# Patient Record
Sex: Female | Born: 1950
Health system: Southern US, Community
[De-identification: ages and names within clinical notes are randomized; demographics above are authoritative.]

## PROBLEM LIST (undated history)

## (undated) DIAGNOSIS — F32A Depression, unspecified: Secondary | ICD-10-CM

## (undated) DIAGNOSIS — J449 Chronic obstructive pulmonary disease, unspecified: Secondary | ICD-10-CM

## (undated) DIAGNOSIS — J189 Pneumonia, unspecified organism: Secondary | ICD-10-CM

## (undated) DIAGNOSIS — I219 Acute myocardial infarction, unspecified: Secondary | ICD-10-CM

## (undated) DIAGNOSIS — R51 Headache: Secondary | ICD-10-CM

## (undated) DIAGNOSIS — I10 Essential (primary) hypertension: Secondary | ICD-10-CM

## (undated) DIAGNOSIS — R519 Headache, unspecified: Secondary | ICD-10-CM

## (undated) DIAGNOSIS — F329 Major depressive disorder, single episode, unspecified: Secondary | ICD-10-CM

## (undated) DIAGNOSIS — IMO0001 Reserved for inherently not codable concepts without codable children: Secondary | ICD-10-CM

## (undated) DIAGNOSIS — Z8719 Personal history of other diseases of the digestive system: Secondary | ICD-10-CM

## (undated) DIAGNOSIS — K219 Gastro-esophageal reflux disease without esophagitis: Secondary | ICD-10-CM

## (undated) DIAGNOSIS — F411 Generalized anxiety disorder: Secondary | ICD-10-CM

## (undated) DIAGNOSIS — D649 Anemia, unspecified: Secondary | ICD-10-CM

## (undated) DIAGNOSIS — I509 Heart failure, unspecified: Secondary | ICD-10-CM

## (undated) HISTORY — PX: CARDIAC CATHETERIZATION: SHX172

## (undated) HISTORY — PX: OTHER SURGICAL HISTORY: SHX169

---

## 2014-11-26 DIAGNOSIS — J441 Chronic obstructive pulmonary disease with (acute) exacerbation: Secondary | ICD-10-CM | POA: Diagnosis not present

## 2014-11-26 DIAGNOSIS — I214 Non-ST elevation (NSTEMI) myocardial infarction: Secondary | ICD-10-CM | POA: Diagnosis not present

## 2014-12-10 DIAGNOSIS — Z79899 Other long term (current) drug therapy: Secondary | ICD-10-CM | POA: Diagnosis not present

## 2014-12-10 DIAGNOSIS — R109 Unspecified abdominal pain: Secondary | ICD-10-CM | POA: Diagnosis not present

## 2014-12-10 DIAGNOSIS — R51 Headache: Secondary | ICD-10-CM | POA: Diagnosis not present

## 2014-12-10 DIAGNOSIS — T07 Unspecified multiple injuries: Secondary | ICD-10-CM | POA: Diagnosis not present

## 2014-12-10 DIAGNOSIS — R21 Rash and other nonspecific skin eruption: Secondary | ICD-10-CM | POA: Diagnosis not present

## 2014-12-10 DIAGNOSIS — Z5181 Encounter for therapeutic drug level monitoring: Secondary | ICD-10-CM | POA: Diagnosis not present

## 2014-12-10 DIAGNOSIS — Z043 Encounter for examination and observation following other accident: Secondary | ICD-10-CM | POA: Diagnosis not present

## 2014-12-10 DIAGNOSIS — R1030 Lower abdominal pain, unspecified: Secondary | ICD-10-CM | POA: Diagnosis not present

## 2014-12-10 DIAGNOSIS — T797XXA Traumatic subcutaneous emphysema, initial encounter: Secondary | ICD-10-CM | POA: Diagnosis not present

## 2014-12-10 DIAGNOSIS — I252 Old myocardial infarction: Secondary | ICD-10-CM | POA: Diagnosis not present

## 2014-12-10 DIAGNOSIS — M542 Cervicalgia: Secondary | ICD-10-CM | POA: Diagnosis not present

## 2014-12-10 DIAGNOSIS — Z7982 Long term (current) use of aspirin: Secondary | ICD-10-CM | POA: Diagnosis not present

## 2014-12-10 DIAGNOSIS — R55 Syncope and collapse: Secondary | ICD-10-CM | POA: Diagnosis not present

## 2014-12-10 DIAGNOSIS — R918 Other nonspecific abnormal finding of lung field: Secondary | ICD-10-CM | POA: Diagnosis not present

## 2014-12-10 DIAGNOSIS — T1490 Injury, unspecified: Secondary | ICD-10-CM | POA: Diagnosis not present

## 2014-12-10 DIAGNOSIS — J9611 Chronic respiratory failure with hypoxia: Secondary | ICD-10-CM | POA: Diagnosis not present

## 2014-12-10 DIAGNOSIS — J439 Emphysema, unspecified: Secondary | ICD-10-CM | POA: Diagnosis not present

## 2014-12-11 DIAGNOSIS — T797XXA Traumatic subcutaneous emphysema, initial encounter: Secondary | ICD-10-CM | POA: Diagnosis not present

## 2014-12-11 DIAGNOSIS — R1030 Lower abdominal pain, unspecified: Secondary | ICD-10-CM | POA: Diagnosis not present

## 2014-12-11 DIAGNOSIS — Z043 Encounter for examination and observation following other accident: Secondary | ICD-10-CM | POA: Diagnosis not present

## 2014-12-11 DIAGNOSIS — R51 Headache: Secondary | ICD-10-CM | POA: Diagnosis not present

## 2014-12-11 DIAGNOSIS — T1490 Injury, unspecified: Secondary | ICD-10-CM | POA: Diagnosis not present

## 2014-12-11 DIAGNOSIS — R918 Other nonspecific abnormal finding of lung field: Secondary | ICD-10-CM | POA: Diagnosis not present

## 2014-12-11 DIAGNOSIS — R55 Syncope and collapse: Secondary | ICD-10-CM | POA: Diagnosis not present

## 2014-12-11 DIAGNOSIS — M542 Cervicalgia: Secondary | ICD-10-CM | POA: Diagnosis not present

## 2014-12-20 DIAGNOSIS — Z043 Encounter for examination and observation following other accident: Secondary | ICD-10-CM | POA: Diagnosis not present

## 2014-12-21 DIAGNOSIS — Z043 Encounter for examination and observation following other accident: Secondary | ICD-10-CM | POA: Diagnosis not present

## 2015-01-01 DIAGNOSIS — J432 Centrilobular emphysema: Secondary | ICD-10-CM | POA: Diagnosis not present

## 2015-01-01 DIAGNOSIS — F419 Anxiety disorder, unspecified: Secondary | ICD-10-CM | POA: Diagnosis not present

## 2015-01-01 DIAGNOSIS — J9611 Chronic respiratory failure with hypoxia: Secondary | ICD-10-CM | POA: Diagnosis not present

## 2015-01-01 DIAGNOSIS — J449 Chronic obstructive pulmonary disease, unspecified: Secondary | ICD-10-CM | POA: Diagnosis not present

## 2015-01-01 DIAGNOSIS — Z720 Tobacco use: Secondary | ICD-10-CM | POA: Diagnosis not present

## 2015-01-03 DIAGNOSIS — Z79899 Other long term (current) drug therapy: Secondary | ICD-10-CM | POA: Diagnosis not present

## 2015-01-03 DIAGNOSIS — R918 Other nonspecific abnormal finding of lung field: Secondary | ICD-10-CM | POA: Diagnosis not present

## 2015-01-03 DIAGNOSIS — Z8659 Personal history of other mental and behavioral disorders: Secondary | ICD-10-CM | POA: Diagnosis not present

## 2015-01-03 DIAGNOSIS — F515 Nightmare disorder: Secondary | ICD-10-CM | POA: Diagnosis not present

## 2015-01-03 DIAGNOSIS — J449 Chronic obstructive pulmonary disease, unspecified: Secondary | ICD-10-CM | POA: Diagnosis not present

## 2015-01-03 DIAGNOSIS — Z780 Asymptomatic menopausal state: Secondary | ICD-10-CM | POA: Diagnosis not present

## 2015-01-03 DIAGNOSIS — R636 Underweight: Secondary | ICD-10-CM | POA: Diagnosis not present

## 2015-01-15 DIAGNOSIS — Z720 Tobacco use: Secondary | ICD-10-CM | POA: Diagnosis not present

## 2015-01-15 DIAGNOSIS — Z79899 Other long term (current) drug therapy: Secondary | ICD-10-CM | POA: Diagnosis not present

## 2015-01-15 DIAGNOSIS — R918 Other nonspecific abnormal finding of lung field: Secondary | ICD-10-CM | POA: Diagnosis not present

## 2015-01-15 DIAGNOSIS — C3480 Malignant neoplasm of overlapping sites of unspecified bronchus and lung: Secondary | ICD-10-CM | POA: Diagnosis not present

## 2015-01-15 DIAGNOSIS — J449 Chronic obstructive pulmonary disease, unspecified: Secondary | ICD-10-CM | POA: Diagnosis not present

## 2015-01-28 DIAGNOSIS — J9611 Chronic respiratory failure with hypoxia: Secondary | ICD-10-CM | POA: Diagnosis present

## 2015-01-28 DIAGNOSIS — Z87891 Personal history of nicotine dependence: Secondary | ICD-10-CM | POA: Diagnosis not present

## 2015-01-28 DIAGNOSIS — J449 Chronic obstructive pulmonary disease, unspecified: Secondary | ICD-10-CM | POA: Diagnosis not present

## 2015-01-28 DIAGNOSIS — J441 Chronic obstructive pulmonary disease with (acute) exacerbation: Secondary | ICD-10-CM | POA: Diagnosis not present

## 2015-01-28 DIAGNOSIS — T80818A Extravasation of other vesicant agent, initial encounter: Secondary | ICD-10-CM | POA: Diagnosis not present

## 2015-01-28 DIAGNOSIS — R05 Cough: Secondary | ICD-10-CM | POA: Diagnosis not present

## 2015-01-28 DIAGNOSIS — R64 Cachexia: Secondary | ICD-10-CM | POA: Diagnosis not present

## 2015-01-28 DIAGNOSIS — J42 Unspecified chronic bronchitis: Secondary | ICD-10-CM | POA: Diagnosis not present

## 2015-01-28 DIAGNOSIS — I252 Old myocardial infarction: Secondary | ICD-10-CM | POA: Diagnosis not present

## 2015-01-28 DIAGNOSIS — F419 Anxiety disorder, unspecified: Secondary | ICD-10-CM | POA: Diagnosis not present

## 2015-01-28 DIAGNOSIS — R918 Other nonspecific abnormal finding of lung field: Secondary | ICD-10-CM | POA: Diagnosis not present

## 2015-01-28 DIAGNOSIS — Z681 Body mass index (BMI) 19 or less, adult: Secondary | ICD-10-CM | POA: Diagnosis not present

## 2015-01-28 DIAGNOSIS — R000 Tachycardia, unspecified: Secondary | ICD-10-CM | POA: Diagnosis not present

## 2015-01-28 DIAGNOSIS — R509 Fever, unspecified: Secondary | ICD-10-CM | POA: Diagnosis not present

## 2015-01-28 DIAGNOSIS — R0789 Other chest pain: Secondary | ICD-10-CM | POA: Diagnosis not present

## 2015-01-28 DIAGNOSIS — R0602 Shortness of breath: Secondary | ICD-10-CM | POA: Diagnosis not present

## 2015-01-28 DIAGNOSIS — J439 Emphysema, unspecified: Secondary | ICD-10-CM | POA: Diagnosis not present

## 2015-01-28 DIAGNOSIS — I4891 Unspecified atrial fibrillation: Secondary | ICD-10-CM | POA: Diagnosis not present

## 2015-01-28 DIAGNOSIS — R079 Chest pain, unspecified: Secondary | ICD-10-CM | POA: Diagnosis not present

## 2015-01-28 DIAGNOSIS — T148 Other injury of unspecified body region: Secondary | ICD-10-CM | POA: Diagnosis not present

## 2015-02-25 DIAGNOSIS — Z659 Problem related to unspecified psychosocial circumstances: Secondary | ICD-10-CM | POA: Diagnosis not present

## 2015-02-25 DIAGNOSIS — F329 Major depressive disorder, single episode, unspecified: Secondary | ICD-10-CM | POA: Diagnosis not present

## 2015-04-07 DIAGNOSIS — R000 Tachycardia, unspecified: Secondary | ICD-10-CM | POA: Diagnosis not present

## 2015-04-07 DIAGNOSIS — Z659 Problem related to unspecified psychosocial circumstances: Secondary | ICD-10-CM | POA: Diagnosis not present

## 2015-04-07 DIAGNOSIS — R636 Underweight: Secondary | ICD-10-CM | POA: Diagnosis not present

## 2015-04-07 DIAGNOSIS — J449 Chronic obstructive pulmonary disease, unspecified: Secondary | ICD-10-CM | POA: Diagnosis not present

## 2015-04-21 DIAGNOSIS — R918 Other nonspecific abnormal finding of lung field: Secondary | ICD-10-CM | POA: Diagnosis not present

## 2015-04-21 DIAGNOSIS — J432 Centrilobular emphysema: Secondary | ICD-10-CM | POA: Diagnosis not present

## 2015-05-02 DIAGNOSIS — Z87891 Personal history of nicotine dependence: Secondary | ICD-10-CM | POA: Diagnosis not present

## 2015-05-02 DIAGNOSIS — J439 Emphysema, unspecified: Secondary | ICD-10-CM | POA: Diagnosis not present

## 2015-05-02 DIAGNOSIS — L03211 Cellulitis of face: Secondary | ICD-10-CM | POA: Diagnosis not present

## 2015-05-02 DIAGNOSIS — I509 Heart failure, unspecified: Secondary | ICD-10-CM | POA: Diagnosis not present

## 2015-05-02 DIAGNOSIS — Z880 Allergy status to penicillin: Secondary | ICD-10-CM | POA: Diagnosis not present

## 2015-05-02 DIAGNOSIS — R51 Headache: Secondary | ICD-10-CM | POA: Diagnosis not present

## 2015-08-18 ENCOUNTER — Observation Stay (HOSPITAL_BASED_OUTPATIENT_CLINIC_OR_DEPARTMENT_OTHER)
Admission: EM | Admit: 2015-08-18 | Discharge: 2015-08-22 | Disposition: A | Discharge status: Home / Self Care | Payer: Medicare Other | Source: Home / Self Care | Attending: Student in an Organized Health Care Education/Training Program | Admitting: Student in an Organized Health Care Education/Training Program

## 2015-08-18 ENCOUNTER — Encounter (HOSPITAL_COMMUNITY): Payer: Self-pay | Admitting: Emergency Medicine

## 2015-08-18 ENCOUNTER — Emergency Department (HOSPITAL_COMMUNITY): Payer: Medicare Other

## 2015-08-18 DIAGNOSIS — R062 Wheezing: Secondary | ICD-10-CM | POA: Diagnosis not present

## 2015-08-18 DIAGNOSIS — E43 Unspecified severe protein-calorie malnutrition: Secondary | ICD-10-CM | POA: Insufficient documentation

## 2015-08-18 DIAGNOSIS — F419 Anxiety disorder, unspecified: Secondary | ICD-10-CM

## 2015-08-18 DIAGNOSIS — I272 Other secondary pulmonary hypertension: Secondary | ICD-10-CM | POA: Diagnosis not present

## 2015-08-18 DIAGNOSIS — I160 Hypertensive urgency: Secondary | ICD-10-CM | POA: Diagnosis not present

## 2015-08-18 DIAGNOSIS — R0602 Shortness of breath: Secondary | ICD-10-CM

## 2015-08-18 DIAGNOSIS — J441 Chronic obstructive pulmonary disease with (acute) exacerbation: Secondary | ICD-10-CM | POA: Diagnosis present

## 2015-08-18 DIAGNOSIS — R918 Other nonspecific abnormal finding of lung field: Secondary | ICD-10-CM

## 2015-08-18 DIAGNOSIS — J9621 Acute and chronic respiratory failure with hypoxia: Secondary | ICD-10-CM | POA: Diagnosis not present

## 2015-08-18 DIAGNOSIS — J9622 Acute and chronic respiratory failure with hypercapnia: Secondary | ICD-10-CM | POA: Diagnosis not present

## 2015-08-18 DIAGNOSIS — I959 Hypotension, unspecified: Secondary | ICD-10-CM | POA: Diagnosis not present

## 2015-08-18 DIAGNOSIS — R069 Unspecified abnormalities of breathing: Secondary | ICD-10-CM | POA: Diagnosis not present

## 2015-08-18 DIAGNOSIS — I10 Essential (primary) hypertension: Secondary | ICD-10-CM

## 2015-08-18 DIAGNOSIS — F4312 Post-traumatic stress disorder, chronic: Secondary | ICD-10-CM

## 2015-08-18 DIAGNOSIS — E872 Acidosis: Secondary | ICD-10-CM | POA: Diagnosis not present

## 2015-08-18 DIAGNOSIS — R64 Cachexia: Secondary | ICD-10-CM | POA: Diagnosis not present

## 2015-08-18 HISTORY — DX: Acute myocardial infarction, unspecified: I21.9

## 2015-08-18 HISTORY — DX: Essential (primary) hypertension: I10

## 2015-08-18 HISTORY — DX: Anemia, unspecified: D64.9

## 2015-08-18 HISTORY — DX: Chronic obstructive pulmonary disease, unspecified: J44.9

## 2015-08-18 HISTORY — DX: Headache, unspecified: R51.9

## 2015-08-18 HISTORY — DX: Reserved for inherently not codable concepts without codable children: IMO0001

## 2015-08-18 HISTORY — DX: Heart failure, unspecified: I50.9

## 2015-08-18 HISTORY — DX: Generalized anxiety disorder: F41.1

## 2015-08-18 HISTORY — DX: Headache: R51

## 2015-08-18 HISTORY — DX: Depression, unspecified: F32.A

## 2015-08-18 HISTORY — DX: Gastro-esophageal reflux disease without esophagitis: K21.9

## 2015-08-18 HISTORY — DX: Major depressive disorder, single episode, unspecified: F32.9

## 2015-08-18 HISTORY — DX: Personal history of other diseases of the digestive system: Z87.19

## 2015-08-18 LAB — BASIC METABOLIC PANEL
Anion gap: 14 (ref 5–15)
BUN: 6 mg/dL (ref 6–20)
CHLORIDE: 101 mmol/L (ref 101–111)
CO2: 21 mmol/L — AB (ref 22–32)
CREATININE: 0.65 mg/dL (ref 0.44–1.00)
Calcium: 8.9 mg/dL (ref 8.9–10.3)
GFR calc non Af Amer: >60 mL/min (ref >60)
GLUCOSE: 94 mg/dL (ref 65–99)
Potassium: 3.8 mmol/L (ref 3.5–5.1)
Sodium: 136 mmol/L (ref 135–145)

## 2015-08-18 LAB — CBC WITH DIFFERENTIAL/PLATELET
Basophils Absolute: 0 10*3/uL (ref 0.0–0.1)
Basophils Relative: 0 %
Eosinophils Absolute: 0 10*3/uL (ref 0.0–0.7)
Eosinophils Relative: 0 %
HEMATOCRIT: 36.8 % (ref 36.0–46.0)
HEMOGLOBIN: 12.7 g/dL (ref 12.0–15.0)
LYMPHS ABS: 1.5 10*3/uL (ref 0.7–4.0)
LYMPHS PCT: 16 %
MCH: 32.9 pg (ref 26.0–34.0)
MCHC: 34.5 g/dL (ref 30.0–36.0)
MCV: 95.3 fL (ref 78.0–100.0)
MONO ABS: 1.4 10*3/uL — AB (ref 0.1–1.0)
MONOS PCT: 15 %
NEUTROS ABS: 6.2 10*3/uL (ref 1.7–7.7)
NEUTROS PCT: 69 %
Platelets: 329 10*3/uL (ref 150–400)
RBC: 3.86 MIL/uL — ABNORMAL LOW (ref 3.87–5.11)
RDW: 11.9 % (ref 11.5–15.5)
WBC: 9.2 10*3/uL (ref 4.0–10.5)

## 2015-08-18 LAB — I-STAT TROPONIN, ED: Troponin i, poc: 0 ng/mL (ref 0.00–0.08)

## 2015-08-18 MED ORDER — SODIUM CHLORIDE 0.9 % IV BOLUS (SEPSIS)
1000.0000 mL | Freq: Once | INTRAVENOUS | Status: AC
  Administered 2015-08-18: 1000 mL via INTRAVENOUS

## 2015-08-18 MED ORDER — KETOROLAC TROMETHAMINE 30 MG/ML IJ SOLN
30.0000 mg | Freq: Once | INTRAMUSCULAR | Status: AC
  Administered 2015-08-18: 30 mg via INTRAVENOUS
  Filled 2015-08-18: qty 1

## 2015-08-18 MED ORDER — IPRATROPIUM BROMIDE 0.02 % IN SOLN
0.5000 mg | Freq: Once | RESPIRATORY_TRACT | Status: DC
Start: 2015-08-18 — End: 2015-08-19
  Filled 2015-08-18: qty 2.5

## 2015-08-18 MED ORDER — ALBUTEROL SULFATE (2.5 MG/3ML) 0.083% IN NEBU
5.0000 mg | INHALATION_SOLUTION | Freq: Once | RESPIRATORY_TRACT | Status: DC
  Filled 2015-08-18: qty 6

## 2015-08-18 MED ORDER — PREDNISONE 20 MG PO TABS
60.0000 mg | ORAL_TABLET | Freq: Once | ORAL | Status: AC
  Administered 2015-08-18: 60 mg via ORAL
  Filled 2015-08-18: qty 3

## 2015-08-18 MED ORDER — ACETAMINOPHEN 500 MG PO TABS
1000.0000 mg | ORAL_TABLET | Freq: Once | ORAL | Status: AC
Start: 2015-08-18 — End: 2015-08-18
  Administered 2015-08-18: 1000 mg via ORAL
  Filled 2015-08-18: qty 2

## 2015-08-18 NOTE — ED Provider Notes (Signed)
CSN: [id number]     Arrival date & time 08/18/15  1954 History   First MD Initiated Contact with Patient 08/18/15 2005     Chief Complaint  Patient presents with  . Shortness of Breath     (Consider location/radiation/quality/duration/timing/severity/associated sxs/prior Treatment) HPI Comments: Patient is a 64 year old female with a past medical history of COPD and hypertension who presents with SOB that started about 3 weeks ago and acutely worsened prior to arrival. Patient reports she has been having increased productive coughing with green sputum and wheezing since the weather has been changing. Tonight she acutely worsened while she was watching her grandchildren. Patient reports chest tightness and SOB. She reports associated wheezing and coughing as well. She called EMS and received two albuterol nebulizer treatments en route and reports some relief. No aggravating factors. No other associated symptoms.    History reviewed. No pertinent past medical history. History reviewed. No pertinent past surgical history. History reviewed. No pertinent family history. Social History  Substance Use Topics  . Smoking status: None  . Smokeless tobacco: None  . Alcohol Use: None   OB History    No data available     Review of Systems  Respiratory: Positive for cough, shortness of breath and wheezing.   All other systems reviewed and are negative.     Allergies  Review of patient's allergies indicates not on file.  Home Medications   Prior to Admission medications   Not on File   BP 151/57 mmHg  Pulse 12  Temp(Src) 98.4 F (36.9 C) (Oral)  SpO2 99% Physical Exam  Constitutional: She is oriented to person, place, and time. She appears well-developed and well-nourished. No distress.  HENT:  Head: Normocephalic and atraumatic.  Eyes: Conjunctivae and EOM are normal.  Neck: Normal range of motion.  Cardiovascular: Regular rhythm.  Exam reveals no gallop and no friction rub.    No murmur heard. tachycardic  Pulmonary/Chest: She is in respiratory distress. She has no wheezes. She has no rales. She exhibits no tenderness.  Increased breathing effort. Diminished lung sounds in all fields.   Abdominal: Soft. She exhibits no distension. There is no tenderness. There is no rebound.  Musculoskeletal: Normal range of motion.  Neurological: She is alert and oriented to person, place, and time. Coordination normal.  Speech is goal-oriented. Moves limbs without ataxia.   Skin: Skin is warm and dry.  Psychiatric: She has a normal mood and affect. Her behavior is normal.  Nursing note and vitals reviewed.   ED Course  Procedures (including critical care time) Labs Review Labs Reviewed  CBC WITH DIFFERENTIAL/PLATELET - Abnormal; Notable for the following:    RBC 3.86 (*)    Monocytes Absolute 1.4 (*)    All other components within normal limits  BASIC METABOLIC PANEL - Abnormal; Notable for the following:    CO2 21 (*)    All other components within normal limits  I-STAT TROPOININ, ED    Imaging Review Dg Chest 2 View  08/18/2015  CLINICAL DATA:  Acute onset of shortness of breath for 1 week. Initial encounter. EXAM: CHEST  2 VIEW COMPARISON:  None. FINDINGS: The lungs are hyperexpanded, with flattening of the hemidiaphragms, compatible with COPD. Bilateral nipple shadows are note. Mild peribronchial thickening is seen. There is no evidence of pleural effusion or pneumothorax. The heart is normal in size; the mediastinal contour is within normal limits. No acute osseous abnormalities are seen. IMPRESSION: Findings of COPD.  No acute cardiopulmonary  process identified. Electronically Signed   By: Garald Balding M.D.   On: 08/18/2015 21:19   I have personally reviewed and evaluated these images and lab results as part of my medical decision-making.   EKG Interpretation   Date/Time:  Monday August 18 2015 20:04:37 EDT Ventricular Rate:  118 PR Interval:  164 QRS  Duration: 90 QT Interval:  349 QTC Calculation: 489 R Axis:   88 Text Interpretation:  Sinus tachycardia Right atrial enlargement  Borderline right axis deviation RSR' in V1 or V2, probably normal variant  Borderline ST elevation, anterior leads Borderline prolonged QT interval  Artifact in lead(s) I III aVL V2 V6 Confirmed by Lacinda Axon  MD, BRIAN ([id number])  on 08/18/2015 8:24:59 PM      MDM   Final diagnoses:  COPD with exacerbation ([place])    8:22 PM Labs and chest xray pending. Patient will have albuterol nebulizer, prednisone, and fluids.   No acute lab or chest xray changes. Patient will be admitted for COPD exacerbation.    Alvina Chou, PA-C 08/19/15 [id number]  Nat Christen, MD 08/19/15 [phone]

## 2015-08-18 NOTE — ED Notes (Signed)
Pt states she has been having trouble breathing for about a week. Pt has with a history of COPD and has been taking her home meds with no relief. Today her breathing had gotten worse, and she felt she should come to the ER for treatment. Pt has received two breathing treatments by EMS enroute to hospital ( '10mg'$  Albuterol). Patient has gotten some relief from treatments.

## 2015-08-19 ENCOUNTER — Encounter (HOSPITAL_COMMUNITY): Payer: Self-pay | Admitting: *Deleted

## 2015-08-19 DIAGNOSIS — I10 Essential (primary) hypertension: Secondary | ICD-10-CM

## 2015-08-19 DIAGNOSIS — E43 Unspecified severe protein-calorie malnutrition: Secondary | ICD-10-CM | POA: Insufficient documentation

## 2015-08-19 DIAGNOSIS — R918 Other nonspecific abnormal finding of lung field: Secondary | ICD-10-CM

## 2015-08-19 DIAGNOSIS — J441 Chronic obstructive pulmonary disease with (acute) exacerbation: Secondary | ICD-10-CM | POA: Diagnosis present

## 2015-08-19 HISTORY — DX: Essential (primary) hypertension: I10

## 2015-08-19 MED ORDER — KETOROLAC TROMETHAMINE 30 MG/ML IJ SOLN
15.0000 mg | Freq: Four times a day (QID) | INTRAMUSCULAR | Status: AC | PRN
  Administered 2015-08-20: 15 mg via INTRAVENOUS
  Filled 2015-08-19: qty 1

## 2015-08-19 MED ORDER — ALPRAZOLAM 0.25 MG PO TABS
0.2500 mg | ORAL_TABLET | Freq: Two times a day (BID) | ORAL | Status: DC | PRN
  Administered 2015-08-19 – 2015-08-21 (×5): 0.25 mg via ORAL
  Filled 2015-08-19 (×5): qty 1

## 2015-08-19 MED ORDER — GUAIFENESIN-CODEINE 100-10 MG/5ML PO SOLN: 5.0000 mL | ORAL | Status: DC | PRN

## 2015-08-19 MED ORDER — IPRATROPIUM-ALBUTEROL 0.5-2.5 (3) MG/3ML IN SOLN
3.0000 mL | Freq: Four times a day (QID) | RESPIRATORY_TRACT | Status: DC
  Administered 2015-08-19 (×2): 3 mL via RESPIRATORY_TRACT
  Filled 2015-08-19 (×2): qty 3

## 2015-08-19 MED ORDER — PROMETHAZINE HCL 25 MG PO TABS
12.5000 mg | ORAL_TABLET | ORAL | Status: DC | PRN
  Administered 2015-08-19 – 2015-08-20 (×3): 12.5 mg via ORAL
  Filled 2015-08-19 (×4): qty 1

## 2015-08-19 MED ORDER — IPRATROPIUM-ALBUTEROL 0.5-2.5 (3) MG/3ML IN SOLN
3.0000 mL | RESPIRATORY_TRACT | Status: DC
  Administered 2015-08-19 – 2015-08-22 (×19): 3 mL via RESPIRATORY_TRACT
  Filled 2015-08-19 (×20): qty 3

## 2015-08-19 MED ORDER — ALBUTEROL SULFATE (2.5 MG/3ML) 0.083% IN NEBU: 2.5000 mg | INHALATION_SOLUTION | RESPIRATORY_TRACT | Status: DC | PRN

## 2015-08-19 MED ORDER — ESCITALOPRAM OXALATE 20 MG PO TABS
20.0000 mg | ORAL_TABLET | Freq: Every day | ORAL | Status: DC
  Administered 2015-08-19 – 2015-08-22 (×4): 20 mg via ORAL
  Filled 2015-08-19 (×4): qty 1

## 2015-08-19 MED ORDER — GUAIFENESIN-DM 100-10 MG/5ML PO SYRP: 5.0000 mL | ORAL_SOLUTION | ORAL | Status: DC | PRN

## 2015-08-19 MED ORDER — AZITHROMYCIN 500 MG PO TABS
500.0000 mg | ORAL_TABLET | Freq: Every day | ORAL | Status: AC
  Administered 2015-08-19: 500 mg via ORAL
  Filled 2015-08-19 (×2): qty 1

## 2015-08-19 MED ORDER — MOMETASONE FURO-FORMOTEROL FUM 200-5 MCG/ACT IN AERO
2.0000 | INHALATION_SPRAY | Freq: Two times a day (BID) | RESPIRATORY_TRACT | Status: DC
  Administered 2015-08-19 (×2): 2 via RESPIRATORY_TRACT
  Filled 2015-08-19: qty 8.8

## 2015-08-19 MED ORDER — ENOXAPARIN SODIUM 30 MG/0.3ML ~~LOC~~ SOLN
20.0000 mg | SUBCUTANEOUS | Status: DC
  Administered 2015-08-19: 20 mg via SUBCUTANEOUS
  Filled 2015-08-19: qty 0.3

## 2015-08-19 MED ORDER — ACETAMINOPHEN 325 MG PO TABS
650.0000 mg | ORAL_TABLET | Freq: Four times a day (QID) | ORAL | Status: DC | PRN
  Administered 2015-08-19 – 2015-08-21 (×4): 650 mg via ORAL
  Filled 2015-08-19 (×4): qty 2

## 2015-08-19 MED ORDER — ENSURE ENLIVE PO LIQD
237.0000 mL | Freq: Two times a day (BID) | ORAL | Status: DC
  Administered 2015-08-19 – 2015-08-22 (×4): 237 mL via ORAL

## 2015-08-19 MED ORDER — AZITHROMYCIN 500 MG PO TABS
250.0000 mg | ORAL_TABLET | Freq: Every day | ORAL | Status: DC
  Administered 2015-08-20 – 2015-08-22 (×3): 250 mg via ORAL
  Filled 2015-08-19 (×2): qty 1

## 2015-08-19 MED ORDER — RAMELTEON 8 MG PO TABS
8.0000 mg | ORAL_TABLET | Freq: Every evening | ORAL | Status: DC | PRN
  Administered 2015-08-19: 8 mg via ORAL
  Filled 2015-08-19 (×2): qty 1

## 2015-08-19 MED ORDER — LISINOPRIL 2.5 MG PO TABS
2.5000 mg | ORAL_TABLET | Freq: Every day | ORAL | Status: DC
  Administered 2015-08-19 – 2015-08-22 (×4): 2.5 mg via ORAL
  Filled 2015-08-19 (×4): qty 1

## 2015-08-19 MED ORDER — POLYETHYLENE GLYCOL 3350 17 G PO PACK
17.0000 g | PACK | Freq: Every day | ORAL | Status: DC
  Administered 2015-08-19 – 2015-08-22 (×4): 17 g via ORAL
  Filled 2015-08-19 (×4): qty 1

## 2015-08-19 MED ORDER — AZITHROMYCIN 500 MG PO TABS
500.0000 mg | ORAL_TABLET | Freq: Every day | ORAL | Status: DC
  Filled 2015-08-19: qty 1

## 2015-08-19 MED ORDER — DOXYCYCLINE HYCLATE 100 MG PO TABS: 100.0000 mg | ORAL_TABLET | Freq: Two times a day (BID) | ORAL | Status: DC

## 2015-08-19 MED ORDER — ASPIRIN EC 81 MG PO TBEC
81.0000 mg | DELAYED_RELEASE_TABLET | Freq: Every day | ORAL | Status: DC
  Administered 2015-08-19 – 2015-08-22 (×4): 81 mg via ORAL
  Filled 2015-08-19 (×4): qty 1

## 2015-08-19 MED ORDER — PREDNISONE 50 MG PO TABS
60.0000 mg | ORAL_TABLET | Freq: Every day | ORAL | Status: AC
  Administered 2015-08-19 – 2015-08-22 (×4): 60 mg via ORAL
  Filled 2015-08-19 (×8): qty 1

## 2015-08-19 NOTE — Progress Notes (Signed)
Subjective: Megan [name].  She reports improved breathing and cough, which is decreasingly productive.  She has not gotten out of bed, but feels well enough to walk today.  However, she does not feel strong enough to go home.  She is interested in transitioning her care to [facility].  Objective: Vital signs in last 24 hours: Filed Vitals:   08/19/15 0334 08/19/15 0500 08/19/15 0811 08/19/15 0858  BP:  125/89 143/89   Pulse:  107 104 100  Temp:  98.4 F (36.9 C) 98.6 F (37 C)   TempSrc:  Oral Oral   Resp:  '19 20 20  '$ Height:      Weight:      SpO2: 98% 100% 98% 98%   Weight change:   Intake/Output Summary (Last 24 hours) at 08/19/15 0926 Last data filed at 08/19/15 0600  Gross per 24 hour  Intake    360 ml  Output      0 ml  Net    360 ml   Physical Exam  Constitutional: She is oriented to person, place, and time.  Thin, appears older than stated age.  Sitting in bed, NAD.  HENT:  Head: Normocephalic and atraumatic.  Eyes: EOM are normal.  Cardiovascular: Normal rate, regular rhythm and normal heart sounds.   Pulmonary/Chest: Effort normal.  Decreased breath sounds throughout.  Minimal scattered coarse inspiratory sounds.  No wheezes appreciated.  Abdominal: Soft. She exhibits no distension. There is no tenderness. There is no rebound and no guarding.  Musculoskeletal: She exhibits no edema.  Neurological: She is alert and oriented to person, place, and time.  Skin: Skin is warm and dry. No rash noted.    Lab Results: Basic Metabolic Panel:  Recent Labs Lab 08/18/15 2121  NA 136  K 3.8  CL 101  CO2 21*  GLUCOSE 94  BUN 6  CREATININE 0.65  CALCIUM 8.9   Liver Function Tests: No results for input(s): AST, ALT, ALKPHOS, BILITOT, PROT, ALBUMIN in the last 168 hours. No results for input(s): LIPASE, AMYLASE in the last 168 hours. No results for input(s): AMMONIA in the last 168 hours. CBC:  Recent Labs Lab 08/18/15 2121  WBC 9.2  NEUTROABS 6.2  HGB 12.7    HCT 36.8  MCV 95.3  PLT 329   Cardiac Enzymes: No results for input(s): CKTOTAL, CKMB, CKMBINDEX, TROPONINI in the last 168 hours. BNP: No results for input(s): PROBNP in the last 168 hours. D-Dimer: No results for input(s): DDIMER in the last 168 hours. CBG: No results for input(s): GLUCAP in the last 168 hours. Hemoglobin A1C: No results for input(s): HGBA1C in the last 168 hours. Fasting Lipid Panel: No results for input(s): CHOL, HDL, LDLCALC, TRIG, CHOLHDL, LDLDIRECT in the last 168 hours. Thyroid Function Tests: No results for input(s): TSH, T4TOTAL, FREET4, T3FREE, THYROIDAB in the last 168 hours. Coagulation: No results for input(s): LABPROT, INR in the last 168 hours. Anemia Panel: No results for input(s): VITAMINB12, FOLATE, FERRITIN, TIBC, IRON, RETICCTPCT in the last 168 hours. Urine Drug Screen: Drugs of Abuse  No results found for: LABOPIA, COCAINSCRNUR, LABBENZ, AMPHETMU, THCU, LABBARB  Alcohol Level: No results for input(s): ETH in the last 168 hours. Urinalysis: No results for input(s): COLORURINE, LABSPEC, PHURINE, GLUCOSEU, HGBUR, BILIRUBINUR, KETONESUR, PROTEINUR, UROBILINOGEN, NITRITE, LEUKOCYTESUR in the last 168 hours.  Invalid input(s): APPERANCEUR Misc. Labs:   Micro Results: No results found for this or any previous visit (from the past 240 hour(s)). Studies/Results: Dg Chest 2 View  08/18/2015  CLINICAL DATA:  Acute onset of shortness of breath for 1 week. Initial encounter. EXAM: CHEST  2 VIEW COMPARISON:  None. FINDINGS: The lungs are hyperexpanded, with flattening of the hemidiaphragms, compatible with COPD. Bilateral nipple shadows are note. Mild peribronchial thickening is seen. There is no evidence of pleural effusion or pneumothorax. The heart is normal in size; the mediastinal contour is within normal limits. No acute osseous abnormalities are seen. IMPRESSION: Findings of COPD.  No acute cardiopulmonary process identified. Electronically  Signed   By: Garald Balding M.D.   On: 08/18/2015 21:19   Medications: I have reviewed the patient's current medications. Scheduled Meds: . aspirin EC  81 mg Oral Daily  . azithromycin  500 mg Oral Daily   Followed by  . [START ON 08/20/2015] azithromycin  250 mg Oral Daily  . enoxaparin (LOVENOX) injection  20 mg Subcutaneous Q24H  . feeding supplement (ENSURE ENLIVE)  237 mL Oral BID BM  . ipratropium-albuterol  3 mL Nebulization Q4H  . lisinopril  2.5 mg Oral Daily  . mometasone-formoterol  2 puff Inhalation BID  . predniSONE  60 mg Oral Q breakfast   Continuous Infusions:  PRN Meds:.guaiFENesin-codeine, ramelteon   Assessment/Plan: Principal Problem:   COPD with acute exacerbation (HCC) Active Problems:   Multiple pulmonary nodules determined by computed tomography of lung   Essential hypertension  Ms. Leisinger is a very pleasant 64 year old woman with a past medical history of COPD, HTN, PTSD, and anxiety who presents with worsening shortness of breath.   COPD Exacerbation: Likely brought on by URI due to recent sick contacts. PFTs from [facility] in March 2016 demonstrate FEV1/FVC ~40% whyperith FEV1 21% predicted. 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 No evidence of pneumonia on chest X-ray and no suspicion for PE at this time.  - Duoneb q4h - Dulera 2 puffs BID - Azithromycin '500mg'$  once and 250 mg daily on discharge indefinitely - Guaifenesin-Codeine q4h prn cough and rib pain - Prednisone 60 mg x4 days, 40 mg x4 days, and 20 mg daily on discharge indefinitely  Pulmonary Nodules on CT: Previously seen in [place] and seen again by Pulmonologists at [facility], with concern for NSCLC.  CT guided biopsy and PET scan was recommended, but have not been performed.  Patient desires to transition care to [facility] and we will refer her for follow up. - Pulmonary/Oncology multidisciplinary clinic referral.  PTSD: Escitalopram 20 mg daily  HTN:  Controlled.  - Continue home ASA and lisinopril 2.5 mg  Insomnia: Reports taking unknown dose of melatonin at home. However, she reports that insomnia is a persistent problem for her - Not on formulary, so will use melatonin agonist ramelteon - Consider mirtazapine as this can stimulate appetite and treat insomnia  Weight Loss: Likely related to emphysema and chronically increased respiratory effort. Treated with cyproheptadine as an outpatient.  Concern for underlying malignancy given pulmonary nodules c/f NSCLC. Will refer for pulmonary evaluation. - Ensure  DVT Prophylaxis: Lovenox  Code Status: Full  Dispo: Disposition is deferred at this time, awaiting improvement of current medical problems.  Anticipated discharge in approximately 1-2 day(s).   The patient does not have a current PCP (Pcp Not In System) and does need an [facility] hospital follow-up appointment after discharge.  The patient does not have transportation limitations that hinder transportation to clinic appointments.  .Services Needed at time of discharge: Y = Yes, Blank = No PT:  OT:   RN:   Equipment:   Other:     LOS: 0 days   Iline Oven, MD 08/19/2015, 9:26 AM

## 2015-08-19 NOTE — Progress Notes (Signed)
Initial Nutrition Assessment  DOCUMENTATION CODES:   Severe malnutrition in context of chronic illness, Underweight  INTERVENTION:   Continue Ensure Enlive po BID, each supplement provides 350 kcal and 20 grams of protein.  Encourage adequate PO intake.   NUTRITION DIAGNOSIS:   Malnutrition related to chronic illness as evidenced by severe depletion of body fat, severe depletion of muscle mass.  GOAL:   Patient will meet greater than or equal to 90% of their needs  MONITOR:   PO intake, Supplement acceptance, Weight trends, Labs, I & O's  REASON FOR ASSESSMENT:    (Low BMI)    ASSESSMENT:   64 year old woman with a past medical history of COPD, HTN, PTSD, and anxiety who presents with worsening shortness of breath.  Pt reports appetite has been good. Meal completion has been 100%. PTA pt reports eating fine with at least 2-3 meals daily. Pt reports usual body weight has been ~100 lbs which she reports weighing ~1 year ago. Pt reports she has been trying to gain her weight back however has been difficult. Pt currently has Ensure ordered and has been consuming them. RD to continue with current orders. Pt was educated to consume high calorie/high protein foods to aid in weight gains and to not restrict her diet. Pt encouraged to continue nutritional supplementation post discharge. Pt expressed understanding.   Nutrition-Focused physical exam completed. Findings are severe fat depletion, severe muscle depletion, and no edema.   Labs and medications reviewed.   Diet Order:  Diet regular Room service appropriate?: Yes; Fluid consistency:: Thin  Skin:  Reviewed, no issues  Last BM:  10/17  Height:   Ht Readings from Last 1 Encounters:  08/19/15 5' 2.5" (1.588 m)    Weight:   Wt Readings from Last 1 Encounters:  08/19/15 93 lb 1.6 oz (42.23 kg)    Ideal Body Weight:  51 kg  BMI:  Body mass index is 16.75 kg/(m^2).  Estimated Nutritional Needs:   Kcal:   1500-1700  Protein:  65-80 grams  Fluid:  1.5 - 1.7 L/day  EDUCATION NEEDS:   Education needs addressed  Corrin Parker, MS, RD, LDN Pager # [phone] After hours/ weekend pager # [phone]

## 2015-08-19 NOTE — Discharge Summary (Signed)
Name: Megan [name] MRN: [id number] DOB: 18-Aug-1951 64 y.o. PCP: Pcp Not In System  Date of Admission: 08/18/2015  7:54 PM Date of Discharge: 08/22/2015 Attending Physician: Axel Filler, MD  Discharge Diagnosis: 1. COPD exacerbation   Principal Problem:   COPD with acute exacerbation ([place]) Active Problems:   Multiple pulmonary nodules determined by computed tomography of lung   Essential hypertension   COPD exacerbation (HCC)   Protein-calorie malnutrition, severe   Anxiety state   Chronic post-traumatic stress disorder (PTSD)  Discharge Medications:   Medication List    TAKE these medications        acetaminophen 500 MG tablet  Commonly known as:  TYLENOL  Take 500 mg by mouth every 6 (six) hours as needed for mild pain.     albuterol 108 (90 BASE) MCG/ACT inhaler  Commonly known as:  PROVENTIL HFA;VENTOLIN HFA  Inhale 1 puff into the lungs every 4 (four) hours as needed for wheezing or shortness of breath.     azithromycin 250 MG tablet  Commonly known as:  ZITHROMAX  Take 1 tablet (250 mg total) by mouth daily.     busPIRone 7.5 MG tablet  Commonly known as:  BUSPAR  Take 1 tablet (7.5 mg total) by mouth 2 (two) times daily.     butalbital-acetaminophen-caffeine 50-325-40 MG tablet  Commonly known as:  FIORICET  Take 1-2 tablets by mouth every 6 (six) hours as needed for headache.     Fluticasone-Salmeterol 500-50 MCG/DOSE Aepb  Commonly known as:  ADVAIR  Inhale 1 puff into the lungs 2 (two) times daily.     lisinopril 2.5 MG tablet  Commonly known as:  PRINIVIL,ZESTRIL  Take 2.5 mg by mouth daily.     predniSONE 20 MG tablet  Commonly known as:  DELTASONE  10/22-10/25: Take (40 mg) 2 tablets daily; 10/26: take (20 mg) 1 tablet daily until followup     sertraline 50 MG tablet  Commonly known as:  ZOLOFT  Take 50 mg by mouth 2 (two) times daily.     Umeclidinium Bromide 62.5 MCG/INH Aepb  Commonly known as:  INCRUSE ELLIPTA  Inhale 1 puff  into the lungs daily.        Disposition and follow-up:   Ms.Purity Krouse was discharged from [facility] in Stable condition.  At the hospital follow up visit please address:  1.  Medications, cardiac and pulmonary rehab, follow up with Pulmonology regarding lung nodules  2.  Labs / imaging needed at time of follow-up: possibly CT chest and PET pending Pulmonology evaluation  3.  Pending labs/ test needing follow-up: none  Follow-up Appointments: Follow-up Information    Follow up with Osa Craver, MD On 09/02/2015.   Specialty:  Internal Medicine   Why:  3:15 pm   Contact information:   [address] [place] [id number] [phone]       Follow up with Marshell Garfinkel, MD On 08/02/2015.   Specialty:  Pulmonary Disease   Why:  10:45am   Contact information:   [address] [place] [address] [phone]       Discharge Instructions: Discharge Instructions    AMB referral to pulmonary rehabilitation    Complete by:  As directed   Pulmonary Rehabilitation (COPD Diagnosis): - COPD-Gold 1 is NOT covered. Please consider Pulmonary Maintenance. - POST Spirometry or PFT is REQUIRED with the referral per Medicare guidelines. - FEV1 < 80% and FEV1/FVC < 70% are REQUIRED for  COPD diagnosis.  COPD-Gold 2: Moderate - 50% </= FEV1 based on post-bronchodilator results COPD-Gold 3: Severe - 30% </= FEV1 based on post-bronchodilator results COPD-Gold 4: Very Severe - FEV </= 30% based on post-bronchodilator results  Program Details: Programs include 24-36 sessions, 2 to 3 times per week including: cardiovascular exercise, strength training, education on ADL, chronic lung disease management and medical nutrition therapy. Services provided by RT, EP and RN. 2 office will be notified by phone, fax or mail of any O2 adjustment.  Physician Certification: I certify that the above treatment is medically necessary and is medically approved by  me for treatment of this patient. The patient may be advanced through the program and as seen appropriate by the program Medical Director, staff and/or myself.  Please select a program:  Pulmonary Rehabilitation (COPD)  COPD Diagnosis: (See requirements below):  COPD-Gold 4     Amb Referral to Cardiac Rehabilitation    Complete by:  As directed   Diagnosis:  Other Comment - COPD, pulmonary hypertension     Diet - low sodium heart healthy    Complete by:  As directed      Increase activity slowly    Complete by:  As directed            Consultations:    Procedures Performed:  Dg Chest 2 View  08/18/2015  CLINICAL DATA:  Acute onset of shortness of breath for 1 week. Initial encounter. EXAM: CHEST  2 VIEW COMPARISON:  None. FINDINGS: The lungs are hyperexpanded, with flattening of the hemidiaphragms, compatible with COPD. Bilateral nipple shadows are note. Mild peribronchial thickening is seen. There is no evidence of pleural effusion or pneumothorax. The heart is normal in size; the mediastinal contour is within normal limits. No acute osseous abnormalities are seen. IMPRESSION: Findings of COPD.  No acute cardiopulmonary process identified. Electronically Signed   By: Garald Balding M.D.   On: 08/18/2015 21:19    2D Echo: none  Cardiac Cath: none  Admission HPI: [name] is a very pleasant 64 year old woman with a past medical history of COPD, HTN, PTSD, and anxiety who presents with worsening shortness of breath. About three days ago, she noticed that she could not walk very far without needing to rest, requiring increasing her home O2 from 2L to 2.5L. This was associated with a product cough with yellow sputum, wheezing, subjective fever, and alternative chills and sweats. She thought she could take some prednisone that she had obtained from a previous hospitalization, but it was only minimally effective. She also started using her albuterol nebulizer four times a day instead of the  usual once or twice. She had been adherent with her Advair and Incruse Ellipta. She reports close contact with her grandchildren who had recently gone to the doctor for a cold. With her coughing, she has had new onset left rib pain, for which she is requesting morphine, and a headache. Other associated symptoms are light-headedness and nausea. She also describes vision changes and eye pain over the past several months, reporting that when she looks at bright lights, it appears at though they are surrounded by "sparklers." She denies loss of consciousness, new weakness or sensory changes, sore throat, chest pain, calf pain or tenderness, vomiting or diarrhea, or any recent falls. She reports being in a generally good mood. The patient received her flu shot and first pneumonia immunization.  She is in the process of moving from [place] to [place], and says that  she has had at least three hospitalization for COPD exacerbations in the past year at [place], [place], and [facility]. She recalls requiring antibiotics and IV steroids at those hospitalizations and receiving pulmonary function testing. She said that before she came to [facility], she only needed her O2 at night, but now requires it all the time.  Patient currently is staying at home with her daughter. Her PTSD manifested three years ago after witnessing the suicide of her son. She is a former smoker and former drinker.   Hospital Course by problem list: Principal Problem:   COPD with acute exacerbation ([place]) Active Problems:   Multiple pulmonary nodules determined by computed tomography of lung   Essential hypertension   COPD exacerbation (HCC)   Protein-calorie malnutrition, severe   Anxiety state   Chronic post-traumatic stress disorder (PTSD)   COPD Exacerbation: Patient presented with SOB and increased sputum production and purulence.  She was started on scheduled Duonebs, Prednisone, and Azithromycin.  She improved daily, with decreased SOB, sputum  production, and wheezing.  She was amble to ambulate without oxygen and maintain oxygen saturations >88%, improving to 97% on 2L [place] (her home regimen).  She was discharged on Azithromycin 250 mg daily and Prednisone taper (40 mg daily for 4 days, and 20 mg daily indefinitely).   Tachycardia on exertion was sinus tach without right heart strain or wall motion abnormality on echo.  She was found to have pulmonary hypertension, likely 2/2 longstanding COPD.  She was scheduled for follow up in IM clinic and pulmonology upon discharge.   Pulmonary Nodules: Multiple pulmonary nodules found on CT while patient was living in [place].  She was re-evaluated at [facility], who was concerned for NSCLC and recommended CT-guided biopsy and PET scan.  Patient has not had those studies performed and desires to establish care through [facility].  She was referred to [place], and has follow up scheduled with Dr. Vaughan Browner.   Deconditioning: Patient worked with PT while admitted.  She was discharged with referrals to Pulmonary and Cardiac rehab.  Discharge Vitals:   BP 122/78 mmHg  Pulse 102  Temp(Src) 97.7 F (36.5 C) (Oral)  Resp 21  Ht 5' 2.5" (1.588 m)  Wt 95 lb 14.4 oz (43.5 kg)  BMI 17.25 kg/m2  SpO2 95%  Discharge Labs:  Results for orders placed or performed during the hospital encounter of 08/18/15 (from the past 24 hour(s))  HIV antibody     Status: None   Collection Time: 08/22/15  3:30 AM  Result Value Ref Range   HIV Screen 4th Generation wRfx Non Reactive Non Reactive  Hepatic function panel     Status: None   Collection Time: 08/22/15  3:30 AM  Result Value Ref Range   Total Protein 6.8 6.5 - 8.1 g/dL   Albumin 3.5 3.5 - 5.0 g/dL   AST 27 15 - 41 U/L   ALT 18 14 - 54 U/L   Alkaline Phosphatase 71 38 - 126 U/L   Total Bilirubin 0.8 0.3 - 1.2 mg/dL   Bilirubin, Direct 0.3 0.1 - 0.5 mg/dL   Indirect Bilirubin 0.5 0.3 - 0.9 mg/dL  Basic metabolic panel     Status: Abnormal   Collection Time:  08/22/15 10:10 AM  Result Value Ref Range   Sodium 138 135 - 145 mmol/L   Potassium 4.7 3.5 - 5.1 mmol/L   Chloride 89 (L) 101 - 111 mmol/L   CO2 35 (H) 22 - 32 mmol/L   Glucose, Bld  93 65 - 99 mg/dL   BUN 14 6 - 20 mg/dL   Creatinine, Ser 0.72 0.44 - 1.00 mg/dL   Calcium 10.1 8.9 - 10.3 mg/dL   GFR calc non Af Amer >60 >60 mL/min   GFR calc Af Amer >60 >60 mL/min   Anion gap 14 5 - 15    Signed: Iline Oven, MD 08/22/2015, 7:24 PM    Services Ordered on Discharge: cardiac rehab, pulmonary rehab Equipment Ordered on Discharge: none

## 2015-08-19 NOTE — Evaluation (Signed)
Physical Therapy Evaluation Patient Details Name: Megan [name] MRN: [id number] DOB: 1951/07/27 Today's Date: 08/19/2015   History of Present Illness  64 year old woman with progressive gold stage 4D COPD comes to the emergency department with several days of increasing dyspnea with exertion. She has had at least 2 other COPD exacerbations in the last year requiring hospitalization.   Clinical Impression  *Pt admitted with above. Pt mobility greatly limited by severe SOB, SpO2 at 93% however HR 130's after amb 50'. Pt mobility greatly limited by cardiopulmonary condition. Acute PT to follow to address energy conservation and improve activity tolerance.    Follow Up Recommendations Supervision - Intermittent (cardiac rehab when appropriate)    Equipment Recommendations   (potentially RW)    Recommendations for Other Services       Precautions / Restrictions Precautions Precautions: Other (comment) (severe SOB) Precaution Comments: on 2LO2 via [place] Restrictions Weight Bearing Restrictions: No      Mobility  Bed Mobility Overal bed mobility: Modified Independent                Transfers Overall transfer level: Needs assistance Equipment used: None Transfers: Sit to/from Stand Sit to Stand: Supervision         General transfer comment: assist for O2 tank  Ambulation/Gait Ambulation/Gait assistance: Min assist;Min guard Ambulation Distance (Feet): 50 Feet Assistive device: None Gait Pattern/deviations: Step-through pattern Gait velocity: progrsesively slower   General Gait Details: pt initial amb WFL but then at 30' onset of severe SOB and weakness requiring HHA by PT  Stairs            Wheelchair Mobility    Modified Rankin (Stroke Patients Only)       Balance Overall balance assessment: No apparent balance deficits (not formally assessed)                                           Pertinent Vitals/Pain Pain Assessment: No/denies  pain    Home Living Family/patient expects to be discharged to:: Private residence Living Arrangements: Children Available Help at Discharge: Family;Available 24 hours/day Type of Home: House Home Access: Stairs to enter Entrance Stairs-Rails: None Entrance Stairs-Number of Steps: 2 Home Layout: 1/2 bath on main level Home Equipment: None      Prior Function Level of Independence: Independent         Comments: pt visiting daughter from [place]: Right    Extremity/Trunk Assessment   Upper Extremity Assessment: Overall WFL for tasks assessed           Lower Extremity Assessment: Overall WFL for tasks assessed      Cervical / Trunk Assessment: Normal  Communication   Communication: No difficulties  Cognition Arousal/Alertness: Awake/alert Behavior During Therapy: WFL for tasks assessed/performed Overall Cognitive Status: Within Functional Limits for tasks assessed                      General Comments General comments (skin integrity, edema, etc.): discussed energy conservation techniques, ie sitting while bathing, brushing teeth, spacing activities out to conserve energy    Exercises        Assessment/Plan    PT Assessment Patient needs continued PT services  PT Diagnosis Difficulty walking   PT Problem List Cardiopulmonary status limiting activity  PT Treatment Interventions DME instruction;Gait training;Stair training;Functional mobility training;Therapeutic  activities;Therapeutic exercise   PT Goals (Current goals can be found in the Care Plan section) Acute Rehab PT Goals Patient Stated Goal: get better ASAP PT Goal Formulation: With patient Time For Goal Achievement: 08/26/15 Potential to Achieve Goals: Good Additional Goals Additional Goal #1: Pt to utilize energy conservation strategies for ADLs.    Frequency Min 2X/week   Barriers to discharge        Co-evaluation               End of  Session Equipment Utilized During Treatment: Oxygen (2Lo2 via [place]) Activity Tolerance: Patient limited by fatigue Patient left: in bed;with call bell/phone within reach Nurse Communication: Mobility status    Functional Assessment Tool Used: clinical judgement Functional Limitation: Mobility: Walking and moving around Mobility: Walking and Moving Around Current Status (T7322): At least 20 percent but less than 40 percent impaired, limited or restricted Mobility: Walking and Moving Around Goal Status [phone]): At least 1 percent but less than 20 percent impaired, limited or restricted    Time: [id number] PT Time Calculation (min) (ACUTE ONLY): 12 min   Charges:   PT Evaluation $Initial PT Evaluation Tier I: 1 Procedure     PT G Codes:   PT G-Codes **NOT FOR INPATIENT CLASS** Functional Assessment Tool Used: clinical judgement Functional Limitation: Mobility: Walking and moving around Mobility: Walking and Moving Around Current Status (G3151): At least 20 percent but less than 40 percent impaired, limited or restricted Mobility: Walking and Moving Around Goal Status [phone]): At least 1 percent but less than 20 percent impaired, limited or restricted    Kingsley Callander 08/19/2015, 4:59 PM   Kittie Plater, PT, DPT Pager #: [phone] Office #: [phone]

## 2015-08-19 NOTE — Progress Notes (Signed)
Utilization review completed. Makenze Ellett, RN, BSN. 

## 2015-08-19 NOTE — H&P (Signed)
Date: 08/19/2015               Patient Name:  Megan [name] MRN: [id number]  DOB: [birth year] Age / Sex: 64 y.o., female   PCP: Dr. Lynder Parents, MD         Medical Service: Internal Medicine Teaching Service         Attending Physician: Dr. Axel Filler, MD    First Contact: Dr. Viviano Simas, MD Pager: [phone]  Second Contact: Dr. Michail Jewels, ND Pager: [phone]       After Hours (After 5p/  First Contact Pager: [phone]  weekends / holidays): Second Contact Pager: [phone]   Chief Complaint: Shortness of Breath  History of Present Illness:   Megan [name] is a very pleasant 64 year old woman with a past medical history of COPD, HTN, PTSD, and anxiety who presents with worsening shortness of breath. About three days ago, she noticed that she could not walk very far without needing to rest, requiring increasing her home O2 from 2L to 2.5L. This was associated with a product cough with yellow sputum, wheezing, subjective fever, and alternative chills and sweats. She thought she could take some prednisone that she had obtained from a previous hospitalization, but it was only minimally effective. She also started using her albuterol nebulizer four times a day instead of the usual once or twice. She had been adherent with her Advair and Incruse Ellipta. She reports close contact with her grandchildren who had recently gone to the doctor for a cold. With her coughing, she has had new onset left rib pain, for which she is requesting morphine, and a headache. Other associated symptoms are light-headedness and nausea. She also describes vision changes and eye pain over the past several months, reporting that when she looks at bright lights, it appears at though they are surrounded by "sparklers." She denies loss of consciousness, new weakness or sensory changes, sore throat, chest pain, calf pain or tenderness, vomiting or diarrhea, or any recent falls. She reports being in a generally good  mood. The patient received her flu shot and first pneumonia immunization.  She is in the process of moving from [place] to [place], and says that she has had at least three hospitalization for COPD exacerbations in the past year at [place], [place], and [facility]. She recalls requiring antibiotics and IV steroids at those hospitalizations and receiving pulmonary function testing. She said that before she came to [facility], she only needed her O2 at night, but now requires it all the time.  Patient currently is staying at home with her daughter. Her PTSD manifested three years ago after witnessing the suicide of her son. She is a former smoker and former drinker.   Meds: Current Facility-Administered Medications  Medication Dose Route Frequency Provider Last Rate Last Dose  . albuterol (PROVENTIL) (2.5 MG/3ML) 0.083% nebulizer solution 5 mg  5 mg Nebulization Once [employer], PA-C   Stopped at 08/18/15 2122  . ipratropium (ATROVENT) nebulizer solution 0.5 mg  0.5 mg Nebulization Once Alvina Chou, PA-C   Stopped at 08/18/15 2123   Current Outpatient Prescriptions  Medication Sig Dispense Refill  . acetaminophen (TYLENOL) 500 MG tablet Take 500 mg by mouth every 6 (six) hours as needed for mild pain.    Marland Kitchen albuterol (PROVENTIL HFA;VENTOLIN HFA) 108 (90 BASE) MCG/ACT inhaler Inhale 1 puff into the lungs every 6 (six) hours as needed for wheezing or shortness of breath.    . Fluticasone-Salmeterol (ADVAIR) 500-50  MCG/DOSE AEPB Inhale 1 puff into the lungs 2 (two) times daily.    Marland Kitchen lisinopril (PRINIVIL,ZESTRIL) 2.5 MG tablet Take 2.5 mg by mouth daily.    . sertraline (ZOLOFT) 50 MG tablet Take 50 mg by mouth 2 (two) times daily.    Marland Kitchen Umeclidinium Bromide (INCRUSE ELLIPTA) 62.5 MCG/INH AEPB Inhale 1 puff into the lungs daily.      Allergies: Allergies as of 08/18/2015 - Review Complete 08/18/2015  Allergen Reaction Noted  . Penicillins Rash 08/18/2015   History reviewed. No pertinent past medical  history. History reviewed. No pertinent past surgical history. History reviewed. No pertinent family history. Social History   Social History  . Marital Status: Widowed    Spouse Name: N/A  . Number of Children: N/A  . Years of Education: N/A   Occupational History  . Not on file.   Social History Main Topics  . Smoking status: Not on file  . Smokeless tobacco: Not on file  . Alcohol Use: Not on file  . Drug Use: Not on file  . Sexual Activity: Not on file   Other Topics Concern  . Not on file   Social History Narrative  . No narrative on file    Review of Systems: Negative except per HPI  Physical Exam: Blood pressure 137/69, pulse 98, temperature 98.4 F (36.9 C), temperature source Oral, resp. rate 22, SpO2 99 %. General:  Thin appearing woman lying in bed, no acute distress HEENT: EOMI, PERRL, Moist mucous membranes, no tonsillar erythema or exudates Cardiovascular: Mildly tachycardic. Regular rate and rhythm without murmurs, rubs, or gallops Pulmonary: Diminished breaths sound and shallow breathing. Expiratory wheezes in all lung fields.  Abdomen: Soft, Nontender, Non-distended. Normal bowel sounds Musculoskeletal: Tenderness to palpation of left ribs without evidence of fracture Extremities: No clubbing, cyanosis, or edema. 2+ DP pulses Neurological: AAOx3. Face symmetric. Tongue midline. Shrugs shoulders symmetrically. 5/5 strength in all extremities.  Lab results: Basic Metabolic Panel:  Recent Labs  08/18/15 2121  NA 136  K 3.8  CL 101  CO2 21*  GLUCOSE 94  BUN 6  CREATININE 0.65  CALCIUM 8.9   CBC:  Recent Labs  08/18/15 2121  WBC 9.2  NEUTROABS 6.2  HGB 12.7  HCT 36.8  MCV 95.3  PLT 329    Imaging results:  Dg Chest 2 View  08/18/2015  CLINICAL DATA:  Acute onset of shortness of breath for 1 week. Initial encounter. EXAM: CHEST  2 VIEW COMPARISON:  None. FINDINGS: The lungs are hyperexpanded, with flattening of the hemidiaphragms,  compatible with COPD. Bilateral nipple shadows are note. Mild peribronchial thickening is seen. There is no evidence of pleural effusion or pneumothorax. The heart is normal in size; the mediastinal contour is within normal limits. No acute osseous abnormalities are seen. IMPRESSION: Findings of COPD.  No acute cardiopulmonary process identified. Electronically Signed   By: Garald Balding M.D.   On: 08/18/2015 21:19    EKG: Sinus tachycardia Right atrial enlargement Borderline right axis deviation RSR' in V1 or V2, probably normal variant Borderline ST elevation, anterior leads Borderline prolonged QT interval Artifact in lead(s) I III aVL V2 V6  Assessment & Plan by Problem:  COPD Exacerbation: Likely brought on by URI due to recent sick contacts. PFTs from [place] in March 2016 demonstrate FEV1/FVC ~40% with FEV1 21% predicted. 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 No evidence of pneumonia on chest X-ray and no suspicion  for PE at this time.  - Duoneb q6h - Dulera 2 puffs BID - Azirthromycin 500 mg qday for 5 days - Guaifenesin-Codeine q4h prn cough and rib pain - Prednisone 60 mg daily - May consider palliative care consult, but likely defer for now until living situation has settled  PTSD:  Patient reports taking escitalopram for this, but Care Everywhere records indicate sertraline. She says that it is helpful in controlling her PTSD symptoms.  - Will defer restarting SSRI until the correct dosage or drug is known, although consider starting on a low dose to prevent SSRI withdrawal syndrome  HTN:  Controlled at 137/69.  - Continue home ASA and lisinopril 2.5 mg  Insomnia:  Reports taking unknown dose of melatonin at home. However, she reports that insomnia is a persistent problem for her - Not on formulary, so will use melatonin agonist ramelteon - Consider mirtazapine as this can stimulate appetite and treat insomnia  Weight Loss: Likely  related to emphysema and chronically increased respiratory effort. Treated with cyproheptadine as an outpatient. - Ensure  DVT Prophylaxis: Lovenox  Code Status: Full  Dispo: Disposition is deferred at this time, awaiting improvement of current medical problems. Anticipated discharge in approximately 2-3 day(s).   The patient does have a current PCP (Lynder Parents, MD) and does not need an [facility] hospital follow-up appointment after discharge.  The patient does have transportation limitations that hinder transportation to clinic appointments.  Signed: Liberty Handy, MD 08/19/2015, 12:49 AM

## 2015-08-19 NOTE — ED Notes (Signed)
Report attempted. No answer on unit.

## 2015-08-20 ENCOUNTER — Encounter (HOSPITAL_COMMUNITY): Payer: Self-pay | Admitting: Internal Medicine

## 2015-08-20 DIAGNOSIS — F4312 Post-traumatic stress disorder, chronic: Secondary | ICD-10-CM

## 2015-08-20 DIAGNOSIS — J441 Chronic obstructive pulmonary disease with (acute) exacerbation: Secondary | ICD-10-CM | POA: Diagnosis not present

## 2015-08-20 DIAGNOSIS — F411 Generalized anxiety disorder: Secondary | ICD-10-CM

## 2015-08-20 DIAGNOSIS — J9621 Acute and chronic respiratory failure with hypoxia: Secondary | ICD-10-CM | POA: Diagnosis not present

## 2015-08-20 DIAGNOSIS — E43 Unspecified severe protein-calorie malnutrition: Secondary | ICD-10-CM | POA: Diagnosis not present

## 2015-08-20 DIAGNOSIS — I959 Hypotension, unspecified: Secondary | ICD-10-CM | POA: Diagnosis not present

## 2015-08-20 DIAGNOSIS — R64 Cachexia: Secondary | ICD-10-CM | POA: Diagnosis not present

## 2015-08-20 DIAGNOSIS — F419 Anxiety disorder, unspecified: Secondary | ICD-10-CM

## 2015-08-20 HISTORY — DX: Generalized anxiety disorder: F41.1

## 2015-08-20 LAB — CBC
HCT: 37.5 % (ref 36.0–46.0)
HEMOGLOBIN: 12.5 g/dL (ref 12.0–15.0)
MCH: 32.8 pg (ref 26.0–34.0)
MCHC: 33.3 g/dL (ref 30.0–36.0)
MCV: 98.4 fL (ref 78.0–100.0)
PLATELETS: 326 10*3/uL (ref 150–400)
RBC: 3.81 MIL/uL — AB (ref 3.87–5.11)
RDW: 11.9 % (ref 11.5–15.5)
WBC: 8.1 10*3/uL (ref 4.0–10.5)

## 2015-08-20 MED ORDER — BUDESONIDE 0.25 MG/2ML IN SUSP
0.2500 mg | Freq: Two times a day (BID) | RESPIRATORY_TRACT | Status: DC
Start: 2015-08-20 — End: 2015-08-21
  Administered 2015-08-20 (×2): 0.25 mg via RESPIRATORY_TRACT
  Filled 2015-08-20 (×2): qty 2

## 2015-08-20 MED ORDER — KETOROLAC TROMETHAMINE 30 MG/ML IJ SOLN
30.0000 mg | Freq: Once | INTRAMUSCULAR | Status: AC
  Administered 2015-08-21: 30 mg via INTRAVENOUS
  Filled 2015-08-20: qty 1

## 2015-08-20 MED ORDER — GUAIFENESIN ER 600 MG PO TB12
600.0000 mg | ORAL_TABLET | Freq: Two times a day (BID) | ORAL | Status: DC
  Administered 2015-08-20 – 2015-08-21 (×4): 600 mg via ORAL
  Filled 2015-08-20 (×4): qty 1

## 2015-08-20 MED ORDER — CALCIUM CITRATE-VITAMIN D 500-400 MG-UNIT PO CHEW
1.0000 | CHEWABLE_TABLET | Freq: Two times a day (BID) | ORAL | Status: DC
  Filled 2015-08-20 (×2): qty 1

## 2015-08-20 MED ORDER — CALCIUM CARBONATE-VITAMIN D 500-200 MG-UNIT PO TABS
1.0000 | ORAL_TABLET | Freq: Two times a day (BID) | ORAL | Status: DC
  Administered 2015-08-20 – 2015-08-22 (×5): 1 via ORAL
  Filled 2015-08-20 (×5): qty 1

## 2015-08-20 NOTE — Progress Notes (Signed)
SATURATION QUALIFICATIONS:   Patient Saturations on Room Air at Rest = 94% Patient Saturations on Room Air Standing: 90% Patient Saturations on [employer] while Ambulating = 89% Patient Saturations on 2 Liters of oxygen while Ambulating = 97%   Megan [name]

## 2015-08-20 NOTE — Consult Note (Signed)
August 20, 2015 Pharmacy  Pharmacy Students rounding with [facility]. As the team is considering initiating long-term corticosteroids to reduce the number of COPD exacerbations, the question arose as to appropriate prophylactic measures with proton-pump inhibitors (PPI) for GI prophylaxis and bisphosphonates for glucocorticoid induced osteoporosis (GIO). Currently, data supports only the use of bisphosphonates (with [phone] mg/d of calcium and 250-400 IU/d of vitamin D) as fracture prophylaxis.   Review of current literature regarding PPI use estimates that the risk of a glucocorticoid induced peptic ulcer is less than 0.4-1.8%. Additionally, inappropriate use of PPIs has been associated with a 74% higher risk of C. diff and a 2- to 3-fold increase in AKI.   Information pertaining to osteoporosis prophylaxis is more promising. Data suggests that risedronate or alendronate (dosed at 35 mg/week or 70 mg/week, respectively) are associated with a statistically significant increase in bone mineral density (BMD) at 12 months (risedronate BMD 3.8% higher versus placebo in lumbar spine and 4.1% higher in the femoral neck; alendronate BMD 2.69% higher in lumbar spine and 1.41% higher in total hip BMD). With these data, it is justifiable to initiate bisphosphonate therapy in a patient being treated with glucocorticoids.   Trinna Balloon. Daje Stark, PharmD Candidate and Aura Fey. March Rummage, PharmD Candidate  References:  Dorlo TP, Jager NG, Beijen [facility], et al. Concomitant use of proton pump inhibitors and systemic corticosteroids. [employer]. 2013; 157 (19).  Jones MG, Tsega S, Cho HJ. Inappropriate Prescription of Proton Pump Inhibitors in the Setting of Steroid Use. [place] doi:10.1001/jamainternmed.8206.0156  Winona Legato JD. Glucocorticoid-Induced Osteoporosis: Treatment Update and Review. Ther Adv Musculoskelet Dis. 2009; 1 (2): 71-85.

## 2015-08-20 NOTE — Progress Notes (Signed)
Subjective: Megan [name].  She reports unchanged cough, still productive of green sputum.  She says she can feel her heart pounding when she walks to the bathroom or ambulates with PT.  She still feels "junky" and does not think she can go home. Her daughter is not currently in [place], as she had to travel to [place] for the death of a family member.   Objective: Vital signs in last 24 hours: Filed Vitals:   08/19/15 2300 08/20/15 0357 08/20/15 0451 08/20/15 0926  BP:  130/73    Pulse:  84  96  Temp:  98.3 F (36.8 C)    TempSrc:  Oral    Resp:  17  18  Height:      Weight:      SpO2: 99% 100% 98% 98%   Weight change: 13.1 oz (0.37 kg)  Intake/Output Summary (Last 24 hours) at 08/20/15 6629 Last data filed at 08/20/15 0608  Gross per 24 hour  Intake   1317 ml  Output      0 ml  Net   1317 ml   Physical Exam  Constitutional: She is oriented to person, place, and time.  Thin, appears older than stated age.  Sitting in bed, NAD.  HENT:  Head: Normocephalic and atraumatic.  Eyes: EOM are normal.  Cardiovascular: Normal rate, regular rhythm and normal heart sounds.   Pulmonary/Chest: Effort normal.  Decreased breath sounds throughout.  Minimal scattered coarse inspiratory sounds.  No wheezes appreciated.  Abdominal: Soft. She exhibits no distension. There is no tenderness. There is no rebound and no guarding.  Musculoskeletal: She exhibits no edema.  Neurological: She is alert and oriented to person, place, and time.  Skin: Skin is warm and dry. No rash noted.    Lab Results: Basic Metabolic Panel:  Recent Labs Lab 08/18/15 2121  NA 136  K 3.8  CL 101  CO2 21*  GLUCOSE 94  BUN 6  CREATININE 0.65  CALCIUM 8.9   Liver Function Tests: No results for input(s): AST, ALT, ALKPHOS, BILITOT, PROT, ALBUMIN in the last 168 hours. No results for input(s): LIPASE, AMYLASE in the last 168 hours. No results for input(s): AMMONIA in the last 168 hours. CBC:  Recent Labs Lab  08/18/15 2121 08/20/15 0411  WBC 9.2 8.1  NEUTROABS 6.2  --   HGB 12.7 12.5  HCT 36.8 37.5  MCV 95.3 98.4  PLT 329 326   Cardiac Enzymes: No results for input(s): CKTOTAL, CKMB, CKMBINDEX, TROPONINI in the last 168 hours. BNP: No results for input(s): PROBNP in the last 168 hours. D-Dimer: No results for input(s): DDIMER in the last 168 hours. CBG: No results for input(s): GLUCAP in the last 168 hours. Hemoglobin A1C: No results for input(s): HGBA1C in the last 168 hours. Fasting Lipid Panel: No results for input(s): CHOL, HDL, LDLCALC, TRIG, CHOLHDL, LDLDIRECT in the last 168 hours. Thyroid Function Tests: No results for input(s): TSH, T4TOTAL, FREET4, T3FREE, THYROIDAB in the last 168 hours. Coagulation: No results for input(s): LABPROT, INR in the last 168 hours. Anemia Panel: No results for input(s): VITAMINB12, FOLATE, FERRITIN, TIBC, IRON, RETICCTPCT in the last 168 hours. Urine Drug Screen: Drugs of Abuse  No results found for: LABOPIA, COCAINSCRNUR, LABBENZ, AMPHETMU, THCU, LABBARB  Alcohol Level: No results for input(s): ETH in the last 168 hours. Urinalysis: No results for input(s): COLORURINE, LABSPEC, PHURINE, GLUCOSEU, HGBUR, BILIRUBINUR, KETONESUR, PROTEINUR, UROBILINOGEN, NITRITE, LEUKOCYTESUR in the last 168 hours.  Invalid input(s): APPERANCEUR Misc. Labs:  Micro Results: No results found for this or any previous visit (from the past 240 hour(s)). Studies/Results: Dg Chest 2 View  08/18/2015  CLINICAL DATA:  Acute onset of shortness of breath for 1 week. Initial encounter. EXAM: CHEST  2 VIEW COMPARISON:  None. FINDINGS: The lungs are hyperexpanded, with flattening of the hemidiaphragms, compatible with COPD. Bilateral nipple shadows are note. Mild peribronchial thickening is seen. There is no evidence of pleural effusion or pneumothorax. The heart is normal in size; the mediastinal contour is within normal limits. No acute osseous abnormalities are  seen. IMPRESSION: Findings of COPD.  No acute cardiopulmonary process identified. Electronically Signed   By: Garald Balding M.D.   On: 08/18/2015 21:19   Medications: I have reviewed the patient's current medications. Scheduled Meds: . aspirin EC  81 mg Oral Daily  . azithromycin  250 mg Oral Daily  . budesonide (PULMICORT) nebulizer solution  0.25 mg Nebulization BID  . escitalopram  20 mg Oral Daily  . feeding supplement (ENSURE ENLIVE)  237 mL Oral BID BM  . guaiFENesin  600 mg Oral BID  . ipratropium-albuterol  3 mL Nebulization Q4H  . lisinopril  2.5 mg Oral Daily  . polyethylene glycol  17 g Oral Daily  . predniSONE  60 mg Oral Q breakfast   Continuous Infusions:  PRN Meds:.acetaminophen, albuterol, ALPRAZolam, ketorolac, promethazine, ramelteon   Assessment/Plan: Principal Problem:   COPD with acute exacerbation ([place]) Active Problems:   Multiple pulmonary nodules determined by computed tomography of lung   Essential hypertension   COPD exacerbation (HCC)   Protein-calorie malnutrition, severe  Megan [name] is a very pleasant 64 year old woman with a past medical history of COPD, HTN, PTSD, and anxiety who presents with worsening shortness of breath.   COPD Exacerbation: Likely brought on by URI due to recent sick contacts. PFTs from [facility] in March 2016 demonstrate FEV1/FVC ~40% whyperith FEV1 21% predicted. 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 No evidence of pneumonia on chest X-ray and no suspicion for PE at this time. Telemetry shows intermittent sinus tachycardia.  Patient's tachycardia on exertion is sinus tach, confirmed on 12 lead ECG.  Tachycardia likely 2/2 deconditioning and patient will need pulmonary and cardiac rehab on discharge. - Duoneb q4h - Dulera 2 puffs BID - Azithromycin '500mg'$  once and 250 mg daily on discharge indefinitely - Mucinex 600 mg BID - Prednisone 60 mg x4 days, 40 mg x4 days, and 20 mg daily on discharge  indefinitely - Calcium and Vit D supplementation  Pulmonary Nodules on CT: Previously seen in [place] and seen again by Pulmonologists at [facility], with concern for NSCLC.  CT guided biopsy and PET scan was recommended, but have not been performed.  Patient desires to transition care to [facility] and we will refer her for follow up. - Pulmonary/Oncology multidisciplinary clinic referral.  PTSD: Escitalopram 20 mg daily and Xanax 0.25 mg BID PRN  HTN: Controlled.  - Continue home ASA and lisinopril 2.5 mg   Insomnia: Reports taking unknown dose of melatonin at home. However, she reports that insomnia is a persistent problem for her - Not on formulary, so will use melatonin agonist ramelteon - Consider mirtazapine as this can stimulate appetite and treat insomnia  Weight Loss: Likely related to emphysema and chronically increased respiratory effort. Treated with cyproheptadine as an outpatient.  Concern for underlying malignancy given pulmonary nodules c/f NSCLC. Will refer for pulmonary evaluation. - Ensure  DVT Prophylaxis: Lovenox  Code Status: Full  Dispo: Disposition is deferred at this time, awaiting improvement of current medical problems.  Anticipated discharge in approximately 1-2 day(s).   The patient does not have a current PCP (Pcp Not In System) and does need an [facility] hospital follow-up appointment after discharge.  The patient does not have transportation limitations that hinder transportation to clinic appointments.  .Services Needed at time of discharge: Y = Yes, Blank = No PT:   OT:   RN:   Equipment:   Other:     LOS: 1 day   Iline Oven, MD 08/20/2015, 9:38 AM

## 2015-08-21 ENCOUNTER — Observation Stay (HOSPITAL_COMMUNITY): Payer: Medicare Other

## 2015-08-21 DIAGNOSIS — E43 Unspecified severe protein-calorie malnutrition: Secondary | ICD-10-CM | POA: Diagnosis not present

## 2015-08-21 DIAGNOSIS — J441 Chronic obstructive pulmonary disease with (acute) exacerbation: Secondary | ICD-10-CM | POA: Diagnosis not present

## 2015-08-21 DIAGNOSIS — R64 Cachexia: Secondary | ICD-10-CM | POA: Diagnosis not present

## 2015-08-21 DIAGNOSIS — J9621 Acute and chronic respiratory failure with hypoxia: Secondary | ICD-10-CM | POA: Diagnosis not present

## 2015-08-21 DIAGNOSIS — I959 Hypotension, unspecified: Secondary | ICD-10-CM | POA: Diagnosis not present

## 2015-08-21 MED ORDER — KETOROLAC TROMETHAMINE 30 MG/ML IJ SOLN
30.0000 mg | Freq: Once | INTRAMUSCULAR | Status: DC
  Filled 2015-08-21: qty 1

## 2015-08-21 MED ORDER — GUAIFENESIN-CODEINE 100-10 MG/5ML PO SOLN
5.0000 mL | Freq: Once | ORAL | Status: AC
  Administered 2015-08-21: 5 mL via ORAL
  Filled 2015-08-21: qty 5

## 2015-08-21 MED ORDER — ADULT MULTIVITAMIN W/MINERALS CH
1.0000 | ORAL_TABLET | Freq: Every day | ORAL | Status: DC
  Administered 2015-08-21 – 2015-08-22 (×2): 1 via ORAL
  Filled 2015-08-21 (×3): qty 1

## 2015-08-21 MED ORDER — MOMETASONE FURO-FORMOTEROL FUM 200-5 MCG/ACT IN AERO
2.0000 | INHALATION_SPRAY | Freq: Two times a day (BID) | RESPIRATORY_TRACT | Status: DC
  Administered 2015-08-21 – 2015-08-22 (×4): 2 via RESPIRATORY_TRACT
  Filled 2015-08-21: qty 8.8

## 2015-08-21 MED ORDER — KETOROLAC TROMETHAMINE 30 MG/ML IJ SOLN
30.0000 mg | Freq: Once | INTRAMUSCULAR | Status: AC
  Administered 2015-08-21: 30 mg via INTRAVENOUS
  Filled 2015-08-21: qty 1

## 2015-08-21 MED ORDER — CODEINE SULFATE 15 MG PO TABS
15.0000 mg | ORAL_TABLET | Freq: Once | ORAL | Status: DC
  Filled 2015-08-21: qty 1

## 2015-08-21 NOTE — Progress Notes (Signed)
Subjective: Megan [name].  She reports improved cough, now nonproductive.  She remains tachycardic on exertion, but RA sats remained >98% at rest and >97% on 2L.  She still does not feel like she is able to go home today, but states she will go home tomorrow.  Objective: Vital signs in last 24 hours: Filed Vitals:   08/20/15 2006 08/20/15 2322 08/21/15 0413 08/21/15 0449  BP: 147/93  105/62   Pulse: 116  108   Temp: 98.2 F (36.8 C)  98.1 F (36.7 C)   TempSrc: Oral  Oral   Resp: 17  18   Height:      Weight: 95 lb 14.4 oz (43.5 kg)     SpO2: 90% 93% 98% 98%   Weight change: 1 lb 15.7 oz (0.9 kg)  Intake/Output Summary (Last 24 hours) at 08/21/15 0850 Last data filed at 08/21/15 0618  Gross per 24 hour  Intake    960 ml  Output    200 ml  Net    760 ml   Physical Exam  Constitutional: She is oriented to person, place, and time.  Thin, appears older than stated age.  Sitting in bed, NAD.  HENT:  Head: Normocephalic and atraumatic.  Eyes: EOM are normal.  Cardiovascular: Normal rate, regular rhythm and normal heart sounds.   Pulmonary/Chest: Effort normal.  Decreased breath sounds throughout.  Prolonged expiratory phase.  Scattered wheezing, increased with forced expiration.  Abdominal: Soft. She exhibits no distension. There is no tenderness. There is no rebound and no guarding.  Musculoskeletal: She exhibits no edema.  Neurological: She is alert and oriented to person, place, and time.  Skin: Skin is warm and dry. No rash noted.    Lab Results: Basic Metabolic Panel:  Recent Labs Lab 08/18/15 2121  NA 136  K 3.8  CL 101  CO2 21*  GLUCOSE 94  BUN 6  CREATININE 0.65  CALCIUM 8.9   Liver Function Tests: No results for input(s): AST, ALT, ALKPHOS, BILITOT, PROT, ALBUMIN in the last 168 hours. No results for input(s): LIPASE, AMYLASE in the last 168 hours. No results for input(s): AMMONIA in the last 168 hours. CBC:  Recent Labs Lab 08/18/15 2121  08/20/15 0411  WBC 9.2 8.1  NEUTROABS 6.2  --   HGB 12.7 12.5  HCT 36.8 37.5  MCV 95.3 98.4  PLT 329 326   Cardiac Enzymes: No results for input(s): CKTOTAL, CKMB, CKMBINDEX, TROPONINI in the last 168 hours. BNP: No results for input(s): PROBNP in the last 168 hours. D-Dimer: No results for input(s): DDIMER in the last 168 hours. CBG: No results for input(s): GLUCAP in the last 168 hours. Hemoglobin A1C: No results for input(s): HGBA1C in the last 168 hours. Fasting Lipid Panel: No results for input(s): CHOL, HDL, LDLCALC, TRIG, CHOLHDL, LDLDIRECT in the last 168 hours. Thyroid Function Tests: No results for input(s): TSH, T4TOTAL, FREET4, T3FREE, THYROIDAB in the last 168 hours. Coagulation: No results for input(s): LABPROT, INR in the last 168 hours. Anemia Panel: No results for input(s): VITAMINB12, FOLATE, FERRITIN, TIBC, IRON, RETICCTPCT in the last 168 hours. Urine Drug Screen: Drugs of Abuse  No results found for: LABOPIA, COCAINSCRNUR, LABBENZ, AMPHETMU, THCU, LABBARB  Alcohol Level: No results for input(s): ETH in the last 168 hours. Urinalysis: No results for input(s): COLORURINE, LABSPEC, PHURINE, GLUCOSEU, HGBUR, BILIRUBINUR, KETONESUR, PROTEINUR, UROBILINOGEN, NITRITE, LEUKOCYTESUR in the last 168 hours.  Invalid input(s): APPERANCEUR Misc. Labs:   Micro Results: No results found for this  or any previous visit (from the past 240 hour(s)). Studies/Results: No results found. Medications: I have reviewed the patient's current medications. Scheduled Meds: . aspirin EC  81 mg Oral Daily  . azithromycin  250 mg Oral Daily  . budesonide (PULMICORT) nebulizer solution  0.25 mg Nebulization BID  . calcium-vitamin D  1 tablet Oral BID  . escitalopram  20 mg Oral Daily  . feeding supplement (ENSURE ENLIVE)  237 mL Oral BID BM  . guaiFENesin  600 mg Oral BID  . ipratropium-albuterol  3 mL Nebulization Q4H  . lisinopril  2.5 mg Oral Daily  . polyethylene glycol   17 g Oral Daily  . predniSONE  60 mg Oral Q breakfast   Continuous Infusions:  PRN Meds:.acetaminophen, albuterol, ALPRAZolam, promethazine, ramelteon   Assessment/Plan: Principal Problem:   COPD with acute exacerbation ([place]) Active Problems:   Multiple pulmonary nodules determined by computed tomography of lung   Essential hypertension   COPD exacerbation (HCC)   Protein-calorie malnutrition, severe   Anxiety state   Chronic post-traumatic stress disorder (PTSD)  Megan [name] is a very pleasant 64 year old woman with a past medical history of COPD, HTN, PTSD, and anxiety who presents with worsening shortness of breath.   COPD Exacerbation: Likely brought on by URI due to recent sick contacts. PFTs from [place] in March 2016 demonstrate FEV1/FVC ~40% whyperith FEV1 21% predicted. 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 No evidence of pneumonia on chest X-ray and no suspicion for PE at this time. Telemetry shows intermittent sinus tachycardia.  Patient's tachycardia on exertion is sinus tach, confirmed on 12 lead ECG.  Tachycardia likely 2/2 deconditioning and patient will need pulmonary and cardiac rehab on discharge. - Duoneb q4h - Dulera 2 puffs BID - Azithromycin '500mg'$  once and 250 mg daily on discharge indefinitely - Mucinex 600 mg BID - Prednisone 60 mg x4 days, 40 mg x4 days, and 20 mg daily on discharge indefinitely - Calcium and Vit D supplementation  Pulmonary Nodules on CT: Previously seen in [place] and seen again by Pulmonologists at [facility], with concern for NSCLC.  CT guided biopsy and PET scan was recommended, but have not been performed.  Patient desires to transition care to [facility] and we will refer her for follow up. - Pulmonary/Oncology multidisciplinary clinic referral.  PTSD: Escitalopram 20 mg daily and Xanax 0.25 mg BID PRN  HTN: Controlled.  - Continue home ASA and lisinopril 2.5 mg   Insomnia: Reports taking unknown dose  of melatonin at home. However, she reports that insomnia is a persistent problem for her - Not on formulary, so will use melatonin agonist ramelteon - Consider mirtazapine as this can stimulate appetite and treat insomnia  Weight Loss: Likely related to emphysema and chronically increased respiratory effort. Treated with cyproheptadine as an outpatient.  Concern for underlying malignancy given pulmonary nodules c/f NSCLC. Will refer for pulmonary evaluation. - Ensure  DVT Prophylaxis: Lovenox  Code Status: Full  Dispo: Disposition is deferred at this time, awaiting improvement of current medical problems.  Anticipated discharge in approximately 1-2 day(s).   The patient does not have a current PCP (Pcp Not In System) and does need an [facility] hospital follow-up appointment after discharge.  The patient does not have transportation limitations that hinder transportation to clinic appointments.  .Services Needed at time of discharge: Y = Yes, Blank = No PT:   OT:   RN:   Equipment:  Other:     LOS: 2 days   Iline Oven, MD 08/21/2015, 8:50 AM

## 2015-08-21 NOTE — Progress Notes (Signed)
PT Cancellation Note  Patient Details Name: Megan [name] MRN: [id number] DOB: Jul 26, 1951   Cancelled Treatment:    Reason Eval/Treat Not Completed: Medical issues which prohibited therapy Patient's resting heart rate while supine in bed 130s-140s. Regular rhythm with palpation. Asymptomatic. No downward trend after checking back on patient approx 30 minutes later. Not appropriate for physical therapy at this time. SpO2 95% on 2.5L supplemental O2. RN notified. Will follow-up tomorrow.  Ellouise Newer 08/21/2015, 3:28 PM Camille Bal [place], [place]

## 2015-08-21 NOTE — Progress Notes (Signed)
Pt was in respiratory distress with severe anxiety. HR 160s, RR upper 30s with abdominal breathing. Pt's RN, RRT, and MD notified. Pt placed on 55% VM for increased flow. Several minutes past, SpO2 in the 80s, pt placed on NRB with improvement of SpO2 100%. Pt calmed down after about 15 minutes and RT placed pt back on 55%VM just for flow. RN made aware. RRT and RN at bedside.

## 2015-08-21 NOTE — Progress Notes (Addendum)
Called per at 2030 Respiratory Therapist to bedside for Pt in respiratory distress with sever anxiety. RN also at bedside.Pt HR 160's, BP elevated and RR 30s. Unable to come to bedside immediately, advised floor RN to notify Pt primary MD of Pt status. Upon my arrival at 2045 Pt found resting in bed, greatly improved. Dr. Marijean Bravo paged and updated per floor RN Toradol ordered for head ache as well as tele monitoring. Pt lungs fairly clear, diminished in bases. HR 130-140s , RR 15-20 congested cough. EKG done yielding ST.  Resident Dr. Marijean Bravo at bedside to see Pt. RN advised to monitor Pt closely and notify myself and Provider. RRT will monitor.

## 2015-08-21 NOTE — Evaluation (Addendum)
Occupational Therapy Evaluation Patient Details Name: Megan [name] MRN: [id number] DOB: 27-Mar-1951 Today's Date: 08/21/2015    History of Present Illness 64 year old woman with progressive gold stage 4D COPD comes to the emergency department with several days of increasing dyspnea with exertion. She has had at least 2 other COPD exacerbations in the last year requiring hospitalization.    Clinical Impression   Pt is at Mod I level with ADLs/selfcare and sup with mobility due to O2 and SOB. Reviewed energy conservation techniques with pt and pt states that she is aware and was educated by PT and that she knows that she needs to "slow down" No further acute OT indicated at this time. Pt to continue with acute PT services for activity tolerance/emdurance and energy conservation    Follow Up Recommendations  Supervision - Intermittent;No OT follow up (cardiac rehab)    Equipment Recommendations  None recommended by OT    Recommendations for Other Services       Precautions / Restrictions Precautions Precautions: Other (comment) (SOB) Precaution Comments: on 2LO2 via [place] Restrictions Weight Bearing Restrictions: No      Mobility Bed Mobility Overal bed mobility: Modified Independent                Transfers Overall transfer level: Needs assistance Equipment used: None Transfers: Sit to/from Stand Sit to Stand: Supervision              Balance Overall balance assessment: No apparent balance deficits (not formally assessed)                                          ADL Overall ADL's : Modified independent                                       General ADL Comments: aware of using energy conservation tecniques during ADL tasks     Vision  reading glasses   Perception Perception Perception Tested?: No   Praxis Praxis Praxis tested?: Not tested    Pertinent Vitals/Pain Pain Assessment: No/denies pain     Hand  Dominance Right   Extremity/Trunk Assessment Upper Extremity Assessment Upper Extremity Assessment: Overall WFL for tasks assessed   Lower Extremity Assessment Lower Extremity Assessment: Defer to PT evaluation   Cervical / Trunk Assessment Cervical / Trunk Assessment: Normal   Communication Communication Communication: No difficulties   Cognition Arousal/Alertness: Awake/alert Behavior During Therapy: WFL for tasks assessed/performed Overall Cognitive Status: Within Functional Limits for tasks assessed                     General Comments   pt pleasant and cooperative                 Home Living Family/patient expects to be discharged to:: Private residence Living Arrangements: Children Available Help at Discharge: Family;Available 24 hours/day Type of Home: House Home Access: Stairs to enter [employer] of Steps: 2 Entrance Stairs-Rails: None Home Layout: 1/2 bath on main level     Bathroom Shower/Tub: Teacher, early years/pre: Standard     Home Equipment: Cane - single point;Walker - 4 wheels   Additional Comments: does not use DME listed above but she states that she has at home in [place]      Prior  Functioning/Environment Level of Independence: Independent        Comments: pt visiting daughter from [place]    OT Diagnosis: Generalized weakness   OT Problem List: Decreased activity tolerance   OT Treatment/Interventions:      OT Goals(Current goals can be found in the care plan section) Acute Rehab OT Goals Patient Stated Goal: get better ASAP OT Goal Formulation: With patient  OT Frequency:     Barriers to D/C:  none                        End of Session Equipment Utilized During Treatment: Oxygen  Activity Tolerance: Patient limited by fatigue Patient left: in chair;with call bell/phone within reach   Time: 0912-0937 OT Time Calculation (min): 25 min Charges:  OT General Charges $OT Visit: 1 Procedure OT  Evaluation $Initial OT Evaluation Tier I: 1 Procedure OT Treatments $Therapeutic Activity: 8-22 mins G-Codes: OT G-codes **NOT FOR INPATIENT CLASS** Functional Limitation: Self care Self Care Current Status (D6644): At least 1 percent but less than 20 percent impaired, limited or restricted  Britt Bottom 08/21/2015, 10:55 AM

## 2015-08-22 ENCOUNTER — Observation Stay (HOSPITAL_BASED_OUTPATIENT_CLINIC_OR_DEPARTMENT_OTHER): Payer: Medicare Other

## 2015-08-22 DIAGNOSIS — R000 Tachycardia, unspecified: Secondary | ICD-10-CM | POA: Diagnosis not present

## 2015-08-22 DIAGNOSIS — I10 Essential (primary) hypertension: Secondary | ICD-10-CM | POA: Diagnosis not present

## 2015-08-22 DIAGNOSIS — J441 Chronic obstructive pulmonary disease with (acute) exacerbation: Secondary | ICD-10-CM | POA: Diagnosis not present

## 2015-08-22 LAB — HEPATIC FUNCTION PANEL
ALBUMIN: 3.5 g/dL (ref 3.5–5.0)
ALT: 18 U/L (ref 14–54)
AST: 27 U/L (ref 15–41)
Alkaline Phosphatase: 71 U/L (ref 38–126)
BILIRUBIN TOTAL: 0.8 mg/dL (ref 0.3–1.2)
Bilirubin, Direct: 0.3 mg/dL (ref 0.1–0.5)
Indirect Bilirubin: 0.5 mg/dL (ref 0.3–0.9)
Total Protein: 6.8 g/dL (ref 6.5–8.1)

## 2015-08-22 LAB — BASIC METABOLIC PANEL
ANION GAP: 14 (ref 5–15)
BUN: 14 mg/dL (ref 6–20)
CHLORIDE: 89 mmol/L — AB (ref 101–111)
CO2: 35 mmol/L — AB (ref 22–32)
CREATININE: 0.72 mg/dL (ref 0.44–1.00)
Calcium: 10.1 mg/dL (ref 8.9–10.3)
GFR calc non Af Amer: >60 mL/min (ref >60)
GLUCOSE: 93 mg/dL (ref 65–99)
Potassium: 4.7 mmol/L (ref 3.5–5.1)
Sodium: 138 mmol/L (ref 135–145)

## 2015-08-22 LAB — HIV ANTIBODY (ROUTINE TESTING W REFLEX): HIV Screen 4th Generation wRfx: NONREACTIVE

## 2015-08-22 MED ORDER — PREDNISONE 20 MG PO TABS: 40.0000 mg | ORAL_TABLET | Freq: Every day | ORAL | Status: DC

## 2015-08-22 MED ORDER — UMECLIDINIUM BROMIDE 62.5 MCG/INH IN AEPB: 1.0000 | INHALATION_SPRAY | Freq: Every day | RESPIRATORY_TRACT | Status: DC

## 2015-08-22 MED ORDER — BUSPIRONE HCL 15 MG PO TABS
7.5000 mg | ORAL_TABLET | Freq: Two times a day (BID) | ORAL | Status: DC
  Administered 2015-08-22: 7.5 mg via ORAL
  Filled 2015-08-22: qty 1

## 2015-08-22 MED ORDER — BUTALBITAL-APAP-CAFFEINE 50-325-40 MG PO TABS
1.0000 | ORAL_TABLET | Freq: Once | ORAL | Status: AC
  Administered 2015-08-22: 1 via ORAL
  Filled 2015-08-22: qty 1

## 2015-08-22 MED ORDER — PREDNISONE 20 MG PO TABS: ORAL_TABLET | ORAL | Status: DC

## 2015-08-22 MED ORDER — ALBUTEROL SULFATE HFA 108 (90 BASE) MCG/ACT IN AERS: 1.0000 | INHALATION_SPRAY | RESPIRATORY_TRACT | Status: DC | PRN

## 2015-08-22 MED ORDER — BUTALBITAL-APAP-CAFFEINE 50-325-40 MG PO TABS: 1.0000 | ORAL_TABLET | Freq: Four times a day (QID) | ORAL | Status: DC | PRN

## 2015-08-22 MED ORDER — FLUTICASONE-SALMETEROL 500-50 MCG/DOSE IN AEPB
1.0000 | INHALATION_SPRAY | Freq: Two times a day (BID) | RESPIRATORY_TRACT | Status: DC
Start: 2015-08-22 — End: 2016-04-19

## 2015-08-22 MED ORDER — AZITHROMYCIN 250 MG PO TABS: 250.0000 mg | ORAL_TABLET | Freq: Every day | ORAL | Status: DC

## 2015-08-22 MED ORDER — BUSPIRONE HCL 7.5 MG PO TABS: 7.5000 mg | ORAL_TABLET | Freq: Two times a day (BID) | ORAL | Status: DC

## 2015-08-22 NOTE — Progress Notes (Signed)
Physical Therapy Treatment Patient Details Name: Megan [name] MRN: [id number] DOB: 10/12/1951 Today's Date: 08/22/2015    History of Present Illness 64 year old woman with progressive gold stage 4D COPD comes to the emergency department with several days of increasing dyspnea with exertion. She has had at least 2 other COPD exacerbations in the last year requiring hospitalization.     PT Comments    Good progress today. (see general comments below of SpO2 and HR during therapy session) Focused on energy conservation techniques today which pt tolerated well. Totaling 140 feet of ambulation with one prolonged standing rest break on 2L supplemental O2. Very deconditioned. Adequate for d/c from PT standpoint when medically ready. Needs cardio/pulm rehab when cleared by MD.  Follow Up Recommendations  Supervision - Intermittent (cardiac rehab when appropriate)     Equipment Recommendations  None recommended by PT (now states she has rollator)    Recommendations for Other Services       Precautions / Restrictions Precautions Precautions: Other (comment) (severe SOB) Precaution Comments: on 2LO2 via [place] Restrictions Weight Bearing Restrictions: No    Mobility  Bed Mobility               General bed mobility comments: sitting in chair  Transfers Overall transfer level: Needs assistance Equipment used: None;Rolling walker (2 wheeled) Transfers: Sit to/from Stand Sit to Stand: Supervision         General transfer comment: Supervision for safety with mild/mod sway upon standing from recliner without assistive device. Performed second time with UE support onto RW once standing and much more stable. Cues for hand placement to rise.  Ambulation/Gait Ambulation/Gait assistance: Min guard Ambulation Distance (Feet): 70 Feet (+70 total 140) Assistive device: Rolling walker (2 wheeled) Gait Pattern/deviations: Step-through pattern;Decreased stride length;Trunk flexed Gait  velocity: decreased Gait velocity interpretation: Below normal speed for age/gender General Gait Details: Educated on proper and safe use of DME with a rolling walker. Discussed use of Borg exertion (RPE) scale and to maintain <15 when ambulating. Pt tolerated 70 feet of gait before needing a prolonged standing rest break. Min guard for safety. Reported LE weakness upon second distance but no buckling or loss of balance with RW.   Stairs            Wheelchair Mobility    Modified Rankin (Stroke Patients Only)       Balance Overall balance assessment: Needs assistance Sitting-balance support: No upper extremity supported;Feet supported Sitting balance-Leahy Scale: Normal     Standing balance support: No upper extremity supported Standing balance-Leahy Scale: Fair                      Cognition Arousal/Alertness: Awake/alert Behavior During Therapy: WFL for tasks assessed/performed Overall Cognitive Status: Within Functional Limits for tasks assessed                      Exercises General Exercises - Lower Extremity Ankle Circles/Pumps: AROM;Both;10 reps;Seated Long Arc Quad: Strengthening;Both;10 reps;Seated Hip Flexion/Marching: Strengthening;Both;10 reps;Seated    General Comments General comments (skin integrity, edema, etc.): Resting HR 126 - ambulating HR up to 142, resting HR after sitting 5 min later 130. SpO2 at rest on 2L supplemental O2 92%, ambulating 95%, at rest after ambulation 5 min later 96%. Discussed energy conservation techniques including use of walker, pursed lip breathing, and use of borg scale.      Pertinent Vitals/Pain Pain Assessment: No/denies pain    Home Living  Prior Function            PT Goals (current goals can now be found in the care plan section) Acute Rehab PT Goals Patient Stated Goal: get better ASAP PT Goal Formulation: With patient Time For Goal Achievement:  08/26/15 Potential to Achieve Goals: Good Progress towards PT goals: Progressing toward goals    Frequency  Min 2X/week    PT Plan Current plan remains appropriate    Co-evaluation             End of Session Equipment Utilized During Treatment: Oxygen (2Lo2 via [place]) Activity Tolerance: Patient tolerated treatment well Patient left: with call bell/phone within reach;in chair     Time: 5456-2563 PT Time Calculation (min) (ACUTE ONLY): 17 min  Charges:  $Gait Training: 8-22 mins                    G Codes:      Ellouise Newer 2015/08/26, 1:10 PM Camille Bal [place], [place]

## 2015-08-22 NOTE — Progress Notes (Signed)
OT Eval addendum late entry G codes   September 09, 2015 1054  OT Time Calculation  OT Start Time (ACUTE ONLY) 0912  OT Stop Time (ACUTE ONLY) 0937  OT Time Calculation (min) 25 min  OT G-codes **NOT FOR INPATIENT CLASS**  Functional Limitation Self care  Self Care Current Status (V0131) CI  Self Care Goal Status (Y3888) CI  Self Care Discharge Status (L5797) CI  OT General Charges  $OT Visit 1 Procedure  OT Evaluation  $Initial OT Evaluation Tier I 1 Procedure  OT Treatments  $Therapeutic Activity 8-22 mins

## 2015-08-22 NOTE — Progress Notes (Signed)
August 21, 2015 Pharmacy  Pharmacy Students rounding with [facility], commenting on buspirone's appropriate initiation dose as well as titration details.   For generalized anxiety disorder, buspirone's recommended initiation dose is 7.5 mg BID. This dose can be titrated up by an additional 2.5 mg twice daily (BID) every 2-3 days to a max dose of 30 mg BID. However, most data from clinical trials utilizes 10-15 mg BID.   Aura Fey. March Rummage, PharmD Candidate and Dierdre Harness, PharmD Candidate

## 2015-08-22 NOTE — Progress Notes (Signed)
   08/22/15 1600  Clinical Encounter Type  Visited With Patient  Visit Type Initial;Psychological support;Spiritual support  Referral From Nurse  Consult/Referral To Chaplain  Spiritual Encounters  Spiritual Needs Emotional;Prayer  Stress Factors  Patient Stress Factors Lack of knowledge (Grief of )  [facility] responded to consult; pt grieving son's suicide from several years ago on the anniversary of the event; [place] offered grief and spiritual support; 4:44 PM Gwynn Burly

## 2015-08-22 NOTE — Progress Notes (Signed)
  Echocardiogram 2D Echocardiogram has been performed.  Megan [name] 08/22/2015, 11:28 AM

## 2015-08-22 NOTE — Progress Notes (Signed)
SATURATION QUALIFICATIONS: (This note is used to comply with regulatory documentation for home oxygen)  Patient Saturations on Room Air at Rest = 88%  Patient Saturations on Room Air while Ambulating = 83%  Patient Saturations on 2 Liters of oxygen while Ambulating = 96%  Please briefly explain why patient needs home oxygen:

## 2015-08-22 NOTE — Discharge Instructions (Signed)
1. Take your COPD medications as prescribed.  2. Follow up in Internal Medicine. 3. Follow up with Dr. Vaughan Browner, Pulmonology, for evaluation of lung nodules. 4. Take Azithromycin 250 mg daily 5. Taper Prednisone according to:  10/22-10/25: Take 40 mg daily  10/26: Take 20 mg daily until you follow up

## 2015-08-22 NOTE — Progress Notes (Signed)
Subjective: Overnight, patient experienced shaking, hot flashes, numbness/tingling of her legs, and tachycardia while nursing was trying to replace an IV.  She reports improvement of her symptoms at that time following her PRN Xanax for chronic anxiety.  She had also been experiencing HA, which was relieved with Fioricet.  This morning, she reports feeling tired.  She did not work with PT yesterday because her pulse was elevated after walking to the bathroom.  All ECGs, including during the overnight episode, have demonstrated sinus tachycardia.  Objective: Vital signs in last 24 hours: Filed Vitals:   08/21/15 2019 08/21/15 2034 08/21/15 2111 08/22/15 0429  BP:  156/83 158/91 139/88  Pulse:  163 122 109  Temp:    98.2 F (36.8 C)  TempSrc:    Oral  Resp:    21  Height:      Weight:      SpO2: 95%   98%   Weight change: 0 lb (0 kg)  Intake/Output Summary (Last 24 hours) at 08/22/15 0815 Last data filed at 08/22/15 7209  Gross per 24 hour  Intake    720 ml  Output    150 ml  Net    570 ml   Physical Exam  Constitutional: She is oriented to person, place, and time.  Thin, appears older than stated age.  Sitting in bed, NAD.  HENT:  Head: Normocephalic and atraumatic.  Eyes: EOM are normal.  Cardiovascular: Normal rate, regular rhythm and normal heart sounds.   Pulmonary/Chest: Effort normal.  Decreased breath sounds throughout.  Prolonged expiratory phase.  Minimal wheezes appreciated.  Abdominal: Soft. She exhibits no distension. There is no tenderness. There is no rebound and no guarding.  Musculoskeletal: She exhibits no edema.  No calf tenderness, swelling, or erythema.  Neurological: She is alert and oriented to person, place, and time.  Skin: Skin is warm and dry. No rash noted.    Lab Results: Basic Metabolic Panel:  Recent Labs Lab 08/18/15 2121  NA 136  K 3.8  CL 101  CO2 21*  GLUCOSE 94  BUN 6  CREATININE 0.65  CALCIUM 8.9   Liver Function  Tests:  Recent Labs Lab 08/22/15 0330  AST 27  ALT 18  ALKPHOS 71  BILITOT 0.8  PROT 6.8  ALBUMIN 3.5   No results for input(s): LIPASE, AMYLASE in the last 168 hours. No results for input(s): AMMONIA in the last 168 hours. CBC:  Recent Labs Lab 08/18/15 2121 08/20/15 0411  WBC 9.2 8.1  NEUTROABS 6.2  --   HGB 12.7 12.5  HCT 36.8 37.5  MCV 95.3 98.4  PLT 329 326   Cardiac Enzymes: No results for input(s): CKTOTAL, CKMB, CKMBINDEX, TROPONINI in the last 168 hours. BNP: No results for input(s): PROBNP in the last 168 hours. D-Dimer: No results for input(s): DDIMER in the last 168 hours. CBG: No results for input(s): GLUCAP in the last 168 hours. Hemoglobin A1C: No results for input(s): HGBA1C in the last 168 hours. Fasting Lipid Panel: No results for input(s): CHOL, HDL, LDLCALC, TRIG, CHOLHDL, LDLDIRECT in the last 168 hours. Thyroid Function Tests: No results for input(s): TSH, T4TOTAL, FREET4, T3FREE, THYROIDAB in the last 168 hours. Coagulation: No results for input(s): LABPROT, INR in the last 168 hours. Anemia Panel: No results for input(s): VITAMINB12, FOLATE, FERRITIN, TIBC, IRON, RETICCTPCT in the last 168 hours. Urine Drug Screen: Drugs of Abuse  No results found for: LABOPIA, COCAINSCRNUR, LABBENZ, AMPHETMU, THCU, LABBARB  Alcohol Level: No  results for input(s): ETH in the last 168 hours. Urinalysis: No results for input(s): COLORURINE, LABSPEC, PHURINE, GLUCOSEU, HGBUR, BILIRUBINUR, KETONESUR, PROTEINUR, UROBILINOGEN, NITRITE, LEUKOCYTESUR in the last 168 hours.  Invalid input(s): APPERANCEUR Misc. Labs:   Micro Results: No results found for this or any previous visit (from the past 240 hour(s)). Studies/Results: No results found. Medications: I have reviewed the patient's current medications. Scheduled Meds: . aspirin EC  81 mg Oral Daily  . azithromycin  250 mg Oral Daily  . calcium-vitamin D  1 tablet Oral BID  . escitalopram  20 mg Oral  Daily  . feeding supplement (ENSURE ENLIVE)  237 mL Oral BID BM  . ipratropium-albuterol  3 mL Nebulization Q4H  . lisinopril  2.5 mg Oral Daily  . mometasone-formoterol  2 puff Inhalation BID  . multivitamin with minerals  1 tablet Oral Daily  . polyethylene glycol  17 g Oral Daily  . predniSONE  60 mg Oral Q breakfast   Continuous Infusions:  PRN Meds:.acetaminophen, albuterol, ALPRAZolam, promethazine, ramelteon   Assessment/Plan: Principal Problem:   COPD with acute exacerbation ([place]) Active Problems:   Multiple pulmonary nodules determined by computed tomography of lung   Essential hypertension   COPD exacerbation ([facility])   Protein-calorie malnutrition, severe   Anxiety state   Chronic post-traumatic stress disorder (PTSD)  Megan [name] is a very pleasant 64 year old woman with a past medical history of COPD, HTN, PTSD, and anxiety who presents with worsening shortness of breath.   COPD Exacerbation: Likely brought on by URI due to recent sick contacts. PFTs from [facility] in March 2016 demonstrate FEV1/FVC ~40% whyperith FEV1 21% predicted. 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 No evidence of pneumonia on chest X-ray and no suspicion for PE at this time. Telemetry shows intermittent sinus tachycardia.  Patient's tachycardia on exertion is sinus tach, confirmed on 12 lead ECG.  Tachycardia likely 2/2 deconditioning and patient will need pulmonary and cardiac rehab on discharge. - Duoneb q4h - Dulera 2 puffs BID - Azithromycin '500mg'$  once and 250 mg daily on discharge indefinitely - Mucinex 600 mg BID - Prednisone 60 mg x4 days, 40 mg x4 days, and 20 mg daily on discharge indefinitely - Calcium and Vit D supplementation  Sinus Tachycardia: Likely 2/2 deconditioning.  However, given longstanding, end stage COPD, will investigate TTE for evaluation of possible cardiac pathology (pulmonary hypertension vs wall motion abnormality vs right heart  strain). - TTE  PTSD/Anxiety: chronic.  Last evenings episode seems most consistent with anxiety attack during manipulation of IV.  Attack alleviated with Xanax and Fioricet, both of which are GABAergic.  Patient is on max dosing of Lexapro.  Will add Buspar for added longer term benefit. - Escitalopram 20 mg daily - Xanax 0.25 mg BID PRN - START Buspar 7.5 mg BID for added anxiety benefit - Chaplain consult - Social work consult for bereavement counseling.  Pulmonary Nodules on CT: Previously seen in [place] and seen again by Pulmonologists at [facility], with concern for NSCLC.  CT guided biopsy and PET scan was recommended, but have not been performed.  Patient desires to transition care to [facility] and we will refer her for follow up. - Pulmonary/Oncology multidisciplinary clinic referral.  HTN: Controlled.  - Continue home ASA and lisinopril 2.5 mg   Insomnia: Reports taking unknown dose of melatonin at home. However, she reports that insomnia is a persistent problem for her - Ramelteon - Consider  mirtazapine as this can stimulate appetite and treat insomnia  Weight Loss: Likely related to emphysema and chronically increased respiratory effort. Treated with cyproheptadine as an outpatient.  Concern for underlying malignancy given pulmonary nodules c/f NSCLC. Will refer for pulmonary evaluation. - Ensure - Buspar  DVT Prophylaxis: Lovenox  Code Status: Full  Dispo: Disposition is deferred at this time, awaiting improvement of current medical problems.  Anticipated discharge in approximately 1-2 day(s).   The patient does not have a current PCP (Pcp Not In System) and does need an [facility] hospital follow-up appointment after discharge.  The patient does not have transportation limitations that hinder transportation to clinic appointments.  .Services Needed at time of discharge: Y = Yes, Blank = No PT:    OT:   RN:   Equipment:   Other:  cardiac and pulmonary rehab    LOS: 3 days    Iline Oven, MD 08/22/2015, 8:15 AM

## 2015-08-23 ENCOUNTER — Other Ambulatory Visit: Payer: Self-pay | Admitting: Internal Medicine

## 2015-08-23 LAB — HEPATITIS C ANTIBODY (REFLEX): HCV Ab: <0.1 s/co ratio (ref 0.0–0.9)

## 2015-08-23 LAB — HCV COMMENT:

## 2015-08-23 NOTE — Progress Notes (Signed)
Pt discharged to home with daughter. Discharge instructions and education reviewed with patient and patient verbalized understanding of them. Pt belongings and prescriptions in pt possession. Brought down in wheelchair by NT to pt's daughter. Oren Beckmann, RN

## 2015-08-25 ENCOUNTER — Encounter (HOSPITAL_COMMUNITY): Payer: Self-pay | Admitting: Emergency Medicine

## 2015-08-25 ENCOUNTER — Emergency Department (HOSPITAL_COMMUNITY): Payer: Medicare Other

## 2015-08-25 ENCOUNTER — Inpatient Hospital Stay (HOSPITAL_COMMUNITY)
Admission: EM | Admit: 2015-08-25 | Discharge: 2015-08-29 | DRG: 189 | Disposition: A | Discharge status: Skilled Nursing Facility | Payer: Medicare Other | Attending: Student in an Organized Health Care Education/Training Program | Admitting: Student in an Organized Health Care Education/Training Program

## 2015-08-25 DIAGNOSIS — R200 Anesthesia of skin: Secondary | ICD-10-CM | POA: Diagnosis present

## 2015-08-25 DIAGNOSIS — E872 Acidosis: Secondary | ICD-10-CM | POA: Diagnosis present

## 2015-08-25 DIAGNOSIS — R000 Tachycardia, unspecified: Secondary | ICD-10-CM | POA: Diagnosis not present

## 2015-08-25 DIAGNOSIS — J9622 Acute and chronic respiratory failure with hypercapnia: Secondary | ICD-10-CM | POA: Diagnosis not present

## 2015-08-25 DIAGNOSIS — I160 Hypertensive urgency: Secondary | ICD-10-CM | POA: Diagnosis not present

## 2015-08-25 DIAGNOSIS — I959 Hypotension, unspecified: Secondary | ICD-10-CM | POA: Diagnosis not present

## 2015-08-25 DIAGNOSIS — Z87891 Personal history of nicotine dependence: Secondary | ICD-10-CM

## 2015-08-25 DIAGNOSIS — R918 Other nonspecific abnormal finding of lung field: Secondary | ICD-10-CM | POA: Diagnosis not present

## 2015-08-25 DIAGNOSIS — R0602 Shortness of breath: Secondary | ICD-10-CM | POA: Diagnosis not present

## 2015-08-25 DIAGNOSIS — Z7982 Long term (current) use of aspirin: Secondary | ICD-10-CM

## 2015-08-25 DIAGNOSIS — F411 Generalized anxiety disorder: Secondary | ICD-10-CM | POA: Diagnosis present

## 2015-08-25 DIAGNOSIS — Z681 Body mass index (BMI) 19 or less, adult: Secondary | ICD-10-CM | POA: Diagnosis not present

## 2015-08-25 DIAGNOSIS — Z7189 Other specified counseling: Secondary | ICD-10-CM | POA: Diagnosis not present

## 2015-08-25 DIAGNOSIS — E43 Unspecified severe protein-calorie malnutrition: Secondary | ICD-10-CM | POA: Diagnosis present

## 2015-08-25 DIAGNOSIS — R069 Unspecified abnormalities of breathing: Secondary | ICD-10-CM | POA: Diagnosis not present

## 2015-08-25 DIAGNOSIS — Z7952 Long term (current) use of systemic steroids: Secondary | ICD-10-CM | POA: Diagnosis not present

## 2015-08-25 DIAGNOSIS — I10 Essential (primary) hypertension: Secondary | ICD-10-CM | POA: Diagnosis not present

## 2015-08-25 DIAGNOSIS — J9621 Acute and chronic respiratory failure with hypoxia: Principal | ICD-10-CM | POA: Diagnosis present

## 2015-08-25 DIAGNOSIS — R778 Other specified abnormalities of plasma proteins: Secondary | ICD-10-CM | POA: Diagnosis present

## 2015-08-25 DIAGNOSIS — R748 Abnormal levels of other serum enzymes: Secondary | ICD-10-CM | POA: Diagnosis present

## 2015-08-25 DIAGNOSIS — K219 Gastro-esophageal reflux disease without esophagitis: Secondary | ICD-10-CM | POA: Diagnosis present

## 2015-08-25 DIAGNOSIS — J441 Chronic obstructive pulmonary disease with (acute) exacerbation: Secondary | ICD-10-CM | POA: Diagnosis present

## 2015-08-25 DIAGNOSIS — R911 Solitary pulmonary nodule: Secondary | ICD-10-CM | POA: Diagnosis present

## 2015-08-25 DIAGNOSIS — I252 Old myocardial infarction: Secondary | ICD-10-CM

## 2015-08-25 DIAGNOSIS — G4700 Insomnia, unspecified: Secondary | ICD-10-CM | POA: Diagnosis present

## 2015-08-25 DIAGNOSIS — R7989 Other specified abnormal findings of blood chemistry: Secondary | ICD-10-CM

## 2015-08-25 DIAGNOSIS — I249 Acute ischemic heart disease, unspecified: Secondary | ICD-10-CM | POA: Diagnosis not present

## 2015-08-25 DIAGNOSIS — E861 Hypovolemia: Secondary | ICD-10-CM | POA: Diagnosis not present

## 2015-08-25 DIAGNOSIS — F419 Anxiety disorder, unspecified: Secondary | ICD-10-CM | POA: Diagnosis present

## 2015-08-25 DIAGNOSIS — I272 Other secondary pulmonary hypertension: Secondary | ICD-10-CM | POA: Diagnosis present

## 2015-08-25 DIAGNOSIS — F4312 Post-traumatic stress disorder, chronic: Secondary | ICD-10-CM | POA: Diagnosis not present

## 2015-08-25 DIAGNOSIS — M6281 Muscle weakness (generalized): Secondary | ICD-10-CM | POA: Diagnosis not present

## 2015-08-25 DIAGNOSIS — Z9981 Dependence on supplemental oxygen: Secondary | ICD-10-CM | POA: Diagnosis not present

## 2015-08-25 DIAGNOSIS — J80 Acute respiratory distress syndrome: Secondary | ICD-10-CM | POA: Diagnosis not present

## 2015-08-25 DIAGNOSIS — R64 Cachexia: Secondary | ICD-10-CM | POA: Diagnosis present

## 2015-08-25 DIAGNOSIS — R0902 Hypoxemia: Secondary | ICD-10-CM

## 2015-08-25 DIAGNOSIS — J9602 Acute respiratory failure with hypercapnia: Secondary | ICD-10-CM | POA: Diagnosis not present

## 2015-08-25 DIAGNOSIS — I161 Hypertensive emergency: Secondary | ICD-10-CM | POA: Diagnosis present

## 2015-08-25 DIAGNOSIS — Z515 Encounter for palliative care: Secondary | ICD-10-CM | POA: Diagnosis not present

## 2015-08-25 DIAGNOSIS — J439 Emphysema, unspecified: Secondary | ICD-10-CM | POA: Diagnosis not present

## 2015-08-25 LAB — I-STAT CHEM 8, ED
BUN: 28 mg/dL — ABNORMAL HIGH (ref 6–20)
CALCIUM ION: 1.05 mmol/L — AB (ref 1.13–1.30)
Chloride: 89 mmol/L — ABNORMAL LOW (ref 101–111)
Creatinine, Ser: 0.9 mg/dL (ref 0.44–1.00)
Glucose, Bld: 112 mg/dL — ABNORMAL HIGH (ref 65–99)
HCT: 49 % — ABNORMAL HIGH (ref 36.0–46.0)
HEMOGLOBIN: 16.7 g/dL — AB (ref 12.0–15.0)
Potassium: 4.7 mmol/L (ref 3.5–5.1)
SODIUM: 129 mmol/L — AB (ref 135–145)
TCO2: 30 mmol/L (ref 0–100)

## 2015-08-25 LAB — URINALYSIS, ROUTINE W REFLEX MICROSCOPIC
BILIRUBIN URINE: NEGATIVE
Glucose, UA: NEGATIVE mg/dL
Ketones, ur: 40 mg/dL — AB
Leukocytes, UA: NEGATIVE
NITRITE: NEGATIVE
PROTEIN: 30 mg/dL — AB
Specific Gravity, Urine: 1.019 (ref 1.005–1.030)
UROBILINOGEN UA: 0.2 mg/dL (ref 0.0–1.0)
pH: 6 (ref 5.0–8.0)

## 2015-08-25 LAB — CBC WITH DIFFERENTIAL/PLATELET
BASOS PCT: 0 %
Basophils Absolute: 0 10*3/uL (ref 0.0–0.1)
EOS ABS: 0 10*3/uL (ref 0.0–0.7)
Eosinophils Relative: 0 %
HCT: 43.1 % (ref 36.0–46.0)
HEMOGLOBIN: 15.2 g/dL — AB (ref 12.0–15.0)
Lymphocytes Relative: 29 %
Lymphs Abs: 2.9 10*3/uL (ref 0.7–4.0)
MCH: 32.8 pg (ref 26.0–34.0)
MCHC: 35.3 g/dL (ref 30.0–36.0)
MCV: 93.1 fL (ref 78.0–100.0)
MONO ABS: 0.5 10*3/uL (ref 0.1–1.0)
Monocytes Relative: 5 %
NEUTROS ABS: 6.5 10*3/uL (ref 1.7–7.7)
Neutrophils Relative %: 66 %
PLATELETS: ADEQUATE 10*3/uL (ref 150–400)
RBC: 4.63 MIL/uL (ref 3.87–5.11)
RDW: 11.3 % — ABNORMAL LOW (ref 11.5–15.5)
WBC: 9.9 10*3/uL (ref 4.0–10.5)

## 2015-08-25 LAB — COMPREHENSIVE METABOLIC PANEL
ALK PHOS: 67 U/L (ref 38–126)
ALT: 21 U/L (ref 14–54)
AST: 31 U/L (ref 15–41)
Albumin: 4.1 g/dL (ref 3.5–5.0)
Anion gap: 17 — ABNORMAL HIGH (ref 5–15)
BILIRUBIN TOTAL: 0.9 mg/dL (ref 0.3–1.2)
BUN: 20 mg/dL (ref 6–20)
CALCIUM: 9.1 mg/dL (ref 8.9–10.3)
CO2: 26 mmol/L (ref 22–32)
CREATININE: 0.87 mg/dL (ref 0.44–1.00)
Chloride: 86 mmol/L — ABNORMAL LOW (ref 101–111)
Glucose, Bld: 110 mg/dL — ABNORMAL HIGH (ref 65–99)
Potassium: 4.9 mmol/L (ref 3.5–5.1)
Sodium: 129 mmol/L — ABNORMAL LOW (ref 135–145)
Total Protein: 7.1 g/dL (ref 6.5–8.1)

## 2015-08-25 LAB — I-STAT ARTERIAL BLOOD GAS, ED
ACID-BASE EXCESS: 3 mmol/L — AB (ref 0.0–2.0)
ACID-BASE EXCESS: 4 mmol/L — AB (ref 0.0–2.0)
BICARBONATE: 35.1 meq/L — AB (ref 20.0–24.0)
Bicarbonate: 33 mEq/L — ABNORMAL HIGH (ref 20.0–24.0)
O2 SAT: 93 %
O2 Saturation: 96 %
PCO2 ART: 86.2 mmHg — AB (ref 35.0–45.0)
PH ART: 7.218 — AB (ref 7.350–7.450)
PH ART: 7.315 — AB (ref 7.350–7.450)
PO2 ART: 74 mmHg — AB (ref 80.0–100.0)
TCO2: 38 mmol/L (ref 0–100)
TCO2: 35 mmol/L (ref 0–100)
pCO2 arterial: 64.7 mmHg (ref 35.0–45.0)
pO2, Arterial: 100 mmHg (ref 80.0–100.0)

## 2015-08-25 LAB — I-STAT TROPONIN, ED: TROPONIN I, POC: 0.19 ng/mL — AB (ref 0.00–0.08)

## 2015-08-25 LAB — URINE MICROSCOPIC-ADD ON

## 2015-08-25 MED ORDER — BUDESONIDE 0.25 MG/2ML IN SUSP
0.5000 mg | Freq: Two times a day (BID) | RESPIRATORY_TRACT | Status: DC
  Administered 2015-08-26: 0.5 mg via RESPIRATORY_TRACT
  Filled 2015-08-25 (×3): qty 4

## 2015-08-25 MED ORDER — NITROGLYCERIN IN D5W 200-5 MCG/ML-% IV SOLN
0.0000 ug/min | Freq: Once | INTRAVENOUS | Status: AC
  Administered 2015-08-25: 15 ug/min via INTRAVENOUS
  Filled 2015-08-25: qty 250

## 2015-08-25 MED ORDER — METHYLPREDNISOLONE SODIUM SUCC 125 MG IJ SOLR
60.0000 mg | Freq: Three times a day (TID) | INTRAMUSCULAR | Status: AC
  Administered 2015-08-26 (×4): 60 mg via INTRAVENOUS
  Filled 2015-08-25 (×4): qty 2

## 2015-08-25 MED ORDER — PHENYLEPHRINE 40 MCG/ML (10ML) SYRINGE FOR IV PUSH (FOR BLOOD PRESSURE SUPPORT): 120.0000 ug | PREFILLED_SYRINGE | Freq: Once | INTRAVENOUS | Status: DC | PRN

## 2015-08-25 MED ORDER — DOXYCYCLINE HYCLATE 100 MG IV SOLR
100.0000 mg | Freq: Once | INTRAVENOUS | Status: AC
  Administered 2015-08-25: 100 mg via INTRAVENOUS
  Filled 2015-08-25: qty 100

## 2015-08-25 MED ORDER — LORAZEPAM 0.5 MG PO TABS: 0.5000 mg | ORAL_TABLET | Freq: Once | ORAL | Status: DC

## 2015-08-25 MED ORDER — PHENYLEPHRINE 40 MCG/ML (10ML) SYRINGE FOR IV PUSH (FOR BLOOD PRESSURE SUPPORT)
80.0000 ug | PREFILLED_SYRINGE | Freq: Once | INTRAVENOUS | Status: DC | PRN
  Administered 2015-08-25: 80 ug via INTRAVENOUS
  Filled 2015-08-25: qty 20

## 2015-08-25 MED ORDER — ARFORMOTEROL TARTRATE 15 MCG/2ML IN NEBU: 15.0000 ug | INHALATION_SOLUTION | Freq: Two times a day (BID) | RESPIRATORY_TRACT | Status: DC

## 2015-08-25 MED ORDER — DOXYCYCLINE HYCLATE 100 MG PO TABS
100.0000 mg | ORAL_TABLET | Freq: Two times a day (BID) | ORAL | Status: DC
  Administered 2015-08-26: 100 mg via ORAL
  Filled 2015-08-25: qty 1

## 2015-08-25 NOTE — ED Notes (Signed)
Patient recent discharged from hospital last week.  Patient with increased shortness of breath.  Patient has history of Lung CA and COPD.  Patient on neb treatment on arrival to department.  Patient is diminished in bilat lungs.  Patient was found in tripod position, audible wheezing.

## 2015-08-25 NOTE — ED Notes (Signed)
2nd IV attempt x 2, unsuccessful.

## 2015-08-25 NOTE — Consult Note (Signed)
PULMONARY / CRITICAL CARE MEDICINE   Name: Megan [name] MRN: [id number] DOB: 1950-11-30    ADMISSION DATE:  08/25/2015 CONSULTATION DATE:  08/25/2015  REFERRING MD :  EDP  CHIEF COMPLAINT:  SOB  INITIAL PRESENTATION: 64 year old female with known history and recent admission for COPD exacerbation.is discharged 3 days ago she was discharged 3 days ago on prednisone. Has been short of breath since that time and presented again on 10/24 complaining of shortness of breath. Blood gas in ED showed profound respiratory acidosis requiring BiPAP. P CCM asked to see.  STUDIES:  CXR admission > hyperinflation, flattened diaphragm. 6/20 CT chest [facility])  > There are scattered bilateral irregular nodular opacities which are unchanged in size compared to prior examination. For example a partially solid and partially groundglass nodule in the left lower lobe measures up to 1.7 cm, previously 1.7 cm (series 2, image 49). A nodule in the right lower lobe (series 2, image 32) measures 6 mm, previous is 6 mm. Centrilobular emphysema. F/u CT recommended in 12 months.  3/2 PFT([facility]) > FEV1/FCV pre 33.6, post 34.48%. DLCO 56.5%   SIGNIFICANT EVENTS: 10/17 - 10/21 > admit for COPD exacerbation.    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 She also has a history of anxiety and PTSD. She has recently been in the process of moving from [place] to [place] and has not had very consistent medical care during that transition. She reports 4 hospitalizations in the past year at [facility], [place], and most recently a [facility] earlier this month. During her admission at [facility] she was noted to have multiple pulmonary nodules, which were suspected to be lung cancer but no tissue diagnosis has been made as of yet. She is admitted 10/17 for COPD exacerbation after presenting with shortness of breath 3 days and yellow productive  cough with wheezing also complained of subjective fevers. She was treated with nebulized bronchodilators, oral steroids, and azithromycin. She had daily improvements and was discharged 10/21 on 20 mg of prednisone. Pulmonary follow-up was scheduled with Dr. Vaughan Browner and with follow-up to pulmonary rehabilitation.  She reports that she did not feel back to baseline at the time of discharge, and had continued shortness of breath, productive cough since her arrival back home. She continued taking prednisone 20 mg and bronchodilators but shortness of breath progressed to the point that she presented to the emergency department 10/24. Upon presentation to the emergency department she was in the tripod position with audible wheezing. She was started on nebulized bronchodilators and placed on BiPAP. Initial ABG after starting BiPAP showed acute on chronic hypercarbic respiratory failure. P CCM was asked for further evaluation.  PAST MEDICAL HISTORY :   has a past medical history of Myocardial infarction [facility]); CHF (congestive heart failure) ([place]); COPD (chronic obstructive pulmonary disease) ([place]); Shortness of breath dyspnea; Depression; GERD (gastroesophageal reflux disease); History of hiatal hernia; Headache; Anemia; Essential hypertension (08/19/2015); and Anxiety state (08/20/2015).  has past surgical history that includes Cardiac catheterization. Prior to Admission medications   Medication Sig Start Date End Date Taking? Authorizing Provider  acetaminophen (TYLENOL) 500 MG tablet Take 500 mg by mouth every 6 (six) hours as needed for mild pain.    Historical Provider, MD  albuterol (PROVENTIL HFA;VENTOLIN HFA) 108 (90 BASE) MCG/ACT inhaler Inhale 1 puff into the lungs every 4 (four) hours as needed for wheezing or shortness of  breath. 08/22/15   Iline Oven, MD  azithromycin (ZITHROMAX) 250 MG tablet Take 1 tablet (250 mg total) by mouth daily. 08/22/15   Iline Oven, MD  busPIRone (BUSPAR) 7.5  MG tablet Take 1 tablet (7.5 mg total) by mouth 2 (two) times daily. 08/22/15   Iline Oven, MD  butalbital-acetaminophen-caffeine (FIORICET) [phone] MG tablet Take 1-2 tablets by mouth every 6 (six) hours as needed for headache. 08/22/15 08/21/16  Iline Oven, MD  Fluticasone-Salmeterol (ADVAIR) 500-50 MCG/DOSE AEPB Inhale 1 puff into the lungs 2 (two) times daily. 08/22/15   Iline Oven, MD  lisinopril (PRINIVIL,ZESTRIL) 2.5 MG tablet Take 2.5 mg by mouth daily.    Historical Provider, MD  predniSONE (DELTASONE) 20 MG tablet 10/22-10/25: Take (40 mg) 2 tablets daily; 10/26: take (20 mg) 1 tablet daily until followup 08/22/15   Iline Oven, MD  sertraline (ZOLOFT) 50 MG tablet Take 50 mg by mouth 2 (two) times daily.    Historical Provider, MD  Umeclidinium Bromide (INCRUSE ELLIPTA) 62.5 MCG/INH AEPB Inhale 1 puff into the lungs daily. 08/22/15   Iline Oven, MD   Allergies  Allergen Reactions  . Penicillins Rash    Has patient had a PCN reaction causing immediate rash, facial/tongue/throat swelling, SOB or lightheadedness with hypotension: {Yes Has patient had a PCN reaction causing severe rash involving mucus membranes or skin necrosis:NO Has patient had a PCN reaction that required hospitalization {Yes Has patient had a PCN reaction occurring within the last 10 years:NO If all of the above answers are "NO", then may proceed with Cephalosporin use.    FAMILY HISTORY:  has no family status information on file.  SOCIAL HISTORY:  reports that she quit smoking about 15 years ago. She has never used smokeless tobacco. She reports that she does not drink alcohol.  REVIEW OF SYSTEMS:   Bolds are positive  Constitutional: weight loss, gain, night sweats, Fevers, chills, fatigue .  HEENT: headaches, Sore throat, sneezing, nasal congestion, post nasal drip, Difficulty swallowing, Tooth/dental problems, visual complaints visual changes, ear ache CV:  chest pain,  radiates: ,Orthopnea, PND, swelling in lower extremities, dizziness, palpitations, syncope.  GI  heartburn, indigestion, abdominal pain, nausea, vomiting, diarrhea, change in bowel habits, loss of appetite, bloody stools.  Resp: cough, productive:green sputum , hemoptysis, dyspnea, chest pain, pleuritic.  Skin: rash or itching or icterus GU: dysuria, change in color of urine, urgency or frequency. flank pain, hematuria  MS: joint pain or swelling. decreased range of motion  Psych: change in mood or affect. depression or anxiety.  Neuro: difficulty with speech, weakness, numbness, ataxia    SUBJECTIVE:   VITAL SIGNS: Temp:  [98.1 F (36.7 C)] 98.1 F (36.7 C) (10/24 2105) Pulse Rate:  [100-142] 100 (10/24 2215) Resp:  [19-30] 19 (10/24 2215) BP: (98-245)/(58-196) 105/80 mmHg (10/24 2200) SpO2:  [94 %-100 %] 95 % (10/24 2215) FiO2 (%):  [40 %] 40 % (10/24 2049) HEMODYNAMICS:   VENTILATOR SETTINGS: Vent Mode:  [-] PCV FiO2 (%):  [40 %] 40 % Set Rate:  [15 bmp] 15 bmp PEEP:  [5 cmH20-7 cmH20] 7 cmH20 INTAKE / OUTPUT: No intake or output data in the 24 hours ending 08/25/15 2303  PHYSICAL EXAMINATION: General:  Female appears older than stated age, cachectic Neuro: Alert, oriented, non-focal HEENT:   [place]/AT, no JVD, PERRL Cardiovascular:  Tachy, regular, no MRG. No edema Lungs:  Bilateral wheeze, unlabored on BiPAP Abdomen:  Soft, non-tender, non-distended Musculoskeletal:  No acute  deformity or ROM limitation Skin:  Grossly intact  LABS:  CBC  Recent Labs Lab 08/20/15 0411 08/25/15 2116 08/25/15 2124  WBC 8.1 9.9  --   HGB 12.5 15.2* 16.7*  HCT 37.5 43.1 49.0*  PLT 326 PLATELET CLUMPS NOTED ON SMEAR, COUNT APPEARS ADEQUATE  --    Coag's No results for input(s): APTT, INR in the last 168 hours. BMET  Recent Labs Lab 08/22/15 1010 08/25/15 2116 08/25/15 2124  NA 138 129* 129*  K 4.7 4.9 4.7  CL 89* 86* 89*  CO2 35* 26  --   BUN 14 20 28*  CREATININE 0.72  0.87 0.90  GLUCOSE 93 110* 112*   Electrolytes  Recent Labs Lab 08/22/15 1010 08/25/15 2116  CALCIUM 10.1 9.1   Sepsis Markers No results for input(s): LATICACIDVEN, PROCALCITON, O2SATVEN in the last 168 hours. ABG  Recent Labs Lab 08/25/15 2128  PHART 7.218*  PCO2ART 86.2*  PO2ART 100.0   Liver Enzymes  Recent Labs Lab 08/22/15 0330 08/25/15 2116  AST 27 31  ALT 18 21  ALKPHOS 71 67  BILITOT 0.8 0.9  ALBUMIN 3.5 4.1   Cardiac Enzymes No results for input(s): TROPONINI, PROBNP in the last 168 hours. Glucose No results for input(s): GLUCAP in the last 168 hours.  Imaging Dg Chest Portable 1 View  08/25/2015  CLINICAL DATA:  Acute onset of shortness of breath. Initial encounter. EXAM: PORTABLE CHEST 1 VIEW COMPARISON:  Chest radiograph performed 08/18/2015 FINDINGS: The lungs are well-aerated and clear. There is no evidence of focal opacification, pleural effusion or pneumothorax. Bilateral nipple shadows are seen. The cardiomediastinal silhouette is within normal limits. No acute osseous abnormalities are seen. IMPRESSION: No acute cardiopulmonary process seen. Electronically Signed   By: Garald Balding M.D.   On: 08/25/2015 21:16     ASSESSMENT / PLAN:  Acute on chronic hypercarbic respiratory failure COPD with acute exacerbation (on home O2) Multiple lung nodules described on recent admission -Continue BiPAP tonight, goal 4 hours on 1 hour off.  -Titrate FiO2 to keep SpO2 90-95% -Nebulized budesonide, [place] -Solumedrol -PO doxycycline for 5 days -NPO for tonight -Supposedly has pulmonary follow-up scheduled with Dr. Vaughan Browner , and referral for pulmonary rehab  Hypotension > suspect hypovolemia -Telemetry monitoring -IV hydration    Georgann Housekeeper, AGACNP-BC [facility] Pulmonology/Critical Care Pager [phone] or [phone]  08/25/2015 11:29 PM

## 2015-08-25 NOTE — ED Notes (Signed)
Pt c/o mid cp that is dull now.

## 2015-08-25 NOTE — Progress Notes (Signed)
Per nanivati

## 2015-08-25 NOTE — ED Provider Notes (Signed)
CSN: [id number]     Arrival date & time 08/25/15  2047 History   First MD Initiated Contact with Patient 08/25/15 2056     Chief Complaint  Patient presents with  . Respiratory Distress     (Consider location/radiation/quality/duration/timing/severity/associated sxs/prior Treatment) Patient is a 64 y.o. female presenting with shortness of breath. The history is provided by the patient, the EMS personnel and medical records. The history is limited by the condition of the patient.  Shortness of Breath Severity:  Severe Onset quality:  Gradual Duration:  1 day Timing:  Constant Progression:  Worsening Chronicity:  Recurrent Relieved by:  None tried Worsened by:  Stress Ineffective treatments:  None tried Associated symptoms: wheezing   Associated symptoms: no abdominal pain, no chest pain, no fever, no headaches, no rash, no sputum production and no vomiting   Risk factors: tobacco use     Past Medical History  Diagnosis Date  . Myocardial infarction ([place])   . CHF (congestive heart failure) ([place])   . COPD (chronic obstructive pulmonary disease) ([place])   . Shortness of breath dyspnea   . Depression   . GERD (gastroesophageal reflux disease)   . History of hiatal hernia   . Headache   . Anemia   . Essential hypertension 08/19/2015  . Anxiety state 08/20/2015   Past Surgical History  Procedure Laterality Date  . Cardiac catheterization     History reviewed. No pertinent family history. Social History  Substance Use Topics  . Smoking status: Former Smoker    Quit date: 08/18/2000  . Smokeless tobacco: Never Used  . Alcohol Use: No   OB History    No data available     Review of Systems  Constitutional: Negative for fever.  HENT: Negative for facial swelling.   Respiratory: Positive for shortness of breath and wheezing. Negative for sputum production.   Cardiovascular: Negative for chest pain.  Gastrointestinal: Negative for vomiting and abdominal pain.   Genitourinary: Negative for dysuria.  Musculoskeletal: Negative for back pain.  Skin: Negative for rash.  Neurological: Negative for headaches.  Psychiatric/Behavioral: Negative for confusion.      Allergies  Penicillins  Home Medications   Prior to Admission medications   Medication Sig Start Date End Date Taking? Authorizing Provider  acetaminophen (TYLENOL) 500 MG tablet Take 500 mg by mouth every 6 (six) hours as needed for mild pain.    Historical Provider, MD  albuterol (PROVENTIL HFA;VENTOLIN HFA) 108 (90 BASE) MCG/ACT inhaler Inhale 1 puff into the lungs every 4 (four) hours as needed for wheezing or shortness of breath. 08/22/15   Iline Oven, MD  azithromycin (ZITHROMAX) 250 MG tablet Take 1 tablet (250 mg total) by mouth daily. 08/22/15   Iline Oven, MD  busPIRone (BUSPAR) 7.5 MG tablet Take 1 tablet (7.5 mg total) by mouth 2 (two) times daily. 08/22/15   Iline Oven, MD  butalbital-acetaminophen-caffeine (FIORICET) [phone] MG tablet Take 1-2 tablets by mouth every 6 (six) hours as needed for headache. 08/22/15 08/21/16  Iline Oven, MD  Fluticasone-Salmeterol (ADVAIR) 500-50 MCG/DOSE AEPB Inhale 1 puff into the lungs 2 (two) times daily. 08/22/15   Iline Oven, MD  lisinopril (PRINIVIL,ZESTRIL) 2.5 MG tablet Take 2.5 mg by mouth daily.    Historical Provider, MD  predniSONE (DELTASONE) 20 MG tablet 10/22-10/25: Take (40 mg) 2 tablets daily; 10/26: take (20 mg) 1 tablet daily until followup 08/22/15   Iline Oven, MD  sertraline (ZOLOFT) 50 MG tablet Take  50 mg by mouth 2 (two) times daily.    Historical Provider, MD  Umeclidinium Bromide (INCRUSE ELLIPTA) 62.5 MCG/INH AEPB Inhale 1 puff into the lungs daily. 08/22/15   Iline Oven, MD   BP 245/196 mmHg  Pulse 142  Resp 30  SpO2 100% Physical Exam  Constitutional: She is oriented to person, place, and time. She appears well-developed and well-nourished. No distress.  HENT:   Head: Normocephalic and atraumatic.  Right Ear: External ear normal.  Left Ear: External ear normal.  Nose: Nose normal.  Mouth/Throat: Oropharynx is clear and moist. No oropharyngeal exudate.  Eyes: Conjunctivae and EOM are normal. Pupils are equal, round, and reactive to light. Right eye exhibits no discharge. Left eye exhibits no discharge. No scleral icterus.  Neck: Normal range of motion. Neck supple. No JVD present. No tracheal deviation present. No thyromegaly present.  Cardiovascular: Regular rhythm and intact distal pulses.  Tachycardia present.   Pulmonary/Chest: No stridor. She is in respiratory distress. She has wheezes. She has no rales. She exhibits no tenderness.  Abdominal: Soft. She exhibits no distension.  Musculoskeletal: Normal range of motion. She exhibits no edema or tenderness.  Lymphadenopathy:    She has no cervical adenopathy.  Neurological: She is alert and oriented to person, place, and time.  Skin: Skin is warm and dry. No rash noted. She is not diaphoretic. No erythema. No pallor.  Psychiatric: Cognition and memory are normal.  Nursing note and vitals reviewed.   ED Course  Procedures (including critical care time) Labs Review Labs Reviewed  MRSA PCR SCREENING - Abnormal; Notable for the following:    MRSA by PCR POSITIVE (*)    All other components within normal limits  COMPREHENSIVE METABOLIC PANEL - Abnormal; Notable for the following:    Sodium 129 (*)    Chloride 86 (*)    Glucose, Bld 110 (*)    Anion gap 17 (*)    All other components within normal limits  CBC WITH DIFFERENTIAL/PLATELET - Abnormal; Notable for the following:    Hemoglobin 15.2 (*)    RDW 11.3 (*)    All other components within normal limits  URINALYSIS, ROUTINE W REFLEX MICROSCOPIC (NOT AT [facility]) - Abnormal; Notable for the following:    Hgb urine dipstick SMALL (*)    Ketones, ur 40 (*)    Protein, ur 30 (*)    All other components within normal limits  TROPONIN I -  Abnormal; Notable for the following:    Troponin I 0.22 (*)    All other components within normal limits  HEPARIN LEVEL (UNFRACTIONATED) - Abnormal; Notable for the following:    Heparin Unfractionated 0.10 (*)    All other components within normal limits  CBC - Abnormal; Notable for the following:    RDW 11.3 (*)    Platelets 642 (*)    All other components within normal limits  TROPONIN I - Abnormal; Notable for the following:    Troponin I 0.20 (*)    All other components within normal limits  BASIC METABOLIC PANEL - Abnormal; Notable for the following:    Sodium 129 (*)    Chloride 86 (*)    Glucose, Bld 139 (*)    BUN 28 (*)    Calcium 8.8 (*)    All other components within normal limits  TROPONIN I - Abnormal; Notable for the following:    Troponin I 0.15 (*)    All other components within normal limits  I-STAT TROPOININ,  ED - Abnormal; Notable for the following:    Troponin i, poc 0.19 (*)    All other components within normal limits  I-STAT ARTERIAL BLOOD GAS, ED - Abnormal; Notable for the following:    pH, Arterial 7.218 (*)    pCO2 arterial 86.2 (*)    Bicarbonate 35.1 (*)    Acid-Base Excess 3.0 (*)    All other components within normal limits  I-STAT CHEM 8, ED - Abnormal; Notable for the following:    Sodium 129 (*)    Chloride 89 (*)    BUN 28 (*)    Glucose, Bld 112 (*)    Calcium, Ion 1.05 (*)    Hemoglobin 16.7 (*)    HCT 49.0 (*)    All other components within normal limits  I-STAT ARTERIAL BLOOD GAS, ED - Abnormal; Notable for the following:    pH, Arterial 7.315 (*)    pCO2 arterial 64.7 (*)    pO2, Arterial 74.0 (*)    Bicarbonate 33.0 (*)    Acid-Base Excess 4.0 (*)    All other components within normal limits  URINE CULTURE  URINE MICROSCOPIC-ADD ON  HEPARIN LEVEL (UNFRACTIONATED)  PROTIME-INR    Imaging Review Dg Chest Portable 1 View  08/25/2015  CLINICAL DATA:  Acute onset of shortness of breath. Initial encounter. EXAM: PORTABLE  CHEST 1 VIEW COMPARISON:  Chest radiograph performed 08/18/2015 FINDINGS: The lungs are well-aerated and clear. There is no evidence of focal opacification, pleural effusion or pneumothorax. Bilateral nipple shadows are seen. The cardiomediastinal silhouette is within normal limits. No acute osseous abnormalities are seen. IMPRESSION: No acute cardiopulmonary process seen. Electronically Signed   By: Garald Balding M.D.   On: 08/25/2015 21:16   I have personally reviewed and evaluated these images and lab results as part of my medical decision-making.   EKG Interpretation   Date/Time:  Monday August 25 2015 23:46:14 EDT Ventricular Rate:  105 PR Interval:  131 QRS Duration: 94 QT Interval:  381 QTC Calculation: 504 R Axis:   125 Text Interpretation:  Sinus tachycardia Biatrial enlargement Anterior  infarct, acute (LAD) Prolonged QT interval no dynamic changes compared to  previous ekg. ST elevation persistent in v3 and v4 Confirmed by NANAVATI,  MD, ANKIT [phone]) on 08/26/2015 12:53:01 AM      MDM   Final diagnoses:  Hypertensive emergency  COPD with acute exacerbation ([place])  Acute on chronic respiratory failure with hypoxia and hypercapnia ([place])    Pt recently d/c'd for COPD exacerbation and respiratory failure.  Pt with wheezing and HTN today.  CXR shows not obvious pulmonary edema, but hyperinflation.  BP tx with nitro gtt, pt WOB improved on BiPAP.    ABG shows respiratory acidosis.  Pt given doxycycline, and previously received solumedrol by EMS.  Discussed with CC team due to transient hypotension.  Nitro gtt was d/c'd.  Decreased PEEP and increased IPAP.  BP stabilized.  ECG on repeat shows ST elevation in V3, and V4 w/o reciprocal changes.  Pt w/o changes on repeat ECG.  Mild troponin elevation with repeat troponin only minimally increased.  Likely demand.  Pt discussed with cardiology and agree that this does not appear to be a STEMI, but likely related to demand ischemia,  tachycardia, and earlier HTN.  Pt admitted for further management.  Patient care was discussed with my attending, Dr. Kathrynn Humble.     Hoyle Sauer, MD 08/26/15 [place], MD 09/12/15 [phone]

## 2015-08-25 NOTE — ED Notes (Signed)
Dr. Nanavati back at the bedside.  

## 2015-08-25 NOTE — ED Notes (Signed)
Dr. Kathrynn Humble speaking with Dr. Ellyn Hack on the phone.

## 2015-08-25 NOTE — ED Notes (Signed)
Dr. Estanislado Spire back at the bedside

## 2015-08-26 ENCOUNTER — Encounter (HOSPITAL_COMMUNITY): Payer: Self-pay | Admitting: General Practice

## 2015-08-26 ENCOUNTER — Encounter (HOSPITAL_COMMUNITY)
Admission: EM | Disposition: A | Discharge status: Skilled Nursing Facility | Payer: Self-pay | Source: Home / Self Care | Attending: Student in an Organized Health Care Education/Training Program

## 2015-08-26 DIAGNOSIS — E43 Unspecified severe protein-calorie malnutrition: Secondary | ICD-10-CM

## 2015-08-26 DIAGNOSIS — J9621 Acute and chronic respiratory failure with hypoxia: Secondary | ICD-10-CM | POA: Insufficient documentation

## 2015-08-26 DIAGNOSIS — I10 Essential (primary) hypertension: Secondary | ICD-10-CM

## 2015-08-26 DIAGNOSIS — R778 Other specified abnormalities of plasma proteins: Secondary | ICD-10-CM | POA: Diagnosis present

## 2015-08-26 DIAGNOSIS — I249 Acute ischemic heart disease, unspecified: Secondary | ICD-10-CM | POA: Insufficient documentation

## 2015-08-26 DIAGNOSIS — J441 Chronic obstructive pulmonary disease with (acute) exacerbation: Secondary | ICD-10-CM | POA: Diagnosis present

## 2015-08-26 DIAGNOSIS — F4312 Post-traumatic stress disorder, chronic: Secondary | ICD-10-CM

## 2015-08-26 DIAGNOSIS — J9622 Acute and chronic respiratory failure with hypercapnia: Secondary | ICD-10-CM

## 2015-08-26 DIAGNOSIS — R7989 Other specified abnormal findings of blood chemistry: Secondary | ICD-10-CM

## 2015-08-26 HISTORY — PX: CARDIAC CATHETERIZATION: SHX172

## 2015-08-26 LAB — TROPONIN I
TROPONIN I: 0.15 ng/mL — AB (ref <0.031)
Troponin I: 0.22 ng/mL — ABNORMAL HIGH (ref <0.031)
Troponin I: 0.2 ng/mL — ABNORMAL HIGH (ref <0.031)

## 2015-08-26 LAB — BASIC METABOLIC PANEL
ANION GAP: 12 (ref 5–15)
BUN: 28 mg/dL — AB (ref 6–20)
CHLORIDE: 86 mmol/L — AB (ref 101–111)
CO2: 31 mmol/L (ref 22–32)
Calcium: 8.8 mg/dL — ABNORMAL LOW (ref 8.9–10.3)
Creatinine, Ser: 0.9 mg/dL (ref 0.44–1.00)
Glucose, Bld: 139 mg/dL — ABNORMAL HIGH (ref 65–99)
POTASSIUM: 4.5 mmol/L (ref 3.5–5.1)
SODIUM: 129 mmol/L — AB (ref 135–145)

## 2015-08-26 LAB — CBC
HEMATOCRIT: 40.7 % (ref 36.0–46.0)
HEMOGLOBIN: 13.7 g/dL (ref 12.0–15.0)
MCH: 31.6 pg (ref 26.0–34.0)
MCHC: 33.7 g/dL (ref 30.0–36.0)
MCV: 94 fL (ref 78.0–100.0)
Platelets: 642 10*3/uL — ABNORMAL HIGH (ref 150–400)
RBC: 4.33 MIL/uL (ref 3.87–5.11)
RDW: 11.3 % — ABNORMAL LOW (ref 11.5–15.5)
WBC: 6.9 10*3/uL (ref 4.0–10.5)

## 2015-08-26 LAB — PROTIME-INR
INR: 1.12 (ref 0.00–1.49)
PROTHROMBIN TIME: 14.6 s (ref 11.6–15.2)

## 2015-08-26 LAB — HEPARIN LEVEL (UNFRACTIONATED): HEPARIN UNFRACTIONATED: 0.1 [IU]/mL — AB (ref 0.30–0.70)

## 2015-08-26 LAB — MRSA PCR SCREENING: MRSA BY PCR: POSITIVE — AB

## 2015-08-26 SURGERY — LEFT HEART CATH AND CORONARY ANGIOGRAPHY

## 2015-08-26 MED ORDER — SODIUM CHLORIDE 0.9 % WEIGHT BASED INFUSION: 1.0000 mL/kg/h | INTRAVENOUS | Status: DC

## 2015-08-26 MED ORDER — ACETAMINOPHEN 325 MG PO TABS: 650.0000 mg | ORAL_TABLET | ORAL | Status: DC | PRN

## 2015-08-26 MED ORDER — HEPARIN BOLUS VIA INFUSION
2000.0000 [IU] | Freq: Once | INTRAVENOUS | Status: AC
  Administered 2015-08-26: 2000 [IU] via INTRAVENOUS
  Filled 2015-08-26: qty 2000

## 2015-08-26 MED ORDER — KETOROLAC TROMETHAMINE 15 MG/ML IJ SOLN
15.0000 mg | Freq: Three times a day (TID) | INTRAMUSCULAR | Status: DC | PRN
  Administered 2015-08-26: 15 mg via INTRAVENOUS
  Filled 2015-08-26 (×2): qty 1

## 2015-08-26 MED ORDER — SODIUM CHLORIDE 0.9 % WEIGHT BASED INFUSION: 1.0000 mL/kg/h | INTRAVENOUS | Status: AC

## 2015-08-26 MED ORDER — SODIUM CHLORIDE 0.9 % IV SOLN
INTRAVENOUS | Status: DC
  Administered 2015-08-26: 06:00:00 via INTRAVENOUS

## 2015-08-26 MED ORDER — BUSPIRONE HCL 15 MG PO TABS
7.5000 mg | ORAL_TABLET | Freq: Two times a day (BID) | ORAL | Status: DC
  Administered 2015-08-27 (×2): 7.5 mg via ORAL
  Filled 2015-08-26 (×7): qty 1

## 2015-08-26 MED ORDER — SODIUM CHLORIDE 0.9 % IJ SOLN: 3.0000 mL | INTRAMUSCULAR | Status: DC | PRN

## 2015-08-26 MED ORDER — ALPRAZOLAM 0.25 MG PO TABS
0.2500 mg | ORAL_TABLET | Freq: Two times a day (BID) | ORAL | Status: DC | PRN
  Administered 2015-08-26 – 2015-08-28 (×4): 0.25 mg via ORAL
  Filled 2015-08-26 (×4): qty 1

## 2015-08-26 MED ORDER — ESCITALOPRAM OXALATE 20 MG PO TABS
20.0000 mg | ORAL_TABLET | Freq: Every day | ORAL | Status: DC
  Administered 2015-08-26 – 2015-08-27 (×2): 20 mg via ORAL
  Filled 2015-08-26: qty 2
  Filled 2015-08-26 (×2): qty 1

## 2015-08-26 MED ORDER — SODIUM CHLORIDE 0.9 % IJ SOLN
3.0000 mL | Freq: Two times a day (BID) | INTRAMUSCULAR | Status: DC
  Administered 2015-08-26 – 2015-08-29 (×4): 3 mL via INTRAVENOUS

## 2015-08-26 MED ORDER — CHLORHEXIDINE GLUCONATE CLOTH 2 % EX PADS
6.0000 | MEDICATED_PAD | Freq: Every day | CUTANEOUS | Status: DC
  Administered 2015-08-26 – 2015-08-28 (×3): 6 via TOPICAL

## 2015-08-26 MED ORDER — MIDAZOLAM HCL 2 MG/2ML IJ SOLN
INTRAMUSCULAR | Status: AC
  Filled 2015-08-26: qty 4

## 2015-08-26 MED ORDER — SERTRALINE HCL 50 MG PO TABS: 50.0000 mg | ORAL_TABLET | Freq: Two times a day (BID) | ORAL | Status: DC

## 2015-08-26 MED ORDER — FENTANYL CITRATE (PF) 100 MCG/2ML IJ SOLN
INTRAMUSCULAR | Status: DC | PRN
  Administered 2015-08-26: 25 ug via INTRAVENOUS

## 2015-08-26 MED ORDER — ALBUTEROL SULFATE (2.5 MG/3ML) 0.083% IN NEBU: 2.5000 mg | INHALATION_SOLUTION | RESPIRATORY_TRACT | Status: DC | PRN

## 2015-08-26 MED ORDER — HEPARIN (PORCINE) IN NACL 100-0.45 UNIT/ML-% IJ SOLN
650.0000 [IU]/h | INTRAMUSCULAR | Status: DC
  Administered 2015-08-26: 500 [IU]/h via INTRAVENOUS
  Filled 2015-08-26: qty 250

## 2015-08-26 MED ORDER — CETYLPYRIDINIUM CHLORIDE 0.05 % MT LIQD
7.0000 mL | Freq: Two times a day (BID) | OROMUCOSAL | Status: DC
  Administered 2015-08-26 – 2015-08-29 (×4): 7 mL via OROMUCOSAL

## 2015-08-26 MED ORDER — IOHEXOL 350 MG/ML SOLN
INTRAVENOUS | Status: DC | PRN
  Administered 2015-08-26: 70 mL via INTRA_ARTERIAL

## 2015-08-26 MED ORDER — LIDOCAINE HCL (PF) 1 % IJ SOLN
INTRAMUSCULAR | Status: DC | PRN
  Administered 2015-08-26: 18:00:00

## 2015-08-26 MED ORDER — SODIUM CHLORIDE 0.9 % WEIGHT BASED INFUSION
3.0000 mL/kg/h | INTRAVENOUS | Status: DC
Start: 2015-08-27 — End: 2015-08-26

## 2015-08-26 MED ORDER — HEPARIN (PORCINE) IN NACL 100-0.45 UNIT/ML-% IJ SOLN
700.0000 [IU]/h | INTRAMUSCULAR | Status: DC
  Administered 2015-08-26: 650 [IU]/h via INTRAVENOUS
  Filled 2015-08-26: qty 250

## 2015-08-26 MED ORDER — MIDAZOLAM HCL 2 MG/2ML IJ SOLN
INTRAMUSCULAR | Status: DC | PRN
  Administered 2015-08-26: 2 mg via INTRAVENOUS

## 2015-08-26 MED ORDER — HEPARIN (PORCINE) IN NACL 2-0.9 UNIT/ML-% IJ SOLN
INTRAMUSCULAR | Status: AC
  Filled 2015-08-26: qty 1000

## 2015-08-26 MED ORDER — HEPARIN SODIUM (PORCINE) 1000 UNIT/ML IJ SOLN
INTRAMUSCULAR | Status: DC | PRN
  Administered 2015-08-26: 3000 [IU] via INTRAVENOUS

## 2015-08-26 MED ORDER — ASPIRIN 81 MG PO CHEW: 81.0000 mg | CHEWABLE_TABLET | ORAL | Status: DC

## 2015-08-26 MED ORDER — HEPARIN SODIUM (PORCINE) 1000 UNIT/ML IJ SOLN
INTRAMUSCULAR | Status: AC
  Filled 2015-08-26: qty 1

## 2015-08-26 MED ORDER — ASPIRIN 300 MG RE SUPP
300.0000 mg | Freq: Once | RECTAL | Status: AC
  Administered 2015-08-26: 300 mg via RECTAL
  Filled 2015-08-26: qty 1

## 2015-08-26 MED ORDER — VERAPAMIL HCL 2.5 MG/ML IV SOLN
INTRAVENOUS | Status: AC
  Filled 2015-08-26: qty 2

## 2015-08-26 MED ORDER — ASPIRIN 300 MG RE SUPP: 300.0000 mg | Freq: Every day | RECTAL | Status: DC

## 2015-08-26 MED ORDER — PREDNISONE 20 MG PO TABS: 40.0000 mg | ORAL_TABLET | Freq: Every day | ORAL | Status: DC

## 2015-08-26 MED ORDER — ENSURE ENLIVE PO LIQD
237.0000 mL | Freq: Two times a day (BID) | ORAL | Status: DC
Start: 2015-08-26 — End: 2015-08-27
  Filled 2015-08-26 (×4): qty 237

## 2015-08-26 MED ORDER — SODIUM CHLORIDE 0.9 % IV SOLN: 250.0000 mL | INTRAVENOUS | Status: DC | PRN

## 2015-08-26 MED ORDER — ASPIRIN 325 MG PO TABS
325.0000 mg | ORAL_TABLET | Freq: Every day | ORAL | Status: DC
  Administered 2015-08-26: 325 mg via ORAL
  Filled 2015-08-26 (×2): qty 1

## 2015-08-26 MED ORDER — MOMETASONE FURO-FORMOTEROL FUM 200-5 MCG/ACT IN AERO
2.0000 | INHALATION_SPRAY | Freq: Two times a day (BID) | RESPIRATORY_TRACT | Status: DC
  Administered 2015-08-26 – 2015-08-29 (×6): 2 via RESPIRATORY_TRACT
  Filled 2015-08-26 (×2): qty 8.8

## 2015-08-26 MED ORDER — SODIUM CHLORIDE 0.9 % IJ SOLN: 3.0000 mL | Freq: Two times a day (BID) | INTRAMUSCULAR | Status: DC

## 2015-08-26 MED ORDER — MUPIROCIN 2 % EX OINT
1.0000 "application " | TOPICAL_OINTMENT | Freq: Two times a day (BID) | CUTANEOUS | Status: DC
  Administered 2015-08-26 – 2015-08-29 (×7): 1 via NASAL
  Filled 2015-08-26 (×2): qty 22

## 2015-08-26 MED ORDER — LIDOCAINE HCL (PF) 1 % IJ SOLN
INTRAMUSCULAR | Status: AC
  Filled 2015-08-26: qty 30

## 2015-08-26 MED ORDER — ONDANSETRON HCL 4 MG/2ML IJ SOLN
4.0000 mg | Freq: Four times a day (QID) | INTRAMUSCULAR | Status: DC | PRN
  Administered 2015-08-28: 4 mg via INTRAVENOUS
  Filled 2015-08-26: qty 2

## 2015-08-26 MED ORDER — LORAZEPAM 2 MG/ML IJ SOLN
INTRAMUSCULAR | Status: AC
  Filled 2015-08-26: qty 1

## 2015-08-26 MED ORDER — BUTALBITAL-APAP-CAFFEINE 50-325-40 MG PO TABS
1.0000 | ORAL_TABLET | Freq: Two times a day (BID) | ORAL | Status: DC | PRN
  Administered 2015-08-26 – 2015-08-29 (×5): 1 via ORAL
  Filled 2015-08-26 (×5): qty 1

## 2015-08-26 MED ORDER — SODIUM CHLORIDE 0.9 % IV SOLN
INTRAVENOUS | Status: DC | PRN
  Administered 2015-08-26: 10 mL/h via INTRAVENOUS

## 2015-08-26 MED ORDER — IPRATROPIUM-ALBUTEROL 0.5-2.5 (3) MG/3ML IN SOLN
3.0000 mL | Freq: Four times a day (QID) | RESPIRATORY_TRACT | Status: DC
  Administered 2015-08-27 – 2015-08-29 (×9): 3 mL via RESPIRATORY_TRACT
  Filled 2015-08-26 (×11): qty 3

## 2015-08-26 MED ORDER — FENTANYL CITRATE (PF) 100 MCG/2ML IJ SOLN
INTRAMUSCULAR | Status: AC
  Filled 2015-08-26: qty 4

## 2015-08-26 MED ORDER — IPRATROPIUM-ALBUTEROL 0.5-2.5 (3) MG/3ML IN SOLN
3.0000 mL | Freq: Four times a day (QID) | RESPIRATORY_TRACT | Status: DC
  Filled 2015-08-26: qty 3

## 2015-08-26 MED ORDER — LORAZEPAM 2 MG/ML IJ SOLN
0.5000 mg | Freq: Once | INTRAMUSCULAR | Status: AC
  Administered 2015-08-26: 0.5 mg via INTRAVENOUS

## 2015-08-26 MED ORDER — IPRATROPIUM-ALBUTEROL 0.5-2.5 (3) MG/3ML IN SOLN
3.0000 mL | RESPIRATORY_TRACT | Status: DC
  Administered 2015-08-26 (×3): 3 mL via RESPIRATORY_TRACT
  Filled 2015-08-26 (×3): qty 3

## 2015-08-26 MED ORDER — MOMETASONE FURO-FORMOTEROL FUM 100-5 MCG/ACT IN AERO: 2.0000 | INHALATION_SPRAY | Freq: Two times a day (BID) | RESPIRATORY_TRACT | Status: DC

## 2015-08-26 SURGICAL SUPPLY — 12 items
CATH INFINITI 5 FR JL3.5 (CATHETERS) ×3 IMPLANT
CATH INFINITI 5FR ANG PIGTAIL (CATHETERS) ×3 IMPLANT
CATH INFINITI JR4 5F (CATHETERS) ×3 IMPLANT
DEVICE RAD COMP TR BAND LRG (VASCULAR PRODUCTS) ×3 IMPLANT
GLIDESHEATH SLEND SS 6F .021 (SHEATH) ×3 IMPLANT
KIT HEART LEFT (KITS) ×3 IMPLANT
PACK CARDIAC CATHETERIZATION (CUSTOM PROCEDURE TRAY) ×3 IMPLANT
SYR MEDRAD MARK V 150ML (SYRINGE) ×3 IMPLANT
TRANSDUCER W/STOPCOCK (MISCELLANEOUS) ×3 IMPLANT
TUBING CIL FLEX 10 FLL-RA (TUBING) ×3 IMPLANT
WIRE HI TORQ VERSACORE-J 145CM (WIRE) ×3 IMPLANT
WIRE SAFE-T 1.5MM-J .035X260CM (WIRE) ×3 IMPLANT

## 2015-08-26 NOTE — ED Provider Notes (Signed)
I saw and evaluated the patient, reviewed the resident's note and I agree with the findings and plan.   EKG Interpretation   Date/Time:  Monday August 25 2015 20:55:49 EDT Ventricular Rate:  137 PR Interval:  135 QRS Duration: 107 QT Interval:  284 QTC Calculation: 429 R Axis:   -116 Text Interpretation:  Poor quality data, artifact Sinus tachycardia LAE,  consider biatrial enlargement Anterior infarct, acute (LAD) ST elevation,  consider inferior injury Nonspecific ST abnormality Confirmed by Kathrynn Humble,  MD, Maysen Sudol [phone]) on 08/25/2015 10:57:09 PM     EKG Interpretation  Date/Time:  Monday August 25 2015 23:06:42 EDT Ventricular Rate:  103 PR Interval:  127 QRS Duration: 90 QT Interval:  369 QTC Calculation: 483 R Axis:   109 Text Interpretation:  Sinus tachycardia Biatrial enlargement Anterior infarct, acute (LAD) ST elevation in v2-v4 without any reciprocal changes or dynamic changes compared to previous ekg Confirmed by Kathrynn Humble, MD, Thelma Comp [phone]) on 08/26/2015 12:47:49 AM          PT comes in with cc of dib. She had no chest pain when we first saw her. She was in resp distress, had poor air movement, and MAP of 200 mmhg. She had no JVD.  WE started bipap and started her on nitro drip. CXR is clear. Soon her BP dropped precipitously (nitro titrated upto 95 mvc/min at that time), and nitro was stopped. Repeat ABG is improved. PT's BP is labile now - SBP in the low 90s and 100s. Her mental status has remained proper through out and there is no perfusion issues.  Initial EKG had way too much artifact. Her istat trop was elevated, we think it's from demand ischemia. We repeated the EKG once she was more stable - and we do see ST elevation in v2-v4 that are slightly concerning. There is no reciprocal changes. I did a repeat ekg and it was unchanged.  I spoke with STEMI MD, Dr. Ellyn Hack. We discussed the case thoroughly, including patient's presentation, known cardiac hx.  (Pt on repeat questioning states that she does have "mild chest pain" in the middle of her chest that is non radiating and is dull. Patient has been having intermittent chest pain, sometimes exertional, no recent cardiac workup). Dr. Ellyn Hack was not convinced with the EKG, and wants to make the decision to be more clinical. We decided to get more serial EKG, and they continue to have no dynamic changes.   I spoke with Dr. Ellyn Hack again at 12:40 am. Pt was reassessed by me again around 12:20 and we did get an ekg (4th ekg), and the ekg is still unchanged. Pt is asleep. Still on bipap. Trop is 0.22. Both Dr. Ellyn Hack and I believe that pt likely needs a further investigation, but not an emergent cath activation.  Cards team consulted. Heparin started. Rectal ASA given.  CRITICAL CARE Performed by: Varney Biles   Total critical care time: 65 minutes  Critical care time was exclusive of separately billable procedures and treating other patients.  Critical care was necessary to treat or prevent imminent or life-threatening deterioration.  Critical care was time spent personally by me on the following activities: development of treatment plan with patient and/or surrogate as well as nursing, discussions with consultants, evaluation of patient's response to treatment, examination of patient, obtaining history from patient or surrogate, ordering and performing treatments and interventions, ordering and review of laboratory studies, ordering and review of radiographic studies, pulse oximetry and re-evaluation of patient's condition.  Varney Biles, MD 08/26/15 [phone]

## 2015-08-26 NOTE — Interval H&P Note (Signed)
Cath Lab Visit (complete for each Cath Lab visit)  Clinical Evaluation Leading to the Procedure:   ACS: Yes.    Non-ACS:    Anginal Classification: CCS IV  Anti-ischemic medical therapy: Minimal Therapy (1 class of medications)  Non-Invasive Test Results: No non-invasive testing performed  Prior CABG: No previous CABG      History and Physical Interval Note:  08/26/2015 5:15 PM  Megan [name]  has presented today for surgery, with the diagnosis of cp  The various methods of treatment have been discussed with the patient and family. After consideration of risks, benefits and other options for treatment, the patient has consented to  Procedure(s): Left Heart Cath and Coronary Angiography (N/A) as a surgical intervention .  The patient's history has been reviewed, patient examined, no change in status, stable for surgery.  I have reviewed the patient's chart and labs.  Questions were answered to the patient's satisfaction.     Mana Haberl S.

## 2015-08-26 NOTE — H&P (Signed)
Date: 08/26/2015               Patient Name:  Megan [name] MRN: [id number]  DOB: 09-21-1951 Age / Sex: 64 y.o., female   PCP: Pcp Not In System         Medical Service: Internal Medicine Teaching Service         Attending Physician: Dr. Axel Filler, MD    First Contact: Dr. Viviano Simas, MD, PhD Pager: [phone]  Second Contact: Dr. Michail Jewels, MD Pager: [phone]       After Hours (After 5p/  First Contact Pager: [phone]  weekends / holidays): Second Contact Pager: [phone]   Chief Complaint: Shortness of Breath  History of Present Illness:   Megan [name] is a 64 year old woman with a past medical history of COPD (Stage 4D), HTN, PTSD, lung nodules concerning for NSCLC, and anxiety who presents with worsening shortness of breath. She was recently admitted for the same problem (10/18 - 10), during which her COPD regimen was adjusted to full dose Advair, scheduled Incruse Ellipta, and daily Azithromycin. She was also discharged on 40 mg of prednisone daily, decreasing to 20 mg daily on 10/26. with tapering to be scheduled at her [facility] follow-up appointment on 11/1, and a follow-up appointment with Dr. Vaughan Browner on 11/1 for workup of her possibly malignancy pulmonary nodules. Since discharge, she has been unable to pick up her Ellipta Incruse, but she has been adherent with all her other medications. Her dyspnea has also progressively worsened, associated with wheezing, and has continued required up to 2.5L home O2. She endorses chills, sore throat, sick contacts, productive cough (green sputum), anxiety, nausea, headaches. She denied fever, blackouts, vomiting, diarrhea, constipation, skin changes, or vision changes. This is her fifth hospitalization for COPD exacerbation this year. When we discussed with Megan [name] what her goals were, she said she would like to be stronger so she could be around her family. She did not want to discuss Code Status at this time. She is a former smoker,  former drinker.  In the ED, she was noted to have ST elevations in V3-V4 without depressions in the reciprocal leads. A Troponin I was 0.22. She was started on IV heparin 500U/hr and nitroglycerine infusion. Chest X-ray did not demonstrate any acute changes.  Meds: Current Facility-Administered Medications  Medication Dose Route Frequency Provider Last Rate Last Dose  . aspirin suppository 300 mg  300 mg Rectal Daily Rushil Patel V, MD      . budesonide (PULMICORT) nebulizer solution 0.5 mg  0.5 mg Nebulization BID Corey Harold, NP   0.5 mg at 08/26/15 0000  . doxycycline (VIBRA-TABS) tablet 100 mg  100 mg Oral Q12H Corey Harold, NP      . heparin ADULT infusion 100 units/mL (25000 units/250 mL)  500 Units/hr Intravenous Continuous Laren Everts, RPH 5 mL/hr at 08/26/15 0124 500 Units/hr at 08/26/15 0124  . ipratropium-albuterol (DUONEB) 0.5-2.5 (3) MG/3ML nebulizer solution 3 mL  3 mL Nebulization QID Kara Mead V, MD      . LORazepam (ATIVAN) tablet 0.5 mg  0.5 mg Oral Once Hoyle Sauer, MD      . methylPREDNISolone sodium succinate (SOLU-MEDROL) 125 mg/2 mL injection 60 mg  60 mg Intravenous 3 times per day Corey Harold, NP   60 mg at 08/26/15 0038   Current Outpatient Prescriptions  Medication Sig Dispense Refill  . acetaminophen (TYLENOL) 500 MG tablet Take 500 mg by mouth every  6 (six) hours as needed for mild pain.    Marland Kitchen albuterol (PROVENTIL HFA;VENTOLIN HFA) 108 (90 BASE) MCG/ACT inhaler Inhale 1 puff into the lungs every 4 (four) hours as needed for wheezing or shortness of breath. 1 Inhaler 0  . azithromycin (ZITHROMAX) 250 MG tablet Take 1 tablet (250 mg total) by mouth daily. 30 tablet 0  . busPIRone (BUSPAR) 7.5 MG tablet Take 1 tablet (7.5 mg total) by mouth 2 (two) times daily. 60 tablet 0  . butalbital-acetaminophen-caffeine (FIORICET) 50-325-40 MG tablet Take 1-2 tablets by mouth every 6 (six) hours as needed for headache. 20 tablet 0  . Fluticasone-Salmeterol (ADVAIR)  500-50 MCG/DOSE AEPB Inhale 1 puff into the lungs 2 (two) times daily. 60 each 0  . lisinopril (PRINIVIL,ZESTRIL) 2.5 MG tablet Take 2.5 mg by mouth daily.    . predniSONE (DELTASONE) 20 MG tablet 10/22-10/25: Take (40 mg) 2 tablets daily; 10/26: take (20 mg) 1 tablet daily until followup 35 tablet 0  . sertraline (ZOLOFT) 50 MG tablet Take 50 mg by mouth 2 (two) times daily.    Marland Kitchen Umeclidinium Bromide (INCRUSE ELLIPTA) 62.5 MCG/INH AEPB Inhale 1 puff into the lungs daily. 30 each 0    Allergies: Allergies as of 08/25/2015 - Review Complete 08/25/2015  Allergen Reaction Noted  . Penicillins Rash 08/18/2015   Past Medical History  Diagnosis Date  . Myocardial infarction ([place])   . CHF (congestive heart failure) ([place])   . COPD (chronic obstructive pulmonary disease) ([place])   . Shortness of breath dyspnea   . Depression   . GERD (gastroesophageal reflux disease)   . History of hiatal hernia   . Headache   . Anemia   . Essential hypertension 08/19/2015  . Anxiety state 08/20/2015   Past Surgical History  Procedure Laterality Date  . Cardiac catheterization     No family history on file. Social History   Social History  . Marital Status: Widowed    Spouse Name: N/A  . Number of Children: N/A  . Years of Education: N/A   Occupational History  . Not on file.   Social History Main Topics  . Smoking status: Former Smoker    Quit date: 08/18/2000  . Smokeless tobacco: Never Used  . Alcohol Use: No  . Drug Use: Not on file  . Sexual Activity: No   Other Topics Concern  . Not on file   Social History Narrative    Review of Systems: Negative Except per HPI  Physical Exam: Blood pressure 145/87, pulse 104, temperature 99.5 F (37.5 C), temperature source Oral, resp. rate 19, height 5' 2.5" (1.588 m), weight 95 lb 14.4 oz (43.5 kg), SpO2 97 %. General: Initially sleeping. Thin appearing woman lying in bed, BiPAP in place HEENT: EOMI, PERRL, pupils slightly dilated, no  JVD Cardiovascular: Mildly tachycardic. Regular  rhythm without murmurs, rubs, or gallops Pulmonary: Expiratory wheezes in anterior lung fields.  Abdomen: Soft, Nontender, Non-distended. Normal bowel sounds Musculoskeletal: Tenderness to palpation of left ribs without evidence of fracture Extremities: No clubbing, cyanosis, or edema. 2+ DP pulses Neurological: AAOx3. Face symmetric. Shrugs shoulders symmetrically. 5/5 strength in all extremities.  Lab results: Basic Metabolic Panel:  Recent Labs  08/25/15 2116 08/25/15 2124  NA 129* 129*  K 4.9 4.7  CL 86* 89*  CO2 26  --   GLUCOSE 110* 112*  BUN 20 28*  CREATININE 0.87 0.90  CALCIUM 9.1  --    Liver Function Tests:  Recent Labs  08/25/15 2116  AST 31  ALT 21  ALKPHOS 67  BILITOT 0.9  PROT 7.1  ALBUMIN 4.1   CBC:  Recent Labs  08/25/15 2116 08/25/15 2124  WBC 9.9  --   NEUTROABS 6.5  --   HGB 15.2* 16.7*  HCT 43.1 49.0*  MCV 93.1  --   PLT PLATELET CLUMPS NOTED ON SMEAR, COUNT APPEARS ADEQUATE  --    Cardiac Enzymes:  Recent Labs  08/26/15 2350  TROPONINI 0.22*   Urinalysis:  Recent Labs  08/25/15 2130  COLORURINE YELLOW  LABSPEC 1.019  PHURINE 6.0  GLUCOSEU NEGATIVE  HGBUR SMALL*  BILIRUBINUR NEGATIVE  KETONESUR 40*  PROTEINUR 30*  UROBILINOGEN 0.2  NITRITE NEGATIVE  LEUKOCYTESUR NEGATIVE    Imaging results:  Dg Chest Portable 1 View  08/25/2015  CLINICAL DATA:  Acute onset of shortness of breath. Initial encounter. EXAM: PORTABLE CHEST 1 VIEW COMPARISON:  Chest radiograph performed 08/18/2015 FINDINGS: The lungs are well-aerated and clear. There is no evidence of focal opacification, pleural effusion or pneumothorax. Bilateral nipple shadows are seen. The cardiomediastinal silhouette is within normal limits. No acute osseous abnormalities are seen. IMPRESSION: No acute cardiopulmonary process seen. Electronically Signed   By: Garald Balding M.D.   On: 08/25/2015 21:16    Other  results: V3-V4 ST elevation without reciprocal depressions. No dynamic changes.  Assessment & Plan by Problem:  Acute on Chronic Respiratory Failure (COPD Stage 4D): It is possible that not picking up her LAMA may have led to worsening SOB, but it is also possible that her overall respiratory status is continuing to decline. Once she is off BiPAP and comfortable, she will require a thorough goals of care discussion given her poor prognosis. - Doxycycline 100 mg BID - Duoneb q4h - Solumedrol 60 mg IV q8h - Albuterol  - Pulmicort nebulizer BID - BiPAP on for 4 hours, off for 1  Pulmonary Nodules, Probably NSCLC: Follow-up with Dr. Concepcion Living from [facility] on 11/1  ST Elevation and Elevated Troponins: After viewing EKG on 10/18, there appear to be new ST elevations, although they may have been present earlier and possibly obscured by tachycardia. Elevated troponins could be related to demand ischemia. Per ED Physician Dr. Estanislado Spire, Cardiology recommended heparinization. - Heparin IV - ASA 300 mg suppository daily - Trending troponins  DVT Prophylaxis: Receiving Heparin  Code Status: Full  Dispo: Disposition is deferred at this time, awaiting improvement of current medical problems. Anticipated discharge in approximately 2-3 day(s).   The patient does not have a current PCP (Pcp Not In System) and does need an [facility] hospital follow-up appointment after discharge.  The patient does have transportation limitations that hinder transportation to clinic appointments.  Signed: Liberty Handy, MD 08/26/2015, 2:11 AM

## 2015-08-26 NOTE — Progress Notes (Signed)
Patient was transported to 2C05 from ER on Bipap with no complications.

## 2015-08-26 NOTE — Progress Notes (Signed)
Subjective: Patient admitted overnight.  She continues to endorse SOB at rest, mildly improved with nebulizer treatment.  She states she ran out of her Xanax which added to her dyspnea.  She also complains of full body pain, consistent with her anxiety attacks, as well as deconditioning.    Objective: Vital signs in last 24 hours: Filed Vitals:   08/26/15 0400 08/26/15 0730 08/26/15 0832 08/26/15 0844  BP: 148/84   134/80  Pulse:      Temp:    98.3 F (36.8 C)  TempSrc:    Oral  Resp:      Height:      Weight:  91 lb (41.277 kg)    SpO2: 94%  94%    Weight change:   Intake/Output Summary (Last 24 hours) at 08/26/15 1135 Last data filed at 08/26/15 0900  Gross per 24 hour  Intake    505 ml  Output    300 ml  Net    205 ml   Physical Exam  Constitutional: She is oriented to person, place, and time.  Frail, appears older than stated age. Moderate respiratory distress.  HENT:  Head: Normocephalic and atraumatic.  Eyes: EOM are normal. No scleral icterus.  Neck: No JVD present. No tracheal deviation present.  Cardiovascular: Normal heart sounds and intact distal pulses.   Tachycardic. Regular rhythm.  Pulmonary/Chest:  Decreased air movement throughout. No wheezes appreciated following duoneb.  Increased WOB with use of accessory muscles.  Supraclavicular retractions.  Moderate respiratory distress.  Abdominal: Soft. She exhibits no distension. There is no tenderness. There is no rebound and no guarding.  Musculoskeletal: She exhibits no edema.  No calf swelling or tenderness.  Neurological: She is alert and oriented to person, place, and time.  Skin: Skin is warm and dry. No rash noted.    Lab Results: Basic Metabolic Panel:  Recent Labs Lab 08/25/15 2116 08/25/15 2124 08/26/15 0653  NA 129* 129* 129*  K 4.9 4.7 4.5  CL 86* 89* 86*  CO2 26  --  31  GLUCOSE 110* 112* 139*  BUN 20 28* 28*  CREATININE 0.87 0.90 0.90  CALCIUM 9.1  --  8.8*   Liver Function  Tests:  Recent Labs Lab 08/22/15 0330 08/25/15 2116  AST 27 31  ALT 18 21  ALKPHOS 71 67  BILITOT 0.8 0.9  PROT 6.8 7.1  ALBUMIN 3.5 4.1   No results for input(s): LIPASE, AMYLASE in the last 168 hours. No results for input(s): AMMONIA in the last 168 hours. CBC:  Recent Labs Lab 08/25/15 2116 08/25/15 2124 08/26/15 1050  WBC 9.9  --  6.9  NEUTROABS 6.5  --   --   HGB 15.2* 16.7* 13.7  HCT 43.1 49.0* 40.7  MCV 93.1  --  94.0  PLT PLATELET CLUMPS NOTED ON SMEAR, COUNT APPEARS ADEQUATE  --  642*   Cardiac Enzymes:  Recent Labs Lab 08/26/15 0653 08/26/15 2350  TROPONINI 0.20* 0.22*   BNP: No results for input(s): PROBNP in the last 168 hours. D-Dimer: No results for input(s): DDIMER in the last 168 hours. CBG: No results for input(s): GLUCAP in the last 168 hours. Hemoglobin A1C: No results for input(s): HGBA1C in the last 168 hours. Fasting Lipid Panel: No results for input(s): CHOL, HDL, LDLCALC, TRIG, CHOLHDL, LDLDIRECT in the last 168 hours. Thyroid Function Tests: No results for input(s): TSH, T4TOTAL, FREET4, T3FREE, THYROIDAB in the last 168 hours. Coagulation: No results for input(s): LABPROT, INR in the  last 168 hours. Anemia Panel: No results for input(s): VITAMINB12, FOLATE, FERRITIN, TIBC, IRON, RETICCTPCT in the last 168 hours. Urine Drug Screen: Drugs of Abuse  No results found for: LABOPIA, COCAINSCRNUR, LABBENZ, AMPHETMU, THCU, LABBARB  Alcohol Level: No results for input(s): ETH in the last 168 hours. Urinalysis:  Recent Labs Lab 08/25/15 2130  COLORURINE YELLOW  LABSPEC 1.019  PHURINE 6.0  GLUCOSEU NEGATIVE  HGBUR SMALL*  BILIRUBINUR NEGATIVE  KETONESUR 40*  PROTEINUR 30*  UROBILINOGEN 0.2  NITRITE NEGATIVE  LEUKOCYTESUR NEGATIVE   Misc. Labs:   Micro Results: Recent Results (from the past 240 hour(s))  Urine culture     Status: None (Preliminary result)   Collection Time: 08/25/15  9:30 PM  Result Value Ref Range Status    Specimen Description URINE, CATHETERIZED  Final   Special Requests NONE  Final   Culture NO GROWTH < 12 HOURS  Final   Report Status PENDING  Incomplete  MRSA PCR Screening     Status: Abnormal   Collection Time: 08/26/15  3:29 AM  Result Value Ref Range Status   MRSA by PCR POSITIVE (A) NEGATIVE Final    Comment:        The GeneXpert MRSA Assay (FDA approved for NASAL specimens only), is one component of a comprehensive MRSA colonization surveillance program. It is not intended to diagnose MRSA infection nor to guide or monitor treatment for MRSA infections. RESULT CALLED TO, READ BACK BY AND VERIFIED WITH: A PETTIFORD '@0530'$  08/26/15 MKELLY    Studies/Results: Dg Chest Portable 1 View  08/25/2015  CLINICAL DATA:  Acute onset of shortness of breath. Initial encounter. EXAM: PORTABLE CHEST 1 VIEW COMPARISON:  Chest radiograph performed 08/18/2015 FINDINGS: The lungs are well-aerated and clear. There is no evidence of focal opacification, pleural effusion or pneumothorax. Bilateral nipple shadows are seen. The cardiomediastinal silhouette is within normal limits. No acute osseous abnormalities are seen. IMPRESSION: No acute cardiopulmonary process seen. Electronically Signed   By: Garald Balding M.D.   On: 08/25/2015 21:16   Medications: I have reviewed the patient's current medications. Scheduled Meds: . aspirin  325 mg Oral Daily  . busPIRone  7.5 mg Oral BID  . Chlorhexidine Gluconate Cloth  6 each Topical Q0600  . escitalopram  20 mg Oral Daily  . feeding supplement (ENSURE ENLIVE)  237 mL Oral BID BM  . ipratropium-albuterol  3 mL Nebulization Q4H  . methylPREDNISolone (SOLU-MEDROL) injection  60 mg Intravenous 3 times per day  . mometasone-formoterol  2 puff Inhalation BID  . mupirocin ointment  1 application Nasal BID   Continuous Infusions: . heparin 500 Units/hr (08/26/15 0124)   PRN Meds:.albuterol, ALPRAZolam, ketorolac Assessment/Plan: Principal Problem:    Respiratory failure (HCC) Active Problems:   COPD with acute exacerbation (HCC)   Elevated troponin   Essential hypertension   Protein-calorie malnutrition, severe   Chronic post-traumatic stress disorder (PTSD)   Acute exacerbation of chronic obstructive pulmonary disease (COPD) ([place])  Megan [name] is a 64 year old woman with a past medical history of COPD (Stage 4D), HTN, PTSD, lung nodules concerning for NSCLC, and anxiety who presents with worsening shortness of breath.  Acute on Chronic Respiratory Failure (COPD Stage 4D): Patient presents with worsening DOE and at rest.  She admits that running out of her Xanax made her dyspnea worse.  She has been taking her Prednisone, Azithromycin, Advair, and Albuterol.  She had not picked up her Incruse. It is possible that not picking up her LAMA  may have led to worsening SOB, but it is also possible that her overall respiratory status is continuing to decline, further worsened by her anxiety.  In addition, ECG findings and troponinemia concerning for acute cardiac cause of dyspena.  Will consult Palliative Care for [place] discussion.  Patient has unreal expectations for her functionality given her poor pulmonary status and deconditioning.  Will restart Azithromycin on discharge. - Stop Doxycycline 100 mg BID - Duoneb q4h - Solumedrol 60 mg IV q8h, Prednisone tomorrow - Albuterol  - Dulera BID - BiPAP PRN  ST Elevation and Elevated Troponins: After viewing EKG on 10/18, there appear to be new ST elevations. Patient had previously been in sinus tach without ST changes.  Elevated troponins could be related to demand ischemia vs ACS vs PE.  No signs of PE on ECG, but patient is sinus tach, low SpO2, recently hospitalized, and relatively immobile. Per ED Physician Dr. Estanislado Spire, Cardiology recommended heparinization with possible cath today.  If cardiac cath cannot be performed today, will obtain CTA.  Patient is already on heparin.  - Heparin IV - ASA 300 mg  daily - Trending troponins - Cardiology consult  Pulmonary Nodules, Probably NSCLC: Follow-up with Dr. Concepcion Living from [facility] on 11/1  PTSD/Anxiety: chronic. Last evenings episode seems most consistent with anxiety attack during manipulation of IV. Attack alleviated with Xanax and Fioricet, both of which are GABAergic. Patient is on max dosing of Lexapro. Will add Buspar for added longer term benefit. - Escitalopram 20 mg daily - Xanax 0.25 mg BID PRN - Buspar 7.5 mg BID for added anxiety benefit - Ramelteon  Protein Malnutrition: Ensure  DVT Prophylaxis: Receiving Heparin  Code Status: Full  Dispo: Disposition is deferred at this time, awaiting improvement of current medical problems.  Anticipated discharge in approximately 2-3 day(s).   The patient does not have a current PCP (Pcp Not In System) and does not need an [facility] hospital follow-up appointment after discharge.  The patient does not have transportation limitations that hinder transportation to clinic appointments.  .Services Needed at time of discharge: Y = Yes, Blank = No PT:   OT:   RN:   Equipment:   Other:     LOS: 1 day   Iline Oven, MD 08/26/2015, 11:35 AM

## 2015-08-26 NOTE — Progress Notes (Signed)
ANTICOAGULATION CONSULT NOTE - Initial Consult  Pharmacy Consult for heparin  Indication: PE  Allergies  Allergen Reactions  . Penicillins Rash    Has patient had a PCN reaction causing immediate rash, facial/tongue/throat swelling, SOB or lightheadedness with hypotension: {Yes Has patient had a PCN reaction causing severe rash involving mucus membranes or skin necrosis:NO Has patient had a PCN reaction that required hospitalization {Yes Has patient had a PCN reaction occurring within the last 10 years:NO If all of the above answers are "NO", then may proceed with Cephalosporin use.    Patient Measurements: Height: 5' 2.5" (158.8 cm) Weight: 84 lb 3.2 oz (38.193 kg) IBW/kg (Calculated) : 51.25  Vital Signs: Temp: 97.8 F (36.6 C) (10/25 1823) Temp Source: Oral (10/25 1823) BP: 115/59 mmHg (10/25 1823) Pulse Rate: 113 (10/25 1823)  Labs:  Recent Labs  08/25/15 2116 08/25/15 2124 08/26/15 0653 08/26/15 1050 08/26/15 1505 08/26/15 2350  HGB 15.2* 16.7*  --  13.7  --   --   HCT 43.1 49.0*  --  40.7  --   --   PLT PLATELET CLUMPS NOTED ON SMEAR, COUNT APPEARS ADEQUATE  --   --  642*  --   --   LABPROT  --   --   --   --  14.6  --   INR  --   --   --   --  1.12  --   HEPARINUNFRC  --   --   --  0.10*  --   --   CREATININE 0.87 0.90 0.90  --   --   --   TROPONINI  --   --  0.20* 0.15*  --  0.22*    Estimated Creatinine Clearance: 39.1 mL/min (by C-G formula based on Cr of 0.9).   Medical History: Past Medical History  Diagnosis Date  . Myocardial infarction ([place])   . CHF (congestive heart failure) ([employer])   . COPD (chronic obstructive pulmonary disease) ([place])   . Shortness of breath dyspnea   . Depression   . GERD (gastroesophageal reflux disease)   . History of hiatal hernia   . Headache   . Anemia   . Essential hypertension 08/19/2015  . Anxiety state 08/20/2015    Assessment: 64yo female discharged from hospital 3d ago, now c/o increased SOB w/ h/o lung ca  and COPD. On heparin per pharmacy for elevated troponins and stopped after cath. Now to restart heparin for PE. Hgb stable, plts elevated at 642. No s/s of bleed.  Goal of Therapy:  Heparin level 0.3-0.7 units/ml Monitor platelets by anticoagulation protocol: Yes   Plan:  No heparin BOLUS after cath Restart heparin gtt at 650 units/hr Check 6 hr HL Monitor daily HL, CBC, s/s of bleed  Elenor Quinones, PharmD Clinical Pharmacist Pager [phone] 08/26/2015 7:30 PM

## 2015-08-26 NOTE — H&P (View-Only) (Signed)
CARDIOLOGY CONSULT NOTE   Patient ID: Megan [name] MRN: [id number], DOB/AGE: 1952/62/09   Admit date: 08/25/2015 Date of Consult: 08/26/2015  Primary Physician: Pcp Not In System Primary Cardiologist: none  Reason for consult:  Elevated troponin  Problem List  Past Medical History  Diagnosis Date  . Myocardial infarction ([place])   . CHF (congestive heart failure) ([place])   . COPD (chronic obstructive pulmonary disease) ([place])   . Shortness of breath dyspnea   . Depression   . GERD (gastroesophageal reflux disease)   . History of hiatal hernia   . Headache   . Anemia   . Essential hypertension 08/19/2015  . Anxiety state 08/20/2015    Past Surgical History  Procedure Laterality Date  . Cardiac catheterization      Allergies  Allergies  Allergen Reactions  . Penicillins Rash    Has patient had a PCN reaction causing immediate rash, facial/tongue/throat swelling, SOB or lightheadedness with hypotension: {Yes Has patient had a PCN reaction causing severe rash involving mucus membranes or skin necrosis:NO Has patient had a PCN reaction that required hospitalization {Yes Has patient had a PCN reaction occurring within the last 10 years:NO If all of the above answers are "NO", then may proceed with Cephalosporin use.   HPI   64 year old woman with stage 4D COPD, cachectic appearing, recently discharged for COPD exacerbation developed progressive shortness of breath and chest tightness over the last 36 hours leading to readmission. She reports that she was able to obtain all of her usual medications at discharge except the [place], but still was taking prednisone. Yesterday she began to have worsening shortness of breath at rest, could not get out of bed. Also noted chest tightness and mild pain in her epigastrium recently. No new sputum production or fevers. No leg swelling.  The patient states that she has a prior history of MI but doesn't remember any details. There are no  records in our system. Currently she continues to have retrosternal chest tightness/pressure and also sharp pain on deep inspiration.  Cardiology was called for concern of new ST elevation in the anterior leads and mild troponin elevation.   Inpatient Medications  . aspirin  325 mg Oral Daily  . busPIRone  7.5 mg Oral BID  . Chlorhexidine Gluconate Cloth  6 each Topical Q0600  . escitalopram  20 mg Oral Daily  . feeding supplement (ENSURE ENLIVE)  237 mL Oral BID BM  . ipratropium-albuterol  3 mL Nebulization Q4H  . methylPREDNISolone (SOLU-MEDROL) injection  60 mg Intravenous 3 times per day  . mometasone-formoterol  2 puff Inhalation BID  . mupirocin ointment  1 application Nasal BID   Family History History reviewed. No pertinent family history.   Social History Social History   Social History  . Marital Status: Widowed    Spouse Name: N/A  . Number of Children: N/A  . Years of Education: N/A   Occupational History  . Not on file.   Social History Main Topics  . Smoking status: Former Smoker    Quit date: 08/18/2000  . Smokeless tobacco: Never Used  . Alcohol Use: No  . Drug Use: No  . Sexual Activity: No   Other Topics Concern  . Not on file   Social History Narrative    Review of Systems  General:  No chills, fever, night sweats or weight changes.  Cardiovascular:  No chest pain, dyspnea on exertion, edema, orthopnea, palpitations, paroxysmal nocturnal dyspnea. Dermatological: No rash, lesions/masses  Respiratory: No cough, dyspnea Urologic: No hematuria, dysuria Abdominal:   No nausea, vomiting, diarrhea, bright red blood per rectum, melena, or hematemesis Neurologic:  No visual changes, wkns, changes in mental status. All other systems reviewed and are otherwise negative except as noted above.  Physical Exam  Blood pressure 134/80, pulse 103, temperature 98.3 F (36.8 C), temperature source Oral, resp. rate 34, height 5' 2.5" (1.588 m), weight 91 lb  (41.277 kg), SpO2 94 %.  General: Pleasant, NAD Psych: Normal affect. Neuro: Alert and oriented X 3. Moves all extremities spontaneously. HEENT: Normal  Neck: Supple without bruits or JVD. Lungs:  Resp regular and unlabored, CTA. Heart: RRR no s3, s4, or murmurs. Abdomen: Soft, non-tender, non-distended, BS + x 4.  Extremities: No clubbing, cyanosis or edema. DP/PT/Radials 2+ and equal bilaterally.  Labs  Recent Labs  08/26/15 0653 08/26/15 2350  TROPONINI 0.20* 0.22*   Lab Results  Component Value Date   WBC 6.9 08/26/2015   HGB 13.7 08/26/2015   HCT 40.7 08/26/2015   MCV 94.0 08/26/2015   PLT 642* 08/26/2015    Recent Labs Lab 08/25/15 2116  08/26/15 0653  NA 129*  < > 129*  K 4.9  < > 4.5  CL 86*  < > 86*  CO2 26  --  31  BUN 20  < > 28*  CREATININE 0.87  < > 0.90  CALCIUM 9.1  --  8.8*  PROT 7.1  --   --   BILITOT 0.9  --   --   ALKPHOS 67  --   --   ALT 21  --   --   AST 31  --   --   GLUCOSE 110*  < > 139*  < > = values in this interval not displayed. No results found for: CHOL, HDL, LDLCALC, TRIG No results found for: DDIMER Invalid input(s): POCBNP  Radiology/Studies  Dg Chest Portable 1 View  08/25/2015  CLINICAL DATA:  Acute onset of shortness of breath. Initial encounter. EXAM: PORTABLE CHEST 1 VIEW COMPARISON:  Chest radiograph performed 08/18/2015 FINDINGS: The lungs are well-aerated and clear. There is no evidence of focal opacification, pleural effusion or pneumothorax. Bilateral nipple shadows are seen. The cardiomediastinal silhouette is within normal limits. No acute osseous abnormalities are seen. IMPRESSION: No acute cardiopulmonary process seen. Electronically Signed   By: Garald Balding M.D.   On: 08/25/2015 21:16   Echocardiogram - 08/22/2015 - Left ventricle: The cavity size was normal. Wall thickness was normal. Systolic function was normal. The estimated ejection fraction was in the range of 55% to 60%. There is hypokinesis  of the midinferolateral and inferior myocardium. The study is not technically sufficient to allow evaluation of LV diastolic function. - Pulmonary arteries: Systolic pressure was mildly increased. PA peak pressure: 42 mm Hg (S).  ECG: Sinus tachycardia, mild 1 mm ST elevation in the anterior leads    ASSESSMENT AND PLAN  64 year old cachectic female with stage 4 COPD with frequent COPD exacerbation admissions   1. Chest pain - with some typical - pressure like persistent, but also pleuritic type of pain, however mild ST elevation in the anterior leads and minimal troponin elevation with flat trend 0.20 --> 0.22- possibly demand ischemia. We will schedule for a left cardiac cath since she has ongoing pain. Echo on 08/22/2015 showed normal LVEF. Continue ASA, heparin drip, if normal coronaries, a pulmonary embolism should be considered as well.  2. Persistent sinus tachycardia - avoid BB  with severe COPD, start cardizem CD 120 mg po daily.    Signed, Dorothy Spark, MD, [facility] 08/26/2015, 11:49 AM

## 2015-08-26 NOTE — Progress Notes (Signed)
ANTICOAGULATION CONSULT NOTE - Initial Consult  Pharmacy Consult for heparin Indication: chest pain/ACS  Allergies  Allergen Reactions  . Penicillins Rash    Has patient had a PCN reaction causing immediate rash, facial/tongue/throat swelling, SOB or lightheadedness with hypotension: {Yes Has patient had a PCN reaction causing severe rash involving mucus membranes or skin necrosis:NO Has patient had a PCN reaction that required hospitalization {Yes Has patient had a PCN reaction occurring within the last 10 years:NO If all of the above answers are "NO", then may proceed with Cephalosporin use.    Patient Measurements: Height: 5' 2.5" (158.8 cm) Weight: 95 lb 14.4 oz (43.5 kg) IBW/kg (Calculated) : 51.25  Vital Signs: Temp: 99.5 F (37.5 C) (10/25 0030) Temp Source: Oral (10/24 2105) BP: 139/81 mmHg (10/25 0030) Pulse Rate: 112 (10/25 0030)  Labs:  Recent Labs  08/25/15 2116 08/25/15 2124 08/26/15 2350  HGB 15.2* 16.7*  --   HCT 43.1 49.0*  --   PLT PLATELET CLUMPS NOTED ON SMEAR, COUNT APPEARS ADEQUATE  --   --   CREATININE 0.87 0.90  --   TROPONINI  --   --  0.22*    Estimated Creatinine Clearance: 44.5 mL/min (by C-G formula based on Cr of 0.9).   Medical History: Past Medical History  Diagnosis Date  . Myocardial infarction ([place])   . CHF (congestive heart failure) ([place])   . COPD (chronic obstructive pulmonary disease) ([place])   . Shortness of breath dyspnea   . Depression   . GERD (gastroesophageal reflux disease)   . History of hiatal hernia   . Headache   . Anemia   . Essential hypertension 08/19/2015  . Anxiety state 08/20/2015    Assessment: 64yo female discharged from hospital 3d ago, now c/o increased SOB w/ h/o lung ca and COPD, CXR shows no acute process, admitted for COPD exacerbation, troponin found to be elevated, to begin heparin.  Goal of Therapy:  Heparin level 0.3-0.7 units/ml Monitor platelets by anticoagulation protocol: Yes   Plan:   Will give heparin 2000 units IV bolus x1 followed by gtt at 500 units/hr and monitor heparin levels and CBC.  Wynona Neat, PharmD, BCPS  08/26/2015,1:11 AM

## 2015-08-26 NOTE — Progress Notes (Signed)
Patient MRSA PCR is positive.  Orders were made per protocol

## 2015-08-26 NOTE — Progress Notes (Addendum)
[place] for heparin Indication: chest pain/ACS  Allergies  Allergen Reactions  . Penicillins Rash    Has patient had a PCN reaction causing immediate rash, facial/tongue/throat swelling, SOB or lightheadedness with hypotension: {Yes Has patient had a PCN reaction causing severe rash involving mucus membranes or skin necrosis:NO Has patient had a PCN reaction that required hospitalization {Yes Has patient had a PCN reaction occurring within the last 10 years:NO If all of the above answers are "NO", then may proceed with Cephalosporin use.    Patient Measurements: Height: 5' 2.5" (158.8 cm) Weight: 91 lb (41.277 kg) IBW/kg (Calculated) : 51.25  Vital Signs: Temp: 98.3 F (36.8 C) (10/25 0844) Temp Source: Oral (10/25 0844) BP: 134/80 mmHg (10/25 0844) Pulse Rate: 103 (10/25 0245)  Labs:  Recent Labs  08/25/15 2116 08/25/15 2124 08/26/15 0653 08/26/15 1050 08/26/15 2350  HGB 15.2* 16.7*  --  13.7  --   HCT 43.1 49.0*  --  40.7  --   PLT PLATELET CLUMPS NOTED ON SMEAR, COUNT APPEARS ADEQUATE  --   --  642*  --   HEPARINUNFRC  --   --   --  0.10*  --   CREATININE 0.87 0.90 0.90  --   --   TROPONINI  --   --  0.20*  --  0.22*    Estimated Creatinine Clearance: 42.3 mL/min (by C-G formula based on Cr of 0.9).   Medical History: Past Medical History  Diagnosis Date  . Myocardial infarction ([place])   . CHF (congestive heart failure) ([place])   . COPD (chronic obstructive pulmonary disease) ([place])   . Shortness of breath dyspnea   . Depression   . GERD (gastroesophageal reflux disease)   . History of hiatal hernia   . Headache   . Anemia   . Essential hypertension 08/19/2015  . Anxiety state 08/20/2015    Assessment: 64yo female discharged from hospital 3d ago, now c/o increased SOB w/ h/o lung ca and COPD.  On heparin per pharmacy for elevated troponins.  First HL after 2000 unit bolus and 500 units/hr is low at 0.1.  No bleeding  reported. H/H WNL,  PLCT elevated.     Goal of Therapy:  Heparin level 0.3-0.7 units/ml Monitor platelets by anticoagulation protocol: Yes   Plan:  - rebolus with heparin 2000 units IV x1  - increase heparin drip to 650 units/hr and check 6 hr HL - daily HL and CBC while on heparin  Eudelia Bunch, Pharm.D. [phone] 08/26/2015 12:04 PM

## 2015-08-26 NOTE — Progress Notes (Signed)
Utilization Review Completed.  

## 2015-08-26 NOTE — Consult Note (Signed)
CARDIOLOGY CONSULT NOTE   Patient ID: Megan [name] MRN: [id number], DOB/AGE: 62-Nov-1952   Admit date: 08/25/2015 Date of Consult: 08/26/2015  Primary Physician: Pcp Not In System Primary Cardiologist: none  Reason for consult:  Elevated troponin  Problem List  Past Medical History  Diagnosis Date  . Myocardial infarction ([place])   . CHF (congestive heart failure) ([place])   . COPD (chronic obstructive pulmonary disease) ([place])   . Shortness of breath dyspnea   . Depression   . GERD (gastroesophageal reflux disease)   . History of hiatal hernia   . Headache   . Anemia   . Essential hypertension 08/19/2015  . Anxiety state 08/20/2015    Past Surgical History  Procedure Laterality Date  . Cardiac catheterization      Allergies  Allergies  Allergen Reactions  . Penicillins Rash    Has patient had a PCN reaction causing immediate rash, facial/tongue/throat swelling, SOB or lightheadedness with hypotension: {Yes Has patient had a PCN reaction causing severe rash involving mucus membranes or skin necrosis:NO Has patient had a PCN reaction that required hospitalization {Yes Has patient had a PCN reaction occurring within the last 10 years:NO If all of the above answers are "NO", then may proceed with Cephalosporin use.   HPI   64 year old woman with stage 4D COPD, cachectic appearing, recently discharged for COPD exacerbation developed progressive shortness of breath and chest tightness over the last 36 hours leading to readmission. She reports that she was able to obtain all of her usual medications at discharge except the [place], but still was taking prednisone. Yesterday she began to have worsening shortness of breath at rest, could not get out of bed. Also noted chest tightness and mild pain in her epigastrium recently. No new sputum production or fevers. No leg swelling.  The patient states that she has a prior history of MI but doesn't remember any details. There are no  records in our system. Currently she continues to have retrosternal chest tightness/pressure and also sharp pain on deep inspiration.  Cardiology was called for concern of new ST elevation in the anterior leads and mild troponin elevation.   Inpatient Medications  . aspirin  325 mg Oral Daily  . busPIRone  7.5 mg Oral BID  . Chlorhexidine Gluconate Cloth  6 each Topical Q0600  . escitalopram  20 mg Oral Daily  . feeding supplement (ENSURE ENLIVE)  237 mL Oral BID BM  . ipratropium-albuterol  3 mL Nebulization Q4H  . methylPREDNISolone (SOLU-MEDROL) injection  60 mg Intravenous 3 times per day  . mometasone-formoterol  2 puff Inhalation BID  . mupirocin ointment  1 application Nasal BID   Family History History reviewed. No pertinent family history.   Social History Social History   Social History  . Marital Status: Widowed    Spouse Name: N/A  . Number of Children: N/A  . Years of Education: N/A   Occupational History  . Not on file.   Social History Main Topics  . Smoking status: Former Smoker    Quit date: 08/18/2000  . Smokeless tobacco: Never Used  . Alcohol Use: No  . Drug Use: No  . Sexual Activity: No   Other Topics Concern  . Not on file   Social History Narrative    Review of Systems  General:  No chills, fever, night sweats or weight changes.  Cardiovascular:  No chest pain, dyspnea on exertion, edema, orthopnea, palpitations, paroxysmal nocturnal dyspnea. Dermatological: No rash, lesions/masses  Respiratory: No cough, dyspnea Urologic: No hematuria, dysuria Abdominal:   No nausea, vomiting, diarrhea, bright red blood per rectum, melena, or hematemesis Neurologic:  No visual changes, wkns, changes in mental status. All other systems reviewed and are otherwise negative except as noted above.  Physical Exam  Blood pressure 134/80, pulse 103, temperature 98.3 F (36.8 C), temperature source Oral, resp. rate 34, height 5' 2.5" (1.588 m), weight 91 lb  (41.277 kg), SpO2 94 %.  General: Pleasant, NAD Psych: Normal affect. Neuro: Alert and oriented X 3. Moves all extremities spontaneously. HEENT: Normal  Neck: Supple without bruits or JVD. Lungs:  Resp regular and unlabored, CTA. Heart: RRR no s3, s4, or murmurs. Abdomen: Soft, non-tender, non-distended, BS + x 4.  Extremities: No clubbing, cyanosis or edema. DP/PT/Radials 2+ and equal bilaterally.  Labs  Recent Labs  08/26/15 0653 08/26/15 2350  TROPONINI 0.20* 0.22*   Lab Results  Component Value Date   WBC 6.9 08/26/2015   HGB 13.7 08/26/2015   HCT 40.7 08/26/2015   MCV 94.0 08/26/2015   PLT 642* 08/26/2015    Recent Labs Lab 08/25/15 2116  08/26/15 0653  NA 129*  < > 129*  K 4.9  < > 4.5  CL 86*  < > 86*  CO2 26  --  31  BUN 20  < > 28*  CREATININE 0.87  < > 0.90  CALCIUM 9.1  --  8.8*  PROT 7.1  --   --   BILITOT 0.9  --   --   ALKPHOS 67  --   --   ALT 21  --   --   AST 31  --   --   GLUCOSE 110*  < > 139*  < > = values in this interval not displayed. No results found for: CHOL, HDL, LDLCALC, TRIG No results found for: DDIMER Invalid input(s): POCBNP  Radiology/Studies  Dg Chest Portable 1 View  08/25/2015  CLINICAL DATA:  Acute onset of shortness of breath. Initial encounter. EXAM: PORTABLE CHEST 1 VIEW COMPARISON:  Chest radiograph performed 08/18/2015 FINDINGS: The lungs are well-aerated and clear. There is no evidence of focal opacification, pleural effusion or pneumothorax. Bilateral nipple shadows are seen. The cardiomediastinal silhouette is within normal limits. No acute osseous abnormalities are seen. IMPRESSION: No acute cardiopulmonary process seen. Electronically Signed   By: Garald Balding M.D.   On: 08/25/2015 21:16   Echocardiogram - 08/22/2015 - Left ventricle: The cavity size was normal. Wall thickness was normal. Systolic function was normal. The estimated ejection fraction was in the range of 55% to 60%. There is hypokinesis  of the midinferolateral and inferior myocardium. The study is not technically sufficient to allow evaluation of LV diastolic function. - Pulmonary arteries: Systolic pressure was mildly increased. PA peak pressure: 42 mm Hg (S).  ECG: Sinus tachycardia, mild 1 mm ST elevation in the anterior leads    ASSESSMENT AND PLAN  64 year old cachectic female with stage 4 COPD with frequent COPD exacerbation admissions   1. Chest pain - with some typical - pressure like persistent, but also pleuritic type of pain, however mild ST elevation in the anterior leads and minimal troponin elevation with flat trend 0.20 --> 0.22- possibly demand ischemia. We will schedule for a left cardiac cath since she has ongoing pain. Echo on 08/22/2015 showed normal LVEF. Continue ASA, heparin drip, if normal coronaries, a pulmonary embolism should be considered as well.  2. Persistent sinus tachycardia - avoid BB  with severe COPD, start cardizem CD 120 mg po daily.    Signed, Dorothy Spark, MD, [facility] 08/26/2015, 11:49 AM

## 2015-08-26 NOTE — Consult Note (Signed)
PULMONARY / CRITICAL CARE MEDICINE   Name: Megan [name] MRN: [id number] DOB: [birth year]    ADMISSION DATE:  08/25/2015 CONSULTATION DATE:  08/25/2015  REFERRING MD :  EDP  CHIEF COMPLAINT:  SOB  INITIAL PRESENTATION: 64 year old female with known history and recent admission for COPD exacerbation.is discharged 3 days ago she was discharged 3 days ago on prednisone. Has been short of breath since that time and presented again on 10/24 complaining of shortness of breath. Blood gas in ED showed profound respiratory acidosis requiring BiPAP. P CCM asked to see.  STUDIES:  CXR admission > hyperinflation, flattened diaphragm. 6/20 CT chest [facility])  > There are scattered bilateral irregular nodular opacities which are unchanged in size compared to prior examination. For example a partially solid and partially groundglass nodule in the left lower lobe measures up to 1.7 cm, previously 1.7 cm (series 2, image 49). A nodule in the right lower lobe (series 2, image 32) measures 6 mm, previous is 6 mm. Centrilobular emphysema. F/u CT recommended in 12 months.  3/2 PFT([facility]) > FEV1/FCV pre 33.6, post 34.48%. DLCO 56.5%   SIGNIFICANT EVENTS: 10/17 - 10/21 > admit for COPD exacerbation.   SUBJECTIVE: awake, coughing, no distress  VITAL SIGNS: Temp:  [97.6 F (36.4 C)-99.7 F (37.6 C)] 98.3 F (36.8 C) (10/25 0844) Pulse Rate:  [94-142] 103 (10/25 0245) Resp:  [15-34] 34 (10/25 0316) BP: (93-245)/(58-196) 134/80 mmHg (10/25 0844) SpO2:  [93 %-100 %] 94 % (10/25 0832) FiO2 (%):  [30 %-40 %] 30 % (10/25 0832) Weight:  [41.051 kg (90 lb 8 oz)-43.5 kg (95 lb 14.4 oz)] 41.277 kg (91 lb) (10/25 0730) HEMODYNAMICS:   VENTILATOR SETTINGS: Vent Mode:  [-] PCV FiO2 (%):  [30 %-40 %] 30 % Set Rate:  [15 bmp] 15 bmp PEEP:  [5 cmH20-7 cmH20] 5 cmH20 INTAKE / OUTPUT:  Intake/Output Summary (Last 24 hours) at 08/26/15 1040 Last data filed at 08/26/15 0900  Gross per 24 hour  Intake    505 ml  Output     300 ml  Net    205 ml    PHYSICAL EXAMINATION: General:  Female appears older than stated age, cachectic, int coughing Neuro: Alert, oriented, non-focal HEENT:   [place]/AT, no JVD, PERRL Cardiovascular:  Tachy, regular, no MRG. No edema Lungs:  Wheezing is improved, reduced entry Abdomen:  Soft, non-tender, non-distended Musculoskeletal:  No acute deformity or ROM limitation Skin:  Grossly intact  LABS:  CBC  Recent Labs Lab 08/20/15 0411 08/25/15 2116 08/25/15 2124  WBC 8.1 9.9  --   HGB 12.5 15.2* 16.7*  HCT 37.5 43.1 49.0*  PLT 326 PLATELET CLUMPS NOTED ON SMEAR, COUNT APPEARS ADEQUATE  --    Coag's No results for input(s): APTT, INR in the last 168 hours. BMET  Recent Labs Lab 08/22/15 1010 08/25/15 2116 08/25/15 2124 08/26/15 0653  NA 138 129* 129* 129*  K 4.7 4.9 4.7 4.5  CL 89* 86* 89* 86*  CO2 35* 26  --  31  BUN 14 20 28* 28*  CREATININE 0.72 0.87 0.90 0.90  GLUCOSE 93 110* 112* 139*   Electrolytes  Recent Labs Lab 08/22/15 1010 08/25/15 2116 08/26/15 0653  CALCIUM 10.1 9.1 8.8*   Sepsis Markers No results for input(s): LATICACIDVEN, PROCALCITON, O2SATVEN in the last 168 hours. ABG  Recent Labs Lab 08/25/15 2128 08/25/15 2322  PHART 7.218* 7.315*  PCO2ART 86.2* 64.7*  PO2ART 100.0 74.0*   Liver Enzymes  Recent Labs Lab 08/22/15 0330  08/25/15 2116  AST 27 31  ALT 18 21  ALKPHOS 71 67  BILITOT 0.8 0.9  ALBUMIN 3.5 4.1   Cardiac Enzymes  Recent Labs Lab 08/26/15 0653 08/26/15 2350  TROPONINI 0.20* 0.22*   Glucose No results for input(s): GLUCAP in the last 168 hours.  Imaging Dg Chest Portable 1 View  08/25/2015  CLINICAL DATA:  Acute onset of shortness of breath. Initial encounter. EXAM: PORTABLE CHEST 1 VIEW COMPARISON:  Chest radiograph performed 08/18/2015 FINDINGS: The lungs are well-aerated and clear. There is no evidence of focal opacification, pleural effusion or pneumothorax. Bilateral nipple shadows are seen.  The cardiomediastinal silhouette is within normal limits. No acute osseous abnormalities are seen. IMPRESSION: No acute cardiopulmonary process seen. Electronically Signed   By: Garald Balding M.D.   On: 08/25/2015 21:16     ASSESSMENT / PLAN:  Acute on chronic hypercarbic respiratory failure COPD with acute exacerbation (on home O2) Multiple lung nodules described on recent admission -Improved clinically -BIPAP to prn, dc scheduled -continued IV steroids -BDers -dc oral pred with above IV -mobilize to some degree  Lavon Paganini. Titus Mould, MD, [place] Pgr: [facility]

## 2015-08-27 ENCOUNTER — Encounter (HOSPITAL_COMMUNITY): Payer: Self-pay | Admitting: Interventional Cardiology

## 2015-08-27 ENCOUNTER — Inpatient Hospital Stay (HOSPITAL_COMMUNITY): Payer: Medicare Other

## 2015-08-27 DIAGNOSIS — Z515 Encounter for palliative care: Secondary | ICD-10-CM

## 2015-08-27 LAB — BASIC METABOLIC PANEL
Anion gap: 8 (ref 5–15)
BUN: 21 mg/dL — AB (ref 6–20)
CO2: 34 mmol/L — AB (ref 22–32)
Calcium: 8.5 mg/dL — ABNORMAL LOW (ref 8.9–10.3)
Chloride: 88 mmol/L — ABNORMAL LOW (ref 101–111)
Creatinine, Ser: 0.67 mg/dL (ref 0.44–1.00)
GFR calc Af Amer: >60 mL/min (ref >60)
Glucose, Bld: 125 mg/dL — ABNORMAL HIGH (ref 65–99)
POTASSIUM: 4.5 mmol/L (ref 3.5–5.1)
Sodium: 130 mmol/L — ABNORMAL LOW (ref 135–145)

## 2015-08-27 LAB — HEPARIN LEVEL (UNFRACTIONATED)
HEPARIN UNFRACTIONATED: 0.25 [IU]/mL — AB (ref 0.30–0.70)
HEPARIN UNFRACTIONATED: 0.27 [IU]/mL — AB (ref 0.30–0.70)
Heparin Unfractionated: 0.22 IU/mL — ABNORMAL LOW (ref 0.30–0.70)

## 2015-08-27 LAB — URINE CULTURE: CULTURE: NO GROWTH

## 2015-08-27 LAB — CBC
HCT: 38 % (ref 36.0–46.0)
HEMOGLOBIN: 12.6 g/dL (ref 12.0–15.0)
MCH: 31.7 pg (ref 26.0–34.0)
MCHC: 33.2 g/dL (ref 30.0–36.0)
MCV: 95.7 fL (ref 78.0–100.0)
PLATELETS: 507 10*3/uL — AB (ref 150–400)
RBC: 3.97 MIL/uL (ref 3.87–5.11)
RDW: 11.3 % — ABNORMAL LOW (ref 11.5–15.5)
WBC: 11.2 10*3/uL — ABNORMAL HIGH (ref 4.0–10.5)

## 2015-08-27 MED ORDER — IOHEXOL 350 MG/ML SOLN
80.0000 mL | Freq: Once | INTRAVENOUS | Status: AC | PRN
  Administered 2015-08-27: 80 mL via INTRAVENOUS

## 2015-08-27 MED ORDER — ALPRAZOLAM 0.25 MG PO TABS
0.2500 mg | ORAL_TABLET | Freq: Once | ORAL | Status: AC
  Administered 2015-08-27: 0.25 mg via ORAL
  Filled 2015-08-27: qty 1

## 2015-08-27 MED ORDER — ASPIRIN 81 MG PO CHEW: 81.0000 mg | CHEWABLE_TABLET | Freq: Every day | ORAL | Status: DC

## 2015-08-27 MED ORDER — ENSURE ENLIVE PO LIQD
237.0000 mL | Freq: Three times a day (TID) | ORAL | Status: DC
  Administered 2015-08-27 – 2015-08-29 (×4): 237 mL via ORAL
  Filled 2015-08-27 (×8): qty 237

## 2015-08-27 MED ORDER — SODIUM CHLORIDE 0.9 % IV BOLUS (SEPSIS): 1000.0000 mL | Freq: Once | INTRAVENOUS | Status: DC

## 2015-08-27 MED ORDER — HEPARIN (PORCINE) IN NACL 100-0.45 UNIT/ML-% IJ SOLN: 800.0000 [IU]/h | INTRAMUSCULAR | Status: DC

## 2015-08-27 MED ORDER — PREDNISONE 20 MG PO TABS
40.0000 mg | ORAL_TABLET | Freq: Every day | ORAL | Status: DC
  Administered 2015-08-27 – 2015-08-29 (×3): 40 mg via ORAL
  Filled 2015-08-27 (×3): qty 2

## 2015-08-27 MED ORDER — ENOXAPARIN SODIUM 30 MG/0.3ML ~~LOC~~ SOLN
20.0000 mg | SUBCUTANEOUS | Status: DC
  Administered 2015-08-28 – 2015-08-29 (×2): 20 mg via SUBCUTANEOUS
  Filled 2015-08-27 (×2): qty 0.2
  Filled 2015-08-27: qty 0.3

## 2015-08-27 NOTE — Progress Notes (Signed)
Placed patient on BIPAP 10/5 with 3L 02 bleed in. Patient is tolerating well at this time.

## 2015-08-27 NOTE — Progress Notes (Addendum)
Patient Profile: 64 year old female with stage 4D COPD, recently discharged for COPD exacerbation who was readmitted for chest pain and worsening dyspnea with an elevated troponin, 0.20, 0.15. LHC showed widely patent coronaries and normal LVEF of 65%.   Subjective: Still with mild dyspnea and productive cough with yellow-green sputum.   Objective: Vital signs in last 24 hours: Temp:  [97.8 F (36.6 C)-98.3 F (36.8 C)] 98 F (36.7 C) (10/26 0511) Pulse Rate:  [0-133] 87 (10/26 0534) Resp:  [0-30] 13 (10/26 0534) BP: (114-179)/(59-106) 115/74 mmHg (10/26 0511) SpO2:  [0 %-100 %] 98 % (10/26 0534) FiO2 (%):  [30 %] 30 % (10/25 0832) Weight:  [83 lb 12.4 oz (38 kg)-84 lb 3.2 oz (38.193 kg)] 83 lb 12.4 oz (38 kg) (10/26 0511) Last BM Date: 08/25/15  Intake/Output from previous day: 10/25 0701 - 10/26 0700 In: 1307.7 [P.O.:840; I.V.:467.7] Out: 1600 [Urine:1600] Intake/Output this shift:    Medications Current Facility-Administered Medications  Medication Dose Route Frequency Provider Last Rate Last Dose  . 0.9 %  sodium chloride infusion  250 mL Intravenous PRN Jettie Booze, MD      . acetaminophen (TYLENOL) tablet 650 mg  650 mg Oral Q4H PRN Jettie Booze, MD      . albuterol (PROVENTIL) (2.5 MG/3ML) 0.083% nebulizer solution 2.5 mg  2.5 mg Nebulization Q2H PRN Jones Bales, MD      . ALPRAZolam Duanne Moron) tablet 0.25 mg  0.25 mg Oral BID PRN Iline Oven, MD   0.25 mg at 08/27/15 0655  . antiseptic oral rinse (CPC / CETYLPYRIDINIUM CHLORIDE 0.05%) solution 7 mL  7 mL Mouth Rinse BID Axel Filler, MD   7 mL at 08/26/15 1400  . aspirin tablet 325 mg  325 mg Oral Daily Iline Oven, MD   325 mg at 08/26/15 1007  . busPIRone (BUSPAR) tablet 7.5 mg  7.5 mg Oral BID Iline Oven, MD      . butalbital-acetaminophen-caffeine [facility], [facility]) [phone] MG per tablet 1 tablet  1 tablet Oral BID PRN Iline Oven, MD   1 tablet at 08/26/15  1543  . Chlorhexidine Gluconate Cloth 2 % PADS 6 each  6 each Topical Q0600 Axel Filler, MD   6 each at 08/27/15 [phone]  . escitalopram (LEXAPRO) tablet 20 mg  20 mg Oral Daily Iline Oven, MD   20 mg at 08/26/15 1231  . feeding supplement (ENSURE ENLIVE) (ENSURE ENLIVE) liquid 237 mL  237 mL Oral BID BM Iline Oven, MD   237 mL at 08/26/15 1145  . heparin ADULT infusion 100 units/mL (25000 units/250 mL)  650 Units/hr Intravenous Continuous Reginia Naas, RPH 6.5 mL/hr at 08/26/15 2102 650 Units/hr at 08/26/15 2102  . ipratropium-albuterol (DUONEB) 0.5-2.5 (3) MG/3ML nebulizer solution 3 mL  3 mL Nebulization QID Axel Filler, MD      . ketorolac (TORADOL) 15 MG/ML injection 15 mg  15 mg Intravenous Q8H PRN Iline Oven, MD   15 mg at 08/26/15 1049  . mometasone-formoterol (DULERA) 200-5 MCG/ACT inhaler 2 puff  2 puff Inhalation BID Norval Gable, MD   2 puff at 08/26/15 2335  . mupirocin ointment (BACTROBAN) 2 % 1 application  1 application Nasal BID Axel Filler, MD   1 application at 13/08/65 2107  . ondansetron (ZOFRAN) injection 4 mg  4 mg Intravenous Q6H PRN Jettie Booze, MD      . sodium  chloride 0.9 % injection 3 mL  3 mL Intravenous Q12H Jettie Booze, MD   3 mL at 08/26/15 2227  . sodium chloride 0.9 % injection 3 mL  3 mL Intravenous PRN Jettie Booze, MD        PE: General appearance: alert, cooperative, no distress and frail appearing Neck: no carotid bruit and no JVD Lungs: decreased BS bilaterally with diffuse rhonci L>R Heart: regular rate and rhythm, S1, S2 normal, no murmur, click, rub or gallop Extremities: no LEE Pulses: 2+ and symmetric  Lab Results:   Recent Labs  08/25/15 2116 08/25/15 2124 08/26/15 1050 08/27/15 0130  WBC 9.9  --  6.9 11.2*  HGB 15.2* 16.7* 13.7 12.6  HCT 43.1 49.0* 40.7 38.0  PLT PLATELET CLUMPS NOTED ON SMEAR, COUNT APPEARS ADEQUATE  --  642* 507*   BMET  Recent  Labs  08/25/15 2116 08/25/15 2124 08/26/15 0653  NA 129* 129* 129*  K 4.9 4.7 4.5  CL 86* 89* 86*  CO2 26  --  31  GLUCOSE 110* 112* 139*  BUN 20 28* 28*  CREATININE 0.87 0.90 0.90  CALCIUM 9.1  --  8.8*   PT/INR  Recent Labs  08/26/15 1505  LABPROT 14.6  INR 1.12   Cardiac Panel (last 3 results)  Recent Labs  08/26/15 0653 08/26/15 1050 08/26/15 2350  TROPONINI 0.20* 0.15* 0.22*    Studies/Results: LHC 08/26/15  Conclusion     There is hyperdynamic left ventricular systolic function.  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 No significant CAD.  No ischemic heart disease.    Left Heart    Left Ventricle The left ventricular size is normal. There is hyperdynamic left ventricular systolic function. The left ventricular ejection fraction is greater tha 65% by visual estimate. There are no wall motion abnormalities in the left ventricle.   Aortic Valve There is no aortic valve stenosis.     Assessment/Plan  Active Problems:   Essential hypertension   Protein-calorie malnutrition, severe   Chronic post-traumatic stress disorder (PTSD)   Acute exacerbation of chronic obstructive pulmonary disease (COPD) (HCC)   COPD with acute exacerbation (HCC)   Elevated troponin  1. Chest Pain with Abnormal Troponin: low level trend, 0.20, 0.15, 0.22. LHC 08/26/15 showed patent coronary arteries with normal LVEF of 55-60%. No ischemic heart disease. No further cardiac w/u.   2. COPD: management per IM.   3. Post Cath:  BMP pending to assess SCr post cath. Hgb stable w/o any anemia. Right radial cath site is stable witih 2+ radial pulse.     LOS: 2 days   Brittainy M. Ladoris Gene 08/27/2015 7:32 AM   The patient was seen, examined and discussed with Brittainy M. Rosita Fire, PA-C and I agree with the above.   64 year old female with stage 4D COPD, recently discharged for COPD exacerbation who was readmitted for chest pain and worsening dyspnea with an elevated  troponin, 0.20, 0.15. LHC showed widely patent coronaries and normal LVEF of 65%. We are not planning any further cardiac workup, the patient can be discharged from cardiac stand point. She continues to have mild oozing from cath insertion radial side on the right. Good pulses distal to access site. She continues to have productive sputum. Management of bronchitis and COPD per primary team.   Dorothy Spark 08/27/2015

## 2015-08-27 NOTE — Progress Notes (Signed)
ANTICOAGULATION CONSULT NOTE - Follow Up Consult  Pharmacy Consult for heparin Indication: r/o PE   Labs:  Recent Labs  08/25/15 2116 08/25/15 2124 08/26/15 0653 08/26/15 1050 08/26/15 1505 08/26/15 2350 08/27/15 0130  HGB 15.2* 16.7*  --  13.7  --   --  12.6  HCT 43.1 49.0*  --  40.7  --   --  38.0  PLT PLATELET CLUMPS NOTED ON SMEAR, COUNT APPEARS ADEQUATE  --   --  642*  --   --  507*  LABPROT  --   --   --   --  14.6  --   --   INR  --   --   --   --  1.12  --   --   HEPARINUNFRC  --   --   --  0.10*  --   --  0.25*  CREATININE 0.87 0.90 0.90  --   --   --   --   TROPONINI  --   --  0.20* 0.15*  --  0.22*  --      Assessment/Plan:  64yo female slightly subtherapeutic on heparin though lab was drawn just ~4hr after started without bolus and may need more time to accumulate. Will continue gtt at current rate and check additional level.   Wynona Neat, PharmD, BCPS  08/27/2015,2:37 AM

## 2015-08-27 NOTE — Progress Notes (Signed)
Initial Nutrition Assessment  DOCUMENTATION CODES:   Severe malnutrition in context of chronic illness, Underweight  INTERVENTION:  Ensure Enlive po BID, each supplement provides 350 kcal and 20 grams of protein  NUTRITION DIAGNOSIS:   Malnutrition related to chronic illness as evidenced by severe depletion of muscle mass, severe depletion of body fat.  GOAL:   Patient will meet greater than or equal to 90% of their needs  MONITOR:   PO intake, Supplement acceptance, I & O's, Labs, Weight trends  REASON FOR ASSESSMENT:   Consult Poor PO  ASSESSMENT:   Megan [name] is a 64 year old woman with a past medical history of COPD (Stage 4D), HTN, PTSD, lung nodules concerning for NSCLC, and anxiety who presents with worsening shortness of breath. She was recently admitted for the same problem (10/18 - 10), during which her COPD regimen was adjusted to full dose Advair, scheduled Incruse Ellipta, and daily Azithromycin.  Pt was recently discharged, seen by RD for low BMI. Review of note shows she had a good appetite at that point in time, with 100% meal completion.   Pt dropped 12#/12% of her body weight between admissions. When I spoke to her about it, she said she as unable to eat because of SOB. Struggled to consume any calories.  Pt has extensive social history issues as well. Came from [place] to visit daughter, became sick while here, was admitted, and will stay here in [place]. Also possible presence of NSCLC.  Ensure is currently ordered, changed to TID for patient as she is severely underweight.  Nutrition-Focused physical exam completed. Findings are severe fat depletion, severe muscle depletion, and no edema.     Diet Order:  Diet regular Room service appropriate?: Yes; Fluid consistency:: Thin  Skin:  Reviewed, no issues  Last BM:  08/25/2015  Height:   Ht Readings from Last 1 Encounters:  08/26/15 5' 2.5" (1.588 m)    Weight:   Wt Readings from Last 1  Encounters:  08/27/15 83 lb 12.4 oz (38 kg)    Ideal Body Weight:  51 kg  BMI:  Body mass index is 15.07 kg/(m^2).  Estimated Nutritional Needs:   Kcal:  1500 -1700 calories  Protein:  65 - 80 grams  Fluid:  >/= 1.5L  EDUCATION NEEDS:   No education needs identified at this time  Megan [name]. Megan Rasmussen, MS, RD LDN After Hours/Weekend Pager [phone]

## 2015-08-27 NOTE — Progress Notes (Signed)
Subjective: [name]  Patient reports improved breathing and cough. She tolerated BiPAP overnight with improved sleep.  She still complains of intermittent tingling/numbness of her arms and legs, usually relieved with Xanax.  She still has unreal expectations of what she can expect wrt return of cardiopulmonary function.   Objective: Vital signs in last 24 hours: Filed Vitals:   08/27/15 0249 08/27/15 0404 08/27/15 0511 08/27/15 0534  BP:   115/74   Pulse: 105 76 77 87  Temp:   98 F (36.7 C)   TempSrc:   Axillary   Resp: '18 20 18 13  '$ Height:      Weight:   83 lb 12.4 oz (38 kg)   SpO2: 92% 94% 95% 98%   Weight change: -4 lb 14.4 oz (-2.223 kg)  Intake/Output Summary (Last 24 hours) at 08/27/15 0804 Last data filed at 08/27/15 0554  Gross per 24 hour  Intake 1232.7 ml  Output   1600 ml  Net -367.3 ml   Physical Exam  Constitutional: She is oriented to person, place, and time.  Frail, appears older than stated age. Moderate respiratory distress.  HENT:  Head: Normocephalic and atraumatic.  Eyes: EOM are normal. No scleral icterus.  Neck: No JVD present. No tracheal deviation present.  Cardiovascular: Normal heart sounds and intact distal pulses.   Borderline tachycardic. Regular rhythm.  Pulmonary/Chest:  Decreased air movement throughout. No wheezes appreciated following duoneb. No respiratory distress.  Abdominal: Soft. She exhibits no distension. There is no tenderness. There is no rebound and no guarding.  Musculoskeletal: She exhibits no edema.  No calf swelling or tenderness.  Neurological: She is alert and oriented to person, place, and time.  Skin: Skin is warm and dry. No rash noted.    Lab Results: Basic Metabolic Panel:  Recent Labs Lab 08/25/15 2116 08/25/15 2124 08/26/15 0653  NA 129* 129* 129*  K 4.9 4.7 4.5  CL 86* 89* 86*  CO2 26  --  31  GLUCOSE 110* 112* 139*  BUN 20 28* 28*  CREATININE 0.87 0.90 0.90  CALCIUM 9.1  --  8.8*   Liver  Function Tests:  Recent Labs Lab 08/22/15 0330 08/25/15 2116  AST 27 31  ALT 18 21  ALKPHOS 71 67  BILITOT 0.8 0.9  PROT 6.8 7.1  ALBUMIN 3.5 4.1   No results for input(s): LIPASE, AMYLASE in the last 168 hours. No results for input(s): AMMONIA in the last 168 hours. CBC:  Recent Labs Lab 08/25/15 2116  08/26/15 1050 08/27/15 0130  WBC 9.9  --  6.9 11.2*  NEUTROABS 6.5  --   --   --   HGB 15.2*  < > 13.7 12.6  HCT 43.1  < > 40.7 38.0  MCV 93.1  --  94.0 95.7  PLT PLATELET CLUMPS NOTED ON SMEAR, COUNT APPEARS ADEQUATE  --  642* 507*  < > = values in this interval not displayed. Cardiac Enzymes:  Recent Labs Lab 08/26/15 0653 08/26/15 1050 08/26/15 2350  TROPONINI 0.20* 0.15* 0.22*   BNP: No results for input(s): PROBNP in the last 168 hours. D-Dimer: No results for input(s): DDIMER in the last 168 hours. CBG: No results for input(s): GLUCAP in the last 168 hours. Hemoglobin A1C: No results for input(s): HGBA1C in the last 168 hours. Fasting Lipid Panel: No results for input(s): CHOL, HDL, LDLCALC, TRIG, CHOLHDL, LDLDIRECT in the last 168 hours. Thyroid Function Tests: No results for input(s): TSH, T4TOTAL, FREET4, T3FREE, THYROIDAB in the  last 168 hours. Coagulation:  Recent Labs Lab 08/26/15 1505  LABPROT 14.6  INR 1.12   Anemia Panel: No results for input(s): VITAMINB12, FOLATE, FERRITIN, TIBC, IRON, RETICCTPCT in the last 168 hours. Urine Drug Screen: Drugs of Abuse  No results found for: LABOPIA, COCAINSCRNUR, LABBENZ, AMPHETMU, THCU, LABBARB  Alcohol Level: No results for input(s): ETH in the last 168 hours. Urinalysis:  Recent Labs Lab 08/25/15 2130  COLORURINE YELLOW  LABSPEC 1.019  PHURINE 6.0  GLUCOSEU NEGATIVE  HGBUR SMALL*  BILIRUBINUR NEGATIVE  KETONESUR 40*  PROTEINUR 30*  UROBILINOGEN 0.2  NITRITE NEGATIVE  LEUKOCYTESUR NEGATIVE   Misc. Labs:   Micro Results: Recent Results (from the past 240 hour(s))  Urine culture      Status: None (Preliminary result)   Collection Time: 08/25/15  9:30 PM  Result Value Ref Range Status   Specimen Description URINE, CATHETERIZED  Final   Special Requests NONE  Final   Culture NO GROWTH < 12 HOURS  Final   Report Status PENDING  Incomplete  MRSA PCR Screening     Status: Abnormal   Collection Time: 08/26/15  3:29 AM  Result Value Ref Range Status   MRSA by PCR POSITIVE (A) NEGATIVE Final    Comment:        The GeneXpert MRSA Assay (FDA approved for NASAL specimens only), is one component of a comprehensive MRSA colonization surveillance program. It is not intended to diagnose MRSA infection nor to guide or monitor treatment for MRSA infections. RESULT CALLED TO, READ BACK BY AND VERIFIED WITH: A PETTIFORD '@0530'$  08/26/15 MKELLY    Studies/Results: Dg Chest Portable 1 View  08/25/2015  CLINICAL DATA:  Acute onset of shortness of breath. Initial encounter. EXAM: PORTABLE CHEST 1 VIEW COMPARISON:  Chest radiograph performed 08/18/2015 FINDINGS: The lungs are well-aerated and clear. There is no evidence of focal opacification, pleural effusion or pneumothorax. Bilateral nipple shadows are seen. The cardiomediastinal silhouette is within normal limits. No acute osseous abnormalities are seen. IMPRESSION: No acute cardiopulmonary process seen. Electronically Signed   By: Garald Balding M.D.   On: 08/25/2015 21:16   Medications: I have reviewed the patient's current medications. Scheduled Meds: . antiseptic oral rinse  7 mL Mouth Rinse BID  . aspirin  325 mg Oral Daily  . busPIRone  7.5 mg Oral BID  . Chlorhexidine Gluconate Cloth  6 each Topical Q0600  . escitalopram  20 mg Oral Daily  . feeding supplement (ENSURE ENLIVE)  237 mL Oral BID BM  . ipratropium-albuterol  3 mL Nebulization QID  . mometasone-formoterol  2 puff Inhalation BID  . mupirocin ointment  1 application Nasal BID  . sodium chloride  3 mL Intravenous Q12H   Continuous Infusions: . heparin 650  Units/hr (08/26/15 2102)   PRN Meds:.sodium chloride, acetaminophen, albuterol, ALPRAZolam, butalbital-acetaminophen-caffeine, ketorolac, ondansetron (ZOFRAN) IV, sodium chloride Assessment/Plan: Principal Problem:   Elevated troponin Active Problems:   Essential hypertension   Protein-calorie malnutrition, severe   Chronic post-traumatic stress disorder (PTSD)   Acute exacerbation of chronic obstructive pulmonary disease (COPD) ([place])  Ms. Reagor is a 64 year old woman with a past medical history of COPD (Stage 4D), HTN, PTSD, lung nodules concerning for NSCLC, and anxiety who presents with worsening shortness of breath.  Acute on Chronic Respiratory Failure (COPD Stage 4D): Patient presents with worsening DOE and at rest.  She admits that running out of her Xanax made her dyspnea worse.  She has been taking her Prednisone, Azithromycin, Advair,  and Albuterol.  She had not picked up her Incruse. It is possible that not picking up her LAMA may have led to worsening SOB, but it is also possible that her overall respiratory status is continuing to decline, further worsened by her anxiety.  In addition, ECG findings and troponinemia concerning for acute cardiac cause of dyspena.  Cath 10/25 negative for ischemic disease.  Given patient's [place] PE (4-7; moderate-high risk), will r/o PE with CTA.  Will consult Palliative Care for [place] discussion.  Patient has unreal expectations for her functionality given her poor pulmonary status and deconditioning.  Will restart Azithromycin after CTA. - Stop Doxycycline 100 mg BID - Duoneb q4h - Prednisone 40 mg - Albuterol  - Dulera BID - BiPAP PRN '[ ]'$  CTA  ST Elevation and Elevated Troponins: After viewing EKG on 10/18, there appear to be new ST elevations. Patient had previously been in sinus tach without ST changes.  Elevated troponins could be related to demand ischemia vs ACS vs PE.  No signs of PE on ECG, but patient is sinus tach, low SpO2, recently  hospitalized, and relatively immobile. Cardiac cath negative for ischemic coronary disease. Will obtain CTA for PE r/o. 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 Concepcion Living from [facility] on 11/1  PTSD/Anxiety: chronic. Last evenings episode seems most consistent with anxiety attack during manipulation of IV. Attack alleviated with Xanax and Fioricet, both of which are GABAergic. Patient is on max dosing of Lexapro. Will add Buspar for added longer term benefit. - Escitalopram 20 mg daily - Xanax 0.25 mg BID PRN - Buspar 7.5 mg BID for added anxiety benefit - Ramelteon  Protein Malnutrition: Ensure  DVT Prophylaxis: Receiving Heparin  Code Status: Full  Dispo: Disposition is deferred at this time, awaiting improvement of current medical problems.  Anticipated discharge in approximately 2-3 day(s).   The patient does not have a current PCP (Pcp Not In System) and does not need an [facility] hospital follow-up appointment after discharge.  The patient does not have transportation limitations that hinder transportation to clinic appointments.  .Services Needed at time of discharge: Y = Yes, Blank = No PT:   OT:   RN:   Equipment:   Other:     LOS: 2 days   Iline Oven, MD 08/27/2015, 8:04 AM

## 2015-08-27 NOTE — Progress Notes (Signed)
ANTICOAGULATION CONSULT NOTE - FOLLOW UP    HL = 0.22 (goal 0.3 - 0.7 units/mL) Heparin dosing weight = 38 kg   Assessment: 14 YOF continues on IV heparin for PE.  Heparin level remains sub-therapeutic despite rate adjustments.  Heparin currently infusing at 700 units/hr although it is not charted.  No issue with heparin infusion and no bleeding reported.   Plan: - Increase heparin gtt to 800 units/hr - Check 6 hr HL    Megan [name] D. Mina Marble, PharmD, BCPS 08/27/2015, 8:55 PM

## 2015-08-27 NOTE — Progress Notes (Signed)
PT Cancellation Note  Patient Details Name: Megan [name] MRN: [id number] DOB: 07-31-1951   Cancelled Treatment:    Reason Eval/Treat Not Completed: Medical issues which prohibited therapy Cleared by cardiology however noted concern for pulmonary embolism with discussion of CTA to be ordered. Heparin level subtherapeutic at this time. Per departmental guidelines well hold comprehensive PT evaluation at this time. Will check back tomorrow.  Ellouise Newer 08/27/2015, 12:18 PM Camille Bal [place], [place]

## 2015-08-27 NOTE — Progress Notes (Signed)
OT Cancellation Note  Patient Details Name: Megan [name] MRN: [id number] DOB: [birth year]   Cancelled Treatment:    Reason Eval/Treat Not Completed: Other (comment) Troponins trending up. HL subtherapeutic. Will await clearance from MD inorder to proceed with eval. Thanks Sidney, OTR/L  [phone] 08/27/2015 08/27/2015, 9:30 AM

## 2015-08-27 NOTE — Progress Notes (Signed)
Increase BIPAP to 12/6 with 6L 02 bleed in due to patient desaturations into the mid 80's.  Sat increased to 93%. Patient is tolerating well.

## 2015-08-27 NOTE — Progress Notes (Addendum)
Went to give patient breathing treatment. Patient is eating at this time and wished for RT to come back. Stated she would call when done as it may take her awhile. RT will attempted again at a later time.

## 2015-08-27 NOTE — Progress Notes (Signed)
Patient asked to be taken off BIPAP for a rest. Placed patient on 2L [place].

## 2015-08-27 NOTE — Progress Notes (Signed)
ANTICOAGULATION CONSULT NOTE - Initial Consult  Pharmacy Consult for heparin  Indication: PE  Allergies  Allergen Reactions  . Penicillins Rash    Has patient had a PCN reaction causing immediate rash, facial/tongue/throat swelling, SOB or lightheadedness with hypotension: {Yes Has patient had a PCN reaction causing severe rash involving mucus membranes or skin necrosis:NO Has patient had a PCN reaction that required hospitalization {Yes Has patient had a PCN reaction occurring within the last 10 years:NO If all of the above answers are "NO", then may proceed with Cephalosporin use.    Patient Measurements: Height: 5' 2.5" (158.8 cm) Weight: 83 lb 12.4 oz (38 kg) IBW/kg (Calculated) : 51.25  Vital Signs: Temp: 98 F (36.7 C) (10/26 0511) Temp Source: Axillary (10/26 0511) BP: 152/67 mmHg (10/26 0816) Pulse Rate: 82 (10/26 0816)  Labs:  Recent Labs  08/25/15 2116 08/25/15 2124 08/26/15 0653 08/26/15 1050 08/26/15 1505 08/26/15 2350 08/27/15 0130 08/27/15 0803  HGB 15.2* 16.7*  --  13.7  --   --  12.6  --   HCT 43.1 49.0*  --  40.7  --   --  38.0  --   PLT PLATELET CLUMPS NOTED ON SMEAR, COUNT APPEARS ADEQUATE  --   --  642*  --   --  507*  --   LABPROT  --   --   --   --  14.6  --   --   --   INR  --   --   --   --  1.12  --   --   --   HEPARINUNFRC  --   --   --  0.10*  --   --  0.25* 0.27*  CREATININE 0.87 0.90 0.90  --   --   --   --  0.67  TROPONINI  --   --  0.20* 0.15*  --  0.22*  --   --     Estimated Creatinine Clearance: 43.7 mL/min (by C-G formula based on Cr of 0.67).   Medical History: Past Medical History  Diagnosis Date  . Myocardial infarction ([place])   . CHF (congestive heart failure) ([place])   . COPD (chronic obstructive pulmonary disease) ([place])   . Shortness of breath dyspnea   . Depression   . GERD (gastroesophageal reflux disease)   . History of hiatal hernia   . Headache   . Anemia   . Essential hypertension 08/19/2015  . Anxiety state  08/20/2015    Assessment: 64yo female discharged from hospital 3d ago, now c/o increased SOB w/ h/o lung ca and COPD. On heparin per pharmacy for elevated troponins and stopped after cath. Now to restart heparin for PE. Hgb stable, plts elevated at 507. No s/s of bleed.  HL is subtherapeutic at 0.27.  Goal of Therapy:  Heparin level 0.3-0.7 units/ml Monitor platelets by anticoagulation protocol: Yes   Plan:  Increase heparin gtt to 700 units/hr Check 6 hr HL Monitor daily HL, CBC, s/s of bleed  Elenor Quinones, PharmD Clinical Pharmacist Pager [phone] 08/27/2015 8:39 AM

## 2015-08-27 NOTE — Consult Note (Addendum)
Consultation Note Date: 08/27/2015   Patient Name: Megan [name]  DOB: [birth year]  MRN: [id number]  Age / Sex: 64 y.o., female   PCP: Pcp Not In System Referring Physician: Axel Filler, MD  Reason for Consultation: Establishing goals of care  Palliative Care Assessment and Plan Summary of Established Goals of Care and Medical Treatment Preferences   Clinical Assessment/Narrative: Megan [name] is a 64 year old female with past medical history of stage IV D COPD, hypertension, PTSD, lung nodules concerning for NSCLC, and anxiety admitted with worsening shortness of breath. Palliative was consulted for goals of care in light of acute worsening of her chronic progressive lung disease.  I met with Megan [name] and Dr. Heber Kingston.   She reports the doctors been doing a good job explaining things to her, and she reports that she continues to have episodes of worsening of her COPD. She reports that her goal is to "get back to normal." When questioned further, however, she is able to verbalize that she has a chronic lung disease that is not going to be cured. She also reports understanding that she has lung nodules that are concerning for possibility of lung cancer.  She finds it very difficult to cope with this fact due to the fact that she quit smoking a long time ago and is reports trying to do everything she can to improve her health. She is currently here visiting her daughter and is originally from [place]. Her plan is to eventually move the area. She reports she has been seen in multiple hospitals recently including here, [facility], and [facility].  She is open to discussion when talking about any improvement in her condition, but withdraws immediately whenever trying to discuss anything other than the fact that she is going to recover to her prior baseline. Goal for today was largely to begin to build rapport.  Contacts/Participants in Discussion: Primary Decision Maker: the patient HCPOA:  None on chart   Code Status/Advance Care Planning:  Full code  Symptom Management:   Anxiety: Patient currently on Lexapro as well as Xanax twice daily. She reports that this works well when she takes it, but does not provide relief all day. Could consider transition to longer acting benzodiazepine.  Dyspnea: She reports she is consistently short of breath. We talked about use of fan in order to stimulate trigeminal nerve as being beneficial in treatment of dyspnea. She reports normally she has improvement in her breathing after initiation of steroids. She may also benefit from low-dose opioid therapy in order to help with her sensation of acute dyspnea. This would need to be done judiciously, however, due to the fact she is also taking benzodiazepines.    Additional Recommendations (Limitations, Scope, Preferences):  Patient reports that she has been intubated, "4-5 times and I have always bounced back." She does not appear to be at a point where she be willing to further discuss any limitations on her care at this point in time. We talked about the fact that she has a chronic illness the will continue to worsen, and also the fact that she has lung nodules of unknown etiology. We also discussed that the hospital can be useful as long as she is getting well enough from care she receives at the hospital to enjoy time at home, but there is going to come a time due to her progressive disease where the hospital may not serve her well with this purpose.  Overall, she remained very pleasant during conversation, but  withdrew anytime attempted to discuss anything other than her complete recovery. She is able to verbalize that she has a chronic condition that is going to continue to worsen. We will continue to follow up with her this admission and advanced goals of care conversation as she is emotionally able   Psycho-social/Spiritual:   Support System: Her daughter  Desire for further Chaplaincy  support: No  Prognosis: Unable to determine due to acute illness  Discharge Planning:  Home       Chief Complaint/History of Present Illness:  64 year old female with acute exacerbation of chronic obstructive pulmonary disease  Primary Diagnoses  Present on Admission:  . Acute exacerbation of chronic obstructive pulmonary disease (COPD) ([place]) . Chronic post-traumatic stress disorder (PTSD) . Protein-calorie malnutrition, severe . Essential hypertension . Elevated troponin  Palliative Review of Systems: Endorses dyspnea and anxiety I have reviewed the medical record, interviewed the patient and family, and examined the patient. The following aspects are pertinent.  Past Medical History  Diagnosis Date  . Myocardial infarction ([place])   . CHF (congestive heart failure) ([place])   . COPD (chronic obstructive pulmonary disease) ([place])   . Shortness of breath dyspnea   . Depression   . GERD (gastroesophageal reflux disease)   . History of hiatal hernia   . Headache   . Anemia   . Essential hypertension 08/19/2015  . Anxiety state 08/20/2015   Social History   Social History  . Marital Status: Widowed    Spouse Name: N/A  . Number of Children: N/A  . Years of Education: N/A   Social History Main Topics  . Smoking status: Former Smoker    Quit date: 08/18/2000  . Smokeless tobacco: Never Used  . Alcohol Use: No  . Drug Use: No  . Sexual Activity: No   Other Topics Concern  . None   Social History Narrative   History reviewed. No pertinent family history. Scheduled Meds: . ALPRAZolam  0.25 mg Oral Once  . antiseptic oral rinse  7 mL Mouth Rinse BID  . busPIRone  7.5 mg Oral BID  . Chlorhexidine Gluconate Cloth  6 each Topical Q0600  . escitalopram  20 mg Oral Daily  . feeding supplement (ENSURE ENLIVE)  237 mL Oral TID BM  . ipratropium-albuterol  3 mL Nebulization QID  . mometasone-formoterol  2 puff Inhalation BID  . mupirocin ointment  1 application Nasal  BID  . predniSONE  40 mg Oral Q breakfast  . sodium chloride  1,000 mL Intravenous Once  . sodium chloride  3 mL Intravenous Q12H   Continuous Infusions: . heparin 650 Units/hr (08/26/15 2102)   PRN Meds:.sodium chloride, acetaminophen, albuterol, ALPRAZolam, butalbital-acetaminophen-caffeine, ondansetron (ZOFRAN) IV, sodium chloride Medications Prior to Admission:  Prior to Admission medications   Medication Sig Start Date End Date Taking? Authorizing Provider  acetaminophen (TYLENOL) 500 MG tablet Take 500 mg by mouth every 6 (six) hours as needed for mild pain.   Yes Historical Provider, MD  albuterol (PROVENTIL HFA;VENTOLIN HFA) 108 (90 BASE) MCG/ACT inhaler Inhale 1 puff into the lungs every 4 (four) hours as needed for wheezing or shortness of breath. 08/22/15  Yes Iline Oven, MD  ALPRAZolam Duanne Moron) 0.25 MG tablet Take 0.25 mg by mouth 2 (two) times daily as needed for anxiety.   Yes Historical Provider, MD  aspirin 81 MG chewable tablet Chew 81 mg by mouth daily.   Yes Historical Provider, MD  busPIRone (BUSPAR) 7.5 MG tablet Take 1 tablet (7.5 mg  total) by mouth 2 (two) times daily. 08/22/15  Yes Iline Oven, MD  butalbital-acetaminophen-caffeine (FIORICET) [phone] MG tablet Take 1-2 tablets by mouth every 6 (six) hours as needed for headache. 08/22/15 08/21/16 Yes Iline Oven, MD  cyproheptadine (PERIACTIN) 4 MG tablet Take 4 mg by mouth daily as needed (appetitie stimulant).   Yes Historical Provider, MD  escitalopram (LEXAPRO) 20 MG tablet Take 20 mg by mouth daily.   Yes Historical Provider, MD  Fluticasone-Salmeterol (ADVAIR) 500-50 MCG/DOSE AEPB Inhale 1 puff into the lungs 2 (two) times daily. 08/22/15  Yes Iline Oven, MD  ipratropium-albuterol (DUONEB) 0.5-2.5 (3) MG/3ML SOLN Take 3 mLs by nebulization every 6 (six) hours as needed (shortness of breath).   Yes Historical Provider, MD  lidocaine (LIDODERM) 5 % Place 1 patch onto the skin daily as  needed (pain). Remove & Discard patch within 12 hours or as directed by MD   Yes Historical Provider, MD  lisinopril (PRINIVIL,ZESTRIL) 2.5 MG tablet Take 2.5 mg by mouth daily.   Yes Historical Provider, MD  Melatonin 3 MG TABS Take 1 tablet by mouth at bedtime as needed (sleep).   Yes Historical Provider, MD  Multiple Vitamin (MULTIVITAMIN WITH MINERALS) TABS tablet Take 1 tablet by mouth daily.   Yes Historical Provider, MD  predniSONE (DELTASONE) 20 MG tablet 10/22-10/25: Take (40 mg) 2 tablets daily; 10/26: take (20 mg) 1 tablet daily until followup Patient taking differently: Take 20 mg by mouth See admin instructions. 10/22-10/25: Take (40 mg) 2 tablets daily; 10/26: take (20 mg) 1 tablet daily until followup.  Patient takes taper when needed 08/22/15  Yes Iline Oven, MD  promethazine (PHENERGAN) 12.5 MG tablet Take 12.5 mg by mouth every 6 (six) hours as needed for nausea or vomiting.   Yes Historical Provider, MD  Umeclidinium Bromide (INCRUSE ELLIPTA) 62.5 MCG/INH AEPB Inhale 1 puff into the lungs daily. 08/22/15  Yes Iline Oven, MD   Allergies  Allergen Reactions  . Penicillins Rash    Has patient had a PCN reaction causing immediate rash, facial/tongue/throat swelling, SOB or lightheadedness with hypotension: {Yes Has patient had a PCN reaction causing severe rash involving mucus membranes or skin necrosis:NO Has patient had a PCN reaction that required hospitalization {Yes Has patient had a PCN reaction occurring within the last 10 years:NO If all of the above answers are "NO", then may proceed with Cephalosporin use.   CBC:    Component Value Date/Time   WBC 11.2* 08/27/2015 0130   HGB 12.6 08/27/2015 0130   HCT 38.0 08/27/2015 0130   PLT 507* 08/27/2015 0130   MCV 95.7 08/27/2015 0130   NEUTROABS 6.5 08/25/2015 2116   LYMPHSABS 2.9 08/25/2015 2116   MONOABS 0.5 08/25/2015 2116   EOSABS 0.0 08/25/2015 2116   BASOSABS 0.0 08/25/2015 2116   Comprehensive  Metabolic Panel:    Component Value Date/Time   NA 130* 08/27/2015 0803   K 4.5 08/27/2015 0803   CL 88* 08/27/2015 0803   CO2 34* 08/27/2015 0803   BUN 21* 08/27/2015 0803   CREATININE 0.67 08/27/2015 0803   GLUCOSE 125* 08/27/2015 0803   CALCIUM 8.5* 08/27/2015 0803   AST 31 08/25/2015 2116   ALT 21 08/25/2015 2116   ALKPHOS 67 08/25/2015 2116   BILITOT 0.9 08/25/2015 2116   PROT 7.1 08/25/2015 2116   ALBUMIN 4.1 08/25/2015 2116    Physical Exam: Vital Signs: BP 190/70 mmHg  Pulse 117  Temp(Src) 98 F (36.7 C) (Oral)  Resp 21  Ht 5' 2.5" (1.588 m)  Wt 38 kg (83 lb 12.4 oz)  BMI 15.07 kg/m2  SpO2 96% SpO2: SpO2: 96 % O2 Device: O2 Device: Nasal Cannula O2 Flow Rate: O2 Flow Rate (L/min): 2 L/min Intake/output summary:  Intake/Output Summary (Last 24 hours) at 08/27/15 1856 Last data filed at 08/27/15 1455  Gross per 24 hour  Intake 1100.36 ml  Output   2800 ml  Net -1699.64 ml   LBM: Last BM Date: 08/25/15 Baseline Weight: Weight: 43.5 kg (95 lb 14.4 oz) Most recent weight: Weight: 38 kg (83 lb 12.4 oz)  Exam Findings:  Frail, appears older than stated age. Mild respiratory distress.  HENT:  Head: Normocephalic and atraumatic.  Eyes: EOM are normal. No scleral icterus.  Neck: No JVD present. No tracheal deviation present.  Cardiovascular: Tachycardic, no murmur  Pulmonary/Chest:  Decreased air movement throughout. No significant wheezing.  Abdominal: Soft. She exhibits no distension. There is no tenderness. There is no rebound and no guarding.  Musculoskeletal: She exhibits no edema.  Neurological: She is alert and oriented to person, place         Palliative Performance Scale70               Additional Data Reviewed: Recent Labs     08/26/15  0653  08/26/15  1050  08/27/15  0130  08/27/15  0803  WBC   --   6.9  11.2*   --   HGB   --   13.7  12.6   --   PLT   --   642*  507*   --   NA  129*   --    --   130*  BUN  28*   --    --   21*    CREATININE  0.90   --    --   0.67     Time In: 1315 Time Out: 1430  Time Total: 75 Greater than 50%  of this time was spent counseling and coordinating care related to the above assessment and plan.  Signed by: Micheline Rough, MD  Micheline Rough, MD  08/27/2015, 6:56 PM  Please contact Palliative Medicine Team phone at [phone] for questions and concerns.

## 2015-08-27 NOTE — Progress Notes (Signed)
Patient still not ready for treatment. RN aware. Stated she will given whenever patient is ready. Patient is not ready for CPAP either, will call when ready.

## 2015-08-27 NOTE — Progress Notes (Signed)
PULMONARY / CRITICAL CARE MEDICINE   Name: Megan [name] MRN: [id number] DOB: 1951/03/04    ADMISSION DATE:  08/25/2015 CONSULTATION DATE:  08/25/2015  REFERRING MD :  EDP  CHIEF COMPLAINT:  SOB  INITIAL PRESENTATION: 64 year old female with history severe COPD, recent admission (d/c 3 days prior to admit).  Has remained short of breath since that time and presented again on 10/24 complaining of shortness of breath. Blood gas in ED showed profound respiratory acidosis requiring BiPAP. PCCM asked to see.  STUDIES:  CXR admission > hyperinflation, flattened diaphragm. 6/20 CT chest [facility])  > There are scattered bilateral irregular nodular opacities which are unchanged in size compared to prior examination. For example a partially solid and partially groundglass nodule in the left lower lobe measures up to 1.7 cm, previously 1.7 cm (series 2, image 49). A nodule in the right lower lobe (series 2, image 32) measures 6 mm, previous is 6 mm. Centrilobular emphysema. F/u CT recommended in 12 months.  3/2 PFT([facility]) > FEV1/FCV pre 33.6, post 34.48%. DLCO 56.5%   SIGNIFICANT EVENTS: 10/17 - 10/21 > admit for COPD exacerbation.   SUBJECTIVE:  Slept well with bipap overnight.  Still SOB but improving slowly. Cough with green sputum.   Significvant weight loss over last few months.   VITAL SIGNS: Temp:  [97.8 F (36.6 C)-98.3 F (36.8 C)] 98 F (36.7 C) (10/26 0511) Pulse Rate:  [0-133] 82 (10/26 0816) Resp:  [0-30] 18 (10/26 0816) BP: (114-179)/(59-106) 152/67 mmHg (10/26 0816) SpO2:  [0 %-100 %] 98 % (10/26 0816) Weight:  [83 lb 12.4 oz (38 kg)-84 lb 3.2 oz (38.193 kg)] 83 lb 12.4 oz (38 kg) (10/26 0511)    INTAKE / OUTPUT:  Intake/Output Summary (Last 24 hours) at 08/27/15 0930 Last data filed at 08/27/15 0755  Gross per 24 hour  Intake 1044.36 ml  Output   1900 ml  Net -855.64 ml    PHYSICAL EXAMINATION: General:  Female appears older than stated age, cachectic, NAD OOB in chair   Neuro: Alert, oriented, non-focal HEENT:   [place]/AT, no JVD, PERRL Cardiovascular:  Tachy, regular, no MRG. No edema Lungs:  resps even non labored on [place], prolonged exhalation, diminished throughout with exp wheeze  Abdomen:  Soft, non-tender, non-distended Musculoskeletal:  No acute deformity or ROM limitation, no edema Skin:  Grossly intact  LABS:  CBC  Recent Labs Lab 08/25/15 2116 08/25/15 2124 08/26/15 1050 08/27/15 0130  WBC 9.9  --  6.9 11.2*  HGB 15.2* 16.7* 13.7 12.6  HCT 43.1 49.0* 40.7 38.0  PLT PLATELET CLUMPS NOTED ON SMEAR, COUNT APPEARS ADEQUATE  --  642* 507*   Coag's  Recent Labs Lab 08/26/15 1505  INR 1.12   BMET  Recent Labs Lab 08/25/15 2116 08/25/15 2124 08/26/15 0653 08/27/15 0803  NA 129* 129* 129* 130*  K 4.9 4.7 4.5 4.5  CL 86* 89* 86* 88*  CO2 26  --  31 34*  BUN 20 28* 28* 21*  CREATININE 0.87 0.90 0.90 0.67  GLUCOSE 110* 112* 139* 125*   Electrolytes  Recent Labs Lab 08/25/15 2116 08/26/15 0653 08/27/15 0803  CALCIUM 9.1 8.8* 8.5*   Sepsis Markers No results for input(s): LATICACIDVEN, PROCALCITON, O2SATVEN in the last 168 hours. ABG  Recent Labs Lab 08/25/15 2128 08/25/15 2322  PHART 7.218* 7.315*  PCO2ART 86.2* 64.7*  PO2ART 100.0 74.0*   Liver Enzymes  Recent Labs Lab 08/22/15 0330 08/25/15 2116  AST 27 31  ALT 18 21  ALKPHOS 71 67  BILITOT 0.8 0.9  ALBUMIN 3.5 4.1   Cardiac Enzymes  Recent Labs Lab 08/26/15 0653 08/26/15 1050 08/26/15 2350  TROPONINI 0.20* 0.15* 0.22*   Glucose No results for input(s): GLUCAP in the last 168 hours.  Imaging No results found.   ASSESSMENT / PLAN:  Acute on chronic hypercarbic respiratory failure Severe COPD with acute exacerbation (on home O2)  -- often lost to f/u due to multiple moves ([place], [place], [place]) Multiple lung nodules (as above) described on recent admission Elevated troponin on admit - 0.20 Hx CHF   PLAN -  PRN bipap  Continue IV  steroids  BD's  Mobilize  Consider f/u CT eval nodules  Doubt need to r/o PE given known severe COPD c/b anxiety as most likely explanation for dyspnea - normal RV on echo 10/21  Pulmonary hygiene - add flutter  Supplemental O2  Manage anxiety  outpt pulm f/u    Nickolas Madrid, NP 08/27/2015  9:30 AM Pager: [phone] or [phone]

## 2015-08-28 LAB — BASIC METABOLIC PANEL
Anion gap: 6 (ref 5–15)
BUN: 13 mg/dL (ref 6–20)
CALCIUM: 8.7 mg/dL — AB (ref 8.9–10.3)
CO2: 39 mmol/L — AB (ref 22–32)
CREATININE: 0.61 mg/dL (ref 0.44–1.00)
Chloride: 88 mmol/L — ABNORMAL LOW (ref 101–111)
GFR calc Af Amer: >60 mL/min (ref >60)
GLUCOSE: 131 mg/dL — AB (ref 65–99)
Potassium: 4.6 mmol/L (ref 3.5–5.1)
Sodium: 133 mmol/L — ABNORMAL LOW (ref 135–145)

## 2015-08-28 MED ORDER — POLYETHYLENE GLYCOL 3350 17 G PO PACK
17.0000 g | PACK | Freq: Every day | ORAL | Status: DC
  Administered 2015-08-28 – 2015-08-29 (×2): 17 g via ORAL
  Filled 2015-08-28 (×2): qty 1

## 2015-08-28 MED ORDER — ESCITALOPRAM OXALATE 10 MG PO TABS
40.0000 mg | ORAL_TABLET | Freq: Every day | ORAL | Status: DC
  Administered 2015-08-28 – 2015-08-29 (×2): 40 mg via ORAL
  Filled 2015-08-28: qty 4
  Filled 2015-08-28 (×2): qty 2

## 2015-08-28 MED ORDER — AZITHROMYCIN 250 MG PO TABS
250.0000 mg | ORAL_TABLET | Freq: Every day | ORAL | Status: DC
  Administered 2015-08-28 – 2015-08-29 (×2): 250 mg via ORAL
  Filled 2015-08-28 (×3): qty 1

## 2015-08-28 MED ORDER — BUSPIRONE HCL 5 MG PO TABS
15.0000 mg | ORAL_TABLET | Freq: Two times a day (BID) | ORAL | Status: DC
  Administered 2015-08-28 – 2015-08-29 (×3): 15 mg via ORAL
  Filled 2015-08-28: qty 2
  Filled 2015-08-28 (×4): qty 1

## 2015-08-28 MED FILL — Verapamil HCl IV Soln 2.5 MG/ML: INTRAVENOUS | Qty: 2 | Status: AC

## 2015-08-28 NOTE — Clinical Documentation Improvement (Addendum)
  Internal Medicine  (please document your query response in the progress notes and discharge summary, not on the query form itself.)  "CHF" is documented in the Past Medical History section of the current medical record.  If possible, please document the ACUITY and TYPE of CHF monitored and evaluated this admission.  (please note that probable, likely and suspected diagnoses are permitted with inpatient documentation.)   If the diagnosis of CHF is not applicable to this admission, please addend your documentation accordingly.  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 A specific answer is not anticipated or expected.   Thank You,  Erling Conte  RN BSN CCDS [phone] Health Information Management [place]

## 2015-08-28 NOTE — Progress Notes (Signed)
Dr. Lovena Le ordered overnight pulse oximetry for pt.  I explained to him that respiratory did not have the machine and that we would place the pt on a continuous pulse ox and have respiratory check on her overnight.  Will continue to monitor.

## 2015-08-28 NOTE — Progress Notes (Signed)
I stopped by to meet with [name] today. I spoke with her bedside nurse who reports that [name] had noted that she does not want to "burden" her children with having to make decisions about her care.  [name] was in the process of transferring rooms when I stopped to meet with her.  She reports that she does not really want to talk further today but is interested me coming by her room tomorrow in order to continue discussions regarding her care moving forward.  I'll plan to follow-up tomorrow morning per [name] request.  Micheline Rough, MD [place] Team [phone]

## 2015-08-28 NOTE — Evaluation (Signed)
Physical Therapy Evaluation Patient Details Name: Megan [name] MRN: [id number] DOB: December 25, 1950 Today's Date: 08/28/2015   History of Present Illness  64 year old woman with progressive gold stage 4D COPD comes to the emergency department with SOB. She has had at least 3 other COPD exacerbations in the last year requiring hospitalization including recent d/c from hospital within the last week.  Clinical Impression  Pt admitted with above diagnosis. Pt currently with functional limitations due to the deficits listed below (see PT Problem List). Pt will benefit from skilled PT to increase their independence and safety with mobility to allow discharge to the venue listed below.  After discussion with pt on current DME and home set-up (staying at daughter's house), recommend HHPT, RW, and 3-1 BSC.  HR up to 130 with gait.  o2 checked on RA per nurse request for home o2 qualification with it 85% on RA.     Follow Up Recommendations Home health PT    Equipment Recommendations  Rolling walker with 5" wheels;3in1 (PT) (reports the rollator is too heavy and too big for daughter's house)    Recommendations for Other Services       Precautions / Restrictions Precautions Precaution Comments: o2 dependent Restrictions Weight Bearing Restrictions: No      Mobility  Bed Mobility               General bed mobility comments: sitting in recliner upon arrival  Transfers Overall transfer level: Needs assistance Equipment used: Rolling walker (2 wheeled) Transfers: Sit to/from Stand Sit to Stand: Min guard         General transfer comment: min/guard for steadying to RW.  Moves quickly with sit > stand  Ambulation/Gait Ambulation/Gait assistance: Min guard Ambulation Distance (Feet): 40 Feet Assistive device: Rolling walker (2 wheeled) Gait Pattern/deviations: Decreased step length - right;Decreased step length - left Gait velocity: decreased Gait velocity interpretation: Below normal  speed for age/gender General Gait Details: Pt ambulated with decreased step length and very deliberate gait, but not quite a shuffle and needed min/guard only for steadying. amb with o2 intact at 3L/min then 2 L/min at 96% with HR 126-130 bpm.  Stairs            Wheelchair Mobility    Modified Rankin (Stroke Patients Only)       Balance Overall balance assessment: Needs assistance Sitting-balance support: No upper extremity supported;Feet supported Sitting balance-Leahy Scale: Good     Standing balance support: Bilateral upper extremity supported Standing balance-Leahy Scale: Fair                               Pertinent Vitals/Pain Pain Assessment: No/denies pain    Home Living Family/patient expects to be discharged to:: Private residence Living Arrangements: Children (lives with daughter and young grandchildren) Available Help at Discharge: Family Type of Home: House   Entrance Stairs-Rails: None Entrance Stairs-Number of Steps: 2 Home Layout: One level Home Equipment: Cane - single point;Walker - 4 wheels      Prior Function Level of Independence: Independent               Hand Dominance   Dominant Hand: Right    Extremity/Trunk Assessment   Upper Extremity Assessment: Defer to OT evaluation           Lower Extremity Assessment: Overall WFL for tasks assessed;Generalized weakness      Cervical / Trunk Assessment: Normal  Communication   Communication: No  difficulties  Cognition Arousal/Alertness: Awake/alert Behavior During Therapy: WFL for tasks assessed/performed Overall Cognitive Status: Within Functional Limits for tasks assessed                      General Comments General comments (skin integrity, edema, etc.): Pt reports feeling overwhelmed by her medical diagnosis and wondering about her prognosis.  Discussed home safety and energy conservation.  Spoke with nurse regarding pt wanting to speak to  chaplain.    Exercises        Assessment/Plan    PT Assessment Patient needs continued PT services  PT Diagnosis Difficulty walking   PT Problem List Cardiopulmonary status limiting activity;Decreased activity tolerance;Decreased balance;Decreased mobility  PT Treatment Interventions Gait training;Functional mobility training;Therapeutic activities;Therapeutic exercise;Balance training   PT Goals (Current goals can be found in the Care Plan section) Acute Rehab PT Goals Patient Stated Goal: get back to her old self PT Goal Formulation: With patient Time For Goal Achievement: 09/11/15 Potential to Achieve Goals: Fair    Frequency Min 3X/week   Barriers to discharge Decreased caregiver support      Co-evaluation PT/OT/SLP Co-Evaluation/Treatment: Yes Reason for Co-Treatment: For patient/therapist safety PT goals addressed during session: Balance;Mobility/safety with mobility         End of Session Equipment Utilized During Treatment: Gait belt Activity Tolerance: Patient tolerated treatment well Patient left: in chair;with call bell/phone within reach Nurse Communication: Mobility status         Time: 1349-1425 PT Time Calculation (min) (ACUTE ONLY): 36 min   Charges:   PT Evaluation $Initial PT Evaluation Tier I: 1 Procedure     PT G Codes:        Megan [name] 08/28/2015, 2:40 PM

## 2015-08-28 NOTE — Progress Notes (Signed)
Patient stated she was not ready to wear CPAP. Stated she would have nurse call RT when she is ready to put it on.

## 2015-08-28 NOTE — Progress Notes (Addendum)
PULMONARY / CRITICAL CARE MEDICINE   Name: Megan [name] MRN: [id number] DOB: 1951/04/11    ADMISSION DATE:  08/25/2015 CONSULTATION DATE:  08/25/2015  REFERRING MD :  EDP  CHIEF COMPLAINT:  SOB  INITIAL PRESENTATION: 64 year old female with history severe COPD, recent admission (d/c 3 days prior to admit).  Has remained short of breath since that time and presented again on 10/24 complaining of shortness of breath. Blood gas in ED showed profound respiratory acidosis requiring BiPAP. She had troponin leak with cardiac cath showing no significant CAD  STUDIES:  CXR admission > hyperinflation, flattened diaphragm. 6/20 CT chest [facility])  > There are scattered bilateral irregular nodular opacities which are unchanged in size compared to prior examination. For example a partially solid and partially groundglass nodule in the left lower lobe measures up to 1.7 cm, previously 1.7 cm (series 2, image 49). A nodule in the right lower lobe (series 2, image 32) measures 6 mm, previous is 6 mm. Centrilobular emphysema. F/u CT recommended in 12 months.  3/2 PFT([facility]) > FEV1/FCV pre 33.6, post 34.48%. DLCO 56.5% 10/25 cath >> neg  10/26 CT angio - neg PE, ASD LLL minimal, severe emphysema, 42m RML nodule   SIGNIFICANT EVENTS: 10/17 - 10/21 > admit for COPD exacerbation.   SUBJECTIVE:   Did not use CPAP overnight Still SOB but improving slowly. Cough with green sputum.    Desaturates while using bedside commode on 2L [place]  VITAL SIGNS: Temp:  [97.7 F (36.5 C)-98.4 F (36.9 C)] 97.7 F (36.5 C) (10/27 0504) Pulse Rate:  [90-117] 96 (10/27 0504) Resp:  [19-25] 25 (10/27 0504) BP: (134-190)/(70-99) 134/74 mmHg (10/27 0504) SpO2:  [91 %-98 %] 91 % (10/27 0504) Weight:  [87 lb 15.4 oz (39.9 kg)] 87 lb 15.4 oz (39.9 kg) (10/27 0504)    INTAKE / OUTPUT:  Intake/Output Summary (Last 24 hours) at 08/28/15 0834 Last data filed at 08/27/15 2300  Gross per 24 hour  Intake   1440 ml  Output   1800  ml  Net   -360 ml    PHYSICAL EXAMINATION: General:  Female appears older than stated age, cachectic,  Neuro: Alert, oriented, non-focal HEENT:   [place]/AT, no JVD, PERRL Cardiovascular:  Tachy, regular, no MRG. No edema Lungs:  resps even non labored on [place], prolonged exhalation, diminished throughout with exp wheeze  Abdomen:  Soft, non-tender, non-distended Musculoskeletal:  No acute deformity or ROM limitation, no edema Skin:  Grossly intact  LABS:  CBC  Recent Labs Lab 08/25/15 2116 08/25/15 2124 08/26/15 1050 08/27/15 0130  WBC 9.9  --  6.9 11.2*  HGB 15.2* 16.7* 13.7 12.6  HCT 43.1 49.0* 40.7 38.0  PLT PLATELET CLUMPS NOTED ON SMEAR, COUNT APPEARS ADEQUATE  --  642* 507*   Coag's  Recent Labs Lab 08/26/15 1505  INR 1.12   BMET  Recent Labs Lab 08/26/15 0653 08/27/15 0803 08/28/15 0750  NA 129* 130* 133*  K 4.5 4.5 4.6  CL 86* 88* 88*  CO2 31 34* 39*  BUN 28* 21* 13  CREATININE 0.90 0.67 0.61  GLUCOSE 139* 125* 131*   Electrolytes  Recent Labs Lab 08/26/15 0653 08/27/15 0803 08/28/15 0750  CALCIUM 8.8* 8.5* 8.7*   Sepsis Markers No results for input(s): LATICACIDVEN, PROCALCITON, O2SATVEN in the last 168 hours. ABG  Recent Labs Lab 08/25/15 2128 08/25/15 2322  PHART 7.218* 7.315*  PCO2ART 86.2* 64.7*  PO2ART 100.0 74.0*   Liver Enzymes  Recent Labs Lab 08/22/15 0330  08/25/15 2116  AST 27 31  ALT 18 21  ALKPHOS 71 67  BILITOT 0.8 0.9  ALBUMIN 3.5 4.1   Cardiac Enzymes  Recent Labs Lab 08/26/15 0653 08/26/15 1050 08/26/15 2350  TROPONINI 0.20* 0.15* 0.22*   Glucose No results for input(s): GLUCAP in the last 168 hours.  Imaging Ct Angio Chest Pe W/cm &/or Wo Cm  08/27/2015  CLINICAL DATA:  Acute onset of shortness of breath and hypoxia. Initial encounter. EXAM: CT ANGIOGRAPHY CHEST WITH CONTRAST TECHNIQUE: Multidetector CT imaging of the chest was performed using the standard protocol during bolus administration of  intravenous contrast. Multiplanar CT image reconstructions and MIPs were obtained to evaluate the vascular anatomy. CONTRAST:  30m OMNIPAQUE IOHEXOL 350 MG/ML SOLN COMPARISON:  Chest radiograph performed 08/25/2015 FINDINGS: There is no evidence of pulmonary embolus. Bilateral emphysematous change is noted. There is opacification of the bronchioles to the left lower lung lobe, with minimal underlying opacity, compatible with aspiration. A 5 mm nodule is noted at the right middle lobe (image 50 of 107). There is no evidence of pleural effusion or pneumothorax. No masses are identified; no abnormal focal contrast enhancement is seen. The mediastinum is unremarkable in appearance. A small hiatal hernia is noted. No pericardial effusion is identified. No mediastinal lymphadenopathy is seen. The great vessels are grossly unremarkable in appearance. No axillary lymphadenopathy is seen. The thyroid gland is unremarkable in appearance. The visualized portions of the liver and spleen are unremarkable. The visualized portions of the pancreas, stomach, adrenal glands and kidneys are within normal limits. No acute osseous abnormalities are seen. Review of the MIP images confirms the above findings. IMPRESSION: 1. No evidence of pulmonary embolus. 2. Opacification of the bronchioles to the left lower lung lobe, with underlying minimal airspace opacity, compatible with aspiration. 3. Bilateral emphysematous change noted. 4. 5 mm nodule at the right middle lobe. If the patient is at high risk for bronchogenic carcinoma, follow-up chest CT at 6-12 months is recommended. If the patient is at low risk for bronchogenic carcinoma, follow-up chest CT at 12 months is recommended. This recommendation follows the consensus statement: Guidelines for Management of Small Pulmonary Nodules Detected on CT Scans: A Statement from the [place] published in Radiology 2005;237:395-400. 5. Small hiatal hernia noted. Electronically  Signed   By: JGarald BaldingM.D.   On: 08/27/2015 22:35     ASSESSMENT / PLAN:  Acute on chronic hypercarbic respiratory failure Severe COPD with acute exacerbation (on home O2)  -- often lost to f/u due to multiple moves ([place], [place], [place]) Elevated troponin on admit - 0.20 Hx CHF   PLAN -  PRN bipap  PO prednisone  - taper slow over 174month 10 mg/ wek BD's  Mobilize  Add spiriva to dulera on discharge  Supplemental O2  - OK to increase to 3L [place] on exertion Manage anxiety  - OK for low dose xanax   RML  lung nodule - needs 28m38m   outpt pulm f/u has been arranged Palliative conversations noted  - pt reiterates 'dont take away hope' 'I  have a lot to live for'  PCCM to  sign off  08/28/2015  8:34 AM

## 2015-08-28 NOTE — Progress Notes (Addendum)
Subjective: Megan [name].  Patient reports improved breathing and cough. Her only complaint is the number of times she was stuck for IV access.  She does note that anxiety adds to her symptoms. She spoke with Palliative Care yesterday without much headway, as she does not wish to discuss her prognosis or end of life care.  Objective: Vital signs in last 24 hours: Filed Vitals:   08/28/15 0000 08/28/15 0504 08/28/15 0817 08/28/15 0918  BP:  134/74 161/91   Pulse: 112 96 99 97  Temp:  97.7 F (36.5 C) 97.7 F (36.5 C)   TempSrc:  Oral Oral   Resp: '19 25 21 21  '$ Height:      Weight:  87 lb 15.4 oz (39.9 kg)    SpO2: 94% 91% 92% 92%   Weight change: -3 lb 0.6 oz (-1.377 kg)  Intake/Output Summary (Last 24 hours) at 08/28/15 1121 Last data filed at 08/27/15 2300  Gross per 24 hour  Intake   1200 ml  Output   1800 ml  Net   -600 ml   Physical Exam  Constitutional: She is oriented to person, place, and time.  Frail, appears older than stated age. No respiratory distress.  Breathing comfortably on 2L [place]  HENT:  Head: Normocephalic and atraumatic.  Eyes: EOM are normal. No scleral icterus.  Neck: No JVD present. No tracheal deviation present.  Cardiovascular: Normal heart sounds and intact distal pulses.   Borderline tachycardic. Regular rhythm.  Pulmonary/Chest:  Decreased air movement throughout. No wheezes appreciated following duoneb. No respiratory distress.  Abdominal: Soft. She exhibits no distension. There is no tenderness. There is no rebound and no guarding.  Musculoskeletal: She exhibits no edema.  No calf swelling or tenderness.  Neurological: She is alert and oriented to person, place, and time.  Skin: Skin is warm and dry. No rash noted.    Lab Results: Basic Metabolic Panel:  Recent Labs Lab 08/27/15 0803 08/28/15 0750  NA 130* 133*  K 4.5 4.6  CL 88* 88*  CO2 34* 39*  GLUCOSE 125* 131*  BUN 21* 13  CREATININE 0.67 0.61  CALCIUM 8.5* 8.7*   Liver  Function Tests:  Recent Labs Lab 08/22/15 0330 08/25/15 2116  AST 27 31  ALT 18 21  ALKPHOS 71 67  BILITOT 0.8 0.9  PROT 6.8 7.1  ALBUMIN 3.5 4.1   No results for input(s): LIPASE, AMYLASE in the last 168 hours. No results for input(s): AMMONIA in the last 168 hours. CBC:  Recent Labs Lab 08/25/15 2116  08/26/15 1050 08/27/15 0130  WBC 9.9  --  6.9 11.2*  NEUTROABS 6.5  --   --   --   HGB 15.2*  < > 13.7 12.6  HCT 43.1  < > 40.7 38.0  MCV 93.1  --  94.0 95.7  PLT PLATELET CLUMPS NOTED ON SMEAR, COUNT APPEARS ADEQUATE  --  642* 507*  < > = values in this interval not displayed. Cardiac Enzymes:  Recent Labs Lab 08/26/15 0653 08/26/15 1050 08/26/15 2350  TROPONINI 0.20* 0.15* 0.22*   BNP: No results for input(s): PROBNP in the last 168 hours. D-Dimer: No results for input(s): DDIMER in the last 168 hours. CBG: No results for input(s): GLUCAP in the last 168 hours. Hemoglobin A1C: No results for input(s): HGBA1C in the last 168 hours. Fasting Lipid Panel: No results for input(s): CHOL, HDL, LDLCALC, TRIG, CHOLHDL, LDLDIRECT in the last 168 hours. Thyroid Function Tests: No results for input(s): TSH,  T4TOTAL, FREET4, T3FREE, THYROIDAB in the last 168 hours. Coagulation:  Recent Labs Lab 08/26/15 1505  LABPROT 14.6  INR 1.12   Anemia Panel: No results for input(s): VITAMINB12, FOLATE, FERRITIN, TIBC, IRON, RETICCTPCT in the last 168 hours. Urine Drug Screen: Drugs of Abuse  No results found for: LABOPIA, COCAINSCRNUR, LABBENZ, AMPHETMU, THCU, LABBARB  Alcohol Level: No results for input(s): ETH in the last 168 hours. Urinalysis:  Recent Labs Lab 08/25/15 2130  COLORURINE YELLOW  LABSPEC 1.019  PHURINE 6.0  GLUCOSEU NEGATIVE  HGBUR SMALL*  BILIRUBINUR NEGATIVE  KETONESUR 40*  PROTEINUR 30*  UROBILINOGEN 0.2  NITRITE NEGATIVE  LEUKOCYTESUR NEGATIVE   Misc. Labs:   Micro Results: Recent Results (from the past 240 hour(s))  Urine culture      Status: None   Collection Time: 08/25/15  9:30 PM  Result Value Ref Range Status   Specimen Description URINE, CATHETERIZED  Final   Special Requests NONE  Final   Culture NO GROWTH 2 DAYS  Final   Report Status 08/27/2015 FINAL  Final  MRSA PCR Screening     Status: Abnormal   Collection Time: 08/26/15  3:29 AM  Result Value Ref Range Status   MRSA by PCR POSITIVE (A) NEGATIVE Final    Comment:        The GeneXpert MRSA Assay (FDA approved for NASAL specimens only), is one component of a comprehensive MRSA colonization surveillance program. It is not intended to diagnose MRSA infection nor to guide or monitor treatment for MRSA infections. RESULT CALLED TO, READ BACK BY AND VERIFIED WITH: A PETTIFORD '@0530'$  08/26/15 MKELLY    Studies/Results: Ct Angio Chest Pe W/cm &/or Wo Cm  08/27/2015  CLINICAL DATA:  Acute onset of shortness of breath and hypoxia. Initial encounter. EXAM: CT ANGIOGRAPHY CHEST WITH CONTRAST TECHNIQUE: Multidetector CT imaging of the chest was performed using the standard protocol during bolus administration of intravenous contrast. Multiplanar CT image reconstructions and MIPs were obtained to evaluate the vascular anatomy. CONTRAST:  2m OMNIPAQUE IOHEXOL 350 MG/ML SOLN COMPARISON:  Chest radiograph performed 08/25/2015 FINDINGS: There is no evidence of pulmonary embolus. Bilateral emphysematous change is noted. There is opacification of the bronchioles to the left lower lung lobe, with minimal underlying opacity, compatible with aspiration. A 5 mm nodule is noted at the right middle lobe (image 50 of 107). There is no evidence of pleural effusion or pneumothorax. No masses are identified; no abnormal focal contrast enhancement is seen. The mediastinum is unremarkable in appearance. A small hiatal hernia is noted. No pericardial effusion is identified. No mediastinal lymphadenopathy is seen. The great vessels are grossly unremarkable in appearance. No axillary  lymphadenopathy is seen. The thyroid gland is unremarkable in appearance. The visualized portions of the liver and spleen are unremarkable. The visualized portions of the pancreas, stomach, adrenal glands and kidneys are within normal limits. No acute osseous abnormalities are seen. Review of the MIP images confirms the above findings. IMPRESSION: 1. No evidence of pulmonary embolus. 2. Opacification of the bronchioles to the left lower lung lobe, with underlying minimal airspace opacity, compatible with aspiration. 3. Bilateral emphysematous change noted. 4. 5 mm nodule at the right middle lobe. If the patient is at high risk for bronchogenic carcinoma, follow-up chest CT at 6-12 months is recommended. If the patient is at low risk for bronchogenic carcinoma, follow-up chest CT at 12 months is recommended. This recommendation follows the consensus statement: Guidelines for Management of Small Pulmonary Nodules Detected on CT Scans:  A Statement from the [place] as published in Radiology 2005;237:395-400. 5. Small hiatal hernia noted. Electronically Signed   By: Garald Balding M.D.   On: 08/27/2015 22:35   Medications: I have reviewed the patient's current medications. Scheduled Meds: . antiseptic oral rinse  7 mL Mouth Rinse BID  . azithromycin  250 mg Oral Daily  . busPIRone  15 mg Oral BID  . Chlorhexidine Gluconate Cloth  6 each Topical Q0600  . enoxaparin (LOVENOX) injection  20 mg Subcutaneous Q24H  . escitalopram  40 mg Oral Daily  . feeding supplement (ENSURE ENLIVE)  237 mL Oral TID BM  . ipratropium-albuterol  3 mL Nebulization QID  . mometasone-formoterol  2 puff Inhalation BID  . mupirocin ointment  1 application Nasal BID  . predniSONE  40 mg Oral Q breakfast  . sodium chloride  1,000 mL Intravenous Once  . sodium chloride  3 mL Intravenous Q12H   Continuous Infusions:   PRN Meds:.sodium chloride, acetaminophen, albuterol, ALPRAZolam, butalbital-acetaminophen-caffeine,  ondansetron (ZOFRAN) IV, sodium chloride Assessment/Plan: Principal Problem:   Acute exacerbation of chronic obstructive pulmonary disease (COPD) ([place]) Active Problems:   Multiple pulmonary nodules determined by computed tomography of lung   Essential hypertension   COPD exacerbation (HCC)   Protein-calorie malnutrition, severe   Anxiety state   Chronic post-traumatic stress disorder (PTSD)  Ms. Chivers is a 64 year old woman with a past medical history of COPD (Stage 4D), HTN, PTSD, lung nodules concerning for NSCLC, and anxiety who presents with worsening shortness of breath.  Acute on Chronic Respiratory Failure (COPD Stage 4D): Patient presents with worsening DOE and at rest.  She admits that running out of her Xanax made her dyspnea worse.  She has been taking her Prednisone, Azithromycin, Advair, and Albuterol.  She had not picked up her Incruse. It is possible that not picking up her LAMA may have led to worsening SOB, but it is also possible that her overall respiratory status is continuing to decline, further worsened by her anxiety.  In addition, ECG findings and troponinemia concerning for acute cardiac cause of dyspena.  Cath 10/25 negative for ischemic disease.  CTA negative for PE.  Symptoms now attributed to acute COPD exacerbation and anxiety attack.  Will adjust patient's Buspirone and Escitalopram.  Restart Azithro and continue Prednisone.  She is markedly improved today and ambulating well.  Likely discharge tomorrow. - Azithromycin 250 mg daily - Duoneb q4h - Prednisone 40 mg - Albuterol  - Dulera BID - BiPAP PRN  ST Elevation and Elevated Troponins: After viewing EKG on 10/18, there appear to be new ST elevations. Patient had previously been in sinus tach without ST changes.  Elevated troponins could be related to demand ischemia vs ACS vs PE.  No signs of PE on ECG, but patient is sinus tach, low SpO2, recently hospitalized, and relatively immobile. Cardiac cath negative  for ischemic coronary disease. CTA negative.   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 Concepcion Living from [facility] on 11/1  PTSD/Anxiety: chronic. Last evenings episode seems most consistent with anxiety attack during manipulation of IV. Attack alleviated with Xanax and Fioricet, both of which are GABAergic. Patient is on max dosing of Lexapro. Will add Buspar for added longer term benefit. - Escitalopram 40 mg daily - Xanax 0.25 mg BID PRN - Buspar 15 mg BID for added anxiety benefit - Ramelteon  Protein Malnutrition: Ensure  DVT Prophylaxis: Lovenox  Code  Status: Full  Dispo: Disposition is deferred at this time, awaiting improvement of current medical problems.  Anticipated discharge in approximately 1-2 day(s).   The patient does not have a current PCP (Pcp Not In System) and does not need an [facility] hospital follow-up appointment after discharge.  The patient does not have transportation limitations that hinder transportation to clinic appointments.  .Services Needed at time of discharge: Y = Yes, Blank = No PT:  HH PT  OT:   RN:  [place] RN  Equipment:  Home O2, rolator,   Other:     LOS: 3 days   Iline Oven, MD 08/28/2015, 11:21 AM

## 2015-08-28 NOTE — Care Management Note (Addendum)
Case Management Note  Patient Details  Name: Megan [name] MRN: [id number] Date of Birth: [birth year]  Subjective/Objective:                 Admitted with  Respiratory failure/COPD exacerbation,CP. Pt with hx of frequent hospital admissions, COPD (Stage 4D), HTN, PTSD( manifested three years ago after witnessing the suicide of her son) , lung nodules, anxiety, home 02. S/P cardiac cath  08/26/15  showing no significant CAD.   Action/Plan: Return to home when medically stable. CM to f/u with d/c needs.  Expected Discharge Date:                   Expected Discharge Plan:  [place]  In-House Referral:     Discharge planning Services  CM Consult  Post Acute Care Choice:  Durable Medical Equipment Choice offered to:     DME Arranged:  Bipap, Walker rolling DME Agency:  [place] Arranged:  PT, RN [facility] Agency:     Status of Service:  In process, will continue to follow  Medicare Important Message Given:    Date Medicare IM Given:    Medicare IM give by:    Date Additional Medicare IM Given:    Additional Medicare Important Message give by:     If discussed at [place] of Stay Meetings, dates discussed:    Additional Comments:  08/28/15 @ 1750 CM made referral with [facility] for HHRN/PT,Mary stated will f/u with CM in am.  08/28/15 @ 1700 CM received call from [facility] that agency will not be able to provide HHRN/PT services for pt.  Pt lives with daughter. Address verified by CM.  Referral made for HHRN,PT with [facility] and dme/bipap and rolling walker with [facility].     Megan [name] (Daughter)  [phone]  Sharin Mons, RN,BSN,CM  08/28/2015, 3:51 PM

## 2015-08-28 NOTE — Evaluation (Signed)
Occupational Therapy Evaluation Patient Details Name: Megan [name] MRN: [id number] DOB: April 18, 1951 Today's Date: 08/28/2015    History of Present Illness 64 year old woman with progressive gold stage 4D COPD comes to the emergency department with SOB. She has had at least 3 other COPD exacerbations in the last year requiring hospitalization including recent d/c from hospital within the last week.   Clinical Impression   Pt was fairly sedentary prior to admission, moving about her home holding the furniture, but able to perform self care.  She presents with generalized weakness, impaired activity tolerance and decreased standing balance interfering with ability to function at her baseline.  Pt educated in breathing techniques, pacing and benefits of sitting to shower. Recommending 3 in 1, pt agreeable.  Will follow acutely.    Follow Up Recommendations  No OT follow up;Supervision/Assistance - 24 hour    Equipment Recommendations  3 in 1 bedside comode    Recommendations for Other Services       Precautions / Restrictions Precautions Precaution Comments: o2 dependent Restrictions Weight Bearing Restrictions: No      Mobility Bed Mobility               General bed mobility comments: sitting in recliner upon arrival  Transfers Overall transfer level: Needs assistance Equipment used: Rolling walker (2 wheeled) Transfers: Sit to/from Stand Sit to Stand: Min guard         General transfer comment: min/guard for steadying to RW.  Moves quickly with sit > stand    Balance Overall balance assessment: Needs assistance Sitting-balance support: No upper extremity supported;Feet supported Sitting balance-Leahy Scale: Good     Standing balance support: Bilateral upper extremity supported Standing balance-Leahy Scale: Fair                              ADL Overall ADL's : Needs assistance/impaired Eating/Feeding: Independent;Sitting   Grooming: Wash/dry  hands;Oral care;Standing;Supervision/safety   Upper Body Bathing: Set up;Sitting   Lower Body Bathing: Supervison/ safety;Sit to/from stand   Upper Body Dressing : Set up;Sitting   Lower Body Dressing: Supervision/safety;Sit to/from stand;Sitting/lateral leans Lower Body Dressing Details (indicate cue type and reason): able to bring foot up to donn socks Toilet Transfer: Supervision/safety;Ambulation;RW   Toileting- Clothing Manipulation and Hygiene: Supervision/safety;Sit to/from stand       Functional mobility during ADLs: Supervision/safety;Rolling walker (02) General ADL Comments: instructed in purse lip breathing, pacing, benefits of sitting to shower     Vision     Perception     Praxis      Pertinent Vitals/Pain Pain Assessment: No/denies pain     Hand Dominance Right   Extremity/Trunk Assessment Upper Extremity Assessment Upper Extremity Assessment: Generalized weakness (fatigues with over head ADL)   Lower Extremity Assessment Lower Extremity Assessment: Defer to PT evaluation   Cervical / Trunk Assessment Cervical / Trunk Assessment: Normal   Communication Communication Communication: No difficulties   Cognition Arousal/Alertness: Awake/alert Behavior During Therapy: WFL for tasks assessed/performed Overall Cognitive Status: Within Functional Limits for tasks assessed                     General Comments       Exercises       Shoulder Instructions      Home Living Family/patient expects to be discharged to:: Private residence Living Arrangements: Children Available Help at Discharge: Family Type of Home: House Home Access: Stairs to enter [employer] of  Steps: 2 Entrance Stairs-Rails: None Home Layout: One level     Bathroom Shower/Tub: Teacher, early years/pre: Standard     Home Equipment: Cane - single point;Walker - 4 wheels   Additional Comments: was furniture walking prior to admission       Prior Functioning/Environment Level of Independence: Independent        Comments: plans to remain in [place] with daughter    OT Diagnosis: Generalized weakness   OT Problem List: Decreased activity tolerance;Decreased knowledge of use of DME or AE;Cardiopulmonary status limiting activity   OT Treatment/Interventions: Self-care/ADL training;Energy conservation;DME and/or AE instruction;Patient/family education    OT Goals(Current goals can be found in the care plan section) Acute Rehab OT Goals Patient Stated Goal: get back to her old self OT Goal Formulation: With patient Time For Goal Achievement: 09/04/15 Potential to Achieve Goals: Good ADL Goals Pt Will Perform Grooming: with modified independence;standing (3 activites) Pt Will Transfer to Toilet: with modified independence;ambulating;regular height toilet Pt Will Perform Toileting - Clothing Manipulation and hygiene: with modified independence;sit to/from stand Pt Will Perform Tub/Shower Transfer: Tub transfer;3 in 1;rolling walker;ambulating Additional ADL Goal #1: Pt will employ energy conservation and breathing techniques in ADL and mobility independently.  OT Frequency: Min 2X/week   Barriers to D/C:            Co-evaluation PT/OT/SLP Co-Evaluation/Treatment: Yes Reason for Co-Treatment: For patient/therapist safety PT goals addressed during session: Balance;Mobility/safety with mobility OT goals addressed during session: ADL's and self-care      End of Session Equipment Utilized During Treatment: Oxygen;Rolling walker;Gait belt Nurse Communication:  (pt wants to do advanced directives with chaplain)  Activity Tolerance: Patient limited by fatigue Patient left: in chair;with call bell/phone within reach   Time: 1354-1425 OT Time Calculation (min): 31 min Charges:  OT General Charges $OT Visit: 1 Procedure OT Evaluation $Initial OT Evaluation Tier I: 1 Procedure G-Codes:    Malka So 08/28/2015,  3:18 PM  [phone]

## 2015-08-28 NOTE — Progress Notes (Signed)
Once placed on CPAP she decided she did not want to wear. Placed by on 2 LPM nasal cannula. RN aware.

## 2015-08-29 DIAGNOSIS — Z515 Encounter for palliative care: Secondary | ICD-10-CM | POA: Insufficient documentation

## 2015-08-29 DIAGNOSIS — F411 Generalized anxiety disorder: Secondary | ICD-10-CM | POA: Diagnosis not present

## 2015-08-29 DIAGNOSIS — F4312 Post-traumatic stress disorder, chronic: Secondary | ICD-10-CM | POA: Diagnosis not present

## 2015-08-29 DIAGNOSIS — J439 Emphysema, unspecified: Secondary | ICD-10-CM | POA: Diagnosis not present

## 2015-08-29 DIAGNOSIS — R0602 Shortness of breath: Secondary | ICD-10-CM | POA: Diagnosis not present

## 2015-08-29 DIAGNOSIS — J441 Chronic obstructive pulmonary disease with (acute) exacerbation: Secondary | ICD-10-CM | POA: Diagnosis not present

## 2015-08-29 DIAGNOSIS — I10 Essential (primary) hypertension: Secondary | ICD-10-CM | POA: Diagnosis not present

## 2015-08-29 DIAGNOSIS — R05 Cough: Secondary | ICD-10-CM | POA: Diagnosis not present

## 2015-08-29 DIAGNOSIS — R918 Other nonspecific abnormal finding of lung field: Secondary | ICD-10-CM

## 2015-08-29 DIAGNOSIS — Z23 Encounter for immunization: Secondary | ICD-10-CM | POA: Diagnosis not present

## 2015-08-29 DIAGNOSIS — Z7189 Other specified counseling: Secondary | ICD-10-CM | POA: Diagnosis not present

## 2015-08-29 DIAGNOSIS — K219 Gastro-esophageal reflux disease without esophagitis: Secondary | ICD-10-CM | POA: Diagnosis not present

## 2015-08-29 DIAGNOSIS — M6281 Muscle weakness (generalized): Secondary | ICD-10-CM | POA: Diagnosis not present

## 2015-08-29 DIAGNOSIS — J80 Acute respiratory distress syndrome: Secondary | ICD-10-CM | POA: Diagnosis not present

## 2015-08-29 DIAGNOSIS — E43 Unspecified severe protein-calorie malnutrition: Secondary | ICD-10-CM | POA: Diagnosis not present

## 2015-08-29 MED ORDER — ESCITALOPRAM OXALATE 20 MG PO TABS: 40.0000 mg | ORAL_TABLET | Freq: Every day | ORAL | Status: DC

## 2015-08-29 MED ORDER — BUTALBITAL-APAP-CAFFEINE 50-325-40 MG PO TABS: 1.0000 | ORAL_TABLET | Freq: Two times a day (BID) | ORAL | Status: DC | PRN

## 2015-08-29 MED ORDER — PREDNISONE 20 MG PO TABS: 20.0000 mg | ORAL_TABLET | Freq: Every day | ORAL | Status: DC

## 2015-08-29 MED ORDER — BUSPIRONE HCL 7.5 MG PO TABS: 15.0000 mg | ORAL_TABLET | Freq: Two times a day (BID) | ORAL | Status: DC

## 2015-08-29 MED ORDER — AZITHROMYCIN 250 MG PO TABS: 250.0000 mg | ORAL_TABLET | Freq: Every day | ORAL | Status: DC

## 2015-08-29 MED ORDER — ALPRAZOLAM 0.25 MG PO TABS: 0.2500 mg | ORAL_TABLET | Freq: Two times a day (BID) | ORAL | Status: DC | PRN

## 2015-08-29 NOTE — Discharge Instructions (Signed)
1. Take Prednisone 20 mg daily 2. Take Azithromycin 250 mg daily 3. Continue inhalers as prescribed.

## 2015-08-29 NOTE — Progress Notes (Signed)
Physical Therapy Treatment Patient Details Name: Megan [name] MRN: [id number] DOB: 04-07-1951 Today's Date: 08/29/2015    History of Present Illness 64 year old woman with progressive gold stage 4D COPD comes to the emergency department with SOB. She has had at least 3 other COPD exacerbations in the last year requiring hospitalization including recent d/c from hospital within the last week.    PT Comments    Pt is getting up to walk with PT and noted her issues, worsened by the fact that she has been furniture walking per her report.  The pt is demonstrating losses of balance with BERG skills, indicating that without her family help as originally promised there will be a higher fall risk at home.  Follow Up Recommendations  SNF     Equipment Recommendations  Rolling walker with 5" wheels (after completing in pt therapy)    Recommendations for Other Services Rehab consult     Precautions / Restrictions Precautions Precautions: Fall Precaution Comments: O2 dependent 3L  Restrictions Weight Bearing Restrictions: No    Mobility  Bed Mobility Overal bed mobility: Modified Independent                Transfers Overall transfer level: Needs assistance Equipment used: None Transfers: Sit to/from [facility] Sit to Stand: Min guard Stand pivot transfers: Min guard       General transfer comment: needs to be assisted to avoid LOB  Ambulation/Gait Ambulation/Gait assistance: Min assist Ambulation Distance (Feet): 40 Feet Assistive device: 1 person hand held assist Gait Pattern/deviations: Step-through pattern;Wide base of support;Drifts right/left Gait velocity: decreased Gait velocity interpretation: Below normal speed for age/gender General Gait Details: has used 3L O2 with cannula in room with contact steadying min assist, drifting to sides and stepping unsteadily   Stairs            Wheelchair Mobility    Modified Rankin (Stroke Patients  Only)       Balance Overall balance assessment: Needs assistance Sitting-balance support: Feet supported Sitting balance-Leahy Scale: Good       Standing balance-Leahy Scale: Fair Standing balance comment: fair- dynamic balance                    Cognition Arousal/Alertness: Awake/alert Behavior During Therapy: WFL for tasks assessed/performed Overall Cognitive Status: Within Functional Limits for tasks assessed                      Exercises      General Comments General comments (skin integrity, edema, etc.): Pt is moving well but has clear unsteadiness on her feet, higher  fall rsk with RW and assistance needed, but will be home alone a good part of the day      Pertinent Vitals/Pain Pain Assessment: No/denies pain    Home Living Family/patient expects to be discharged to:: Skilled nursing facility                    Prior Function            PT Goals (current goals can now be found in the care plan section) Progress towards PT goals: Progressing toward goals    Frequency  Min 3X/week    PT Plan Discharge plan needs to be updated    Co-evaluation             End of Session Equipment Utilized During Treatment: Oxygen Activity Tolerance: Patient tolerated treatment well Patient left: in bed;with call bell/phone within  reach (sitting up in bed)     Time:  -     Charges:  $Gait Training: 8-22 mins $Neuromuscular Re-education: 8-22 mins                    G Codes:      Ramond Dial 09-09-2015, 2:19 PM   Mee Hives, PT MS Acute Rehab Dept. Number: [facility] [phone] and [place] [phone]

## 2015-08-29 NOTE — Progress Notes (Signed)
Subjective: [name]  She refused BiPAP overnight.  She states she will be ready for discharge tomorrow when her daughter returns to town.  Objective: Vital signs in last 24 hours: Filed Vitals:   08/29/15 0319 08/29/15 0338 08/29/15 0500 08/29/15 0538  BP:   94/53   Pulse:   89   Temp:   98.7 F (37.1 C)   TempSrc:   Oral   Resp:   18   Height:      Weight:      SpO2: 93% 99% 99% 100%   Weight change:  No intake or output data in the 24 hours ending 08/29/15 0258 Physical Exam  Constitutional: She is oriented to person, place, and time.  Frail, appears older than stated age. No respiratory distress.  Breathing comfortably on 2L [place]  HENT:  Head: Normocephalic and atraumatic.  Eyes: EOM are normal. No scleral icterus.  Neck: No JVD present. No tracheal deviation present.  Cardiovascular: Normal heart sounds and intact distal pulses.   Borderline tachycardic. Regular rhythm.  Pulmonary/Chest:  Decreased air movement throughout. No wheezes appreciated following duoneb. No respiratory distress.  Abdominal: Soft. She exhibits no distension. There is no tenderness. There is no rebound and no guarding.  Musculoskeletal: She exhibits no edema.  No calf swelling or tenderness.  Neurological: She is alert and oriented to person, place, and time.  Skin: Skin is warm and dry. No rash noted.    Lab Results: Basic Metabolic Panel:  Recent Labs Lab 08/27/15 0803 08/28/15 0750  NA 130* 133*  K 4.5 4.6  CL 88* 88*  CO2 34* 39*  GLUCOSE 125* 131*  BUN 21* 13  CREATININE 0.67 0.61  CALCIUM 8.5* 8.7*   Liver Function Tests:  Recent Labs Lab 08/25/15 2116  AST 31  ALT 21  ALKPHOS 67  BILITOT 0.9  PROT 7.1  ALBUMIN 4.1   No results for input(s): LIPASE, AMYLASE in the last 168 hours. No results for input(s): AMMONIA in the last 168 hours. CBC:  Recent Labs Lab 08/25/15 2116  08/26/15 1050 08/27/15 0130  WBC 9.9  --  6.9 11.2*  NEUTROABS 6.5  --   --   --   HGB  15.2*  < > 13.7 12.6  HCT 43.1  < > 40.7 38.0  MCV 93.1  --  94.0 95.7  PLT PLATELET CLUMPS NOTED ON SMEAR, COUNT APPEARS ADEQUATE  --  642* 507*  < > = values in this interval not displayed. Cardiac Enzymes:  Recent Labs Lab 08/26/15 0653 08/26/15 1050 08/26/15 2350  TROPONINI 0.20* 0.15* 0.22*   BNP: No results for input(s): PROBNP in the last 168 hours. D-Dimer: No results for input(s): DDIMER in the last 168 hours. CBG: No results for input(s): GLUCAP in the last 168 hours. Hemoglobin A1C: No results for input(s): HGBA1C in the last 168 hours. Fasting Lipid Panel: No results for input(s): CHOL, HDL, LDLCALC, TRIG, CHOLHDL, LDLDIRECT in the last 168 hours. Thyroid Function Tests: No results for input(s): TSH, T4TOTAL, FREET4, T3FREE, THYROIDAB in the last 168 hours. Coagulation:  Recent Labs Lab 08/26/15 1505  LABPROT 14.6  INR 1.12   Anemia Panel: No results for input(s): VITAMINB12, FOLATE, FERRITIN, TIBC, IRON, RETICCTPCT in the last 168 hours. Urine Drug Screen: Drugs of Abuse  No results found for: LABOPIA, COCAINSCRNUR, LABBENZ, AMPHETMU, THCU, LABBARB  Alcohol Level: No results for input(s): ETH in the last 168 hours. Urinalysis:  Recent Labs Lab 08/25/15 2130  COLORURINE YELLOW  LABSPEC 1.019  PHURINE 6.0  GLUCOSEU NEGATIVE  HGBUR SMALL*  BILIRUBINUR NEGATIVE  KETONESUR 40*  PROTEINUR 30*  UROBILINOGEN 0.2  NITRITE NEGATIVE  LEUKOCYTESUR NEGATIVE   Misc. Labs:   Micro Results: Recent Results (from the past 240 hour(s))  Urine culture     Status: None   Collection Time: 08/25/15  9:30 PM  Result Value Ref Range Status   Specimen Description URINE, CATHETERIZED  Final   Special Requests NONE  Final   Culture NO GROWTH 2 DAYS  Final   Report Status 08/27/2015 FINAL  Final  MRSA PCR Screening     Status: Abnormal   Collection Time: 08/26/15  3:29 AM  Result Value Ref Range Status   MRSA by PCR POSITIVE (A) NEGATIVE Final    Comment:         The GeneXpert MRSA Assay (FDA approved for NASAL specimens only), is one component of a comprehensive MRSA colonization surveillance program. It is not intended to diagnose MRSA infection nor to guide or monitor treatment for MRSA infections. RESULT CALLED TO, READ BACK BY AND VERIFIED WITH: A PETTIFORD '@0530'$  08/26/15 MKELLY    Studies/Results: Ct Angio Chest Pe W/cm &/or Wo Cm  08/27/2015  CLINICAL DATA:  Acute onset of shortness of breath and hypoxia. Initial encounter. EXAM: CT ANGIOGRAPHY CHEST WITH CONTRAST TECHNIQUE: Multidetector CT imaging of the chest was performed using the standard protocol during bolus administration of intravenous contrast. Multiplanar CT image reconstructions and MIPs were obtained to evaluate the vascular anatomy. CONTRAST:  83m OMNIPAQUE IOHEXOL 350 MG/ML SOLN COMPARISON:  Chest radiograph performed 08/25/2015 FINDINGS: There is no evidence of pulmonary embolus. Bilateral emphysematous change is noted. There is opacification of the bronchioles to the left lower lung lobe, with minimal underlying opacity, compatible with aspiration. A 5 mm nodule is noted at the right middle lobe (image 50 of 107). There is no evidence of pleural effusion or pneumothorax. No masses are identified; no abnormal focal contrast enhancement is seen. The mediastinum is unremarkable in appearance. A small hiatal hernia is noted. No pericardial effusion is identified. No mediastinal lymphadenopathy is seen. The great vessels are grossly unremarkable in appearance. No axillary lymphadenopathy is seen. The thyroid gland is unremarkable in appearance. The visualized portions of the liver and spleen are unremarkable. The visualized portions of the pancreas, stomach, adrenal glands and kidneys are within normal limits. No acute osseous abnormalities are seen. Review of the MIP images confirms the above findings. IMPRESSION: 1. No evidence of pulmonary embolus. 2. Opacification of the  bronchioles to the left lower lung lobe, with underlying minimal airspace opacity, compatible with aspiration. 3. Bilateral emphysematous change noted. 4. 5 mm nodule at the right middle lobe. If the patient is at high risk for bronchogenic carcinoma, follow-up chest CT at 6-12 months is recommended. If the patient is at low risk for bronchogenic carcinoma, follow-up chest CT at 12 months is recommended. This recommendation follows the consensus statement: Guidelines for Management of Small Pulmonary Nodules Detected on CT Scans: A Statement from the [place] published in Radiology 2005;237:395-400. 5. Small hiatal hernia noted. Electronically Signed   By: JGarald BaldingM.D.   On: 08/27/2015 22:35   Medications: I have reviewed the patient's current medications. Scheduled Meds: . antiseptic oral rinse  7 mL Mouth Rinse BID  . azithromycin  250 mg Oral Daily  . busPIRone  15 mg Oral BID  . Chlorhexidine Gluconate Cloth  6 each Topical Q0600  . enoxaparin (LOVENOX)  injection  20 mg Subcutaneous Q24H  . escitalopram  40 mg Oral Daily  . feeding supplement (ENSURE ENLIVE)  237 mL Oral TID BM  . ipratropium-albuterol  3 mL Nebulization QID  . mometasone-formoterol  2 puff Inhalation BID  . mupirocin ointment  1 application Nasal BID  . polyethylene glycol  17 g Oral Daily  . predniSONE  40 mg Oral Q breakfast  . sodium chloride  1,000 mL Intravenous Once  . sodium chloride  3 mL Intravenous Q12H   Continuous Infusions:   PRN Meds:.sodium chloride, acetaminophen, albuterol, ALPRAZolam, butalbital-acetaminophen-caffeine, ondansetron (ZOFRAN) IV, sodium chloride Assessment/Plan: Principal Problem:   Acute exacerbation of chronic obstructive pulmonary disease (COPD) ([place]) Active Problems:   Multiple pulmonary nodules determined by computed tomography of lung   Essential hypertension   COPD exacerbation (HCC)   Protein-calorie malnutrition, severe   Anxiety state   Chronic  post-traumatic stress disorder (PTSD)  Megan [name] is a 64 year old woman with a past medical history of COPD (Stage 4D), HTN, PTSD, lung nodules concerning for NSCLC, and anxiety who presents with worsening shortness of breath.  Acute on Chronic Respiratory Failure (COPD Stage 4D): Patient presents with worsening DOE and at rest.  She admits that running out of her Xanax made her dyspnea worse.  She has been taking her Prednisone, Azithromycin, Advair, and Albuterol.  She had not picked up her Incruse. It is possible that not picking up her LAMA may have led to worsening SOB, but it is also possible that her overall respiratory status is continuing to decline, further worsened by her anxiety.  In addition, ECG findings and troponinemia concerning for acute cardiac cause of dyspena.  Cath 10/25 negative for ischemic disease.  CTA negative for PE.  Symptoms now attributed to acute COPD exacerbation and anxiety attack.  Will adjust patient's Buspirone and Escitalopram.  Restart Azithro and continue Prednisone.  She is markedly improved today and ambulating well.  Likely discharge tomorrow.  However, daughter unsure she will be able to take care of her at home and patient may need SNF placement. - Azithromycin 250 mg daily - Duoneb q4h - Prednisone 40 mg - Albuterol  - Dulera BID - BiPAP PRN - PT/OT  ST Elevation and Elevated Troponins: After viewing EKG on 10/18, there appear to be new ST elevations. Patient had previously been in sinus tach without ST changes.  Elevated troponins could be related to demand ischemia vs ACS vs PE.  No signs of PE on ECG, but patient is sinus tach, low SpO2, recently hospitalized, and relatively immobile. Cardiac cath negative for ischemic coronary disease. CTA negative.   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 Concepcion Living from [facility] on 11/1  PTSD/Anxiety: chronic. Last evenings episode seems most consistent with anxiety  attack during manipulation of IV. Attack alleviated with Xanax and Fioricet, both of which are GABAergic. Patient is on max dosing of Lexapro. Will add Buspar for added longer term benefit. - Escitalopram 40 mg daily - Xanax 0.25 mg BID PRN - Buspar 15 mg BID for added anxiety benefit - Ramelteon  Protein Malnutrition: Ensure  DVT Prophylaxis: Lovenox  Code Status: Full  Dispo: Disposition is deferred at this time, awaiting improvement of current medical problems.  Anticipated discharge in approximately 1-2 day(s).   The patient does not have a current PCP (Pcp Not In System) and does not need an [facility] hospital follow-up appointment after discharge.  The patient does not  have transportation limitations that hinder transportation to clinic appointments.  .Services Needed at time of discharge: Y = Yes, Blank = No PT:  HH PT  OT:   RN:  HH RN  Equipment:  Home O2, rolling walker, 3n1  Other:     LOS: 4 days   Iline Oven, MD 08/29/2015, 7:13 AM

## 2015-08-29 NOTE — Discharge Summary (Signed)
Name: Megan [name] MRN: [id number] DOB: February 03, 1951 64 y.o. PCP: Pcp Not In System  Date of Admission: 08/25/2015  8:47 PM Date of Discharge: 08/29/2015 Attending Physician: Axel Filler, MD  Discharge Diagnosis:  COPD Exacerbation  ST Elevation and Elevated Troponins  PTSD and Generalized Anxiety Disorder  Right middle Lobe Pulmonary Nodule   Discharge Medications:   Medication List    TAKE these medications        acetaminophen 500 MG tablet  Commonly known as:  TYLENOL  Take 500 mg by mouth every 6 (six) hours as needed for mild pain.     albuterol 108 (90 BASE) MCG/ACT inhaler  Commonly known as:  PROVENTIL HFA;VENTOLIN HFA  Inhale 1 puff into the lungs every 4 (four) hours as needed for wheezing or shortness of breath.     ALPRAZolam 0.25 MG tablet  Commonly known as:  XANAX  Take 1 tablet (0.25 mg total) by mouth 2 (two) times daily as needed for anxiety.     aspirin 81 MG chewable tablet  Chew 81 mg by mouth daily.     azithromycin 250 MG tablet  Commonly known as:  ZITHROMAX  Take 1 tablet (250 mg total) by mouth daily.     busPIRone 7.5 MG tablet  Commonly known as:  BUSPAR  Take 2 tablets (15 mg total) by mouth 2 (two) times daily.     butalbital-acetaminophen-caffeine 50-325-40 MG tablet  Commonly known as:  FIORICET  Take 1-2 tablets by mouth every 6 (six) hours as needed for headache.     cyproheptadine 4 MG tablet  Commonly known as:  PERIACTIN  Take 4 mg by mouth daily as needed (appetitie stimulant).     escitalopram 20 MG tablet  Commonly known as:  LEXAPRO  Take 2 tablets (40 mg total) by mouth daily.     Fluticasone-Salmeterol 500-50 MCG/DOSE Aepb  Commonly known as:  ADVAIR  Inhale 1 puff into the lungs 2 (two) times daily.     ipratropium-albuterol 0.5-2.5 (3) MG/3ML Soln  Commonly known as:  DUONEB  Take 3 mLs by nebulization every 6 (six) hours as needed (shortness of breath).     lidocaine 5 %  Commonly known as:   LIDODERM  Place 1 patch onto the skin daily as needed (pain). Remove & Discard patch within 12 hours or as directed by MD     lisinopril 2.5 MG tablet  Commonly known as:  PRINIVIL,ZESTRIL  Take 2.5 mg by mouth daily.     Melatonin 3 MG Tabs  Take 1 tablet by mouth at bedtime as needed (sleep).     multivitamin with minerals Tabs tablet  Take 1 tablet by mouth daily.     predniSONE 20 MG tablet  Commonly known as:  DELTASONE  Take 1 tablet (20 mg total) by mouth daily with breakfast.     promethazine 12.5 MG tablet  Commonly known as:  PHENERGAN  Take 12.5 mg by mouth every 6 (six) hours as needed for nausea or vomiting.     Umeclidinium Bromide 62.5 MCG/INH Aepb  Commonly known as:  INCRUSE ELLIPTA  Inhale 1 puff into the lungs daily.        Disposition and follow-up:   Megan [name] was discharged from [facility] in Stable condition.  At the hospital follow up visit please address:  1.  COPD exacerbation - Needs to take azithromycin 200 mg daily, prednisone 20 mg daily,  incruse ellipta, and advair. Has duonebs  PRN.         Generalized Anxiety disorder & PTSD- Her home buspar was increased from 7.5 mg BID to 15 mg BID. Her home lexapro was increased from 20 mg daily to 40 mg daily. She is also on xanax 0.25 mg BID as needed.     Pulmonary nodule with concern for NSCLC - Needs to follow-up with Dr. Concepcion Living with [name] on 11/1 - please  reschedule her appointment after she gets discharged from SNF       Please reschedule [place] clinic appointment after gets discharged from SNF  Needs discussion of health care power of attorney and code status   2.  Labs / imaging needed at time of follow-up: None  3.  Pending labs/ test needing follow-up: None  Follow-up Appointments: Follow-up Information    Follow up with Marshell Garfinkel, MD On 09/02/2015.   Specialty:  Pulmonary Disease   Why:  11:00am    Contact  information:   [address] [place] [address] [phone]       Follow up with Osa Craver, MD On 09/02/2015.   Specialty:  Internal Medicine   Why:  3:15 pm   Contact information:   [place] [address] [phone]       Discharge Instructions:   Consultations: Treatment Team:  [facility]  Cardiology  Procedures Performed:  Dg Chest 2 View  08/18/2015  CLINICAL DATA:  Acute onset of shortness of breath for 1 week. Initial encounter. EXAM: CHEST  2 VIEW COMPARISON:  None. FINDINGS: The lungs are hyperexpanded, with flattening of the hemidiaphragms, compatible with COPD. Bilateral nipple shadows are note. Mild peribronchial thickening is seen. There is no evidence of pleural effusion or pneumothorax. The heart is normal in size; the mediastinal contour is within normal limits. No acute osseous abnormalities are seen. IMPRESSION: Findings of COPD.  No acute cardiopulmonary process identified. Electronically Signed   By: Garald Balding M.D.   On: 08/18/2015 21:19   Ct Angio Chest Pe W/cm &/or Wo Cm  08/27/2015  CLINICAL DATA:  Acute onset of shortness of breath and hypoxia. Initial encounter. EXAM: CT ANGIOGRAPHY CHEST WITH CONTRAST TECHNIQUE: Multidetector CT imaging of the chest was performed using the standard protocol during bolus administration of intravenous contrast. Multiplanar CT image reconstructions and MIPs were obtained to evaluate the vascular anatomy. CONTRAST:  58m OMNIPAQUE IOHEXOL 350 MG/ML SOLN COMPARISON:  Chest radiograph performed 08/25/2015 FINDINGS: There is no evidence of pulmonary embolus. Bilateral emphysematous change is noted. There is opacification of the bronchioles to the left lower lung lobe, with minimal underlying opacity, compatible with aspiration. A 5 mm nodule is noted at the right middle lobe (image 50 of 107). There is no evidence of pleural effusion or pneumothorax. No masses are identified; no  abnormal focal contrast enhancement is seen. The mediastinum is unremarkable in appearance. A small hiatal hernia is noted. No pericardial effusion is identified. No mediastinal lymphadenopathy is seen. The great vessels are grossly unremarkable in appearance. No axillary lymphadenopathy is seen. The thyroid gland is unremarkable in appearance. The visualized portions of the liver and spleen are unremarkable. The visualized portions of the pancreas, stomach, adrenal glands and kidneys are within normal limits. No acute osseous abnormalities are seen. Review of the MIP images confirms the above findings. IMPRESSION: 1. No evidence of pulmonary embolus. 2. Opacification of the bronchioles to the left lower lung lobe, with underlying minimal airspace opacity,  compatible with aspiration. 3. Bilateral emphysematous change noted. 4. 5 mm nodule at the right middle lobe. If the patient is at high risk for bronchogenic carcinoma, follow-up chest CT at 6-12 months is recommended. If the patient is at low risk for bronchogenic carcinoma, follow-up chest CT at 12 months is recommended. This recommendation follows the consensus statement: Guidelines for Management of Small Pulmonary Nodules Detected on CT Scans: A Statement from the [employer] as published in Radiology 2005;237:395-400. 5. Small hiatal hernia noted. Electronically Signed   By: Garald Balding M.D.   On: 08/27/2015 22:35   Dg Chest Portable 1 View  08/25/2015  CLINICAL DATA:  Acute onset of shortness of breath. Initial encounter. EXAM: PORTABLE CHEST 1 VIEW COMPARISON:  Chest radiograph performed 08/18/2015 FINDINGS: The lungs are well-aerated and clear. There is no evidence of focal opacification, pleural effusion or pneumothorax. Bilateral nipple shadows are seen. The cardiomediastinal silhouette is within normal limits. No acute osseous abnormalities are seen. IMPRESSION: No acute cardiopulmonary process seen. Electronically Signed   By: Garald Balding M.D.   On: 08/25/2015 21:16    2D Echo:   Cardiac Cath:  Dominance: Right   Left Anterior Descending  The vessel is tortuous. 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 There is hyperdynamic left ventricular systolic function. The left ventricular ejection fraction is greater tha 65% by visual estimate. There are no wall motion abnormalities in the left ventricle.   Aortic Valve There is no aortic valve stenosis.   Hemo Data       Most Recent Value   AO Systolic Pressure  300 mmHg   AO Diastolic Pressure  63 mmHg   AO Mean  84 mmHg   LV Systolic Pressure  923 mmHg   LV Diastolic Pressure  2 mmHg   LV EDP  5 mmHg   Arterial Occlusion Pressure Extended Systolic Pressure  300 mmHg   Arterial Occlusion Pressure Extended Diastolic Pressure  63 mmHg   Arterial Occlusion Pressure Extended Mean Pressure  84 mmHg   Left Ventricular Apex Extended Systolic Pressure  762 mmHg   Left Ventricular Apex Extended Diastolic Pressure  2 mmHg   Left Ventricular Apex Extended EDP Pressure  7 mmHg     Admission HPI: Ms. Rape is a 64 year old woman with a past medical history of COPD (Stage 4D), HTN, PTSD, lung nodules concerning for NSCLC, and anxiety who presents with worsening shortness of breath. She was recently admitted for the same problem (10/18 - 10), during which her COPD regimen was adjusted to full dose Advair, scheduled Incruse Ellipta, and daily Azithromycin. She was also discharged on 40 mg of prednisone daily, decreasing to 20 mg daily on 10/26. with tapering to be scheduled at her [facility] follow-up appointment on 11/1, and a follow-up appointment with Dr. Vaughan Browner on 11/1 for workup of her possibly malignancy pulmonary nodules. Since discharge, she has been unable to pick up her Ellipta Incruse, but she has been adherent with all her other medications. Her dyspnea has also progressively  worsened, associated with wheezing, and has continued required up to 2.5L home O2. She endorses chills, sore throat, sick contacts, productive cough (green sputum), anxiety, nausea, headaches. She denied fever, blackouts, vomiting, diarrhea, constipation, skin changes, or vision changes. This is her fifth hospitalization for COPD exacerbation this year. When we discussed with Ms. [place] what her goals  were, she said she would like to be stronger so she could be around her family. She did not want to discuss Code Status at this time. She is a former smoker, former drinker.  In the ED, she was noted to have ST elevations in V3-V4 without depressions in the reciprocal leads. A Troponin I was 0.22. She was started on IV heparin 500U/hr and nitroglycerine infusion. Chest X-ray did not demonstrate any acute changes.   Hospital Course by problem list:   COPD Exacerbation: Patient presented with acute onset dyspnea, contributed to by lack of home Xanax for anxiety.  Her breathing improved with BiPAP on admission.  Her initial lab work was also concerning for slight Troponinemia to 0.22.  Due to concern for a cardiac cause of her acute respiratory failure, she was started on heparin IV.  Her cardiac cath findings (as above) demonstrated no acute coronary ischemia.  CTA was performed and no pulmonary emboli were found.  Patient's dyspnea improved daily, as well as her physical exam.  Her symptoms are felt to be due to acute COPD exacerbation, accentuated by anxiety. She was continued on her home medications, which were refilled on her last admission, including Prednisone 20 mg daily and Azithromycin 250 mg daily. She was instructed to continue these medications on discharge in addition to her daily inhalers incruse ellipta and advair. She was scheduled follow-up with [facility] on 09/02/15.  ST Elevation and Elevated Troponins - Pt had EKG changes of new ST elevations and elevated troponins  on admission thought to be due to demand ischemia from COPD exacerbation. Cardiac catherization on 08/26/15 was negative for ischemic coronary disease with EF of 55-60%. CTA chest was negative for PE. Pt was continued on aspirin 81 mg daily during hospitalization. No further cardiac follow-up was recommended.   PTSD and Generalized Anxiety Disorder - Pt with uncontrolled anxiety during hospitalization requiring increase in her home dose of buspar from 7.5 mg BID to 15 mg BID and lexapro from 20 mg daily to 40 mg daily with improvement of her anxiety. Pt instructed to continue these adjusted medications.  Pt also on xanax 0.25 mg BID as needed.   Right middle Lobe Pulmonary Nodule - Pt with evidence of 5 mm right middle lobe pulmonary nodule on CTA chest on 08/27/15 concerning for NSCLC. Pt needs to follow-up with Dr. Concepcion Living from [facility] which was scheduled for 09/02/15.   Discharge Vitals:   BP 126/61 mmHg  Pulse 118  Temp(Src) 99.4 F (37.4 C) (Oral)  Resp 16  Ht 5' 2.5" (1.588 m)  Wt 87 lb 15.4 oz (39.9 kg)  BMI 15.82 kg/m2  SpO2 93%  Discharge Labs:  No results found for this or any previous visit (from the past 24 hour(s)).  Signed: Iline Oven, MD 08/29/2015, 2:15 PM    Services Ordered on Discharge: SNF Equipment Ordered on Discharge: 3n1, rolling walker

## 2015-08-29 NOTE — Clinical Social Work Placement (Signed)
   CLINICAL SOCIAL WORK PLACEMENT  NOTE  Date:  08/29/2015  Patient Details  Name: Megan [name] MRN: [id number] Date of Birth: 1951-07-23  Clinical Social Work is seeking post-discharge placement for this patient at the [place] level of care (*CSW will initial, date and re-position this form in  chart as items are completed):  Yes   Patient/family provided with [facility] Work Department's list of facilities offering this level of care within the geographic area requested by the patient (or if unable, by the patient's family).  Yes   Patient/family informed of their freedom to choose among providers that offer the needed level of care, that participate in Medicare, Medicaid or managed care program needed by the patient, have an available bed and are willing to accept the patient.  Yes   Patient/family informed of [facility]'s ownership interest in [facility] and [facility], as well as of the fact that they are under no obligation to receive care at these facilities.  PASRR submitted to EDS on 08/29/15     PASRR number received on 08/29/15     Existing PASRR number confirmed on       FL2 transmitted to all facilities in geographic area requested by pt/family on       FL2 transmitted to all facilities within larger geographic area on       Patient informed that his/her managed care company has contracts with or will negotiate with certain facilities, including the following:        Yes   Patient/family informed of bed offers received.  Patient chooses bed at [facility] recommends and patient chooses bed at      Patient to be transferred to [facility] on 08/29/15.  Patient to be transferred to facility by Ambulance     Patient family notified on 08/29/15 of transfer.  Name of family member notified:  Patient has notified daughter     PHYSICIAN       Additional Comment:  Per MD  patient ready for DC to [facility]. RN, patient, patient's family, and facility notified of DC. RN given number for report. DC packet on chart. Ambulance transport requested for patient. CSW signing off.   _______________________________________________ Liz Beach MSW, [place], [place], [phone]

## 2015-08-29 NOTE — Care Management Note (Addendum)
Case Management Note  Patient Details  Name: Megan [name] MRN: [id number] Date of Birth: 1951/10/22  Subjective/Objective:Note initiated from [place]: Admitted with Respiratory failure/COPD exacerbation,CP. Pt with hx of frequent hospital admissions, COPD (Stage 4D), HTN, PTSD( manifested three years ago after witnessing the suicide of her son) , lung nodules, anxiety, home 02. S/P cardiac cath 08/26/15 showing no significant CAD.   Action/Plan: Return to home when medically stable. CM to f/u with d/c needs   Expected Discharge Date:                  Expected Discharge Plan:  [place]  In-House Referral:     Discharge planning Services  CM Consult  Post Acute Care Choice:  Durable Medical Equipment Choice offered to:     DME Arranged:  [facility], Walker rolling DME Agency:  [facility]  HH Arranged:  PT, RN [facility] Agency:  [facility]  Status of Service:Completed.  Medicare Important Message Given:    Date Medicare IM Given:    Medicare IM give by:    Date Additional Medicare IM Given:    Additional Medicare Important Message give by:     If discussed at [place] of Stay Meetings, dates discussed:    Additional Comments: 1341 08-29-15 Jacqlyn Krauss, RN,BSN [phone] CM did speak with MD in regards to disposition needs. Plan was for home care with [facility], However pt's daughter will not be in town as scheduled. Pt will not be be discharged home without any support. PT was called back to reevaluate for SNF. CM awaiting recommendations from PT. If SNF appropriate pt will be offered first available for SNF. No further needs from CM at this time.   Bethena Roys, RN 08/29/2015, 1:40 PM

## 2015-08-29 NOTE — Progress Notes (Signed)
Daily Progress Note   Patient Name: Megan [name]       Date: 08/29/2015 DOB: Mar 13, 1951  Age: 64 y.o. MRN#: [id number] Attending Physician: Axel Filler, MD Primary Care Physician: Pcp Not In System Admit Date: 08/25/2015  Reason for Consultation/Follow-up: Establishing goals of care  Subjective: Megan [name] is a 64 year old female with past medical history of stage IV D COPD, hypertension, PTSD, lung nodules concerning for NSCLC, and anxiety admitted with worsening shortness of breath. Palliative was consulted for goals of care in light of acute worsening of her chronic progressive lung disease.  Interval Events: Dr. Heber [place] and I met today with Megan [name]. Her affect appears much brighter today and more she is more engaging in conversation. She reports that she is feeling much better than admission.  We had a long conversation about the emotional turmoil she has been suffering due to multiple traumatic events over the last several years. This included continued worsening of her chronic medical problems which has limited her activities. Further complications include breakup with long-term significant other followed by witnessing the death of her son by suicide approximately 3 years ago. Additionally, she has 3 grandchildren who have a genetic disorder and require high levels of care and frequent medical visits.  She reports that since that time she is bottle up her emotions and does not really have any supportive people that she can talk to. She has a good relationship with her son and daughter, but does not really discuss her emotional well-being with them in order to try and protect them.  She states that she knows that she is sick and that this will continue to be the case. She reports being overwhelmed during her admission and hospitalization with questions about her wishes regarding intubation. She states understanding that the opinion of the doctors is that she most likely would  never be able to be weaned from ventilator. She also reports that she has been told this in the past and has been on a ventilator and successfully wean 4-5 times.  Her wishes moving forward are to continue with any measures that would potentially prolong her life.  I tried to focus decision making with the intent of continuing medical interventions that are likely to put her on a path toward her goal of remaining independent, spending time with family, feeling as well as possible, and being out of the hospital. I attempted to discuss how performing aggressive measures at the end-of-life would not likely lead to her becoming well enough to leave the hospital and enjoy her current quality of life and level of independence. She was not receptive to this conversation.  She reports that she is worried about placing medical decisions in the hands of her children and burdening them with this. We talked about advanced directives as well as healthcare power of attorney. She reports that she will think about this but is not sure whom she would a point in this position as she has a "very small circle of people I can rely on". She understands that currently decision making would fall to her children issue not able to speak for herself. We also talked about completion of a most form in order to outline what her wishes would be regarding her care moving forward. She was agreeable with this and most form was completed.  Length of Stay: 4 days  Current Medications: Scheduled Meds:  . antiseptic oral rinse  7 mL Mouth Rinse BID  . azithromycin  250 mg  Oral Daily  . busPIRone  15 mg Oral BID  . Chlorhexidine Gluconate Cloth  6 each Topical Q0600  . enoxaparin (LOVENOX) injection  20 mg Subcutaneous Q24H  . escitalopram  40 mg Oral Daily  . feeding supplement (ENSURE ENLIVE)  237 mL Oral TID BM  . ipratropium-albuterol  3 mL Nebulization QID  . mometasone-formoterol  2 puff Inhalation BID  . mupirocin ointment  1  application Nasal BID  . polyethylene glycol  17 g Oral Daily  . predniSONE  40 mg Oral Q breakfast  . sodium chloride  1,000 mL Intravenous Once  . sodium chloride  3 mL Intravenous Q12H    Continuous Infusions:    PRN Meds: sodium chloride, acetaminophen, albuterol, ALPRAZolam, butalbital-acetaminophen-caffeine, ondansetron (ZOFRAN) IV, sodium chloride  Palliative Performance Scale: 70%     Vital Signs: BP 94/53 mmHg  Pulse 89  Temp(Src) 98.7 F (37.1 C) (Oral)  Resp 18  Ht 5' 2.5" (1.588 m)  Wt 39.9 kg (87 lb 15.4 oz)  BMI 15.82 kg/m2  SpO2 95% SpO2: SpO2: 95 % O2 Device: O2 Device: Nasal Cannula O2 Flow Rate: O2 Flow Rate (L/min): 3 L/min  Intake/output summary: No intake or output data in the 24 hours ending 08/29/15 1248 LBM:   Baseline Weight: Weight: 43.5 kg (95 lb 14.4 oz) Most recent weight: Weight: 39.9 kg (87 lb 15.4 oz)  Physical Exam: Frail, appears older than stated age. Occasional pursed lips breathing, otherwise no significant respiratory distress.  HENT:  Head: Normocephalic and atraumatic.  Eyes: EOM are normal. No scleral icterus.  Neck: No JVD present. No tracheal deviation present.  Cardiovascular: Tachycardic, no murmur  Pulmonary/Chest:  Decreased air movement throughout. No significant wheezing.  Abdominal: Soft. She exhibits no distension. There is no tenderness. There is no rebound and no guarding.  Musculoskeletal: She exhibits no edema.  Neurological: She is alert and oriented to person, place            Additional Data Reviewed: Recent Labs     08/27/15  0130  08/27/15  0803  08/28/15  0750  WBC  11.2*   --    --   HGB  12.6   --    --   PLT  507*   --    --   NA   --   130*  133*  BUN   --   21*  13  CREATININE   --   0.67  0.61     Problem List:  Patient Active Problem List   Diagnosis Date Noted  . Acute exacerbation of chronic obstructive pulmonary disease (COPD) ([place]) 08/26/2015  . Anxiety state 08/20/2015    . Chronic post-traumatic stress disorder (PTSD) 08/20/2015  . Multiple pulmonary nodules determined by computed tomography of lung 08/19/2015  . Essential hypertension 08/19/2015  . COPD exacerbation ([place]) 08/19/2015  . Protein-calorie malnutrition, severe 08/19/2015     Palliative Care Assessment & Plan    Code Status:  Full code  Goals of Care: We completed MOST form today. Attempt resuscitation, Full Scope of Treatment, IVF and ABX if indicated, Time limited trial of feeding tube. She noted that multiple doctors have expressed the concern that if she were to be intubated she would never be able to be successfully extubated again.  She reports that she "is tough"and surprises everybody when it comes to her ability to recover from medical problems. We then talked about what possible pathways forward from not being able to be  weaned from a ventilator may look like including liberation from vent and comfort based approach versus trach and peg with likely placement at [facility].  She reports that she would need to consider this prior to making any decisions or updating her MOST form, but she believes that her wish would not be to pursue trach and PEG if she faced this situation.   I told her that our discussion today should be a continued conversation with her physicians when she leaves the hospital and also that she would need to reassess the decisions we discussed today as her clinical condition changes (especially in light of the fact that she has lung nodules concerning for lung cancer). She reports she does not want to place burden of decision making on her children and therefore wants to complete a healthcare power of attorney naming somebody else when she determines who will be the best person to serve in this capacity. I still not sure how much Megan [name] is been able to emotionally process the advanced state of her illness. She seems to be able to verbalize that she has a chronic illness  but is unable to really discuss with any meaning what this means to her and her future moving forward. I encouraged her to continue this conversation with her care providers moving forward.  Psycho-social/Spiritual:  Desire for further Chaplaincy support:no   Prognosis: Unable to determine due to acute illness Discharge Planning: Home   Care plan was discussed with patient  Thank you for allowing the Palliative Medicine Team to assist in the care of this patient.   Time In: 1200 Time Out: 1245 Total Time 45 Prolonged Time Billed  no     Greater than 50%  of this time was spent counseling and coordinating care related to the above assessment and plan.   Micheline Rough, MD  08/29/2015, 12:48 PM  Please contact Palliative Medicine Team phone at [phone] for questions and concerns.

## 2015-08-29 NOTE — Clinical Social Work Note (Signed)
Clinical Social Work Assessment  Patient Details  Name: Megan [name] MRN: [id number] Date of Birth: 08-02-1951  Date of referral:  08/29/15               Reason for consult:  Discharge Planning, Facility Placement                Permission sought to share information with:  Facility Art therapist granted to share information::  Yes, Verbal Permission Granted  Name::        Agency::  SNFs  Relationship::     Contact Information:     Housing/Transportation Living arrangements for the past 2 months:  Single Family Home Source of Information:  Patient Patient Interpreter Needed:  None Criminal Activity/Legal Involvement Pertinent to Current Situation/Hospitalization:  No - Comment as needed Significant Relationships:  Adult Children Lives with:  Adult Children Do you feel safe going back to the place where you live?  Yes Need for family participation in patient care:  Yes (Comment)  Care giving concerns:  Patient voices concern about going home today. She states she feels like she needs to go to a facility at discharge, but is adamant about not leaving today.   Social Worker assessment / plan:  CSW met with patient at bedside to complete assessment. Patient appears somewhat anxious about discharging today. CSW explained that the patient is medically stable for DC per MD and RNCM. The patient insists that she was not told that she was ready for DC and asks for an additional night in the hospital. CSW explained that she will be discharged once the patient is medically stable. Patient is agreeable to SNF placement but continues to say she won't go today. CSW explained SNF search/placement process and answered the patient's questions. CSW will followup with bed offers.  Employment status:  Disabled (Comment on whether or not currently receiving Disability) Insurance information:  Medicare PT Recommendations:  [place] / Referral to community  resources:  [place]  Patient/Family's Response to care:  Patient appears happy with the care she has received but states that she doesn't think that she should DC today.  Patient/Family's Understanding of and Emotional Response to Diagnosis, Current Treatment, and Prognosis:  Patient appears to have good understanding of reason for admission. Patient understands what her post DC needs will be.   Emotional Assessment Appearance:  Appears older than stated age Attitude/Demeanor/Rapport:  Other (Patient was appropriate) Affect (typically observed):  Accepting, Appropriate Orientation:  Oriented to Self, Oriented to Place, Oriented to  Time, Oriented to Situation Alcohol / Substance use:  Tobacco Use (Former smoker) Psych involvement (Current and /or in the community):  No (Comment)  Discharge Needs  Concerns to be addressed:  Discharge Planning Concerns Readmission within the last 30 days:  Yes Current discharge risk:  Chronically ill, Physical Impairment Barriers to Discharge:  No Barriers Identified  Liz Beach MSW, [place], [place], [phone]

## 2015-08-29 NOTE — Progress Notes (Signed)
Attempted report to [facility] at [phone].  RN not answering phone.  Gerald Stabs took my name and number and would pass my message to RN. Transport to pick up pt soon (first available), per SW.

## 2015-08-29 NOTE — Progress Notes (Signed)
Recent cath result and CTA of chest result noted, no further cardiac workup planned, cardiology signing off. Please call if has any questions or concerns. Thank you for consulting [place].   Hilbert Corrigan PA Pager: [phone]

## 2015-08-29 NOTE — Clinical Documentation Improvement (Signed)
Internal Medicine  (please document your query response in the progress notes and discharge summary, not on the query form itself.)  Please document the clinical significance, if any, regarding the CT of Chest report dated 08/27/15 regarding aspiration.  Clinical Information: 1. No evidence of pulmonary embolus. 2. Opacification of the bronchioles to the left lower lung lobe, with underlying minimal airspace opacity, compatible with aspiration. 3. Bilateral emphysematous change noted. 4. 5 mm nodule at the right middle lobe. If the patient is at high risk for bronchogenic carcinoma, follow-up chest CT at 6-12 months is recommended. If the patient is at low risk for bronchogenic carcinoma, follow-up chest CT at 12 months is recommended  Please exercise your independent, professional judgment when responding. A specific answer is not anticipated or expected.   Thank You,  Erling Conte  RN BSN CCDS [phone] Health Information Management [place]

## 2015-08-29 NOTE — NC FL2 (Signed)
[place] LEVEL OF CARE SCREENING TOOL     IDENTIFICATION  Patient Name: Megan [name] Birthdate: November 19, 1950 Sex: female Admission Date (Current Location): 08/25/2015  [facility] and [place] Number: Herbalist and Address:  [facility] [place], [place] [address] [place],  [address]      Provider Number: [id number]  Attending Physician Name and Address:  Axel Filler, MD  Relative Name and Phone Number:       Current Level of Care: Hospital Recommended Level of Care: [place] Prior Approval Number:    Date Approved/Denied:   PASRR Number: [id number]  Discharge Plan: SNF    Current Diagnoses: Patient Active Problem List   Diagnosis Date Noted  . Acute exacerbation of chronic obstructive pulmonary disease (COPD) ([place]) 08/26/2015  . Anxiety state 08/20/2015  . Chronic post-traumatic stress disorder (PTSD) 08/20/2015  . Multiple pulmonary nodules determined by computed tomography of lung 08/19/2015  . Essential hypertension 08/19/2015  . COPD exacerbation ([place]) 08/19/2015  . Protein-calorie malnutrition, severe 08/19/2015    Orientation ACTIVITIES/SOCIAL BLADDER RESPIRATION    Self, Time, Situation, Place  Active Continent O2 (As needed) (3L)  BEHAVIORAL SYMPTOMS/MOOD NEUROLOGICAL BOWEL NUTRITION STATUS      Continent Diet  PHYSICIAN VISITS COMMUNICATION OF NEEDS Height & Weight Skin    Verbally '5\' 2"'$  (157.5 cm) 87 lbs. Normal          AMBULATORY STATUS RESPIRATION    Supervision limited O2 (As needed) (3L)      Personal Care Assistance Level of Assistance  Bathing, Dressing Bathing Assistance: Limited assistance   Dressing Assistance: Limited assistance      Functional Limitations [place]  PT (By licensed PT), OT (By licensed OT)     PT Frequency: 5X/week OT Frequency: 5X/week           Additional Factors Info  Code Status Code  Status Info: Full Code             Current Medications (08/29/2015): Current Facility-Administered Medications  Medication Dose Route Frequency Provider Last Rate Last Dose  . 0.9 %  sodium chloride infusion  250 mL Intravenous PRN Jettie Booze, MD 10 mL/hr at 08/27/15 0700 250 mL at 08/27/15 0700  . acetaminophen (TYLENOL) tablet 650 mg  650 mg Oral Q4H PRN Jettie Booze, MD      . albuterol (PROVENTIL) (2.5 MG/3ML) 0.083% nebulizer solution 2.5 mg  2.5 mg Nebulization Q2H PRN Jones Bales, MD      . ALPRAZolam Duanne Moron) tablet 0.25 mg  0.25 mg Oral BID PRN Iline Oven, MD   0.25 mg at 08/28/15 1420  . antiseptic oral rinse (CPC / CETYLPYRIDINIUM CHLORIDE 0.05%) solution 7 mL  7 mL Mouth Rinse BID Axel Filler, MD   7 mL at 08/29/15 1000  . azithromycin (ZITHROMAX) tablet 250 mg  250 mg Oral Daily Norval Gable, MD   250 mg at 08/29/15 1019  . busPIRone (BUSPAR) tablet 15 mg  15 mg Oral BID Norval Gable, MD   15 mg at 08/29/15 1019  . butalbital-acetaminophen-caffeine (FIORICET, ESGIC) 50-325-40 MG per tablet 1 tablet  1 tablet Oral BID PRN Iline Oven, MD   1 tablet at 08/29/15 1022  . Chlorhexidine Gluconate Cloth 2 % PADS 6 each  6 each Topical Q0600 Axel Filler, MD  6 each at 08/28/15 0546  . enoxaparin (LOVENOX) injection 20 mg  20 mg Subcutaneous Q24H Laren Everts, RPH   20 mg at 08/29/15 0857  . escitalopram (LEXAPRO) tablet 40 mg  40 mg Oral Daily Norval Gable, MD   40 mg at 08/29/15 1019  . feeding supplement (ENSURE ENLIVE) (ENSURE ENLIVE) liquid 237 mL  237 mL Oral TID BM Satira Anis Ward, RD   237 mL at 08/29/15 1400  . ipratropium-albuterol (DUONEB) 0.5-2.5 (3) MG/3ML nebulizer solution 3 mL  3 mL Nebulization QID Axel Filler, MD   3 mL at 08/29/15 1200  . mometasone-formoterol (DULERA) 200-5 MCG/ACT inhaler 2 puff  2 puff Inhalation BID Norval Gable, MD   2 puff at 08/29/15 1159  . mupirocin ointment  (BACTROBAN) 2 % 1 application  1 application Nasal BID Axel Filler, MD   1 application at 16/10/96 1020  . ondansetron (ZOFRAN) injection 4 mg  4 mg Intravenous Q6H PRN Jettie Booze, MD   4 mg at 08/28/15 1743  . polyethylene glycol (MIRALAX / GLYCOLAX) packet 17 g  17 g Oral Daily Liberty Handy, MD   17 g at 08/29/15 1020  . predniSONE (DELTASONE) tablet 40 mg  40 mg Oral Q breakfast Axel Filler, MD   40 mg at 08/29/15 0857  . sodium chloride 0.9 % bolus 1,000 mL  1,000 mL Intravenous Once Norval Gable, MD      . sodium chloride 0.9 % injection 3 mL  3 mL Intravenous Q12H Jettie Booze, MD   3 mL at 08/29/15 1000  . sodium chloride 0.9 % injection 3 mL  3 mL Intravenous PRN Jettie Booze, MD       Do not use this list as official medication orders. Please verify with discharge summary.  Discharge Medications:   Medication List    ASK your doctor about these medications        acetaminophen 500 MG tablet  Commonly known as:  TYLENOL  Take 500 mg by mouth every 6 (six) hours as needed for mild pain.     albuterol 108 (90 BASE) MCG/ACT inhaler  Commonly known as:  PROVENTIL HFA;VENTOLIN HFA  Inhale 1 puff into the lungs every 4 (four) hours as needed for wheezing or shortness of breath.     ALPRAZolam 0.25 MG tablet  Commonly known as:  XANAX  Take 0.25 mg by mouth 2 (two) times daily as needed for anxiety.     aspirin 81 MG chewable tablet  Chew 81 mg by mouth daily.     busPIRone 7.5 MG tablet  Commonly known as:  BUSPAR  Take 1 tablet (7.5 mg total) by mouth 2 (two) times daily.     butalbital-acetaminophen-caffeine 50-325-40 MG tablet  Commonly known as:  FIORICET  Take 1-2 tablets by mouth every 6 (six) hours as needed for headache.     cyproheptadine 4 MG tablet  Commonly known as:  PERIACTIN  Take 4 mg by mouth daily as needed (appetitie stimulant).     escitalopram 20 MG tablet  Commonly known as:  LEXAPRO  Take 20 mg by  mouth daily.     Fluticasone-Salmeterol 500-50 MCG/DOSE Aepb  Commonly known as:  ADVAIR  Inhale 1 puff into the lungs 2 (two) times daily.     ipratropium-albuterol 0.5-2.5 (3) MG/3ML Soln  Commonly known as:  DUONEB  Take 3 mLs by nebulization every 6 (six) hours as needed (shortness of  breath).     lidocaine 5 %  Commonly known as:  LIDODERM  Place 1 patch onto the skin daily as needed (pain). Remove & Discard patch within 12 hours or as directed by MD     lisinopril 2.5 MG tablet  Commonly known as:  PRINIVIL,ZESTRIL  Take 2.5 mg by mouth daily.     Melatonin 3 MG Tabs  Take 1 tablet by mouth at bedtime as needed (sleep).     multivitamin with minerals Tabs tablet  Take 1 tablet by mouth daily.     predniSONE 20 MG tablet  Commonly known as:  DELTASONE  10/22-10/25: Take (40 mg) 2 tablets daily; 10/26: take (20 mg) 1 tablet daily until followup     promethazine 12.5 MG tablet  Commonly known as:  PHENERGAN  Take 12.5 mg by mouth every 6 (six) hours as needed for nausea or vomiting.     Umeclidinium Bromide 62.5 MCG/INH Aepb  Commonly known as:  INCRUSE ELLIPTA  Inhale 1 puff into the lungs daily.        Relevant Imaging Results:  Relevant Lab Results:  Recent Labs    Additional Information Patient on contact isolation for MRSA.  Rigoberto Noel, LCSW

## 2015-09-01 ENCOUNTER — Encounter: Payer: Self-pay | Admitting: Internal Medicine

## 2015-09-01 ENCOUNTER — Non-Acute Institutional Stay (SKILLED_NURSING_FACILITY): Payer: Medicare Other | Admitting: Internal Medicine

## 2015-09-01 DIAGNOSIS — K219 Gastro-esophageal reflux disease without esophagitis: Secondary | ICD-10-CM | POA: Diagnosis not present

## 2015-09-01 DIAGNOSIS — F4312 Post-traumatic stress disorder, chronic: Secondary | ICD-10-CM | POA: Diagnosis not present

## 2015-09-01 DIAGNOSIS — F411 Generalized anxiety disorder: Secondary | ICD-10-CM | POA: Diagnosis not present

## 2015-09-01 DIAGNOSIS — E43 Unspecified severe protein-calorie malnutrition: Secondary | ICD-10-CM | POA: Diagnosis not present

## 2015-09-01 DIAGNOSIS — J439 Emphysema, unspecified: Secondary | ICD-10-CM | POA: Diagnosis not present

## 2015-09-01 DIAGNOSIS — I10 Essential (primary) hypertension: Secondary | ICD-10-CM

## 2015-09-01 DIAGNOSIS — R918 Other nonspecific abnormal finding of lung field: Secondary | ICD-10-CM | POA: Diagnosis not present

## 2015-09-01 NOTE — Progress Notes (Signed)
Patient ID: Megan [name], female   DOB: 18-Feb-1951, 64 y.o.   MRN: [id number]    HISTORY AND PHYSICAL   DATE: 09/01/15  Location:  [facility]    Place of Service: SNF (31)   Extended Emergency Contact Information Primary Emergency Contact: Arther Dames [place] Phone: [phone] Relation: Daughter Secondary Emergency Contact: Terrence Dupont [place] of [place] Mobile Phone: [phone] Relation: Son  Advanced Directive information  FULL CODE; MOST FORM ON CHART  Chief Complaint  Patient presents with  . New Admit To SNF    HPI:  64 yo female seen today as a new admission into SNF following hospital stay COPD exacerbation with acute respiratory failure, [name] dependent, RML pulm nodule, ST elevation with elevated Trp, HTN. Megan [name] had CT angio that was neg for PE but revealed pulmonary nodules, one of which is concerning for NSCLC. 2D echo revealed EF 55-60%. Cardiac cath neg for ischemic changes and it was thought that elevated Trp due to demand ischemia. Megan [name] was d/c'd on azithromycin.  Megan [name] c/o SOB today. Megan [name] has an appt with pulm in AM. No CP but has abdominal pain. No N/V. No f/c.  PTSD/anxiety - stable on buspar, lexapro, xanax  HTN/hx MI - stable on ASA, lisinopril  GERD/histal hernia - takes prn phenergan  COPD - Megan [name] takes advair, prednisone, ellipta, [place] [name] at 3L/min, duonebs  Joint pain - stable on lidoderm patch  HA - takes prn fioricet  Past Medical History  Diagnosis Date  . Myocardial infarction ([place])   . CHF (congestive heart failure) ([place])   . COPD (chronic obstructive pulmonary disease) ([place])   . Shortness of breath dyspnea   . Depression   . GERD (gastroesophageal reflux disease)   . History of hiatal hernia   . Headache   . Anemia   . Essential hypertension 08/19/2015  . Anxiety state 08/20/2015    Past Surgical History  Procedure Laterality Date  . Cardiac catheterization    . Cardiac catheterization  N/A 08/26/2015    Procedure: Left Heart Cath and Coronary Angiography;  Surgeon: Jettie Booze, MD;  Location: [facility];  Service: Cardiovascular;  Laterality: N/A;    Patient Care Team: Pcp Not In System as PCP - General  Social History   Social History  . Marital Status: Widowed    Spouse Name: N/A  . Number of Children: N/A  . Years of Education: N/A   Occupational History  . Not on file.   Social History Main Topics  . Smoking status: Former Smoker    Quit date: 08/18/2000  . Smokeless tobacco: Never Used  . Alcohol Use: No  . Drug Use: No  . Sexual Activity: No   Other Topics Concern  . Not on file   Social History Narrative     reports that Megan [name] quit smoking about 15 years ago. Megan [name] has never used smokeless tobacco. Megan [name] reports that Megan [name] does not drink alcohol or use illicit drugs.  No family history on file. No family status information on file.     There is no immunization history on file for this patient.  Allergies  Allergen Reactions  . Penicillins Rash    Has patient had a PCN reaction causing immediate rash, facial/tongue/throat swelling, SOB or lightheadedness with hypotension: {Yes Has patient had a PCN reaction causing severe rash involving mucus membranes or skin necrosis:NO Has patient had a PCN reaction that required hospitalization {Yes Has patient had a  PCN reaction occurring within the last 10 years:NO If all of the above answers are "NO", then may proceed with Cephalosporin use.    Medications: Patient's Medications  New Prescriptions   No medications on file  Previous Medications   ACETAMINOPHEN (TYLENOL) 500 MG TABLET    Take 500 mg by mouth every 6 (six) hours as needed for mild pain.   ALBUTEROL (PROVENTIL HFA;VENTOLIN HFA) 108 (90 BASE) MCG/ACT INHALER    Inhale 1 puff into the lungs every 4 (four) hours as needed for wheezing or shortness of breath.   ALPRAZOLAM (XANAX) 0.25 MG TABLET    Take 1 tablet (0.25 mg total)  by mouth 2 (two) times daily as needed for anxiety.   ASPIRIN 81 MG CHEWABLE TABLET    Chew 81 mg by mouth daily.   AZITHROMYCIN (ZITHROMAX) 250 MG TABLET    Take 1 tablet (250 mg total) by mouth daily.   BUSPIRONE (BUSPAR) 7.5 MG TABLET    Take 2 tablets (15 mg total) by mouth 2 (two) times daily.   BUTALBITAL-ACETAMINOPHEN-CAFFEINE (FIORICET) 50-325-40 MG TABLET    Take 1-2 tablets by mouth every 6 (six) hours as needed for headache.   BUTALBITAL-ACETAMINOPHEN-CAFFEINE (FIORICET, ESGIC) 50-325-40 MG TABLET    Take 1 tablet by mouth 2 (two) times daily as needed for headache.   CYPROHEPTADINE (PERIACTIN) 4 MG TABLET    Take 4 mg by mouth daily as needed (appetitie stimulant).   ESCITALOPRAM (LEXAPRO) 20 MG TABLET    Take 2 tablets (40 mg total) by mouth daily.   FLUTICASONE-SALMETEROL (ADVAIR) 500-50 MCG/DOSE AEPB    Inhale 1 puff into the lungs 2 (two) times daily.   IPRATROPIUM-ALBUTEROL (DUONEB) 0.5-2.5 (3) MG/3ML SOLN    Take 3 mLs by nebulization every 6 (six) hours as needed (shortness of breath).   LIDOCAINE (LIDODERM) 5 %    Place 1 patch onto the skin daily as needed (pain). Remove & Discard patch within 12 hours or as directed by MD   LISINOPRIL (PRINIVIL,ZESTRIL) 2.5 MG TABLET    Take 2.5 mg by mouth daily.   MELATONIN 3 MG TABS    Take 1 tablet by mouth at bedtime as needed (sleep).   MULTIPLE VITAMIN (MULTIVITAMIN WITH MINERALS) TABS TABLET    Take 1 tablet by mouth daily.   PREDNISONE (DELTASONE) 20 MG TABLET    Take 1 tablet (20 mg total) by mouth daily with breakfast.   PROMETHAZINE (PHENERGAN) 12.5 MG TABLET    Take 12.5 mg by mouth every 6 (six) hours as needed for nausea or vomiting.   UMECLIDINIUM BROMIDE (INCRUSE ELLIPTA) 62.5 MCG/INH AEPB    Inhale 1 puff into the lungs daily.  Modified Medications   No medications on file  Discontinued Medications   No medications on file    Review of Systems  Unable to perform ROS: Psychiatric disorder    Filed Vitals:   09/01/15  1601  BP: 118/72  Pulse: 74  Temp: 98.4 F (36.9 C)  SpO2: 98%   There is no weight on file to calculate BMI.  Physical Exam  Constitutional: Megan [name] appears cachectic. Megan [name] is active. Megan [name] has a sickly appearance.  Frail appearing with min conversational dyspnea, sitting on bed. Megan [name] intact  HENT:  Mouth/Throat: Oropharynx is clear and moist. No oropharyngeal exudate.  Eyes: Pupils are equal, round, and reactive to light. No scleral icterus.  Neck: Neck supple. Carotid bruit is not present. No tracheal deviation present. No thyromegaly present.  Cardiovascular: Normal  rate, regular rhythm, normal heart sounds and intact distal pulses.  Exam reveals no gallop and no friction rub.   No murmur heard. No LE edema b/l. no calf TTP.   Pulmonary/Chest: Accessory muscle usage (minimum) present. No stridor. No respiratory distress. Megan [name] has decreased breath sounds (markedly reduced b/l). Megan [name] has wheezes (endexpiratory with prolonged expiratory phase). Megan [name] has no rhonchi. Megan [name] has no rales.  Abdominal: Soft. Bowel sounds are normal. Megan [name] exhibits no distension and no mass. There is no hepatomegaly. There is tenderness (epigastric). There is no rebound and no guarding.  Lymphadenopathy:    Megan [name] has no cervical adenopathy.  Neurological: Megan [name] is alert.  Skin: Skin is warm and dry. No rash noted.  Psychiatric: Megan [name] has a normal mood and affect. Her behavior is normal. Thought content normal.     Labs reviewed: Admission on 08/25/2015, Discharged on 08/29/2015  Component Date Value Ref Range Status  . Sodium 08/25/2015 129* 135 - 145 mmol/L Final  . Potassium 08/25/2015 4.9  3.5 - 5.1 mmol/L Final  . Chloride 08/25/2015 86* 101 - 111 mmol/L Final  . CO2 08/25/2015 26  22 - 32 mmol/L Final  . Glucose, Bld 08/25/2015 110* 65 - 99 mg/dL Final  . BUN 08/25/2015 20  6 - 20 mg/dL Final  . Creatinine, Ser 08/25/2015 0.87  0.44 - 1.00 mg/dL Final  . Calcium 08/25/2015 9.1  8.9 - 10.3 mg/dL Final  . Total  Protein 08/25/2015 7.1  6.5 - 8.1 g/dL Final  . Albumin 08/25/2015 4.1  3.5 - 5.0 g/dL Final  . AST 08/25/2015 31  15 - 41 U/L Final  . ALT 08/25/2015 21  14 - 54 U/L Final  . Alkaline Phosphatase 08/25/2015 67  38 - 126 U/L Final  . Total Bilirubin 08/25/2015 0.9  0.3 - 1.2 mg/dL Final  . GFR calc non Af Amer 08/25/2015 >60  >60 mL/min Final  . GFR calc Af Amer 08/25/2015 >60  >60 mL/min Final   Comment: (NOTE) The eGFR has been calculated using the CKD EPI equation. This calculation has not been validated in all clinical situations. eGFR's persistently <60 mL/min signify possible Chronic Kidney Disease.   . Anion gap 08/25/2015 17* 5 - 15 Final  . Troponin i, poc 08/25/2015 0.19* 0.00 - 0.08 ng/mL Final  . Comment 08/25/2015 NOTIFIED PHYSICIAN   Final  . Comment 3 08/25/2015          Final   Comment: Due to the release kinetics of cTnI, a negative result within the first hours of the onset of symptoms does not rule out myocardial infarction with certainty. If myocardial infarction is still suspected, repeat the test at appropriate intervals.   . WBC 08/25/2015 9.9  4.0 - 10.5 K/uL Final   WHITE COUNT CONFIRMED ON SMEAR  . RBC 08/25/2015 4.63  3.87 - 5.11 MIL/uL Final  . Hemoglobin 08/25/2015 15.2* 12.0 - 15.0 g/dL Final  . HCT 08/25/2015 43.1  36.0 - 46.0 % Final  . MCV 08/25/2015 93.1  78.0 - 100.0 fL Final  . MCH 08/25/2015 32.8  26.0 - 34.0 pg Final  . MCHC 08/25/2015 35.3  30.0 - 36.0 g/dL Final  . RDW 08/25/2015 11.3* 11.5 - 15.5 % Final  . Platelets 08/25/2015 PLATELET CLUMPS NOTED ON SMEAR, COUNT APPEARS ADEQUATE  150 - 400 K/uL Final   SPECIMEN CHECKED FOR CLOTS  . Neutrophils Relative % 08/25/2015 66   Final  . Lymphocytes Relative 08/25/2015 29   Final  .  Monocytes Relative 08/25/2015 5   Final  . Eosinophils Relative 08/25/2015 0   Final  . Basophils Relative 08/25/2015 0   Final  . Neutro Abs 08/25/2015 6.5  1.7 - 7.7 K/uL Final  . Lymphs Abs 08/25/2015 2.9   0.7 - 4.0 K/uL Final  . Monocytes Absolute 08/25/2015 0.5  0.1 - 1.0 K/uL Final  . Eosinophils Absolute 08/25/2015 0.0  0.0 - 0.7 K/uL Final  . Basophils Absolute 08/25/2015 0.0  0.0 - 0.1 K/uL Final  . Smear Review 08/25/2015 MORPHOLOGY UNREMARKABLE   Final  . pH, Arterial 08/25/2015 7.218* 7.350 - 7.450 Final  . pCO2 arterial 08/25/2015 86.2* 35.0 - 45.0 mmHg Final  . pO2, Arterial 08/25/2015 100.0  80.0 - 100.0 mmHg Final  . Bicarbonate 08/25/2015 35.1* 20.0 - 24.0 mEq/L Final  . TCO2 08/25/2015 38  0 - 100 mmol/L Final  . [name] Saturation 08/25/2015 96.0   Final  . Acid-Base Excess 08/25/2015 3.0* 0.0 - 2.0 mmol/L Final  . Patient temperature 08/25/2015 98.6 F   Final  . Collection site 08/25/2015 FEMORAL ARTERY   Final  . Drawn by 08/25/2015 MD   Final  . Sample type 08/25/2015 ARTERIAL   Final  . Comment 08/25/2015 NOTIFIED PHYSICIAN   Final  . Color, Urine 08/25/2015 YELLOW  YELLOW Final  . APPearance 08/25/2015 CLEAR  CLEAR Final  . Specific Gravity, Urine 08/25/2015 1.019  1.005 - 1.030 Final  . pH 08/25/2015 6.0  5.0 - 8.0 Final  . Glucose, UA 08/25/2015 NEGATIVE  NEGATIVE mg/dL Final  . Hgb urine dipstick 08/25/2015 SMALL* NEGATIVE Final  . Bilirubin Urine 08/25/2015 NEGATIVE  NEGATIVE Final  . Ketones, ur 08/25/2015 40* NEGATIVE mg/dL Final  . Protein, ur 08/25/2015 30* NEGATIVE mg/dL Final  . Urobilinogen, UA 08/25/2015 0.2  0.0 - 1.0 mg/dL Final  . Nitrite 08/25/2015 NEGATIVE  NEGATIVE Final  . Leukocytes, UA 08/25/2015 NEGATIVE  NEGATIVE Final  . Specimen Description 08/25/2015 URINE, CATHETERIZED   Final  . Special Requests 08/25/2015 NONE   Final  . Culture 08/25/2015 NO GROWTH 2 DAYS   Final  . Report Status 08/25/2015 08/27/2015 FINAL   Final  . Sodium 08/25/2015 129* 135 - 145 mmol/L Final  . Potassium 08/25/2015 4.7  3.5 - 5.1 mmol/L Final  . Chloride 08/25/2015 89* 101 - 111 mmol/L Final  . BUN 08/25/2015 28* 6 - 20 mg/dL Final  . Creatinine, Ser 08/25/2015  0.90  0.44 - 1.00 mg/dL Final  . Glucose, Bld 08/25/2015 112* 65 - 99 mg/dL Final  . Calcium, Ion 08/25/2015 1.05* 1.13 - 1.30 mmol/L Final  . TCO2 08/25/2015 30  0 - 100 mmol/L Final  . Hemoglobin 08/25/2015 16.7* 12.0 - 15.0 g/dL Final  . HCT 08/25/2015 49.0* 36.0 - 46.0 % Final  . WBC, UA 08/25/2015 0-2  <3 WBC/hpf Final  . RBC / HPF 08/25/2015 3-6  <3 RBC/hpf Final  . Bacteria, UA 08/25/2015 RARE  RARE Final  . Troponin I 08/26/2015 0.22* <0.031 ng/mL Final   Comment:        PERSISTENTLY INCREASED TROPONIN VALUES IN THE RANGE OF 0.04-0.49 ng/mL CAN BE SEEN IN:       -UNSTABLE ANGINA       -CONGESTIVE HEART FAILURE       -MYOCARDITIS       -CHEST TRAUMA       -ARRYHTHMIAS       -LATE PRESENTING MYOCARDIAL INFARCTION       -COPD   CLINICAL  FOLLOW-UP RECOMMENDED.   Marland Kitchen pH, Arterial 08/25/2015 7.315* 7.350 - 7.450 Final  . pCO2 arterial 08/25/2015 64.7* 35.0 - 45.0 mmHg Final  . pO2, Arterial 08/25/2015 74.0* 80.0 - 100.0 mmHg Final  . Bicarbonate 08/25/2015 33.0* 20.0 - 24.0 mEq/L Final  . TCO2 08/25/2015 35  0 - 100 mmol/L Final  . [name] Saturation 08/25/2015 93.0   Final  . Acid-Base Excess 08/25/2015 4.0* 0.0 - 2.0 mmol/L Final  . Patient temperature 08/25/2015 98.6 F   Final  . Collection site 08/25/2015 BRACHIAL ARTERY   Final  . Drawn by 08/25/2015 RT   Final  . Sample type 08/25/2015 ARTERIAL   Final  . Comment 08/25/2015 NOTIFIED PHYSICIAN   Final  . Heparin Unfractionated 08/26/2015 0.10* 0.30 - 0.70 IU/mL Final   Comment:        IF HEPARIN RESULTS ARE BELOW EXPECTED VALUES, AND PATIENT DOSAGE HAS BEEN CONFIRMED, SUGGEST FOLLOW UP TESTING OF ANTITHROMBIN III LEVELS.   . WBC 08/26/2015 6.9  4.0 - 10.5 K/uL Final  . RBC 08/26/2015 4.33  3.87 - 5.11 MIL/uL Final  . Hemoglobin 08/26/2015 13.7  12.0 - 15.0 g/dL Final  . HCT 08/26/2015 40.7  36.0 - 46.0 % Final  . MCV 08/26/2015 94.0  78.0 - 100.0 fL Final  . MCH 08/26/2015 31.6  26.0 - 34.0 pg Final  . MCHC 08/26/2015  33.7  30.0 - 36.0 g/dL Final  . RDW 08/26/2015 11.3* 11.5 - 15.5 % Final  . Platelets 08/26/2015 642* 150 - 400 K/uL Final  . Troponin I 08/26/2015 0.20* <0.031 ng/mL Final   Comment:        PERSISTENTLY INCREASED TROPONIN VALUES IN THE RANGE OF 0.04-0.49 ng/mL CAN BE SEEN IN:       -UNSTABLE ANGINA       -CONGESTIVE HEART FAILURE       -MYOCARDITIS       -CHEST TRAUMA       -ARRYHTHMIAS       -LATE PRESENTING MYOCARDIAL INFARCTION       -COPD   CLINICAL FOLLOW-UP RECOMMENDED.   Marland Kitchen MRSA by PCR 08/26/2015 POSITIVE* NEGATIVE Final   Comment:        The GeneXpert MRSA Assay (FDA approved for NASAL specimens only), is one component of a comprehensive MRSA colonization surveillance program. It is not intended to diagnose MRSA infection nor to guide or monitor treatment for MRSA infections. RESULT CALLED TO, READ BACK BY AND VERIFIED WITH: A PETTIFORD '@0530'  08/26/15 MKELLY   . Sodium 08/26/2015 129* 135 - 145 mmol/L Final  . Potassium 08/26/2015 4.5  3.5 - 5.1 mmol/L Final  . Chloride 08/26/2015 86* 101 - 111 mmol/L Final  . CO2 08/26/2015 31  22 - 32 mmol/L Final  . Glucose, Bld 08/26/2015 139* 65 - 99 mg/dL Final  . BUN 08/26/2015 28* 6 - 20 mg/dL Final  . Creatinine, Ser 08/26/2015 0.90  0.44 - 1.00 mg/dL Final  . Calcium 08/26/2015 8.8* 8.9 - 10.3 mg/dL Final  . GFR calc non Af Amer 08/26/2015 >60  >60 mL/min Final  . GFR calc Af Amer 08/26/2015 >60  >60 mL/min Final   Comment: (NOTE) The eGFR has been calculated using the CKD EPI equation. This calculation has not been validated in all clinical situations. eGFR's persistently <60 mL/min signify possible Chronic Kidney Disease.   . Anion gap 08/26/2015 12  5 - 15 Final  . Troponin I 08/26/2015 0.15* <0.031 ng/mL Final   Comment:  PERSISTENTLY INCREASED TROPONIN VALUES IN THE RANGE OF 0.04-0.49 ng/mL CAN BE SEEN IN:       -UNSTABLE ANGINA       -CONGESTIVE HEART FAILURE       -MYOCARDITIS       -CHEST  TRAUMA       -ARRYHTHMIAS       -LATE PRESENTING MYOCARDIAL INFARCTION       -COPD   CLINICAL FOLLOW-UP RECOMMENDED.   Marland Kitchen Prothrombin Time 08/26/2015 14.6  11.6 - 15.2 seconds Final  . INR 08/26/2015 1.12  0.00 - 1.49 Final  . WBC 08/27/2015 11.2* 4.0 - 10.5 K/uL Final  . RBC 08/27/2015 3.97  3.87 - 5.11 MIL/uL Final  . Hemoglobin 08/27/2015 12.6  12.0 - 15.0 g/dL Final  . HCT 08/27/2015 38.0  36.0 - 46.0 % Final  . MCV 08/27/2015 95.7  78.0 - 100.0 fL Final  . MCH 08/27/2015 31.7  26.0 - 34.0 pg Final  . MCHC 08/27/2015 33.2  30.0 - 36.0 g/dL Final  . RDW 08/27/2015 11.3* 11.5 - 15.5 % Final  . Platelets 08/27/2015 507* 150 - 400 K/uL Final  . Heparin Unfractionated 08/27/2015 0.25* 0.30 - 0.70 IU/mL Final   Comment:        IF HEPARIN RESULTS ARE BELOW EXPECTED VALUES, AND PATIENT DOSAGE HAS BEEN CONFIRMED, SUGGEST FOLLOW UP TESTING OF ANTITHROMBIN III LEVELS.   Marland Kitchen Heparin Unfractionated 08/27/2015 0.27* 0.30 - 0.70 IU/mL Final   Comment:        IF HEPARIN RESULTS ARE BELOW EXPECTED VALUES, AND PATIENT DOSAGE HAS BEEN CONFIRMED, SUGGEST FOLLOW UP TESTING OF ANTITHROMBIN III LEVELS.   Marland Kitchen Sodium 08/27/2015 130* 135 - 145 mmol/L Final  . Potassium 08/27/2015 4.5  3.5 - 5.1 mmol/L Final  . Chloride 08/27/2015 88* 101 - 111 mmol/L Final  . CO2 08/27/2015 34* 22 - 32 mmol/L Final  . Glucose, Bld 08/27/2015 125* 65 - 99 mg/dL Final  . BUN 08/27/2015 21* 6 - 20 mg/dL Final  . Creatinine, Ser 08/27/2015 0.67  0.44 - 1.00 mg/dL Final  . Calcium 08/27/2015 8.5* 8.9 - 10.3 mg/dL Final  . GFR calc non Af Amer 08/27/2015 >60  >60 mL/min Final  . GFR calc Af Amer 08/27/2015 >60  >60 mL/min Final   Comment: (NOTE) The eGFR has been calculated using the CKD EPI equation. This calculation has not been validated in all clinical situations. eGFR's persistently <60 mL/min signify possible Chronic Kidney Disease.   . Anion gap 08/27/2015 8  5 - 15 Final  . Heparin Unfractionated 08/27/2015  0.22* 0.30 - 0.70 IU/mL Final   Comment:        IF HEPARIN RESULTS ARE BELOW EXPECTED VALUES, AND PATIENT DOSAGE HAS BEEN CONFIRMED, SUGGEST FOLLOW UP TESTING OF ANTITHROMBIN III LEVELS.   Marland Kitchen Sodium 08/28/2015 133* 135 - 145 mmol/L Final  . Potassium 08/28/2015 4.6  3.5 - 5.1 mmol/L Final  . Chloride 08/28/2015 88* 101 - 111 mmol/L Final  . CO2 08/28/2015 39* 22 - 32 mmol/L Final  . Glucose, Bld 08/28/2015 131* 65 - 99 mg/dL Final  . BUN 08/28/2015 13  6 - 20 mg/dL Final  . Creatinine, Ser 08/28/2015 0.61  0.44 - 1.00 mg/dL Final  . Calcium 08/28/2015 8.7* 8.9 - 10.3 mg/dL Final  . GFR calc non Af Amer 08/28/2015 >60  >60 mL/min Final  . GFR calc Af Amer 08/28/2015 >60  >60 mL/min Final   Comment: (NOTE) The eGFR has been calculated using the  CKD EPI equation. This calculation has not been validated in all clinical situations. eGFR's persistently <60 mL/min signify possible Chronic Kidney Disease.   . Anion gap 08/28/2015 6  5 - 15 Final  Admission on 08/18/2015, Discharged on 08/22/2015  Component Date Value Ref Range Status  . WBC 08/18/2015 9.2  4.0 - 10.5 K/uL Final  . RBC 08/18/2015 3.86* 3.87 - 5.11 MIL/uL Final  . Hemoglobin 08/18/2015 12.7  12.0 - 15.0 g/dL Final  . HCT 08/18/2015 36.8  36.0 - 46.0 % Final  . MCV 08/18/2015 95.3  78.0 - 100.0 fL Final  . MCH 08/18/2015 32.9  26.0 - 34.0 pg Final  . MCHC 08/18/2015 34.5  30.0 - 36.0 g/dL Final  . RDW 08/18/2015 11.9  11.5 - 15.5 % Final  . Platelets 08/18/2015 329  150 - 400 K/uL Final  . Neutrophils Relative % 08/18/2015 69   Final  . Neutro Abs 08/18/2015 6.2  1.7 - 7.7 K/uL Final  . Lymphocytes Relative 08/18/2015 16   Final  . Lymphs Abs 08/18/2015 1.5  0.7 - 4.0 K/uL Final  . Monocytes Relative 08/18/2015 15   Final  . Monocytes Absolute 08/18/2015 1.4* 0.1 - 1.0 K/uL Final  . Eosinophils Relative 08/18/2015 0   Final  . Eosinophils Absolute 08/18/2015 0.0  0.0 - 0.7 K/uL Final  . Basophils Relative 08/18/2015 0    Final  . Basophils Absolute 08/18/2015 0.0  0.0 - 0.1 K/uL Final  . Sodium 08/18/2015 136  135 - 145 mmol/L Final  . Potassium 08/18/2015 3.8  3.5 - 5.1 mmol/L Final  . Chloride 08/18/2015 101  101 - 111 mmol/L Final  . CO2 08/18/2015 21* 22 - 32 mmol/L Final  . Glucose, Bld 08/18/2015 94  65 - 99 mg/dL Final  . BUN 08/18/2015 6  6 - 20 mg/dL Final  . Creatinine, Ser 08/18/2015 0.65  0.44 - 1.00 mg/dL Final  . Calcium 08/18/2015 8.9  8.9 - 10.3 mg/dL Final  . GFR calc non Af Amer 08/18/2015 >60  >60 mL/min Final  . GFR calc Af Amer 08/18/2015 >60  >60 mL/min Final   Comment: (NOTE) The eGFR has been calculated using the CKD EPI equation. This calculation has not been validated in all clinical situations. eGFR's persistently <60 mL/min signify possible Chronic Kidney Disease.   . Anion gap 08/18/2015 14  5 - 15 Final  . Troponin i, poc 08/18/2015 0.00  0.00 - 0.08 ng/mL Final  . Comment 3 08/18/2015          Final   Comment: Due to the release kinetics of cTnI, a negative result within the first hours of the onset of symptoms does not rule out myocardial infarction with certainty. If myocardial infarction is still suspected, repeat the test at appropriate intervals.   . WBC 08/20/2015 8.1  4.0 - 10.5 K/uL Final  . RBC 08/20/2015 3.81* 3.87 - 5.11 MIL/uL Final  . Hemoglobin 08/20/2015 12.5  12.0 - 15.0 g/dL Final  . HCT 08/20/2015 37.5  36.0 - 46.0 % Final  . MCV 08/20/2015 98.4  78.0 - 100.0 fL Final  . MCH 08/20/2015 32.8  26.0 - 34.0 pg Final  . MCHC 08/20/2015 33.3  30.0 - 36.0 g/dL Final  . RDW 08/20/2015 11.9  11.5 - 15.5 % Final  . Platelets 08/20/2015 326  150 - 400 K/uL Final  . HIV Screen 4th Generation wRfx 08/22/2015 Non Reactive  Non Reactive Final   Comment: (NOTE) Performed At:  [facility] [place], [place] [id number] Lindon Romp MD [id number]   . HCV Ab 08/22/2015 <0.1  0.0 - 0.9 s/co ratio Final   Comment: (NOTE) Performed  At: [facility] [place], [place] [id number] Lindon Romp MD [id number]   . Total Protein 08/22/2015 6.8  6.5 - 8.1 g/dL Final  . Albumin 08/22/2015 3.5  3.5 - 5.0 g/dL Final  . AST 08/22/2015 27  15 - 41 U/L Final  . ALT 08/22/2015 18  14 - 54 U/L Final  . Alkaline Phosphatase 08/22/2015 71  38 - 126 U/L Final  . Total Bilirubin 08/22/2015 0.8  0.3 - 1.2 mg/dL Final  . Bilirubin, Direct 08/22/2015 0.3  0.1 - 0.5 mg/dL Final  . Indirect Bilirubin 08/22/2015 0.5  0.3 - 0.9 mg/dL Final  . Sodium 08/22/2015 138  135 - 145 mmol/L Final  . Potassium 08/22/2015 4.7  3.5 - 5.1 mmol/L Final  . Chloride 08/22/2015 89* 101 - 111 mmol/L Final  . CO2 08/22/2015 35* 22 - 32 mmol/L Final  . Glucose, Bld 08/22/2015 93  65 - 99 mg/dL Final  . BUN 08/22/2015 14  6 - 20 mg/dL Final  . Creatinine, Ser 08/22/2015 0.72  0.44 - 1.00 mg/dL Final  . Calcium 08/22/2015 10.1  8.9 - 10.3 mg/dL Final  . GFR calc non Af Amer 08/22/2015 >60  >60 mL/min Final  . GFR calc Af Amer 08/22/2015 >60  >60 mL/min Final   Comment: (NOTE) The eGFR has been calculated using the CKD EPI equation. This calculation has not been validated in all clinical situations. eGFR's persistently <60 mL/min signify possible Chronic Kidney Disease.   . Anion gap 08/22/2015 14  5 - 15 Final  . Comment: 08/22/2015 Comment   Final   Comment: (NOTE) Non reactive HCV antibody screen is consistent with no HCV infection, unless recent infection is suspected or other evidence exists to indicate HCV infection. Performed At: [facility] [place], [place] [id number] Lindon Romp MD YB:[id number]     Dg Chest 2 View  08/18/2015  CLINICAL DATA:  Acute onset of shortness of breath for 1 week. Initial encounter. EXAM: CHEST  2 VIEW COMPARISON:  None. FINDINGS: The lungs are hyperexpanded, with flattening of the hemidiaphragms, compatible with COPD. Bilateral nipple shadows are note. Mild  peribronchial thickening is seen. There is no evidence of pleural effusion or pneumothorax. The heart is normal in size; the mediastinal contour is within normal limits. No acute osseous abnormalities are seen. IMPRESSION: Findings of COPD.  No acute cardiopulmonary process identified. Electronically Signed   By: Garald Balding M.D.   On: 08/18/2015 21:19   Ct Angio Chest Pe W/cm &/or Wo Cm  08/27/2015  CLINICAL DATA:  Acute onset of shortness of breath and hypoxia. Initial encounter. EXAM: CT ANGIOGRAPHY CHEST WITH CONTRAST TECHNIQUE: Multidetector CT imaging of the chest was performed using the standard protocol during bolus administration of intravenous contrast. Multiplanar CT image reconstructions and MIPs were obtained to evaluate the vascular anatomy. CONTRAST:  107m OMNIPAQUE IOHEXOL 350 MG/ML SOLN COMPARISON:  Chest radiograph performed 08/25/2015 FINDINGS: There is no evidence of pulmonary embolus. Bilateral emphysematous change is noted. There is opacification of the bronchioles to the left lower lung lobe, with minimal underlying opacity, compatible with aspiration. A 5 mm nodule is noted at the right middle lobe (image 50 of 107). There is no evidence of pleural effusion or pneumothorax. No masses are identified;  no abnormal focal contrast enhancement is seen. The mediastinum is unremarkable in appearance. A small hiatal hernia is noted. No pericardial effusion is identified. No mediastinal lymphadenopathy is seen. The great vessels are grossly unremarkable in appearance. No axillary lymphadenopathy is seen. The thyroid gland is unremarkable in appearance. The visualized portions of the liver and spleen are unremarkable. The visualized portions of the pancreas, stomach, adrenal glands and kidneys are within normal limits. No acute osseous abnormalities are seen. Review of the MIP images confirms the above findings. IMPRESSION: 1. No evidence of pulmonary embolus. 2. Opacification of the bronchioles  to the left lower lung lobe, with underlying minimal airspace opacity, compatible with aspiration. 3. Bilateral emphysematous change noted. 4. 5 mm nodule at the right middle lobe. If the patient is at high risk for bronchogenic carcinoma, follow-up chest CT at 6-12 months is recommended. If the patient is at low risk for bronchogenic carcinoma, follow-up chest CT at 12 months is recommended. This recommendation follows the consensus statement: Guidelines for Management of Small Pulmonary Nodules Detected on CT Scans: A Statement from the [employer] as published in Radiology 2005;237:395-400. 5. Small hiatal hernia noted. Electronically Signed   By: Garald Balding M.D.   On: 08/27/2015 22:35   Dg Chest Portable 1 View  08/25/2015  CLINICAL DATA:  Acute onset of shortness of breath. Initial encounter. EXAM: PORTABLE CHEST 1 VIEW COMPARISON:  Chest radiograph performed 08/18/2015 FINDINGS: The lungs are well-aerated and clear. There is no evidence of focal opacification, pleural effusion or pneumothorax. Bilateral nipple shadows are seen. The cardiomediastinal silhouette is within normal limits. No acute osseous abnormalities are seen. IMPRESSION: No acute cardiopulmonary process seen. Electronically Signed   By: Garald Balding M.D.   On: 08/25/2015 21:16     Assessment/Plan   ICD-9-CM ICD-10-CM   1. Pulmonary emphysema, unspecified emphysema type ([place]) [name] dependent; s/p recent exacerbation  492.8 J43.9   2. Multiple pulmonary nodules determined by computed tomography of lung - NEW 793.19 R91.8   3. Chronic post-traumatic stress disorder (PTSD) - stable 309.81 F43.12   4. Anxiety state - stable 300.00 F41.1   5. Essential hypertension - stable 401.9 I10   6. Protein-calorie malnutrition, severe 262 E43   7. Gastroesophageal reflux disease, esophagitis presence not specified - uncontrolled 530.81 K21.9     Start omeprazole 54m daily for reflux sx's  Cont azithromycin until seen by  pulmonary  Cont other meds as ordered  F/u with pulmonary as scheduled  PT/OT as ordered. ST as indicated  GOAL: short term rehab and d/c home when medically appropriate. Communicated with pt and nursing.  Will follow  Talyah Seder S. CPerlie Gold PLos Angeles Surgical Center [facility] Corporation[facility] [address]GMorris [place] [phone] (Monday-Friday 8 AM - 5 PM) [phone] 5 PM and follow prompts

## 2015-09-02 ENCOUNTER — Ambulatory Visit: Payer: Medicare Other | Admitting: Internal Medicine

## 2015-09-02 ENCOUNTER — Institutional Professional Consult (permissible substitution): Payer: Medicare Other | Admitting: Pulmonary Disease

## 2015-09-11 ENCOUNTER — Encounter: Payer: Self-pay | Admitting: Internal Medicine

## 2015-09-11 ENCOUNTER — Non-Acute Institutional Stay (SKILLED_NURSING_FACILITY): Payer: Medicare Other | Admitting: Internal Medicine

## 2015-09-11 DIAGNOSIS — R059 Cough, unspecified: Secondary | ICD-10-CM

## 2015-09-11 DIAGNOSIS — R05 Cough: Secondary | ICD-10-CM | POA: Diagnosis not present

## 2015-09-11 DIAGNOSIS — J439 Emphysema, unspecified: Secondary | ICD-10-CM

## 2015-09-11 NOTE — Progress Notes (Signed)
Patient ID: Megan [name], female   DOB: [birth year], 64 y.o.   MRN: [id number]    DATE: 09/11/15  Location:  [facility]    Place of Service: SNF (31)   Extended Emergency Contact Information Primary Emergency Contact: Arther [place] Phone: [phone] Relation: Daughter Secondary Emergency Contact: [place] Mobile Phone: [phone] Relation: Son  Advanced Directive information Does patient have an advance directive?: Yes, Type of Advance Directive: Out of facility DNR (pink MOST or yellow form), Pre-existing out of facility DNR order (yellow form or pink MOST form): Pink MOST form placed in chart (order not valid for inpatient use)  Chief Complaint  Patient presents with  . Acute Visit    cough    HPI:  64 yo female seen today for cough. She has emphysema and was recently d/cd from hospital with COPD exacerbation. Pt reports feeling bad yesterday but is feeling better today. She is ready to go home. (+) SOB but no CP. No falls. She is a poor historian due to psych d/o. Hx obtained from chart.  COPD - she takes advair, prednisone, ellipta, [place] O2 at 3L/min, duonebs  Past Medical History  Diagnosis Date  . Myocardial infarction ([place])   . CHF (congestive heart failure) ([place])   . COPD (chronic obstructive pulmonary disease) ([place])   . Shortness of breath dyspnea   . Depression   . GERD (gastroesophageal reflux disease)   . History of hiatal hernia   . Headache   . Anemia   . Essential hypertension 08/19/2015  . Anxiety state 08/20/2015    Past Surgical History  Procedure Laterality Date  . Cardiac catheterization    . Cardiac catheterization N/A 08/26/2015    Procedure: Left Heart Cath and Coronary Angiography;  Surgeon: Jettie Booze, MD;  Location: [facility];  Service: Cardiovascular;  Laterality: N/A;    Patient Care Team: Pcp Not In System as PCP - General  Social History     Social History  . Marital Status: Widowed    Spouse Name: N/A  . Number of Children: N/A  . Years of Education: N/A   Occupational History  . Not on file.   Social History Main Topics  . Smoking status: Former Smoker    Quit date: 08/18/2000  . Smokeless tobacco: Never Used  . Alcohol Use: No  . Drug Use: No  . Sexual Activity: No   Other Topics Concern  . Not on file   Social History Narrative     reports that she quit smoking about 15 years ago. She has never used smokeless tobacco. She reports that she does not drink alcohol or use illicit drugs.   There is no immunization history on file for this patient.  Allergies  Allergen Reactions  . Penicillins Rash    Has patient had a PCN reaction causing immediate rash, facial/tongue/throat swelling, SOB or lightheadedness with hypotension: {Yes Has patient had a PCN reaction causing severe rash involving mucus membranes or skin necrosis:NO Has patient had a PCN reaction that required hospitalization {Yes Has patient had a PCN reaction occurring within the last 10 years:NO If all of the above answers are "NO", then may proceed with Cephalosporin use.    Medications: Patient's Medications  New Prescriptions   No medications on file  Previous Medications   ACETAMINOPHEN (TYLENOL) 500 MG TABLET    Take 500 mg by mouth every 6 (six) hours as needed for  mild pain.   ALBUTEROL (PROVENTIL HFA;VENTOLIN HFA) 108 (90 BASE) MCG/ACT INHALER    Inhale 1 puff into the lungs every 4 (four) hours as needed for wheezing or shortness of breath.   ALPRAZOLAM (XANAX) 0.25 MG TABLET    Take 1 tablet (0.25 mg total) by mouth 2 (two) times daily as needed for anxiety.   ASPIRIN 81 MG CHEWABLE TABLET    Chew 81 mg by mouth daily.   AZITHROMYCIN (ZITHROMAX) 250 MG TABLET    Take 1 tablet (250 mg total) by mouth daily.   BUSPIRONE (BUSPAR) 7.5 MG TABLET    Take 2 tablets (15 mg total) by mouth 2 (two) times daily.    BUTALBITAL-ACETAMINOPHEN-CAFFEINE (FIORICET) 50-325-40 MG TABLET    Take 1-2 tablets by mouth every 6 (six) hours as needed for headache.   BUTALBITAL-ACETAMINOPHEN-CAFFEINE (FIORICET, ESGIC) 50-325-40 MG TABLET    Take 1 tablet by mouth 2 (two) times daily as needed for headache.   CYPROHEPTADINE (PERIACTIN) 4 MG TABLET    Take 4 mg by mouth daily as needed (appetitie stimulant).   ESCITALOPRAM (LEXAPRO) 20 MG TABLET    Take 2 tablets (40 mg total) by mouth daily.   FLUTICASONE-SALMETEROL (ADVAIR) 500-50 MCG/DOSE AEPB    Inhale 1 puff into the lungs 2 (two) times daily.   IPRATROPIUM-ALBUTEROL (DUONEB) 0.5-2.5 (3) MG/3ML SOLN    Take 3 mLs by nebulization every 6 (six) hours as needed (shortness of breath).   LIDOCAINE (LIDODERM) 5 %    Place 1 patch onto the skin daily as needed (pain). Remove & Discard patch within 12 hours or as directed by MD   LISINOPRIL (PRINIVIL,ZESTRIL) 2.5 MG TABLET    Take 2.5 mg by mouth daily.   MELATONIN 3 MG TABS    Take 1 tablet by mouth at bedtime as needed (sleep).   MULTIPLE VITAMIN (MULTIVITAMIN WITH MINERALS) TABS TABLET    Take 1 tablet by mouth daily.   PREDNISONE (DELTASONE) 20 MG TABLET    Take 1 tablet (20 mg total) by mouth daily with breakfast.   PROMETHAZINE (PHENERGAN) 12.5 MG TABLET    Take 12.5 mg by mouth every 6 (six) hours as needed for nausea or vomiting.   UMECLIDINIUM BROMIDE (INCRUSE ELLIPTA) 62.5 MCG/INH AEPB    Inhale 1 puff into the lungs daily.  Modified Medications   No medications on file  Discontinued Medications   No medications on file    Review of Systems  Unable to perform ROS: Other  psych d/o  Filed Vitals:   09/11/15 1453  BP: 115/75  Pulse: 80  Temp: 97.9 F (36.6 C)  TempSrc: Oral  Resp: 18  SpO2: 98%   There is no weight on file to calculate BMI.  Physical Exam  Constitutional: She appears well-developed. No distress.  Sitting in w/c in NAD. [place] O2 intact  Cardiovascular: Regular rhythm.  Tachycardia  present.   No LE edema b/l. No calf TTP  Pulmonary/Chest: She has decreased breath sounds (b/l at base). She has no wheezes. She has no rhonchi. She has no rales.  Neurological: She is alert.  Skin: Skin is warm and dry. No rash noted.  Psychiatric: Her behavior is normal. Her mood appears anxious.     Labs reviewed: Admission on 08/25/2015, Discharged on 08/29/2015  Component Date Value Ref Range Status  . Sodium 08/25/2015 129* 135 - 145 mmol/L Final  . Potassium 08/25/2015 4.9  3.5 - 5.1 mmol/L Final  . Chloride 08/25/2015 86* 101 -  111 mmol/L Final  . CO2 08/25/2015 26  22 - 32 mmol/L Final  . Glucose, Bld 08/25/2015 110* 65 - 99 mg/dL Final  . BUN 08/25/2015 20  6 - 20 mg/dL Final  . Creatinine, Ser 08/25/2015 0.87  0.44 - 1.00 mg/dL Final  . Calcium 08/25/2015 9.1  8.9 - 10.3 mg/dL Final  . Total Protein 08/25/2015 7.1  6.5 - 8.1 g/dL Final  . Albumin 08/25/2015 4.1  3.5 - 5.0 g/dL Final  . AST 08/25/2015 31  15 - 41 U/L Final  . ALT 08/25/2015 21  14 - 54 U/L Final  . Alkaline Phosphatase 08/25/2015 67  38 - 126 U/L Final  . Total Bilirubin 08/25/2015 0.9  0.3 - 1.2 mg/dL Final  . GFR calc non Af Amer 08/25/2015 >60  >60 mL/min Final  . GFR calc Af Amer 08/25/2015 >60  >60 mL/min Final   Comment: (NOTE) The eGFR has been calculated using the CKD EPI equation. This calculation has not been validated in all clinical situations. eGFR's persistently <60 mL/min signify possible Chronic Kidney Disease.   . Anion gap 08/25/2015 17* 5 - 15 Final  . Troponin i, poc 08/25/2015 0.19* 0.00 - 0.08 ng/mL Final  . Comment 08/25/2015 NOTIFIED PHYSICIAN   Final  . Comment 3 08/25/2015          Final   Comment: Due to the release kinetics of cTnI, a negative result within the first hours of the onset of symptoms does not rule out myocardial infarction with certainty. If myocardial infarction is still suspected, repeat the test at appropriate intervals.   . WBC 08/25/2015 9.9  4.0 -  10.5 K/uL Final   WHITE COUNT CONFIRMED ON SMEAR  . RBC 08/25/2015 4.63  3.87 - 5.11 MIL/uL Final  . Hemoglobin 08/25/2015 15.2* 12.0 - 15.0 g/dL Final  . HCT 08/25/2015 43.1  36.0 - 46.0 % Final  . MCV 08/25/2015 93.1  78.0 - 100.0 fL Final  . MCH 08/25/2015 32.8  26.0 - 34.0 pg Final  . MCHC 08/25/2015 35.3  30.0 - 36.0 g/dL Final  . RDW 08/25/2015 11.3* 11.5 - 15.5 % Final  . Platelets 08/25/2015 PLATELET CLUMPS NOTED ON SMEAR, COUNT APPEARS ADEQUATE  150 - 400 K/uL Final   SPECIMEN CHECKED FOR CLOTS  . Neutrophils Relative % 08/25/2015 66   Final  . Lymphocytes Relative 08/25/2015 29   Final  . Monocytes Relative 08/25/2015 5   Final  . Eosinophils Relative 08/25/2015 0   Final  . Basophils Relative 08/25/2015 0   Final  . Neutro Abs 08/25/2015 6.5  1.7 - 7.7 K/uL Final  . Lymphs Abs 08/25/2015 2.9  0.7 - 4.0 K/uL Final  . Monocytes Absolute 08/25/2015 0.5  0.1 - 1.0 K/uL Final  . Eosinophils Absolute 08/25/2015 0.0  0.0 - 0.7 K/uL Final  . Basophils Absolute 08/25/2015 0.0  0.0 - 0.1 K/uL Final  . Smear Review 08/25/2015 MORPHOLOGY UNREMARKABLE   Final  . pH, Arterial 08/25/2015 7.218* 7.350 - 7.450 Final  . pCO2 arterial 08/25/2015 86.2* 35.0 - 45.0 mmHg Final  . pO2, Arterial 08/25/2015 100.0  80.0 - 100.0 mmHg Final  . Bicarbonate 08/25/2015 35.1* 20.0 - 24.0 mEq/L Final  . TCO2 08/25/2015 38  0 - 100 mmol/L Final  . O2 Saturation 08/25/2015 96.0   Final  . Acid-Base Excess 08/25/2015 3.0* 0.0 - 2.0 mmol/L Final  . Patient temperature 08/25/2015 98.6 F   Final  . Collection site 08/25/2015 FEMORAL  ARTERY   Final  . Drawn by 08/25/2015 MD   Final  . Sample type 08/25/2015 ARTERIAL   Final  . Comment 08/25/2015 NOTIFIED PHYSICIAN   Final  . Color, Urine 08/25/2015 YELLOW  YELLOW Final  . APPearance 08/25/2015 CLEAR  CLEAR Final  . Specific Gravity, Urine 08/25/2015 1.019  1.005 - 1.030 Final  . pH 08/25/2015 6.0  5.0 - 8.0 Final  . Glucose, UA 08/25/2015 NEGATIVE  NEGATIVE  mg/dL Final  . Hgb urine dipstick 08/25/2015 SMALL* NEGATIVE Final  . Bilirubin Urine 08/25/2015 NEGATIVE  NEGATIVE Final  . Ketones, ur 08/25/2015 40* NEGATIVE mg/dL Final  . Protein, ur 08/25/2015 30* NEGATIVE mg/dL Final  . Urobilinogen, UA 08/25/2015 0.2  0.0 - 1.0 mg/dL Final  . Nitrite 08/25/2015 NEGATIVE  NEGATIVE Final  . Leukocytes, UA 08/25/2015 NEGATIVE  NEGATIVE Final  . Specimen Description 08/25/2015 URINE, CATHETERIZED   Final  . Special Requests 08/25/2015 NONE   Final  . Culture 08/25/2015 NO GROWTH 2 DAYS   Final  . Report Status 08/25/2015 08/27/2015 FINAL   Final  . Sodium 08/25/2015 129* 135 - 145 mmol/L Final  . Potassium 08/25/2015 4.7  3.5 - 5.1 mmol/L Final  . Chloride 08/25/2015 89* 101 - 111 mmol/L Final  . BUN 08/25/2015 28* 6 - 20 mg/dL Final  . Creatinine, Ser 08/25/2015 0.90  0.44 - 1.00 mg/dL Final  . Glucose, Bld 08/25/2015 112* 65 - 99 mg/dL Final  . Calcium, Ion 08/25/2015 1.05* 1.13 - 1.30 mmol/L Final  . TCO2 08/25/2015 30  0 - 100 mmol/L Final  . Hemoglobin 08/25/2015 16.7* 12.0 - 15.0 g/dL Final  . HCT 08/25/2015 49.0* 36.0 - 46.0 % Final  . WBC, UA 08/25/2015 0-2  <3 WBC/hpf Final  . RBC / HPF 08/25/2015 3-6  <3 RBC/hpf Final  . Bacteria, UA 08/25/2015 RARE  RARE Final  . Troponin I 08/26/2015 0.22* <0.031 ng/mL Final   Comment:        PERSISTENTLY INCREASED TROPONIN VALUES IN THE RANGE OF 0.04-0.49 ng/mL CAN BE SEEN IN:       -UNSTABLE ANGINA       -CONGESTIVE HEART FAILURE       -MYOCARDITIS       -CHEST TRAUMA       -ARRYHTHMIAS       -LATE PRESENTING MYOCARDIAL INFARCTION       -COPD   CLINICAL FOLLOW-UP RECOMMENDED.   Marland Kitchen pH, Arterial 08/25/2015 7.315* 7.350 - 7.450 Final  . pCO2 arterial 08/25/2015 64.7* 35.0 - 45.0 mmHg Final  . pO2, Arterial 08/25/2015 74.0* 80.0 - 100.0 mmHg Final  . Bicarbonate 08/25/2015 33.0* 20.0 - 24.0 mEq/L Final  . TCO2 08/25/2015 35  0 - 100 mmol/L Final  . O2 Saturation 08/25/2015 93.0   Final  .  Acid-Base Excess 08/25/2015 4.0* 0.0 - 2.0 mmol/L Final  . Patient temperature 08/25/2015 98.6 F   Final  . Collection site 08/25/2015 BRACHIAL ARTERY   Final  . Drawn by 08/25/2015 RT   Final  . Sample type 08/25/2015 ARTERIAL   Final  . Comment 08/25/2015 NOTIFIED PHYSICIAN   Final  . Heparin Unfractionated 08/26/2015 0.10* 0.30 - 0.70 IU/mL Final   Comment:        IF HEPARIN RESULTS ARE BELOW EXPECTED VALUES, AND PATIENT DOSAGE HAS BEEN CONFIRMED, SUGGEST FOLLOW UP TESTING OF ANTITHROMBIN III LEVELS.   . WBC 08/26/2015 6.9  4.0 - 10.5 K/uL Final  . RBC 08/26/2015 4.33  3.87 -  5.11 MIL/uL Final  . Hemoglobin 08/26/2015 13.7  12.0 - 15.0 g/dL Final  . HCT 08/26/2015 40.7  36.0 - 46.0 % Final  . MCV 08/26/2015 94.0  78.0 - 100.0 fL Final  . MCH 08/26/2015 31.6  26.0 - 34.0 pg Final  . MCHC 08/26/2015 33.7  30.0 - 36.0 g/dL Final  . RDW 08/26/2015 11.3* 11.5 - 15.5 % Final  . Platelets 08/26/2015 642* 150 - 400 K/uL Final  . Troponin I 08/26/2015 0.20* <0.031 ng/mL Final   Comment:        PERSISTENTLY INCREASED TROPONIN VALUES IN THE RANGE OF 0.04-0.49 ng/mL CAN BE SEEN IN:       -UNSTABLE ANGINA       -CONGESTIVE HEART FAILURE       -MYOCARDITIS       -CHEST TRAUMA       -ARRYHTHMIAS       -LATE PRESENTING MYOCARDIAL INFARCTION       -COPD   CLINICAL FOLLOW-UP RECOMMENDED.   Marland Kitchen MRSA by PCR 08/26/2015 POSITIVE* NEGATIVE Final   Comment:        The GeneXpert MRSA Assay (FDA approved for NASAL specimens only), is one component of a comprehensive MRSA colonization surveillance program. It is not intended to diagnose MRSA infection nor to guide or monitor treatment for MRSA infections. RESULT CALLED TO, READ BACK BY AND VERIFIED WITH: A PETTIFORD '@0530'  08/26/15 MKELLY   . Sodium 08/26/2015 129* 135 - 145 mmol/L Final  . Potassium 08/26/2015 4.5  3.5 - 5.1 mmol/L Final  . Chloride 08/26/2015 86* 101 - 111 mmol/L Final  . CO2 08/26/2015 31  22 - 32 mmol/L Final  .  Glucose, Bld 08/26/2015 139* 65 - 99 mg/dL Final  . BUN 08/26/2015 28* 6 - 20 mg/dL Final  . Creatinine, Ser 08/26/2015 0.90  0.44 - 1.00 mg/dL Final  . Calcium 08/26/2015 8.8* 8.9 - 10.3 mg/dL Final  . GFR calc non Af Amer 08/26/2015 >60  >60 mL/min Final  . GFR calc Af Amer 08/26/2015 >60  >60 mL/min Final   Comment: (NOTE) The eGFR has been calculated using the CKD EPI equation. This calculation has not been validated in all clinical situations. eGFR's persistently <60 mL/min signify possible Chronic Kidney Disease.   . Anion gap 08/26/2015 12  5 - 15 Final  . Troponin I 08/26/2015 0.15* <0.031 ng/mL Final   Comment:        PERSISTENTLY INCREASED TROPONIN VALUES IN THE RANGE OF 0.04-0.49 ng/mL CAN BE SEEN IN:       -UNSTABLE ANGINA       -CONGESTIVE HEART FAILURE       -MYOCARDITIS       -CHEST TRAUMA       -ARRYHTHMIAS       -LATE PRESENTING MYOCARDIAL INFARCTION       -COPD   CLINICAL FOLLOW-UP RECOMMENDED.   Marland Kitchen Prothrombin Time 08/26/2015 14.6  11.6 - 15.2 seconds Final  . INR 08/26/2015 1.12  0.00 - 1.49 Final  . WBC 08/27/2015 11.2* 4.0 - 10.5 K/uL Final  . RBC 08/27/2015 3.97  3.87 - 5.11 MIL/uL Final  . Hemoglobin 08/27/2015 12.6  12.0 - 15.0 g/dL Final  . HCT 08/27/2015 38.0  36.0 - 46.0 % Final  . MCV 08/27/2015 95.7  78.0 - 100.0 fL Final  . MCH 08/27/2015 31.7  26.0 - 34.0 pg Final  . MCHC 08/27/2015 33.2  30.0 - 36.0 g/dL Final  . RDW 08/27/2015 11.3* 11.5 -  15.5 % Final  . Platelets 08/27/2015 507* 150 - 400 K/uL Final  . Heparin Unfractionated 08/27/2015 0.25* 0.30 - 0.70 IU/mL Final   Comment:        IF HEPARIN RESULTS ARE BELOW EXPECTED VALUES, AND PATIENT DOSAGE HAS BEEN CONFIRMED, SUGGEST FOLLOW UP TESTING OF ANTITHROMBIN III LEVELS.   Marland Kitchen Heparin Unfractionated 08/27/2015 0.27* 0.30 - 0.70 IU/mL Final   Comment:        IF HEPARIN RESULTS ARE BELOW EXPECTED VALUES, AND PATIENT DOSAGE HAS BEEN CONFIRMED, SUGGEST FOLLOW UP TESTING OF ANTITHROMBIN  III LEVELS.   Marland Kitchen Sodium 08/27/2015 130* 135 - 145 mmol/L Final  . Potassium 08/27/2015 4.5  3.5 - 5.1 mmol/L Final  . Chloride 08/27/2015 88* 101 - 111 mmol/L Final  . CO2 08/27/2015 34* 22 - 32 mmol/L Final  . Glucose, Bld 08/27/2015 125* 65 - 99 mg/dL Final  . BUN 08/27/2015 21* 6 - 20 mg/dL Final  . Creatinine, Ser 08/27/2015 0.67  0.44 - 1.00 mg/dL Final  . Calcium 08/27/2015 8.5* 8.9 - 10.3 mg/dL Final  . GFR calc non Af Amer 08/27/2015 >60  >60 mL/min Final  . GFR calc Af Amer 08/27/2015 >60  >60 mL/min Final   Comment: (NOTE) The eGFR has been calculated using the CKD EPI equation. This calculation has not been validated in all clinical situations. eGFR's persistently <60 mL/min signify possible Chronic Kidney Disease.   . Anion gap 08/27/2015 8  5 - 15 Final  . Heparin Unfractionated 08/27/2015 0.22* 0.30 - 0.70 IU/mL Final   Comment:        IF HEPARIN RESULTS ARE BELOW EXPECTED VALUES, AND PATIENT DOSAGE HAS BEEN CONFIRMED, SUGGEST FOLLOW UP TESTING OF ANTITHROMBIN III LEVELS.   Marland Kitchen Sodium 08/28/2015 133* 135 - 145 mmol/L Final  . Potassium 08/28/2015 4.6  3.5 - 5.1 mmol/L Final  . Chloride 08/28/2015 88* 101 - 111 mmol/L Final  . CO2 08/28/2015 39* 22 - 32 mmol/L Final  . Glucose, Bld 08/28/2015 131* 65 - 99 mg/dL Final  . BUN 08/28/2015 13  6 - 20 mg/dL Final  . Creatinine, Ser 08/28/2015 0.61  0.44 - 1.00 mg/dL Final  . Calcium 08/28/2015 8.7* 8.9 - 10.3 mg/dL Final  . GFR calc non Af Amer 08/28/2015 >60  >60 mL/min Final  . GFR calc Af Amer 08/28/2015 >60  >60 mL/min Final   Comment: (NOTE) The eGFR has been calculated using the CKD EPI equation. This calculation has not been validated in all clinical situations. eGFR's persistently <60 mL/min signify possible Chronic Kidney Disease.   . Anion gap 08/28/2015 6  5 - 15 Final  Admission on 08/18/2015, Discharged on 08/22/2015  Component Date Value Ref Range Status  . WBC 08/18/2015 9.2  4.0 - 10.5 K/uL Final  .  RBC 08/18/2015 3.86* 3.87 - 5.11 MIL/uL Final  . Hemoglobin 08/18/2015 12.7  12.0 - 15.0 g/dL Final  . HCT 08/18/2015 36.8  36.0 - 46.0 % Final  . MCV 08/18/2015 95.3  78.0 - 100.0 fL Final  . MCH 08/18/2015 32.9  26.0 - 34.0 pg Final  . MCHC 08/18/2015 34.5  30.0 - 36.0 g/dL Final  . RDW 08/18/2015 11.9  11.5 - 15.5 % Final  . Platelets 08/18/2015 329  150 - 400 K/uL Final  . Neutrophils Relative % 08/18/2015 69   Final  . Neutro Abs 08/18/2015 6.2  1.7 - 7.7 K/uL Final  . Lymphocytes Relative 08/18/2015 16   Final  . Lymphs Abs  08/18/2015 1.5  0.7 - 4.0 K/uL Final  . Monocytes Relative 08/18/2015 15   Final  . Monocytes Absolute 08/18/2015 1.4* 0.1 - 1.0 K/uL Final  . Eosinophils Relative 08/18/2015 0   Final  . Eosinophils Absolute 08/18/2015 0.0  0.0 - 0.7 K/uL Final  . Basophils Relative 08/18/2015 0   Final  . Basophils Absolute 08/18/2015 0.0  0.0 - 0.1 K/uL Final  . Sodium 08/18/2015 136  135 - 145 mmol/L Final  . Potassium 08/18/2015 3.8  3.5 - 5.1 mmol/L Final  . Chloride 08/18/2015 101  101 - 111 mmol/L Final  . CO2 08/18/2015 21* 22 - 32 mmol/L Final  . Glucose, Bld 08/18/2015 94  65 - 99 mg/dL Final  . BUN 08/18/2015 6  6 - 20 mg/dL Final  . Creatinine, Ser 08/18/2015 0.65  0.44 - 1.00 mg/dL Final  . Calcium 08/18/2015 8.9  8.9 - 10.3 mg/dL Final  . GFR calc non Af Amer 08/18/2015 >60  >60 mL/min Final  . GFR calc Af Amer 08/18/2015 >60  >60 mL/min Final   Comment: (NOTE) The eGFR has been calculated using the CKD EPI equation. This calculation has not been validated in all clinical situations. eGFR's persistently <60 mL/min signify possible Chronic Kidney Disease.   . Anion gap 08/18/2015 14  5 - 15 Final  . Troponin i, poc 08/18/2015 0.00  0.00 - 0.08 ng/mL Final  . Comment 3 08/18/2015          Final   Comment: Due to the release kinetics of cTnI, a negative result within the first hours of the onset of symptoms does not rule out myocardial infarction with  certainty. If myocardial infarction is still suspected, repeat the test at appropriate intervals.   . WBC 08/20/2015 8.1  4.0 - 10.5 K/uL Final  . RBC 08/20/2015 3.81* 3.87 - 5.11 MIL/uL Final  . Hemoglobin 08/20/2015 12.5  12.0 - 15.0 g/dL Final  . HCT 08/20/2015 37.5  36.0 - 46.0 % Final  . MCV 08/20/2015 98.4  78.0 - 100.0 fL Final  . MCH 08/20/2015 32.8  26.0 - 34.0 pg Final  . MCHC 08/20/2015 33.3  30.0 - 36.0 g/dL Final  . RDW 08/20/2015 11.9  11.5 - 15.5 % Final  . Platelets 08/20/2015 326  150 - 400 K/uL Final  . HIV Screen 4th Generation wRfx 08/22/2015 Non Reactive  Non Reactive Final   Comment: (NOTE) Performed At: [facility] [place], [place] [id number] Lindon Romp MD [id number]   . HCV Ab 08/22/2015 <0.1  0.0 - 0.9 s/co ratio Final   Comment: (NOTE) Performed At: [facility] [place], [place] [id number] Lindon Romp MD [id number]   . Total Protein 08/22/2015 6.8  6.5 - 8.1 g/dL Final  . Albumin 08/22/2015 3.5  3.5 - 5.0 g/dL Final  . AST 08/22/2015 27  15 - 41 U/L Final  . ALT 08/22/2015 18  14 - 54 U/L Final  . Alkaline Phosphatase 08/22/2015 71  38 - 126 U/L Final  . Total Bilirubin 08/22/2015 0.8  0.3 - 1.2 mg/dL Final  . Bilirubin, Direct 08/22/2015 0.3  0.1 - 0.5 mg/dL Final  . Indirect Bilirubin 08/22/2015 0.5  0.3 - 0.9 mg/dL Final  . Sodium 08/22/2015 138  135 - 145 mmol/L Final  . Potassium 08/22/2015 4.7  3.5 - 5.1 mmol/L Final  . Chloride 08/22/2015 89* 101 - 111 mmol/L Final  . CO2 08/22/2015 35* 22 -  32 mmol/L Final  . Glucose, Bld 08/22/2015 93  65 - 99 mg/dL Final  . BUN 08/22/2015 14  6 - 20 mg/dL Final  . Creatinine, Ser 08/22/2015 0.72  0.44 - 1.00 mg/dL Final  . Calcium 08/22/2015 10.1  8.9 - 10.3 mg/dL Final  . GFR calc non Af Amer 08/22/2015 >60  >60 mL/min Final  . GFR calc Af Amer 08/22/2015 >60  >60 mL/min Final   Comment: (NOTE) The eGFR has been calculated using the CKD EPI  equation. This calculation has not been validated in all clinical situations. eGFR's persistently <60 mL/min signify possible Chronic Kidney Disease.   . Anion gap 08/22/2015 14  5 - 15 Final  . Comment: 08/22/2015 Comment   Final   Comment: (NOTE) Non reactive HCV antibody screen is consistent with no HCV infection, unless recent infection is suspected or other evidence exists to indicate HCV infection. Performed At: [facility] [place], [place] [id number] Lindon Romp MD [id number]     Dg Chest 2 View  08/18/2015  CLINICAL DATA:  Acute onset of shortness of breath for 1 week. Initial encounter. EXAM: CHEST  2 VIEW COMPARISON:  None. FINDINGS: The lungs are hyperexpanded, with flattening of the hemidiaphragms, compatible with COPD. Bilateral nipple shadows are note. Mild peribronchial thickening is seen. There is no evidence of pleural effusion or pneumothorax. The heart is normal in size; the mediastinal contour is within normal limits. No acute osseous abnormalities are seen. IMPRESSION: Findings of COPD.  No acute cardiopulmonary process identified. Electronically Signed   By: Garald Balding M.D.   On: 08/18/2015 21:19   Ct Angio Chest Pe W/cm &/or Wo Cm  08/27/2015  CLINICAL DATA:  Acute onset of shortness of breath and hypoxia. Initial encounter. EXAM: CT ANGIOGRAPHY CHEST WITH CONTRAST TECHNIQUE: Multidetector CT imaging of the chest was performed using the standard protocol during bolus administration of intravenous contrast. Multiplanar CT image reconstructions and MIPs were obtained to evaluate the vascular anatomy. CONTRAST:  26m OMNIPAQUE IOHEXOL 350 MG/ML SOLN COMPARISON:  Chest radiograph performed 08/25/2015 FINDINGS: There is no evidence of pulmonary embolus. Bilateral emphysematous change is noted. There is opacification of the bronchioles to the left lower lung lobe, with minimal underlying opacity, compatible with aspiration. A 5 mm nodule is  noted at the right middle lobe (image 50 of 107). There is no evidence of pleural effusion or pneumothorax. No masses are identified; no abnormal focal contrast enhancement is seen. The mediastinum is unremarkable in appearance. A small hiatal hernia is noted. No pericardial effusion is identified. No mediastinal lymphadenopathy is seen. The great vessels are grossly unremarkable in appearance. No axillary lymphadenopathy is seen. The thyroid gland is unremarkable in appearance. The visualized portions of the liver and spleen are unremarkable. The visualized portions of the pancreas, stomach, adrenal glands and kidneys are within normal limits. No acute osseous abnormalities are seen. Review of the MIP images confirms the above findings. IMPRESSION: 1. No evidence of pulmonary embolus. 2. Opacification of the bronchioles to the left lower lung lobe, with underlying minimal airspace opacity, compatible with aspiration. 3. Bilateral emphysematous change noted. 4. 5 mm nodule at the right middle lobe. If the patient is at high risk for bronchogenic carcinoma, follow-up chest CT at 6-12 months is recommended. If the patient is at low risk for bronchogenic carcinoma, follow-up chest CT at 12 months is recommended. This recommendation follows the consensus statement: Guidelines for Management of Small Pulmonary Nodules Detected on CT  Scans: A Statement from the [place] as published in Radiology 2005;237:395-400. 5. Small hiatal hernia noted. Electronically Signed   By: Garald Balding M.D.   On: 08/27/2015 22:35   Dg Chest Portable 1 View  08/25/2015  CLINICAL DATA:  Acute onset of shortness of breath. Initial encounter. EXAM: PORTABLE CHEST 1 VIEW COMPARISON:  Chest radiograph performed 08/18/2015 FINDINGS: The lungs are well-aerated and clear. There is no evidence of focal opacification, pleural effusion or pneumothorax. Bilateral nipple shadows are seen. The cardiomediastinal silhouette is within normal  limits. No acute osseous abnormalities are seen. IMPRESSION: No acute cardiopulmonary process seen. Electronically Signed   By: Garald Balding M.D.   On: 08/25/2015 21:16     Assessment/Plan   ICD-9-CM ICD-10-CM   1. Cough 786.2 R05   2. Pulmonary emphysema, unspecified emphysema type ([place]) 492.8 J43.9     No change in therapy. She is actually improved since she was last seen. Will follow  Briyonna Omara S. Perlie Gold  [facility] [address] [place], [place] [address] [phone] Cell (Monday-Friday 8 AM - 5 PM) [phone] After 5 PM and follow prompts

## 2015-09-15 ENCOUNTER — Non-Acute Institutional Stay (SKILLED_NURSING_FACILITY): Payer: Medicare Other | Admitting: Nurse Practitioner

## 2015-09-15 DIAGNOSIS — E43 Unspecified severe protein-calorie malnutrition: Secondary | ICD-10-CM | POA: Diagnosis not present

## 2015-09-15 DIAGNOSIS — R918 Other nonspecific abnormal finding of lung field: Secondary | ICD-10-CM

## 2015-09-15 DIAGNOSIS — K219 Gastro-esophageal reflux disease without esophagitis: Secondary | ICD-10-CM

## 2015-09-15 DIAGNOSIS — J439 Emphysema, unspecified: Secondary | ICD-10-CM

## 2015-09-15 DIAGNOSIS — I10 Essential (primary) hypertension: Secondary | ICD-10-CM | POA: Diagnosis not present

## 2015-09-15 DIAGNOSIS — F411 Generalized anxiety disorder: Secondary | ICD-10-CM | POA: Diagnosis not present

## 2015-09-15 NOTE — Progress Notes (Signed)
Patient ID: Megan [name], female   DOB: 1951/06/20, 64 y.o.   MRN: [id number]    Nursing Home Location:  [place] of Service: SNF (31)  PCP: Pcp Not In System  Allergies  Allergen Reactions  . Penicillins Rash    Has patient had a PCN reaction causing immediate rash, facial/tongue/throat swelling, SOB or lightheadedness with hypotension: {Yes Has patient had a PCN reaction causing severe rash involving mucus membranes or skin necrosis:NO Has patient had a PCN reaction that required hospitalization {Yes Has patient had a PCN reaction occurring within the last 10 years:NO If all of the above answers are "NO", then may proceed with Cephalosporin use.    Chief Complaint  Patient presents with  . Discharge Note    HPI:  Patient is a 64 y.o. female seen today at [facility] for discharge home. Pt currently at [facility] following hospitalization for  COPD exacerbation with acute respiratory failure, O2 dependent, RML pulm nodule, ST elevation with elevated Trp, HTN. She had CT angio that was neg for PE but revealed pulmonary nodules (follow up with pulmonary scheduled) . 2D echo revealed EF 55-60%. Cardiac cath neg for ischemic changes and it was thought that elevated troponin's due to demand ischemia. She was d/c'd on azithromycin and prednisone. Cough and shortness of breath stable without worsening of symptoms.  Patient currently doing well with therapy, now stable to discharge home with home health.   Review of Systems:  Review of Systems  Constitutional: Negative for activity change, appetite change, fatigue and unexpected weight change.  HENT: Negative for congestion and hearing loss.   Eyes: Negative.   Respiratory: Positive for cough and shortness of breath.        Chronic cough and shortness of breath, without worsening of symptoms.   Cardiovascular: Negative for chest pain, palpitations and leg swelling.  Gastrointestinal: Negative for  abdominal pain, diarrhea and constipation.  Genitourinary: Negative for dysuria and difficulty urinating.  Musculoskeletal: Negative for myalgias and arthralgias.  Skin: Negative for color change and wound.  Neurological: Negative for dizziness and weakness.  Psychiatric/Behavioral: Negative for behavioral problems, confusion and agitation. The patient is nervous/anxious (controlled at this time).     Past Medical History  Diagnosis Date  . Myocardial infarction ([place])   . CHF (congestive heart failure) ([place])   . COPD (chronic obstructive pulmonary disease) ([place])   . Shortness of breath dyspnea   . Depression   . GERD (gastroesophageal reflux disease)   . History of hiatal hernia   . Headache   . Anemia   . Essential hypertension 08/19/2015  . Anxiety state 08/20/2015   Past Surgical History  Procedure Laterality Date  . Cardiac catheterization    . Cardiac catheterization N/A 08/26/2015    Procedure: Left Heart Cath and Coronary Angiography;  Surgeon: Jettie Booze, MD;  Location: [place] [facility];  Service: Cardiovascular;  Laterality: N/A;   Social History:   reports that she quit smoking about 15 years ago. She has never used smokeless tobacco. She reports that she does not drink alcohol or use illicit drugs.  No family history on file.  Medications: Patient's Medications  New Prescriptions   No medications on file  Previous Medications   ACETAMINOPHEN (TYLENOL) 500 MG TABLET    Take 500 mg by mouth every 6 (six) hours as needed for mild pain.   ALBUTEROL (PROVENTIL HFA;VENTOLIN HFA) 108 (90 BASE) MCG/ACT INHALER    Inhale 1  puff into the lungs every 4 (four) hours as needed for wheezing or shortness of breath.   ALPRAZOLAM (XANAX) 0.25 MG TABLET    Take 1 tablet (0.25 mg total) by mouth 2 (two) times daily as needed for anxiety.   ASPIRIN 81 MG CHEWABLE TABLET    Chew 81 mg by mouth daily.   AZITHROMYCIN (ZITHROMAX) 250 MG TABLET    Take 1 tablet (250 mg total)  by mouth daily.   BUSPIRONE (BUSPAR) 7.5 MG TABLET    Take 2 tablets (15 mg total) by mouth 2 (two) times daily.   BUTALBITAL-ACETAMINOPHEN-CAFFEINE (FIORICET) 50-325-40 MG TABLET    Take 1-2 tablets by mouth every 6 (six) hours as needed for headache.   BUTALBITAL-ACETAMINOPHEN-CAFFEINE (FIORICET, ESGIC) 50-325-40 MG TABLET    Take 1 tablet by mouth 2 (two) times daily as needed for headache.   CYPROHEPTADINE (PERIACTIN) 4 MG TABLET    Take 4 mg by mouth daily as needed (appetitie stimulant).   ESCITALOPRAM (LEXAPRO) 20 MG TABLET    Take 2 tablets (40 mg total) by mouth daily.   FLUTICASONE-SALMETEROL (ADVAIR) 500-50 MCG/DOSE AEPB    Inhale 1 puff into the lungs 2 (two) times daily.   IPRATROPIUM-ALBUTEROL (DUONEB) 0.5-2.5 (3) MG/3ML SOLN    Take 3 mLs by nebulization every 6 (six) hours as needed (shortness of breath).   LIDOCAINE (LIDODERM) 5 %    Place 1 patch onto the skin daily as needed (pain). Remove & Discard patch within 12 hours or as directed by MD   LISINOPRIL (PRINIVIL,ZESTRIL) 2.5 MG TABLET    Take 2.5 mg by mouth daily.   MELATONIN 3 MG TABS    Take 1 tablet by mouth at bedtime as needed (sleep).   MULTIPLE VITAMIN (MULTIVITAMIN WITH MINERALS) TABS TABLET    Take 1 tablet by mouth daily.   OMEPRAZOLE (PRILOSEC) 40 MG CAPSULE    Take 40 mg by mouth daily.   PREDNISONE (DELTASONE) 20 MG TABLET    Take 1 tablet (20 mg total) by mouth daily with breakfast.   PROMETHAZINE (PHENERGAN) 12.5 MG TABLET    Take 12.5 mg by mouth every 6 (six) hours as needed for nausea or vomiting.   UMECLIDINIUM BROMIDE (INCRUSE ELLIPTA) 62.5 MCG/INH AEPB    Inhale 1 puff into the lungs daily.  Modified Medications   No medications on file  Discontinued Medications   No medications on file     Physical Exam: Filed Vitals:   09/15/15 1112  BP: 100/60  Pulse: 90  Temp: 98.8 F (37.1 C)  Resp: 20    Physical Exam  Constitutional: She is oriented to person, place, and time. No distress.  Frail  thin female  HENT:  Head: Normocephalic and atraumatic.  Mouth/Throat: Oropharynx is clear and moist. No oropharyngeal exudate.  Eyes: Conjunctivae are normal. Pupils are equal, round, and reactive to light.  Neck: Normal range of motion. Neck supple.  Cardiovascular: Normal rate, regular rhythm and normal heart sounds.   Pulmonary/Chest: Effort normal.  Diminished breath sounds throughout   Abdominal: Soft. Bowel sounds are normal.  Musculoskeletal: She exhibits no edema or tenderness.  Neurological: She is alert and oriented to person, place, and time.  Skin: Skin is warm and dry. She is not diaphoretic.  Psychiatric: She has a normal mood and affect.    Labs reviewed: Basic Metabolic Panel:  Recent Labs  08/26/15 0653 08/27/15 0803 08/28/15 0750  NA 129* 130* 133*  K 4.5 4.5 4.6  CL 86*  88* 88*  CO2 31 34* 39*  GLUCOSE 139* 125* 131*  BUN 28* 21* 13  CREATININE 0.90 0.67 0.61  CALCIUM 8.8* 8.5* 8.7*   Liver Function Tests:  Recent Labs  08/22/15 0330 08/25/15 2116  AST 27 31  ALT 18 21  ALKPHOS 71 67  BILITOT 0.8 0.9  PROT 6.8 7.1  ALBUMIN 3.5 4.1   No results for input(s): LIPASE, AMYLASE in the last 8760 hours. No results for input(s): AMMONIA in the last 8760 hours. CBC:  Recent Labs  08/18/15 2121  08/25/15 2116 08/25/15 2124 08/26/15 1050 08/27/15 0130  WBC 9.2  < > 9.9  --  6.9 11.2*  NEUTROABS 6.2  --  6.5  --   --   --   HGB 12.7  < > 15.2* 16.7* 13.7 12.6  HCT 36.8  < > 43.1 49.0* 40.7 38.0  MCV 95.3  < > 93.1  --  94.0 95.7  PLT 329  < > PLATELET CLUMPS NOTED ON SMEAR, COUNT APPEARS ADEQUATE  --  642* 507*  < > = values in this interval not displayed. TSH: No results for input(s): TSH in the last 8760 hours. A1C: No results found for: HGBA1C Lipid Panel: No results for input(s): CHOL, HDL, LDLCALC, TRIG, CHOLHDL, LDLDIRECT in the last 8760 hours.  Radiological Exams: Dg Chest Portable 1 View  08/25/2015  CLINICAL DATA:  Acute  onset of shortness of breath. Initial encounter. EXAM: PORTABLE CHEST 1 VIEW COMPARISON:  Chest radiograph performed 08/18/2015 FINDINGS: The lungs are well-aerated and clear. There is no evidence of focal opacification, pleural effusion or pneumothorax. Bilateral nipple shadows are seen. The cardiomediastinal silhouette is within normal limits. No acute osseous abnormalities are seen. IMPRESSION: No acute cardiopulmonary process seen. Electronically Signed   By: Garald Balding M.D.   On: 08/25/2015 21:16    Assessment/Plan 1. Pulmonary emphysema, unspecified emphysema type ([place]) COPD stable at this time, reviewed epics notes and will stop azithroymin at this time. Appears prednisone needs to be tapered slowly and stopped, will decrease to 15 mg daily at this time and then on 09/19/15 start 10 mg daily for 1 week then to DC -has follow up with pulmonary on 11/18 -conts on inhalers and nebs and long term O2  2. Anxiety state -stable at this time, conts on lexapro, buspar and xanax as needed  3. Multiple pulmonary nodules determined by computed tomography of lung -follow up with pulmonary scheduled  4. Protein-calorie malnutrition, severe -has gained 4 lbs while at [employer]. conts supplements on discharge  5. Gastroesophageal reflux disease, esophagitis presence not specified -conts on omeprazole 40 mg daily  6. Hypertension -blood pressure well controlled on lisinopril.    pt is stable for discharge-will need PT/OT per home health. DME needed includes 3n1, WC, tub bench, wedge pillow. Rx written.  will need to follow up with PCP within 2 weeks.     Carlos American. Harle Battiest  [facility] [phone] 8 am - 5 pm) [phone] (after hours)

## 2015-09-19 ENCOUNTER — Institutional Professional Consult (permissible substitution): Payer: Medicare Other | Admitting: Pulmonary Disease

## 2015-09-19 ENCOUNTER — Telehealth: Payer: Self-pay | Admitting: *Deleted

## 2015-09-19 NOTE — Telephone Encounter (Signed)
Called to get verification, script was not signed.

## 2015-09-22 ENCOUNTER — Ambulatory Visit: Payer: Medicare Other | Admitting: Internal Medicine

## 2015-09-22 ENCOUNTER — Encounter: Payer: Self-pay | Admitting: Internal Medicine

## 2015-10-19 DIAGNOSIS — F419 Anxiety disorder, unspecified: Secondary | ICD-10-CM | POA: Diagnosis not present

## 2015-10-19 DIAGNOSIS — R6889 Other general symptoms and signs: Secondary | ICD-10-CM | POA: Diagnosis not present

## 2015-12-01 ENCOUNTER — Other Ambulatory Visit: Payer: Self-pay | Admitting: Nurse Practitioner

## 2016-01-12 ENCOUNTER — Other Ambulatory Visit: Payer: Self-pay | Admitting: Nurse Practitioner

## 2016-01-13 DIAGNOSIS — H43813 Vitreous degeneration, bilateral: Secondary | ICD-10-CM | POA: Diagnosis not present

## 2016-02-06 ENCOUNTER — Other Ambulatory Visit: Payer: Self-pay | Admitting: Nurse Practitioner

## 2016-02-10 DIAGNOSIS — H25813 Combined forms of age-related cataract, bilateral: Secondary | ICD-10-CM | POA: Diagnosis not present

## 2016-02-10 DIAGNOSIS — H04123 Dry eye syndrome of bilateral lacrimal glands: Secondary | ICD-10-CM | POA: Diagnosis not present

## 2016-02-10 DIAGNOSIS — H40003 Preglaucoma, unspecified, bilateral: Secondary | ICD-10-CM | POA: Diagnosis not present

## 2016-02-12 ENCOUNTER — Inpatient Hospital Stay (HOSPITAL_COMMUNITY)
Admission: EM | Admit: 2016-02-12 | Discharge: 2016-02-21 | DRG: 208 | Disposition: A | Discharge status: Home / Self Care | Payer: Medicare Other | Attending: Internal Medicine | Admitting: Internal Medicine

## 2016-02-12 ENCOUNTER — Encounter (HOSPITAL_COMMUNITY): Payer: Self-pay | Admitting: Vascular Surgery

## 2016-02-12 ENCOUNTER — Emergency Department (HOSPITAL_COMMUNITY): Payer: Medicare Other

## 2016-02-12 DIAGNOSIS — I10 Essential (primary) hypertension: Secondary | ICD-10-CM | POA: Diagnosis not present

## 2016-02-12 DIAGNOSIS — J440 Chronic obstructive pulmonary disease with acute lower respiratory infection: Principal | ICD-10-CM | POA: Diagnosis present

## 2016-02-12 DIAGNOSIS — J9622 Acute and chronic respiratory failure with hypercapnia: Secondary | ICD-10-CM | POA: Diagnosis present

## 2016-02-12 DIAGNOSIS — G43909 Migraine, unspecified, not intractable, without status migrainosus: Secondary | ICD-10-CM | POA: Diagnosis present

## 2016-02-12 DIAGNOSIS — Z9981 Dependence on supplemental oxygen: Secondary | ICD-10-CM | POA: Diagnosis not present

## 2016-02-12 DIAGNOSIS — Z6820 Body mass index (BMI) 20.0-20.9, adult: Secondary | ICD-10-CM | POA: Diagnosis not present

## 2016-02-12 DIAGNOSIS — R6510 Systemic inflammatory response syndrome (SIRS) of non-infectious origin without acute organ dysfunction: Secondary | ICD-10-CM | POA: Diagnosis present

## 2016-02-12 DIAGNOSIS — R112 Nausea with vomiting, unspecified: Secondary | ICD-10-CM

## 2016-02-12 DIAGNOSIS — R000 Tachycardia, unspecified: Secondary | ICD-10-CM | POA: Diagnosis present

## 2016-02-12 DIAGNOSIS — E860 Dehydration: Secondary | ICD-10-CM | POA: Diagnosis present

## 2016-02-12 DIAGNOSIS — K5900 Constipation, unspecified: Secondary | ICD-10-CM | POA: Diagnosis not present

## 2016-02-12 DIAGNOSIS — F411 Generalized anxiety disorder: Secondary | ICD-10-CM | POA: Diagnosis present

## 2016-02-12 DIAGNOSIS — K219 Gastro-esophageal reflux disease without esophagitis: Secondary | ICD-10-CM | POA: Diagnosis present

## 2016-02-12 DIAGNOSIS — Z8614 Personal history of Methicillin resistant Staphylococcus aureus infection: Secondary | ICD-10-CM

## 2016-02-12 DIAGNOSIS — Z7952 Long term (current) use of systemic steroids: Secondary | ICD-10-CM | POA: Diagnosis not present

## 2016-02-12 DIAGNOSIS — J9601 Acute respiratory failure with hypoxia: Secondary | ICD-10-CM | POA: Diagnosis not present

## 2016-02-12 DIAGNOSIS — Z818 Family history of other mental and behavioral disorders: Secondary | ICD-10-CM | POA: Diagnosis not present

## 2016-02-12 DIAGNOSIS — F419 Anxiety disorder, unspecified: Secondary | ICD-10-CM | POA: Diagnosis present

## 2016-02-12 DIAGNOSIS — R0902 Hypoxemia: Secondary | ICD-10-CM | POA: Diagnosis not present

## 2016-02-12 DIAGNOSIS — Z452 Encounter for adjustment and management of vascular access device: Secondary | ICD-10-CM | POA: Diagnosis not present

## 2016-02-12 DIAGNOSIS — J9621 Acute and chronic respiratory failure with hypoxia: Secondary | ICD-10-CM | POA: Diagnosis present

## 2016-02-12 DIAGNOSIS — Z8249 Family history of ischemic heart disease and other diseases of the circulatory system: Secondary | ICD-10-CM

## 2016-02-12 DIAGNOSIS — I509 Heart failure, unspecified: Secondary | ICD-10-CM | POA: Diagnosis present

## 2016-02-12 DIAGNOSIS — K210 Gastro-esophageal reflux disease with esophagitis: Secondary | ICD-10-CM | POA: Diagnosis not present

## 2016-02-12 DIAGNOSIS — R05 Cough: Secondary | ICD-10-CM | POA: Diagnosis not present

## 2016-02-12 DIAGNOSIS — E874 Mixed disorder of acid-base balance: Secondary | ICD-10-CM | POA: Diagnosis present

## 2016-02-12 DIAGNOSIS — Z79899 Other long term (current) drug therapy: Secondary | ICD-10-CM

## 2016-02-12 DIAGNOSIS — I252 Old myocardial infarction: Secondary | ICD-10-CM | POA: Diagnosis not present

## 2016-02-12 DIAGNOSIS — G4700 Insomnia, unspecified: Secondary | ICD-10-CM | POA: Diagnosis present

## 2016-02-12 DIAGNOSIS — J9600 Acute respiratory failure, unspecified whether with hypoxia or hypercapnia: Secondary | ICD-10-CM | POA: Diagnosis not present

## 2016-02-12 DIAGNOSIS — R059 Cough, unspecified: Secondary | ICD-10-CM

## 2016-02-12 DIAGNOSIS — R64 Cachexia: Secondary | ICD-10-CM | POA: Diagnosis not present

## 2016-02-12 DIAGNOSIS — J441 Chronic obstructive pulmonary disease with (acute) exacerbation: Secondary | ICD-10-CM | POA: Diagnosis not present

## 2016-02-12 DIAGNOSIS — Z7982 Long term (current) use of aspirin: Secondary | ICD-10-CM | POA: Diagnosis not present

## 2016-02-12 DIAGNOSIS — Z22322 Carrier or suspected carrier of Methicillin resistant Staphylococcus aureus: Secondary | ICD-10-CM | POA: Diagnosis not present

## 2016-02-12 DIAGNOSIS — I110 Hypertensive heart disease with heart failure: Secondary | ICD-10-CM | POA: Diagnosis present

## 2016-02-12 DIAGNOSIS — J189 Pneumonia, unspecified organism: Secondary | ICD-10-CM | POA: Diagnosis present

## 2016-02-12 DIAGNOSIS — A419 Sepsis, unspecified organism: Secondary | ICD-10-CM | POA: Diagnosis not present

## 2016-02-12 DIAGNOSIS — R0602 Shortness of breath: Secondary | ICD-10-CM | POA: Diagnosis not present

## 2016-02-12 DIAGNOSIS — E872 Acidosis: Secondary | ICD-10-CM | POA: Diagnosis present

## 2016-02-12 DIAGNOSIS — Z7951 Long term (current) use of inhaled steroids: Secondary | ICD-10-CM | POA: Diagnosis not present

## 2016-02-12 DIAGNOSIS — F4312 Post-traumatic stress disorder, chronic: Secondary | ICD-10-CM | POA: Diagnosis not present

## 2016-02-12 DIAGNOSIS — R911 Solitary pulmonary nodule: Secondary | ICD-10-CM | POA: Diagnosis present

## 2016-02-12 DIAGNOSIS — J9602 Acute respiratory failure with hypercapnia: Secondary | ICD-10-CM | POA: Diagnosis not present

## 2016-02-12 DIAGNOSIS — E43 Unspecified severe protein-calorie malnutrition: Secondary | ICD-10-CM | POA: Diagnosis present

## 2016-02-12 DIAGNOSIS — R918 Other nonspecific abnormal finding of lung field: Secondary | ICD-10-CM | POA: Diagnosis present

## 2016-02-12 DIAGNOSIS — I959 Hypotension, unspecified: Secondary | ICD-10-CM | POA: Diagnosis not present

## 2016-02-12 DIAGNOSIS — Z87891 Personal history of nicotine dependence: Secondary | ICD-10-CM | POA: Diagnosis not present

## 2016-02-12 DIAGNOSIS — Z880 Allergy status to penicillin: Secondary | ICD-10-CM

## 2016-02-12 DIAGNOSIS — J13 Pneumonia due to Streptococcus pneumoniae: Secondary | ICD-10-CM | POA: Diagnosis present

## 2016-02-12 DIAGNOSIS — J9811 Atelectasis: Secondary | ICD-10-CM | POA: Diagnosis not present

## 2016-02-12 HISTORY — DX: Pneumonia, unspecified organism: J18.9

## 2016-02-12 LAB — CBC
HEMATOCRIT: 37.6 % (ref 36.0–46.0)
HEMOGLOBIN: 12.3 g/dL (ref 12.0–15.0)
MCH: 31.5 pg (ref 26.0–34.0)
MCHC: 32.7 g/dL (ref 30.0–36.0)
MCV: 96.4 fL (ref 78.0–100.0)
PLATELETS: 264 10*3/uL (ref 150–400)
RBC: 3.9 MIL/uL (ref 3.87–5.11)
RDW: 12.1 % (ref 11.5–15.5)
WBC: 20.6 10*3/uL — AB (ref 4.0–10.5)

## 2016-02-12 LAB — COMPREHENSIVE METABOLIC PANEL
ALBUMIN: 4 g/dL (ref 3.5–5.0)
ALT: 16 U/L (ref 14–54)
AST: 28 U/L (ref 15–41)
Alkaline Phosphatase: 69 U/L (ref 38–126)
Anion gap: 15 (ref 5–15)
BUN: 7 mg/dL (ref 6–20)
CHLORIDE: 99 mmol/L — AB (ref 101–111)
CO2: 25 mmol/L (ref 22–32)
CREATININE: 0.69 mg/dL (ref 0.44–1.00)
Calcium: 9.1 mg/dL (ref 8.9–10.3)
GFR calc Af Amer: >60 mL/min (ref >60)
GLUCOSE: 174 mg/dL — AB (ref 65–99)
Potassium: 4.4 mmol/L (ref 3.5–5.1)
Sodium: 139 mmol/L (ref 135–145)
Total Bilirubin: 0.8 mg/dL (ref 0.3–1.2)
Total Protein: 6.7 g/dL (ref 6.5–8.1)

## 2016-02-12 LAB — CBC WITH DIFFERENTIAL/PLATELET
BASOS ABS: 0 10*3/uL (ref 0.0–0.1)
Basophils Relative: 0 %
EOS ABS: 0 10*3/uL (ref 0.0–0.7)
Eosinophils Relative: 0 %
HCT: 43.9 % (ref 36.0–46.0)
HEMOGLOBIN: 14.8 g/dL (ref 12.0–15.0)
LYMPHS PCT: 9 %
Lymphs Abs: 2.8 10*3/uL (ref 0.7–4.0)
MCH: 32.6 pg (ref 26.0–34.0)
MCHC: 33.7 g/dL (ref 30.0–36.0)
MCV: 96.7 fL (ref 78.0–100.0)
MONOS PCT: 8 %
Monocytes Absolute: 2.5 10*3/uL — ABNORMAL HIGH (ref 0.1–1.0)
NEUTROS PCT: 83 %
Neutro Abs: 25.7 10*3/uL — ABNORMAL HIGH (ref 1.7–7.7)
Platelets: UNDETERMINED 10*3/uL (ref 150–400)
RBC: 4.54 MIL/uL (ref 3.87–5.11)
RDW: 12.2 % (ref 11.5–15.5)
WBC: 31 10*3/uL — ABNORMAL HIGH (ref 4.0–10.5)

## 2016-02-12 LAB — URINE MICROSCOPIC-ADD ON

## 2016-02-12 LAB — CREATININE, SERUM
Creatinine, Ser: 0.7 mg/dL (ref 0.44–1.00)
GFR calc non Af Amer: >60 mL/min (ref >60)

## 2016-02-12 LAB — URINALYSIS, ROUTINE W REFLEX MICROSCOPIC
BILIRUBIN URINE: NEGATIVE
GLUCOSE, UA: 500 mg/dL — AB
KETONES UR: NEGATIVE mg/dL
Leukocytes, UA: NEGATIVE
Nitrite: NEGATIVE
PROTEIN: 100 mg/dL — AB
Specific Gravity, Urine: 1.02 (ref 1.005–1.030)
pH: 6 (ref 5.0–8.0)

## 2016-02-12 LAB — INFLUENZA PANEL BY PCR (TYPE A & B)
H1N1FLUPCR: NOT DETECTED
INFLBPCR: NEGATIVE
Influenza A By PCR: NEGATIVE

## 2016-02-12 LAB — I-STAT ARTERIAL BLOOD GAS, ED
Acid-Base Excess: 4 mmol/L — ABNORMAL HIGH (ref 0.0–2.0)
BICARBONATE: 32.4 meq/L — AB (ref 20.0–24.0)
O2 SAT: 95 %
PH ART: 7.292 — AB (ref 7.350–7.450)
Patient temperature: 98.6
TCO2: 34 mmol/L (ref 0–100)
pCO2 arterial: 67.1 mmHg (ref 35.0–45.0)
pO2, Arterial: 86 mmHg (ref 80.0–100.0)

## 2016-02-12 LAB — LACTIC ACID, PLASMA: Lactic Acid, Venous: 1.9 mmol/L (ref 0.5–2.0)

## 2016-02-12 LAB — MRSA PCR SCREENING: MRSA BY PCR: POSITIVE — AB

## 2016-02-12 LAB — I-STAT CG4 LACTIC ACID, ED
LACTIC ACID, VENOUS: 3.45 mmol/L — AB (ref 0.5–2.0)
Lactic Acid, Venous: 2.8 mmol/L (ref 0.5–2.0)

## 2016-02-12 LAB — PROCALCITONIN: Procalcitonin: 1.99 ng/mL

## 2016-02-12 LAB — I-STAT TROPONIN, ED: TROPONIN I, POC: 0 ng/mL (ref 0.00–0.08)

## 2016-02-12 LAB — BRAIN NATRIURETIC PEPTIDE: B NATRIURETIC PEPTIDE 5: 132.4 pg/mL — AB (ref 0.0–100.0)

## 2016-02-12 MED ORDER — ONDANSETRON HCL 4 MG/2ML IJ SOLN
4.0000 mg | Freq: Once | INTRAMUSCULAR | Status: AC
  Administered 2016-02-12: 4 mg via INTRAVENOUS
  Filled 2016-02-12: qty 2

## 2016-02-12 MED ORDER — OSELTAMIVIR PHOSPHATE 75 MG PO CAPS
75.0000 mg | ORAL_CAPSULE | Freq: Once | ORAL | Status: DC
  Filled 2016-02-12: qty 1

## 2016-02-12 MED ORDER — ESCITALOPRAM OXALATE 20 MG PO TABS
40.0000 mg | ORAL_TABLET | Freq: Every day | ORAL | Status: DC
  Administered 2016-02-12 – 2016-02-21 (×10): 40 mg via ORAL
  Filled 2016-02-12: qty 4
  Filled 2016-02-12 (×4): qty 2
  Filled 2016-02-12: qty 4
  Filled 2016-02-12 (×3): qty 2
  Filled 2016-02-12: qty 4
  Filled 2016-02-12: qty 2

## 2016-02-12 MED ORDER — LEVOFLOXACIN IN D5W 750 MG/150ML IV SOLN
750.0000 mg | Freq: Once | INTRAVENOUS | Status: AC
  Administered 2016-02-12: 750 mg via INTRAVENOUS
  Filled 2016-02-12: qty 150

## 2016-02-12 MED ORDER — ACETAMINOPHEN 650 MG RE SUPP
650.0000 mg | Freq: Four times a day (QID) | RECTAL | Status: DC | PRN
  Administered 2016-02-16: 650 mg via RECTAL
  Filled 2016-02-12: qty 1

## 2016-02-12 MED ORDER — ENOXAPARIN SODIUM 40 MG/0.4ML ~~LOC~~ SOLN
40.0000 mg | Freq: Every day | SUBCUTANEOUS | Status: DC
  Administered 2016-02-12 – 2016-02-20 (×9): 40 mg via SUBCUTANEOUS
  Filled 2016-02-12 (×9): qty 0.4

## 2016-02-12 MED ORDER — CETYLPYRIDINIUM CHLORIDE 0.05 % MT LIQD
7.0000 mL | Freq: Two times a day (BID) | OROMUCOSAL | Status: DC
  Administered 2016-02-13 – 2016-02-15 (×4): 7 mL via OROMUCOSAL

## 2016-02-12 MED ORDER — MUPIROCIN 2 % EX OINT
1.0000 "application " | TOPICAL_OINTMENT | Freq: Two times a day (BID) | CUTANEOUS | Status: AC
  Administered 2016-02-13 – 2016-02-17 (×10): 1 via NASAL
  Filled 2016-02-12 (×4): qty 22

## 2016-02-12 MED ORDER — ALBUTEROL SULFATE (2.5 MG/3ML) 0.083% IN NEBU
2.5000 mg | INHALATION_SOLUTION | Freq: Four times a day (QID) | RESPIRATORY_TRACT | Status: DC
  Filled 2016-02-12: qty 3

## 2016-02-12 MED ORDER — ACETAMINOPHEN 325 MG PO TABS
650.0000 mg | ORAL_TABLET | Freq: Four times a day (QID) | ORAL | Status: DC | PRN
  Administered 2016-02-14: 650 mg via ORAL
  Filled 2016-02-12: qty 2

## 2016-02-12 MED ORDER — VANCOMYCIN HCL IN DEXTROSE 1-5 GM/200ML-% IV SOLN: 1000.0000 mg | Freq: Once | INTRAVENOUS | Status: DC

## 2016-02-12 MED ORDER — CHLORHEXIDINE GLUCONATE CLOTH 2 % EX PADS
6.0000 | MEDICATED_PAD | Freq: Every day | CUTANEOUS | Status: AC
  Administered 2016-02-13 – 2016-02-17 (×5): 6 via TOPICAL

## 2016-02-12 MED ORDER — PREDNISONE 20 MG PO TABS
40.0000 mg | ORAL_TABLET | Freq: Every day | ORAL | Status: DC
Start: 2016-02-13 — End: 2016-02-15
  Administered 2016-02-13 – 2016-02-15 (×3): 40 mg via ORAL
  Filled 2016-02-12 (×3): qty 2

## 2016-02-12 MED ORDER — ONDANSETRON HCL 4 MG/2ML IJ SOLN
4.0000 mg | Freq: Four times a day (QID) | INTRAMUSCULAR | Status: DC | PRN
Start: 2016-02-12 — End: 2016-02-21
  Administered 2016-02-16 – 2016-02-20 (×2): 4 mg via INTRAVENOUS
  Filled 2016-02-12 (×3): qty 2

## 2016-02-12 MED ORDER — LEVOFLOXACIN IN D5W 750 MG/150ML IV SOLN: 750.0000 mg | INTRAVENOUS | Status: DC

## 2016-02-12 MED ORDER — UMECLIDINIUM BROMIDE 62.5 MCG/INH IN AEPB
1.0000 | INHALATION_SPRAY | Freq: Every day | RESPIRATORY_TRACT | Status: DC
Start: 2016-02-12 — End: 2016-02-17
  Administered 2016-02-13 – 2016-02-14 (×2): 1 via RESPIRATORY_TRACT
  Filled 2016-02-12: qty 7

## 2016-02-12 MED ORDER — SENNOSIDES-DOCUSATE SODIUM 8.6-50 MG PO TABS
1.0000 | ORAL_TABLET | Freq: Every evening | ORAL | Status: DC | PRN
  Filled 2016-02-12 (×2): qty 1

## 2016-02-12 MED ORDER — OSELTAMIVIR PHOSPHATE 30 MG PO CAPS
30.0000 mg | ORAL_CAPSULE | Freq: Once | ORAL | Status: AC
  Administered 2016-02-12: 30 mg via ORAL
  Filled 2016-02-12: qty 1

## 2016-02-12 MED ORDER — SODIUM CHLORIDE 0.9 % IV SOLN
INTRAVENOUS | Status: DC
  Administered 2016-02-12 – 2016-02-14 (×3): via INTRAVENOUS

## 2016-02-12 MED ORDER — DEXTROSE 5 % IV SOLN
1.0000 g | INTRAVENOUS | Status: AC
  Administered 2016-02-13 – 2016-02-19 (×7): 1 g via INTRAVENOUS
  Filled 2016-02-12 (×7): qty 10

## 2016-02-12 MED ORDER — LORAZEPAM 2 MG/ML IJ SOLN
0.5000 mg | Freq: Once | INTRAMUSCULAR | Status: AC
  Administered 2016-02-12: 0.5 mg via INTRAVENOUS
  Filled 2016-02-12: qty 1

## 2016-02-12 MED ORDER — VANCOMYCIN HCL IN DEXTROSE 750-5 MG/150ML-% IV SOLN
750.0000 mg | INTRAVENOUS | Status: DC
  Administered 2016-02-13 – 2016-02-14 (×2): 750 mg via INTRAVENOUS
  Filled 2016-02-12 (×3): qty 150

## 2016-02-12 MED ORDER — MOMETASONE FURO-FORMOTEROL FUM 200-5 MCG/ACT IN AERO
2.0000 | INHALATION_SPRAY | Freq: Two times a day (BID) | RESPIRATORY_TRACT | Status: DC
  Administered 2016-02-13 – 2016-02-15 (×5): 2 via RESPIRATORY_TRACT
  Filled 2016-02-12 (×2): qty 8.8

## 2016-02-12 MED ORDER — AZITHROMYCIN 500 MG PO TABS
500.0000 mg | ORAL_TABLET | ORAL | Status: DC
  Administered 2016-02-13 – 2016-02-14 (×2): 500 mg via ORAL
  Filled 2016-02-12 (×3): qty 1

## 2016-02-12 MED ORDER — ENSURE ENLIVE PO LIQD: 237.0000 mL | Freq: Two times a day (BID) | ORAL | Status: DC

## 2016-02-12 MED ORDER — ONDANSETRON HCL 4 MG PO TABS
4.0000 mg | ORAL_TABLET | Freq: Four times a day (QID) | ORAL | Status: DC | PRN
  Administered 2016-02-19: 4 mg via ORAL
  Filled 2016-02-12: qty 1

## 2016-02-12 MED ORDER — SODIUM CHLORIDE 0.9% FLUSH
3.0000 mL | Freq: Two times a day (BID) | INTRAVENOUS | Status: DC
  Administered 2016-02-12 – 2016-02-21 (×14): 3 mL via INTRAVENOUS

## 2016-02-12 MED ORDER — SODIUM CHLORIDE 0.9 % IV BOLUS (SEPSIS)
1000.0000 mL | Freq: Once | INTRAVENOUS | Status: AC
  Administered 2016-02-12: 1000 mL via INTRAVENOUS

## 2016-02-12 MED ORDER — CHLORHEXIDINE GLUCONATE 0.12 % MT SOLN
15.0000 mL | Freq: Two times a day (BID) | OROMUCOSAL | Status: DC
  Administered 2016-02-12 – 2016-02-14 (×4): 15 mL via OROMUCOSAL
  Filled 2016-02-12 (×5): qty 15

## 2016-02-12 MED ORDER — HYDROCODONE-ACETAMINOPHEN 5-325 MG PO TABS
1.0000 | ORAL_TABLET | ORAL | Status: DC | PRN
  Administered 2016-02-12 – 2016-02-14 (×6): 2 via ORAL
  Filled 2016-02-12 (×6): qty 2

## 2016-02-12 MED ORDER — ALPRAZOLAM 0.25 MG PO TABS
0.2500 mg | ORAL_TABLET | Freq: Two times a day (BID) | ORAL | Status: DC | PRN
  Administered 2016-02-12 – 2016-02-17 (×6): 0.25 mg via ORAL
  Filled 2016-02-12 (×6): qty 1

## 2016-02-12 MED ORDER — BUSPIRONE HCL 15 MG PO TABS
15.0000 mg | ORAL_TABLET | Freq: Two times a day (BID) | ORAL | Status: DC
  Administered 2016-02-12 – 2016-02-21 (×17): 15 mg via ORAL
  Filled 2016-02-12 (×18): qty 1

## 2016-02-12 MED ORDER — LORAZEPAM 1 MG PO TABS: 0.5000 mg | ORAL_TABLET | Freq: Once | ORAL | Status: DC

## 2016-02-12 MED ORDER — MELATONIN 3 MG PO TABS
1.0000 | ORAL_TABLET | Freq: Every evening | ORAL | Status: DC | PRN
  Filled 2016-02-12: qty 1

## 2016-02-12 MED ORDER — ALBUTEROL SULFATE (2.5 MG/3ML) 0.083% IN NEBU
2.5000 mg | INHALATION_SOLUTION | RESPIRATORY_TRACT | Status: DC | PRN
  Administered 2016-02-12: 2.5 mg via RESPIRATORY_TRACT

## 2016-02-12 MED ORDER — BUTALBITAL-APAP-CAFFEINE 50-325-40 MG PO TABS
1.0000 | ORAL_TABLET | Freq: Two times a day (BID) | ORAL | Status: DC | PRN
  Administered 2016-02-15: 1 via ORAL
  Filled 2016-02-12: qty 1

## 2016-02-12 MED ORDER — SODIUM CHLORIDE 0.9 % IV BOLUS (SEPSIS)
500.0000 mL | INTRAVENOUS | Status: AC
  Administered 2016-02-12: 500 mL via INTRAVENOUS

## 2016-02-12 MED ORDER — PANTOPRAZOLE SODIUM 40 MG PO TBEC
80.0000 mg | DELAYED_RELEASE_TABLET | Freq: Every day | ORAL | Status: DC
  Administered 2016-02-12 – 2016-02-15 (×4): 80 mg via ORAL
  Filled 2016-02-12 (×4): qty 2

## 2016-02-12 MED ORDER — ASPIRIN 81 MG PO CHEW
81.0000 mg | CHEWABLE_TABLET | Freq: Every day | ORAL | Status: DC
  Administered 2016-02-12 – 2016-02-21 (×10): 81 mg via ORAL
  Filled 2016-02-12 (×10): qty 1

## 2016-02-12 MED ORDER — OSELTAMIVIR PHOSPHATE 75 MG PO CAPS
75.0000 mg | ORAL_CAPSULE | Freq: Two times a day (BID) | ORAL | Status: DC
  Administered 2016-02-13: 75 mg via ORAL
  Filled 2016-02-12 (×2): qty 1

## 2016-02-12 MED ORDER — VANCOMYCIN HCL IN DEXTROSE 750-5 MG/150ML-% IV SOLN
750.0000 mg | INTRAVENOUS | Status: AC
  Administered 2016-02-12: 750 mg via INTRAVENOUS
  Filled 2016-02-12: qty 150

## 2016-02-12 NOTE — ED Notes (Addendum)
2nd IV placed. admitting hospitalist at [facility]. Pt alert, NAD, calm, interactive, resps e/u, speaking in clear complete sentences at rest, DOE noted, skin W&D, VSS, tachycardic, HR 143. IVF bolus continues. C/o sob and nausea.

## 2016-02-12 NOTE — ED Notes (Signed)
Pt reports to the ED for eval of SOB, subjective sensation of fevers, and cough x 2 days. SOB became much worse today. Lung sounds significantly decreased en route. Pt received 10 mg of Albuterol total, 0.5 of atrovent, and 125 mg of Solumedrol. Pt also has a hx of anxiety but is out of her Xanax. Does not have a PCP. Also reports some chest tightness. Pt A&Ox4. She is tachypnic and in the tripod position. Skin warm and dry.

## 2016-02-12 NOTE — H&P (Signed)
History and Physical  Patient Name: Megan [name]     QVZ:[id number]    DOB: 08/16/1951    DOA: 02/12/2016 Referring physician: Rockne Menghini PCP: Pcp Not In System  Dr. Janace Aris from [facility]     Chief Complaint: Shortness of breath  HPI: Megan [name] is a 65 y.o. female with a past medical history significant for COPD stage 4, FEV1 21% on home O2, HTN, and PTSD who presents with shortness of breath and fever.  Reports that she was in her usual state of health until about 3 days ago when she developed shortness of breath, "I couldn't breathe", fevers chills, migraine, fevers, and worse cough. She tried calming herself, breathing cold air, and her rescue inhalers, but her symptoms got worse over the following 2 days until today when she came to the ER. She has grandchildren who have been sick with bronchitis.  In the ED, she was febrile to 100 2.57F, tachycardic to 155, and hypoxic requiring supplemental oxygen with oxygen mask. ABG showed pH 7.2 pCO2 67 bicarb 32, Cr 0.7, WBC 31K, lactate 2.8, BNP normal.  CXR showed no pneumonia.  ECG showed a sinus tachycardia with rate 150.  BiPAP was placed given respiratory distress and acidosis. CODE SEPSIS was called, cultures were obtained, she was administered fluids and vancomycin/levofloxacin for presumed pneumonia, and TRH was asked to admission.  The patient has no home, and lives with daughter and friends intermittently. [address]). Her last visit to her PCP was June 2016. She reports that she still has supplies of her inhalers, but is out of alprazolam, and no longer takes daily azithromycin. She reports that she does use home oxygen. She has severe PTSD after watching her son committed suicide 2 years ago.   Review of Systems:  All other systems negative except as just noted or noted in the history of present illness.  Allergies  Allergen Reactions  . Penicillins Rash    Has patient had a PCN reaction causing immediate rash,  facial/tongue/throat swelling, SOB or lightheadedness with hypotension: No Has patient had a PCN reaction causing severe rash involving mucus membranes or skin necrosis:NO Has patient had a PCN reaction that required hospitalization No Has patient had a PCN reaction occurring within the last 10 years:NO If all of the above answers are "NO", then may proceed with Cephalosporin use.    Prior to Admission medications   Medication Sig Start Date End Date Taking? Authorizing Provider  acetaminophen (TYLENOL) 500 MG tablet Take 500 mg by mouth every 6 (six) hours as needed for mild pain.    Historical Provider, MD  albuterol (PROVENTIL HFA;VENTOLIN HFA) 108 (90 BASE) MCG/ACT inhaler Inhale 1 puff into the lungs every 4 (four) hours as needed for wheezing or shortness of breath. 08/22/15   Iline Oven, MD  ALPRAZolam Duanne Moron) 0.25 MG tablet Take 1 tablet (0.25 mg total) by mouth 2 (two) times daily as needed for anxiety. 08/29/15   Juluis Mire, MD  aspirin 81 MG chewable tablet Chew 81 mg by mouth daily.    Historical Provider, MD  azithromycin (ZITHROMAX) 250 MG tablet Take 1 tablet (250 mg total) by mouth daily. 08/29/15   Juluis Mire, MD  busPIRone (BUSPAR) 7.5 MG tablet Take 2 tablets (15 mg total) by mouth 2 (two) times daily. 08/29/15   Juluis Mire, MD  butalbital-acetaminophen-caffeine (FIORICET) 50-325-40 MG tablet Take 1-2 tablets by mouth every 6 (six) hours as needed for headache. 08/22/15 08/21/16  Paulino Door  Lovena Le, MD  butalbital-acetaminophen-caffeine (FIORICET, ESGIC) [phone] MG tablet Take 1 tablet by mouth 2 (two) times daily as needed for headache. 08/29/15   Juluis Mire, MD  cyproheptadine (PERIACTIN) 4 MG tablet Take 4 mg by mouth daily as needed (appetitie stimulant).    Historical Provider, MD  escitalopram (LEXAPRO) 20 MG tablet Take 2 tablets (40 mg total) by mouth daily. 08/29/15   Juluis Mire, MD  Fluticasone-Salmeterol (ADVAIR) 500-50 MCG/DOSE AEPB Inhale  1 puff into the lungs 2 (two) times daily. 08/22/15   Iline Oven, MD  ipratropium-albuterol (DUONEB) 0.5-2.5 (3) MG/3ML SOLN Take 3 mLs by nebulization every 6 (six) hours as needed (shortness of breath).    Historical Provider, MD  lidocaine (LIDODERM) 5 % Place 1 patch onto the skin daily as needed (pain). Remove & Discard patch within 12 hours or as directed by MD    Historical Provider, MD  lisinopril (PRINIVIL,ZESTRIL) 2.5 MG tablet Take 2.5 mg by mouth daily.    Historical Provider, MD  Melatonin 3 MG TABS Take 1 tablet by mouth at bedtime as needed (sleep).    Historical Provider, MD  Multiple Vitamin (MULTIVITAMIN WITH MINERALS) TABS tablet Take 1 tablet by mouth daily.    Historical Provider, MD  omeprazole (PRILOSEC) 40 MG capsule Take 40 mg by mouth daily.    Historical Provider, MD  predniSONE (DELTASONE) 20 MG tablet Take 1 tablet (20 mg total) by mouth daily with breakfast. 08/29/15   Juluis Mire, MD  promethazine (PHENERGAN) 12.5 MG tablet Take 12.5 mg by mouth every 6 (six) hours as needed for nausea or vomiting.    Historical Provider, MD  Umeclidinium Bromide (INCRUSE ELLIPTA) 62.5 MCG/INH AEPB Inhale 1 puff into the lungs daily. 08/22/15   Iline Oven, MD    Past Medical History  Diagnosis Date  . Myocardial infarction ([place])   . CHF (congestive heart failure) ([place])   . COPD (chronic obstructive pulmonary disease) ([place])   . Shortness of breath dyspnea   . Depression   . GERD (gastroesophageal reflux disease)   . History of hiatal hernia   . Headache   . Anemia   . Essential hypertension 08/19/2015  . Anxiety state 08/20/2015    Past Surgical History  Procedure Laterality Date  . Cardiac catheterization    . Cardiac catheterization N/A 08/26/2015    Procedure: Left Heart Cath and Coronary Angiography;  Surgeon: Jettie Booze, MD;  Location: [place] [facility];  Service: Cardiovascular;  Laterality: N/A;    Family history: family history  includes Cirrhosis in her mother; Depression in her mother; Heart disease in her father.  Social History: Patient lives with her daughter currently.  She does not use a cane or walker.  She is a former smoker.  She is independent with all ADLs.  She has PTSD since witnessing her 42 year old son commit suicide 3 years ago.       Physical Exam: BP 121/58 mmHg  Pulse 143  Temp(Src) 102.8 F (39.3 C) (Rectal)  Resp 27  SpO2 100% General appearance: Frail elderly female, alert and in mild distress from dyspnea.  Off BiPAP currently, speaking in short sentences. Eyes: Anicteric, conjunctiva pink, lids and lashes normal.     ENT: No nasal deformity, discharge, or epistaxis.  OP moist without lesions.  Drainage in posterior throat, no erythema.   Lymph: No cervical or supraclavicular lymphadenopathy. Skin: Warm and dry.  No jaundice.  No suspicious rashes or lesions. Cardiac: Tachycardic regular, nl S1-S2,  no murmurs appreciated.  Capillary refill is brisk.  JVP normal.  No LE edema.  Radial and DP pulses 2+ and symmetric. Respiratory: Accessory muscle use.  Short sentences.  Poor air movement, so no rales or wheezes appreciated. Abdomen: Abdomen soft without rigidity.  No TTP. No ascites, distension.   MSK: No deformities or effusions. Neuro: Sensorium intact and responding to questions, attention normal.  Speech is fluent.  Moves all extremities equally and with normal coordination.   Mild tremor. Psych: Behavior appropriate.  Affect anxious.  No evidence of aural or visual hallucinations or delusions.       Labs on Admission:  The metabolic panel shows normal electro lites and renal function. Transaminases and bilirubin are normal. Lactate is 2.8 . The BNP is 132 pg per mL. Blood cultures are pending. Flu test is pending. Troponin is negative The complete blood count shows leukocytosis, no anemia, platelet clumping, previous thrombocytosis.   Radiological Exams on  Admission: Personally reviewed: Dg Chest Portable 1 View  02/12/2016  CLINICAL DATA:  Shortness of breath and cough EXAM: PORTABLE CHEST 1 VIEW COMPARISON:  08/27/2015 FINDINGS: Cardiac shadow is within normal limits. The lungs are hyperaerated bilaterally. Bilateral nipple shadows are seen. No bony abnormality is seen. IMPRESSION: COPD without acute abnormality. Electronically Signed   By: Inez Catalina M.D.   On: 02/12/2016 16:32    EKG: Independently reviewed. Rate 155, QTc 409, no ST changes.  P waves are present, although poor baseline.    Assessment/Plan 1. Acute on chronic hypoxic and hypercapnic respiratory failure:  This is new.  Some component of this seems to be acute given acidosis. Sepsis is doubted, although she meets SIRS criteria, I think bacterial infection is equivocal and that her hypoxia and lactate do not represent sepsis-induced organ dysfunction. However, we are treating per sepsis protocol, and will rule out infection first.  Rather, res failure (evidenced by resp acidosis, hypoxia requiring BiPAP and respiratory distress on arrival) is likely from COPD flare, suspect viral trigger as well as anxiety.   -Check procalcitonin -Follow flu panel and respiratory virus panel -Repeat CXR tomorrow after fluids -Trend lactic acid -Follow blood and sputum cultures -Vancomycin given MRSA hx and severe underlying lung disease -Repeat MRSA swab now and de-escalate if possible - Ceftriaxone (patient had only hives/itching, "many years ago") and azithromycin for now, pending above studies, de-escalate to azithromycin if able -Prednisone 40 mg daily -Empiric oseltamivir while awaiting flu panel   2.  Acidosis:  This is new.  Either from lactate or respiratory acidosis (Bicarb at admission suggests that she doesn't have a chronic resp acidosis). -Continue BiPAP for now and repeat ABG in 2 hours, consider discontinuing when able  3. Severe PCM:  -Ensure between meals  4. COPD  Gold 4, high risk, FEV 21% at [facility] in 2015:  -Continue Advair, Incruse -Continue PPI -Short-acting bronchodilator as above  5. Tachycardia:  This appears to be sinus.  Was previously on diltiazem for reported SVT at [facility] in 2014 or 2015, not currently. -Telemetry -Fluids and treat underlying illness  6. PTSD and anxiety:  -Continue alprazolam PRN for anxiety -Continue buspirone, escitalopram  7. HTN: Normotensive now. -Hold lisinopril given concern for sepsis, restart if needed  8. Migraines: -Continue Fiorcet PRN     DVT PPx: Lovenox Diet: Regular Consultants: None Code Status: FULL Family Communication: None present  Medical decision making: What exists of the patient's previous chart and outside records from [facility] was reviewed in depth and the  case was discussed with Dr. Ronnald Ramp. Patient seen 7:40 PM on 02/12/2016.  Disposition Plan:  I recommend admission to stepdown, inpatient status.  Clinical condition: requiring BiPAP at present for acute respiratory failure, but appears to be improving respiratory status.  Anticipate prednisone, bronchodilators.  Ancillary studies to aid de-escalating Abx, given normal CXR.  Anticipate >2 nights hospitalization.      Edwin Dada [facility] Pager [phone]

## 2016-02-12 NOTE — ED Notes (Signed)
BiPAP applied patient tolerating it well. HR remains in the 150s-160. 3 RNs have attempted to gain further IV access without success. EDP made aware and IV team consulted.

## 2016-02-12 NOTE — ED Notes (Signed)
Pt becoming uncooperative and having increased SOB. Resp Tech at bedside to place BiPAP.

## 2016-02-12 NOTE — Progress Notes (Signed)
Pharmacy Code Sepsis Protocol  Time of code sepsis page: 1615 '[x]'$  Antibiotics delivered at 1622  '[]'$  Antibiotics administered prior to code at  (if checked, omit next 2 questions)  Were antibiotics ordered at the time of the code sepsis page? Yes Was it required to contact the physician? '[x]'$  Physician not contacted '[]'$  Physician contacted to order antibiotics for code sepsis '[]'$  Physician contacted to recommend changing antibiotics  Pharmacy consulted for: vancomycin + levaquin  Anti-infectives    Start     Dose/Rate Route Frequency Ordered Stop   02/12/16 1630  levofloxacin (LEVAQUIN) IVPB 750 mg     750 mg 100 mL/hr over 90 Minutes Intravenous  Once 02/12/16 1616     02/12/16 1630  vancomycin (VANCOCIN) IVPB 1000 mg/200 mL premix  Status:  Discontinued     1,000 mg 200 mL/hr over 60 Minutes Intravenous  Once 02/12/16 1616 02/12/16 1620   02/12/16 1630  vancomycin (VANCOCIN) IVPB 750 mg/150 ml premix     750 mg 150 mL/hr over 60 Minutes Intravenous STAT 02/12/16 1620 02/13/16 1630        Nurse education provided: '[x]'$  Minutes left to administer antibiotics to achieve 1 hour goal '[x]'$  Correct order of antibiotic administration '[x]'$  Antibiotic Y-site compatibilities     Griffey Nicasio, Rande Lawman, PharmD 02/12/2016, 4:24 PM

## 2016-02-12 NOTE — Progress Notes (Signed)
Pharmacy Antibiotic Note  Megan [name] is a 65 y.o. female admitted on 02/12/2016 with sepsis.  Presents with severe SOB, subjective fevers and cough.  Given Albuterol, Atrovent, and solumedrol by EMS to no effect.  Patient very small with limited muscle mass.  History of Penicillin Allergy (rash) but not history of Cephalosporin use.  Code sepsis called with likely respiratory source.  Pharmacy has been consulted for Vancomycin and Levofloxacin dosing.  On Admit: Febrile (102.8), HR 144, RR 29, WBC pending LA 2.8, SCr 0.69 (CrCl 45)  Plan: --Vancomycin 750 mg IV q24h (rounded down due to frailty) --Levofloxacin 750 mg IV q48h --Obtain vanc trough at steady state --Follow renal function, clinical course and cultures     Temp (24hrs), Avg:102.8 F (39.3 C), Min:102.8 F (39.3 C), Max:102.8 F (39.3 C)  No results for input(s): WBC, CREATININE, LATICACIDVEN, VANCOTROUGH, VANCOPEAK, VANCORANDOM, GENTTROUGH, GENTPEAK, GENTRANDOM, TOBRATROUGH, TOBRAPEAK, TOBRARND, AMIKACINPEAK, AMIKACINTROU, AMIKACIN in the last 168 hours.  CrCl cannot be calculated (Unknown ideal weight.).    Allergies  Allergen Reactions  . Penicillins Rash    Has patient had a PCN reaction causing immediate rash, facial/tongue/throat swelling, SOB or lightheadedness with hypotension: {Yes Has patient had a PCN reaction causing severe rash involving mucus membranes or skin necrosis:NO Has patient had a PCN reaction that required hospitalization {Yes Has patient had a PCN reaction occurring within the last 10 years:NO If all of the above answers are "NO", then may proceed with Cephalosporin use.    Antimicrobials this admission: 4/13 Levaquin >>  4/13 Vanc >>   Dose adjustments this admission:   Microbiology results: 4/13 BCx:  4/13 UCx:   4/13 Sputum:     Thank you for allowing pharmacy to be a part of this patient's care.  Viann Fish 02/12/2016 4:21 PM

## 2016-02-12 NOTE — ED Notes (Signed)
Pt states she feels better and would like to try with nasal cannula only. ED resident made aware and states this is ok. Transitioned patient to a nasal cannula, 4 L, she is tolerating it well and O2 remains 100%.

## 2016-02-12 NOTE — Progress Notes (Signed)
Found off Bipap on 4L [place].  Patient exhibits mild accessory muscle use with 3-4 word sentences, however, admits to improvement in work of breathing.  Patient feels comfortable off of Bipap at this time with stable SpO2.  RT will continue to monitor.

## 2016-02-12 NOTE — ED Provider Notes (Signed)
L level 5 caveat patient extremely dyspneic. Patient seen on arrival complains of shortness of breath and chest tightness onset this morning. Also admits to mild dry cough Other associated symptoms include subjective fever. She was treated by EMS with Solu-Medrol, and DuoNeb. On exam patient is anxious appearing, chronically and acutely ill-appearing speaks in short sentences. Moderate respiratory distress heart regular rate and rhythm lungs diminished breath sounds diffusely abdomen nondistended nontender extremities without edema 4:03 PM patient reports her breathing is improved while on nebulized treatment. She now speaks in full sentences. Appears in less rest for distress and appears more comfortable  4:10 PM patient is more dyspneic states she's getting tired to breathe. She is mildly combative,, confused pushing the mask from nebulizer away. BiPAP ordered. She's noted be febrile. Code sepsis called. Source of infection likely respiratory  4:40 PM patient states she's breathing more comfortably while on BiPAP. She remains tachycardic and tachypnea 7:10 PM continues to rest comfortably on BiPAP. I don't feel the patient needs intubation. She states her breathing is much improved . Arterial blood gases consistent with rest for failure and respiratory acidosis however will allow for permissive hypercapnia Chest x-ray viewed by me Results for orders placed or performed during the hospital encounter of 02/12/16  Blood Culture (routine x 2)  Result Value Ref Range   Specimen Description BLOOD RIGHT ANTECUBITAL    Special Requests IN PEDIATRIC BOTTLE 1CC    Culture PENDING    Report Status PENDING   Blood Culture (routine x 2)  Result Value Ref Range   Specimen Description BLOOD RIGHT WRIST    Special Requests IN PEDIATRIC BOTTLE 2CC    Culture PENDING    Report Status PENDING   Brain natriuretic peptide  Result Value Ref Range   B Natriuretic Peptide 132.4 (H) 0.0 - 100.0 pg/mL   Comprehensive metabolic panel  Result Value Ref Range   Sodium 139 135 - 145 mmol/L   Potassium 4.4 3.5 - 5.1 mmol/L   Chloride 99 (L) 101 - 111 mmol/L   CO2 25 22 - 32 mmol/L   Glucose, Bld 174 (H) 65 - 99 mg/dL   BUN 7 6 - 20 mg/dL   Creatinine, Ser 0.69 0.44 - 1.00 mg/dL   Calcium 9.1 8.9 - 10.3 mg/dL   Total Protein 6.7 6.5 - 8.1 g/dL   Albumin 4.0 3.5 - 5.0 g/dL   AST 28 15 - 41 U/L   ALT 16 14 - 54 U/L   Alkaline Phosphatase 69 38 - 126 U/L   Total Bilirubin 0.8 0.3 - 1.2 mg/dL   GFR calc non Af Amer >60 >60 mL/min   GFR calc Af Amer >60 >60 mL/min   Anion gap 15 5 - 15  CBC with Differential  Result Value Ref Range   WBC 31.0 (H) 4.0 - 10.5 K/uL   RBC 4.54 3.87 - 5.11 MIL/uL   Hemoglobin 14.8 12.0 - 15.0 g/dL   HCT 43.9 36.0 - 46.0 %   MCV 96.7 78.0 - 100.0 fL   MCH 32.6 26.0 - 34.0 pg   MCHC 33.7 30.0 - 36.0 g/dL   RDW 12.2 11.5 - 15.5 %   Platelets PLATELET CLUMPS NOTED ON SMEAR, UNABLE TO ESTIMATE 150 - 400 K/uL   Neutrophils Relative % 83 %   Lymphocytes Relative 9 %   Monocytes Relative 8 %   Eosinophils Relative 0 %   Basophils Relative 0 %   Neutro Abs 25.7 (H) 1.7 - 7.7 K/uL  Lymphs Abs 2.8 0.7 - 4.0 K/uL   Monocytes Absolute 2.5 (H) 0.1 - 1.0 K/uL   Eosinophils Absolute 0.0 0.0 - 0.7 K/uL   Basophils Absolute 0.0 0.0 - 0.1 K/uL   Smear Review MORPHOLOGY UNREMARKABLE   I-Stat Troponin, ED (not at [facility])  Result Value Ref Range   Troponin i, poc 0.00 0.00 - 0.08 ng/mL   Comment 3          I-Stat CG4 Lactic Acid, ED  (not at  [facility])  Result Value Ref Range   Lactic Acid, Venous 2.80 (HH) 0.5 - 2.0 mmol/L   Comment NOTIFIED PHYSICIAN   I-Stat arterial blood gas, ED ([facility], [facility])  Result Value Ref Range   pH, Arterial 7.292 (L) 7.350 - 7.450   pCO2 arterial 67.1 (HH) 35.0 - 45.0 mmHg   pO2, Arterial 86.0 80.0 - 100.0 mmHg   Bicarbonate 32.4 (H) 20.0 - 24.0 mEq/L   TCO2 34 0 - 100 mmol/L   O2 Saturation 95.0 %   Acid-Base Excess 4.0 (H) 0.0 - 2.0 mmol/L    Patient temperature 98.6 F    Collection site RADIAL, ALLEN'S TEST ACCEPTABLE    Drawn by RT    Sample type ARTERIAL    Comment NOTIFIED PHYSICIAN    Dg Chest Portable 1 View  02/12/2016  CLINICAL DATA:  Shortness of breath and cough EXAM: PORTABLE CHEST 1 VIEW COMPARISON:  08/27/2015 FINDINGS: Cardiac shadow is within normal limits. The lungs are hyperaerated bilaterally. Bilateral nipple shadows are seen. No bony abnormality is seen. IMPRESSION: COPD without acute abnormality. Electronically Signed   By: Inez Catalina M.D.   On: 02/12/2016 16:32  will plan on admission to step down unit  Patient treated with broad-spectrum antibiotics Diagnosis #1 severe sepsis #2 acute respiratory failure #3hyperglycemia CRITICAL CARE Performed by: Orlie Dakin Total critical care time: 40 minutes Critical care time was exclusive of separately billable procedures and treating other patients. Critical care was necessary to treat or prevent imminent or life-threatening deterioration. Critical care was time spent personally by me on the following activities: development of treatment plan with patient and/or surrogate as well as nursing, discussions with consultants, evaluation of patient's response to treatment, examination of patient, obtaining history from patient or surrogate, ordering and performing treatments and interventions, ordering and review of laboratory studies, ordering and review of radiographic studies, pulse oximetry and re-evaluation of patient's condition.  Orlie Dakin, MD 02/13/16 0001

## 2016-02-12 NOTE — ED Provider Notes (Signed)
CSN: [id number]     Arrival date & time 02/12/16  1548 History   First MD Initiated Contact with Patient 02/12/16 1551     Chief Complaint  Patient presents with  . Shortness of Breath     (Consider location/radiation/quality/duration/timing/severity/associated sxs/prior Treatment) Patient is a 65 y.o. female presenting with shortness of breath. The history is provided by the patient.  Shortness of Breath Severity:  Severe Onset quality:  Gradual Duration:  2 days Timing:  Constant Progression:  Worsening Chronicity:  Recurrent Context: activity and URI (cough)   Relieved by:  Nothing Worsened by:  Activity, deep breathing, exertion, movement and stress Ineffective treatments:  Inhaler and oxygen Associated symptoms: cough, fever and wheezing   Associated symptoms: no abdominal pain, no chest pain, no hemoptysis, no rash, no sore throat, no syncope and no vomiting   Fever:    Duration:  1 day   Timing:  Constant   Temp source:  Subjective   Progression:  Waxing and waning Risk factors: tobacco use   Risk factors: no hx of cancer, no hx of PE/DVT, no prolonged immobilization and no recent surgery      Past Medical History  Diagnosis Date  . Myocardial infarction ([place])   . CHF (congestive heart failure) ([place])   . COPD (chronic obstructive pulmonary disease) ([place])   . Shortness of breath dyspnea   . Depression   . GERD (gastroesophageal reflux disease)   . History of hiatal hernia   . Headache   . Anemia   . Essential hypertension 08/19/2015  . Anxiety state 08/20/2015   Past Surgical History  Procedure Laterality Date  . Cardiac catheterization    . Cardiac catheterization N/A 08/26/2015    Procedure: Left Heart Cath and Coronary Angiography;  Surgeon: Jettie Booze, MD;  Location: [facility];  Service: Cardiovascular;  Laterality: N/A;   Family History  Problem Relation Age of Onset  . Cirrhosis Mother   . Depression Mother   . Heart disease  Father    Social History  Substance Use Topics  . Smoking status: Former Smoker    Quit date: 08/18/2000  . Smokeless tobacco: Never Used  . Alcohol Use: No   OB History    No data available     Review of Systems  Constitutional: Positive for fever, chills, activity change and fatigue.  HENT: Negative for congestion and sore throat.   Respiratory: Positive for cough, chest tightness, shortness of breath and wheezing. Negative for hemoptysis.   Cardiovascular: Negative for chest pain and syncope.  Gastrointestinal: Negative for nausea, vomiting, abdominal pain and diarrhea.  Musculoskeletal: Negative for myalgias and back pain.  Skin: Negative for rash.  Psychiatric/Behavioral: The patient is nervous/anxious.   All other systems reviewed and are negative.     Allergies  Penicillins  Home Medications   Prior to Admission medications   Medication Sig Start Date End Date Taking? Authorizing Provider  acetaminophen (TYLENOL) 500 MG tablet Take 500 mg by mouth every 6 (six) hours as needed for mild pain.    Historical Provider, MD  albuterol (PROVENTIL HFA;VENTOLIN HFA) 108 (90 BASE) MCG/ACT inhaler Inhale 1 puff into the lungs every 4 (four) hours as needed for wheezing or shortness of breath. 08/22/15   Iline Oven, MD  ALPRAZolam Duanne Moron) 0.25 MG tablet Take 1 tablet (0.25 mg total) by mouth 2 (two) times daily as needed for anxiety. 08/29/15   Juluis Mire, MD  aspirin 81 MG chewable tablet Chew  81 mg by mouth daily.    Historical Provider, MD  azithromycin (ZITHROMAX) 250 MG tablet Take 1 tablet (250 mg total) by mouth daily. 08/29/15   Juluis Mire, MD  busPIRone (BUSPAR) 7.5 MG tablet Take 2 tablets (15 mg total) by mouth 2 (two) times daily. 08/29/15   Juluis Mire, MD  butalbital-acetaminophen-caffeine (FIORICET) 50-325-40 MG tablet Take 1-2 tablets by mouth every 6 (six) hours as needed for headache. 08/22/15 08/21/16  Iline Oven, MD   butalbital-acetaminophen-caffeine (FIORICET, ESGIC) [phone] MG tablet Take 1 tablet by mouth 2 (two) times daily as needed for headache. 08/29/15   Juluis Mire, MD  cyproheptadine (PERIACTIN) 4 MG tablet Take 4 mg by mouth daily as needed (appetitie stimulant).    Historical Provider, MD  escitalopram (LEXAPRO) 20 MG tablet Take 2 tablets (40 mg total) by mouth daily. 08/29/15   Juluis Mire, MD  Fluticasone-Salmeterol (ADVAIR) 500-50 MCG/DOSE AEPB Inhale 1 puff into the lungs 2 (two) times daily. 08/22/15   Iline Oven, MD  ipratropium-albuterol (DUONEB) 0.5-2.5 (3) MG/3ML SOLN Take 3 mLs by nebulization every 6 (six) hours as needed (shortness of breath).    Historical Provider, MD  lidocaine (LIDODERM) 5 % Place 1 patch onto the skin daily as needed (pain). Remove & Discard patch within 12 hours or as directed by MD    Historical Provider, MD  lisinopril (PRINIVIL,ZESTRIL) 2.5 MG tablet Take 2.5 mg by mouth daily.    Historical Provider, MD  Melatonin 3 MG TABS Take 1 tablet by mouth at bedtime as needed (sleep).    Historical Provider, MD  Multiple Vitamin (MULTIVITAMIN WITH MINERALS) TABS tablet Take 1 tablet by mouth daily.    Historical Provider, MD  omeprazole (PRILOSEC) 40 MG capsule Take 40 mg by mouth daily.    Historical Provider, MD  predniSONE (DELTASONE) 20 MG tablet Take 1 tablet (20 mg total) by mouth daily with breakfast. 08/29/15   Juluis Mire, MD  promethazine (PHENERGAN) 12.5 MG tablet Take 12.5 mg by mouth every 6 (six) hours as needed for nausea or vomiting.    Historical Provider, MD  Umeclidinium Bromide (INCRUSE ELLIPTA) 62.5 MCG/INH AEPB Inhale 1 puff into the lungs daily. 08/22/15   Iline Oven, MD   BP 86/53 mmHg  Pulse 92  Temp(Src) 98.5 F (36.9 C) (Axillary)  Resp 20  Ht '5\' 3"'$  (1.6 m)  Wt 57.9 kg  BMI 22.62 kg/m2  SpO2 99% Physical Exam  Constitutional: She is oriented to person, place, and time. She appears cachectic. She is  cooperative. She appears distressed.  HENT:  Head: Normocephalic and atraumatic.  Nose: Nose normal.  Mouth/Throat: Oropharynx is clear and moist.  Eyes: Conjunctivae and EOM are normal. Pupils are equal, round, and reactive to light.  Neck: Normal range of motion. Neck supple.  Cardiovascular: Normal rate, regular rhythm, normal heart sounds and intact distal pulses.   Pulmonary/Chest: Tachypnea noted. She is in respiratory distress. She has decreased breath sounds. She has no wheezes. She has no rales. She exhibits tenderness.  Abdominal: Soft. Bowel sounds are normal. There is no tenderness.  Musculoskeletal: She exhibits no edema or tenderness.  Neurological: She is alert and oriented to person, place, and time. No cranial nerve deficit. Coordination normal.  Skin: Skin is warm and dry. No rash noted. She is not diaphoretic.  Nursing note and vitals reviewed.   ED Course  Procedures (including critical care time) Labs Review Labs Reviewed  MRSA PCR SCREENING - Abnormal; Notable for  the following:    MRSA by PCR POSITIVE (*)    All other components within normal limits  BRAIN NATRIURETIC PEPTIDE - Abnormal; Notable for the following:    B Natriuretic Peptide 132.4 (*)    All other components within normal limits  COMPREHENSIVE METABOLIC PANEL - Abnormal; Notable for the following:    Chloride 99 (*)    Glucose, Bld 174 (*)    All other components within normal limits  URINALYSIS, ROUTINE W REFLEX MICROSCOPIC (NOT AT [facility]) - Abnormal; Notable for the following:    Glucose, UA 500 (*)    Hgb urine dipstick MODERATE (*)    Protein, ur 100 (*)    All other components within normal limits  CBC WITH DIFFERENTIAL/PLATELET - Abnormal; Notable for the following:    WBC 31.0 (*)    Neutro Abs 25.7 (*)    Monocytes Absolute 2.5 (*)    All other components within normal limits  URINE MICROSCOPIC-ADD ON - Abnormal; Notable for the following:    Squamous Epithelial / LPF 0-5 (*)     Bacteria, UA RARE (*)    All other components within normal limits  CBC - Abnormal; Notable for the following:    WBC 20.6 (*)    All other components within normal limits  BASIC METABOLIC PANEL - Abnormal; Notable for the following:    Chloride 100 (*)    Glucose, Bld 186 (*)    Calcium 8.5 (*)    All other components within normal limits  CBC - Abnormal; Notable for the following:    WBC 20.6 (*)    RBC 3.77 (*)    All other components within normal limits  I-STAT CG4 LACTIC ACID, ED - Abnormal; Notable for the following:    Lactic Acid, Venous 2.80 (*)    All other components within normal limits  I-STAT ARTERIAL BLOOD GAS, ED - Abnormal; Notable for the following:    pH, Arterial 7.292 (*)    pCO2 arterial 67.1 (*)    Bicarbonate 32.4 (*)    Acid-Base Excess 4.0 (*)    All other components within normal limits  I-STAT CG4 LACTIC ACID, ED - Abnormal; Notable for the following:    Lactic Acid, Venous 3.45 (*)    All other components within normal limits  CULTURE, BLOOD (ROUTINE X 2)  CULTURE, BLOOD (ROUTINE X 2)  URINE CULTURE  CULTURE, EXPECTORATED SPUTUM-ASSESSMENT  RESPIRATORY VIRUS PANEL  INFLUENZA PANEL BY PCR (TYPE A & B, H1N1)  STREP PNEUMONIAE URINARY ANTIGEN  PROCALCITONIN  LACTIC ACID, PLASMA  LACTIC ACID, PLASMA  CREATININE, SERUM  LEGIONELLA PNEUMOPHILA SEROGP 1 UR AG  BLOOD GAS, ARTERIAL  I-STAT TROPOININ, ED    Imaging Review Dg Chest Portable 1 View  02/12/2016  CLINICAL DATA:  Shortness of breath and cough EXAM: PORTABLE CHEST 1 VIEW COMPARISON:  08/27/2015 FINDINGS: Cardiac shadow is within normal limits. The lungs are hyperaerated bilaterally. Bilateral nipple shadows are seen. No bony abnormality is seen. IMPRESSION: COPD without acute abnormality. Electronically Signed   By: Inez Catalina M.D.   On: 02/12/2016 16:32   I have personally reviewed and evaluated these images and lab results as part of my medical decision-making.   EKG  Interpretation   Date/Time:  Thursday February 12 2016 15:57:07 EDT Ventricular Rate:  156 PR Interval:  106 QRS Duration: 87 QT Interval:  254 QTC Calculation: 409 R Axis:   74 Text Interpretation:  Supraventricular tachycardia RSR' in V1 or V2,  probably  normal variant Artifact in lead(s) I II III aVR aVL aVF V1 V2 V3  V4 V5 V6 SINCE LAST TRACING HEART RATE HAS INCREASED Confirmed by  Winfred Leeds  MD, SAM [phone]) on 02/12/2016 4:00:24 PM      MDM  65 y.o. with history of COPD, anxiety, CAD, CHF who presents to the emergency department by EMS for respiratory distress and shortness of breath. She said she had progressively worsening shortness of breath primarily with worsening cough over the last 2-3 days.. She notes a one-day history of subjective fevers, myalgias. She had a history of similar symptoms last year when she was found have pneumonia. She reportedly had to be intubated at that time. On arrival the patient was noted to be in significant respiratory distress, initially with albuterol nebulizer in place. Physical exam significant for cachectic appearing female with prominent tachypnea with retractions with significantly decreased breath sounds bilaterally but no significant wheezing rales or rhonchi. She became mildly confused shortly after arrival and examined was raised for worsening respiratory failure. She was placed on BiPAP and improved. Her vitals were signifiacant for prominent tachycardia and fever of 102.8. Stable blood pressure, initially significantly hypertensive but then improving with symptomatic relief. Code sepsis labs and fluids were ordered. Chest x-ray was done and showed evidence of COPD without acute abnormality. Her labs returned showing very significant leukocytosis of 31, ABG showed acidosis of 7.292 with CO2 of 67.1 and O2 86 bicarbonate of 32.4. Initial lactate was 2.8. BNP 132.4. Given her reassuring chest xray, feel that this is likely a viral precipitated COPD  exacerbation with acute respiratory distress and failure.  The patient stabilized on the bipap and was then admitted to hospitalist step down unit for further care and assessment.   Final diagnoses:  Cough        Zenovia Jarred, DO 02/13/16 0105  Orlie Dakin, MD 02/14/16 0375

## 2016-02-12 NOTE — Progress Notes (Signed)
ABG results given to Dr. Winfred Leeds at 1710.

## 2016-02-12 NOTE — ED Notes (Signed)
Attempted report 

## 2016-02-12 NOTE — Progress Notes (Signed)
Placed patient on BIPAP. Patient is tolerating well at this time.

## 2016-02-13 ENCOUNTER — Inpatient Hospital Stay (HOSPITAL_COMMUNITY): Payer: Medicare Other

## 2016-02-13 LAB — BLOOD GAS, ARTERIAL
ACID-BASE EXCESS: 3.3 mmol/L — AB (ref 0.0–2.0)
Acid-base deficit: 2.1 mmol/L — ABNORMAL HIGH (ref 0.0–2.0)
BICARBONATE: 29.8 meq/L — AB (ref 20.0–24.0)
BICARBONATE: 22 meq/L (ref 20.0–24.0)
DELIVERY SYSTEMS: POSITIVE
Delivery systems: POSITIVE
Drawn by: 405301
Drawn by: 405301
EXPIRATORY PAP: 6
Expiratory PAP: 6
FIO2: 0.4
FIO2: 0.3
Inspiratory PAP: 12
Inspiratory PAP: 14
LHR: 12 {breaths}/min
LHR: 12 {breaths}/min
MODE: POSITIVE
MODE: POSITIVE
O2 SAT: 97.7 %
O2 Saturation: 99.4 %
PEEP: 6 cmH2O
PEEP: 6 cmH2O
PO2 ART: 108 mmHg — AB (ref 80.0–100.0)
PO2 ART: 138 mmHg — AB (ref 80.0–100.0)
Patient temperature: 98.6
Patient temperature: 97.8
Pressure control: 6 cmH2O
Pressure control: 8 cmH2O
TCO2: 31.9 mmol/L (ref 0–100)
TCO2: 23.2 mmol/L (ref 0–100)
pCO2 arterial: 67.8 mmHg (ref 35.0–45.0)
pCO2 arterial: 36.1 mmHg (ref 35.0–45.0)
pH, Arterial: 7.265 — ABNORMAL LOW (ref 7.350–7.450)
pH, Arterial: 7.401 (ref 7.350–7.450)

## 2016-02-13 LAB — BASIC METABOLIC PANEL
Anion gap: 9 (ref 5–15)
BUN: 10 mg/dL (ref 6–20)
CHLORIDE: 100 mmol/L — AB (ref 101–111)
CO2: 27 mmol/L (ref 22–32)
CREATININE: 0.71 mg/dL (ref 0.44–1.00)
Calcium: 8.5 mg/dL — ABNORMAL LOW (ref 8.9–10.3)
GFR calc non Af Amer: >60 mL/min (ref >60)
Glucose, Bld: 186 mg/dL — ABNORMAL HIGH (ref 65–99)
POTASSIUM: 4.1 mmol/L (ref 3.5–5.1)
Sodium: 136 mmol/L (ref 135–145)

## 2016-02-13 LAB — CBC
HEMATOCRIT: 36.5 % (ref 36.0–46.0)
Hemoglobin: 12.2 g/dL (ref 12.0–15.0)
MCH: 32.4 pg (ref 26.0–34.0)
MCHC: 33.4 g/dL (ref 30.0–36.0)
MCV: 96.8 fL (ref 78.0–100.0)
PLATELETS: 281 10*3/uL (ref 150–400)
RBC: 3.77 MIL/uL — AB (ref 3.87–5.11)
RDW: 12.3 % (ref 11.5–15.5)
WBC: 20.6 10*3/uL — ABNORMAL HIGH (ref 4.0–10.5)

## 2016-02-13 LAB — LACTIC ACID, PLASMA: LACTIC ACID, VENOUS: 1.3 mmol/L (ref 0.5–2.0)

## 2016-02-13 LAB — STREP PNEUMONIAE URINARY ANTIGEN: Strep Pneumo Urinary Antigen: POSITIVE — AB

## 2016-02-13 MED ORDER — SODIUM CHLORIDE 0.9 % IV BOLUS (SEPSIS)
1000.0000 mL | Freq: Once | INTRAVENOUS | Status: AC
Start: 2016-02-13 — End: 2016-02-13
  Administered 2016-02-13: 1000 mL via INTRAVENOUS

## 2016-02-13 MED ORDER — SODIUM CHLORIDE 0.9 % IV BOLUS (SEPSIS)
500.0000 mL | Freq: Once | INTRAVENOUS | Status: AC
  Administered 2016-02-13: 500 mL via INTRAVENOUS

## 2016-02-13 MED ORDER — ENSURE ENLIVE PO LIQD
237.0000 mL | Freq: Three times a day (TID) | ORAL | Status: DC
  Administered 2016-02-13 – 2016-02-19 (×7): 237 mL via ORAL

## 2016-02-13 MED ORDER — SODIUM CHLORIDE 0.9 % IV BOLUS (SEPSIS)
1000.0000 mL | Freq: Once | INTRAVENOUS | Status: AC
  Administered 2016-02-13: 1000 mL via INTRAVENOUS

## 2016-02-13 MED ORDER — SODIUM CHLORIDE 0.9 % IV BOLUS (SEPSIS)
250.0000 mL | Freq: Once | INTRAVENOUS | Status: AC
  Administered 2016-02-13: 250 mL via INTRAVENOUS

## 2016-02-13 MED ORDER — ALBUTEROL SULFATE (2.5 MG/3ML) 0.083% IN NEBU
2.5000 mg | INHALATION_SOLUTION | Freq: Three times a day (TID) | RESPIRATORY_TRACT | Status: DC
  Administered 2016-02-13 – 2016-02-15 (×9): 2.5 mg via RESPIRATORY_TRACT
  Filled 2016-02-13 (×8): qty 3

## 2016-02-13 NOTE — Progress Notes (Signed)
CRITICAL VALUE ALERT  Critical value received:  ABG pH 7.265, pCO2 67.8, pO2 108, Bicarb 29.8  Date of notification:  02/13/2016  Time of notification:  0138  Critical value read back:Yes.    Nurse who received alert:  Dorene Grebe, RN  MD notified (1st page):  L. Harduk, PA  Time of first page:  0140  MD notified (2nd page):  Time of second page:  Responding MD:  Roger Shelter, PA  Time MD responded:  0142, orders received for 250 cc bolus. Will administer and continue to monitor.  Sherlie Ban, RN

## 2016-02-13 NOTE — Progress Notes (Signed)
Initial Nutrition Assessment  DOCUMENTATION CODES:   Severe malnutrition in context of chronic illness  INTERVENTION:  -Ensure Enlive TID. Each supplement provides 350 kcals and 20 grams of protein. -Continue to monitor nutritional needs.   NUTRITION DIAGNOSIS:   Malnutrition related to chronic illness as evidenced by severe depletion of body fat, severe depletion of muscle mass.  GOAL:   Patient will meet greater than or equal to 90% of their needs  MONITOR:   PO intake, Supplement acceptance, Labs, Weight trends, Skin, I & O's  REASON FOR ASSESSMENT:   Malnutrition Screening Tool    ASSESSMENT:   Megan [name] is a 65 y.o. female with a past medical history significant for COPD stage 4, FEV1 21% on home O2, HTN, and PTSD who presents with shortness of breath and fever.   Pt seen for MST. Per chart, pt is currently 127 lbs. Pt reports weighing around 95 lbs. Suspect weight of 127 lbs is inaccurate. Pt reports gaining about 10 lbs in the past few months. Pt weighed 87 lbs 08/28/2015. Pt reports her clothes still fitting the same as they did in October. Pt reports eating TID. Pt denies N/V and abdominal pain associated with eating. Pt reports drinking Ensure at home on occasion but does not drink them consistently d/t cost. Pt would like to continue receiving Ensure. Will change order from BID to TID. Per chart, pt ate 75% of lunch. Pt reports eating well and having a good appetite while in hospital. Pt reports wanting to gain weight. Intern encouraged high calorie food intake and adequate protein intake.    NFPE: severe muscle depletion, severe fat depletion, no edema.   Labs reviewed; Cl 100 mmol/L, Ca 8.5 mg/dl, glucose 186 mg/dl.  Meds reviewed.  Diet Order:  Diet regular Room service appropriate?: Yes; Fluid consistency:: Thin  Skin:  Reviewed, no issues  Last BM:  unknown  Height:   Ht Readings from Last 1 Encounters:  02/12/16 '5\' 3"'$  (1.6 m)    Weight:   Wt  Readings from Last 1 Encounters:  02/12/16 127 lb 10.3 oz (57.9 kg)    Ideal Body Weight:  52.3 kg  BMI:  Body mass index is 22.62 kg/(m^2).  Estimated Nutritional Needs:   Kcal:  1550-1750 kcals (30-33 kcals/kg IBW)  Protein:  60-70 g (1.2-1.3 g/kg IBW)  Fluid:  1.5-1.7 L  EDUCATION NEEDS:   No education needs identified at this time  Geoffery Lyons, [place] Dietetic Intern Pager [phone]

## 2016-02-13 NOTE — Progress Notes (Signed)
PROGRESS NOTE    Megan [name]  KGY:[id number] DOB: 1951-06-02 DOA: 02/12/2016 PCP: Pcp Not In [place] Outpatient Specialists:     Brief Narrative:  65 y.o. 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 Acute on chronic hypoxic and hypercapnic respiratory failure:  Resp failure (evidenced by resp acidosis, hypoxia requiring BiPAP and respiratory distress on arrival) is likely from COPD flare, suspect viral trigger as well as anxiety.  -Follow flu panel and respiratory virus panel -Trend lactic acid - normalized -Follow blood and sputum cultures -Urine strep pneumo serology pos -Vancomycin given MRSA hx and severe underlying lung disease - Ceftriaxone (patient had only hives/itching, "many years ago") and azithromycin for now, pending above studies, de-escalate to azithromycin if able -Prednisone 40 mg daily -Empiric oseltamivir started. Flu neg, thus will d/c tamiflu  2. Acidosis:  This is new. Either from lactate or respiratory acidosis -Resolved  3. Severe PCM:  -Ensure between meals  4. COPD Gold 4, high risk, FEV 21% at [facility] in 2015:  -Continue Advair, Incruse -Continue PPI -Short-acting bronchodilator as above  5. Tachycardia:  This appears to be sinus. Was previously on diltiazem for reported SVT at [facility] in 2014 or 2015, not currently. -Fluids and treat underlying illness  6. PTSD and anxiety:  -Continue alprazolam PRN for anxiety -Continue buspirone, escitalopram  7. HTN: Normotensive now. -Hold lisinopril given concern for sepsis,  restart if needed  8. Migraines: -Continue Fiorcet PRN   DVT prophylaxis: Lovenox Code Status: Full Family Communication: Patient in room Disposition Plan: Uncertain at this point, possible home with home health  Consultants:     Procedures:    Antimicrobials:   Rocephin, azithromycin 4/13>>>   Subjective: Feels better today  Objective: Filed Vitals:   02/13/16 1156 02/13/16 1354 02/13/16 1500 02/13/16 1609  BP: 103/59  110/54 115/63  Pulse: 106  109 112  Temp: 97.9 F (36.6 C)   97.9 F (36.6 C)  TempSrc: Oral   Oral  Resp: '14  16 18  '$ Height:      Weight:      SpO2: 98% 99% 98% 95%    Intake/Output Summary (Last 24 hours) at 02/13/16 1657 Last data filed at 02/13/16 1602  Gross per 24 hour  Intake   5460 ml  Output   1000 ml  Net   4460 ml   Filed Weights   02/12/16 2108  Weight: 57.9 kg (127 lb 10.3 oz)    Examination:  General exam: Appears calm and comfortable, sitting in bed Respiratory system: Wheezing throughout, coarse breathsounds. Respiratory effort normal. Cardiovascular system: S1 & S2 heard, RRR. No JVD, murmurs, rubs, gallops or clicks. No pedal edema. Gastrointestinal system: Abdomen is nondistended, soft and nontender. No organomegaly or masses felt. Normal bowel sounds heard. Central nervous system: Alert and oriented. No focal neurological deficits. Extremities: Symmetric 5 x 5 power. Skin: No rashes, lesions or ulcers Psychiatry: Judgement and insight appear normal. Mood & affect appropriate.     Data Reviewed: I have personally reviewed following labs and imaging studies  CBC:  Recent Labs  Lab 02/12/16 1808 02/12/16 2212 02/13/16 0024  WBC 31.0* 20.6* 20.6*  NEUTROABS 25.7*  --   --   HGB 14.8 12.3 12.2  HCT 43.9 37.6 36.5  MCV 96.7 96.4 96.8  PLT PLATELET CLUMPS NOTED ON SMEAR, UNABLE TO ESTIMATE 264 710   Basic Metabolic Panel:  Recent Labs Lab 02/12/16 1645 02/12/16 2212 02/13/16 0024  NA 139  --  136  K  4.4  --  4.1  CL 99*  --  100*  CO2 25  --  27  GLUCOSE 174*  --  186*  BUN 7  --  10  CREATININE 0.69 0.70 0.71  CALCIUM 9.1  --  8.5*   GFR: Estimated Creatinine Clearance: 59.5 mL/min (by C-G formula based on Cr of 0.71). Liver Function Tests:  Recent Labs Lab 02/12/16 1645  AST 28  ALT 16  ALKPHOS 69  BILITOT 0.8  PROT 6.7  ALBUMIN 4.0   No results for input(s): LIPASE, AMYLASE in the last 168 hours. No results for input(s): AMMONIA in the last 168 hours. Coagulation Profile: No results for input(s): INR, PROTIME in the last 168 hours. Cardiac Enzymes: No results for input(s): CKTOTAL, CKMB, CKMBINDEX, TROPONINI in the last 168 hours. BNP (last 3 results) No results for input(s): PROBNP in the last 8760 hours. HbA1C: No results for input(s): HGBA1C in the last 72 hours. CBG: No results for input(s): GLUCAP in the last 168 hours. Lipid Profile: No results for input(s): CHOL, HDL, LDLCALC, TRIG, CHOLHDL, LDLDIRECT in the last 72 hours. Thyroid Function Tests: No results for input(s): TSH, T4TOTAL, FREET4, T3FREE, THYROIDAB in the last 72 hours. Anemia Panel: No results for input(s): VITAMINB12, FOLATE, FERRITIN, TIBC, IRON, RETICCTPCT in the last 72 hours. Urine analysis:    Component Value Date/Time   COLORURINE YELLOW 02/12/2016 2026   APPEARANCEUR CLEAR 02/12/2016 2026   LABSPEC 1.020 02/12/2016 2026   PHURINE 6.0 02/12/2016 2026   GLUCOSEU 500* 02/12/2016 2026   HGBUR MODERATE* 02/12/2016 2026   BILIRUBINUR NEGATIVE 02/12/2016 2026   KETONESUR NEGATIVE 02/12/2016 2026   PROTEINUR 100* 02/12/2016 2026   UROBILINOGEN 0.2 08/25/2015 2130   NITRITE NEGATIVE 02/12/2016 2026   LEUKOCYTESUR NEGATIVE 02/12/2016 2026   Sepsis Labs: '@LABRCNTIP'$ (procalcitonin:4,lacticidven:4)  ) Recent Results (from the past 240 hour(s))  Blood Culture (routine x 2)     Status: None (Preliminary result)   Collection Time: 02/12/16  4:32 PM  Result Value Ref Range Status    Specimen Description BLOOD RIGHT ANTECUBITAL  Final   Special Requests IN PEDIATRIC BOTTLE 1CC  Final   Culture NO GROWTH < 24 HOURS  Final   Report Status PENDING  Incomplete  Blood Culture (routine x 2)     Status: None (Preliminary result)   Collection Time: 02/12/16  4:44 PM  Result Value Ref Range Status   Specimen Description BLOOD RIGHT WRIST  Final   Special Requests IN PEDIATRIC BOTTLE 2CC  Final   Culture NO GROWTH < 24 HOURS  Final   Report Status PENDING  Incomplete  Urine culture     Status: None (Preliminary result)   Collection Time: 02/12/16  8:26 PM  Result Value Ref Range Status   Specimen Description URINE, RANDOM  Final   Special Requests NONE  Final   Culture NO GROWTH < 24 HOURS  Final   Report Status PENDING  Incomplete  MRSA PCR Screening     Status: Abnormal   Collection Time: 02/12/16  9:15 PM  Result Value  Ref Range Status   MRSA by PCR POSITIVE (A) NEGATIVE Final    Comment:        The GeneXpert MRSA Assay (FDA approved for NASAL specimens only), is one component of a comprehensive MRSA colonization surveillance program. It is not intended to diagnose MRSA infection nor to guide or monitor treatment for MRSA infections. RESULT CALLED TO, READ BACK BY AND VERIFIED WITH: C MOSELEY,RN '@2325'$  02/12/16 [facility]          Radiology Studies: X-ray Chest Pa And Lateral  02/13/2016  CLINICAL DATA:  Cough, follow-up.  Prior smoker. EXAM: CHEST  2 VIEW COMPARISON:  02/12/2016 FINDINGS: There is hyperinflation of the lungs compatible with COPD. Linear scarring or atelectasis in the lung bases. Small bilateral pleural effusions. Heart is normal size. No acute bony abnormality. IMPRESSION: COPD.  Bibasilar atelectasis or scarring.  Small effusions. Electronically Signed   By: Rolm Baptise M.D.   On: 02/13/2016 10:36   Dg Chest Portable 1 View  02/12/2016  CLINICAL DATA:  Shortness of breath and cough EXAM: PORTABLE CHEST 1 VIEW COMPARISON:  08/27/2015 FINDINGS:  Cardiac shadow is within normal limits. The lungs are hyperaerated bilaterally. Bilateral nipple shadows are seen. No bony abnormality is seen. IMPRESSION: COPD without acute abnormality. Electronically Signed   By: Inez Catalina M.D.   On: 02/12/2016 16:32        Scheduled Meds: . albuterol  2.5 mg Nebulization TID  . antiseptic oral rinse  7 mL Mouth Rinse q12n4p  . aspirin  81 mg Oral Daily  . azithromycin  500 mg Oral Q24H  . busPIRone  15 mg Oral BID  . cefTRIAXone (ROCEPHIN)  IV  1 g Intravenous Q24H  . chlorhexidine  15 mL Mouth Rinse BID  . Chlorhexidine Gluconate Cloth  6 each Topical Q0600  . enoxaparin (LOVENOX) injection  40 mg Subcutaneous QHS  . escitalopram  40 mg Oral Daily  . feeding supplement (ENSURE ENLIVE)  237 mL Oral TID BM  . mometasone-formoterol  2 puff Inhalation BID  . mupirocin ointment  1 application Nasal BID  . oseltamivir  75 mg Oral BID  . pantoprazole  80 mg Oral Daily  . predniSONE  40 mg Oral Q breakfast  . sodium chloride flush  3 mL Intravenous Q12H  . umeclidinium bromide  1 puff Inhalation Daily  . vancomycin  750 mg Intravenous Q24H   Continuous Infusions: . sodium chloride 75 mL/hr at 02/13/16 0152     LOS: 1 day    Cearra Portnoy, Orpah Melter, MD [facility] Pager [phone]  If 7PM-7AM, please contact night-coverage [email or web address] Password [facility] 02/13/2016, 4:57 PM

## 2016-02-13 NOTE — Care Management Note (Signed)
Case Management Note  Patient Details  Name: Amayah Staheli MRN: [id number] Date of Birth: 10/17/1951  Subjective/Objective:    Presents with Acute/chronic hypoxic resp failure and acidosis has copd, was on bipap, now on 4 liters for cxr today. NCM will cont to follow for dc needs.               Action/Plan:   Expected Discharge Date:                  Expected Discharge Plan:  [place]  In-House Referral:     Discharge planning Services  CM Consult  Post Acute Care Choice:    Choice offered to:     DME Arranged:    DME Agency:     HH Arranged:    [place] Agency:     Status of Service:  In process, will continue to follow  Medicare Important Message Given:    Date Medicare IM Given:    Medicare IM give by:    Date Additional Medicare IM Given:    Additional Medicare Important Message give by:     If discussed at [place] of Stay Meetings, dates discussed:    Additional Comments:  Zenon Mayo, RN 02/13/2016, 1:51 PM

## 2016-02-13 NOTE — Progress Notes (Signed)
Patient is currently on 4LNC with sats of 97%. All vitals are stable and patient is in no distress. BIPAP is in room on standby but is not needed at this time. Will monitor as needed.

## 2016-02-13 NOTE — Progress Notes (Signed)
[facility] lab called this RN to notify that Strep Pneumo Urinary Antigen that was previously reported negative should have been reported as positive. Will continue to monitor pt.

## 2016-02-14 LAB — CBC
HCT: 35.3 % — ABNORMAL LOW (ref 36.0–46.0)
HEMOGLOBIN: 11.3 g/dL — AB (ref 12.0–15.0)
MCH: 32.5 pg (ref 26.0–34.0)
MCHC: 32 g/dL (ref 30.0–36.0)
MCV: 101.4 fL — AB (ref 78.0–100.0)
Platelets: 256 10*3/uL (ref 150–400)
RBC: 3.48 MIL/uL — AB (ref 3.87–5.11)
RDW: 12.7 % (ref 11.5–15.5)
WBC: 15.5 10*3/uL — ABNORMAL HIGH (ref 4.0–10.5)

## 2016-02-14 LAB — URINE CULTURE

## 2016-02-14 LAB — LEGIONELLA PNEUMOPHILA SEROGP 1 UR AG: L. pneumophila Serogp 1 Ur Ag: NEGATIVE

## 2016-02-14 LAB — PROCALCITONIN: Procalcitonin: 2.02 ng/mL

## 2016-02-14 LAB — EXPECTORATED SPUTUM ASSESSMENT W GRAM STAIN, RFLX TO RESP C

## 2016-02-14 NOTE — Progress Notes (Signed)
PROGRESS NOTE    Megan [name]  ELF:[id number] DOB: 13-Feb-1951 DOA: 02/12/2016 PCP: Pcp Not In [place] Outpatient Specialists:     Brief Narrative:  65 y.o. female with a past medical history significant for COPD stage 4, FEV1 21% on home O2, HTN, and PTSD who presents with shortness of breath and fever   Assessment & Plan:   Principal Problem:   Acute respiratory failure with hypoxia and hypercarbia ([place]) Active Problems:   Essential hypertension   COPD exacerbation ([place])   Protein-calorie malnutrition, severe   Anxiety state   Chronic post-traumatic stress disorder (PTSD)   Esophageal reflux   1. Acute on chronic hypoxic and hypercapnic respiratory failure:  Baseline 2LNC prior to admission. Resp failure (evidenced by resp acidosis, hypoxia requiring BiPAP and respiratory distress on arrival) is likely from COPD flare, suspect viral trigger as well as anxiety.  -Followrespiratory virus panel - Flu neg -Trend lactic acid - normalized -Urine strep pneumo serology pos -Vancomycin given MRSA hx and severe underlying lung disease - Ceftriaxone (patient had only hives/itching, "many years ago") and azithromycin for now, pending above studies, de-escalate to azithromycin if able -Cont prednisone 40 mg daily -Clinically improving. Cont to wean O2 as tolerated  2. Acidosis:  Secondary to either lactate or respiratory acidosis -Resolved  3. Severe PCM:  -Ensure between meals  4. COPD Gold 4, high risk, FEV 21% at [facility] in 2015:  -Continue Advair, Incruse -Continue PPI -Short-acting bronchodilator as above  5. Tachycardia:  This appears to be sinus. Was previously on diltiazem for reported SVT at [facility] in 2014 or 2015, not currently. -Fluids and treat underlying illness  6. PTSD and anxiety:  -Continue alprazolam PRN for anxiety -Continue buspirone, escitalopram  7. HTN: Normotensive now. -Hold lisinopril given concern for sepsis, restart if  needed  8. Migraines: -Continue Fiorcet PRN   DVT prophylaxis: Lovenox Code Status: Full Family Communication: Patient in room Disposition Plan:Possible home with home health in 1-2 days  Consultants:     Procedures:    Antimicrobials:   Rocephin, azithromycin 4/13>>>   Subjective: Reports feeling better, still with increased mucus production  Objective: Filed Vitals:   02/14/16 0742 02/14/16 1308 02/14/16 1334 02/14/16 1500  BP: 102/54  136/62 122/71  Pulse: 110  124 98  Temp: 97.9 F (36.6 C)  97.7 F (36.5 C) 98 F (36.7 C)  TempSrc: Oral  Oral Oral  Resp: '12  21 16  '$ Height:      Weight:      SpO2: 96% 96% 92% 96%    Intake/Output Summary (Last 24 hours) at 02/14/16 1803 Last data filed at 02/14/16 1334  Gross per 24 hour  Intake 2777.5 ml  Output   1600 ml  Net 1177.5 ml   Filed Weights   02/12/16 2108  Weight: 57.9 kg (127 lb 10.3 oz)    Examination:  General exam: Appears calm and comfortable, sitting in bed Respiratory system: Normal resp effort, +rhonchi with trace end-expiratory wheezing Cardiovascular system: S1 & S2 heard, RRR. Gastrointestinal system: Abdomen is nondistended, soft and nontender. No organomegaly or masses felt. Normal bowel sounds heard. Central nervous system: Alert and oriented. No focal neurological deficits. Extremities: Symmetric 5 x 5 power. Skin: No rashes, lesions or ulcers Psychiatry: Judgement and insight appear normal. Mood & affect appropriate.     Data Reviewed: I have personally reviewed following labs and imaging studies  CBC:  Recent Labs Lab 02/12/16 1808 02/12/16 2212 02/13/16 0024 02/14/16 5852  WBC 31.0* 20.6* 20.6* 15.5*  NEUTROABS 25.7*  --   --   --   HGB 14.8 12.3 12.2 11.3*  HCT 43.9 37.6 36.5 35.3*  MCV 96.7 96.4 96.8 101.4*  PLT PLATELET CLUMPS NOTED ON SMEAR, UNABLE TO ESTIMATE 264 281 034   Basic Metabolic Panel:  Recent Labs Lab 02/12/16 1645 02/12/16 2212 02/13/16 0024   NA 139  --  136  K 4.4  --  4.1  CL 99*  --  100*  CO2 25  --  27  GLUCOSE 174*  --  186*  BUN 7  --  10  CREATININE 0.69 0.70 0.71  CALCIUM 9.1  --  8.5*   GFR: Estimated Creatinine Clearance: 59.5 mL/min (by C-G formula based on Cr of 0.71). Liver Function Tests:  Recent Labs Lab 02/12/16 1645  AST 28  ALT 16  ALKPHOS 69  BILITOT 0.8  PROT 6.7  ALBUMIN 4.0   No results for input(s): LIPASE, AMYLASE in the last 168 hours. No results for input(s): AMMONIA in the last 168 hours. Coagulation Profile: No results for input(s): INR, PROTIME in the last 168 hours. Cardiac Enzymes: No results for input(s): CKTOTAL, CKMB, CKMBINDEX, TROPONINI in the last 168 hours. BNP (last 3 results) No results for input(s): PROBNP in the last 8760 hours. HbA1C: No results for input(s): HGBA1C in the last 72 hours. CBG: No results for input(s): GLUCAP in the last 168 hours. Lipid Profile: No results for input(s): CHOL, HDL, LDLCALC, TRIG, CHOLHDL, LDLDIRECT in the last 72 hours. Thyroid Function Tests: No results for input(s): TSH, T4TOTAL, FREET4, T3FREE, THYROIDAB in the last 72 hours. Anemia Panel: No results for input(s): VITAMINB12, FOLATE, FERRITIN, TIBC, IRON, RETICCTPCT in the last 72 hours. Urine analysis:    Component Value Date/Time   COLORURINE YELLOW 02/12/2016 2026   APPEARANCEUR CLEAR 02/12/2016 2026   LABSPEC 1.020 02/12/2016 2026   PHURINE 6.0 02/12/2016 2026   GLUCOSEU 500* 02/12/2016 2026   HGBUR MODERATE* 02/12/2016 2026   BILIRUBINUR NEGATIVE 02/12/2016 2026   KETONESUR NEGATIVE 02/12/2016 2026   PROTEINUR 100* 02/12/2016 2026   UROBILINOGEN 0.2 08/25/2015 2130   NITRITE NEGATIVE 02/12/2016 2026   LEUKOCYTESUR NEGATIVE 02/12/2016 2026   Sepsis Labs: '@LABRCNTIP'$ (procalcitonin:4,lacticidven:4)  ) Recent Results (from the past 240 hour(s))  Blood Culture (routine x 2)     Status: None (Preliminary result)   Collection Time: 02/12/16  4:32 PM  Result Value  Ref Range Status   Specimen Description BLOOD RIGHT ANTECUBITAL  Final   Special Requests IN PEDIATRIC BOTTLE 1CC  Final   Culture NO GROWTH < 24 HOURS  Final   Report Status PENDING  Incomplete  Blood Culture (routine x 2)     Status: None (Preliminary result)   Collection Time: 02/12/16  4:44 PM  Result Value Ref Range Status   Specimen Description BLOOD RIGHT WRIST  Final   Special Requests IN PEDIATRIC BOTTLE 2CC  Final   Culture NO GROWTH < 24 HOURS  Final   Report Status PENDING  Incomplete  Urine culture     Status: None   Collection Time: 02/12/16  8:26 PM  Result Value Ref Range Status   Specimen Description URINE, RANDOM  Final   Special Requests NONE  Final   Culture MULTIPLE SPECIES PRESENT, SUGGEST RECOLLECTION  Final   Report Status 02/14/2016 FINAL  Final  MRSA PCR Screening     Status: Abnormal   Collection Time: 02/12/16  9:15 PM  Result Value Ref  Range Status   MRSA by PCR POSITIVE (A) NEGATIVE Final    Comment:        The GeneXpert MRSA Assay (FDA approved for NASAL specimens only), is one component of a comprehensive MRSA colonization surveillance program. It is not intended to diagnose MRSA infection nor to guide or monitor treatment for MRSA infections. RESULT CALLED TO, READ BACK BY AND VERIFIED WITH: C MOSELEY,RN '@2325'$  02/12/16 MKELLY   Culture, sputum-assessment     Status: None   Collection Time: 02/14/16  7:48 AM  Result Value Ref Range Status   Specimen Description SPUTUM  Final   Special Requests NONE  Final   Sputum evaluation THIS SPECIMEN IS ACCEPTABLE FOR SPUTUM CULTURE  Final   Report Status 02/14/2016 FINAL  Final         Radiology Studies: X-ray Chest Pa And Lateral  02/13/2016  CLINICAL DATA:  Cough, follow-up.  Prior smoker. EXAM: CHEST  2 VIEW COMPARISON:  02/12/2016 FINDINGS: There is hyperinflation of the lungs compatible with COPD. Linear scarring or atelectasis in the lung bases. Small bilateral pleural effusions. Heart is  normal size. No acute bony abnormality. IMPRESSION: COPD.  Bibasilar atelectasis or scarring.  Small effusions. Electronically Signed   By: Rolm Baptise M.D.   On: 02/13/2016 10:36        Scheduled Meds: . albuterol  2.5 mg Nebulization TID  . antiseptic oral rinse  7 mL Mouth Rinse q12n4p  . aspirin  81 mg Oral Daily  . azithromycin  500 mg Oral Q24H  . busPIRone  15 mg Oral BID  . cefTRIAXone (ROCEPHIN)  IV  1 g Intravenous Q24H  . chlorhexidine  15 mL Mouth Rinse BID  . Chlorhexidine Gluconate Cloth  6 each Topical Q0600  . enoxaparin (LOVENOX) injection  40 mg Subcutaneous QHS  . escitalopram  40 mg Oral Daily  . feeding supplement (ENSURE ENLIVE)  237 mL Oral TID BM  . mometasone-formoterol  2 puff Inhalation BID  . mupirocin ointment  1 application Nasal BID  . pantoprazole  80 mg Oral Daily  . predniSONE  40 mg Oral Q breakfast  . sodium chloride flush  3 mL Intravenous Q12H  . umeclidinium bromide  1 puff Inhalation Daily  . vancomycin  750 mg Intravenous Q24H   Continuous Infusions: . sodium chloride 75 mL/hr at 02/14/16 1200     LOS: 2 days    CHIU, Orpah Melter, MD [facility] Pager [phone]  If 7PM-7AM, please contact night-coverage [email or web address] Password [facility] 02/14/2016, 6:03 PM

## 2016-02-15 ENCOUNTER — Inpatient Hospital Stay (HOSPITAL_COMMUNITY): Payer: Medicare Other

## 2016-02-15 ENCOUNTER — Encounter (HOSPITAL_COMMUNITY): Payer: Self-pay | Admitting: Emergency Medicine

## 2016-02-15 DIAGNOSIS — J9601 Acute respiratory failure with hypoxia: Secondary | ICD-10-CM

## 2016-02-15 DIAGNOSIS — J9602 Acute respiratory failure with hypercapnia: Secondary | ICD-10-CM

## 2016-02-15 DIAGNOSIS — J441 Chronic obstructive pulmonary disease with (acute) exacerbation: Secondary | ICD-10-CM

## 2016-02-15 DIAGNOSIS — K210 Gastro-esophageal reflux disease with esophagitis: Secondary | ICD-10-CM

## 2016-02-15 DIAGNOSIS — J189 Pneumonia, unspecified organism: Secondary | ICD-10-CM

## 2016-02-15 LAB — MAGNESIUM: Magnesium: 1.6 mg/dL — ABNORMAL LOW (ref 1.7–2.4)

## 2016-02-15 LAB — GLUCOSE, CAPILLARY: Glucose-Capillary: 132 mg/dL — ABNORMAL HIGH (ref 65–99)

## 2016-02-15 LAB — TROPONIN I
Troponin I: 0.23 ng/mL — ABNORMAL HIGH (ref <0.031)
Troponin I: 0.04 ng/mL — ABNORMAL HIGH (ref <0.031)

## 2016-02-15 LAB — POCT I-STAT 3, ART BLOOD GAS (G3+)
Acid-Base Excess: 7 mmol/L — ABNORMAL HIGH (ref 0.0–2.0)
Bicarbonate: 37.6 mEq/L — ABNORMAL HIGH (ref 20.0–24.0)
O2 SAT: 100 %
PCO2 ART: 82.4 mmHg — AB (ref 35.0–45.0)
PH ART: 7.26 — AB (ref 7.350–7.450)
Patient temperature: 96.3
TCO2: 40 mmol/L (ref 0–100)
pO2, Arterial: 219 mmHg — ABNORMAL HIGH (ref 80.0–100.0)

## 2016-02-15 LAB — CBC
HCT: 37.6 % (ref 36.0–46.0)
Hemoglobin: 11.4 g/dL — ABNORMAL LOW (ref 12.0–15.0)
MCH: 30.7 pg (ref 26.0–34.0)
MCHC: 30.3 g/dL (ref 30.0–36.0)
MCV: 101.3 fL — ABNORMAL HIGH (ref 78.0–100.0)
PLATELETS: 281 10*3/uL (ref 150–400)
RBC: 3.71 MIL/uL — AB (ref 3.87–5.11)
RDW: 12.5 % (ref 11.5–15.5)
WBC: 12.6 10*3/uL — ABNORMAL HIGH (ref 4.0–10.5)

## 2016-02-15 LAB — BASIC METABOLIC PANEL
Anion gap: 12 (ref 5–15)
BUN: 11 mg/dL (ref 6–20)
CALCIUM: 8.5 mg/dL — AB (ref 8.9–10.3)
CO2: 35 mmol/L — ABNORMAL HIGH (ref 22–32)
CREATININE: 0.58 mg/dL (ref 0.44–1.00)
Chloride: 91 mmol/L — ABNORMAL LOW (ref 101–111)
GFR calc Af Amer: >60 mL/min (ref >60)
Glucose, Bld: 182 mg/dL — ABNORMAL HIGH (ref 65–99)
Potassium: 4.3 mmol/L (ref 3.5–5.1)
SODIUM: 138 mmol/L (ref 135–145)

## 2016-02-15 LAB — PHOSPHORUS: PHOSPHORUS: 3.8 mg/dL (ref 2.5–4.6)

## 2016-02-15 MED ORDER — FENTANYL CITRATE (PF) 100 MCG/2ML IJ SOLN
INTRAMUSCULAR | Status: AC
  Administered 2016-02-15: 100 ug
  Filled 2016-02-15: qty 4

## 2016-02-15 MED ORDER — FUROSEMIDE 10 MG/ML IJ SOLN
40.0000 mg | INTRAMUSCULAR | Status: AC
  Administered 2016-02-15: 40 mg via INTRAVENOUS

## 2016-02-15 MED ORDER — LORAZEPAM 2 MG/ML IJ SOLN
INTRAMUSCULAR | Status: AC
  Administered 2016-02-15: 0.5 mg
  Filled 2016-02-15: qty 1

## 2016-02-15 MED ORDER — MIDAZOLAM HCL 2 MG/2ML IJ SOLN
INTRAMUSCULAR | Status: AC
  Administered 2016-02-15: 4 mg
  Filled 2016-02-15: qty 4

## 2016-02-15 MED ORDER — IPRATROPIUM-ALBUTEROL 0.5-2.5 (3) MG/3ML IN SOLN
3.0000 mL | Freq: Four times a day (QID) | RESPIRATORY_TRACT | Status: DC
  Administered 2016-02-15 – 2016-02-20 (×17): 3 mL via RESPIRATORY_TRACT
  Filled 2016-02-15 (×20): qty 3

## 2016-02-15 MED ORDER — PHENYLEPHRINE HCL 10 MG/ML IJ SOLN
0.0000 ug/min | INTRAVENOUS | Status: DC
  Administered 2016-02-15: 20 ug/min via INTRAVENOUS
  Filled 2016-02-15: qty 1

## 2016-02-15 MED ORDER — FENTANYL CITRATE (PF) 100 MCG/2ML IJ SOLN
50.0000 ug | Freq: Once | INTRAMUSCULAR | Status: AC
  Administered 2016-02-15: 50 ug via INTRAVENOUS

## 2016-02-15 MED ORDER — FUROSEMIDE 10 MG/ML IJ SOLN
INTRAMUSCULAR | Status: AC
  Filled 2016-02-15: qty 4

## 2016-02-15 MED ORDER — BUDESONIDE 0.25 MG/2ML IN SUSP
0.2500 mg | Freq: Two times a day (BID) | RESPIRATORY_TRACT | Status: DC
Start: 2016-02-15 — End: 2016-02-21
  Administered 2016-02-15 – 2016-02-20 (×10): 0.25 mg via RESPIRATORY_TRACT
  Filled 2016-02-15 (×11): qty 2

## 2016-02-15 MED ORDER — FAMOTIDINE IN NACL 20-0.9 MG/50ML-% IV SOLN
20.0000 mg | Freq: Two times a day (BID) | INTRAVENOUS | Status: DC
  Administered 2016-02-15 – 2016-02-17 (×4): 20 mg via INTRAVENOUS
  Filled 2016-02-15 (×4): qty 50

## 2016-02-15 MED ORDER — MIDAZOLAM HCL 2 MG/2ML IJ SOLN
2.0000 mg | INTRAMUSCULAR | Status: DC | PRN
  Administered 2016-02-16 – 2016-02-17 (×7): 2 mg via INTRAVENOUS
  Filled 2016-02-15 (×3): qty 2

## 2016-02-15 MED ORDER — MIDAZOLAM HCL 2 MG/2ML IJ SOLN
2.0000 mg | INTRAMUSCULAR | Status: DC | PRN
  Filled 2016-02-15 (×4): qty 2

## 2016-02-15 MED ORDER — LORAZEPAM 2 MG/ML IJ SOLN
0.5000 mg | INTRAMUSCULAR | Status: DC | PRN
  Administered 2016-02-16 – 2016-02-17 (×2): 1 mg via INTRAVENOUS
  Filled 2016-02-15 (×2): qty 1

## 2016-02-15 MED ORDER — ALBUTEROL SULFATE (2.5 MG/3ML) 0.083% IN NEBU: 2.5000 mg | INHALATION_SOLUTION | Freq: Four times a day (QID) | RESPIRATORY_TRACT | Status: DC

## 2016-02-15 MED ORDER — SODIUM CHLORIDE 0.9 % IV SOLN
INTRAVENOUS | Status: DC | PRN
  Administered 2016-02-19: 06:00:00 via INTRA_ARTERIAL

## 2016-02-15 MED ORDER — FENTANYL BOLUS VIA INFUSION
50.0000 ug | INTRAVENOUS | Status: DC | PRN
  Administered 2016-02-15 – 2016-02-17 (×3): 50 ug via INTRAVENOUS
  Filled 2016-02-15: qty 50

## 2016-02-15 MED ORDER — ETOMIDATE 2 MG/ML IV SOLN
20.0000 mg | Freq: Once | INTRAVENOUS | Status: AC
  Administered 2016-02-15: 20 mg via INTRAVENOUS

## 2016-02-15 MED ORDER — ANTISEPTIC ORAL RINSE SOLUTION (CORINZ)
7.0000 mL | Freq: Four times a day (QID) | OROMUCOSAL | Status: DC
  Administered 2016-02-16 – 2016-02-19 (×14): 7 mL via OROMUCOSAL

## 2016-02-15 MED ORDER — METHYLPREDNISOLONE SODIUM SUCC 40 MG IJ SOLR
40.0000 mg | Freq: Four times a day (QID) | INTRAMUSCULAR | Status: DC
Start: 2016-02-15 — End: 2016-02-18
  Administered 2016-02-15 – 2016-02-18 (×12): 40 mg via INTRAVENOUS
  Filled 2016-02-15 (×13): qty 1

## 2016-02-15 MED ORDER — CHLORHEXIDINE GLUCONATE 0.12% ORAL RINSE (MEDLINE KIT)
15.0000 mL | Freq: Two times a day (BID) | OROMUCOSAL | Status: DC
  Administered 2016-02-15 – 2016-02-19 (×8): 15 mL via OROMUCOSAL

## 2016-02-15 MED ORDER — ROCURONIUM BROMIDE 50 MG/5ML IV SOLN
50.0000 mg | Freq: Once | INTRAVENOUS | Status: AC
  Administered 2016-02-15: 50 mg via INTRAVENOUS

## 2016-02-15 MED ORDER — SODIUM CHLORIDE 0.9 % IV SOLN
25.0000 ug/h | INTRAVENOUS | Status: DC
  Administered 2016-02-15: 50 ug/h via INTRAVENOUS
  Administered 2016-02-16: 175 ug/h via INTRAVENOUS
  Administered 2016-02-17: 100 ug/h via INTRAVENOUS
  Administered 2016-02-17: 175 ug/h via INTRAVENOUS
  Administered 2016-02-18: 150 ug/h via INTRAVENOUS
  Filled 2016-02-15 (×4): qty 50

## 2016-02-15 NOTE — Progress Notes (Signed)
[place] Progress Note Patient Name: Megan [name] DOB: Jun 02, 1951 MRN: [id number]   Date of Service  02/15/2016  HPI/Events of Note  Hypotension - BP = 89/63.  eICU Interventions  Will order: 1. Monitor CVP. 2. Phenylephrine IV infusion. Titrate to MAP >= 65.     Intervention Category Major Interventions: Hypotension - evaluation and management  Lysle Dingwall 02/15/2016, 7:16 PM

## 2016-02-15 NOTE — Progress Notes (Signed)
PROGRESS NOTE    Megan [name]  WGN:[id number] DOB: [birth year] DOA: 02/12/2016 PCP: Pcp Not In [place] [facility] Specialists:     Brief Narrative:  65 y.o. female with a past medical history significant for COPD stage 4, FEV1 21% on home O2, HTN, and PTSD who presents with shortness of breath and fever   Assessment & Plan:   Principal Problem:   Acute respiratory failure with hypoxia and hypercarbia ([place]) Active Problems:   Essential hypertension   COPD exacerbation ([place])   Protein-calorie malnutrition, severe   Anxiety state   Chronic post-traumatic stress disorder (PTSD)   Esophageal reflux   1. Acute on chronic hypoxic and hypercapnic respiratory failure:  Baseline 2LNC prior to admission. Resp failure (evidenced by resp acidosis, hypoxia requiring BiPAP and respiratory distress on arrival) is likely from COPD flare, suspect viral trigger as well as anxiety.  -Follow respiratory virus panel - Flu neg -Urine strep pneumo serology pos -Vancomycin given MRSA hx and severe underlying lung disease - Ceftriaxone (patient had only hives/itching, "many years ago") and azithromycin for now, pending above studies, de-escalate to azithromycin if able --Patient initially had been improving and was transferred to medical floor. This afternoon, patient was noted to have progressively worsening respiratory effort with decreased BS. Rapid response called. BiPAP initiated. Empiric '40mg'$  IV lasix given. STAT CXR ordered which later demonstrated hyperinflated lungs per my own read. STAT abg demonstrated pH of 7.1 with unmeasurable pCO2. D/c'd prednisone and transitioned to scheduled solumedrol with increased frequency of breathing tx. Discussed case with Critical Care. Transfer orders for SDU placed.  2. Acidosis:  --Initially resolved. This afternoon, developed resp acidosis secondary to above  3. Severe PCM:  -Ensure between meals  4. COPD Gold 4, high risk, FEV 21% at  [facility] in 2015:  -Continue Advair, Incruse -Continue PPI -Short-acting bronchodilator as above  5. Tachycardia:  This appears to be sinus. Was previously on diltiazem for reported SVT at [facility] in 2014 or 2015, not currently.  6. PTSD and anxiety:  -Continue alprazolam PRN for anxiety -Continue buspirone, escitalopram  7. HTN: Normotensive now. -Hold lisinopril given concern for sepsis, restart if needed  8. Migraines: -Continue Fiorcet PRN   DVT prophylaxis: Lovenox Code Status: Full Family Communication: Patient in room Disposition Plan:Transfer to ICU  Consultants:   PCCM  Procedures:    Antimicrobials:   Rocephin, azithromycin 4/13>>>   Subjective: This AM, complained of feeling nauseated. Later in afternoon, became markedly more sob  Objective: Filed Vitals:   02/15/16 0556 02/15/16 1414 02/15/16 1430 02/15/16 1457  BP: 168/79 162/72 158/78 153/97  Pulse: 116 124 120 120  Temp: 97.8 F (36.6 C)     TempSrc: Oral     Resp: 22 32 24 22  Height:      Weight:      SpO2: 92% 93% 100% 98%   No intake or output data in the 24 hours ending 02/15/16 1548 Filed Weights   02/12/16 2108  Weight: 57.9 kg (127 lb 10.3 oz)    Examination:  General exam: this AM, laying in bed, appears in mild discomfort Respiratory system: Mildly increased resp effort, decreased BS throughout Cardiovascular system: S1 & S2 heard, RRR. Gastrointestinal system: Abdomen is nondistended, soft and nontender. No organomegaly or masses felt. Normal bowel sounds heard. Central nervous system: Alert and oriented. No focal neurological deficits. Extremities: Symmetric 5 x 5 power. Skin: No rashes, lesions or ulcers Psychiatry: Judgement and insight appear normal. Mood & affect appropriate.  Data Reviewed: I have personally reviewed following labs and imaging studies  CBC:  Recent Labs Lab 02/12/16 1808 02/12/16 2212 02/13/16 0024 02/14/16 0636 02/15/16 0359  WBC 31.0*  20.6* 20.6* 15.5* 12.6*  NEUTROABS 25.7*  --   --   --   --   HGB 14.8 12.3 12.2 11.3* 11.4*  HCT 43.9 37.6 36.5 35.3* 37.6  MCV 96.7 96.4 96.8 101.4* 101.3*  PLT PLATELET CLUMPS NOTED ON SMEAR, UNABLE TO ESTIMATE 264 281 256 124   Basic Metabolic Panel:  Recent Labs Lab 02/12/16 1645 02/12/16 2212 02/13/16 0024  NA 139  --  136  K 4.4  --  4.1  CL 99*  --  100*  CO2 25  --  27  GLUCOSE 174*  --  186*  BUN 7  --  10  CREATININE 0.69 0.70 0.71  CALCIUM 9.1  --  8.5*   GFR: Estimated Creatinine Clearance: 59.5 mL/min (by C-G formula based on Cr of 0.71). Liver Function Tests:  Recent Labs Lab 02/12/16 1645  AST 28  ALT 16  ALKPHOS 69  BILITOT 0.8  PROT 6.7  ALBUMIN 4.0   No results for input(s): LIPASE, AMYLASE in the last 168 hours. No results for input(s): AMMONIA in the last 168 hours. Coagulation Profile: No results for input(s): INR, PROTIME in the last 168 hours. Cardiac Enzymes: No results for input(s): CKTOTAL, CKMB, CKMBINDEX, TROPONINI in the last 168 hours. BNP (last 3 results) No results for input(s): PROBNP in the last 8760 hours. HbA1C: No results for input(s): HGBA1C in the last 72 hours. CBG: No results for input(s): GLUCAP in the last 168 hours. Lipid Profile: No results for input(s): CHOL, HDL, LDLCALC, TRIG, CHOLHDL, LDLDIRECT in the last 72 hours. Thyroid Function Tests: No results for input(s): TSH, T4TOTAL, FREET4, T3FREE, THYROIDAB in the last 72 hours. Anemia Panel: No results for input(s): VITAMINB12, FOLATE, FERRITIN, TIBC, IRON, RETICCTPCT in the last 72 hours. Urine analysis:    Component Value Date/Time   COLORURINE YELLOW 02/12/2016 2026   APPEARANCEUR CLEAR 02/12/2016 2026   LABSPEC 1.020 02/12/2016 2026   PHURINE 6.0 02/12/2016 2026   GLUCOSEU 500* 02/12/2016 2026   HGBUR MODERATE* 02/12/2016 2026   BILIRUBINUR NEGATIVE 02/12/2016 2026   KETONESUR NEGATIVE 02/12/2016 2026   PROTEINUR 100* 02/12/2016 2026   UROBILINOGEN  0.2 08/25/2015 2130   NITRITE NEGATIVE 02/12/2016 2026   LEUKOCYTESUR NEGATIVE 02/12/2016 2026   Sepsis Labs: '@LABRCNTIP'$ (procalcitonin:4,lacticidven:4)  ) Recent Results (from the past 240 hour(s))  Blood Culture (routine x 2)     Status: None (Preliminary result)   Collection Time: 02/12/16  4:32 PM  Result Value Ref Range Status   Specimen Description BLOOD RIGHT ANTECUBITAL  Final   Special Requests IN PEDIATRIC BOTTLE 1CC  Final   Culture NO GROWTH 2 DAYS  Final   Report Status PENDING  Incomplete  Blood Culture (routine x 2)     Status: None (Preliminary result)   Collection Time: 02/12/16  4:44 PM  Result Value Ref Range Status   Specimen Description BLOOD RIGHT WRIST  Final   Special Requests IN PEDIATRIC BOTTLE 2CC  Final   Culture NO GROWTH 2 DAYS  Final   Report Status PENDING  Incomplete  Urine culture     Status: None   Collection Time: 02/12/16  8:26 PM  Result Value Ref Range Status   Specimen Description URINE, RANDOM  Final   Special Requests NONE  Final   Culture MULTIPLE SPECIES PRESENT,  SUGGEST RECOLLECTION  Final   Report Status 02/14/2016 FINAL  Final  MRSA PCR Screening     Status: Abnormal   Collection Time: 02/12/16  9:15 PM  Result Value Ref Range Status   MRSA by PCR POSITIVE (A) NEGATIVE Final    Comment:        The GeneXpert MRSA Assay (FDA approved for NASAL specimens only), is one component of a comprehensive MRSA colonization surveillance program. It is not intended to diagnose MRSA infection nor to guide or monitor treatment for MRSA infections. RESULT CALLED TO, READ BACK BY AND VERIFIED WITH: C MOSELEY,RN '@2325'$  02/12/16 MKELLY   Culture, sputum-assessment     Status: None   Collection Time: 02/14/16  7:48 AM  Result Value Ref Range Status   Specimen Description SPUTUM  Final   Special Requests NONE  Final   Sputum evaluation THIS SPECIMEN IS ACCEPTABLE FOR SPUTUM CULTURE  Final   Report Status 02/14/2016 FINAL  Final  Culture,  respiratory (NON-Expectorated)     Status: None (Preliminary result)   Collection Time: 02/14/16 10:02 AM  Result Value Ref Range Status   Specimen Description SPUTUM  Final   Special Requests NONE  Final   Gram Stain   Final    MODERATE WBC PRESENT, PREDOMINANTLY PMN RARE SQUAMOUS EPITHELIAL CELLS PRESENT MODERATE GRAM POSITIVE COCCI IN PAIRS RARE GRAM POSITIVE RODS Performed at [facility]    Culture   Final    Culture reincubated for better growth Performed at [facility]    Report Status PENDING  Incomplete         Radiology Studies: Dg Chest Port 1 View  02/15/2016  CLINICAL DATA:  Hypoxia.  Sepsis. EXAM: PORTABLE CHEST 1 VIEW COMPARISON:  02/13/2016 chest radiograph. FINDINGS: Stable cardiomediastinal silhouette with normal heart size. No pneumothorax. Stable small bilateral pleural effusions. Hyperinflated lungs. Emphysema. Patchy consolidation at the left greater than right lung bases, increased bilaterally. IMPRESSION: 1. Patchy consolidation at the left greater than right lung bases, increased bilaterally, most suggestive of multifocal pneumonia or aspiration. 2. Hyperinflated lungs and emphysema, suggesting COPD. 3. Stable small bilateral pleural effusions. Electronically Signed   By: Ilona Sorrel M.D.   On: 02/15/2016 14:25   Dg Abd Portable 1v  02/15/2016  CLINICAL DATA:  65 year old female with history of nausea and vomiting for 1 day. Hiatal hernia. EXAM: PORTABLE ABDOMEN - 1 VIEW COMPARISON:  No priors. FINDINGS: Gas and stool are seen scattered throughout the colon extending to the level of the distal rectum. No pathologic distension of small bowel is noted. No gross evidence of pneumoperitoneum. IMPRESSION: 1. Nonobstructive bowel gas pattern. 2. No pneumoperitoneum. Electronically Signed   By: Vinnie Langton M.D.   On: 02/15/2016 10:53        Scheduled Meds: . albuterol  2.5 mg Nebulization Q6H  . antiseptic oral rinse  7 mL Mouth Rinse  q12n4p  . aspirin  81 mg Oral Daily  . azithromycin  500 mg Oral Q24H  . busPIRone  15 mg Oral BID  . cefTRIAXone (ROCEPHIN)  IV  1 g Intravenous Q24H  . chlorhexidine  15 mL Mouth Rinse BID  . Chlorhexidine Gluconate Cloth  6 each Topical Q0600  . enoxaparin (LOVENOX) injection  40 mg Subcutaneous QHS  . escitalopram  40 mg Oral Daily  . feeding supplement (ENSURE ENLIVE)  237 mL Oral TID BM  . furosemide      . methylPREDNISolone (SOLU-MEDROL) injection  40 mg Intravenous Q6H  .  mometasone-formoterol  2 puff Inhalation BID  . mupirocin ointment  1 application Nasal BID  . pantoprazole  80 mg Oral Daily  . sodium chloride flush  3 mL Intravenous Q12H  . umeclidinium bromide  1 puff Inhalation Daily  . vancomycin  750 mg Intravenous Q24H   Continuous Infusions:     LOS: 3 days    Carolyn Maniscalco, Orpah Melter, MD [facility] Pager [phone]  If 7PM-7AM, please contact night-coverage [email or web address] Password [facility] 02/15/2016, 3:48 PM

## 2016-02-15 NOTE — Progress Notes (Signed)
Patient has COPD and her work of breathing continue to increase through out the day.  Around 1300 the patient began to show signes of altered LOC and continued SOB/air hungry .PT placed on 5L of o2 with little movement in her stats.  MD notified and rapid response called. Patient placed on bipap and ABG drawn. After being on bipap for a while patient began to perk up some but still drowsy.   Kupono Marling, Mervin Kung RN

## 2016-02-15 NOTE — Procedures (Signed)
Central Venous Catheter Insertion Procedure Note Liticia Gasior [id number] 06-03-1951  Procedure: Insertion of Central Venous Catheter Indications: Assessment of intravascular volume  Procedure Details Consent: Unable to obtain consent because of emergent medical necessity. Time Out: Verified patient identification, verified procedure, site/side was marked, verified correct patient position, special equipment/implants available, medications/allergies/relevent history reviewed, required imaging and test results available.  Performed  Maximum sterile technique was used including antiseptics, cap, gloves, gown, hand hygiene, mask and sheet. Skin prep: Chlorhexidine; local anesthetic administered A antimicrobial bonded/coated triple lumen catheter was placed in the left internal jugular vein using the Seldinger technique. Ultrasound guidance used.Yes.   Catheter placed to 20 cm. Blood aspirated via all 3 ports and then flushed x 3. Line sutured x 2 and dressing applied.  Evaluation Blood flow good Complications: No apparent complications Patient did tolerate procedure well. Chest X-ray ordered to verify placement.  CXR: pending.   E Emokpae R-3 IMTS  [name] ACNP [name] PCCM Pager [phone] till 3 pm If no answer page [phone] 02/15/2016, 4:46 PM   Procedure was performed by Dr [name] under my supervision. No complications noted. CXR reviewed [name], MD, PhD 02/15/2016, 5:37 PM [facility] [phone] or if no answer [phone]

## 2016-02-15 NOTE — Progress Notes (Addendum)
Pt arrived to 2S05 from 2W17 on BiPap. Poor air movement noted on auscultation.  Oxygen saturation maintaining at 100% however. ST 120s-140s. Hypertensive.  Awaiting CCM for plan of care. Will continue to monitor pt closely.

## 2016-02-15 NOTE — Progress Notes (Signed)
RT called for bipap by RR RN for pt in resp. Distress.  Pt was receiving neb tx by RN.  Pt placed on bipap due to WOB and decreased LOC.  Pt is tolerating bipap well, and within 5 min. Became more responsive and had less WOB.  ABG drawn.  RN at bedside.  RT will continue to monitor.   Pt has transfer orders to SDU.

## 2016-02-15 NOTE — Progress Notes (Signed)
ELink MD paged regarding continued hypotension despite fluid bolus. Orders received for Phenylephrine and CVP monitoring.  Orders initiated and oncoming RN informed of plan.

## 2016-02-15 NOTE — H&P (Signed)
PULMONARY / CRITICAL CARE MEDICINE   Name: Megan [name] MRN: [id number] DOB: Feb 24, 1951    ADMISSION DATE:  02/12/2016   CONSULTATION DATE:  02/15/16  REFERRING MD:  Dr. Kalman Jewels.  CHIEF COMPLAINT:  Sudden Increased respiratory distress  HISTORY OF PRESENT ILLNESS:   65 Y O admitted 4/13 with SOB, worsening cough- productive of spuutum, fevers- temp 102 here. Been managed for COPD exacerbation and PNA, started on CAP coverage with azithro and Ceftriaxone, Vanc was added for MRSA coverage with MRSA hx and concern for severe lung dx. On admission- PH- 7.29, with Pco2- 67.1, mild lactic acidosis that resolved, Flu PCR negative, leukocytosis- 20.6 that's down trending, Chest xray on admission not suggestive of PNA, and negative repeat Xray after hydration. Pt had intermitent BIPAP since admission, and then transferred to Tele yesterday. Pt today while getting Nebulizer treatment suddenly became SOB, with increased WOB and decreased LOC, with improvement after starting BIPAP. ABG drawn- Ph- 7.14, PCo2- Undetectable >100.  PCCM was asked to see.  PAST MEDICAL HISTORY :  She  has a past medical history of Myocardial infarction [facility]); CHF (congestive heart failure) ([place]); COPD (chronic obstructive pulmonary disease) ([place]); Shortness of breath dyspnea; Depression; GERD (gastroesophageal reflux disease); History of hiatal hernia; Headache; Anemia; Essential hypertension (08/19/2015); and Anxiety state (08/20/2015).  PAST SURGICAL HISTORY: She  has past surgical history that includes Cardiac catheterization and Cardiac catheterization (N/A, 08/26/2015).  Allergies  Allergen Reactions  . Penicillins Rash    Has patient had a PCN reaction causing immediate rash, facial/tongue/throat swelling, SOB or lightheadedness with hypotension: No Has patient had a PCN reaction causing severe rash involving mucus membranes or skin necrosis:NO Has patient had a PCN reaction that required hospitalization No Has  patient had a PCN reaction occurring within the last 10 years:NO If all of the above answers are "NO", then may proceed with Cephalosporin use.    No current facility-administered medications on file prior to encounter.   Current Outpatient Prescriptions on File Prior to Encounter  Medication Sig  . acetaminophen (TYLENOL) 500 MG tablet Take 500 mg by mouth every 6 (six) hours as needed for mild pain.  Marland Kitchen albuterol (PROVENTIL HFA;VENTOLIN HFA) 108 (90 BASE) MCG/ACT inhaler Inhale 1 puff into the lungs every 4 (four) hours as needed for wheezing or shortness of breath.  Marland Kitchen aspirin 81 MG chewable tablet Chew 81 mg by mouth daily.  . busPIRone (BUSPAR) 7.5 MG tablet Take 2 tablets (15 mg total) by mouth 2 (two) times daily.  . butalbital-acetaminophen-caffeine (FIORICET, ESGIC) 50-325-40 MG tablet Take 1 tablet by mouth 2 (two) times daily as needed for headache.  . Fluticasone-Salmeterol (ADVAIR) 500-50 MCG/DOSE AEPB Inhale 1 puff into the lungs 2 (two) times daily.  Marland Kitchen ipratropium-albuterol (DUONEB) 0.5-2.5 (3) MG/3ML SOLN Take 3 mLs by nebulization every 6 (six) hours as needed (shortness of breath).  . lidocaine (LIDODERM) 5 % Place 0.25 patches onto the skin daily as needed (pain). Remove & Discard patch within 12 hours or as directed by MD  . lisinopril (PRINIVIL,ZESTRIL) 2.5 MG tablet Take 2.5 mg by mouth daily.  . Multiple Vitamin (MULTIVITAMIN WITH MINERALS) TABS tablet Take 1 tablet by mouth daily.  . promethazine (PHENERGAN) 12.5 MG tablet Take 12.5 mg by mouth every 6 (six) hours as needed for nausea or vomiting.  Marland Kitchen Umeclidinium Bromide (INCRUSE ELLIPTA) 62.5 MCG/INH AEPB Inhale 1 puff into the lungs daily.  Marland Kitchen ALPRAZolam (XANAX) 0.25 MG tablet Take 1 tablet (0.25 mg total) by mouth  2 (two) times daily as needed for anxiety. (Patient not taking: Reported on 02/13/2016)  . butalbital-acetaminophen-caffeine (FIORICET) 50-325-40 MG tablet Take 1-2 tablets by mouth every 6 (six) hours as needed  for headache. (Patient not taking: Reported on 02/13/2016)  . escitalopram (LEXAPRO) 20 MG tablet Take 2 tablets (40 mg total) by mouth daily. (Patient not taking: Reported on 02/13/2016)    FAMILY HISTORY:  Her has no family status information on file.   SOCIAL HISTORY: She  reports that she quit smoking about 15 years ago. She has never used smokeless tobacco. She reports that she does not drink alcohol or use illicit drugs.  REVIEW OF SYSTEMS:   Pt on BIPAP, unable to obtain.  SUBJECTIVE:  Pt nods that she is feeling better after starting BIPAP. Mental status intact. She denies chest pain. No leg swelling. Persistent productive cough.  VITAL SIGNS: BP 168/79 mmHg  Pulse 105  Temp(Src) 97.8 F (36.6 C) (Oral)  Resp 24  Ht '5\' 3"'$  (1.6 m)  Wt 127 lb 10.3 oz (57.9 kg)  BMI 22.62 kg/m2  SpO2 100%  HEMODYNAMICS:    VENTILATOR SETTINGS:    INTAKE / OUTPUT: I/O last 3 completed shifts: In: 2627.5 [P.O.:1080; I.V.:1497.5; IV Piggyback:50] Out: 1600 [Urine:1600]  PHYSICAL EXAMINATION: General:  On Bipap, following commands, slightly drowsy. Neuro:  Alert, oriented, moving all extyremities HEENT:  Dry oral mucosa, Pupils equal, distended neck veins Cardiovascular:  Tachycardic, regular, no murmurs. Lungs: Marked reduced air entry bilat, even with BIPAP, no wheezes or crackles, appreciated. Abdomen:  Not tender, soft, flat. Musculoskeletal:  DP pulses 2+ bilaterally, no pedal edema. Skin:  No rash, warm.  LABS:  BMET  Recent Labs Lab 02/12/16 1645 02/12/16 2212 02/13/16 0024  NA 139  --  136  K 4.4  --  4.1  CL 99*  --  100*  CO2 25  --  27  BUN 7  --  10  CREATININE 0.69 0.70 0.71  GLUCOSE 174*  --  186*    Electrolytes  Recent Labs Lab 02/12/16 1645 02/13/16 0024  CALCIUM 9.1 8.5*    CBC  Recent Labs Lab 02/13/16 0024 02/14/16 0636 02/15/16 0359  WBC 20.6* 15.5* 12.6*  HGB 12.2 11.3* 11.4*  HCT 36.5 35.3* 37.6  PLT 281 256 281     Coag's No results for input(s): APTT, INR in the last 168 hours.  Sepsis Markers  Recent Labs Lab 02/12/16 2007 02/12/16 2212 02/13/16 0025 02/14/16 0636  LATICACIDVEN 3.45* 1.9 1.3  --   PROCALCITON  --  1.99  --  2.02    ABG  Recent Labs Lab 02/12/16 1709 02/13/16 0132 02/13/16 0553 02/15/16 1400  PHART 7.292* 7.265* 7.401 7.144*  PCO2ART 67.1* 67.8* 36.1  --   PO2ART 86.0 108* 138* 215*    Liver Enzymes  Recent Labs Lab 02/12/16 1645  AST 28  ALT 16  ALKPHOS 69  BILITOT 0.8  ALBUMIN 4.0    Cardiac Enzymes No results for input(s): TROPONINI, PROBNP in the last 168 hours.  Glucose No results for input(s): GLUCAP in the last 168 hours.  Imaging Dg Chest Port 1 View  02/15/2016  CLINICAL DATA:  Hypoxia.  Sepsis. EXAM: PORTABLE CHEST 1 VIEW COMPARISON:  02/13/2016 chest radiograph. FINDINGS: Stable cardiomediastinal silhouette with normal heart size. No pneumothorax. Stable small bilateral pleural effusions. Hyperinflated lungs. Emphysema. Patchy consolidation at the left greater than right lung bases, increased bilaterally. IMPRESSION: 1. Patchy consolidation at the left greater than right lung  bases, increased bilaterally, most suggestive of multifocal pneumonia or aspiration. 2. Hyperinflated lungs and emphysema, suggesting COPD. 3. Stable small bilateral pleural effusions. Electronically Signed   By: Ilona Sorrel M.D.   On: 02/15/2016 14:25   Dg Abd Portable 1v  02/15/2016  CLINICAL DATA:  65 year old female with history of nausea and vomiting for 1 day. Hiatal hernia. EXAM: PORTABLE ABDOMEN - 1 VIEW COMPARISON:  No priors. FINDINGS: Gas and stool are seen scattered throughout the colon extending to the level of the distal rectum. No pathologic distension of small bowel is noted. No gross evidence of pneumoperitoneum. IMPRESSION: 1. Nonobstructive bowel gas pattern. 2. No pneumoperitoneum. Electronically Signed   By: Vinnie Langton M.D.   On: 02/15/2016  10:53    STUDIES:  Chest Xray- 4/16-Patchy consolidation at the left greater than right lung bases, increased bilaterally, most suggestive of multifocal pneumonia or aspiration.   CULTURES: 02/12/16- Blood Cultures X2 >> NGTD 02/12/16- urine culture multiple species, suggest recollection. MRSA pcr 4/13 + 4/13 Resp Virus Panel  4/13 Influenza panel negative  ANTIBIOTICS: Vanc ceftriazone Azithro   SIGNIFICANT EVENTS: Admitted to hospital- Step down- 4/13  Transferred to ICU- 4/17  LINES/TUBES: Intubated- 4/16 CVL Left IJ- 4/16  DISCUSSION: 58 Y O F with PMH of severe COPD, been managed for COPD exacerbation and PNA. On CAP coverage- Ceftriazone and zithro, Vanc added for   ASSESSMENT / PLAN:  PULMONARY A: Acute hypercapneic respiratory failure Severe COPD Multiple pulmonary Lung Nodule P:   Intially on BIPAP Intubate ABG in 1 hr Stat Chest xray VAP bundle Daily SBTs Pulmonary hygiene Chest xray am Duonebs Q6H Budesonide Cont IV solumedrol 40 Q6H  CARDIOVASCULAR A:  Tachycardia Hypertensive Hypotensive Post intubation P:  Hold home bp meds, Lisinopril 2.5 mg at home [employer] Appears dehydrated, gentle fluids- N/s 100cc/hr. Troponin X3  RENAL A:   No acute issues P:   Hydrate with n/s Bmet daily MAg Phosp  GASTROINTESTINAL A:   No acute issues P:   NPO PEpcid  HEMATOLOGIC A:   Leukocytosis- likely due to infection P:  Trend  INFECTIOUS A:   CAP MRSA + P:   Ceft and Azithro Also on Vanc- Can d/c Cultures pending Urine step positve  ENDOCRINE A:   No acute issues P:   CBGs Q4H  NEUROLOGIC A:   No acute issues P:   RASS goal: 0 Fentanyl PRN Versed PRN  FAMILY  - Updates: Patient updated- 4/16. Family not present.  - Inter-disciplinary family meet or Palliative Care meeting due by:  4/22.  Bing Neighbors, MD. Graciella Freer. 02/15/2016, 2:57 PM [phone]

## 2016-02-15 NOTE — Progress Notes (Signed)
[facility] Progress Note Patient Name: Megan [name] DOB: 09-26-1951 MRN: [id number]   Date of Service  02/15/2016  HPI/Events of Note  Patient undergoing active diuresis. Request for Foley catheter.   eICU Interventions  Will order Foley catheter.      Intervention Category Minor Interventions: Routine modifications to care plan (e.g. PRN medications for pain, fever)  Megan [name] 02/15/2016, 3:36 PM

## 2016-02-15 NOTE — Progress Notes (Signed)
RSI medicines initiated for intubation. During administration of medicines, it was noted that distal to RAC fluid was accumulating. Normal saline was flowing during administration of medicine.  Raised area was not reddened or warm.  IV removed. Will continue to monitor pt closely.

## 2016-02-15 NOTE — Procedures (Signed)
Arterial Catheter Insertion Procedure Note Madelyne Millikan [id number] 1951-07-13  Procedure: Insertion of Arterial Catheter  Indications: Blood pressure monitoring and Frequent blood sampling  Procedure Details Consent: Unable to obtain consent because of altered level of consciousness. Time Out: Verified patient identification, verified procedure, site/side was marked, verified correct patient position, special equipment/implants available, medications/allergies/relevent history reviewed, required imaging and test results available.  Performed  Maximum sterile technique was used including antiseptics, cap, gloves, gown, hand hygiene, mask and sheet. Skin prep: Chlorhexidine; local anesthetic administered 20 gauge catheter was inserted into left radial artery using the Seldinger technique.  Evaluation Blood flow good; BP tracing good. Complications: No apparent complications.   Virgilio Frees 02/15/2016

## 2016-02-15 NOTE — Progress Notes (Signed)
Arterial Line insertion attempted x2 per two RTS, unsuccessful attempts. RN aware.

## 2016-02-15 NOTE — Procedures (Signed)
Intubation Procedure Note Megan [name] [id number] 1951/09/17  Procedure: Intubation Indications: Respiratory insufficiency  Procedure Details Consent: Unable to obtain consent because of emergent medical necessity. Time Out: Verified patient identification, verified procedure, site/side was marked, verified correct patient position, special equipment/implants available, medications/allergies/relevent history reviewed, required imaging and test results available.  Performed  MAC and 3 Medications:  Fentanyl 200 mcg Etomidate 20 mg Versed 4 mg NMB 50 mg rocuronium     Evaluation Hemodynamic Status: BP stable throughout; O2 sats: stable throughout Patient's Current Condition: stable Complications: No apparent complications Patient did tolerate procedure well. Chest X-ray ordered to verify placement.  CXR: pending.   Megan [name], R-3 IMTS   Megan [name] ACNP Megan [name] PCCM Pager [phone] till 3 pm If no answer page [phone] 02/15/2016, 4:44 PM  Procedure was performed by Dr Megan [name] under my supervision. No complications noted. CXR reviewed Megan Apo, MD, PhD 02/15/2016, 5:36 PM Megan [name] [phone] or if no answer [phone]

## 2016-02-15 NOTE — Progress Notes (Signed)
Pharmacy Antibiotic Note  Megan [name] is a 65 y.o. female admitted on 02/12/2016 with sepsis.  Pharmacy has been consulted for vancomycin dosing.  R/o sepsis. Afebrile, WBC trending down to 12, LA 1.3, PCT 2.02. Plan to narrow abx soon    Plan: Continue vancomycin '750mg'$  q24 hours Check trough in 24-48 hours if not stopped  Height: '5\' 3"'$  (160 cm) Weight: 127 lb 10.3 oz (57.9 kg) IBW/kg (Calculated) : 52.4  Temp (24hrs), Avg:98 F (36.7 C), Min:97.7 F (36.5 C), Max:98.4 F (36.9 C)   Recent Labs Lab 02/12/16 1645 02/12/16 1808 02/12/16 2007 02/12/16 2212 02/13/16 0024 02/13/16 0025 02/14/16 0636 02/15/16 0359  WBC  --  31.0*  --  20.6* 20.6*  --  15.5* 12.6*  CREATININE 0.69  --   --  0.70 0.71  --   --   --   LATICACIDVEN 2.80*  --  3.45* 1.9  --  1.3  --   --     Estimated Creatinine Clearance: 59.5 mL/min (by C-G formula based on Cr of 0.71).    Allergies  Allergen Reactions  . Penicillins Rash    Has patient had a PCN reaction causing immediate rash, facial/tongue/throat swelling, SOB or lightheadedness with hypotension: No Has patient had a PCN reaction causing severe rash involving mucus membranes or skin necrosis:NO Has patient had a PCN reaction that required hospitalization No Has patient had a PCN reaction occurring within the last 10 years:NO If all of the above answers are "NO", then may proceed with Cephalosporin use.    Antimicrobials this admission: Vanc 4/13 >> LVQ 4/13 x 1 Rocephin 4/14 >> Azithro 4/14 >> Tamiflu 4/13 >> 4/14  Dose adjustments this admission:   Microbiology results: 4/13 resp virus pending 4/13 MRSA positive 4/13 urine: NGF 4/13 blood x 2: NGTD 4/13 strep urine + 4/15 sputum: mixed>>reincubate  Thank you for allowing pharmacy to be a part of this patient's care.  Erin Hearing PharmD., BCPS Clinical Pharmacist Pager [phone] 02/15/2016 12:07 PM

## 2016-02-15 NOTE — Progress Notes (Signed)
ABG results given to Dr. Lamonte Sakai, orders received to titrate fio2.

## 2016-02-16 ENCOUNTER — Inpatient Hospital Stay (HOSPITAL_COMMUNITY): Payer: Medicare Other

## 2016-02-16 LAB — BASIC METABOLIC PANEL
ANION GAP: 10 (ref 5–15)
BUN: 16 mg/dL (ref 6–20)
CO2: 32 mmol/L (ref 22–32)
Calcium: 7.7 mg/dL — ABNORMAL LOW (ref 8.9–10.3)
Chloride: 98 mmol/L — ABNORMAL LOW (ref 101–111)
Creatinine, Ser: 0.7 mg/dL (ref 0.44–1.00)
GFR calc Af Amer: >60 mL/min (ref >60)
GFR calc non Af Amer: >60 mL/min (ref >60)
GLUCOSE: 114 mg/dL — AB (ref 65–99)
POTASSIUM: 4 mmol/L (ref 3.5–5.1)
Sodium: 140 mmol/L (ref 135–145)

## 2016-02-16 LAB — CBC
HEMATOCRIT: 32.1 % — AB (ref 36.0–46.0)
Hemoglobin: 10.1 g/dL — ABNORMAL LOW (ref 12.0–15.0)
MCH: 31.2 pg (ref 26.0–34.0)
MCHC: 31.5 g/dL (ref 30.0–36.0)
MCV: 99.1 fL (ref 78.0–100.0)
PLATELETS: 263 10*3/uL (ref 150–400)
RBC: 3.24 MIL/uL — AB (ref 3.87–5.11)
RDW: 12.2 % (ref 11.5–15.5)
WBC: 6.6 10*3/uL (ref 4.0–10.5)

## 2016-02-16 LAB — BLOOD GAS, ARTERIAL
Acid-Base Excess: 8.8 mmol/L — ABNORMAL HIGH (ref 0.0–2.0)
Bicarbonate: 37.6 meq/L — ABNORMAL HIGH (ref 20.0–24.0)
Delivery systems: POSITIVE
Drawn by: 213381
Expiratory PAP: 8
FIO2: 0.5
Inspiratory PAP: 16
O2 Saturation: 99.4 %
Patient temperature: 98.6
RATE: 16 {breaths}/min
TCO2: 41.1 mmol/L (ref 0–100)
pH, Arterial: 7.144 — CL (ref 7.350–7.450)
pO2, Arterial: 215 mmHg — ABNORMAL HIGH (ref 80.0–100.0)

## 2016-02-16 LAB — POCT I-STAT 3, ART BLOOD GAS (G3+)
Acid-Base Excess: 11 mmol/L — ABNORMAL HIGH (ref 0.0–2.0)
Bicarbonate: 36.8 mEq/L — ABNORMAL HIGH (ref 20.0–24.0)
O2 Saturation: 98 %
PCO2 ART: 57.9 mmHg — AB (ref 35.0–45.0)
PH ART: 7.413 (ref 7.350–7.450)
PO2 ART: 117 mmHg — AB (ref 80.0–100.0)
Patient temperature: 99.2
TCO2: 39 mmol/L (ref 0–100)

## 2016-02-16 LAB — GLUCOSE, CAPILLARY
GLUCOSE-CAPILLARY: 106 mg/dL — AB (ref 65–99)
GLUCOSE-CAPILLARY: 112 mg/dL — AB (ref 65–99)
GLUCOSE-CAPILLARY: 130 mg/dL — AB (ref 65–99)
GLUCOSE-CAPILLARY: 177 mg/dL — AB (ref 65–99)

## 2016-02-16 LAB — CULTURE, RESPIRATORY W GRAM STAIN: Culture: NORMAL

## 2016-02-16 LAB — PHOSPHORUS: Phosphorus: 2.2 mg/dL — ABNORMAL LOW (ref 2.5–4.6)

## 2016-02-16 LAB — TROPONIN I: TROPONIN I: 0.04 ng/mL — AB (ref <0.031)

## 2016-02-16 LAB — RESPIRATORY VIRUS PANEL
Adenovirus: NEGATIVE
Influenza A: NEGATIVE
Influenza B: NEGATIVE
Metapneumovirus: NEGATIVE
PARAINFLUENZA 2 A: NEGATIVE
Parainfluenza 1: NEGATIVE
Parainfluenza 3: NEGATIVE
RESPIRATORY SYNCYTIAL VIRUS A: NEGATIVE
RESPIRATORY SYNCYTIAL VIRUS B: NEGATIVE
RHINOVIRUS: NEGATIVE

## 2016-02-16 LAB — MAGNESIUM: Magnesium: 1.5 mg/dL — ABNORMAL LOW (ref 1.7–2.4)

## 2016-02-16 MED ORDER — MAGNESIUM SULFATE 2 GM/50ML IV SOLN
2.0000 g | Freq: Once | INTRAVENOUS | Status: AC
  Administered 2016-02-16: 2 g via INTRAVENOUS
  Filled 2016-02-16: qty 50

## 2016-02-16 MED ORDER — POTASSIUM PHOSPHATES 15 MMOLE/5ML IV SOLN
30.0000 mmol | Freq: Once | INTRAVENOUS | Status: AC
  Administered 2016-02-16: 30 mmol via INTRAVENOUS
  Filled 2016-02-16: qty 10

## 2016-02-16 NOTE — Progress Notes (Signed)
CRITICAL VALUE ALERT  Critical value received: pCO2 57  Date of notification:  02/16/16  Time of notification:  0500  Critical value read back:Yes.    Nurse who received alert:  Rhina Brackett  MD notified (1st page):  Dr. Vaughan Browner  Time of first page:  Faythe Casa  MD notified (2nd page):  Time of second page:  Responding MD:  Dr. Vaughan Browner  Time MD responded:  [phone]

## 2016-02-16 NOTE — Progress Notes (Signed)
[facility] Progress Note Patient Name: Megan [name] DOB: April 23, 1951 MRN: [id number]   Date of Service  02/16/2016  HPI/Events of Note  Labs and ABG reviewed  eICU Interventions  No change to vent. Replete Phos and Mg     Intervention Category Evaluation Type: Other  Megan [name] 02/16/2016, 5:08 AM

## 2016-02-16 NOTE — Progress Notes (Signed)
°   02/16/16 0700  Clinical Encounter Type  Visited With Family;Patient  Visit Type Spiritual support  Referral From Nurse  Consult/Referral To Chaplain  Spiritual Encounters  Spiritual Needs Prayer;Emotional  Stress Factors  Patient Stress Factors Lack of knowledge;Loss of control  Family Stress Factors Health changes;Lack of knowledge;Loss of control;Major life changes  Pt.'s daughter was visibly upset over the health condition of the pt. Offered prayer and support.

## 2016-02-16 NOTE — Progress Notes (Signed)
PULMONARY / CRITICAL CARE MEDICINE   Name: Megan [name] MRN: [id number] DOB: April 23, 1951    ADMISSION DATE:  02/12/2016   CONSULTATION DATE:  02/15/16  REFERRING MD:  Dr. Kalman Jewels.  CHIEF COMPLAINT:  Sudden Increased respiratory distress  HISTORY OF PRESENT ILLNESS:   65 Y O admitted 4/13 with SOB, worsening cough- productive of spuutum, fevers- temp 102 here. Been managed for COPD exacerbation and PNA, started on CAP coverage with azithro and Ceftriaxone, Vanc was added for MRSA coverage with MRSA hx and concern for severe lung dx. On admission- PH- 7.29, with Pco2- 67.1, mild lactic acidosis that resolved, Flu PCR negative, leukocytosis- 20.6 that's down trending, Chest xray on admission not suggestive of PNA, and negative repeat Xray after hydration. Pt had intermitent BIPAP since admission, and then transferred to Tele yesterday. Pt today while getting Nebulizer treatment suddenly became SOB, with increased WOB and decreased LOC, with improvement after starting BIPAP. ABG drawn- Ph- 7.14, PCo2- Undetectable >100.  PCCM was asked to see.   SUBJECTIVE:  Sedated on fent gtt Arouses easy with anxious stare & dilated pupils, startled look, bolt upright Febrile overnight On neo gtt  VITAL SIGNS: BP 140/85 mmHg  Pulse 108  Temp(Src) 99.1 F (37.3 C) (Oral)  Resp 12  Ht '5\' 3"'$  (1.6 m)  Wt 127 lb 10.3 oz (57.9 kg)  BMI 22.62 kg/m2  SpO2 100%  HEMODYNAMICS: CVP:  [4 mmHg-90 mmHg] 83 mmHg  VENTILATOR SETTINGS: Vent Mode:  [-] PRVC FiO2 (%):  [40 %-50 %] 40 % Set Rate:  [12 bmp] 12 bmp Vt Set:  [500 mL] 500 mL PEEP:  [5 cmH20] 5 cmH20 Plateau Pressure:  [19 cmH20-31 cmH20] 25 cmH20  INTAKE / OUTPUT: I/O last 3 completed shifts: In: 588.5 [I.V.:265.5; NG/GT:90; IV Piggyback:233] Out: 2355 [Urine:2355]  PHYSICAL EXAMINATION: General:   Acutely ill, frail looking, anxious affect Neuro: sedated, moving all extremities to command HEENT:  Dry oral mucosa, Pupils equal,  distended neck veins Cardiovascular:  Tachycardic, regular, no murmurs. Lungs: Marked reduced air entry bilat, even , no wheezes or crackles, appreciated. Abdomen:  Not tender, soft, flat. Musculoskeletal:  DP pulses 2+ bilaterally, no pedal edema. Skin:  No rash, warm.  LABS:  BMET  Recent Labs Lab 02/13/16 0024 02/15/16 1439 02/16/16 0400  NA 136 138 140  K 4.1 4.3 4.0  CL 100* 91* 98*  CO2 27 35* 32  BUN '10 11 16  '$ CREATININE 0.71 0.58 0.70  GLUCOSE 186* 182* 114*    Electrolytes  Recent Labs Lab 02/13/16 0024 02/15/16 1439 02/16/16 0400  CALCIUM 8.5* 8.5* 7.7*  MG  --  1.6* 1.5*  PHOS  --  3.8 2.2*    CBC  Recent Labs Lab 02/14/16 0636 02/15/16 0359 02/16/16 0400  WBC 15.5* 12.6* 6.6  HGB 11.3* 11.4* 10.1*  HCT 35.3* 37.6 32.1*  PLT 256 281 263    Coag's No results for input(s): APTT, INR in the last 168 hours.  Sepsis Markers  Recent Labs Lab 02/12/16 2007 02/12/16 2212 02/13/16 0025 02/14/16 0636  LATICACIDVEN 3.45* 1.9 1.3  --   PROCALCITON  --  1.99  --  2.02    ABG  Recent Labs Lab 02/15/16 1400 02/15/16 1739 02/16/16 0458  PHART 7.144* 7.260* 7.413  PCO2ART CRITICAL RESULT CALLED TO, READ BACK BY AND VERIFIED WITH: 82.4* 57.9*  PO2ART 215* 219.0* 117.0*    Liver Enzymes  Recent Labs Lab 02/12/16 1645  AST 28  ALT 16  ALKPHOS  69  BILITOT 0.8  ALBUMIN 4.0    Cardiac Enzymes  Recent Labs Lab 02/15/16 1439 02/15/16 2112 02/16/16 0225  TROPONINI 0.23* 0.04* 0.04*    Glucose  Recent Labs Lab 02/15/16 1946 02/16/16 0030 02/16/16 0746  GLUCAP 132* 106* 112*    Imaging Dg Chest Port 1 View  02/16/2016  CLINICAL DATA:  Acute respiratory failure EXAM: PORTABLE CHEST - 1 VIEW COMPARISON:  02/15/2016 FINDINGS: Endotracheal tube, nasogastric tube, left IJ central line stable in position. Lungs are hyperinflated with attenuated peripheral bronchovascular markings. Small left pleural effusion with some increase in  consolidation/ atelectasis in the basilar segments left lower lobe. No pneumothorax. Visualized skeletal structures are unremarkable. IMPRESSION: 1. Small left effusion with adjacent infiltrate or atelectasis slightly increased since previous. Electronically Signed   By: Lucrezia Europe M.D.   On: 02/16/2016 08:16   Dg Chest Port 1 View  02/15/2016  CLINICAL DATA:  Acute respiratory failure, central line placement EXAM: PORTABLE CHEST 1 VIEW COMPARISON:  02/15/2016 FINDINGS: Cardiac shadow is stable. A new left jugular central line is noted with the catheter tip in the mid superior vena cava. No pneumothorax is seen. Small left-sided pleural effusion is again noted. The endotracheal tube and nasogastric catheter are stable. The lungs remain hyperinflated. Patchy changes are again seen in the bases but less prominent than on the recent exam. IMPRESSION: No pneumothorax following central line placement. Some improved aeration in the bases bilaterally. Electronically Signed   By: Inez Catalina M.D.   On: 02/15/2016 17:36   Dg Chest Port 1 View  02/15/2016  CLINICAL DATA:  Hypoxia.  Sepsis. EXAM: PORTABLE CHEST 1 VIEW COMPARISON:  02/13/2016 chest radiograph. FINDINGS: Stable cardiomediastinal silhouette with normal heart size. No pneumothorax. Stable small bilateral pleural effusions. Hyperinflated lungs. Emphysema. Patchy consolidation at the left greater than right lung bases, increased bilaterally. IMPRESSION: 1. Patchy consolidation at the left greater than right lung bases, increased bilaterally, most suggestive of multifocal pneumonia or aspiration. 2. Hyperinflated lungs and emphysema, suggesting COPD. 3. Stable small bilateral pleural effusions. Electronically Signed   By: Ilona Sorrel M.D.   On: 02/15/2016 14:25    STUDIES:  Chest Xray- 4/16-Patchy consolidation at the left greater than right lung bases, increased bilaterally, most suggestive of multifocal pneumonia or aspiration.   CULTURES: 02/12/16-  Blood Cultures X2 >> NGTD 02/12/16- urine culture multiple species, suggest recollection. MRSA pcr 4/13 + 4/13 Resp Virus Panel  4/13 Influenza panel negative  ANTIBIOTICS: Vanc ceftriazone Azithro   SIGNIFICANT EVENTS: Admitted to hospital- Step down- 4/13  Transferred to ICU- 4/17  LINES/TUBES: Intubated- 4/16 CVL Left IJ- 4/16 >>  DISCUSSION: 43 Y O F with PMH of severe COPD, been managed for COPD exacerbation and PNA. On CAP coverage- Ceftriazone and zithro, Vanc added for   ASSESSMENT / PLAN:  PULMONARY A: Acute hypercapneic respiratory failure Severe COPD Multiple pulmonary Lung Nodule P:   VAP bundle Daily SBTs Pulmonary hygiene Budesonide Cont IV solumedrol 40 Q6H  CARDIOVASCULAR A:  Tachycardia Hypertensive Hypotensive Post intubation Trop neg P:  Hold home bp meds, Lisinopril 2.5 mg at home  gentle fluids- N/s 100cc/hr.   RENAL A:   Metabolic alkalosis , compensatory hypomag P:   Replete lytes as needed Bmet daily   GASTROINTESTINAL A:  Thin P:   NPO PEpcid Start TFs  HEMATOLOGIC A:   Leukocytosis- likely due to infection, resolved P:  Trend  INFECTIOUS A:   CAP MRSA + P:   Ceft and Azithro  Also on Vanc- Can d/c if resp cx neg  Cultures pending Urine step positve  ENDOCRINE A:   No acute issues P:   CBGs Q4H  NEUROLOGIC A:   No acute issues P:   RASS goal: 0 Fentanyl gtt Versed PRN Resume home meds - xanax  buspar  FAMILY  - Updates:  Family not present.  - Inter-disciplinary family meet or Palliative Care meeting due by:  4/22. The patient is critically ill with multiple organ systems failure and requires high complexity decision making for assessment and support, frequent evaluation and titration of therapies, application of advanced monitoring technologies and extensive interpretation of multiple databases. Critical Care Time devoted to patient care services described in this note independent of APP time is  35 minutes.   Kara Mead MD. Shade Flood. [facility] Pager [phone] If no response call [phone]   02/16/2016   02/16/2016, 10:57 AM

## 2016-02-16 NOTE — Progress Notes (Signed)
[facility] Progress Note Patient Name: Megan [name] DOB: 26-Sep-1951 MRN: [id number]   Date of Service  02/16/2016  HPI/Events of Note  Sinus tachycardia, CVP 5  eICU Interventions  Bolus 500cc fluids.      Intervention Category Intermediate Interventions: Other:  Megan [name] 02/16/2016, 2:53 AM

## 2016-02-17 ENCOUNTER — Inpatient Hospital Stay (HOSPITAL_COMMUNITY): Payer: Medicare Other

## 2016-02-17 LAB — BASIC METABOLIC PANEL
Anion gap: 10 (ref 5–15)
BUN: 15 mg/dL (ref 6–20)
CALCIUM: 8.2 mg/dL — AB (ref 8.9–10.3)
CO2: 33 mmol/L — AB (ref 22–32)
CREATININE: 0.65 mg/dL (ref 0.44–1.00)
Chloride: 98 mmol/L — ABNORMAL LOW (ref 101–111)
GFR calc non Af Amer: >60 mL/min (ref >60)
Glucose, Bld: 130 mg/dL — ABNORMAL HIGH (ref 65–99)
Potassium: 4.3 mmol/L (ref 3.5–5.1)
SODIUM: 141 mmol/L (ref 135–145)

## 2016-02-17 LAB — CBC
HCT: 33.2 % — ABNORMAL LOW (ref 36.0–46.0)
Hemoglobin: 10.6 g/dL — ABNORMAL LOW (ref 12.0–15.0)
MCH: 31.5 pg (ref 26.0–34.0)
MCHC: 31.9 g/dL (ref 30.0–36.0)
MCV: 98.5 fL (ref 78.0–100.0)
PLATELETS: 241 10*3/uL (ref 150–400)
RBC: 3.37 MIL/uL — AB (ref 3.87–5.11)
RDW: 12.3 % (ref 11.5–15.5)
WBC: 6.8 10*3/uL (ref 4.0–10.5)

## 2016-02-17 LAB — GLUCOSE, CAPILLARY
GLUCOSE-CAPILLARY: 123 mg/dL — AB (ref 65–99)
GLUCOSE-CAPILLARY: 149 mg/dL — AB (ref 65–99)
Glucose-Capillary: 137 mg/dL — ABNORMAL HIGH (ref 65–99)

## 2016-02-17 LAB — TROPONIN I: Troponin I: <0.03 ng/mL (ref <0.031)

## 2016-02-17 LAB — CULTURE, BLOOD (ROUTINE X 2)
CULTURE: NO GROWTH
Culture: NO GROWTH

## 2016-02-17 MED ORDER — ALPRAZOLAM 0.25 MG PO TABS
0.2500 mg | ORAL_TABLET | Freq: Two times a day (BID) | ORAL | Status: DC
  Administered 2016-02-17 – 2016-02-18 (×2): 0.25 mg via ORAL
  Filled 2016-02-17 (×2): qty 1

## 2016-02-17 MED ORDER — PANTOPRAZOLE SODIUM 40 MG PO PACK
40.0000 mg | PACK | Freq: Every day | ORAL | Status: DC
  Administered 2016-02-17: 40 mg
  Filled 2016-02-17 (×2): qty 20

## 2016-02-17 MED ORDER — SODIUM CHLORIDE 0.9 % IV BOLUS (SEPSIS)
500.0000 mL | Freq: Once | INTRAVENOUS | Status: AC
  Administered 2016-02-17: 500 mL via INTRAVENOUS

## 2016-02-17 MED ORDER — DEXMEDETOMIDINE HCL IN NACL 200 MCG/50ML IV SOLN
0.4000 ug/kg/h | INTRAVENOUS | Status: DC
  Administered 2016-02-17: 0.4 ug/kg/h via INTRAVENOUS
  Administered 2016-02-17: 0.3 ug/kg/h via INTRAVENOUS
  Administered 2016-02-17 – 2016-02-18 (×4): 0.4 ug/kg/h via INTRAVENOUS
  Filled 2016-02-17 (×6): qty 50

## 2016-02-17 NOTE — Progress Notes (Addendum)
[facility] Progress Note Patient Name: Megan [name] DOB: 1951/01/17 MRN: [id number]   Date of Service  02/17/2016  HPI/Events of Note  HR 130- 150, BP stable, Sinus rhythm on EKG  eICU Interventions  Check troponin, NS 500cc     Intervention Category Intermediate Interventions: Arrhythmia - evaluation and management  Azura Tufaro 02/17/2016, 4:08 AM

## 2016-02-17 NOTE — Progress Notes (Signed)
PULMONARY / CRITICAL CARE MEDICINE   Name: Megan [name] MRN: [id number] DOB: Mar 09, 1951    ADMISSION DATE:  02/12/2016   CONSULTATION DATE:  02/15/16  REFERRING MD:  Dr. Kalman Jewels.  CHIEF COMPLAINT:  Sudden Increased respiratory distress  HISTORY OF PRESENT ILLNESS:   10 Y O admitted 4/13 with SOB, worsening cough- productive of spuutum, fevers- temp 102 here. Been managed for COPD exacerbation and PNA, started on CAP coverage with azithro and Ceftriaxone, Vanc was added for MRSA coverage with MRSA hx and concern for severe lung dx. On admission- PH- 7.29, with Pco2- 67.1, mild lactic acidosis that resolved, Flu PCR negative, leukocytosis- 20.6 that's down trending, Chest xray on admission not suggestive of PNA, and negative repeat Xray after hydration. Pt had intermitent BIPAP since admission, and then transferred to Tele yesterday. Pt today while getting Nebulizer treatment suddenly became SOB, with increased WOB and decreased LOC, with improvement after starting BIPAP. ABG drawn- Ph- 7.14, PCo2- Undetectable >100.  PCCM was asked to see.   SUBJECTIVE:  Remains critically ill, intubated Sedated on fent gtt, Arouses easy with anxiety afebrile overnight Off neo gtt  VITAL SIGNS: BP 137/82 mmHg  Pulse 101  Temp(Src) 98.9 F (37.2 C) (Oral)  Resp 14  Ht '5\' 3"'$  (1.6 m)  Wt 238 lb 5.1 oz (108.1 kg)  BMI 42.23 kg/m2  SpO2 98%  HEMODYNAMICS: CVP:  [3 mmHg-7 mmHg] 6 mmHg  VENTILATOR SETTINGS: Vent Mode:  [-] PRVC FiO2 (%):  [40 %] 40 % Set Rate:  [12 bmp-14 bmp] 14 bmp Vt Set:  [400 mL-500 mL] 400 mL PEEP:  [5 cmH20] 5 cmH20 Pressure Support:  [5 cmH20] 5 cmH20 Plateau Pressure:  [19 cmH20-24 cmH20] 22 cmH20  INTAKE / OUTPUT: I/O last 3 completed shifts: In: 1862.2 [I.V.:734.2; Other:115; NG/GT:180; IV [id number]:570] Out: 2685 [Urine:2485; Emesis/NG output:200]  PHYSICAL EXAMINATION: General:   Acutely ill, frail looking, anxious affect Neuro: sedated, moving all  extremities to command HEENT:  Dry oral mucosa, Pupils equal, distended neck veins Cardiovascular:  Tachycardic, regular, no murmurs. Lungs: Marked reduced air entry bilat, even , no wheezes or crackles, appreciated. Abdomen:  Not tender, soft, flat. Musculoskeletal:  DP pulses 2+ bilaterally, no pedal edema. Skin:  No rash, warm.  LABS:  BMET  Recent Labs Lab 02/15/16 1439 02/16/16 0400 02/17/16 0405  NA 138 140 141  K 4.3 4.0 4.3  CL 91* 98* 98*  CO2 35* 32 33*  BUN '11 16 15  '$ CREATININE 0.58 0.70 0.65  GLUCOSE 182* 114* 130*    Electrolytes  Recent Labs Lab 02/15/16 1439 02/16/16 0400 02/17/16 0405  CALCIUM 8.5* 7.7* 8.2*  MG 1.6* 1.5*  --   PHOS 3.8 2.2*  --     CBC  Recent Labs Lab 02/15/16 0359 02/16/16 0400 02/17/16 0405  WBC 12.6* 6.6 6.8  HGB 11.4* 10.1* 10.6*  HCT 37.6 32.1* 33.2*  PLT 281 263 241    Coag's No results for input(s): APTT, INR in the last 168 hours.  Sepsis Markers  Recent Labs Lab 02/12/16 2007 02/12/16 2212 02/13/16 0025 02/14/16 0636  LATICACIDVEN 3.45* 1.9 1.3  --   PROCALCITON  --  1.99  --  2.02    ABG  Recent Labs Lab 02/15/16 1400 02/15/16 1739 02/16/16 0458  PHART 7.144* 7.260* 7.413  PCO2ART CRITICAL RESULT CALLED TO, READ BACK BY AND VERIFIED WITH: 82.4* 57.9*  PO2ART 215* 219.0* 117.0*    Liver Enzymes  Recent Labs Lab 02/12/16 1645  AST 28  ALT 16  ALKPHOS 69  BILITOT 0.8  ALBUMIN 4.0    Cardiac Enzymes  Recent Labs Lab 02/16/16 0225 02/17/16 0405 02/17/16 0940  TROPONINI 0.04* <0.03 <0.03    Glucose  Recent Labs Lab 02/15/16 1946 02/16/16 0030 02/16/16 0746 02/16/16 1139 02/16/16 1615 02/16/16 2351  GLUCAP 132* 106* 112* 177* 130* 123*    Imaging Dg Chest Port 1 View  02/17/2016  CLINICAL DATA:  Hypoxia EXAM: PORTABLE CHEST 1 VIEW COMPARISON:  February 16, 2016 FINDINGS: Endotracheal tube tip is 2.9 cm above the carina. Nasogastric tube tip and side port are in the  stomach. Central catheter tip is in the superior vena cava. No pneumothorax. There is a persistent small left pleural effusion. There is atelectatic change in the lung bases, more on the left than on the right, stable. No new opacity is evident. Heart size and pulmonary vascular normal. No adenopathy. IMPRESSION: Tube and catheter positions as described without apparent pneumothorax. Left effusion with bibasilar atelectasis, more on the left than on the right, stable. No new opacity. No change in cardiac silhouette. Electronically Signed   By: Lowella Grip III M.D.   On: 02/17/2016 08:03    STUDIES:  Echo 08/2015 nml LVEF, RVSP 42  CULTURES: 02/12/16- Blood Cultures X2 >> NGTD 02/12/16- urine culture multiple species, suggest recollection. MRSA pcr 4/13 + 4/13 Resp Virus Panel >> neg 4/13 Influenza panel negative  ANTIBIOTICS: Vanc ceftriazone Azithro   SIGNIFICANT EVENTS: Admitted to hospital- Step down- 4/13  Transferred to ICU- 4/17  LINES/TUBES: Intubated- 4/16 CVL Left IJ- 4/16 >>  DISCUSSION: 65 Y O F with PMH of severe COPD, been managed for COPD exacerbation and PNA.   ASSESSMENT / PLAN:  PULMONARY A: Acute hypercapneic respiratory failure Severe COPD Multiple pulmonary Lung Nodule P:   VAP bundle Daily SBTs -need better control of anxiety Pulmonary hygiene Budesonide Cont IV solumedrol 40 Q6H  CARDIOVASCULAR A:  Tachycardia when aroused Hypertensive Hypotensive Post intubation Trop neg P:  Hold home bp meds, Lisinopril 2.5 mg at home  gentle fluids- N/s 100cc/hr.   RENAL A:   Metabolic alkalosis , compensatory hypomag P:   Replete lytes as needed Bmet daily   GASTROINTESTINAL A: Protein calorie malnutrition P:   NPO PEpcid Ct TFs  HEMATOLOGIC A:   Leukocytosis- likely due to infection, resolved P:  Trend  INFECTIOUS A:   CAP -Urine step positve MRSA + P:   Ceft and Azithro Can dc  Vanc    ENDOCRINE A:   No acute  issues P:   CBGs Q4H  NEUROLOGIC A:   No acute issues P:   RASS goal: 0 Fentanyl gtt -taper down Add precedex gtt Versed PRN Resume home meds - xanax , buspar  FAMILY  - Updates:  Family not present.  - Inter-disciplinary family meet or Palliative Care meeting due by:  4/22.   The patient is critically ill with multiple organ systems failure and requires high complexity decision making for assessment and support, frequent evaluation and titration of therapies, application of advanced monitoring technologies and extensive interpretation of multiple databases. Critical Care Time devoted to patient care services described in this note independent of APP time is 35 minutes.   Kara Mead MD. Shade Flood. [facility] Pager [phone] If no response call [phone]   02/17/2016   02/17/2016, 10:50 AM

## 2016-02-17 NOTE — Progress Notes (Signed)
Rt note- patient transferred to 9m09 without difficulty, currently on full support.

## 2016-02-17 NOTE — Plan of Care (Signed)
Attempted to call report to 2M 

## 2016-02-17 NOTE — Plan of Care (Signed)
Patient transferred to [facility] 09, RN in room to receive patient. Call to pt's daughter but  No answer.

## 2016-02-17 NOTE — Progress Notes (Signed)
Report to [facility] RN

## 2016-02-18 ENCOUNTER — Inpatient Hospital Stay (HOSPITAL_COMMUNITY): Payer: Medicare Other

## 2016-02-18 DIAGNOSIS — J9600 Acute respiratory failure, unspecified whether with hypoxia or hypercapnia: Secondary | ICD-10-CM

## 2016-02-18 LAB — PHOSPHORUS: Phosphorus: 4.1 mg/dL (ref 2.5–4.6)

## 2016-02-18 LAB — GLUCOSE, CAPILLARY
GLUCOSE-CAPILLARY: 139 mg/dL — AB (ref 65–99)
GLUCOSE-CAPILLARY: 111 mg/dL — AB (ref 65–99)
Glucose-Capillary: 109 mg/dL — ABNORMAL HIGH (ref 65–99)
Glucose-Capillary: 148 mg/dL — ABNORMAL HIGH (ref 65–99)
Glucose-Capillary: 154 mg/dL — ABNORMAL HIGH (ref 65–99)

## 2016-02-18 LAB — BASIC METABOLIC PANEL
Anion gap: 9 (ref 5–15)
BUN: 22 mg/dL — AB (ref 6–20)
CALCIUM: 8.4 mg/dL — AB (ref 8.9–10.3)
CO2: 34 mmol/L — AB (ref 22–32)
CREATININE: 0.62 mg/dL (ref 0.44–1.00)
Chloride: 97 mmol/L — ABNORMAL LOW (ref 101–111)
GFR calc Af Amer: >60 mL/min (ref >60)
Glucose, Bld: 151 mg/dL — ABNORMAL HIGH (ref 65–99)
Potassium: 4.3 mmol/L (ref 3.5–5.1)
Sodium: 140 mmol/L (ref 135–145)

## 2016-02-18 LAB — POCT I-STAT 3, ART BLOOD GAS (G3+)
Acid-Base Excess: 10 mmol/L — ABNORMAL HIGH (ref 0.0–2.0)
BICARBONATE: 38.1 meq/L — AB (ref 20.0–24.0)
O2 Saturation: 97 %
PCO2 ART: 72.4 mmHg — AB (ref 35.0–45.0)
PO2 ART: 98 mmHg (ref 80.0–100.0)
TCO2: 40 mmol/L (ref 0–100)
pH, Arterial: 7.329 — ABNORMAL LOW (ref 7.350–7.450)

## 2016-02-18 LAB — CBC
HCT: 35.4 % — ABNORMAL LOW (ref 36.0–46.0)
HEMOGLOBIN: 11.2 g/dL — AB (ref 12.0–15.0)
MCH: 30.8 pg (ref 26.0–34.0)
MCHC: 31.6 g/dL (ref 30.0–36.0)
MCV: 97.3 fL (ref 78.0–100.0)
PLATELETS: 250 10*3/uL (ref 150–400)
RBC: 3.64 MIL/uL — ABNORMAL LOW (ref 3.87–5.11)
RDW: 12 % (ref 11.5–15.5)
WBC: 6.5 10*3/uL (ref 4.0–10.5)

## 2016-02-18 LAB — MAGNESIUM: MAGNESIUM: 2 mg/dL (ref 1.7–2.4)

## 2016-02-18 MED ORDER — METOPROLOL TARTRATE 1 MG/ML IV SOLN
5.0000 mg | INTRAVENOUS | Status: DC | PRN
  Administered 2016-02-18 – 2016-02-21 (×4): 5 mg via INTRAVENOUS
  Filled 2016-02-18 (×4): qty 5

## 2016-02-18 MED ORDER — PRO-STAT SUGAR FREE PO LIQD
30.0000 mL | Freq: Two times a day (BID) | ORAL | Status: DC
  Filled 2016-02-18: qty 30

## 2016-02-18 MED ORDER — METOPROLOL TARTRATE 1 MG/ML IV SOLN: 10.0000 mg | Freq: Four times a day (QID) | INTRAVENOUS | Status: DC | PRN

## 2016-02-18 MED ORDER — METHYLPREDNISOLONE SODIUM SUCC 40 MG IJ SOLR
40.0000 mg | Freq: Three times a day (TID) | INTRAMUSCULAR | Status: DC
  Administered 2016-02-18 – 2016-02-19 (×3): 40 mg via INTRAVENOUS
  Filled 2016-02-18 (×4): qty 1

## 2016-02-18 MED ORDER — PANTOPRAZOLE SODIUM 40 MG IV SOLR
40.0000 mg | INTRAVENOUS | Status: DC
  Administered 2016-02-18 – 2016-02-19 (×2): 40 mg via INTRAVENOUS
  Filled 2016-02-18 (×2): qty 40

## 2016-02-18 MED ORDER — METOPROLOL TARTRATE 1 MG/ML IV SOLN
5.0000 mg | Freq: Four times a day (QID) | INTRAVENOUS | Status: DC | PRN
  Administered 2016-02-18 (×2): 5 mg via INTRAVENOUS
  Filled 2016-02-18 (×2): qty 5

## 2016-02-18 MED ORDER — ALPRAZOLAM 0.25 MG PO TABS
0.2500 mg | ORAL_TABLET | Freq: Four times a day (QID) | ORAL | Status: DC | PRN
  Administered 2016-02-18 – 2016-02-20 (×7): 0.25 mg via ORAL
  Filled 2016-02-18 (×7): qty 1

## 2016-02-18 MED ORDER — VITAL HIGH PROTEIN PO LIQD: 1000.0000 mL | ORAL | Status: DC

## 2016-02-18 MED ORDER — METOPROLOL TARTRATE 1 MG/ML IV SOLN: 5.0000 mg | Freq: Four times a day (QID) | INTRAVENOUS | Status: DC | PRN

## 2016-02-18 NOTE — Progress Notes (Signed)
[facility] Progress Note Patient Name: Megan [name] DOB: 16-Jan-1951 MRN: [id number]   Date of Service  02/18/2016  HPI/Events of Note  Anxiety.  eICU Interventions  Increase Xanax 0.25 mg PO Q 6 hours PRN anxiety.      Intervention Category Minor Interventions: Agitation / anxiety - evaluation and management  Lysle Dingwall 02/18/2016, 5:52 PM

## 2016-02-18 NOTE — Procedures (Signed)
Extubation Procedure Note  Patient Details:   Name: Megan [name] DOB: [id number] MRN: [id number]   Airway Documentation: Pt had audible cuff leak prior to extubation, alert and following commands.  Extubated to 4 lpm Stamps pt able to say full name and where she was.  Changed to 40% VM and sat came up to 94%.  Good, strong cough and good effort with IS of 500-750 cc.    Evaluation  O2 sats: stable throughout Complications: No apparent complications Patient did tolerate procedure well. Bilateral Breath Sounds: Clear, Diminished   Yes  Ned Grace 02/18/2016, 11:29 AM

## 2016-02-18 NOTE — Progress Notes (Addendum)
PULMONARY / CRITICAL CARE MEDICINE   Name: Megan [name] MRN: [id number] DOB: 1951-09-20    ADMISSION DATE:  02/12/2016   CONSULTATION DATE:  02/15/16  REFERRING MD:  Dr. Kalman Jewels.  CHIEF COMPLAINT:  Sudden Increased respiratory distress  HISTORY OF PRESENT ILLNESS:   34 Y O admitted 4/13 with SOB, worsening cough- productive of spuutum, fevers- temp 102 here. Been managed for COPD exacerbation and PNA, started on CAP coverage with azithro and Ceftriaxone, Vanc was added for MRSA coverage with MRSA hx and concern for severe lung dx. On admission- PH- 7.29, with Pco2- 67.1, mild lactic acidosis that resolved, Flu PCR negative, leukocytosis- 20.6 that's down trending, Chest xray on admission not suggestive of PNA, and negative repeat Xray after hydration. Pt had intermitent BIPAP since admission, and then transferred to Tele yesterday. Pt today while getting Nebulizer treatment suddenly became SOB, with increased WOB and decreased LOC, with improvement after starting BIPAP. ABG drawn- Ph- 7.14, PCo2- Undetectable >100.  PCCM was asked to see.   SUBJECTIVE:  Off neo gtt.  She is still on fentanyl gtt and precedex gtt.  She arouses and has agitation/anxiety.  Remains afebrile.    VITAL SIGNS: BP 159/83 mmHg  Pulse 83  Temp(Src) 97.7 F (36.5 C) (Oral)  Resp 14  Ht '5\' 3"'$  (1.6 m)  Wt 238 lb 5.1 oz (108.1 kg)  BMI 42.23 kg/m2  SpO2 100%  HEMODYNAMICS: CVP:  [1 mmHg-8 mmHg] 8 mmHg  VENTILATOR SETTINGS: Vent Mode:  [-] PRVC FiO2 (%):  [40 %] 40 % Set Rate:  [14 bmp] 14 bmp Vt Set:  [400 mL] 400 mL PEEP:  [5 cmH20] 5 cmH20 Plateau Pressure:  [15 cmH20-22 cmH20] 19 cmH20  INTAKE / OUTPUT: I/O last 3 completed shifts: In: 1870.6 [I.V.:950.6; Other:105; NG/GT:165; IV Piggyback:650] Out: 2300 [Urine:2300]  PHYSICAL EXAMINATION: General:   Frail appearing, caucasian female, appears older than stated age, agitated Neuro: sedated, moving all extremities to command HEENT:  ETT tube,  central line, [place]/AT Cardiovascular:  RRR, no m/r/g Lungs: diminished breath sounds bilaterally, even , no wheezes or crackles, appreciated. Abdomen:  Soft, non-tender, non-distended. Musculoskeletal:  No edema Skin:  No rash, warm.  LABS:  BMET  Recent Labs Lab 02/16/16 0400 02/17/16 0405 02/18/16 0505  NA 140 141 140  K 4.0 4.3 4.3  CL 98* 98* 97*  CO2 32 33* 34*  BUN 16 15 22*  CREATININE 0.70 0.65 0.62  GLUCOSE 114* 130* 151*    Electrolytes  Recent Labs Lab 02/15/16 1439 02/16/16 0400 02/17/16 0405 02/18/16 0505  CALCIUM 8.5* 7.7* 8.2* 8.4*  MG 1.6* 1.5*  --  2.0  PHOS 3.8 2.2*  --  4.1    CBC  Recent Labs Lab 02/16/16 0400 02/17/16 0405 02/18/16 0505  WBC 6.6 6.8 6.5  HGB 10.1* 10.6* 11.2*  HCT 32.1* 33.2* 35.4*  PLT 263 241 250    Coag's No results for input(s): APTT, INR in the last 168 hours.  Sepsis Markers  Recent Labs Lab 02/12/16 2007 02/12/16 2212 02/13/16 0025 02/14/16 0636  LATICACIDVEN 3.45* 1.9 1.3  --   PROCALCITON  --  1.99  --  2.02    ABG  Recent Labs Lab 02/15/16 1400 02/15/16 1739 02/16/16 0458  PHART 7.144* 7.260* 7.413  PCO2ART CRITICAL RESULT CALLED TO, READ BACK BY AND VERIFIED WITH: 82.4* 57.9*  PO2ART 215* 219.0* 117.0*    Liver Enzymes  Recent Labs Lab 02/12/16 1645  AST 28  ALT 16  ALKPHOS 69  BILITOT 0.8  ALBUMIN 4.0    Cardiac Enzymes  Recent Labs Lab 02/16/16 0225 02/17/16 0405 02/17/16 0940  TROPONINI 0.04* <0.03 <0.03    Glucose  Recent Labs Lab 02/16/16 1615 02/16/16 2351 02/17/16 1602 02/17/16 1939 02/18/16 0021 02/18/16 0403  GLUCAP 130* 123* 137* 149* 154* 139*    Imaging Dg Chest Port 1 View  02/18/2016  CLINICAL DATA:  Hypoxia EXAM: PORTABLE CHEST 1 VIEW COMPARISON:  February 17, 2016 FINDINGS: Endotracheal tube tip is 7.3 cm above the carina. Central catheter tip is in the superior vena cava. Nasogastric tube tip and side port are in the stomach. No pneumothorax.  Lungs are hyperexpanded. There is a persistent small left pleural effusion with bibasilar atelectasis. No new opacity. No change in cardiac silhouette. IMPRESSION: Tube and catheter positions as described without pneumothorax. Small left pleural effusion with bibasilar atelectasis. No new opacity. No change in cardiac silhouette. Electronically Signed   By: Lowella Grip III M.D.   On: 02/18/2016 07:16    STUDIES:  Echo 08/2015 nml LVEF, RVSP 42  CULTURES: 02/12/16- Blood Cultures X2 >> NGTD 02/12/16- urine culture multiple species, suggest recollection. MRSA pcr 4/13 + 4/13 Resp Virus Panel >> neg 4/13 Influenza panel negative 4/13 Legionella >> negative 4/13 Strep Pneumo >> POSITIVE  ANTIBIOTICS: Vanc 4/13 >> 4/16 ceftriazone 4/14 >> Stop 4/21 Azithro 4/14 >> 4/16  SIGNIFICANT EVENTS: Admitted to hospital- Step down- 4/13  Transferred to ICU- 4/17  LINES/TUBES: Intubated- 4/16 CVL Left IJ- 4/16 >> ART Line 4/16 >> PIV x 1 4/16 >> Foley 4/16 >> NG/OG 4/16 >>  DISCUSSION: 65 Y O F with PMH of severe COPD, been managed for COPD exacerbation and PNA.   ASSESSMENT / PLAN:  PULMONARY A: Acute hypercapneic respiratory failure Severe COPD Multiple pulmonary Lung Nodule P:   VAP bundle Daily SBTs -need better control of anxiety Pulmonary hygiene Budesonide Continue IV solumedrol '40mg'$  Q6H, reduce  CARDIOVASCULAR A:  Tachycardia - seems to correlate with arousal and her agitation Hypertension Hypotension post intubation - resolved Troponin negative P:  Hold home bp meds, Lisinopril 2.5 mg at home Add PRN Hydralazine SBP >140  RENAL A:   Metabolic alkalosis , compensatory Hypomag P:   Replete electrolytes as needed BMET daily Even balance goals  GASTROINTESTINAL A: Protein calorie malnutrition P:   NPO PPI Need tube feeds  HEMATOLOGIC A:   Leukocytosis- likely due to infection, resolved DV tprev P:  Trend CBC Lovenox  INFECTIOUS A:    Pneumococcal Pneumonia MRSA + P:   D/C vanc and azithromycin Continue Ceftriaxone >> Stop date added 4/21  ENDOCRINE A:   No acute issues P:   CBGs Q4H Reduce roids  NEUROLOGIC A:   Anxiety Vent dyschrony P:   RASS goal: 0 Fentanyl gtt - attempt to taper down Precedex gtt Versed PRN Resume home meds - xanax , buspar, lexapro  FAMILY  - Updates:  Family not present.  - Inter-disciplinary family meet or Palliative Care meeting due by:  4/22.   Jule Ser, DO 02/18/2016, 8:00 AM PGY-1, [facility]   STAFF NOTE: I, Merrie Roof, MD FACP have personally reviewed patient's available data, including medical history, events of note, physical examination and test results as part of my evaluation. I have discussed with resident/NP and other care providers such as pharmacist, RN and RRT. In addition, I personally evaluated patient and elicited key findings of: awakans, FC slight, agitation, lungs moving air better, pcxr improved  aeration bases, cpap 5 ps5, goal 1 hr, upright, WUA, precedex to off, likley will respond well to benzo as main therapy, fent prn okay, keep precedex off, abx to stop date, start feeds today, PT consult on vent The patient is critically ill with multiple organ systems failure and requires high complexity decision making for assessment and support, frequent evaluation and titration of therapies, application of advanced monitoring technologies and extensive interpretation of multiple databases.   Critical Care Time devoted to patient care services described in this note is 30 Minutes. This time reflects time of care of this signee: Merrie Roof, MD FACP. This critical care time does not reflect procedure time, or teaching time or supervisory time of PA/NP/Med student/Med Resident etc but could involve care discussion time. Rest per NP/medical resident whose note is outlined above and that I agree with   Lavon Paganini. Titus Mould, MD,  [place] Pgr: [facility] 02/18/2016 10:38 AM    On wean now appears well, excellent mechanics, awake, will hold TF, obtain abg on wean, want to dc a line asap. May be an escellent candidate eto NIMV  Lavon Paganini. Titus Mould, MD, [place] Pgr: [facility]

## 2016-02-18 NOTE — Progress Notes (Signed)
Approximately 200 mls fentanyl wasted and witnessed by [employer]

## 2016-02-18 NOTE — Progress Notes (Signed)
Nutrition Follow-up / Consult  DOCUMENTATION CODES:   Severe malnutrition in context of chronic illness  INTERVENTION:    Diet advancement per physician as able, resume PO supplements once diet advanced.  NUTRITION DIAGNOSIS:   Malnutrition related to chronic illness as evidenced by severe depletion of body fat, severe depletion of muscle mass.  Ongoing  GOAL:   Patient will meet greater than or equal to 90% of their needs  Unmet  MONITOR:   Diet advancement, PO intake, Labs, Weight trends, I & O's  REASON FOR ASSESSMENT:   Malnutrition Screening Tool    ASSESSMENT:   Megan [name] is a 65 y.o. female with a past medical history significant for COPD stage 4, FEV1 21% on home O2, HTN, and PTSD who presents with shortness of breath and fever.   Patient was transferred to the ICU and intubated on 4/18 due to increased respiratory distress. She was extubated this morning. Diet currently NPO. Previously on a regular diet with thin liquids and Ensure Enlive TID, eating very poorly. Patient with severe PCM.   Received MD Consult for TF initiation and management. Now that patient has been extubated, she has no access for TF, hopefully can advance diet. Discussed plan with CCM resident physicians. No plans to start TF, will d/c orders.  Diet Order:  Diet NPO time specified  Skin:  Reviewed, no issues  Last BM:  4/13  Height:   Ht Readings from Last 1 Encounters:  02/12/16 '5\' 3"'$  (1.6 m)    Weight:   Wt Readings from Last 1 Encounters:  02/17/16 238 lb 5.1 oz (108.1 kg)    Ideal Body Weight:  52.3 kg  BMI:  Body mass index is 42.23 kg/(m^2).  Estimated Nutritional Needs:   Kcal:  1550-1750 kcals (30-33 kcals/kg IBW)  Protein:  60-70 g (1.2-1.3 g/kg IBW)  Fluid:  1.5-1.7 L  EDUCATION NEEDS:   No education needs identified at this time  Molli Barrows, [place], [place], [place] Pager [phone] After Hours Pager [phone]

## 2016-02-19 LAB — BASIC METABOLIC PANEL
ANION GAP: 8 (ref 5–15)
BUN: 20 mg/dL (ref 6–20)
CHLORIDE: 94 mmol/L — AB (ref 101–111)
CO2: 37 mmol/L — AB (ref 22–32)
Calcium: 8.4 mg/dL — ABNORMAL LOW (ref 8.9–10.3)
Creatinine, Ser: 0.6 mg/dL (ref 0.44–1.00)
GFR calc Af Amer: >60 mL/min (ref >60)
GLUCOSE: 107 mg/dL — AB (ref 65–99)
POTASSIUM: 4.4 mmol/L (ref 3.5–5.1)
Sodium: 139 mmol/L (ref 135–145)

## 2016-02-19 LAB — CBC
HEMATOCRIT: 36 % (ref 36.0–46.0)
HEMOGLOBIN: 11.3 g/dL — AB (ref 12.0–15.0)
MCH: 30.8 pg (ref 26.0–34.0)
MCHC: 31.4 g/dL (ref 30.0–36.0)
MCV: 98.1 fL (ref 78.0–100.0)
Platelets: 335 10*3/uL (ref 150–400)
RBC: 3.67 MIL/uL — ABNORMAL LOW (ref 3.87–5.11)
RDW: 12.1 % (ref 11.5–15.5)
WBC: 10.1 10*3/uL (ref 4.0–10.5)

## 2016-02-19 LAB — PHOSPHORUS: Phosphorus: 4.2 mg/dL (ref 2.5–4.6)

## 2016-02-19 LAB — MAGNESIUM: MAGNESIUM: 1.9 mg/dL (ref 1.7–2.4)

## 2016-02-19 MED ORDER — PREDNISONE 20 MG PO TABS
40.0000 mg | ORAL_TABLET | Freq: Every day | ORAL | Status: DC
  Administered 2016-02-20 – 2016-02-21 (×2): 40 mg via ORAL
  Filled 2016-02-19 (×2): qty 2

## 2016-02-19 MED ORDER — METOPROLOL TARTRATE 25 MG PO TABS
25.0000 mg | ORAL_TABLET | Freq: Two times a day (BID) | ORAL | Status: DC
  Administered 2016-02-19 – 2016-02-21 (×5): 25 mg via ORAL
  Filled 2016-02-19 (×5): qty 1

## 2016-02-19 MED ORDER — PNEUMOCOCCAL 13-VAL CONJ VACC IM SUSP
0.5000 mL | INTRAMUSCULAR | Status: DC
  Filled 2016-02-19: qty 0.5

## 2016-02-19 MED ORDER — LISINOPRIL 5 MG PO TABS
2.5000 mg | ORAL_TABLET | Freq: Every day | ORAL | Status: DC
  Administered 2016-02-19 – 2016-02-21 (×3): 2.5 mg via ORAL
  Filled 2016-02-19 (×3): qty 1

## 2016-02-19 MED ORDER — ZOLPIDEM TARTRATE 5 MG PO TABS
5.0000 mg | ORAL_TABLET | Freq: Every evening | ORAL | Status: DC | PRN
  Administered 2016-02-19 – 2016-02-20 (×2): 5 mg via ORAL
  Filled 2016-02-19 (×2): qty 1

## 2016-02-19 NOTE — Progress Notes (Signed)
PULMONARY / CRITICAL CARE MEDICINE   Name: Megan [name] MRN: [id number] DOB: 14-Jun-1951    ADMISSION DATE:  02/12/2016   CONSULTATION DATE:  02/15/16  REFERRING MD:  Dr. Kalman Jewels.  CHIEF COMPLAINT:  Sudden Increased respiratory distress  HISTORY OF PRESENT ILLNESS:   65 Y O admitted 4/13 with SOB, worsening cough- productive of spuutum, fevers- temp 102 here. Been managed for COPD exacerbation and PNA, started on CAP coverage with azithro and Ceftriaxone, Vanc was added for MRSA coverage with MRSA hx and concern for severe lung dx. On admission- PH- 7.29, with Pco2- 67.1, mild lactic acidosis that resolved, Flu PCR negative, leukocytosis- 20.6 that's down trending, Chest xray on admission not suggestive of PNA, and negative repeat Xray after hydration. Pt had intermitent BIPAP since admission, and then transferred to Tele yesterday. Pt today while getting Nebulizer treatment suddenly became SOB, with increased WOB and decreased LOC, with improvement after starting BIPAP. ABG drawn- Ph- 7.14, PCo2- Undetectable >100.  PCCM was asked to see.   SUBJECTIVE:  Awake, no distress Extubated day prior  VITAL SIGNS: BP 164/87 mmHg  Pulse 99  Temp(Src) 99 F (37.2 C) (Oral)  Resp 18  Ht '5\' 3"'$  (1.6 m)  Wt 113 lb 3.2 oz (51.347 kg)  BMI 20.06 kg/m2  SpO2 94%  HEMODYNAMICS: CVP:  [1 mmHg-89 mmHg] 1 mmHg  VENTILATOR SETTINGS: Vent Mode:  [-] PSV;CPAP FiO2 (%):  [40 %] 40 % PEEP:  [5 cmH20] 5 cmH20 Pressure Support:  [5 cmH20] 5 cmH20  INTAKE / OUTPUT: I/O last 3 completed shifts: In: 780.3 [P.O.:120; I.V.:610.3; IV Piggyback:50] Out: 2655 [Urine:2655]  PHYSICAL EXAMINATION: General:   Frail appearing Neuro:a o x 4 HEENT:  jvd wnl, ett gone Cardiovascular:  RRR, no m/r/g Lungs: diminished, no wheezing Abdomen:  Soft, non-tender, non-distended. Musculoskeletal:  No edema Skin:  No rash, warm.  LABS:  BMET  Recent Labs Lab 02/17/16 0405 02/18/16 0505 02/19/16 0340   NA 141 140 139  K 4.3 4.3 4.4  CL 98* 97* 94*  CO2 33* 34* 37*  BUN 15 22* 20  CREATININE 0.65 0.62 0.60  GLUCOSE 130* 151* 107*    Electrolytes  Recent Labs Lab 02/16/16 0400 02/17/16 0405 02/18/16 0505 02/19/16 0340  CALCIUM 7.7* 8.2* 8.4* 8.4*  MG 1.5*  --  2.0 1.9  PHOS 2.2*  --  4.1 4.2    CBC  Recent Labs Lab 02/17/16 0405 02/18/16 0505 02/19/16 0340  WBC 6.8 6.5 10.1  HGB 10.6* 11.2* 11.3*  HCT 33.2* 35.4* 36.0  PLT 241 250 335    Coag's No results for input(s): APTT, INR in the last 168 hours.  Sepsis Markers  Recent Labs Lab 02/12/16 2007 02/12/16 2212 02/13/16 0025 02/14/16 0636  LATICACIDVEN 3.45* 1.9 1.3  --   PROCALCITON  --  1.99  --  2.02    ABG  Recent Labs Lab 02/15/16 1739 02/16/16 0458 02/18/16 1101  PHART 7.260* 7.413 7.329*  PCO2ART 82.4* 57.9* 72.4*  PO2ART 219.0* 117.0* 98.0    Liver Enzymes  Recent Labs Lab 02/12/16 1645  AST 28  ALT 16  ALKPHOS 69  BILITOT 0.8  ALBUMIN 4.0    Cardiac Enzymes  Recent Labs Lab 02/16/16 0225 02/17/16 0405 02/17/16 0940  TROPONINI 0.04* <0.03 <0.03    Glucose  Recent Labs Lab 02/17/16 1939 02/18/16 0021 02/18/16 0403 02/18/16 0811 02/18/16 1138 02/18/16 1530  GLUCAP 149* 154* 139* 148* 109* 111*    Imaging No results found.  STUDIES:  Echo 08/2015 nml LVEF, RVSP 42  CULTURES: 02/12/16- Blood Cultures X2 >> NGTD 02/12/16- urine culture multiple species, suggest recollection. MRSA pcr 4/13 + 4/13 Resp Virus Panel >> neg 4/13 Influenza panel negative 4/13 Legionella >> negative 4/13 Strep Pneumo >> POSITIVE  ANTIBIOTICS: Vanc 4/13 >> 4/16 ceftriazone 4/14 >> Stop 4/21 Azithro 4/14 >> 4/16  SIGNIFICANT EVENTS: Admitted to hospital- Step down- 4/13  Transferred to ICU- 4/17 4/19 extubated  LINES/TUBES: Intubated- 4/16>>4/19 CVL Left IJ- 4/16 >>4/20 ART Line 4/16 >>4/20 PIV x 1 4/16 >> Foley 4/16 >> NG/OG 4/16 >>  DISCUSSION: 52 Y O F with  PMH of severe COPD, been managed for COPD exacerbation and PNA.   ASSESSMENT / PLAN:  PULMONARY A: Acute hypercapneic respiratory failure Severe COPD Multiple pulmonary Lung Nodule P:   Pulmonary hygiene Budesonide Continue IV solumedrol '40mg'$  Q6H, reduce to pred oral  Will need outpt pulm follow up  CARDIOVASCULAR A:  HTN P:  Lisinopril 2.5 mg, increase Add PRN Hydralazine SBP >140 Add oral metoprolol likely will need oral hydral  RENAL A:   Metabolic alkalosis , compensatory Hypomag P:   Allow neg balance  GASTROINTESTINAL A: Protein calorie malnutrition P:   Dc ppi if not home med slp Diet added  HEMATOLOGIC A:   Leukocytosis- likely due to infection, resolved DV tprev P:  Lovenox until ambulation  INFECTIOUS A:   Pneumococcal Pneumonia MRSA + P:   D/C vanc and azithromycin Continue Ceftriaxone >> Stop date added 4/21  ENDOCRINE A:   No acute issues P:   CBGs Q4H Reduce roids to pred  NEUROLOGIC A:   Anxiety Vent dyschrony insomnia P:   May need addition Ambien melatonin Versed PRN Resume home meds - xanax , buspar, lexapro  FAMILY  - Updates:  Family not present.  - Inter-disciplinary family meet or Palliative Care meeting due by:  4/22.   STAFF NOTE: I, Merrie Roof, MD FACP have personally reviewed patient's available data, including medical history, events of note, physical examination and test results as part of my evaluation. I have discussed with resident/NP and other care providers such as pharmacist, RN and RRT. In addition, I personally evaluated patient and elicited key findings of: awake, extubated day prior, no distress, insomnia, add ambien, HTn is still an issue, add metop oral, may need addition hydral if HR drops further, neg bvalance on own okay, AABX to stop date, to traid, floor   [employer]. Titus Mould, MD, [place] Pgr: [facility]

## 2016-02-19 NOTE — Progress Notes (Signed)
Attempted to call report to RN. Awaiting call back.

## 2016-02-19 NOTE — Progress Notes (Signed)
PT Cancellation Note  Patient Details Name: Megan [name] MRN: [id number] DOB: [birth year]   Cancelled Treatment:    Reason Eval/Treat Not Completed: Other (comment) pt just received her breakfast and was requesting breathing treatment.  Will hold PT at this time and made RN aware of request for breathing treatment.  Will f/u another time.     Catarina Hartshorn, [place] 02/19/2016, 11:49 AM

## 2016-02-19 NOTE — Evaluation (Signed)
Clinical/Bedside Swallow Evaluation Patient Details  Name: Megan [name] MRN: [id number] Date of Birth: 26-Jul-1951  Today's Date: 02/19/2016 Time: SLP Start Time (ACUTE ONLY): 3419 SLP Stop Time (ACUTE ONLY): 0941 SLP Time Calculation (min) (ACUTE ONLY): 12 min  Past Medical History:  Past Medical History  Diagnosis Date  . Myocardial infarction ([place])   . CHF (congestive heart failure) ([place])   . COPD (chronic obstructive pulmonary disease) ([place])   . Shortness of breath dyspnea   . Depression   . GERD (gastroesophageal reflux disease)   . History of hiatal hernia   . Headache   . Anemia   . Essential hypertension 08/19/2015  . Anxiety state 08/20/2015  . CAP (community acquired pneumonia)    Past Surgical History:  Past Surgical History  Procedure Laterality Date  . Cardiac catheterization    . Cardiac catheterization N/A 08/26/2015    Procedure: Left Heart Cath and Coronary Angiography;  Surgeon: Jettie Booze, MD;  Location: [facility];  Service: Cardiovascular;  Laterality: N/A;   HPI:  65 Y O F with PMH of GERD, MI, CHF, and severe COPD, been managed for COPD exacerbation and PNA. She was intubated 4/16-4/19.   Assessment / Plan / Recommendation Clinical Impression  Pt had one delayed cough, although with seemingly swift swallow trigger and clear vocal quality throughout trials. Mild oral residue noted with regular textures which required Min cues from SLP to clear. Pt reports feeling both tired and full after small amounts of intake. Will start Dys 3 diet and thin liquids for energy conservation with use of aspiration and reflux precautions.    Aspiration Risk  Mild aspiration risk    Diet Recommendation Dysphagia 3 (Mech soft);Thin liquid   Liquid Administration via: Cup;Straw Medication Administration: Whole meds with puree Supervision: Patient able to self feed;Intermittent supervision to cue for compensatory strategies Compensations: Slow rate;Small  sips/bites;Follow solids with liquid Postural Changes: Seated upright at 90 degrees;Remain upright for at least 30 minutes after po intake    Other  Recommendations Oral Care Recommendations: Oral care BID   Follow up Recommendations   (tba)    Frequency and Duration min 2x/week  2 weeks       Prognosis Prognosis for Safe Diet Advancement: Good      Swallow Study   General HPI: 57 Y O F with PMH of GERD, MI, CHF, and severe COPD, been managed for COPD exacerbation and PNA. She was intubated 4/16-4/19. Type of Study: Bedside Swallow Evaluation Previous Swallow Assessment: none in chart Diet Prior to this Study: Dysphagia 3 (soft);Thin liquids Temperature Spikes Noted: No Respiratory Status: Nasal cannula History of Recent Intubation: Yes Length of Intubations (days): 4 days Date extubated: 02/18/16 Behavior/Cognition: Alert;Cooperative;Pleasant mood;Requires cueing Oral Cavity Assessment: Within Functional Limits Oral Care Completed by SLP: No Oral Cavity - Dentition: Dentures, top;Dentures, bottom Vision: Functional for self-feeding Self-Feeding Abilities: Able to feed self Patient Positioning: Upright in bed Baseline Vocal Quality: Normal Volitional Cough: Strong;Congested Volitional Swallow: Able to elicit    Oral/Motor/Sensory Function Overall Oral Motor/Sensory Function: Within functional limits   Ice Chips Ice chips: Not tested   Thin Liquid Thin Liquid: Within functional limits Presentation: Cup;Self Fed;Straw    Nectar Thick Nectar Thick Liquid: Not tested   Honey Thick Honey Thick Liquid: Not tested   Puree Puree: Within functional limits Presentation: Self Fed;Spoon   Solid   GO   Solid: Impaired Presentation: Self Fed Oral Phase Functional Implications: Impaired mastication;Oral residue Pharyngeal Phase Impairments:  Cough - Delayed       Germain Osgood, M.A. CCC-SLP [phone]  Germain Osgood 02/19/2016,10:05 AM

## 2016-02-19 NOTE — Progress Notes (Signed)
Patient requested to have belongings brought to her from security. Nurse secretary picked up envelope and delivered to RN. RN gave the envelope to the patient. Patient is awake, alert, and oriented. She has opened her belongings in the bed and has them in her possession.

## 2016-02-20 DIAGNOSIS — E43 Unspecified severe protein-calorie malnutrition: Secondary | ICD-10-CM

## 2016-02-20 DIAGNOSIS — I10 Essential (primary) hypertension: Secondary | ICD-10-CM

## 2016-02-20 LAB — BASIC METABOLIC PANEL
ANION GAP: 11 (ref 5–15)
BUN: 17 mg/dL (ref 6–20)
CHLORIDE: 88 mmol/L — AB (ref 101–111)
CO2: 38 mmol/L — ABNORMAL HIGH (ref 22–32)
Calcium: 8.2 mg/dL — ABNORMAL LOW (ref 8.9–10.3)
Creatinine, Ser: 0.62 mg/dL (ref 0.44–1.00)
GFR calc Af Amer: >60 mL/min (ref >60)
Glucose, Bld: 64 mg/dL — ABNORMAL LOW (ref 65–99)
POTASSIUM: 4.2 mmol/L (ref 3.5–5.1)
Sodium: 137 mmol/L (ref 135–145)

## 2016-02-20 LAB — CBC
HEMATOCRIT: 39 % (ref 36.0–46.0)
HEMOGLOBIN: 13 g/dL (ref 12.0–15.0)
MCH: 32.4 pg (ref 26.0–34.0)
MCHC: 33.3 g/dL (ref 30.0–36.0)
MCV: 97.3 fL (ref 78.0–100.0)
Platelets: 406 10*3/uL — ABNORMAL HIGH (ref 150–400)
RBC: 4.01 MIL/uL (ref 3.87–5.11)
RDW: 12 % (ref 11.5–15.5)
WBC: 11.7 10*3/uL — ABNORMAL HIGH (ref 4.0–10.5)

## 2016-02-20 MED ORDER — IPRATROPIUM-ALBUTEROL 0.5-2.5 (3) MG/3ML IN SOLN: 3.0000 mL | Freq: Two times a day (BID) | RESPIRATORY_TRACT | Status: DC

## 2016-02-20 MED ORDER — POLYETHYLENE GLYCOL 3350 17 G PO PACK
17.0000 g | PACK | Freq: Every day | ORAL | Status: DC
  Administered 2016-02-20 – 2016-02-21 (×2): 17 g via ORAL
  Filled 2016-02-20 (×2): qty 1

## 2016-02-20 MED ORDER — SENNOSIDES-DOCUSATE SODIUM 8.6-50 MG PO TABS
2.0000 | ORAL_TABLET | Freq: Two times a day (BID) | ORAL | Status: DC
  Administered 2016-02-20 – 2016-02-21 (×3): 2 via ORAL
  Filled 2016-02-20 (×3): qty 2

## 2016-02-20 NOTE — Evaluation (Signed)
Physical Therapy Evaluation Patient Details Name: Derek Huneycutt MRN: [id number] DOB: Dec 21, 1950 Today's Date: 02/20/2016   History of Present Illness  65 Y O F with PMH of MI, CHF, COPD, depression, hypertension, anxiety and severe COPD, been managed for COPD exacerbation and PNA. She was intubated 4/16-4/19.   Clinical Impression  Pt admitted with above diagnosis. Pt currently with functional limitations due to the deficits listed below (see PT Problem List).  Pt will benefit from skilled PT to increase their independence and safety with mobility to allow for D/C home with her daughter. Pt activity tolerance is very limited during session and encouragement needed to participate was needed. Will progress as pt tolerates.      Follow Up Recommendations Home health PT;Supervision for mobility/OOB    Equipment Recommendations  None recommended by PT    Recommendations for Other Services       Precautions / Restrictions Precautions Precautions: Fall Precaution Comments: watch O2 Restrictions Weight Bearing Restrictions: No      Mobility  Bed Mobility Overal bed mobility: Needs Assistance Bed Mobility: Supine to Sit;Sit to Supine     Supine to sit: Supervision;HOB elevated Sit to supine: Supervision;HOB elevated   General bed mobility comments: Pt using rails to assist with getting in/out of bed.   Transfers Overall transfer level: Needs assistance Equipment used: None Transfers: Sit to/from Stand Sit to Stand: Min guard         General transfer comment: Mild instability with initial standing  Ambulation/Gait Ambulation/Gait assistance: Min assist Ambulation Distance (Feet): 12 Feet Assistive device: 1 person hand held assist Gait Pattern/deviations: Step-through pattern;Decreased step length - right;Decreased step length - left Gait velocity: very slow   General Gait Details: Pt reports being limited by fatigue. SpO2 94% before ambulation and 92% following. Pt on 2L  P2 throughout.   Stairs            Wheelchair Mobility    Modified Rankin (Stroke Patients Only)       Balance Overall balance assessment: Needs assistance Sitting-balance support: No upper extremity supported Sitting balance-Leahy Scale: Good     Standing balance support: No upper extremity supported Standing balance-Leahy Scale: Fair Standing balance comment: mild instability with static standing                             Pertinent Vitals/Pain Pain Assessment: No/denies pain Pain Intervention(s): Monitored during session    Home Living Family/patient expects to be discharged to:: Private residence Living Arrangements: Alone Available Help at Discharge: Family;Available 24 hours/day Type of Home: House Home Access: Level entry     Home Layout: One level Home Equipment: Cane - single point;Walker - 4 wheels Additional Comments: Pt reports that she is planning to stay with her daughter when she is realeased from the hospital.     Prior Function Level of Independence: Independent               Hand Dominance        Extremity/Trunk Assessment               Lower Extremity Assessment: Generalized weakness         Communication   Communication: No difficulties  Cognition Arousal/Alertness: Awake/alert Behavior During Therapy: WFL for tasks assessed/performed Overall Cognitive Status: Within Functional Limits for tasks assessed                      General Comments  Exercises        Assessment/Plan    PT Assessment Patient needs continued PT services  PT Diagnosis Difficulty walking;Generalized weakness   PT Problem List Decreased strength;Decreased activity tolerance;Decreased balance;Decreased mobility  PT Treatment Interventions     PT Goals (Current goals can be found in the Care Plan section) Acute Rehab PT Goals Patient Stated Goal: get her strength back. PT Goal Formulation: With patient Time For  Goal Achievement: 03/05/16 Potential to Achieve Goals: Good    Frequency Min 3X/week   Barriers to discharge        Co-evaluation               End of Session Equipment Utilized During Treatment: Gait belt;Oxygen Activity Tolerance: Patient limited by fatigue Patient left: in bed;with call bell/phone within reach (pt declines sitting up in chair) Nurse Communication: Mobility status         Time: 1359-1420 PT Time Calculation (min) (ACUTE ONLY): 21 min   Charges:   PT Evaluation $PT Eval Moderate Complexity: 1 Procedure     PT G Codes:        Cassell Clement, PT, CSCS Pager [phone] Office [phone]  02/20/2016, 2:30 PM

## 2016-02-20 NOTE — Progress Notes (Signed)
PT Cancellation Note  Patient Details Name: Megan [name] MRN: [id number] DOB: [birth year]   Cancelled Treatment:    Reason Eval/Treat Not Completed: Patient declined, states that she wants to sleep and is not getting up right now. Will follow as able.    Cassell Clement, PT, CSCS Pager [phone] Office [phone]  02/20/2016, 10:20 AM

## 2016-02-20 NOTE — Progress Notes (Signed)
Speech Language Pathology Treatment: Dysphagia  Patient Details Name: Megan [name] MRN: [id number] DOB: 02/16/1951 Today's Date: 02/20/2016 Time: 7062-3762 SLP Time Calculation (min) (ACUTE ONLY): 15 min  Assessment / Plan / Recommendation Clinical Impression  Pt seen for diet tolerance/ regular solid trials and education. Pt had meds with liquid without s/s of aspiration. Pt did have a delayed cough x1. RN and pt report no overt swallowing difficulties during meals; only concerns with decreased intake. Provided education to pt on general swallow precautions and strategies related to COPD (inhale, swallow, exhale pattern). Recommend advancing diet to regular consistency, thin liquids, meds whole with liquid, intermittent supervision to ensure pt is upright during meals, small bites/ sips. SLP will sign off at this time; please re-consult if needs arise.   HPI HPI: 65 Y O F with PMH of GERD, MI, CHF, and severe COPD, been managed for COPD exacerbation and PNA. She was intubated 4/16-4/19.      SLP Plan  All goals met     Recommendations  Diet recommendations: Regular;Thin liquid Liquids provided via: Cup;Straw Medication Administration: Whole meds with liquid Supervision: Patient able to self feed;Intermittent supervision to cue for compensatory strategies Compensations: Slow rate;Small sips/bites;Follow solids with liquid Postural Changes and/or Swallow Maneuvers: Seated upright 90 degrees             Oral Care Recommendations: Oral care BID Follow up Recommendations: None Plan: All goals met     GO                Kern Reap, MA, CCC-SLP 02/20/2016, 11:28 AM [phone]

## 2016-02-20 NOTE — Progress Notes (Signed)
PROGRESS NOTE                                                                                                                                                                                                             Patient Demographics:    Megan [name], is a 65 y.o. female, DOB - Feb 05, 1951, ZOX:[id number]  Admit date - 02/12/2016   Admitting Physician Edwin Dada, MD  Outpatient Primary MD for the patient : Follows with a provider at [place]  Outpatient Specialists: None  Chief Complaint  Patient presents with  . 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 Sepsis pathway was initiated and patient admitted to hospitalist service for lobar pneumonia and COPD exacerbation. Patient presented with acute on chronic hypercapnic respiratory failure and required intermittent BiPAP. On 4/16 patient and increased work of breathing with lethargy. ABG showed pH of 7114 and PCO2 undetectable. PC CM was consulted and patient intubated and transferred to ICU. Central line and A-line placed. Patient continued on steroid and empiric antibiotics. Extubated on 4/19 and transferred to hospitalist service on 4/20.    Subjective:   Patient seen and examined. Denies worsening shortness of breath or cough. Remains afebrile. Complains of constipation.   Assessment  & Plan :    Principal Problem:   Acute on chronic respiratory failure with hypoxia and hypercarbia (HCC) Secondary to COPD exacerbation and lobar pneumonia. Patient required intubation and transfer to ICU. Currently stable on 2 L via nasal cannula. Continue prednisone. Completed antibiotics. Continue Pulmicort and DuoNeb's. Needs outpatient  pulmonary follow-up.  Active Problems:   Essential hypertension Blood pressure uncontrolled. Added beta blocker. Continue lisinopril.    Protein-calorie malnutrition, severe Added supplement  Anxiety state with PTSD On when necessary Xanax. Continue remaining home medications    Esophageal reflux Continue Protonix  Constipation Added bowel regimen       Code Status : Full code  Family Communication  : None at bedside  Disposition Plan  : PT evaluation pending. Possibly home in the next 24-48 hours   Barriers For Discharge : Symptoms persistent  Consults  :  PC CM  Procedures  :  Intubation Central line   DVT Prophylaxis  :  Lovenox -  Lab Results  Component Value Date   PLT 406* 02/20/2016    Antibiotics  : Completed  Anti-infectives    Start     Dose/Rate Route Frequency Ordered Stop   02/14/16 1800  levofloxacin (LEVAQUIN) IVPB 750 mg  Status:  Discontinued     750 mg 100 mL/hr over 90 Minutes Intravenous Every 48 hours 02/12/16 1748 02/12/16 2114   02/13/16 1800  vancomycin (VANCOCIN) IVPB 750 mg/150 ml premix  Status:  Discontinued     750 mg 150 mL/hr over 60 Minutes Intravenous Every 24 hours 02/12/16 1748 02/15/16 1740   02/13/16 1200  cefTRIAXone (ROCEPHIN) 1 g in dextrose 5 % 50 mL IVPB     1 g 100 mL/hr over 30 Minutes Intravenous Every 24 hours 02/12/16 2114 02/19/16 1142   02/13/16 1200  azithromycin (ZITHROMAX) tablet 500 mg  Status:  Discontinued     500 mg Oral Every 24 hours 02/12/16 2114 02/15/16 1740   02/13/16 1000  oseltamivir (TAMIFLU) capsule 75 mg  Status:  Discontinued     75 mg Oral 2 times daily 02/12/16 2114 02/13/16 1703   02/12/16 2000  oseltamivir (TAMIFLU) capsule 75 mg  Status:  Discontinued     75 mg Oral  Once 02/12/16 1928 02/12/16 1959   02/12/16 2000  oseltamivir (TAMIFLU) capsule 30 mg     30 mg Oral  Once 02/12/16 1959 02/12/16 2021   02/12/16 1630  levofloxacin (LEVAQUIN) IVPB 750 mg     750 mg 100 mL/hr over 90  Minutes Intravenous  Once 02/12/16 1616 02/12/16 1828   02/12/16 1630  vancomycin (VANCOCIN) IVPB 1000 mg/200 mL premix  Status:  Discontinued     1,000 mg 200 mL/hr over 60 Minutes Intravenous  Once 02/12/16 1616 02/12/16 1620   02/12/16 1630  vancomycin (VANCOCIN) IVPB 750 mg/150 ml premix     750 mg 150 mL/hr over 60 Minutes Intravenous STAT 02/12/16 1620 02/12/16 1848        Objective:   Filed Vitals:   02/19/16 2040 02/20/16 0556 02/20/16 0934 02/20/16 0935  BP:  159/81    Pulse:  91    Temp:  98.1 F (36.7 C)    TempSrc:  Oral    Resp:  19    Height:      Weight:      SpO2: 99% 96% 96% 96%    Wt Readings from Last 3 Encounters:  02/19/16 51.347 kg (113 lb 3.2 oz)  08/28/15 39.9 kg (87 lb 15.4 oz)  08/21/15 43.5 kg (95 lb 14.4 oz)     Intake/Output Summary (Last 24 hours) at 02/20/16 1410 Last data filed at 02/20/16 0900  Gross per 24 hour  Intake    340 ml  Output    650 ml  Net   -310 ml     Physical Exam  OIZ:TIWPYKD thin built female  not in distress HEENT: no pallor, moist mucosa, supple neck Chest:Diminished bilateral breath sounds, no added sounds  CVS: N S1&S2, no murmurs, rubs or gallop GI: soft, NT, ND, BS+ Musculoskeletal: warm, no edema CNS: AAOX3, non focal    Data Review:    CBC  Recent Labs Lab 02/16/16 0400 02/17/16 0405 02/18/16 0505 02/19/16 0340 02/20/16 0746  WBC 6.6 6.8 6.5 10.1 11.7*  HGB 10.1* 10.6* 11.2* 11.3* 13.0  HCT 32.1* 33.2* 35.4* 36.0 39.0  PLT  263 241 250 335 406*  MCV 99.1 98.5 97.3 98.1 97.3  MCH 31.2 31.5 30.8 30.8 32.4  MCHC 31.5 31.9 31.6 31.4 33.3  RDW 12.2 12.3 12.0 12.1 12.0    Chemistries   Recent Labs Lab 02/15/16 1439 02/16/16 0400 02/17/16 0405 02/18/16 0505 02/19/16 0340 02/20/16 0746  NA 138 140 141 140 139 137  K 4.3 4.0 4.3 4.3 4.4 4.2  CL 91* 98* 98* 97* 94* 88*  CO2 35* 32 33* 34* 37* 38*  GLUCOSE 182* 114* 130* 151* 107* 64*  BUN '11 16 15 '$ 22* 20 17  CREATININE 0.58 0.70  0.65 0.62 0.60 0.62  CALCIUM 8.5* 7.7* 8.2* 8.4* 8.4* 8.2*  MG 1.6* 1.5*  --  2.0 1.9  --    ------------------------------------------------------------------------------------------------------------------ No results for input(s): CHOL, HDL, LDLCALC, TRIG, CHOLHDL, LDLDIRECT in the last 72 hours.  No results found for: HGBA1C ------------------------------------------------------------------------------------------------------------------ No results for input(s): TSH, T4TOTAL, T3FREE, THYROIDAB in the last 72 hours.  Invalid input(s): FREET3 ------------------------------------------------------------------------------------------------------------------ No results for input(s): VITAMINB12, FOLATE, FERRITIN, TIBC, IRON, RETICCTPCT in the last 72 hours.  Coagulation profile No results for input(s): INR, PROTIME in the last 168 hours.  No results for input(s): DDIMER in the last 72 hours.  Cardiac Enzymes  Recent Labs Lab 02/16/16 0225 02/17/16 0405 02/17/16 0940  TROPONINI 0.04* <0.03 <0.03   ------------------------------------------------------------------------------------------------------------------    Component Value Date/Time   BNP 132.4* 02/12/2016 1645    Inpatient Medications  Scheduled Meds: . aspirin  81 mg Oral Daily  . budesonide (PULMICORT) nebulizer solution  0.25 mg Nebulization BID  . busPIRone  15 mg Oral BID  . enoxaparin (LOVENOX) injection  40 mg Subcutaneous QHS  . escitalopram  40 mg Oral Daily  . feeding supplement (ENSURE ENLIVE)  237 mL Oral TID BM  . ipratropium-albuterol  3 mL Nebulization Q6H  . lisinopril  2.5 mg Oral Daily  . metoprolol tartrate  25 mg Oral BID  . pneumococcal 13-valent conjugate vaccine  0.5 mL Intramuscular Tomorrow-1000  . polyethylene glycol  17 g Oral Daily  . predniSONE  40 mg Oral Q breakfast  . senna-docusate  2 tablet Oral BID  . sodium chloride flush  3 mL Intravenous Q12H   Continuous Infusions:  PRN  Meds:.[CANCELED] Place/Maintain arterial line **AND** sodium chloride, acetaminophen **OR** acetaminophen, ALPRAZolam, butalbital-acetaminophen-caffeine, HYDROcodone-acetaminophen, Melatonin, metoprolol, ondansetron **OR** ondansetron (ZOFRAN) IV, zolpidem  Micro Results Recent Results (from the past 240 hour(s))  Blood Culture (routine x 2)     Status: None   Collection Time: 02/12/16  4:32 PM  Result Value Ref Range Status   Specimen Description BLOOD RIGHT ANTECUBITAL  Final   Special Requests IN PEDIATRIC BOTTLE 1CC  Final   Culture NO GROWTH 5 DAYS  Final   Report Status 02/17/2016 FINAL  Final  Blood Culture (routine x 2)     Status: None   Collection Time: 02/12/16  4:44 PM  Result Value Ref Range Status   Specimen Description BLOOD RIGHT WRIST  Final   Special Requests IN PEDIATRIC BOTTLE 2CC  Final   Culture NO GROWTH 5 DAYS  Final   Report Status 02/17/2016 FINAL  Final  Urine culture     Status: None   Collection Time: 02/12/16  8:26 PM  Result Value Ref Range Status   Specimen Description URINE, RANDOM  Final   Special Requests NONE  Final   Culture MULTIPLE SPECIES PRESENT, SUGGEST RECOLLECTION  Final   Report Status 02/14/2016 FINAL  Final  MRSA PCR Screening     Status: Abnormal   Collection Time: 02/12/16  9:15 PM  Result Value Ref Range Status   MRSA by PCR POSITIVE (A) NEGATIVE Final    Comment:        The GeneXpert MRSA Assay (FDA approved for NASAL specimens only), is one component of a comprehensive MRSA colonization surveillance program. It is not intended to diagnose MRSA infection nor to guide or monitor treatment for MRSA infections. RESULT CALLED TO, READ BACK BY AND VERIFIED WITH: C MOSELEY,RN '@[id number]'$  02/12/16 MKELLY   Respiratory virus panel     Status: None   Collection Time: 02/12/16 10:46 PM  Result Value Ref Range Status   Source - RVPAN NASAL SWAB  Corrected   Respiratory Syncytial Virus A Negative Negative Final   Respiratory Syncytial  Virus B Negative Negative Final   Influenza A Negative Negative Final   Influenza B Negative Negative Final   Parainfluenza 1 Negative Negative Final   Parainfluenza 2 Negative Negative Final   Parainfluenza 3 Negative Negative Final   Metapneumovirus Negative Negative Final   Rhinovirus Negative Negative Final   Adenovirus Negative Negative Final    Comment: (NOTE) Performed At: [facility] [place], [place] [id number] Lindon Romp MD KW:[id number]   Culture, sputum-assessment     Status: None   Collection Time: 02/14/16  7:48 AM  Result Value Ref Range Status   Specimen Description SPUTUM  Final   Special Requests NONE  Final   Sputum evaluation THIS SPECIMEN IS ACCEPTABLE FOR SPUTUM CULTURE  Final   Report Status 02/14/2016 FINAL  Final  Culture, respiratory (NON-Expectorated)     Status: None   Collection Time: 02/14/16 10:02 AM  Result Value Ref Range Status   Specimen Description SPUTUM  Final   Special Requests NONE  Final   Gram Stain   Final    MODERATE WBC PRESENT, PREDOMINANTLY PMN RARE SQUAMOUS EPITHELIAL CELLS PRESENT MODERATE GRAM POSITIVE COCCI IN PAIRS RARE GRAM POSITIVE RODS Performed at [facility]    Culture   Final    NORMAL OROPHARYNGEAL FLORA Performed at [facility]    Report Status 02/16/2016 FINAL  Final    Radiology Reports X-ray Chest Pa And Lateral  02/13/2016  CLINICAL DATA:  Cough, follow-up.  Prior smoker. EXAM: CHEST  2 VIEW COMPARISON:  02/12/2016 FINDINGS: There is hyperinflation of the lungs compatible with COPD. Linear scarring or atelectasis in the lung bases. Small bilateral pleural effusions. Heart is normal size. No acute bony abnormality. IMPRESSION: COPD.  Bibasilar atelectasis or scarring.  Small effusions. Electronically Signed   By: Rolm Baptise M.D.   On: 02/13/2016 10:36   Dg Chest Port 1 View  02/18/2016  CLINICAL DATA:  Hypoxia EXAM: PORTABLE CHEST 1 VIEW COMPARISON:  February 17, 2016 FINDINGS: Endotracheal tube tip is 7.3 cm above the carina. Central catheter tip is in the superior vena cava. Nasogastric tube tip and side port are in the stomach. No pneumothorax. Lungs are hyperexpanded. There is a persistent small left pleural effusion with bibasilar atelectasis. No new opacity. No change in cardiac silhouette. IMPRESSION: Tube and catheter positions as described without pneumothorax. Small left pleural effusion with bibasilar atelectasis. No new opacity. No change in cardiac silhouette. Electronically Signed   By: Lowella Grip III M.D.   On: 02/18/2016 07:16   Dg Chest Port 1 View  02/17/2016  CLINICAL DATA:  Hypoxia EXAM: PORTABLE CHEST 1 VIEW COMPARISON:  February 16, 2016 FINDINGS: Endotracheal tube tip is 2.9 cm above the carina. Nasogastric tube tip and side port are in the stomach. Central catheter tip is in the superior vena cava. No pneumothorax. There is a persistent small left pleural effusion. There is atelectatic change in the lung bases, more on the left than on the right, stable. No new opacity is evident. Heart size and pulmonary vascular normal. No adenopathy. IMPRESSION: Tube and catheter positions as described without apparent pneumothorax. Left effusion with bibasilar atelectasis, more on the left than on the right, stable. No new opacity. No change in cardiac silhouette. Electronically Signed   By: Lowella Grip III M.D.   On: 02/17/2016 08:03   Dg Chest Port 1 View  02/16/2016  CLINICAL DATA:  Acute respiratory failure EXAM: PORTABLE CHEST - 1 VIEW COMPARISON:  02/15/2016 FINDINGS: Endotracheal tube, nasogastric tube, left IJ central line stable in position. Lungs are hyperinflated with attenuated peripheral bronchovascular markings. Small left pleural effusion with some increase in consolidation/ atelectasis in the basilar segments left lower lobe. No pneumothorax. Visualized skeletal structures are unremarkable. IMPRESSION: 1. Small left effusion with  adjacent infiltrate or atelectasis slightly increased since previous. Electronically Signed   By: Lucrezia Europe M.D.   On: 02/16/2016 08:16   Dg Chest Port 1 View  02/15/2016  CLINICAL DATA:  Acute respiratory failure, central line placement EXAM: PORTABLE CHEST 1 VIEW COMPARISON:  02/15/2016 FINDINGS: Cardiac shadow is stable. A new left jugular central line is noted with the catheter tip in the mid superior vena cava. No pneumothorax is seen. Small left-sided pleural effusion is again noted. The endotracheal tube and nasogastric catheter are stable. The lungs remain hyperinflated. Patchy changes are again seen in the bases but less prominent than on the recent exam. IMPRESSION: No pneumothorax following central line placement. Some improved aeration in the bases bilaterally. Electronically Signed   By: Inez Catalina M.D.   On: 02/15/2016 17:36   Dg Chest Port 1 View  02/15/2016  CLINICAL DATA:  Hypoxia.  Sepsis. EXAM: PORTABLE CHEST 1 VIEW COMPARISON:  02/13/2016 chest radiograph. FINDINGS: Stable cardiomediastinal silhouette with normal heart size. No pneumothorax. Stable small bilateral pleural effusions. Hyperinflated lungs. Emphysema. Patchy consolidation at the left greater than right lung bases, increased bilaterally. IMPRESSION: 1. Patchy consolidation at the left greater than right lung bases, increased bilaterally, most suggestive of multifocal pneumonia or aspiration. 2. Hyperinflated lungs and emphysema, suggesting COPD. 3. Stable small bilateral pleural effusions. Electronically Signed   By: Ilona Sorrel M.D.   On: 02/15/2016 14:25   Dg Chest Portable 1 View  02/12/2016  CLINICAL DATA:  Shortness of breath and cough EXAM: PORTABLE CHEST 1 VIEW COMPARISON:  08/27/2015 FINDINGS: Cardiac shadow is within normal limits. The lungs are hyperaerated bilaterally. Bilateral nipple shadows are seen. No bony abnormality is seen. IMPRESSION: COPD without acute abnormality. Electronically Signed   By: Inez Catalina M.D.   On: 02/12/2016 16:32   Dg Abd Portable 1v  02/15/2016  CLINICAL DATA:  65 year old female with history of nausea and vomiting for 1 day. Hiatal hernia. EXAM: PORTABLE ABDOMEN - 1 VIEW COMPARISON:  No priors. FINDINGS: Gas and stool are seen scattered throughout the colon extending to the level of the distal rectum. No pathologic distension of small bowel is noted. No gross evidence of pneumoperitoneum. IMPRESSION: 1. Nonobstructive bowel gas pattern. 2. No pneumoperitoneum. Electronically Signed   By: Vinnie Langton M.D.   On: 02/15/2016 10:53    Time Spent  in minutes  25   Louellen Molder M.D on 02/20/2016 at 2:10 PM  Between 7am to 7pm - Pager - [phone]  After 7pm go to [email or web address] - password [facility]  [facility] -  Office  [phone]

## 2016-02-20 NOTE — Progress Notes (Signed)
Patient does not remember her last bowel movement and last one documented was day of admission 4/13. Patient refusing PRN enema. Patient states she normally takes colace. Dr. Laverle Patter paged to make aware.

## 2016-02-21 DIAGNOSIS — F411 Generalized anxiety disorder: Secondary | ICD-10-CM

## 2016-02-21 MED ORDER — PREDNISONE 20 MG PO TABS: 20.0000 mg | ORAL_TABLET | Freq: Every day | ORAL | Status: AC

## 2016-02-21 MED ORDER — ENSURE ENLIVE PO LIQD: 237.0000 mL | Freq: Three times a day (TID) | ORAL | Status: DC

## 2016-02-21 MED ORDER — LISINOPRIL 20 MG PO TABS: 20.0000 mg | ORAL_TABLET | Freq: Every day | ORAL | Status: DC

## 2016-02-21 MED ORDER — AMLODIPINE BESYLATE 2.5 MG PO TABS: 2.5000 mg | ORAL_TABLET | Freq: Every day | ORAL | Status: DC

## 2016-02-21 NOTE — Care Management Note (Signed)
Case Management Note  Patient Details  Name: Megan [name] MRN: [id number] Date of Birth: April 18, 1951  Subjective/Objective:  65 yo F with PMH of MI, CHF, COPD, depression, hypertension, anxiety and severe COPD, been managed for COPD exacerbation and PNA. She was intubated 4/16-4/19.                   Action/Plan: received referral to arrange [facility] PT and RN   Expected Discharge Date:    02/21/16              Expected Discharge Plan:  [name]  In-House Referral:     Discharge planning Services  CM Consult  Post Acute Care Choice:    Choice offered to:  Patient  DME Arranged:    DME Agency:     HH Arranged:  RN, PT [place] Agency:  [place]  Status of Service:  Completed, signed off  Medicare Important Message Given:    Date Medicare IM Given:    Medicare IM give by:    Date Additional Medicare IM Given:    Additional Medicare Important Message give by:     If discussed at [place] of Stay Meetings, dates discussed:    Additional Comments: met with pt at bedside. She plans to return home with the support of her daughter who lives with her. She has a RW and a cane. She agreed with HHPT and RN. She doesn't have a preference for a [place] agency. Provided pt with a list of [place] agencies. She agreed to use [facility]. Contacted Tiffany at [facility] for referral.  Norina Buzzard, RN 02/21/2016, 12:40 PM

## 2016-02-21 NOTE — Discharge Summary (Addendum)
Physician Discharge Summary  Megan [name] NWG:[id number] DOB: 02-Mar-1951 DOA: 02/12/2016  PCP: Follows with a provider in [place] date: 02/12/2016 Discharge date: 02/21/2016  Time spent: 35 minutes  Recommendations for Outpatient Follow-up:  Discharge home with home health. Discharged on tapering dose of oral prednisone for the next 12 days. Instructed to schedule appointment with pulmonary in next 3 weeks.  Discharge Diagnoses:  Principal Problem:   Acute respiratory failure with hypoxia and hypercarbia (HCC)   Active Problems:   COPD exacerbation (HCC)   Essential hypertension   Protein-calorie malnutrition, severe   Anxiety state   Chronic post-traumatic stress disorder (PTSD)   Esophageal reflux   CAP (community acquired pneumonia)   Discharge Condition: fair  Diet recommendation: Regular with supplements  Filed Weights   02/17/16 0300 02/19/16 0355 02/21/16 0554  Weight: 108.1 kg (238 lb 5.1 oz) 51.347 kg (113 lb 3.2 oz) 41.912 kg (92 lb 6.4 oz)    History of present illness:  Please refer to admission H&P for details, in brief, 65 year old female with history of 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 Sepsis pathway was initiated and patient admitted to hospitalist service for lobar pneumonia and COPD exacerbation. Patient presented with acute on chronic hypercapnic respiratory failure and required intermittent BiPAP. On 4/16 patient and increased work of breathing with lethargy. ABG showed pH of 7114 and PCO2 undetectable. PC CM was consulted and patient intubated and transferred to ICU. Central line and A-line placed. Patient continued on steroid and empiric antibiotics. Extubated on 4/19 and transferred to hospitalist service on 4/20.  Hospital Course:  Principal Problem:  Acute on chronic respiratory failure with  hypoxia and hypercarbia (HCC) Secondary to COPD exacerbation and lobar pneumonia. Patient required intubation and transfer to ICU. Patient extubated and transferred to medical floor. Remained stable on 2 L via nasal cannula.  She is hemodynamically stable to be discharged home. Continue home inhalers and nebulizers. Will discharge on tapering dose of oral prednisone over the next 12 days. She has completed antibiotics course. Needs follow-up with pulmonary as outpatient. Instructed to call and schedule appointment.   Active Problems:  Essential hypertension Blood pressure elevated and uncontrolled. I have increased her home dose of lisinopril and added low-dose amlodipine.   Protein-calorie malnutrition, severe Added supplement  Anxiety state with PTSD Resume when necessary Xanax.   Esophageal reflux Continue Protonix         Code Status : Full code   Family Communication : None at bedside , call daughter and left a message Disposition Plan : Discharge home with home health PT    Consults : PC CM   Procedures :  Intubation Central line  Discharge Exam: Filed Vitals:   02/21/16 0554 02/21/16 0611  BP: 184/82 172/83  Pulse: 71 66  Temp: 97.9 F (36.6 C)   Resp: 16    VHQ:IONGEXB thin built female not in distress HEENT: no pallor, moist mucosa, supple neck Chest:Diminished bilateral breath sounds, no added sounds  CVS: N S1&S2, no murmurs, rubs or gallop GI: soft, NT, ND, BS+ Musculoskeletal: warm, no edema CNS: AAOX3, non focal   Discharge Instructions    Current Discharge Medication List    START taking these medications   Details  amLODipine (NORVASC) 2.5 MG tablet Take 1 tablet (2.5 mg total) by mouth daily. Qty: 30 tablet, Refills: 0  feeding supplement, ENSURE ENLIVE, (ENSURE ENLIVE) LIQD Take 237 mLs by mouth 3 (three) times daily between meals. Qty: 237 mL, Refills: 12    predniSONE (DELTASONE) 20 MG tablet Take 1 tablet (20 mg  total) by mouth daily with breakfast. Qty: 16 tablet, Refills: 0      CONTINUE these medications which have CHANGED   Details  lisinopril (PRINIVIL,ZESTRIL) 20 MG tablet Take 1 tablet (20 mg total) by mouth daily. Qty: 30 tablet, Refills: 0      CONTINUE these medications which have NOT CHANGED   Details  acetaminophen (TYLENOL) 500 MG tablet Take 500 mg by mouth every 6 (six) hours as needed for mild pain.    albuterol (PROVENTIL HFA;VENTOLIN HFA) 108 (90 BASE) MCG/ACT inhaler Inhale 1 puff into the lungs every 4 (four) hours as needed for wheezing or shortness of breath. Qty: 1 Inhaler, Refills: 0    aspirin 81 MG chewable tablet Chew 81 mg by mouth daily.    busPIRone (BUSPAR) 7.5 MG tablet Take 2 tablets (15 mg total) by mouth 2 (two) times daily. Qty: 60 tablet, Refills: 0    butalbital-acetaminophen-caffeine (FIORICET, ESGIC) 50-325-40 MG tablet Take 1 tablet by mouth 2 (two) times daily as needed for headache. Qty: 14 tablet, Refills: 0    docusate sodium (COLACE) 100 MG capsule Take 100 mg by mouth daily as needed for mild constipation.    Fluticasone-Salmeterol (ADVAIR) 500-50 MCG/DOSE AEPB Inhale 1 puff into the lungs 2 (two) times daily. Qty: 60 each, Refills: 0    ipratropium-albuterol (DUONEB) 0.5-2.5 (3) MG/3ML SOLN Take 3 mLs by nebulization every 6 (six) hours as needed (shortness of breath).    lidocaine (LIDODERM) 5 % Place 0.25 patches onto the skin daily as needed (pain). Remove & Discard patch within 12 hours or as directed by MD    MELATONIN PO Take 1 tablet by mouth at bedtime as needed (sleep).    Multiple Vitamin (MULTIVITAMIN WITH MINERALS) TABS tablet Take 1 tablet by mouth daily.    Polyethyl Glycol-Propyl Glycol (SYSTANE OP) Place 1 drop into both eyes at bedtime.    promethazine (PHENERGAN) 12.5 MG tablet Take 12.5 mg by mouth every 6 (six) hours as needed for nausea or vomiting.    Umeclidinium Bromide (INCRUSE ELLIPTA) 62.5 MCG/INH AEPB Inhale  1 puff into the lungs daily. Qty: 30 each, Refills: 0    ALPRAZolam (XANAX) 0.25 MG tablet Take 1 tablet (0.25 mg total) by mouth 2 (two) times daily as needed for anxiety. Qty: 30 tablet, Refills: 0      STOP taking these medications     escitalopram (LEXAPRO) 20 MG tablet        Allergies  Allergen Reactions  . Penicillins Rash    Has patient had a PCN reaction causing immediate rash, facial/tongue/throat swelling, SOB or lightheadedness with hypotension: No Has patient had a PCN reaction causing severe rash involving mucus membranes or skin necrosis:NO Has patient had a PCN reaction that required hospitalization No Has patient had a PCN reaction occurring within the last 10 years:NO If all of the above answers are "NO", then may proceed with Cephalosporin use.   Follow-up Information    Follow up with Collene Gobble., MD. Schedule an appointment as soon as possible for a visit in 3 weeks.   Specialty:  Pulmonary Disease   Contact information:   [address] [address] [phone]        The results of significant diagnostics from this hospitalization (  including imaging, microbiology, ancillary and laboratory) are listed below for reference.    Significant Diagnostic Studies: X-ray Chest Pa And Lateral  02/13/2016  CLINICAL DATA:  Cough, follow-up.  Prior smoker. EXAM: CHEST  2 VIEW COMPARISON:  02/12/2016 FINDINGS: There is hyperinflation of the lungs compatible with COPD. Linear scarring or atelectasis in the lung bases. Small bilateral pleural effusions. Heart is normal size. No acute bony abnormality. IMPRESSION: COPD.  Bibasilar atelectasis or scarring.  Small effusions. Electronically Signed   By: Rolm Baptise M.D.   On: 02/13/2016 10:36   Dg Chest Port 1 View  02/18/2016  CLINICAL DATA:  Hypoxia EXAM: PORTABLE CHEST 1 VIEW COMPARISON:  February 17, 2016 FINDINGS: Endotracheal tube tip is 7.3 cm above the carina. Central catheter tip is in the superior  vena cava. Nasogastric tube tip and side port are in the stomach. No pneumothorax. Lungs are hyperexpanded. There is a persistent small left pleural effusion with bibasilar atelectasis. No new opacity. No change in cardiac silhouette. IMPRESSION: Tube and catheter positions as described without pneumothorax. Small left pleural effusion with bibasilar atelectasis. No new opacity. No change in cardiac silhouette. Electronically Signed   By: Lowella Grip III M.D.   On: 02/18/2016 07:16   Dg Chest Port 1 View  02/17/2016  CLINICAL DATA:  Hypoxia EXAM: PORTABLE CHEST 1 VIEW COMPARISON:  February 16, 2016 FINDINGS: Endotracheal tube tip is 2.9 cm above the carina. Nasogastric tube tip and side port are in the stomach. Central catheter tip is in the superior vena cava. No pneumothorax. There is a persistent small left pleural effusion. There is atelectatic change in the lung bases, more on the left than on the right, stable. No new opacity is evident. Heart size and pulmonary vascular normal. No adenopathy. IMPRESSION: Tube and catheter positions as described without apparent pneumothorax. Left effusion with bibasilar atelectasis, more on the left than on the right, stable. No new opacity. No change in cardiac silhouette. Electronically Signed   By: Lowella Grip III M.D.   On: 02/17/2016 08:03   Dg Chest Port 1 View  02/16/2016  CLINICAL DATA:  Acute respiratory failure EXAM: PORTABLE CHEST - 1 VIEW COMPARISON:  02/15/2016 FINDINGS: Endotracheal tube, nasogastric tube, left IJ central line stable in position. Lungs are hyperinflated with attenuated peripheral bronchovascular markings. Small left pleural effusion with some increase in consolidation/ atelectasis in the basilar segments left lower lobe. No pneumothorax. Visualized skeletal structures are unremarkable. IMPRESSION: 1. Small left effusion with adjacent infiltrate or atelectasis slightly increased since previous. Electronically Signed   By: Lucrezia Europe M.D.   On: 02/16/2016 08:16   Dg Chest Port 1 View  02/15/2016  CLINICAL DATA:  Acute respiratory failure, central line placement EXAM: PORTABLE CHEST 1 VIEW COMPARISON:  02/15/2016 FINDINGS: Cardiac shadow is stable. A new left jugular central line is noted with the catheter tip in the mid superior vena cava. No pneumothorax is seen. Small left-sided pleural effusion is again noted. The endotracheal tube and nasogastric catheter are stable. The lungs remain hyperinflated. Patchy changes are again seen in the bases but less prominent than on the recent exam. IMPRESSION: No pneumothorax following central line placement. Some improved aeration in the bases bilaterally. Electronically Signed   By: Inez Catalina M.D.   On: 02/15/2016 17:36   Dg Chest Port 1 View  02/15/2016  CLINICAL DATA:  Hypoxia.  Sepsis. EXAM: PORTABLE CHEST 1 VIEW COMPARISON:  02/13/2016 chest radiograph. FINDINGS: Stable cardiomediastinal silhouette with normal heart  size. No pneumothorax. Stable small bilateral pleural effusions. Hyperinflated lungs. Emphysema. Patchy consolidation at the left greater than right lung bases, increased bilaterally. IMPRESSION: 1. Patchy consolidation at the left greater than right lung bases, increased bilaterally, most suggestive of multifocal pneumonia or aspiration. 2. Hyperinflated lungs and emphysema, suggesting COPD. 3. Stable small bilateral pleural effusions. Electronically Signed   By: Ilona Sorrel M.D.   On: 02/15/2016 14:25   Dg Chest Portable 1 View  02/12/2016  CLINICAL DATA:  Shortness of breath and cough EXAM: PORTABLE CHEST 1 VIEW COMPARISON:  08/27/2015 FINDINGS: Cardiac shadow is within normal limits. The lungs are hyperaerated bilaterally. Bilateral nipple shadows are seen. No bony abnormality is seen. IMPRESSION: COPD without acute abnormality. Electronically Signed   By: Inez Catalina M.D.   On: 02/12/2016 16:32   Dg Abd Portable 1v  02/15/2016  CLINICAL DATA:  65 year old  female with history of nausea and vomiting for 1 day. Hiatal hernia. EXAM: PORTABLE ABDOMEN - 1 VIEW COMPARISON:  No priors. FINDINGS: Gas and stool are seen scattered throughout the colon extending to the level of the distal rectum. No pathologic distension of small bowel is noted. No gross evidence of pneumoperitoneum. IMPRESSION: 1. Nonobstructive bowel gas pattern. 2. No pneumoperitoneum. Electronically Signed   By: Vinnie Langton M.D.   On: 02/15/2016 10:53    Microbiology: Recent Results (from the past 240 hour(s))  Blood Culture (routine x 2)     Status: None   Collection Time: 02/12/16  4:32 PM  Result Value Ref Range Status   Specimen Description BLOOD RIGHT ANTECUBITAL  Final   Special Requests IN PEDIATRIC BOTTLE 1CC  Final   Culture NO GROWTH 5 DAYS  Final   Report Status 02/17/2016 FINAL  Final  Blood Culture (routine x 2)     Status: None   Collection Time: 02/12/16  4:44 PM  Result Value Ref Range Status   Specimen Description BLOOD RIGHT WRIST  Final   Special Requests IN PEDIATRIC BOTTLE 2CC  Final   Culture NO GROWTH 5 DAYS  Final   Report Status 02/17/2016 FINAL  Final  Urine culture     Status: None   Collection Time: 02/12/16  8:26 PM  Result Value Ref Range Status   Specimen Description URINE, RANDOM  Final   Special Requests NONE  Final   Culture MULTIPLE SPECIES PRESENT, SUGGEST RECOLLECTION  Final   Report Status 02/14/2016 FINAL  Final  MRSA PCR Screening     Status: Abnormal   Collection Time: 02/12/16  9:15 PM  Result Value Ref Range Status   MRSA by PCR POSITIVE (A) NEGATIVE Final    Comment:        The GeneXpert MRSA Assay (FDA approved for NASAL specimens only), is one component of a comprehensive MRSA colonization surveillance program. It is not intended to diagnose MRSA infection nor to guide or monitor treatment for MRSA infections. RESULT CALLED TO, READ BACK BY AND VERIFIED WITH: C MOSELEY,RN '@[id number]'$  02/12/16 MKELLY   Respiratory virus  panel     Status: None   Collection Time: 02/12/16 10:46 PM  Result Value Ref Range Status   Source - RVPAN NASAL SWAB  Corrected   Respiratory Syncytial Virus A Negative Negative Final   Respiratory Syncytial Virus B Negative Negative Final   Influenza A Negative Negative Final   Influenza B Negative Negative Final   Parainfluenza 1 Negative Negative Final   Parainfluenza 2 Negative Negative Final   Parainfluenza 3 Negative Negative  Final   Metapneumovirus Negative Negative Final   Rhinovirus Negative Negative Final   Adenovirus Negative Negative Final    Comment: (NOTE) Performed At: [facility] [place], [place] [id number] Lindon Romp MD RF:[id number]   Culture, sputum-assessment     Status: None   Collection Time: 02/14/16  7:48 AM  Result Value Ref Range Status   Specimen Description SPUTUM  Final   Special Requests NONE  Final   Sputum evaluation THIS SPECIMEN IS ACCEPTABLE FOR SPUTUM CULTURE  Final   Report Status 02/14/2016 FINAL  Final  Culture, respiratory (NON-Expectorated)     Status: None   Collection Time: 02/14/16 10:02 AM  Result Value Ref Range Status   Specimen Description SPUTUM  Final   Special Requests NONE  Final   Gram Stain   Final    MODERATE WBC PRESENT, PREDOMINANTLY PMN RARE SQUAMOUS EPITHELIAL CELLS PRESENT MODERATE GRAM POSITIVE COCCI IN PAIRS RARE GRAM POSITIVE RODS Performed at [facility]    Culture   Final    NORMAL OROPHARYNGEAL FLORA Performed at [facility]    Report Status 02/16/2016 FINAL  Final     Labs: Basic Metabolic Panel:  Recent Labs Lab 02/15/16 1439 02/16/16 0400 02/17/16 0405 02/18/16 0505 02/19/16 0340 02/20/16 0746  NA 138 140 141 140 139 137  K 4.3 4.0 4.3 4.3 4.4 4.2  CL 91* 98* 98* 97* 94* 88*  CO2 35* 32 33* 34* 37* 38*  GLUCOSE 182* 114* 130* 151* 107* 64*  BUN '11 16 15 '$ 22* 20 17  CREATININE 0.58 0.70 0.65 0.62 0.60 0.62  CALCIUM 8.5* 7.7* 8.2* 8.4* 8.4*  8.2*  MG 1.6* 1.5*  --  2.0 1.9  --   PHOS 3.8 2.2*  --  4.1 4.2  --    Liver Function Tests: No results for input(s): AST, ALT, ALKPHOS, BILITOT, PROT, ALBUMIN in the last 168 hours. No results for input(s): LIPASE, AMYLASE in the last 168 hours. No results for input(s): AMMONIA in the last 168 hours. CBC:  Recent Labs Lab 02/16/16 0400 02/17/16 0405 02/18/16 0505 02/19/16 0340 02/20/16 0746  WBC 6.6 6.8 6.5 10.1 11.7*  HGB 10.1* 10.6* 11.2* 11.3* 13.0  HCT 32.1* 33.2* 35.4* 36.0 39.0  MCV 99.1 98.5 97.3 98.1 97.3  PLT 263 241 250 335 406*   Cardiac Enzymes:  Recent Labs Lab 02/15/16 1439 02/15/16 2112 02/16/16 0225 02/17/16 0405 02/17/16 0940  TROPONINI 0.23* 0.04* 0.04* <0.03 <0.03   BNP: BNP (last 3 results)  Recent Labs  02/12/16 1645  BNP 132.4*    ProBNP (last 3 results) No results for input(s): PROBNP in the last 8760 hours.  CBG:  Recent Labs Lab 02/18/16 0021 02/18/16 0403 02/18/16 0811 02/18/16 1138 02/18/16 1530  GLUCAP 154* 139* 148* 109* 111*       Signed:  Louellen Molder MD.  [facility] 02/21/2016, 11:28 AM

## 2016-02-21 NOTE — Progress Notes (Signed)
Discharge paperwork given to patient. IV removed. Per patient, her ride cannot bring her oxygen up to the floor. Per patient, she can hook her home O2 machine up downstairs in the car. No other questions or concerns verbalized.

## 2016-02-21 NOTE — Progress Notes (Signed)
PT Cancellation Note  Patient Details Name: Megan [name] MRN: [id number] DOB: Jun 04, 1951   Cancelled Treatment:    Reason Eval/Treat Not Completed: Patient declined and states that she is probably going home later. She declined any need for PT services at this time.    Cassell Clement, PT, CSCS Pager [phone] Office [phone]  02/21/2016, 2:09 PM

## 2016-02-21 NOTE — Discharge Instructions (Signed)
Chronic Obstructive Pulmonary Disease °Chronic obstructive pulmonary disease (COPD) is a common lung problem. In COPD, the flow of air from the lungs is limited. The way your lungs work will probably never return to normal, but there are things you can do to improve your lungs and make yourself feel better. Your doctor may treat your condition with: °· Medicines. °· Oxygen. °· Lung surgery. °· Changes to your diet. °· Rehabilitation. This may involve a team of specialists. °HOME CARE °· Take all medicines as told by your doctor. °· Avoid medicines or cough syrups that dry up your airway (such as antihistamines) and do not allow you to get rid of thick spit. You do not need to avoid them if told differently by your doctor. °· If you smoke, stop. Smoking makes the problem worse. °· Avoid being around things that make your breathing worse (like smoke, chemicals, and fumes). °· Use oxygen therapy and therapy to help improve your lungs (pulmonary rehabilitation) if told by your doctor. If you need home oxygen therapy, ask your doctor if you should buy a tool to measure your oxygen level (oximeter). °· Avoid people who have a sickness you can catch (contagious). °· Avoid going outside when it is very hot, cold, or humid. °· Eat healthy foods. Eat smaller meals more often. Rest before meals. °· Stay active, but remember to also rest. °· Make sure to get all the shots (vaccines) your doctor recommends. Ask your doctor if you need a pneumonia shot. °· Learn and use tips on how to relax. °· Learn and use tips on how to control your breathing as told by your doctor. Try: °¨ Breathing in (inhaling) through your nose for 1 second. Then, pucker your lips and breath out (exhale) through your lips for 2 seconds. °¨ Putting one hand on your belly (abdomen). Breathe in slowly through your nose for 1 second. Your hand on your belly should move out. Pucker your lips and breathe out slowly through your lips. Your hand on your belly  should move in as you breathe out. °· Learn and use controlled coughing to clear thick spit from your lungs. The steps are: °1. Lean your head a little forward. °2. Breathe in deeply. °3. Try to hold your breath for 3 seconds. °4. Keep your mouth slightly open while coughing 2 times. °5. Spit any thick spit out into a tissue. °6. Rest and do the steps again 1 or 2 times as needed. °GET HELP IF: °· You cough up more thick spit than usual. °· There is a change in the color or thickness of the spit. °· It is harder to breathe than usual. °· Your breathing is faster than usual. °GET HELP RIGHT AWAY IF: °· You have shortness of breath while resting. °· You have shortness of breath that stops you from: °¨ Being able to talk. °¨ Doing normal activities. °· You chest hurts for longer than 5 minutes. °· Your skin color is more blue than usual. °· Your pulse oximeter shows that you have low oxygen for longer than 5 minutes. °MAKE SURE YOU: °· Understand these instructions. °· Will watch your condition. °· Will get help right away if you are not doing well or get worse. °  °This information is not intended to replace advice given to you by your health care provider. Make sure you discuss any questions you have with your health care provider. °  °Document Released: 04/05/2008 Document Revised: 11/08/2014 Document Reviewed: 06/14/2013 °Elsevier Interactive Patient   Education ©2016 [employer] ° °

## 2016-02-23 ENCOUNTER — Telehealth: Payer: Self-pay | Admitting: Emergency Medicine

## 2016-02-23 DIAGNOSIS — F419 Anxiety disorder, unspecified: Secondary | ICD-10-CM | POA: Diagnosis not present

## 2016-02-23 DIAGNOSIS — I10 Essential (primary) hypertension: Secondary | ICD-10-CM | POA: Diagnosis not present

## 2016-02-23 DIAGNOSIS — I252 Old myocardial infarction: Secondary | ICD-10-CM | POA: Diagnosis not present

## 2016-02-23 DIAGNOSIS — K219 Gastro-esophageal reflux disease without esophagitis: Secondary | ICD-10-CM | POA: Diagnosis not present

## 2016-02-23 DIAGNOSIS — F4310 Post-traumatic stress disorder, unspecified: Secondary | ICD-10-CM | POA: Diagnosis not present

## 2016-02-23 DIAGNOSIS — J441 Chronic obstructive pulmonary disease with (acute) exacerbation: Secondary | ICD-10-CM | POA: Diagnosis not present

## 2016-02-23 DIAGNOSIS — I509 Heart failure, unspecified: Secondary | ICD-10-CM | POA: Diagnosis not present

## 2016-02-23 DIAGNOSIS — Z87891 Personal history of nicotine dependence: Secondary | ICD-10-CM | POA: Diagnosis not present

## 2016-02-23 DIAGNOSIS — F329 Major depressive disorder, single episode, unspecified: Secondary | ICD-10-CM | POA: Diagnosis not present

## 2016-02-23 DIAGNOSIS — D649 Anemia, unspecified: Secondary | ICD-10-CM | POA: Diagnosis not present

## 2016-02-23 DIAGNOSIS — Z8701 Personal history of pneumonia (recurrent): Secondary | ICD-10-CM | POA: Diagnosis not present

## 2016-02-23 DIAGNOSIS — E43 Unspecified severe protein-calorie malnutrition: Secondary | ICD-10-CM | POA: Diagnosis not present

## 2016-02-23 NOTE — Telephone Encounter (Signed)
Spoke with pt's daughter. States that pt needs a hospital follow up this week. Her BP has been running low and needs to have this checked. Pt has been scheduled with [facility] since pt saw one of our docs in the hospital. HFU is 02/24/16 at 3:45pm. Nothing further was needed.

## 2016-02-24 ENCOUNTER — Encounter: Payer: Medicare Other | Admitting: Acute Care

## 2016-02-25 ENCOUNTER — Ambulatory Visit (INDEPENDENT_AMBULATORY_CARE_PROVIDER_SITE_OTHER): Payer: Medicare Other | Admitting: Acute Care

## 2016-02-25 ENCOUNTER — Encounter: Payer: Self-pay | Admitting: Acute Care

## 2016-02-25 ENCOUNTER — Ambulatory Visit (INDEPENDENT_AMBULATORY_CARE_PROVIDER_SITE_OTHER)
Admission: RE | Admit: 2016-02-25 | Discharge: 2016-02-25 | Disposition: A | Discharge status: Home / Self Care | Payer: Medicare Other | Source: Ambulatory Visit | Attending: Acute Care | Admitting: Acute Care

## 2016-02-25 ENCOUNTER — Other Ambulatory Visit (INDEPENDENT_AMBULATORY_CARE_PROVIDER_SITE_OTHER): Payer: Medicare Other

## 2016-02-25 VITALS — HR 106 | Wt 83.0 lb

## 2016-02-25 DIAGNOSIS — E43 Unspecified severe protein-calorie malnutrition: Secondary | ICD-10-CM

## 2016-02-25 DIAGNOSIS — J189 Pneumonia, unspecified organism: Secondary | ICD-10-CM

## 2016-02-25 DIAGNOSIS — J441 Chronic obstructive pulmonary disease with (acute) exacerbation: Secondary | ICD-10-CM

## 2016-02-25 DIAGNOSIS — R918 Other nonspecific abnormal finding of lung field: Secondary | ICD-10-CM

## 2016-02-25 DIAGNOSIS — R05 Cough: Secondary | ICD-10-CM | POA: Diagnosis not present

## 2016-02-25 DIAGNOSIS — Z7689 Persons encountering health services in other specified circumstances: Secondary | ICD-10-CM

## 2016-02-25 DIAGNOSIS — I2109 ST elevation (STEMI) myocardial infarction involving other coronary artery of anterior wall: Secondary | ICD-10-CM

## 2016-02-25 DIAGNOSIS — Z7189 Other specified counseling: Secondary | ICD-10-CM

## 2016-02-25 LAB — CBC WITH DIFFERENTIAL/PLATELET
Basophils Absolute: 0 10*3/uL (ref 0.0–0.1)
Basophils Relative: 0.3 % (ref 0.0–3.0)
EOS ABS: 0.1 10*3/uL (ref 0.0–0.7)
Eosinophils Relative: 0.8 % (ref 0.0–5.0)
HCT: 37.9 % (ref 36.0–46.0)
HEMOGLOBIN: 12.4 g/dL (ref 12.0–15.0)
Lymphocytes Relative: 20.1 % (ref 12.0–46.0)
Lymphs Abs: 2.2 10*3/uL (ref 0.7–4.0)
MCHC: 32.8 g/dL (ref 30.0–36.0)
MCV: 96.5 fl (ref 78.0–100.0)
MONO ABS: 1.4 10*3/uL — AB (ref 0.1–1.0)
Monocytes Relative: 12.9 % — ABNORMAL HIGH (ref 3.0–12.0)
NEUTROS PCT: 65.9 % (ref 43.0–77.0)
Neutro Abs: 7.3 10*3/uL (ref 1.4–7.7)
Platelets: 504 10*3/uL — ABNORMAL HIGH (ref 150.0–400.0)
RBC: 3.93 Mil/uL (ref 3.87–5.11)
RDW: 13.2 % (ref 11.5–15.5)
WBC: 11.1 10*3/uL — AB (ref 4.0–10.5)

## 2016-02-25 NOTE — Assessment & Plan Note (Addendum)
Will need follow up CT scan of chest to evaluate pulmonary nodules for stability when stronger.  ( Per radiology recommendation 01/2016-08/2016)

## 2016-02-25 NOTE — Patient Instructions (Addendum)
It is nice to meet you today. We will do a CXR today. CBC with diff today. Your blood pressure was 90/60 today. Do not take any blood pressure lowering medications. Referral to [facility] soon. Please try for appointment within next 2 weeks Buy a scale and weigh every morning after you empty your bladder.  You need to work on weight gain. Boost / Ensure supplement. Eat small frequent meals until you get your appetite back. We will renew the prescription to [facility] for Oxygen. ( walked in office today to qualify) Referral to Pulmonary rehab Follow up with Dr. Lamonte Sakai in 1 month.( 2 weeks if no PCP appointment available) Please contact office for sooner follow up if symptoms do not improve or worsen or seek emergency care

## 2016-02-25 NOTE — Progress Notes (Signed)
Subjective:    Patient ID: Megan [name], female    DOB: [birth year], 65 y.o.   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 Recent hospitalization(01/2016)  for lobar pneumonia and COPD exacerbation requiring intubation x 4 days.She was seen and managed by CCM during hospitalization.  Significant Events/Procedures: Recent Hospitalization:  Admit date: 02/12/2016 Discharge date: 02/21/2016  Discharge Diagnoses:  Principal Problem:  Acute respiratory failure with hypoxia and hypercarbia (HCC)  Active Problems:  COPD exacerbation (HCC)  Essential hypertension  Protein-calorie malnutrition, severe  Anxiety state  Chronic post-traumatic stress disorder (PTSD)  Esophageal reflux  CAP (community acquired pneumonia)  ETT: 02/15/2016-02/18/2016:  Discharged on home maintenance regimen of Advair 1 puff twice daily, Incruse Ellipta 1 puff daily,and Duonebs every 6 hours as needed for SOB/ wheezing.  Additional medications: Prednisone Taper 20 mg po with breakfast daily x 16 days. She has not started taking this yet.  08/27/2015: CT Angio Chest: 5 mm nodule at the right middle lobe. If the patient is at high risk for bronchogenic carcinoma, follow-up chest CT at 6-12 months is recommended  02/18/2016 CXR IMPRESSION: Tube and catheter positions as described without pneumothorax. Small left pleural effusion with bibasilar atelectasis. No new opacity. No change in cardiac silhouette.  02/25/2016: CXR IMPRESSION: COPD without evidence of pneumonia or CHF. Interval resolution of small left pleural effusion.  02/25/2016: CBC/Diff:  Results for Megan [name], Megan [name] (MRN [id number]) as of 02/25/2016 21:39  Ref. Range 02/25/2016 16:10  WBC Latest Ref Range: 4.0-10.5 K/uL 11.1 (H)  RBC Latest Ref Range: 3.87-5.11 Mil/uL 3.93  Hemoglobin Latest Ref Range: 12.0-15.0 g/dL 12.4  HCT  Latest Ref Range: 36.0-46.0 % 37.9  MCV Latest Ref Range: 78.0-100.0 fl 96.5  MCHC Latest Ref Range: 30.0-36.0 g/dL 32.8  RDW Latest Ref Range: 11.5-15.5 % 13.2  Platelets  504.0 (H)    02/25/2016 Hospital Follow Up:  Pt presents to the office for follow up of hospitalization from 02/12/16-02/21/16.She was treated for acute on chronic hypercarbic, hypoxic respiratory failure (CAP and COPD exacerbation) with IV antibiotics, IV steroids and scheduled BD.She presents today with No fever, but some chills ,Coughing up some clear secretions, states she feels better, but is profoundly deconditioned, and weak.She has lost a significant amount of weight while in the hospital and has just started getting her appetite back within the last day.She was discharged on 2 anti-hypertensives. Her blood pressure today in the office is 90/60, and she has not been taking the blood pressure medication due to dizziness.She does not have a PCP in [place].She is here with her daughter in law. Once stronger she will need  Pulmonary rehab.We had a long discussion about eating and hydrating with a goal of weight gain.   Current outpatient prescriptions:  .  acetaminophen (TYLENOL) 500 MG tablet, Take 500 mg by mouth every 6 (six) hours as needed for mild pain., Disp: , Rfl:  .  albuterol (PROVENTIL HFA;VENTOLIN HFA) 108 (90 BASE) MCG/ACT inhaler, Inhale 1 puff into the lungs every 4 (four) hours as needed for wheezing or shortness of breath., Disp: 1 Inhaler, Rfl: 0 .  ALPRAZolam (XANAX) 0.25 MG tablet, Take 1 tablet (0.25 mg total) by mouth 2 (two) times daily as needed for anxiety., Disp: 30 tablet, Rfl: 0 .  aspirin 81 MG chewable tablet, Chew 81 mg by mouth daily., Disp: , Rfl:  .  busPIRone (BUSPAR) 7.5 MG tablet, Take  2 tablets (15 mg total) by mouth 2 (two) times daily., Disp: 60 tablet, Rfl: 0 .  butalbital-acetaminophen-caffeine (FIORICET, ESGIC) 50-325-40 MG tablet, Take 1 tablet by mouth 2 (two) times daily as  needed for headache., Disp: 14 tablet, Rfl: 0 .  docusate sodium (COLACE) 100 MG capsule, Take 100 mg by mouth daily as needed for mild constipation., Disp: , Rfl:  .  feeding supplement, ENSURE ENLIVE, (ENSURE ENLIVE) LIQD, Take 237 mLs by mouth 3 (three) times daily between meals., Disp: 237 mL, Rfl: 12 .  Fluticasone-Salmeterol (ADVAIR) 500-50 MCG/DOSE AEPB, Inhale 1 puff into the lungs 2 (two) times daily., Disp: 60 each, Rfl: 0 .  ipratropium-albuterol (DUONEB) 0.5-2.5 (3) MG/3ML SOLN, Take 3 mLs by nebulization every 6 (six) hours as needed (shortness of breath)., Disp: , Rfl:  .  lidocaine (LIDODERM) 5 %, Place 0.25 patches onto the skin daily as needed (pain). Remove & Discard patch within 12 hours or as directed by MD, Disp: , Rfl:  .  MELATONIN PO, Take 1 tablet by mouth at bedtime as needed (sleep)., Disp: , Rfl:  .  Multiple Vitamin (MULTIVITAMIN WITH MINERALS) TABS tablet, Take 1 tablet by mouth daily., Disp: , Rfl:  .  Polyethyl Glycol-Propyl Glycol (SYSTANE OP), Place 1 drop into both eyes at bedtime., Disp: , Rfl:  .  predniSONE (DELTASONE) 20 MG tablet, Take 1 tablet (20 mg total) by mouth daily with breakfast., Disp: 16 tablet, Rfl: 0 .  promethazine (PHENERGAN) 12.5 MG tablet, Take 12.5 mg by mouth every 6 (six) hours as needed for nausea or vomiting., Disp: , Rfl:  .  Umeclidinium Bromide (INCRUSE ELLIPTA) 62.5 MCG/INH AEPB, Inhale 1 puff into the lungs daily., Disp: 30 each, Rfl: 0 .  amLODipine (NORVASC) 2.5 MG tablet, Take 1 tablet (2.5 mg total) by mouth daily. (Patient not taking: Reported on 02/25/2016), Disp: 30 tablet, Rfl: 0 .  lisinopril (PRINIVIL,ZESTRIL) 20 MG tablet, Take 1 tablet (20 mg total) by mouth daily. (Patient not taking: Reported on 02/25/2016), Disp: 30 tablet, Rfl: 0   Past Medical History  Diagnosis Date  . Myocardial infarction ([place])   . CHF (congestive heart failure) ([place])   . COPD (chronic obstructive pulmonary disease) ([place])   . Shortness of breath  dyspnea   . Depression   . GERD (gastroesophageal reflux disease)   . History of hiatal hernia   . Headache   . Anemia   . Essential hypertension 08/19/2015  . Anxiety state 08/20/2015  . CAP (community acquired pneumonia)     Allergies  Allergen Reactions  . Penicillins Rash    Has patient had a PCN reaction causing immediate rash, facial/tongue/throat swelling, SOB or lightheadedness with hypotension: No Has patient had a PCN reaction causing severe rash involving mucus membranes or skin necrosis:NO Has patient had a PCN reaction that required hospitalization No Has patient had a PCN reaction occurring within the last 10 years:NO If all of the above answers are "NO", then may proceed with Cephalosporin use.    Review of Systems Constitutional:   +  weight loss, no night sweats,  No Fevers, +chills, +fatigue, or  lassitude.  HEENT:   No headaches,  Difficulty swallowing,  Tooth/dental problems, or  Sore throat,                No sneezing, itching, ear ache, nasal congestion, post nasal drip,   CV:  No chest pain,  Orthopnea, PND, swelling in lower extremities, anasarca, dizziness, palpitations, syncope.  GI  No heartburn, indigestion, abdominal pain, nausea, vomiting, diarrhea, change in bowel habits, loss of appetite, bloody stools. No appetite  Resp: + shortness of breath with exertion not at rest.  No excess mucus, no productive cough,  No non-productive cough,  No coughing up of blood.  No change in color of mucus.  No wheezing.  No chest wall deformity  Skin: no rash or lesions.  GU: no dysuria, change in color of urine, no urgency or frequency.  No flank pain, no hematuria   MS:  No joint pain or swelling.  No decreased range of motion.  No back pain.  Psych:  No change in mood or affect. No depression + anxiety.  No memory loss.        Objective:   Physical Exam Pulse 106  Wt 83 lb (37.649 kg)  SpO2 96%  BP Checked by me 90/55, with child cuff.  Physical  Exam:  General- No distress,  A&Ox3, thin, anxious female wearing oxygen ENT: No sinus tenderness, TM clear, pale nasal mucosa, no oral exudate,no post nasal drip, no LAN Cardiac: S1, S2, regular rate and rhythm, no murmur Chest: No wheeze/ rales/ dullness; no accessory muscle use, no nasal flaring, no sternal retractions Abd.: Soft Non-tender Ext: No clubbing cyanosis, edema Neuro:  Profoundly weakened and deconditioned Skin: No rashes, warm and dry Psych: anxious       Assessment & Plan:

## 2016-02-25 NOTE — Assessment & Plan Note (Signed)
Significant Weight Loss and deconditioning with recent hospialization/ intubation: Plan: Weigh every morning with same scale after first void. Follow up with PCP/ Pulm. If you continue to lose weight. Frequent small meals Boost/ Ensure Supplement Please contact office for sooner follow up if symptoms do not improve or worsen or seek emergency care

## 2016-02-26 ENCOUNTER — Other Ambulatory Visit: Payer: Self-pay | Admitting: Emergency Medicine

## 2016-02-26 ENCOUNTER — Telehealth: Payer: Self-pay | Admitting: Emergency Medicine

## 2016-02-26 DIAGNOSIS — J449 Chronic obstructive pulmonary disease, unspecified: Secondary | ICD-10-CM

## 2016-02-26 NOTE — Telephone Encounter (Signed)
FYI for Dr Lamonte Sakai - Pt was ordered home health PT after discharge but pt is refusing this.

## 2016-02-26 NOTE — Telephone Encounter (Signed)
Thank you :)

## 2016-02-26 NOTE — Telephone Encounter (Signed)
LM for Megan [name] x 1

## 2016-02-28 DIAGNOSIS — E43 Unspecified severe protein-calorie malnutrition: Secondary | ICD-10-CM | POA: Diagnosis not present

## 2016-02-28 DIAGNOSIS — F4310 Post-traumatic stress disorder, unspecified: Secondary | ICD-10-CM | POA: Diagnosis not present

## 2016-02-28 DIAGNOSIS — I10 Essential (primary) hypertension: Secondary | ICD-10-CM | POA: Diagnosis not present

## 2016-02-28 DIAGNOSIS — J441 Chronic obstructive pulmonary disease with (acute) exacerbation: Secondary | ICD-10-CM | POA: Diagnosis not present

## 2016-02-28 DIAGNOSIS — Z8701 Personal history of pneumonia (recurrent): Secondary | ICD-10-CM | POA: Diagnosis not present

## 2016-02-28 DIAGNOSIS — I509 Heart failure, unspecified: Secondary | ICD-10-CM | POA: Diagnosis not present

## 2016-03-03 ENCOUNTER — Telehealth: Payer: Self-pay | Admitting: Acute Care

## 2016-03-03 DIAGNOSIS — I509 Heart failure, unspecified: Secondary | ICD-10-CM | POA: Diagnosis not present

## 2016-03-03 DIAGNOSIS — F4310 Post-traumatic stress disorder, unspecified: Secondary | ICD-10-CM | POA: Diagnosis not present

## 2016-03-03 DIAGNOSIS — J441 Chronic obstructive pulmonary disease with (acute) exacerbation: Secondary | ICD-10-CM | POA: Diagnosis not present

## 2016-03-03 DIAGNOSIS — E43 Unspecified severe protein-calorie malnutrition: Secondary | ICD-10-CM | POA: Diagnosis not present

## 2016-03-03 DIAGNOSIS — Z8701 Personal history of pneumonia (recurrent): Secondary | ICD-10-CM | POA: Diagnosis not present

## 2016-03-03 DIAGNOSIS — I10 Essential (primary) hypertension: Secondary | ICD-10-CM | POA: Diagnosis not present

## 2016-03-03 NOTE — Telephone Encounter (Signed)
Called and spoke to pt. Pt requesting the results of CXR and labs. Pt also requesting medication for anxiety and migraines, advised her PCP will need to fill these medications. Advised pt that a referral was placed to primary care when she had OV with SG. Will forward to PCC's to help facilitate the pt's with PCP.   Sarah please advise on pt's results.  PCC's please advise on PCP referral.

## 2016-03-04 ENCOUNTER — Telehealth: Payer: Self-pay | Admitting: Acute Care

## 2016-03-04 NOTE — Telephone Encounter (Signed)
Please see phone note from 5.4.17. Will sign off.

## 2016-03-04 NOTE — Telephone Encounter (Signed)
Check with PCC's in AM to see if there is something they can do to get the patient in sooner with [facility] since its after 5pm today.  Hopefully we can get a sooner appt with PCP than 1 month out. Will need to call the patient back per Sarah below with response to med refill request.

## 2016-03-04 NOTE — Telephone Encounter (Signed)
Please let Megan [name] know that her CXR showed resolution of her pneumonia and and that her labs were continuing to improve. She will need to have follow up labs at her new PCP to ensure continuing improvement.Please see if there is a way to expedite getting her an appointment as she has many primary care needs. Thanks so much!

## 2016-03-04 NOTE — Telephone Encounter (Signed)
Spoke to [place] in primary care [facility] she will call pt and set her up with one of the  mds there Joellen Jersey

## 2016-03-04 NOTE — Telephone Encounter (Signed)
Please explain to the patient that because we are a pulmonary practice my supervising physician does not allow me to prescribe narcotics or anti anxiety medications except in special circumstances. Please tell her I am sorry.

## 2016-03-04 NOTE — Telephone Encounter (Signed)
Pt states that she received a phone call from [facility] stating that they cannot get the patient in for another month. Pt states that she needs the Xanax (for anxiety and HR) and Fiorcet (for migraines) refilled if at all possible by our office until she can be seen by Primary Care. Pt states that she did not get an appt scheduled with PCP d/t them wanting to schedule it so far out and she became upset when they told her they could not see her any sooner and definitely would not fill her meds. Pt requesting that Judson Roch fill these for her x 10montheven if a limited amount of #10 or #15 each. Pt states that she is completely out.  Pt aware that she needs to call back and get appt made with PCP. Please advise SJudson Roch thanks.

## 2016-03-05 DIAGNOSIS — E43 Unspecified severe protein-calorie malnutrition: Secondary | ICD-10-CM | POA: Diagnosis not present

## 2016-03-05 DIAGNOSIS — I10 Essential (primary) hypertension: Secondary | ICD-10-CM | POA: Diagnosis not present

## 2016-03-05 DIAGNOSIS — I509 Heart failure, unspecified: Secondary | ICD-10-CM | POA: Diagnosis not present

## 2016-03-05 DIAGNOSIS — F4310 Post-traumatic stress disorder, unspecified: Secondary | ICD-10-CM | POA: Diagnosis not present

## 2016-03-05 DIAGNOSIS — J441 Chronic obstructive pulmonary disease with (acute) exacerbation: Secondary | ICD-10-CM | POA: Diagnosis not present

## 2016-03-05 DIAGNOSIS — Z8701 Personal history of pneumonia (recurrent): Secondary | ICD-10-CM | POA: Diagnosis not present

## 2016-03-05 NOTE — Telephone Encounter (Signed)
tammy peace in primary care called this pt and she said she does not want to see anyone there she was going to see if she could she her old prim care dr i noted this in the referral Joellen Jersey

## 2016-03-05 NOTE — Telephone Encounter (Signed)
Spoke with pt. She is aware that we can't refill her medication at this time. Will route message to [facility] and see if they can get a sooner appointment with primary care.  [facility] - please advise. Thanks.

## 2016-03-05 NOTE — Telephone Encounter (Signed)
Noted. Nothing further needed. 

## 2016-03-09 ENCOUNTER — Telehealth: Payer: Self-pay | Admitting: Acute Care

## 2016-03-09 DIAGNOSIS — J449 Chronic obstructive pulmonary disease, unspecified: Secondary | ICD-10-CM

## 2016-03-09 NOTE — Telephone Encounter (Signed)
Patient calling because [facility] has not received order for O2.  Order has been placed. Patient aware. Nothing further needed.

## 2016-03-11 DIAGNOSIS — E43 Unspecified severe protein-calorie malnutrition: Secondary | ICD-10-CM | POA: Diagnosis not present

## 2016-03-11 DIAGNOSIS — I10 Essential (primary) hypertension: Secondary | ICD-10-CM | POA: Diagnosis not present

## 2016-03-11 DIAGNOSIS — I509 Heart failure, unspecified: Secondary | ICD-10-CM | POA: Diagnosis not present

## 2016-03-11 DIAGNOSIS — J441 Chronic obstructive pulmonary disease with (acute) exacerbation: Secondary | ICD-10-CM | POA: Diagnosis not present

## 2016-03-11 DIAGNOSIS — Z8701 Personal history of pneumonia (recurrent): Secondary | ICD-10-CM | POA: Diagnosis not present

## 2016-03-11 DIAGNOSIS — F4310 Post-traumatic stress disorder, unspecified: Secondary | ICD-10-CM | POA: Diagnosis not present

## 2016-03-16 ENCOUNTER — Telehealth: Payer: Self-pay | Admitting: Acute Care

## 2016-03-16 DIAGNOSIS — F411 Generalized anxiety disorder: Secondary | ICD-10-CM

## 2016-03-16 DIAGNOSIS — E43 Unspecified severe protein-calorie malnutrition: Secondary | ICD-10-CM

## 2016-03-16 NOTE — Telephone Encounter (Signed)
error 

## 2016-03-16 NOTE — Telephone Encounter (Signed)
Spoke with pt and she states that she was contacted by [facility] [place] to make appt but that it would have been into June and she does not feel that she can wait that long. Pt states that SG told her she needs to be "closely monitored" so she wants to be seen by [facility] asap. Advised pt that the had tried to schedule her earlier this month and she had declined, based on TE notes. Pt states that was "not what she meant" and that she would have gone back to her old PCP "if she had known it would take this long". I asked her could she not see her old PCP in the interim until she establishes care with new PCP and she said "no". She states that we did not follow through on her referral to PCP and that since SG said she needs to be closely monitored she feels that we should have gotten her an urgent appointment with [facility]. She also talks a lot about her family, and states that her daughter cannot wait until the end of June for pt to have an appt. She mentions her grandchildren a lot, and how sick they are, how she has a lot of anxiety and heart palpitations. She seems to have very scattered thoughts. She mentions that she has been ventilated and intubated in the past and that she feels she needs a lot of care and needs to be seen in a medical office frequently. She also mentions that she does need Xanax daily, but that she "is not one of those people you hear about on the news".   SG please advise as to placing a second PCP referral and if it can be marked urgent. Thanks!

## 2016-03-17 DIAGNOSIS — E43 Unspecified severe protein-calorie malnutrition: Secondary | ICD-10-CM | POA: Diagnosis not present

## 2016-03-17 DIAGNOSIS — I10 Essential (primary) hypertension: Secondary | ICD-10-CM | POA: Diagnosis not present

## 2016-03-17 DIAGNOSIS — I509 Heart failure, unspecified: Secondary | ICD-10-CM | POA: Diagnosis not present

## 2016-03-17 DIAGNOSIS — F4310 Post-traumatic stress disorder, unspecified: Secondary | ICD-10-CM | POA: Diagnosis not present

## 2016-03-17 DIAGNOSIS — Z8701 Personal history of pneumonia (recurrent): Secondary | ICD-10-CM | POA: Diagnosis not present

## 2016-03-17 DIAGNOSIS — J441 Chronic obstructive pulmonary disease with (acute) exacerbation: Secondary | ICD-10-CM | POA: Diagnosis not present

## 2016-03-17 NOTE — Telephone Encounter (Signed)
Please place a second PCP referral and mark it urgent. Thanks.

## 2016-03-17 NOTE — Telephone Encounter (Signed)
Referral placed to primary care.  Pt aware.  Nothing further needed.

## 2016-03-18 ENCOUNTER — Telehealth: Payer: Self-pay | Admitting: Emergency Medicine

## 2016-03-18 DIAGNOSIS — J438 Other emphysema: Secondary | ICD-10-CM

## 2016-03-18 NOTE — Telephone Encounter (Signed)
Per chart the right referral code was used ([id number]) in both referrals. Not sure what went wrong. LMTCB

## 2016-03-19 ENCOUNTER — Ambulatory Visit (INDEPENDENT_AMBULATORY_CARE_PROVIDER_SITE_OTHER): Payer: Medicare Other | Admitting: Family

## 2016-03-19 ENCOUNTER — Encounter: Payer: Self-pay | Admitting: Family

## 2016-03-19 DIAGNOSIS — F4312 Post-traumatic stress disorder, chronic: Secondary | ICD-10-CM | POA: Diagnosis not present

## 2016-03-19 DIAGNOSIS — R519 Headache, unspecified: Secondary | ICD-10-CM

## 2016-03-19 DIAGNOSIS — I2109 ST elevation (STEMI) myocardial infarction involving other coronary artery of anterior wall: Secondary | ICD-10-CM | POA: Diagnosis not present

## 2016-03-19 DIAGNOSIS — J449 Chronic obstructive pulmonary disease, unspecified: Secondary | ICD-10-CM | POA: Insufficient documentation

## 2016-03-19 DIAGNOSIS — E43 Unspecified severe protein-calorie malnutrition: Secondary | ICD-10-CM

## 2016-03-19 DIAGNOSIS — R51 Headache: Secondary | ICD-10-CM

## 2016-03-19 DIAGNOSIS — J439 Emphysema, unspecified: Secondary | ICD-10-CM

## 2016-03-19 DIAGNOSIS — I10 Essential (primary) hypertension: Secondary | ICD-10-CM

## 2016-03-19 MED ORDER — PAROXETINE HCL 20 MG PO TABS: 20.0000 mg | ORAL_TABLET | Freq: Every day | ORAL | Status: DC

## 2016-03-19 MED ORDER — BUTALBITAL-APAP-CAFFEINE 50-325-40 MG PO TABS: 1.0000 | ORAL_TABLET | Freq: Two times a day (BID) | ORAL | Status: DC | PRN

## 2016-03-19 MED ORDER — ALPRAZOLAM 0.25 MG PO TABS: 0.2500 mg | ORAL_TABLET | Freq: Two times a day (BID) | ORAL | Status: DC | PRN

## 2016-03-19 NOTE — Telephone Encounter (Signed)
Spoke with Thayer Headings at Pulmonary Rehab. Cloyde Reams was busy with pt's at the time of my call. She will have Molly call us back when she has a chance.

## 2016-03-19 NOTE — Patient Instructions (Signed)
Thank you for choosing [facility].  Summary/Instructions:  Please continue to take your medications as prescribed.   Start Paxil once daily.   Continue the Xanax as needed for anxiety.  Continue to [place] for headaches.   Your prescription(s) have been submitted to your pharmacy or been printed and provided for you. Please take as directed and contact our office if you believe you are having problem(s) with the medication(s) or have any questions.  If your symptoms worsen or fail to improve, please contact our office for further instruction, or in case of emergency go directly to the emergency room at the closest medical facility.

## 2016-03-19 NOTE — Assessment & Plan Note (Addendum)
COPD appears stable without exacerbation with current regimen. On home oxygen. Continue current dosage of fluticasone-solmeterol, ipratropium-albuterol and umeclidinium with changes and adjustment per Pulmonology. Appears deconditioned and Pulmonary is working on starting Pulmonary Rehabilitation which should benefit her functionality. Will continue to monitor.

## 2016-03-19 NOTE — Telephone Encounter (Signed)
Megan [name] aware that order has been placed again. Nothing further needed.

## 2016-03-19 NOTE — Telephone Encounter (Signed)
Molly cb, [phone] °

## 2016-03-19 NOTE — Assessment & Plan Note (Signed)
BMI of 15.59 most likely related to chronic disease and most recent hospitalization. Discussed importance of consuming a calorie and protein rich diet to maintain energy and muscle mass given her work of breathing. Continue frequent small meals and consider meal replacement shakes as affordable. Recommend attempting to gain weight with goal of at least 2-3 pounds in the next month.

## 2016-03-19 NOTE — Telephone Encounter (Signed)
Per Ria Comment, change dx to emphysema.  Order placed for Pulm rehab [facility] @ Pulm Rehab, LM to call back to make her aware that.

## 2016-03-19 NOTE — Assessment & Plan Note (Signed)
Symptoms consistent with a mixed headache between cluster and migraines. Frequency remains infrequent at this time and managed with Fioricet on an as needed basis. Continue current dosage of Fioricet. Will continue to follow.

## 2016-03-19 NOTE — Progress Notes (Signed)
Pre visit review using our clinic review tool, if applicable. No additional management support is needed unless otherwise documented below in the visit note. 

## 2016-03-19 NOTE — Telephone Encounter (Signed)
Megan [name], pulmonary rehab returned call.  There has been an order put in with diagnosis of COPD.  She needs a different order for pulmonary rehab because patient does not have PFT's. Can refer her for anything else other than COPD.  CB [phone].

## 2016-03-19 NOTE — Assessment & Plan Note (Signed)
Hypertension is well controlled without medication and remains below goal of 140/90. Continue with lifestyle management and continue to monitor blood pressure at home. Will continue to monitor.

## 2016-03-19 NOTE — Assessment & Plan Note (Signed)
PTSD from the loss of her son which appears stable with current dosage of Xanax as needed. Discussed and recommended starting a secondary agent to control her anxiety and depressive symptoms. Reviewed risks, side effects and proper medication usage. Start Paxil. Continue current dosage of Xanax as needed. Denies suicidal ideations. Follow up in 1 month or sooner if necessary.

## 2016-03-19 NOTE — Progress Notes (Signed)
Subjective:    Patient ID: Megan [name], female    DOB: [birth year], 65 y.o.   MRN: [id number]  Chief Complaint  Patient presents with  . Establish Care    would like to see about getting xanax and fioricet     HPI:  Megan [name] is a 65 y.o. female who  has a past medical history of Myocardial infarction [facility]); CHF (congestive heart failure) ([place]); COPD (chronic obstructive pulmonary disease) ([place]); Shortness of breath dyspnea; Depression; GERD (gastroesophageal reflux disease); History of hiatal hernia; Headache; Anemia; Essential hypertension (08/19/2015); Anxiety state (08/20/2015); and CAP (community acquired pneumonia). and presents today for an office visit to establish care.  1.) COPD Gold Stage IV - Currently maintained on fluticasone-salmeterol, ipratropium-albuterol, and Umeclidinium bromide. She is on continuous home oxygen and is managed by pulmonology. Reports taking her medications as prescribed without significant adverse side effects. Most recent pulmonary visit with concern for protein-calorie malnutrition secondary to previous hospitalization and deconditioning. Protein-calorie malnutrition also associated with her COPD. She was encouraged to take small frequent meals and Boost.   2.) Post-traumatic stress - Experiences anxiety and depression associated with the post-traumatic stress which she is currently managed with Xanax which she takes on as needed basis. PTSD stems from the loss of her son having witnessed him committ suicide. Denies adverse side effects. Notes that her symptoms are generally well controlled. She has not been on any other medications. Does endorse symptoms of depression on occasion and not currently on medication.   3.) Migraine headaches - Continues to experience the associated symptom of migraine headaches and possibly cluster headaches which are described with lightening and sharp with sensitivity to light and sounds with nausea and occasional vomiting.  Frequency of the headaches occur fairly infrequently and currently managed with the modifying factor of Fiorcet which does help with her symptoms.   4.) Hypertension - Blood pressure had been low in the past several weeks and her blood pressure medications were discontinued/held per Dr. Janace Aris of [facility]. Reports that her blood pressures at home have been below goal of 140/90. Denies symptoms of end organ damage.   Allergies  Allergen Reactions  . Penicillins Rash    Has patient had a PCN reaction causing immediate rash, facial/tongue/throat swelling, SOB or lightheadedness with hypotension: No Has patient had a PCN reaction causing severe rash involving mucus membranes or skin necrosis:NO Has patient had a PCN reaction that required hospitalization No Has patient had a PCN reaction occurring within the last 10 years:NO If all of the above answers are "NO", then may proceed with Cephalosporin use.     Outpatient Prescriptions Prior to Visit  Medication Sig Dispense Refill  . acetaminophen (TYLENOL) 500 MG tablet Take 500 mg by mouth every 6 (six) hours as needed for mild pain.    Marland Kitchen albuterol (PROVENTIL HFA;VENTOLIN HFA) 108 (90 BASE) MCG/ACT inhaler Inhale 1 puff into the lungs every 4 (four) hours as needed for wheezing or shortness of breath. 1 Inhaler 0  . aspirin 81 MG chewable tablet Chew 81 mg by mouth daily.    . busPIRone (BUSPAR) 7.5 MG tablet Take 2 tablets (15 mg total) by mouth 2 (two) times daily. 60 tablet 0  . docusate sodium (COLACE) 100 MG capsule Take 100 mg by mouth daily as needed for mild constipation.    . feeding supplement, ENSURE ENLIVE, (ENSURE ENLIVE) LIQD Take 237 mLs by mouth 3 (three) times daily between meals. 237 mL 12  . Fluticasone-Salmeterol (  ADVAIR) 500-50 MCG/DOSE AEPB Inhale 1 puff into the lungs 2 (two) times daily. 60 each 0  . ipratropium-albuterol (DUONEB) 0.5-2.5 (3) MG/3ML SOLN Take 3 mLs by nebulization every 6 (six) hours as needed  (shortness of breath).    . lidocaine (LIDODERM) 5 % Place 0.25 patches onto the skin daily as needed (pain). Remove & Discard patch within 12 hours or as directed by MD    . MELATONIN PO Take 1 tablet by mouth at bedtime as needed (sleep).    . Multiple Vitamin (MULTIVITAMIN WITH MINERALS) TABS tablet Take 1 tablet by mouth daily.    Vladimir Faster Glycol-Propyl Glycol (SYSTANE OP) Place 1 drop into both eyes at bedtime.    . promethazine (PHENERGAN) 12.5 MG tablet Take 12.5 mg by mouth every 6 (six) hours as needed for nausea or vomiting.    Marland Kitchen Umeclidinium Bromide (INCRUSE ELLIPTA) 62.5 MCG/INH AEPB Inhale 1 puff into the lungs daily. 30 each 0  . ALPRAZolam (XANAX) 0.25 MG tablet Take 1 tablet (0.25 mg total) by mouth 2 (two) times daily as needed for anxiety. 30 tablet 0  . amLODipine (NORVASC) 2.5 MG tablet Take 1 tablet (2.5 mg total) by mouth daily. 30 tablet 0  . butalbital-acetaminophen-caffeine (FIORICET, ESGIC) 50-325-40 MG tablet Take 1 tablet by mouth 2 (two) times daily as needed for headache. 14 tablet 0  . lisinopril (PRINIVIL,ZESTRIL) 20 MG tablet Take 1 tablet (20 mg total) by mouth daily. 30 tablet 0   No facility-administered medications prior to visit.     Past Medical History  Diagnosis Date  . Myocardial infarction ([place])   . CHF (congestive heart failure) ([place])   . COPD (chronic obstructive pulmonary disease) ([place])   . Shortness of breath dyspnea   . Depression   . GERD (gastroesophageal reflux disease)   . History of hiatal hernia   . Headache   . Anemia   . Essential hypertension 08/19/2015  . Anxiety state 08/20/2015  . CAP (community acquired pneumonia)      Past Surgical History  Procedure Laterality Date  . Cardiac catheterization    . Cardiac catheterization N/A 08/26/2015    Procedure: Left Heart Cath and Coronary Angiography;  Surgeon: Jettie Booze, MD;  Location: [facility];  Service: Cardiovascular;  Laterality: N/A;     Family  History  Problem Relation Age of Onset  . Cirrhosis Mother   . Depression Mother   . Heart disease Father      Social History   Social History  . Marital Status: Widowed    Spouse Name: N/A  . Number of Children: 3  . Years of Education: 12   Occupational History  . Disability    Social History Main Topics  . Smoking status: Former Smoker -- 1.00 packs/day for 30 years    Types: Cigarettes    Quit date: 08/18/2000  . Smokeless tobacco: Never Used  . Alcohol Use: No  . Drug Use: No  . Sexual Activity: No   Other Topics Concern  . Not on file   Social History Narrative   Denies abuse and feels safe at home.      Review of Systems  Constitutional: Negative for fever and chills.  Respiratory: Positive for wheezing. Negative for chest tightness and shortness of breath.   Cardiovascular: Negative for chest pain, palpitations and leg swelling.  Neurological: Positive for headaches. Negative for weakness.      Objective:    BP 120/78 mmHg  Pulse  114  Temp(Src) 98.6 F (37 C) (Oral)  Resp 16  Ht 5' 3.5" (1.613 m)  Wt 89 lb 6.4 oz (40.552 kg)  BMI 15.59 kg/m2  SpO2 95% Nursing note and vital signs reviewed.  Physical Exam  Constitutional: She is oriented to person, place, and time. She appears cachectic. No distress.  Seated in a wheelchair with oxygen via nasal cannula.  HENT:  Right Ear: Hearing, tympanic membrane, external ear and ear canal normal.  Left Ear: Hearing, tympanic membrane, external ear and ear canal normal.  Nose: Nose normal.  Mouth/Throat: Uvula is midline, oropharynx is clear and moist and mucous membranes are normal.  Cardiovascular: Normal rate, regular rhythm, normal heart sounds and intact distal pulses.   Pulmonary/Chest: Effort normal. No respiratory distress. She has wheezes. She has no rales. She exhibits no tenderness.  Neurological: She is alert and oriented to person, place, and time.  Skin: Skin is warm and dry.  Psychiatric:  She has a normal mood and affect. Her behavior is normal. Judgment and thought content normal.       Assessment & Plan:   Problem List Items Addressed This Visit      Cardiovascular and Mediastinum   Essential hypertension    Hypertension is well controlled without medication and remains below goal of 140/90. Continue with lifestyle management and continue to monitor blood pressure at home. Will continue to monitor.         Respiratory   COPD (chronic obstructive pulmonary disease) (HCC)    COPD appears stable without exacerbation with current regimen. On home oxygen. Continue current dosage of fluticasone-solmeterol, ipratropium-albuterol and umeclidinium with changes and adjustment per Pulmonology. Appears deconditioned and Pulmonary is working on starting Pulmonary Rehabilitation which should benefit her functionality. Will continue to monitor.         Other   Protein-calorie malnutrition, severe    BMI of 15.59 most likely related to chronic disease and most recent hospitalization. Discussed importance of consuming a calorie and protein rich diet to maintain energy and muscle mass given her work of breathing. Continue frequent small meals and consider meal replacement shakes as affordable. Recommend attempting to gain weight with goal of at least 2-3 pounds in the next month.      Chronic post-traumatic stress disorder (PTSD)    PTSD from the loss of her son which appears stable with current dosage of Xanax as needed. Discussed and recommended starting a secondary agent to control her anxiety and depressive symptoms. Reviewed risks, side effects and proper medication usage. Start Paxil. Continue current dosage of Xanax as needed. Denies suicidal ideations. Follow up in 1 month or sooner if necessary.       Relevant Medications   ALPRAZolam (XANAX) 0.25 MG tablet   PARoxetine (PAXIL) 20 MG tablet   Mixed headache    Symptoms consistent with a mixed headache between cluster and  migraines. Frequency remains infrequent at this time and managed with Fioricet on an as needed basis. Continue current dosage of Fioricet. Will continue to follow.       Relevant Medications   PARoxetine (PAXIL) 20 MG tablet   butalbital-acetaminophen-caffeine (FIORICET, ESGIC) 50-325-40 MG tablet       I have discontinued Ms. Petrosky's lisinopril and amLODipine. I am also having her start on PARoxetine. Additionally, I am having her maintain her acetaminophen, umeclidinium bromide, albuterol, Fluticasone-Salmeterol, aspirin, promethazine, ipratropium-albuterol, lidocaine, multivitamin with minerals, busPIRone, docusate sodium, MELATONIN PO, Polyethyl Glycol-Propyl Glycol (SYSTANE OP), feeding supplement (ENSURE ENLIVE), ALPRAZolam, and  butalbital-acetaminophen-caffeine.   Meds ordered this encounter  Medications  . ALPRAZolam (XANAX) 0.25 MG tablet    Sig: Take 1 tablet (0.25 mg total) by mouth 2 (two) times daily as needed for anxiety.    Dispense:  60 tablet    Refill:  0    Order Specific Question:  Supervising Provider    Answer:  Pricilla Holm A [[id number]]  . PARoxetine (PAXIL) 20 MG tablet    Sig: Take 1 tablet (20 mg total) by mouth daily.    Dispense:  30 tablet    Refill:  0    Order Specific Question:  Supervising Provider    Answer:  Pricilla Holm A [[id number]]  . butalbital-acetaminophen-caffeine (FIORICET, ESGIC) 50-325-40 MG tablet    Sig: Take 1 tablet by mouth 2 (two) times daily as needed for headache.    Dispense:  20 tablet    Refill:  0    Order Specific Question:  Supervising Provider    Answer:  Pricilla Holm A [[id number]]     Follow-up: Return in about 1 month (around 04/19/2016).  Mauricio Po, FNP

## 2016-03-23 ENCOUNTER — Telehealth: Payer: Self-pay | Admitting: Acute Care

## 2016-03-23 MED ORDER — PREDNISONE 10 MG PO TABS: ORAL_TABLET | ORAL | Status: DC

## 2016-03-23 NOTE — Telephone Encounter (Signed)
Pt was seen by SG on 02/25/16 as a HFU.  Pt states she finished pred taper a few days ago.  She c/o increased SOB, chest tightness, and chest congestion with clear to cloudy mucus since completing taper.  Cough is at baseline.  Denies f/c/s.  Pt is using albuterol hfa and duoneb with short term relief.  Offered OV - pt declined and is requesting "few more days of prednisone to extend the taper."  As RB is off, will send to SG.  Please advise.  Thank you!  [place]

## 2016-03-23 NOTE — Telephone Encounter (Signed)
Called, spoke with pt.  Discussed below per SG.  Pt verbalized understanding and is in agreement with plan. Rx sent to [facility].  Pt aware and is to call back if symptoms do not improve or worsen and seek emergency care if needed.

## 2016-03-23 NOTE — Telephone Encounter (Signed)
Please order Prednisone taper; 10 mg tablets: 4 tabs x 2 days, 3 tabs x 2 days, 2 tabs x 2 days 1 tab x 2 days then stop. Tell her if she does not improve with this she MUST come in and be seen. Thanks

## 2016-03-24 DIAGNOSIS — F4310 Post-traumatic stress disorder, unspecified: Secondary | ICD-10-CM | POA: Diagnosis not present

## 2016-03-24 DIAGNOSIS — I509 Heart failure, unspecified: Secondary | ICD-10-CM | POA: Diagnosis not present

## 2016-03-24 DIAGNOSIS — E43 Unspecified severe protein-calorie malnutrition: Secondary | ICD-10-CM | POA: Diagnosis not present

## 2016-03-24 DIAGNOSIS — Z8701 Personal history of pneumonia (recurrent): Secondary | ICD-10-CM | POA: Diagnosis not present

## 2016-03-24 DIAGNOSIS — I10 Essential (primary) hypertension: Secondary | ICD-10-CM | POA: Diagnosis not present

## 2016-03-24 DIAGNOSIS — J441 Chronic obstructive pulmonary disease with (acute) exacerbation: Secondary | ICD-10-CM | POA: Diagnosis not present

## 2016-03-31 ENCOUNTER — Telehealth: Payer: Self-pay | Admitting: Emergency Medicine

## 2016-03-31 DIAGNOSIS — F4310 Post-traumatic stress disorder, unspecified: Secondary | ICD-10-CM | POA: Diagnosis not present

## 2016-03-31 DIAGNOSIS — E43 Unspecified severe protein-calorie malnutrition: Secondary | ICD-10-CM | POA: Diagnosis not present

## 2016-03-31 DIAGNOSIS — J441 Chronic obstructive pulmonary disease with (acute) exacerbation: Secondary | ICD-10-CM | POA: Diagnosis not present

## 2016-03-31 DIAGNOSIS — I509 Heart failure, unspecified: Secondary | ICD-10-CM | POA: Diagnosis not present

## 2016-03-31 DIAGNOSIS — I10 Essential (primary) hypertension: Secondary | ICD-10-CM | POA: Diagnosis not present

## 2016-03-31 DIAGNOSIS — Z8701 Personal history of pneumonia (recurrent): Secondary | ICD-10-CM | POA: Diagnosis not present

## 2016-03-31 MED ORDER — DOXYCYCLINE HYCLATE 100 MG PO TABS: 100.0000 mg | ORAL_TABLET | Freq: Two times a day (BID) | ORAL | Status: DC

## 2016-03-31 NOTE — Telephone Encounter (Signed)
Please have her start doxycycline 100 mg twice a day for 7 days. If she is not improving next 48 hours then she needs an office visit or to be seen in the emergency department.

## 2016-03-31 NOTE — Telephone Encounter (Signed)
Spoke with pt. She is aware of RB's recommendation. Rx has been sent in. Nothing further was needed.

## 2016-03-31 NOTE — Telephone Encounter (Signed)
Spoke with Olivia Mackie with [facility]. States that the pt is complaining of coughing with production of yellow mucus. Denies chest tightness, wheezing, fever or SOB. All vitals are good >> oxygen sats are good on 2L/min. Cough started last week and PCP put her on pred taper, she has 5 days left of this. Pt is wondering if she need an antibiotic.  RB - please advise. Thanks.

## 2016-04-08 DIAGNOSIS — I10 Essential (primary) hypertension: Secondary | ICD-10-CM | POA: Diagnosis not present

## 2016-04-08 DIAGNOSIS — J441 Chronic obstructive pulmonary disease with (acute) exacerbation: Secondary | ICD-10-CM | POA: Diagnosis not present

## 2016-04-08 DIAGNOSIS — F4310 Post-traumatic stress disorder, unspecified: Secondary | ICD-10-CM | POA: Diagnosis not present

## 2016-04-08 DIAGNOSIS — Z8701 Personal history of pneumonia (recurrent): Secondary | ICD-10-CM | POA: Diagnosis not present

## 2016-04-08 DIAGNOSIS — E43 Unspecified severe protein-calorie malnutrition: Secondary | ICD-10-CM | POA: Diagnosis not present

## 2016-04-08 DIAGNOSIS — I509 Heart failure, unspecified: Secondary | ICD-10-CM | POA: Diagnosis not present

## 2016-04-14 DIAGNOSIS — Z8701 Personal history of pneumonia (recurrent): Secondary | ICD-10-CM | POA: Diagnosis not present

## 2016-04-14 DIAGNOSIS — E43 Unspecified severe protein-calorie malnutrition: Secondary | ICD-10-CM | POA: Diagnosis not present

## 2016-04-14 DIAGNOSIS — I10 Essential (primary) hypertension: Secondary | ICD-10-CM | POA: Diagnosis not present

## 2016-04-14 DIAGNOSIS — J441 Chronic obstructive pulmonary disease with (acute) exacerbation: Secondary | ICD-10-CM | POA: Diagnosis not present

## 2016-04-14 DIAGNOSIS — F4310 Post-traumatic stress disorder, unspecified: Secondary | ICD-10-CM | POA: Diagnosis not present

## 2016-04-14 DIAGNOSIS — I509 Heart failure, unspecified: Secondary | ICD-10-CM | POA: Diagnosis not present

## 2016-04-16 ENCOUNTER — Telehealth: Payer: Self-pay | Admitting: Acute Care

## 2016-04-16 NOTE — Telephone Encounter (Signed)
lmtcb x1 for pt. 

## 2016-04-19 ENCOUNTER — Other Ambulatory Visit: Payer: Self-pay | Admitting: Emergency Medicine

## 2016-04-19 MED ORDER — IPRATROPIUM-ALBUTEROL 0.5-2.5 (3) MG/3ML IN SOLN: 3.0000 mL | Freq: Four times a day (QID) | RESPIRATORY_TRACT | Status: DC | PRN

## 2016-04-19 MED ORDER — ALBUTEROL SULFATE HFA 108 (90 BASE) MCG/ACT IN AERS: 1.0000 | INHALATION_SPRAY | RESPIRATORY_TRACT | Status: DC | PRN

## 2016-04-19 MED ORDER — UMECLIDINIUM BROMIDE 62.5 MCG/INH IN AEPB: 1.0000 | INHALATION_SPRAY | Freq: Every day | RESPIRATORY_TRACT | Status: DC

## 2016-04-19 MED ORDER — FLUTICASONE-SALMETEROL 500-50 MCG/DOSE IN AEPB: 1.0000 | INHALATION_SPRAY | Freq: Two times a day (BID) | RESPIRATORY_TRACT | Status: DC

## 2016-04-19 NOTE — Telephone Encounter (Signed)
Requesting refills of all of her Duoneb, Advair, Incruse and Albuterol.  Duoneb has never been prescribed by our office. Please advise Dr Lamonte Sakai if okay to start filling this.  Thanks.  [facility] [place]

## 2016-04-19 NOTE — Telephone Encounter (Signed)
lmtcb for pt.  

## 2016-04-19 NOTE — Telephone Encounter (Signed)
Called and spoke with pt. I informed her that all prescriptions have been sent to the pharmacy. She voiced understanding and had no further questions.

## 2016-04-19 NOTE — Telephone Encounter (Signed)
Pt returning call and can be reached @ [phone].Megan [name]

## 2016-04-19 NOTE — Telephone Encounter (Signed)
Patient returning call- she can be reached at [phone]

## 2016-04-19 NOTE — Telephone Encounter (Signed)
This is OK. duoneb q6h prn for sob

## 2016-04-20 ENCOUNTER — Ambulatory Visit: Payer: Medicare Other | Admitting: Family

## 2016-04-21 DIAGNOSIS — I509 Heart failure, unspecified: Secondary | ICD-10-CM | POA: Diagnosis not present

## 2016-04-21 DIAGNOSIS — J441 Chronic obstructive pulmonary disease with (acute) exacerbation: Secondary | ICD-10-CM | POA: Diagnosis not present

## 2016-04-21 DIAGNOSIS — F4310 Post-traumatic stress disorder, unspecified: Secondary | ICD-10-CM | POA: Diagnosis not present

## 2016-04-21 DIAGNOSIS — I10 Essential (primary) hypertension: Secondary | ICD-10-CM | POA: Diagnosis not present

## 2016-04-21 DIAGNOSIS — Z8701 Personal history of pneumonia (recurrent): Secondary | ICD-10-CM | POA: Diagnosis not present

## 2016-04-21 DIAGNOSIS — E43 Unspecified severe protein-calorie malnutrition: Secondary | ICD-10-CM | POA: Diagnosis not present

## 2016-04-22 DIAGNOSIS — H353190 Nonexudative age-related macular degeneration, unspecified eye, stage unspecified: Secondary | ICD-10-CM | POA: Diagnosis not present

## 2016-04-22 DIAGNOSIS — H401131 Primary open-angle glaucoma, bilateral, mild stage: Secondary | ICD-10-CM | POA: Diagnosis not present

## 2016-04-22 DIAGNOSIS — H25813 Combined forms of age-related cataract, bilateral: Secondary | ICD-10-CM | POA: Diagnosis not present

## 2016-05-11 ENCOUNTER — Ambulatory Visit: Payer: Medicare Other | Admitting: Family

## 2016-05-14 ENCOUNTER — Ambulatory Visit: Payer: Medicare Other | Admitting: Family

## 2016-05-19 ENCOUNTER — Encounter: Payer: Self-pay | Admitting: Family

## 2016-05-19 ENCOUNTER — Ambulatory Visit (INDEPENDENT_AMBULATORY_CARE_PROVIDER_SITE_OTHER): Payer: Medicare Other | Admitting: Family

## 2016-05-19 VITALS — BP 110/78 | HR 90 | Temp 98.1°F | Resp 18 | Ht 63.5 in | Wt 93.0 lb

## 2016-05-19 DIAGNOSIS — I2109 ST elevation (STEMI) myocardial infarction involving other coronary artery of anterior wall: Secondary | ICD-10-CM

## 2016-05-19 DIAGNOSIS — I10 Essential (primary) hypertension: Secondary | ICD-10-CM | POA: Diagnosis not present

## 2016-05-19 DIAGNOSIS — Z01818 Encounter for other preprocedural examination: Secondary | ICD-10-CM

## 2016-05-19 DIAGNOSIS — E43 Unspecified severe protein-calorie malnutrition: Secondary | ICD-10-CM | POA: Diagnosis not present

## 2016-05-19 DIAGNOSIS — J439 Emphysema, unspecified: Secondary | ICD-10-CM

## 2016-05-19 NOTE — Progress Notes (Signed)
Subjective:    Patient ID: Megan [name], female    DOB: [birth year], 65 y.o.   MRN: [id number]  Chief Complaint  Patient presents with  . Follow-up    need clearance for cataract surgery    HPI:  Megan [name] is a 65 y.o. female who  has a past medical history of Myocardial infarction [facility]); CHF (congestive heart failure) ([place]); COPD (chronic obstructive pulmonary disease) ([place]); Shortness of breath dyspnea; Depression; GERD (gastroesophageal reflux disease); History of hiatal hernia; Headache; Anemia; Essential hypertension (08/19/2015); Anxiety state (08/20/2015); and CAP (community acquired pneumonia). and presents today for an office visit.  Patient is scheduled to undergo cataract surgery and presents today for surgical clearnance. She will be undergoing conscious sedation during the procedure.   Allergies  Allergen Reactions  . Penicillins Rash    Has patient had a PCN reaction causing immediate rash, facial/tongue/throat swelling, SOB or lightheadedness with hypotension: No Has patient had a PCN reaction causing severe rash involving mucus membranes or skin necrosis:NO Has patient had a PCN reaction that required hospitalization No Has patient had a PCN reaction occurring within the last 10 years:NO If all of the above answers are "NO", then may proceed with Cephalosporin use.     Outpatient Prescriptions Prior to Visit  Medication Sig Dispense Refill  . acetaminophen (TYLENOL) 500 MG tablet Take 500 mg by mouth every 6 (six) hours as needed for mild pain.    Marland Kitchen albuterol (PROVENTIL HFA;VENTOLIN HFA) 108 (90 Base) MCG/ACT inhaler Inhale 1 puff into the lungs every 4 (four) hours as needed for wheezing or shortness of breath. 1 Inhaler 3  . ALPRAZolam (XANAX) 0.25 MG tablet Take 1 tablet (0.25 mg total) by mouth 2 (two) times daily as needed for anxiety. 60 tablet 0  . aspirin 81 MG chewable tablet Chew 81 mg by mouth daily.    . busPIRone (BUSPAR) 7.5 MG tablet Take 2 tablets  (15 mg total) by mouth 2 (two) times daily. 60 tablet 0  . butalbital-acetaminophen-caffeine (FIORICET, ESGIC) 50-325-40 MG tablet Take 1 tablet by mouth 2 (two) times daily as needed for headache. 20 tablet 0  . docusate sodium (COLACE) 100 MG capsule Take 100 mg by mouth daily as needed for mild constipation.    . feeding supplement, ENSURE ENLIVE, (ENSURE ENLIVE) LIQD Take 237 mLs by mouth 3 (three) times daily between meals. 237 mL 12  . Fluticasone-Salmeterol (ADVAIR) 500-50 MCG/DOSE AEPB Inhale 1 puff into the lungs 2 (two) times daily. 60 each 3  . ipratropium-albuterol (DUONEB) 0.5-2.5 (3) MG/3ML SOLN Take 3 mLs by nebulization every 6 (six) hours as needed (shortness of breath). 360 mL 3  . lidocaine (LIDODERM) 5 % Place 0.25 patches onto the skin daily as needed (pain). Remove & Discard patch within 12 hours or as directed by MD    . MELATONIN PO Take 1 tablet by mouth at bedtime as needed (sleep).    . Multiple Vitamin (MULTIVITAMIN WITH MINERALS) TABS tablet Take 1 tablet by mouth daily.    Vladimir Faster Glycol-Propyl Glycol (SYSTANE OP) Place 1 drop into both eyes at bedtime. Reported on 05/19/2016    . umeclidinium bromide (INCRUSE ELLIPTA) 62.5 MCG/INH AEPB Inhale 1 puff into the lungs daily. 30 each 5  . doxycycline (VIBRA-TABS) 100 MG tablet Take 1 tablet (100 mg total) by mouth 2 (two) times daily. (Patient not taking: Reported on 05/19/2016) 14 tablet 0  . PARoxetine (PAXIL) 20 MG tablet Take 1 tablet (20 mg total) by  mouth daily. (Patient not taking: Reported on 05/19/2016) 30 tablet 0  . predniSONE (DELTASONE) 10 MG tablet 4 tabs x 2 days, 3 tabs x 2 days, 2 tabs x 2 days 1 tab x 2 days then stop (Patient not taking: Reported on 05/19/2016) 20 tablet 0  . promethazine (PHENERGAN) 12.5 MG tablet Take 12.5 mg by mouth every 6 (six) hours as needed for nausea or vomiting. Reported on 05/19/2016     No facility-administered medications prior to visit.     Past Medical History    Diagnosis Date  . Myocardial infarction (Megan [name])   . CHF (congestive heart failure) (Megan [name])   . COPD (chronic obstructive pulmonary disease) ([place])   . Shortness of breath dyspnea   . Depression   . GERD (gastroesophageal reflux disease)   . History of hiatal hernia   . Headache   . Anemia   . Essential hypertension 08/19/2015  . Anxiety state 08/20/2015  . CAP (community acquired pneumonia)      Past Surgical History  Procedure Laterality Date  . Cardiac catheterization    . Cardiac catheterization N/A 08/26/2015    Procedure: Left Heart Cath and Coronary Angiography;  Surgeon: Jettie Booze, MD;  Location: [facility];  Service: Cardiovascular;  Laterality: N/A;     Family History  Problem Relation Age of Onset  . Cirrhosis Mother   . Depression Mother   . Heart disease Father      Social History   Social History  . Marital Status: Widowed    Spouse Name: N/A  . Number of Children: 3  . Years of Education: 12   Occupational History  . Disability    Social History Main Topics  . Smoking status: Former Smoker -- 1.00 packs/day for 30 years    Types: Cigarettes    Quit date: 08/18/2000  . Smokeless tobacco: Never Used  . Alcohol Use: No  . Drug Use: No  . Sexual Activity: No   Other Topics Concern  . Not on file   Social History Narrative   Denies abuse and feels safe at home.       Review of Systems    Constitutional: Denies fever, chills, fatigue, or significant weight gain/loss. HENT: Head: Denies headache or neck pain Ears: Denies changes in hearing, ringing in ears, earache, drainage Nose: Denies discharge, stuffiness, itching, nosebleed, sinus pain Throat: Denies sore throat, hoarseness, dry mouth, sores, thrush Eyes: Denies loss/changes in vision, pain, redness, blurry/double vision, flashing lights Cardiovascular: Denies chest pain/discomfort, tightness, palpitations, shortness of breath with activity, difficulty lying down,  swelling, sudden awakening with shortness of breath  Respiratory: Denies shortness of breath, cough, sputum production, wheezing Occasional shortness of breath and cough.  Gastrointestinal: Denies dysphasia, heartburn, change in appetite, nausea, change in bowel habits, rectal bleeding, constipation, diarrhea, yellow skin or eyes Genitourinary: Denies frequency, urgency, burning/pain, blood in urine, incontinence, change in urinary strength. Musculoskeletal: Denies muscle/joint pain, stiffness, back pain, redness or swelling of joints, trauma Skin: Denies rashes, lumps, itching, dryness, color changes, or hair/nail changes Neurological: Denies dizziness, fainting, seizures, weakness, numbness, tingling, tremor  Psychiatric - Denies nervousness, stress, depression or memory loss Endocrine: Denies heat or cold intolerance, sweating, frequent urination, excessive thirst, changes in appetite Hematologic: Denies ease of bruising or bleeding  Objective:    BP 110/78 mmHg  Pulse 90  Temp(Src) 98.1 F (36.7 C) (Oral)  Resp 18  Ht 5' 3.5" (1.613 m)  Wt 93 lb (42.185 kg)  BMI  16.21 kg/m2  SpO2 95% Nursing note and vital signs reviewed.   Physical Exam  Constitutional: She is oriented to person, place, and time. She appears well-developed and well-nourished. No distress.  Seated in the chair on 2 L via [place].   HENT:  Head: Normocephalic.  Right Ear: Hearing, tympanic membrane, external ear and ear canal normal.  Left Ear: Hearing, tympanic membrane, external ear and ear canal normal.  Nose: Nose normal.  Mouth/Throat: Uvula is midline, oropharynx is clear and moist and mucous membranes are normal.  Eyes: Conjunctivae and EOM are normal. Pupils are equal, round, and reactive to light.  Neck: Neck supple. No JVD present. No tracheal deviation present. No thyromegaly present.  Cardiovascular: Normal rate, regular rhythm, normal heart sounds and intact distal pulses.   Pulmonary/Chest: Effort  normal and breath sounds normal.  Abdominal: Soft. Bowel sounds are normal. She exhibits no distension and no mass. There is no tenderness. There is no rebound and no guarding.  Musculoskeletal: Normal range of motion. She exhibits no edema or tenderness.  Lymphadenopathy:    She has no cervical adenopathy.  Neurological: She is alert and oriented to person, place, and time. She has normal reflexes. No cranial nerve deficit. She exhibits normal muscle tone. Coordination normal.  Skin: Skin is warm and dry.  Psychiatric: She has a normal mood and affect. Her behavior is normal. Judgment and thought content normal.       Assessment & Plan:   Problem List Items Addressed This Visit      Cardiovascular and Mediastinum   Essential hypertension    Blood pressure is below goal 140/90 and maintained with current regimen with no adverse side effects.        Respiratory   COPD (chronic obstructive pulmonary disease) (HCC)    COPD appears stable with current regimen and maintained on 2 L of oxygen via nasal cannula. Patient is oxygenating well with no evidence of exacerbation and good oxygen saturations. Continue current dosage of albuterol, Advair, and DuoNeb.        Other   Protein-calorie malnutrition, severe   Pre-operative clearance - Primary    Patient medical, surgical, and family history reviewed with several comorbid conditions that appears stable with no exacerbations. Previous blood work reviewed with no significant irregularities. Previous EKG showed sinus tachycardia. Based on this information, patient is stable to undergo cataract surgery. Paperwork will be completed and faxed to Dr. Dannielle Burn office          I am having Ms. Behney maintain her acetaminophen, aspirin, promethazine, lidocaine, multivitamin with minerals, busPIRone, docusate sodium, MELATONIN PO, Polyethyl Glycol-Propyl Glycol (SYSTANE OP), feeding supplement (ENSURE ENLIVE), ALPRAZolam, PARoxetine,  butalbital-acetaminophen-caffeine, predniSONE, doxycycline, Fluticasone-Salmeterol, albuterol, ipratropium-albuterol, and umeclidinium bromide.  Follow-up: Return if symptoms worsen or fail to improve.  Mauricio Po, FNP  [facility] - Dr. Arlina Robes

## 2016-05-19 NOTE — Assessment & Plan Note (Signed)
COPD appears stable with current regimen and maintained on 2 L of oxygen via nasal cannula. Patient is oxygenating well with no evidence of exacerbation and good oxygen saturations. Continue current dosage of albuterol, Advair, and DuoNeb.

## 2016-05-19 NOTE — Assessment & Plan Note (Signed)
Patient medical, surgical, and family history reviewed with several comorbid conditions that appears stable with no exacerbations. Previous blood work reviewed with no significant irregularities. Previous EKG showed sinus tachycardia. Based on this information, patient is stable to undergo cataract surgery. Paperwork will be completed and faxed to Dr. Dannielle Burn office

## 2016-05-19 NOTE — Patient Instructions (Signed)
Thank you for choosing [facility].  Summary/Instructions:  You are cleared for surgery.   Good luck with surgery.  We will fax the information to Dr. Arlina Robes.  Continue to take your medications as prescribed.  If your symptoms worsen or fail to improve, please contact our office for further instruction, or in case of emergency go directly to the emergency room at the closest medical facility.

## 2016-05-19 NOTE — Progress Notes (Signed)
Pre visit review using our clinic review tool, if applicable. No additional management support is needed unless otherwise documented below in the visit note. 

## 2016-05-19 NOTE — Assessment & Plan Note (Signed)
Blood pressure is below goal 140/90 and maintained with current regimen with no adverse side effects.

## 2016-06-22 ENCOUNTER — Encounter: Payer: Self-pay | Admitting: Family

## 2016-06-22 ENCOUNTER — Ambulatory Visit (INDEPENDENT_AMBULATORY_CARE_PROVIDER_SITE_OTHER): Payer: Medicare Other | Admitting: Family

## 2016-06-22 ENCOUNTER — Other Ambulatory Visit (INDEPENDENT_AMBULATORY_CARE_PROVIDER_SITE_OTHER): Payer: Medicare Other

## 2016-06-22 VITALS — BP 134/70 | HR 94 | Temp 98.1°F | Resp 18 | Ht 63.5 in | Wt 94.0 lb

## 2016-06-22 DIAGNOSIS — E43 Unspecified severe protein-calorie malnutrition: Secondary | ICD-10-CM | POA: Diagnosis not present

## 2016-06-22 DIAGNOSIS — R21 Rash and other nonspecific skin eruption: Secondary | ICD-10-CM

## 2016-06-22 DIAGNOSIS — L659 Nonscarring hair loss, unspecified: Secondary | ICD-10-CM | POA: Insufficient documentation

## 2016-06-22 DIAGNOSIS — I2109 ST elevation (STEMI) myocardial infarction involving other coronary artery of anterior wall: Secondary | ICD-10-CM | POA: Diagnosis not present

## 2016-06-22 LAB — ALBUMIN: ALBUMIN: 4.3 g/dL (ref 3.5–5.2)

## 2016-06-22 LAB — TSH: TSH: 0.94 u[IU]/mL (ref 0.35–4.50)

## 2016-06-22 MED ORDER — TRIAMCINOLONE ACETONIDE 0.1 % EX CREA: 1.0000 "application " | TOPICAL_CREAM | Freq: Two times a day (BID) | CUTANEOUS | 0 refills | Status: DC

## 2016-06-22 NOTE — Patient Instructions (Addendum)
Thank you for choosing [facility].  Summary/Instructions:  Start taking Biotin.   Please ensure you are eating enough protein. I would like you to aim for a minimal of 50 grams daily.   Use MyFitnessPal to track your caloric intake.   http://carter.biz/ for nutrition recommendations.  Ensure/Boost/Boost Breeze  Your prescription(s) have been submitted to your pharmacy or been printed and provided for you. Please take as directed and contact our office if you believe you are having problem(s) with the medication(s) or have any questions.  Please stop by the lab on the lower level of the building for your blood work. Your results will be released to [place] (or called to you) after review, usually within 72 hours after test completion. If any changes need to be made, you will be notified at that same time.  1. The lab is open from 7:30am to 5:30 pm Monday-Friday  2. No appointment is necessary  3. Fasting (if needed) is 6-8 hours after food and drink; black  coffee and water are okay   If your symptoms worsen or fail to improve, please contact our office for further instruction, or in case of emergency go directly to the emergency room at the closest medical facility.

## 2016-06-22 NOTE — Assessment & Plan Note (Signed)
Increasing hair loss loss with concern for possible vitamin or nutritional deficiency. Start biotin. Obtain TSH and albumin. Recommend increasing intake of protein with significant history of protein calorie malnutrition which is most likely contributing to her current status. Ensure haircare products are not drying out hair. Follow-up pending blood work and trial of nutritional intervention.

## 2016-06-22 NOTE — Progress Notes (Signed)
Subjective:    Patient ID: Megan [name], female    DOB: [birth year], 65 y.o.   MRN: [id number]  Chief Complaint  Patient presents with  . Elbow Pain    having elbow pain hurts to touch like she is hitting a nerve, having hair fall out alot by the hand fulls, noticed the hair falling out over the last week,     HPI:  Megan [name] is a 65 y.o. female who  has a past medical history of Anemia; Anxiety state (08/20/2015); CAP (community acquired pneumonia); CHF (congestive heart failure) ([place]); COPD (chronic obstructive pulmonary disease) ([place]); Depression; Essential hypertension (08/19/2015); GERD (gastroesophageal reflux disease); Headache; History of hiatal hernia; Myocardial infarction ([place]); and Shortness of breath dyspnea. and presents today for an office visit.   1.) Hair loss - This is a new problem. Associated symptom of hair loss has been increased and going on for about 1-2 weeks. Notes her hair appears to be coming out in larger amounts. There are no modifying factors that make it better or worse.   2.) Elbow pain - Associated symptom of pain located in her right elbow behind the olecranon process and described as sharp pain that is generally constant. Denies trauma or injury. Modifying factors include OTC medications which have not helped very much. Has also tried lotions and moisturizes. No numbness and tingling in the distal extremity. No previous history of elbow injury.     Allergies  Allergen Reactions  . Penicillins Rash    Has patient had a PCN reaction causing immediate rash, facial/tongue/throat swelling, SOB or lightheadedness with hypotension: No Has patient had a PCN reaction causing severe rash involving mucus membranes or skin necrosis:NO Has patient had a PCN reaction that required hospitalization No Has patient had a PCN reaction occurring within the last 10 years:NO If all of the above answers are "NO", then may proceed with Cephalosporin use.     Current  Outpatient Prescriptions on File Prior to Visit  Medication Sig Dispense Refill  . acetaminophen (TYLENOL) 500 MG tablet Take 500 mg by mouth every 6 (six) hours as needed for mild pain.    Marland Kitchen albuterol (PROVENTIL HFA;VENTOLIN HFA) 108 (90 Base) MCG/ACT inhaler Inhale 1 puff into the lungs every 4 (four) hours as needed for wheezing or shortness of breath. 1 Inhaler 3  . ALPRAZolam (XANAX) 0.25 MG tablet Take 1 tablet (0.25 mg total) by mouth 2 (two) times daily as needed for anxiety. 60 tablet 0  . aspirin 81 MG chewable tablet Chew 81 mg by mouth daily.    . busPIRone (BUSPAR) 7.5 MG tablet Take 2 tablets (15 mg total) by mouth 2 (two) times daily. 60 tablet 0  . butalbital-acetaminophen-caffeine (FIORICET, ESGIC) 50-325-40 MG tablet Take 1 tablet by mouth 2 (two) times daily as needed for headache. 20 tablet 0  . docusate sodium (COLACE) 100 MG capsule Take 100 mg by mouth daily as needed for mild constipation.    Marland Kitchen doxycycline (VIBRA-TABS) 100 MG tablet Take 1 tablet (100 mg total) by mouth 2 (two) times daily. (Patient not taking: Reported on 05/19/2016) 14 tablet 0  . feeding supplement, ENSURE ENLIVE, (ENSURE ENLIVE) LIQD Take 237 mLs by mouth 3 (three) times daily between meals. 237 mL 12  . Fluticasone-Salmeterol (ADVAIR) 500-50 MCG/DOSE AEPB Inhale 1 puff into the lungs 2 (two) times daily. 60 each 3  . ipratropium-albuterol (DUONEB) 0.5-2.5 (3) MG/3ML SOLN Take 3 mLs by nebulization every 6 (six) hours as needed (shortness of  breath). 360 mL 3  . lidocaine (LIDODERM) 5 % Place 0.25 patches onto the skin daily as needed (pain). Remove & Discard patch within 12 hours or as directed by MD    . MELATONIN PO Take 1 tablet by mouth at bedtime as needed (sleep).    . Multiple Vitamin (MULTIVITAMIN WITH MINERALS) TABS tablet Take 1 tablet by mouth daily.    Marland Kitchen PARoxetine (PAXIL) 20 MG tablet Take 1 tablet (20 mg total) by mouth daily. (Patient not taking: Reported on 05/19/2016) 30 tablet 0  .  Polyethyl Glycol-Propyl Glycol (SYSTANE OP) Place 1 drop into both eyes at bedtime. Reported on 05/19/2016    . predniSONE (DELTASONE) 10 MG tablet 4 tabs x 2 days, 3 tabs x 2 days, 2 tabs x 2 days 1 tab x 2 days then stop (Patient not taking: Reported on 05/19/2016) 20 tablet 0  . promethazine (PHENERGAN) 12.5 MG tablet Take 12.5 mg by mouth every 6 (six) hours as needed for nausea or vomiting. Reported on 05/19/2016    . umeclidinium bromide (INCRUSE ELLIPTA) 62.5 MCG/INH AEPB Inhale 1 puff into the lungs daily. 30 each 5   No current facility-administered medications on file prior to visit.      Past Surgical History:  Procedure Laterality Date  . CARDIAC CATHETERIZATION    . CARDIAC CATHETERIZATION N/A 08/26/2015   Procedure: Left Heart Cath and Coronary Angiography;  Surgeon: Jettie Booze, MD;  Location: [facility];  Service: Cardiovascular;  Laterality: N/A;    Past Medical History:  Diagnosis Date  . Anemia   . Anxiety state 08/20/2015  . CAP (community acquired pneumonia)   . CHF (congestive heart failure) ([place])   . COPD (chronic obstructive pulmonary disease) ([place])   . Depression   . Essential hypertension 08/19/2015  . GERD (gastroesophageal reflux disease)   . Headache   . History of hiatal hernia   . Myocardial infarction ([place])   . Shortness of breath dyspnea      Review of Systems  Constitutional: Negative for chills and fever.  Musculoskeletal:       Positive for elbow pain.      Objective:    BP 134/70 (BP Location: Left Arm, Patient Position: Sitting, Cuff Size: Small)   Pulse 94   Temp 98.1 F (36.7 C) (Oral)   Resp 18   Ht 5' 3.5" (1.613 m)   Wt 94 lb (42.6 kg)   SpO2 94%   BMI 16.39 kg/m  Nursing note and vital signs reviewed.  Physical Exam  Constitutional: She is oriented to person, place, and time. She appears well-developed and well-nourished. No distress.  HENT:  Hair appears thin with no brittleness or changes.     Cardiovascular: Normal rate, regular rhythm, normal heart sounds and intact distal pulses.   Pulmonary/Chest: Effort normal and breath sounds normal.  Neurological: She is alert and oriented to person, place, and time.  Skin: Skin is warm and dry.  Right elbow with redish-brown area well circumscribed with scaly and dryness.   Psychiatric: She has a normal mood and affect. Her behavior is normal. Judgment and thought content normal.       Assessment & Plan:   Problem List Items Addressed This Visit      Musculoskeletal and Integument   Hair loss    Increasing hair loss loss with concern for possible vitamin or nutritional deficiency. Start biotin. Obtain TSH and albumin. Recommend increasing intake of protein with significant history of protein  calorie malnutrition which is most likely contributing to her current status. Ensure haircare products are not drying out hair. Follow-up pending blood work and trial of nutritional intervention.      Relevant Orders   TSH (Completed)   Albumin (Completed)   Rash    Rash consistent with eczematous appearance. Start triamcinolone cream and OTC lotions for moisturizing. Follow up if symptoms worsen or do not improve.         Other   Protein-calorie malnutrition, severe - Primary    Other Visit Diagnoses   None.      I am having Ms. Sarratt start on triamcinolone cream. I am also having her maintain her acetaminophen, aspirin, promethazine, lidocaine, multivitamin with minerals, busPIRone, docusate sodium, MELATONIN PO, Polyethyl Glycol-Propyl Glycol (SYSTANE OP), feeding supplement (ENSURE ENLIVE), ALPRAZolam, PARoxetine, butalbital-acetaminophen-caffeine, predniSONE, doxycycline, Fluticasone-Salmeterol, albuterol, ipratropium-albuterol, and umeclidinium bromide.   Meds ordered this encounter  Medications  . triamcinolone cream (KENALOG) 0.1 %    Sig: Apply 1 application topically 2 (two) times daily.    Dispense:  30 g    Refill:  0     Order Specific Question:   Supervising Provider    Answer:   Pricilla Holm A [[id number]]     Follow-up: Return in about 1 month (around 07/23/2016), or if symptoms worsen or fail to improve.  Mauricio Po, FNP

## 2016-06-22 NOTE — Assessment & Plan Note (Signed)
Rash consistent with eczematous appearance. Start triamcinolone cream and OTC lotions for moisturizing. Follow up if symptoms worsen or do not improve.

## 2016-07-07 ENCOUNTER — Telehealth: Payer: Self-pay | Admitting: *Deleted

## 2016-07-07 DIAGNOSIS — F4312 Post-traumatic stress disorder, chronic: Secondary | ICD-10-CM

## 2016-07-07 DIAGNOSIS — R519 Headache, unspecified: Secondary | ICD-10-CM

## 2016-07-07 DIAGNOSIS — R51 Headache: Secondary | ICD-10-CM

## 2016-07-07 MED ORDER — PAROXETINE HCL 20 MG PO TABS
20.0000 mg | ORAL_TABLET | Freq: Every day | ORAL | 0 refills | Status: DC
Start: 2016-07-07 — End: 2016-08-04

## 2016-07-07 MED ORDER — BUTALBITAL-APAP-CAFFEINE 50-325-40 MG PO TABS: 1.0000 | ORAL_TABLET | Freq: Two times a day (BID) | ORAL | 0 refills | Status: DC | PRN

## 2016-07-07 MED ORDER — ALPRAZOLAM 0.25 MG PO TABS: 0.2500 mg | ORAL_TABLET | Freq: Two times a day (BID) | ORAL | 0 refills | Status: DC | PRN

## 2016-07-07 NOTE — Addendum Note (Signed)
Addended by: Mauricio Po D on: 07/07/2016 02:42 PM   Modules accepted: Orders

## 2016-07-07 NOTE — Telephone Encounter (Signed)
Left msg on triage pt requesting refills on Paroxetine & alprazolam..../lmb

## 2016-07-07 NOTE — Telephone Encounter (Signed)
Medication refilled

## 2016-07-07 NOTE — Telephone Encounter (Signed)
Done

## 2016-07-07 NOTE — Telephone Encounter (Signed)
Notified pt med has been sent to [facility]. Pt also states she need a refill on her Fioricet for her headaches...Megan [name]

## 2016-07-08 DIAGNOSIS — H25812 Combined forms of age-related cataract, left eye: Secondary | ICD-10-CM | POA: Diagnosis not present

## 2016-07-08 DIAGNOSIS — H2512 Age-related nuclear cataract, left eye: Secondary | ICD-10-CM | POA: Diagnosis not present

## 2016-07-08 NOTE — Telephone Encounter (Signed)
Called pt no answer LMOM refill has been sent to [facility]...Megan [name]

## 2016-08-04 ENCOUNTER — Other Ambulatory Visit: Payer: Self-pay | Admitting: Family

## 2016-08-04 DIAGNOSIS — F4312 Post-traumatic stress disorder, chronic: Secondary | ICD-10-CM

## 2016-08-24 ENCOUNTER — Ambulatory Visit: Payer: Medicare Other | Admitting: Family

## 2016-08-27 ENCOUNTER — Encounter: Payer: Self-pay | Admitting: Family

## 2016-08-27 ENCOUNTER — Ambulatory Visit (INDEPENDENT_AMBULATORY_CARE_PROVIDER_SITE_OTHER): Payer: Medicare Other | Admitting: Family

## 2016-08-27 VITALS — BP 132/82 | HR 121 | Temp 98.0°F | Resp 20 | Ht 63.5 in | Wt 93.0 lb

## 2016-08-27 DIAGNOSIS — Z23 Encounter for immunization: Secondary | ICD-10-CM | POA: Diagnosis not present

## 2016-08-27 DIAGNOSIS — I2109 ST elevation (STEMI) myocardial infarction involving other coronary artery of anterior wall: Secondary | ICD-10-CM

## 2016-08-27 DIAGNOSIS — Z01818 Encounter for other preprocedural examination: Secondary | ICD-10-CM | POA: Diagnosis not present

## 2016-08-27 NOTE — Patient Instructions (Addendum)
Thank you for choosing [facility].  SUMMARY AND INSTRUCTIONS:  You are cleared for surgery.   Good luck with surgery.  We will fax the information to Dr. Arlina Robes.  Medication:  Continue to take your medications as prescribed.   Your prescription(s) have been submitted to your pharmacy or been printed and provided for you. Please take as directed and contact our office if you believe you are having problem(s) with the medication(s) or have any questions.  Follow up:  If your symptoms worsen or fail to improve, please contact our office for further instruction, or in case of emergency go directly to the emergency room at the closest medical facility.

## 2016-08-27 NOTE — Progress Notes (Signed)
Subjective:    Patient ID: Megan [name], female    DOB: [birth year], 65 y.o.   MRN: [id number]  Chief Complaint  Patient presents with  . Pre-op Exam    HPI:  Megan [name] is a 65 y.o. female who  has a past medical history of Anemia; Anxiety state (08/20/2015); CAP (community acquired pneumonia); CHF (congestive heart failure) ([place]); COPD (chronic obstructive pulmonary disease) ([place]); Depression; Essential hypertension (08/19/2015); GERD (gastroesophageal reflux disease); Headache; History of hiatal hernia; Myocardial infarction; and Shortness of breath dyspnea. and presents today for a pre-operative appointment.   Previously completed left cataract surgery without any complications. She is now scheduled to have the the right eye completed as she is experiencing the associated symptoms including pain, throbbing, and changes in vision. She will be undergoing conscious sedation.    Allergies  Allergen Reactions  . Penicillins Rash    Has patient had a PCN reaction causing immediate rash, facial/tongue/throat swelling, SOB or lightheadedness with hypotension: No Has patient had a PCN reaction causing severe rash involving mucus membranes or skin necrosis:NO Has patient had a PCN reaction that required hospitalization No Has patient had a PCN reaction occurring within the last 10 years:NO If all of the above answers are "NO", then may proceed with Cephalosporin use.      Outpatient Medications Prior to Visit  Medication Sig Dispense Refill  . acetaminophen (TYLENOL) 500 MG tablet Take 500 mg by mouth every 6 (six) hours as needed for mild pain.    Marland Kitchen albuterol (PROVENTIL HFA;VENTOLIN HFA) 108 (90 Base) MCG/ACT inhaler Inhale 1 puff into the lungs every 4 (four) hours as needed for wheezing or shortness of breath. 1 Inhaler 3  . ALPRAZolam (XANAX) 0.25 MG tablet Take 1 tablet (0.25 mg total) by mouth 2 (two) times daily as needed for anxiety. 60 tablet 0  . aspirin 81 MG chewable tablet  Chew 81 mg by mouth daily.    . busPIRone (BUSPAR) 7.5 MG tablet Take 2 tablets (15 mg total) by mouth 2 (two) times daily. 60 tablet 0  . butalbital-acetaminophen-caffeine (FIORICET, ESGIC) 50-325-40 MG tablet Take 1 tablet by mouth 2 (two) times daily as needed for headache. 20 tablet 0  . docusate sodium (COLACE) 100 MG capsule Take 100 mg by mouth daily as needed for mild constipation.    Marland Kitchen doxycycline (VIBRA-TABS) 100 MG tablet Take 1 tablet (100 mg total) by mouth 2 (two) times daily. 14 tablet 0  . feeding supplement, ENSURE ENLIVE, (ENSURE ENLIVE) LIQD Take 237 mLs by mouth 3 (three) times daily between meals. 237 mL 12  . Fluticasone-Salmeterol (ADVAIR) 500-50 MCG/DOSE AEPB Inhale 1 puff into the lungs 2 (two) times daily. 60 each 3  . ipratropium-albuterol (DUONEB) 0.5-2.5 (3) MG/3ML SOLN Take 3 mLs by nebulization every 6 (six) hours as needed (shortness of breath). 360 mL 3  . lidocaine (LIDODERM) 5 % Place 0.25 patches onto the skin daily as needed (pain). Remove & Discard patch within 12 hours or as directed by MD    . MELATONIN PO Take 1 tablet by mouth at bedtime as needed (sleep).    . Multiple Vitamin (MULTIVITAMIN WITH MINERALS) TABS tablet Take 1 tablet by mouth daily.    Marland Kitchen PARoxetine (PAXIL) 20 MG tablet TAKE 1 TABLET(20 MG) BY MOUTH DAILY 30 tablet 5  . Polyethyl Glycol-Propyl Glycol (SYSTANE OP) Place 1 drop into both eyes at bedtime. Reported on 05/19/2016    . predniSONE (DELTASONE) 10 MG tablet 4 tabs x  2 days, 3 tabs x 2 days, 2 tabs x 2 days 1 tab x 2 days then stop 20 tablet 0  . promethazine (PHENERGAN) 12.5 MG tablet Take 12.5 mg by mouth every 6 (six) hours as needed for nausea or vomiting. Reported on 05/19/2016    . triamcinolone cream (KENALOG) 0.1 % Apply 1 application topically 2 (two) times daily. 30 g 0  . umeclidinium bromide (INCRUSE ELLIPTA) 62.5 MCG/INH AEPB Inhale 1 puff into the lungs daily. 30 each 5   No facility-administered medications prior to visit.        Past Surgical History:  Procedure Laterality Date  . CARDIAC CATHETERIZATION    . CARDIAC CATHETERIZATION N/A 08/26/2015   Procedure: Left Heart Cath and Coronary Angiography;  Surgeon: Jettie Booze, MD;  Location: [facility];  Service: Cardiovascular;  Laterality: N/A;      Past Medical History:  Diagnosis Date  . Anemia   . Anxiety state 08/20/2015  . CAP (community acquired pneumonia)   . CHF (congestive heart failure) ([place])   . COPD (chronic obstructive pulmonary disease) ([place])   . Depression   . Essential hypertension 08/19/2015  . GERD (gastroesophageal reflux disease)   . Headache   . History of hiatal hernia   . Myocardial infarction   . Shortness of breath dyspnea       Review of Systems  Constitutional: Negative for activity change, appetite change, chills, diaphoresis, fatigue, fever and unexpected weight change.  HENT: Negative for congestion, drooling, ear discharge, ear pain, facial swelling, hearing loss, rhinorrhea, sinus pressure, sneezing, sore throat, tinnitus, trouble swallowing and voice change.   Respiratory: Positive for shortness of breath. Negative for apnea, cough, choking, chest tightness, wheezing and stridor.   Cardiovascular: Negative for chest pain, palpitations and leg swelling.  Gastrointestinal: Negative.   Endocrine: Negative.   Genitourinary: Negative.   Allergic/Immunologic: Negative.   Neurological: Negative for dizziness, tremors, seizures, syncope, facial asymmetry, speech difficulty, weakness, light-headedness, numbness and headaches.  Hematological: Negative.       Objective:    BP 132/82 (BP Location: Left Arm, Patient Position: Sitting, Cuff Size: Normal)   Pulse (!) 121   Temp 98 F (36.7 C) (Oral)   Resp 20   Ht 5' 3.5" (1.613 m)   Wt 93 lb (42.2 kg)   SpO2 95%   BMI 16.22 kg/m  Nursing note and vital signs reviewed.  Physical Exam  Constitutional: She is oriented to person, place, and time.  She appears well-developed and well-nourished.  HENT:  Head: Normocephalic.  Right Ear: Hearing, tympanic membrane, external ear and ear canal normal.  Left Ear: Hearing, tympanic membrane, external ear and ear canal normal.  Nose: Nose normal.  Mouth/Throat: Uvula is midline, oropharynx is clear and moist and mucous membranes are normal.  Eyes: Conjunctivae and EOM are normal. Pupils are equal, round, and reactive to light.  Neck: Neck supple. No JVD present. No tracheal deviation present. No thyromegaly present.  Cardiovascular: Normal rate, regular rhythm, normal heart sounds and intact distal pulses.   Pulmonary/Chest: Effort normal and breath sounds normal.  Abdominal: Soft. Bowel sounds are normal. She exhibits no distension and no mass. There is no tenderness. There is no rebound and no guarding.  Musculoskeletal: Normal range of motion. She exhibits no edema or tenderness.  Lymphadenopathy:    She has no cervical adenopathy.  Neurological: She is alert and oriented to person, place, and time. She has normal reflexes. No cranial nerve deficit. She  exhibits normal muscle tone. Coordination normal.  Skin: Skin is warm and dry.  Psychiatric: She has a normal mood and affect. Her behavior is normal. Judgment and thought content normal.       Assessment & Plan:   Problem List Items Addressed This Visit      Other   Pre-operative clearance - Primary    Medical, surgical and personal history reviewed and chronic conditions appear adequately controlled with no current exacerbations. She was noted to have tachycardia on exam today, however completed an albuterol treatment prior to office visit. Recommend SPO2 and pulse monitoring for procedure. Patient is medically cleared for the cataract surgery.        Other Visit Diagnoses   None.      I am having Ms. Sindelar maintain her acetaminophen, aspirin, promethazine, lidocaine, multivitamin with minerals, busPIRone, docusate sodium,  MELATONIN PO, Polyethyl Glycol-Propyl Glycol (SYSTANE OP), feeding supplement (ENSURE ENLIVE), predniSONE, doxycycline, Fluticasone-Salmeterol, albuterol, ipratropium-albuterol, umeclidinium bromide, triamcinolone cream, ALPRAZolam, butalbital-acetaminophen-caffeine, and PARoxetine.   Follow-up: Return if symptoms worsen or fail to improve.  Mauricio Po, FNP

## 2016-08-27 NOTE — Assessment & Plan Note (Signed)
Medical, surgical and personal history reviewed and chronic conditions appear adequately controlled with no current exacerbations. She was noted to have tachycardia on exam today, however completed an albuterol treatment prior to office visit. Recommend SPO2 and pulse monitoring for procedure. Patient is medically cleared for the cataract surgery.

## 2016-09-10 ENCOUNTER — Other Ambulatory Visit: Payer: Self-pay | Admitting: Emergency Medicine

## 2016-09-16 ENCOUNTER — Telehealth: Payer: Self-pay | Admitting: Family

## 2016-09-16 NOTE — Telephone Encounter (Signed)
Patient Name: Megan [name] DOB: [birth year] Initial Comment Caller has a hiatal hernia, can feel it on the outside of her stomach. it is painful when she coughs Nurse Assessment Nurse: Ronnald Ramp, RN, Miranda Date/Time (Eastern Time): 09/16/2016 2:01:51 PM Confirm and document reason for call. If symptomatic, describe symptoms. You must click the next button to save text entered. ---Caller states yesterday when she cough really hard, something came up in her abdomen above her belly button. She had severe pain. Symptoms have resolved. Has the patient traveled out of the country within the last 30 days? ---Not Applicable Does the patient have any new or worsening symptoms? ---Yes Will a triage be completed? ---Yes Related visit to physician within the last 2 weeks? ---No Does the PT have any chronic conditions? (i.e. diabetes, asthma, etc.) ---Yes List chronic conditions. ---Hiatal hernia, COPD Is this a behavioral health or substance abuse call? ---No Guidelines Guideline Title Affirmed Question Affirmed Notes Hernia [1] New-onset hernia suspected (reducible bulge in groin or abdomen; non-tender) AND [2] NO pain or vomiting Final Disposition User See PCP When Office is Open (within 3 days) Ronnald Ramp, RN, Miranda Comments No appt available with PCP, Appt scheduled with Wilfred Lacy tomorrow at 1030am Disagree/Comply: Comply

## 2016-09-17 ENCOUNTER — Encounter: Payer: Self-pay | Admitting: Nurse Practitioner

## 2016-09-17 ENCOUNTER — Other Ambulatory Visit (INDEPENDENT_AMBULATORY_CARE_PROVIDER_SITE_OTHER): Payer: Medicare Other

## 2016-09-17 ENCOUNTER — Ambulatory Visit (INDEPENDENT_AMBULATORY_CARE_PROVIDER_SITE_OTHER): Payer: Medicare Other | Admitting: Nurse Practitioner

## 2016-09-17 VITALS — BP 106/76 | HR 86 | Temp 97.6°F | Ht 63.5 in | Wt 94.0 lb

## 2016-09-17 DIAGNOSIS — R10815 Periumbilic abdominal tenderness: Secondary | ICD-10-CM | POA: Diagnosis not present

## 2016-09-17 DIAGNOSIS — J441 Chronic obstructive pulmonary disease with (acute) exacerbation: Secondary | ICD-10-CM

## 2016-09-17 DIAGNOSIS — M25511 Pain in right shoulder: Secondary | ICD-10-CM

## 2016-09-17 DIAGNOSIS — R21 Rash and other nonspecific skin eruption: Secondary | ICD-10-CM | POA: Diagnosis not present

## 2016-09-17 DIAGNOSIS — I2109 ST elevation (STEMI) myocardial infarction involving other coronary artery of anterior wall: Secondary | ICD-10-CM | POA: Diagnosis not present

## 2016-09-17 LAB — COMPREHENSIVE METABOLIC PANEL
ALBUMIN: 4.6 g/dL (ref 3.5–5.2)
ALT: 11 U/L (ref 0–35)
AST: 16 U/L (ref 0–37)
Alkaline Phosphatase: 70 U/L (ref 39–117)
BILIRUBIN TOTAL: 0.4 mg/dL (ref 0.2–1.2)
BUN: 14 mg/dL (ref 6–23)
CALCIUM: 9.7 mg/dL (ref 8.4–10.5)
CO2: 36 meq/L — AB (ref 19–32)
Chloride: 97 mEq/L (ref 96–112)
Creatinine, Ser: 0.67 mg/dL (ref 0.40–1.20)
GFR: 94.3 mL/min (ref >60.00)
Glucose, Bld: 121 mg/dL — ABNORMAL HIGH (ref 70–99)
Potassium: 4.1 mEq/L (ref 3.5–5.1)
Sodium: 140 mEq/L (ref 135–145)
Total Protein: 7.1 g/dL (ref 6.0–8.3)

## 2016-09-17 LAB — CBC WITH DIFFERENTIAL/PLATELET
BASOS ABS: 0 10*3/uL (ref 0.0–0.1)
Basophils Relative: 0.3 % (ref 0.0–3.0)
Eosinophils Absolute: 0 10*3/uL (ref 0.0–0.7)
Eosinophils Relative: 0.5 % (ref 0.0–5.0)
HEMATOCRIT: 43.4 % (ref 36.0–46.0)
HEMOGLOBIN: 14.7 g/dL (ref 12.0–15.0)
LYMPHS PCT: 21.4 % (ref 12.0–46.0)
Lymphs Abs: 1.3 10*3/uL (ref 0.7–4.0)
MCHC: 33.8 g/dL (ref 30.0–36.0)
MCV: 95.7 fl (ref 78.0–100.0)
Monocytes Absolute: 0.6 10*3/uL (ref 0.1–1.0)
Monocytes Relative: 9.5 % (ref 3.0–12.0)
NEUTROS ABS: 4.2 10*3/uL (ref 1.4–7.7)
Neutrophils Relative %: 68.3 % (ref 43.0–77.0)
PLATELETS: 343 10*3/uL (ref 150.0–400.0)
RBC: 4.54 Mil/uL (ref 3.87–5.11)
RDW: 12 % (ref 11.5–15.5)
WBC: 6.2 10*3/uL (ref 4.0–10.5)

## 2016-09-17 MED ORDER — DICLOFENAC SODIUM 2 % TD SOLN: 1.0000 "application " | Freq: Two times a day (BID) | TRANSDERMAL | 0 refills | Status: DC | PRN

## 2016-09-17 MED ORDER — TRIAMCINOLONE ACETONIDE 0.5 % EX OINT: 1.0000 "application " | TOPICAL_OINTMENT | Freq: Two times a day (BID) | CUTANEOUS | 0 refills | Status: DC

## 2016-09-17 MED ORDER — PREDNISONE 10 MG (21) PO TBPK: 10.0000 mg | ORAL_TABLET | Freq: Every day | ORAL | 0 refills | Status: DC

## 2016-09-17 NOTE — Progress Notes (Signed)
Pre visit review using our clinic review tool, if applicable. No additional management support is needed unless otherwise documented below in the visit note. 

## 2016-09-17 NOTE — Progress Notes (Signed)
Subjective:  Patient ID: Megan [name], female    DOB: 10/19/1951  Age: 65 y.o. MRN: [id number]  CC: Abdominal Pain (Pt stated right lower abdominal is tender/sharp pain  2 days)   Abdominal Pain  This is a new problem. The current episode started yesterday. The onset quality is sudden. The problem occurs intermittently. The problem has been resolved. The pain is located in the periumbilical region (right side). The patient is experiencing no pain. The quality of the pain is sharp. The abdominal pain does not radiate. Pertinent negatives include no anorexia, belching, constipation, diarrhea, dysuria, fever, frequency, headaches, hematochezia, hematuria, melena, myalgias, nausea or vomiting. Nothing aggravates the pain. The pain is relieved by nothing. She has tried nothing for the symptoms.  Shoulder Pain   The pain is present in the right arm. This is a chronic problem. The current episode started more than 1 year ago. There has been no history of extremity trauma. The problem occurs intermittently. The problem has been gradually worsening. The quality of the pain is described as aching and sharp. Associated symptoms include a limited range of motion. Pertinent negatives include no fever, inability to bear weight, joint swelling, numbness, stiffness or tingling. The symptoms are aggravated by activity. She has tried nothing for the symptoms. Her past medical history is significant for osteoarthritis.  Last BM this morning (normal per patient).   COPD: She is also requesting prednisone dose pack to manage COPD exacerbation, last dose pack used 03/2016, last dose pack used May 2017, prescribed by pulmonology. She complains of increased shortness of breath, wheezing, and cough. She is using prescribed inhalers. Denies any fever, no orthopnea or PND, no edema, no chest pain or palpitations.  Outpatient Medications Prior to Visit  Medication Sig Dispense Refill  . acetaminophen (TYLENOL) 500 MG  tablet Take 500 mg by mouth every 6 (six) hours as needed for mild pain.    Marland Kitchen ALPRAZolam (XANAX) 0.25 MG tablet Take 1 tablet (0.25 mg total) by mouth 2 (two) times daily as needed for anxiety. 60 tablet 0  . aspirin 81 MG chewable tablet Chew 81 mg by mouth daily.    . busPIRone (BUSPAR) 7.5 MG tablet Take 2 tablets (15 mg total) by mouth 2 (two) times daily. 60 tablet 0  . butalbital-acetaminophen-caffeine (FIORICET, ESGIC) 50-325-40 MG tablet Take 1 tablet by mouth 2 (two) times daily as needed for headache. 20 tablet 0  . docusate sodium (COLACE) 100 MG capsule Take 100 mg by mouth daily as needed for mild constipation.    . feeding supplement, ENSURE ENLIVE, (ENSURE ENLIVE) LIQD Take 237 mLs by mouth 3 (three) times daily between meals. 237 mL 12  . Fluticasone-Salmeterol (ADVAIR) 500-50 MCG/DOSE AEPB Inhale 1 puff into the lungs 2 (two) times daily. 60 each 3  . ipratropium-albuterol (DUONEB) 0.5-2.5 (3) MG/3ML SOLN Take 3 mLs by nebulization every 6 (six) hours as needed (shortness of breath). 360 mL 3  . lidocaine (LIDODERM) 5 % Place 0.25 patches onto the skin daily as needed (pain). Remove & Discard patch within 12 hours or as directed by MD    . MELATONIN PO Take 1 tablet by mouth at bedtime as needed (sleep).    . Multiple Vitamin (MULTIVITAMIN WITH MINERALS) TABS tablet Take 1 tablet by mouth daily.    Marland Kitchen PARoxetine (PAXIL) 20 MG tablet TAKE 1 TABLET(20 MG) BY MOUTH DAILY 30 tablet 5  . Polyethyl Glycol-Propyl Glycol (SYSTANE OP) Place 1 drop into both eyes at bedtime. Reported  on 05/19/2016    . promethazine (PHENERGAN) 12.5 MG tablet Take 12.5 mg by mouth every 6 (six) hours as needed for nausea or vomiting. Reported on 05/19/2016    . umeclidinium bromide (INCRUSE ELLIPTA) 62.5 MCG/INH AEPB Inhale 1 puff into the lungs daily. 30 each 5  . VENTOLIN HFA 108 (90 Base) MCG/ACT inhaler INHALE 1 PUFF INTO THE LUNGS EVERY 4 HOURS AS NEEDED FOR WHEEZING OR SHORTNESS OF BREATH 18 g 2  .  doxycycline (VIBRA-TABS) 100 MG tablet Take 1 tablet (100 mg total) by mouth 2 (two) times daily. 14 tablet 0  . predniSONE (DELTASONE) 10 MG tablet 4 tabs x 2 days, 3 tabs x 2 days, 2 tabs x 2 days 1 tab x 2 days then stop 20 tablet 0  . triamcinolone cream (KENALOG) 0.1 % Apply 1 application topically 2 (two) times daily. 30 g 0   No facility-administered medications prior to visit.     ROS See HPI  Objective:  BP 106/76 (BP Location: Left Arm, Patient Position: Sitting, Cuff Size: Normal)   Pulse 86   Temp 97.6 F (36.4 C)   Ht 5' 3.5" (1.613 m)   Wt 94 lb (42.6 kg)   SpO2 93%   BMI 16.39 kg/m   BP Readings from Last 3 Encounters:  09/17/16 106/76  08/27/16 132/82  06/22/16 134/70    Wt Readings from Last 3 Encounters:  09/17/16 94 lb (42.6 kg)  08/27/16 93 lb (42.2 kg)  06/22/16 94 lb (42.6 kg)    Physical Exam  Constitutional: She is oriented to person, place, and time. No distress.  Neck: Normal range of motion. Neck supple.  Cardiovascular: Normal rate and normal heart sounds.   Pulmonary/Chest: Effort normal. No respiratory distress. She has wheezes.  Abdominal: Soft. Bowel sounds are normal. She exhibits no distension and no mass. There is no tenderness. There is no rebound and no guarding.  Musculoskeletal: She exhibits tenderness. She exhibits no edema.       Right shoulder: She exhibits decreased range of motion, tenderness, crepitus and abnormal pulse. She exhibits no bony tenderness, no swelling, no effusion, no deformity, no spasm and normal strength.       Right elbow: Normal.      Right wrist: Normal.       Cervical back: Normal.  Anterior shoulder tenderness. Unable to lift arm above shoulder level.  Lymphadenopathy:    She has no cervical adenopathy.  Neurological: She is alert and oriented to person, place, and time.  Skin: Skin is warm and dry. Lesion and rash noted. No erythema.     Vitals reviewed.   Lab Results  Component Value Date    WBC 6.2 09/17/2016   HGB 14.7 09/17/2016   HCT 43.4 09/17/2016   PLT 343.0 09/17/2016   GLUCOSE 121 (H) 09/17/2016   ALT 11 09/17/2016   AST 16 09/17/2016   NA 140 09/17/2016   K 4.1 09/17/2016   CL 97 09/17/2016   CREATININE 0.67 09/17/2016   BUN 14 09/17/2016   CO2 36 (H) 09/17/2016   TSH 0.94 06/22/2016   INR 1.12 08/26/2015    Dg Chest 2 View  Result Date: 02/25/2016 CLINICAL DATA:  Follow-up pneumonia; persistent cough and shortness of breath, history of COPD, former smoker, CHF and M eye. EXAM: CHEST  2 VIEW COMPARISON:  Portable chest x-ray of February 18, 2016 FINDINGS: The lungs remain markedly hyperinflated. There is no focal infiltrate. There is no significant residual pleural effusion.  The heart and pulmonary vascularity are normal. The mediastinum is normal in width. The trachea is midline. The bony thorax is unremarkable. IMPRESSION: COPD without evidence of pneumonia or CHF. Interval resolution of small left pleural effusion. Electronically Signed   By: David  Martinique M.D.   On: 02/25/2016 16:47    Assessment & Plan:   Megan [name] was seen today for abdominal pain.  Diagnoses and all orders for this visit:  Rash  Right anterior shoulder pain -     Discontinue: Diclofenac Sodium (PENNSAID) 2 % SOLN; Place 1 application onto the skin 2 (two) times daily as needed. Apply to right shoulder -     Diclofenac Sodium (PENNSAID) 2 % SOLN; Place 1 application onto the skin 2 (two) times daily as needed. Apply to right shoulder -     Discontinue: predniSONE (STERAPRED UNI-PAK 21 TAB) 10 MG (21) TBPK tablet; Take 1 tablet (10 mg total) by mouth daily. -     predniSONE (STERAPRED UNI-PAK 21 TAB) 10 MG (21) TBPK tablet; Take 1 tablet (10 mg total) by mouth daily.  Periumbilical abdominal tenderness without rebound tenderness -     Cancel: POCT urinalysis dipstick -     CBC w/Diff; Future -     Comp Met (CMET); Future -     Urinalysis, Routine w reflex microscopic; Future  Acute  exacerbation of chronic obstructive pulmonary disease (COPD) (HCC) -     Discontinue: predniSONE (STERAPRED UNI-PAK 21 TAB) 10 MG (21) TBPK tablet; Take 1 tablet (10 mg total) by mouth daily. -     predniSONE (STERAPRED UNI-PAK 21 TAB) 10 MG (21) TBPK tablet; Take 1 tablet (10 mg total) by mouth daily.  Other orders -     triamcinolone ointment (KENALOG) 0.5 %; Apply 1 application topically 2 (two) times daily. Apply to right elbow   I have discontinued Ms. Remigio's doxycycline and triamcinolone cream. I am also having her start on triamcinolone ointment. Additionally, I am having her maintain her acetaminophen, aspirin, promethazine, lidocaine, multivitamin with minerals, busPIRone, docusate sodium, MELATONIN PO, Polyethyl Glycol-Propyl Glycol (SYSTANE OP), feeding supplement (ENSURE ENLIVE), Fluticasone-Salmeterol, ipratropium-albuterol, umeclidinium bromide, ALPRAZolam, butalbital-acetaminophen-caffeine, PARoxetine, VENTOLIN HFA, Diclofenac Sodium, and predniSONE.  Meds ordered this encounter  Medications  . DISCONTD: Diclofenac Sodium (PENNSAID) 2 % SOLN    Sig: Place 1 application onto the skin 2 (two) times daily as needed. Apply to right shoulder    Dispense:  2 g    Refill:  0    Order Specific Question:   Supervising Provider    Answer:   Cassandria Anger [[id number]]  . triamcinolone ointment (KENALOG) 0.5 %    Sig: Apply 1 application topically 2 (two) times daily. Apply to right elbow    Dispense:  30 g    Refill:  0    Order Specific Question:   Supervising Provider    Answer:   Cassandria Anger [[id number]]  . Diclofenac Sodium (PENNSAID) 2 % SOLN    Sig: Place 1 application onto the skin 2 (two) times daily as needed. Apply to right shoulder    Dispense:  2 g    Refill:  0    Order Specific Question:   Supervising Provider    Answer:   Cassandria Anger [[id number]]  . DISCONTD: predniSONE (STERAPRED UNI-PAK 21 TAB) 10 MG (21) TBPK tablet    Sig: Take 1 tablet (10 mg total) by  mouth daily.    Dispense:  21 tablet    Refill:  0    Order Specific Question:   Supervising Provider    Answer:   Cassandria Anger [[id number]]  . predniSONE (STERAPRED UNI-PAK 21 TAB) 10 MG (21) TBPK tablet    Sig: Take 1 tablet (10 mg total) by mouth daily.    Dispense:  21 tablet    Refill:  0    Order Specific Question:   Supervising Provider    Answer:   Cassandria Anger [[id number]]   CMP Latest Ref Rng & Units 09/17/2016 02/20/2016 02/19/2016  Glucose 70 - 99 mg/dL 121(H) 64(L) 107(H)  BUN 6 - 23 mg/dL '14 17 20  ' Creatinine 0.40 - 1.20 mg/dL 0.67 0.62 0.60  Sodium 135 - 145 mEq/L 140 137 139  Potassium 3.5 - 5.1 mEq/L 4.1 4.2 4.4  Chloride 96 - 112 mEq/L 97 88(L) 94(L)  CO2 19 - 32 mEq/L 36(H) 38(H) 37(H)  Calcium 8.4 - 10.5 mg/dL 9.7 8.2(L) 8.4(L)  Total Protein 6.0 - 8.3 g/dL 7.1 - -  Total Bilirubin 0.2 - 1.2 mg/dL 0.4 - -  Alkaline Phos 39 - 117 U/L 70 - -  AST 0 - 37 U/L 16 - -  ALT 0 - 35 U/L 11 - -   CBC Latest Ref Rng & Units 09/17/2016 02/25/2016 02/20/2016  WBC 4.0 - 10.5 K/uL 6.2 11.1(H) 11.7(H)  Hemoglobin 12.0 - 15.0 g/dL 14.7 12.4 13.0  Hematocrit 36.0 - 46.0 % 43.4 37.9 39.0  Platelets 150.0 - 400.0 K/uL 343.0 504.0(H) 406(H)   Follow-up: Return if symptoms worsen or fail to improve.  Wilfred Lacy, NP

## 2016-09-17 NOTE — Patient Instructions (Addendum)
She declined right shoulder joint injection. She would prefer oral prednisone which would also help with her lungs.  Contact pulmonary if no improvement in one week.  Use Pennsaid gel to right shoulder twice a day as needed. Do shoulder exercises once a day and apply cold compress after exercise. Return to office for joint injection if no improvement in 2 weeks.  Use triamcinolone ointment and moisturizer to right elbow twice a day as needed.  Go to basement for lab draw. You will be called with lab results.   Hand Dermatitis Introduction Hand dermatitis is a skin condition. It causes small, itchy, raised dots or fluid-filled blisters to form on the palms of the hands. This condition may also be called hand eczema. Follow these instructions at home:  Take or apply over-the-counter and prescription medicines only as told by your doctor.  If you were prescribed an antibiotic medicine, use it as told by your doctor. Do not stop using the antibiotic even if you start to feel better.  Avoid washing your hands more often than you need to.  Avoid using harsh chemicals on your hands.  Wear gloves that protect your hands when you handle products that can bother (irritate) your skin.  Keep all follow-up visits as told by your doctor. This is important. Contact a doctor if:  Your rash is not better after one week of treatment.  Your rash is red.  Your rash is tender.  Your rash has pus coming from it.  Your rash spreads. This information is not intended to replace advice given to you by your health care provider. Make sure you discuss any questions you have with your health care provider. Document Released: 01/12/2010 Document Revised: 03/25/2016 Document Reviewed: 05/02/2015  2017 [employer]

## 2016-09-22 ENCOUNTER — Telehealth: Payer: Self-pay | Admitting: Emergency Medicine

## 2016-09-22 NOTE — Telephone Encounter (Signed)
Patient is concerned about c02 level---and the fact that she is not any better, I did suggest either urgent care or ED today or tomorrow, or if she wants to wait until Friday, we are having clinics from 9 until noon for acutes, patient will call back for appt if no better by friday

## 2016-09-22 NOTE — Telephone Encounter (Signed)
Pt asked that you call her back about her labs. She has some questions. Please advise thanks.

## 2016-10-01 ENCOUNTER — Telehealth: Payer: Self-pay | Admitting: Acute Care

## 2016-10-01 NOTE — Telephone Encounter (Signed)
She has not been to the office since 01/2016. No CXR since 01/2016. The prednisone prescribed by her PCP on 09/17/16 was for a skin rash, not respiratory problems. She needs to be seen and have a CXR  before we will prescribe prednisone taper.Thanks

## 2016-10-01 NOTE — Telephone Encounter (Signed)
Called and spoke with pt and she stated that she was given a pred taper by her PCP, but this was not her usual high dose to start out with, and she stated that it helped some, but not enough.  She is calling back to set up appt next week for appt.  She stated that they only have 1 car and her grandchild is in the ICU at [facility] and she has been caring for her other 2 grandchildren.  SG please advise if we can send in some prednisone for her.  Thanks  Allergies  Allergen Reactions  . Penicillins Rash    Has patient had a PCN reaction causing immediate rash, facial/tongue/throat swelling, SOB or lightheadedness with hypotension: No Has patient had a PCN reaction causing severe rash involving mucus membranes or skin necrosis:NO Has patient had a PCN reaction that required hospitalization No Has patient had a PCN reaction occurring within the last 10 years:NO If all of the above answers are "NO", then may proceed with Cephalosporin use.

## 2016-10-01 NOTE — Telephone Encounter (Signed)
Spoke with pt. And informed her about SG message pt. Was not happy with the decision.  She expressed "I guess I will have to just go without" Nothing further is needed at this time.

## 2016-11-28 ENCOUNTER — Other Ambulatory Visit: Payer: Self-pay | Admitting: Emergency Medicine

## 2016-11-29 ENCOUNTER — Other Ambulatory Visit: Payer: Self-pay | Admitting: Emergency Medicine

## 2016-12-09 ENCOUNTER — Other Ambulatory Visit: Payer: Self-pay | Admitting: Emergency Medicine

## 2016-12-22 ENCOUNTER — Ambulatory Visit (INDEPENDENT_AMBULATORY_CARE_PROVIDER_SITE_OTHER): Payer: Medicare Other | Admitting: Family

## 2016-12-22 ENCOUNTER — Encounter: Payer: Self-pay | Admitting: Family

## 2016-12-22 VITALS — BP 138/70 | HR 93 | Temp 98.2°F | Resp 16 | Ht 63.5 in | Wt 91.1 lb

## 2016-12-22 DIAGNOSIS — R51 Headache: Secondary | ICD-10-CM

## 2016-12-22 DIAGNOSIS — F411 Generalized anxiety disorder: Secondary | ICD-10-CM

## 2016-12-22 DIAGNOSIS — F4312 Post-traumatic stress disorder, chronic: Secondary | ICD-10-CM

## 2016-12-22 DIAGNOSIS — R519 Headache, unspecified: Secondary | ICD-10-CM

## 2016-12-22 DIAGNOSIS — J069 Acute upper respiratory infection, unspecified: Secondary | ICD-10-CM | POA: Diagnosis not present

## 2016-12-22 MED ORDER — AZITHROMYCIN 250 MG PO TABS: ORAL_TABLET | ORAL | 0 refills | Status: DC

## 2016-12-22 MED ORDER — ALPRAZOLAM 0.25 MG PO TABS: 0.2500 mg | ORAL_TABLET | Freq: Two times a day (BID) | ORAL | 0 refills | Status: DC | PRN

## 2016-12-22 MED ORDER — BUTALBITAL-APAP-CAFFEINE 50-325-40 MG PO TABS: 1.0000 | ORAL_TABLET | Freq: Two times a day (BID) | ORAL | 0 refills | Status: DC | PRN

## 2016-12-22 NOTE — Assessment & Plan Note (Signed)
Refill Fioricet.

## 2016-12-22 NOTE — Progress Notes (Signed)
Subjective:    Patient ID: Megan [name], female    DOB: Nov 06, 1950, 66 y.o.   MRN: [id number]  Chief Complaint  Patient presents with  . Cough    congestion, cough, nose bleeds, chills, refill of alprazolam    HPI:  Megan [name] is a 66 y.o. female who  has a past medical history of Anemia; Anxiety state (08/20/2015); CAP (community acquired pneumonia); CHF (congestive heart failure) ([place]); COPD (chronic obstructive pulmonary disease) ([place]); Depression; Essential hypertension (08/19/2015); GERD (gastroesophageal reflux disease); Headache; History of hiatal hernia; Myocardial infarction; and Shortness of breath dyspnea. and presents today for an acute office visit.  1.) Cold symptoms - This is a new problem. Associated symptoms of congestion, cough, nose bleed and chills has been going on for about 3-4 days. Denies fevers. Symptoms have gradually worsened over the past 2 days. Modifying factors include Tylenol cold/flu which did not help very much. No recent antibiotics. Nose bleeds have stopped.   2.) Anxiety - Currently maintained on alprazolam taken as needed. Notes she is under significant amount of increased stress secondary to her granddaughter being on hospice. Reports taking the medication as prescribed and denies adverse side effects. Symptoms are generally well controlled with current medication regimen when taken as needed.  Allergies  Allergen Reactions  . Penicillins Rash    Has patient had a PCN reaction causing immediate rash, facial/tongue/throat swelling, SOB or lightheadedness with hypotension: No Has patient had a PCN reaction causing severe rash involving mucus membranes or skin necrosis:NO Has patient had a PCN reaction that required hospitalization No Has patient had a PCN reaction occurring within the last 10 years:NO If all of the above answers are "NO", then may proceed with Cephalosporin use.      Outpatient Medications Prior to Visit  Medication Sig Dispense  Refill  . acetaminophen (TYLENOL) 500 MG tablet Take 500 mg by mouth every 6 (six) hours as needed for mild pain.    Marland Kitchen aspirin 81 MG chewable tablet Chew 81 mg by mouth daily.    . Diclofenac Sodium (PENNSAID) 2 % SOLN Place 1 application onto the skin 2 (two) times daily as needed. Apply to right shoulder 2 g 0  . docusate sodium (COLACE) 100 MG capsule Take 100 mg by mouth daily as needed for mild constipation.    . feeding supplement, ENSURE ENLIVE, (ENSURE ENLIVE) LIQD Take 237 mLs by mouth 3 (three) times daily between meals. 237 mL 12  . Fluticasone-Salmeterol (ADVAIR) 500-50 MCG/DOSE AEPB Inhale 1 puff into the lungs 2 (two) times daily. 60 each 3  . INCRUSE ELLIPTA 62.5 MCG/INH AEPB INHALE 1 PUFF INTO THE LUNGS DAILY 30 each 2  . ipratropium-albuterol (DUONEB) 0.5-2.5 (3) MG/3ML SOLN Take 3 mLs by nebulization every 6 (six) hours as needed (shortness of breath). 360 mL 3  . lidocaine (LIDODERM) 5 % Place 0.25 patches onto the skin daily as needed (pain). Remove & Discard patch within 12 hours or as directed by MD    . MELATONIN PO Take 1 tablet by mouth at bedtime as needed (sleep).    . Multiple Vitamin (MULTIVITAMIN WITH MINERALS) TABS tablet Take 1 tablet by mouth daily.    Marland Kitchen PARoxetine (PAXIL) 20 MG tablet TAKE 1 TABLET(20 MG) BY MOUTH DAILY 30 tablet 5  . Polyethyl Glycol-Propyl Glycol (SYSTANE OP) Place 1 drop into both eyes at bedtime. Reported on 05/19/2016    . predniSONE (STERAPRED UNI-PAK 21 TAB) 10 MG (21) TBPK tablet Take 1 tablet (10  mg total) by mouth daily. 21 tablet 0  . promethazine (PHENERGAN) 12.5 MG tablet Take 12.5 mg by mouth every 6 (six) hours as needed for nausea or vomiting. Reported on 05/19/2016    . triamcinolone ointment (KENALOG) 0.5 % Apply 1 application topically 2 (two) times daily. Apply to right elbow 30 g 0  . VENTOLIN HFA 108 (90 Base) MCG/ACT inhaler INHALE 1 PUFF INTO THE LUNGS EVERY 4 HOURS AS NEEDED FOR WHEEZING OR SHORTNESS OF BREATH 18 g 0  .  ALPRAZolam (XANAX) 0.25 MG tablet Take 1 tablet (0.25 mg total) by mouth 2 (two) times daily as needed for anxiety. 60 tablet 0  . busPIRone (BUSPAR) 7.5 MG tablet Take 2 tablets (15 mg total) by mouth 2 (two) times daily. 60 tablet 0  . butalbital-acetaminophen-caffeine (FIORICET, ESGIC) 50-325-40 MG tablet Take 1 tablet by mouth 2 (two) times daily as needed for headache. 20 tablet 0   No facility-administered medications prior to visit.       Past Surgical History:  Procedure Laterality Date  . CARDIAC CATHETERIZATION    . CARDIAC CATHETERIZATION N/A 08/26/2015   Procedure: Left Heart Cath and Coronary Angiography;  Surgeon: Jettie Booze, MD;  Location: [facility];  Service: Cardiovascular;  Laterality: N/A;      Past Medical History:  Diagnosis Date  . Anemia   . Anxiety state 08/20/2015  . CAP (community acquired pneumonia)   . CHF (congestive heart failure) ([place])   . COPD (chronic obstructive pulmonary disease) ([place])   . Depression   . Essential hypertension 08/19/2015  . GERD (gastroesophageal reflux disease)   . Headache   . History of hiatal hernia   . Myocardial infarction   . Shortness of breath dyspnea       Review of Systems  Constitutional: Negative for chills and fever.  HENT: Positive for congestion and sore throat. Negative for ear pain, sinus pain and sinus pressure.   Respiratory: Positive for cough.   Neurological: Positive for headaches.  Psychiatric/Behavioral: Negative for decreased concentration.      Objective:    BP 138/70 (BP Location: Left Arm, Patient Position: Sitting, Cuff Size: Normal)   Pulse 93   Temp 98.2 F (36.8 C) (Oral)   Resp 16   Ht 5' 3.5" (1.613 m)   Wt 91 lb 1.9 oz (41.3 kg)   SpO2 95%   BMI 15.89 kg/m  Nursing note and vital signs reviewed.  Physical Exam  Constitutional: She is oriented to person, place, and time. She appears cachectic. No distress.  Seated on the exam table with portable oxygen  and able to speak in complete sentences and answers questions appropriately.   HENT:  Right Ear: Hearing, tympanic membrane, external ear and ear canal normal.  Left Ear: Hearing, tympanic membrane, external ear and ear canal normal.  Nose: Nose normal. Right sinus exhibits no maxillary sinus tenderness and no frontal sinus tenderness. Left sinus exhibits no maxillary sinus tenderness and no frontal sinus tenderness.  Mouth/Throat: Uvula is midline, oropharynx is clear and moist and mucous membranes are normal.  Cardiovascular: Normal rate, regular rhythm, normal heart sounds and intact distal pulses.   Pulmonary/Chest: No respiratory distress. She has wheezes. She has no rales. She exhibits no tenderness.  Neurological: She is alert and oriented to person, place, and time.  Skin: Skin is warm and dry. She is not diaphoretic.  Psychiatric: She has a normal mood and affect. Her behavior is normal. Judgment and thought content  normal.       Assessment & Plan:   Problem List Items Addressed This Visit      Respiratory   Acute upper respiratory infection - Primary    Symptoms and exam consistent with acute upper respiratory infection. Given patient's current multiple comorbidities, start azithromycin. Continue previously prescribed COPD medications and over-the-counter medications as needed for symptom relief and supportive care. Follow-up if symptoms worsen or do not improve.      Relevant Medications   azithromycin (ZITHROMAX) 250 MG tablet     Other   Anxiety state    Symptoms of anxiety are currently well managed with alprazolam taken as needed. Does continue to express increased levels of stress secondary to family situation. Continue current dosage of alprazolam. [place] controlled substance database reviewed with no irregularities.      Relevant Medications   ALPRAZolam (XANAX) 0.25 MG tablet   Chronic post-traumatic stress disorder (PTSD)   Relevant Medications    ALPRAZolam (XANAX) 0.25 MG tablet   Mixed headache    Refill Fioricet.      Relevant Medications   butalbital-acetaminophen-caffeine (FIORICET, ESGIC) 50-325-40 MG tablet       I have discontinued Ms. Eastwood's busPIRone. I am also having her start on azithromycin. Additionally, I am having her maintain her acetaminophen, aspirin, promethazine, lidocaine, multivitamin with minerals, docusate sodium, MELATONIN PO, Polyethyl Glycol-Propyl Glycol (SYSTANE OP), feeding supplement (ENSURE ENLIVE), Fluticasone-Salmeterol, ipratropium-albuterol, PARoxetine, triamcinolone ointment, Diclofenac Sodium, predniSONE, INCRUSE ELLIPTA, VENTOLIN HFA, ALPRAZolam, and butalbital-acetaminophen-caffeine.   Meds ordered this encounter  Medications  . ALPRAZolam (XANAX) 0.25 MG tablet    Sig: Take 1 tablet (0.25 mg total) by mouth 2 (two) times daily as needed for anxiety.    Dispense:  60 tablet    Refill:  0    Order Specific Question:   Supervising Provider    Answer:   Pricilla Holm A [[id number]]  . butalbital-acetaminophen-caffeine (FIORICET, ESGIC) 50-325-40 MG tablet    Sig: Take 1 tablet by mouth 2 (two) times daily as needed for headache.    Dispense:  20 tablet    Refill:  0    Order Specific Question:   Supervising Provider    Answer:   Pricilla Holm A [[id number]]  . azithromycin (ZITHROMAX) 250 MG tablet    Sig: Take 2 tablets by mouth for 1 day and 1 tablet by mouth daily for 4 days.    Dispense:  6 tablet    Refill:  0    Order Specific Question:   Supervising Provider    Answer:   Pricilla Holm A [[id number]]     Follow-up: Return if symptoms worsen or fail to improve.  Mauricio Po, FNP

## 2016-12-22 NOTE — Patient Instructions (Addendum)
Thank you for choosing [facility].  SUMMARY AND INSTRUCTIONS:  Medication:  Your prescription(s) have been submitted to your pharmacy or been printed and provided for you. Please take as directed and contact our office if you believe you are having problem(s) with the medication(s) or have any questions.  Follow up:  If your symptoms worsen or fail to improve, please contact our office for further instruction, or in case of emergency go directly to the emergency room at the closest medical facility.    General Recommendations:    Please drink plenty of fluids.  Get plenty of rest   Sleep in humidified air  Use saline nasal sprays  Netti pot   OTC Medications:  Decongestants - helps relieve congestion   Flonase (generic fluticasone) or Nasacort (generic triamcinolone) - please make sure to use the "cross-over" technique at a 45 degree angle towards the opposite eye as opposed to straight up the nasal passageway.   Sudafed (generic pseudoephedrine - Note this is the one that is available behind the pharmacy counter); Products with phenylephrine (-PE) may also be used but is often not as effective as pseudoephedrine.   If you have HIGH BLOOD PRESSURE - Coricidin HBP; AVOID any product that is -D as this contains pseudoephedrine which may increase your blood pressure.  Afrin (oxymetazoline) every 6-8 hours for up to 3 days.   Allergies - helps relieve runny nose, itchy eyes and sneezing   Claritin (generic loratidine), Allegra (fexofenidine), or Zyrtec (generic cyrterizine) for runny nose. These medications should not cause drowsiness.  Note - Benadryl (generic diphenhydramine) may be used however may cause drowsiness  Cough -   Delsym or Robitussin (generic dextromethorphan)  Expectorants - helps loosen mucus to ease removal   Mucinex (generic guaifenesin) as directed on the package.  Headaches / General Aches   Tylenol (generic acetaminophen) - DO NOT  EXCEED 3 grams (3,000 mg) in a 24 hour time period  Advil/Motrin (generic ibuprofen)   Sore Throat -   Salt water gargle   Chloraseptic (generic benzocaine) spray or lozenges / Sucrets (generic dyclonine)     

## 2016-12-22 NOTE — Assessment & Plan Note (Signed)
Symptoms of anxiety are currently well managed with alprazolam taken as needed. Does continue to express increased levels of stress secondary to family situation. Continue current dosage of alprazolam. [place] controlled substance database reviewed with no irregularities.

## 2016-12-22 NOTE — Assessment & Plan Note (Signed)
Symptoms and exam consistent with acute upper respiratory infection. Given patient's current multiple comorbidities, start azithromycin. Continue previously prescribed COPD medications and over-the-counter medications as needed for symptom relief and supportive care. Follow-up if symptoms worsen or do not improve.

## 2017-01-03 ENCOUNTER — Inpatient Hospital Stay (HOSPITAL_COMMUNITY)
Admission: EM | Admit: 2017-01-03 | Discharge: 2017-01-11 | DRG: 190 | Disposition: A | Discharge status: Home / Self Care | Payer: Medicare Other | Attending: Internal Medicine | Admitting: Internal Medicine

## 2017-01-03 ENCOUNTER — Ambulatory Visit: Payer: Medicare Other | Admitting: Pulmonary Disease

## 2017-01-03 ENCOUNTER — Emergency Department (HOSPITAL_COMMUNITY): Payer: Medicare Other

## 2017-01-03 ENCOUNTER — Encounter (HOSPITAL_COMMUNITY): Payer: Self-pay | Admitting: *Deleted

## 2017-01-03 ENCOUNTER — Other Ambulatory Visit: Payer: Self-pay | Admitting: Emergency Medicine

## 2017-01-03 DIAGNOSIS — J440 Chronic obstructive pulmonary disease with acute lower respiratory infection: Secondary | ICD-10-CM | POA: Diagnosis present

## 2017-01-03 DIAGNOSIS — J9602 Acute respiratory failure with hypercapnia: Secondary | ICD-10-CM | POA: Diagnosis present

## 2017-01-03 DIAGNOSIS — Z9981 Dependence on supplemental oxygen: Secondary | ICD-10-CM

## 2017-01-03 DIAGNOSIS — J9600 Acute respiratory failure, unspecified whether with hypoxia or hypercapnia: Secondary | ICD-10-CM | POA: Diagnosis present

## 2017-01-03 DIAGNOSIS — R0602 Shortness of breath: Secondary | ICD-10-CM

## 2017-01-03 DIAGNOSIS — Z79899 Other long term (current) drug therapy: Secondary | ICD-10-CM

## 2017-01-03 DIAGNOSIS — Z681 Body mass index (BMI) 19 or less, adult: Secondary | ICD-10-CM

## 2017-01-03 DIAGNOSIS — J441 Chronic obstructive pulmonary disease with (acute) exacerbation: Secondary | ICD-10-CM | POA: Diagnosis not present

## 2017-01-03 DIAGNOSIS — R519 Headache, unspecified: Secondary | ICD-10-CM

## 2017-01-03 DIAGNOSIS — R000 Tachycardia, unspecified: Secondary | ICD-10-CM

## 2017-01-03 DIAGNOSIS — J189 Pneumonia, unspecified organism: Secondary | ICD-10-CM | POA: Diagnosis not present

## 2017-01-03 DIAGNOSIS — I110 Hypertensive heart disease with heart failure: Secondary | ICD-10-CM | POA: Diagnosis present

## 2017-01-03 DIAGNOSIS — R51 Headache: Secondary | ICD-10-CM

## 2017-01-03 DIAGNOSIS — R069 Unspecified abnormalities of breathing: Secondary | ICD-10-CM | POA: Diagnosis not present

## 2017-01-03 DIAGNOSIS — E43 Unspecified severe protein-calorie malnutrition: Secondary | ICD-10-CM | POA: Diagnosis not present

## 2017-01-03 DIAGNOSIS — F329 Major depressive disorder, single episode, unspecified: Secondary | ICD-10-CM | POA: Diagnosis present

## 2017-01-03 DIAGNOSIS — I509 Heart failure, unspecified: Secondary | ICD-10-CM | POA: Diagnosis present

## 2017-01-03 DIAGNOSIS — I252 Old myocardial infarction: Secondary | ICD-10-CM

## 2017-01-03 DIAGNOSIS — F411 Generalized anxiety disorder: Secondary | ICD-10-CM | POA: Diagnosis present

## 2017-01-03 DIAGNOSIS — K219 Gastro-esophageal reflux disease without esophagitis: Secondary | ICD-10-CM | POA: Diagnosis present

## 2017-01-03 DIAGNOSIS — J9601 Acute respiratory failure with hypoxia: Secondary | ICD-10-CM | POA: Diagnosis present

## 2017-01-03 DIAGNOSIS — Z7982 Long term (current) use of aspirin: Secondary | ICD-10-CM

## 2017-01-03 DIAGNOSIS — E0781 Sick-euthyroid syndrome: Secondary | ICD-10-CM | POA: Diagnosis present

## 2017-01-03 DIAGNOSIS — R739 Hyperglycemia, unspecified: Secondary | ICD-10-CM | POA: Diagnosis present

## 2017-01-03 DIAGNOSIS — T380X5A Adverse effect of glucocorticoids and synthetic analogues, initial encounter: Secondary | ICD-10-CM | POA: Diagnosis present

## 2017-01-03 DIAGNOSIS — I10 Essential (primary) hypertension: Secondary | ICD-10-CM | POA: Diagnosis present

## 2017-01-03 DIAGNOSIS — Z87891 Personal history of nicotine dependence: Secondary | ICD-10-CM

## 2017-01-03 LAB — D-DIMER, QUANTITATIVE (NOT AT ARMC): D DIMER QUANT: 1.32 ug{FEU}/mL — AB (ref 0.00–0.50)

## 2017-01-03 LAB — I-STAT CHEM 8, ED
BUN: 6 mg/dL (ref 6–20)
CALCIUM ION: 1.08 mmol/L — AB (ref 1.15–1.40)
CHLORIDE: 94 mmol/L — AB (ref 101–111)
Creatinine, Ser: 0.6 mg/dL (ref 0.44–1.00)
GLUCOSE: 138 mg/dL — AB (ref 65–99)
HCT: 49 % — ABNORMAL HIGH (ref 36.0–46.0)
Hemoglobin: 16.7 g/dL — ABNORMAL HIGH (ref 12.0–15.0)
POTASSIUM: 3.6 mmol/L (ref 3.5–5.1)
SODIUM: 141 mmol/L (ref 135–145)
TCO2: 38 mmol/L (ref 0–100)

## 2017-01-03 LAB — CBC WITH DIFFERENTIAL/PLATELET
BASOS ABS: 0 10*3/uL (ref 0.0–0.1)
Basophils Relative: 0 %
Eosinophils Absolute: 0.1 10*3/uL (ref 0.0–0.7)
Eosinophils Relative: 1 %
HCT: 41.1 % (ref 36.0–46.0)
HEMOGLOBIN: 13.5 g/dL (ref 12.0–15.0)
LYMPHS ABS: 3.2 10*3/uL (ref 0.7–4.0)
Lymphocytes Relative: 35 %
MCH: 32.3 pg (ref 26.0–34.0)
MCHC: 32.8 g/dL (ref 30.0–36.0)
MCV: 98.3 fL (ref 78.0–100.0)
Monocytes Absolute: 1.2 10*3/uL — ABNORMAL HIGH (ref 0.1–1.0)
Monocytes Relative: 13 %
NEUTROS PCT: 51 %
Neutro Abs: 4.7 10*3/uL (ref 1.7–7.7)
Platelets: 522 10*3/uL — ABNORMAL HIGH (ref 150–400)
RBC: 4.18 MIL/uL (ref 3.87–5.11)
RDW: 11.8 % (ref 11.5–15.5)
WBC: 9.3 10*3/uL (ref 4.0–10.5)

## 2017-01-03 LAB — I-STAT TROPONIN, ED: Troponin i, poc: 0 ng/mL (ref 0.00–0.08)

## 2017-01-03 LAB — I-STAT VENOUS BLOOD GAS, ED
Acid-Base Excess: 11 mmol/L — ABNORMAL HIGH (ref 0.0–2.0)
Bicarbonate: 39.7 mmol/L — ABNORMAL HIGH (ref 20.0–28.0)
O2 SAT: 87 %
PCO2 VEN: 71.9 mmHg — AB (ref 44.0–60.0)
PH VEN: 7.349 (ref 7.250–7.430)
TCO2: 42 mmol/L (ref 0–100)
pO2, Ven: 59 mmHg — ABNORMAL HIGH (ref 32.0–45.0)

## 2017-01-03 LAB — INFLUENZA PANEL BY PCR (TYPE A & B)
INFLAPCR: NEGATIVE
Influenza B By PCR: NEGATIVE

## 2017-01-03 LAB — BRAIN NATRIURETIC PEPTIDE: B NATRIURETIC PEPTIDE 5: 56.5 pg/mL (ref 0.0–100.0)

## 2017-01-03 MED ORDER — PREDNISONE 20 MG PO TABS
60.0000 mg | ORAL_TABLET | Freq: Once | ORAL | Status: AC
  Administered 2017-01-03: 60 mg via ORAL
  Filled 2017-01-03: qty 3

## 2017-01-03 MED ORDER — NAPROXEN 250 MG PO TABS
375.0000 mg | ORAL_TABLET | Freq: Once | ORAL | Status: AC
  Administered 2017-01-03: 375 mg via ORAL
  Filled 2017-01-03: qty 2

## 2017-01-03 MED ORDER — LORAZEPAM 2 MG/ML IJ SOLN
1.0000 mg | Freq: Once | INTRAMUSCULAR | Status: AC
  Administered 2017-01-03: 1 mg via INTRAVENOUS
  Filled 2017-01-03: qty 1

## 2017-01-03 MED ORDER — ACETAMINOPHEN 325 MG PO TABS
650.0000 mg | ORAL_TABLET | Freq: Once | ORAL | Status: AC
Start: 2017-01-03 — End: 2017-01-03
  Administered 2017-01-03: 650 mg via ORAL
  Filled 2017-01-03: qty 2

## 2017-01-03 MED ORDER — IOPAMIDOL (ISOVUE-370) INJECTION 76%
INTRAVENOUS | Status: AC
  Administered 2017-01-03: 60 mL
  Filled 2017-01-03: qty 100

## 2017-01-03 MED ORDER — SODIUM CHLORIDE 0.9 % IV BOLUS (SEPSIS)
1000.0000 mL | Freq: Once | INTRAVENOUS | Status: AC
  Administered 2017-01-03: 1000 mL via INTRAVENOUS

## 2017-01-03 NOTE — ED Provider Notes (Signed)
Complains of shortness of breath, and cough productive of yellow sputum for the past 3 days. Maximum temperature 100.2. Treated herself with Tylenol this morning 11 AM EMS treated patient with albuterol nebulized treatment while in route. On exam alert speaks in  paragraphs. No respiratory distress. Heart tachycardic regular rhythm lungs clear auscultation abdomen nondistended extremities without edema   Orlie Dakin, MD 01/04/17 [phone]

## 2017-01-03 NOTE — ED Provider Notes (Addendum)
[facility] DEPT Provider Note   CSN: [id number] Arrival date & time:        History   Chief Complaint Chief Complaint  Patient presents with  . Shortness of Breath    HPI Megan [name] is a 66 y.o. female.  HPI  Patient states that for the last few days she's been having a cough and temperature up to 99-100 but no temperature above 100.4. She does have a history of COPD and states that she has been wheezing more. Today was her daughter's birthday and she was in her usual state of health when she suddenly became more short of breath. She called EMS who initiated transport and found her in no acute distress. She was having increasing difficulty breathing and they offered a nonrebreather but patient could not tolerate it due to claustrophobia and was asking for fan. She was sating 98-100% the entire time but had decreased breath sounds and some minimal wheezing so they put her on a DuoNeb and transported her to the emergency department. In route, she became increasingly agitated and to Asking for a fan despite nasal cannula at baseline oxygen (uses 2 L at home) and patient satting 100%. Patient states she has a history of anxiety and states that she feels that she is having an anxiety attack. She denies any chest pain, history of PE or DVT, history of cancer, hemoptysis, chest pain. History limited as patient is very agitated  Attempted to call daughter at [phone] - no answer Attempted to call home - no answer  Past Medical History:  Diagnosis Date  . Anemia   . Anxiety state 08/20/2015  . CAP (community acquired pneumonia)   . CHF (congestive heart failure) ([place])   . COPD (chronic obstructive pulmonary disease) ([place])   . Depression   . Essential hypertension 08/19/2015  . GERD (gastroesophageal reflux disease)   . Headache   . History of hiatal hernia   . Myocardial infarction   . Shortness of breath dyspnea     Patient Active Problem List   Diagnosis Date Noted  .  Acute upper respiratory infection 12/22/2016  . Hair loss 06/22/2016  . Rash 06/22/2016  . Pre-operative clearance 05/19/2016  . Mixed headache 03/19/2016  . COPD (chronic obstructive pulmonary disease) ([place]) 03/19/2016  . CAP (community acquired pneumonia)   . Acute respiratory failure with hypoxia and hypercarbia ([place]) 02/12/2016  . Esophageal reflux 09/01/2015  . Goals of care, counseling/discussion   . Palliative care encounter   . Acute exacerbation of chronic obstructive pulmonary disease (COPD) ([place]) 08/26/2015  . Anxiety state 08/20/2015  . Chronic post-traumatic stress disorder (PTSD) 08/20/2015  . Multiple pulmonary nodules determined by computed tomography of lung 08/19/2015  . Essential hypertension 08/19/2015  . COPD exacerbation ([place]) 08/19/2015  . Protein-calorie malnutrition, severe 08/19/2015    Past Surgical History:  Procedure Laterality Date  . CARDIAC CATHETERIZATION    . CARDIAC CATHETERIZATION N/A 08/26/2015   Procedure: Left Heart Cath and Coronary Angiography;  Surgeon: Jettie Booze, MD;  Location: [facility];  Service: Cardiovascular;  Laterality: N/A;    OB History    No data available       Home Medications    Prior to Admission medications   Medication Sig Start Date End Date Taking? Authorizing Provider  acetaminophen (TYLENOL) 500 MG tablet Take 500 mg by mouth every 6 (six) hours as needed for mild pain.   Yes Historical Provider, MD  ALPRAZolam Duanne Moron) 0.25 MG tablet Take  1 tablet (0.25 mg total) by mouth 2 (two) times daily as needed for anxiety. 12/22/16  Yes Golden Circle, FNP  aspirin EC 81 MG tablet Take 81 mg by mouth daily.   Yes Historical Provider, MD  azithromycin (ZITHROMAX) 250 MG tablet Take 2 tablets by mouth for 1 day and 1 tablet by mouth daily for 4 days. 12/22/16  Yes Golden Circle, FNP  butalbital-acetaminophen-caffeine (FIORICET, ESGIC) [phone] MG tablet Take 1 tablet by mouth 2 (two) times daily as  needed for headache. 12/22/16  Yes Golden Circle, FNP  Diclofenac Sodium (PENNSAID) 2 % SOLN Place 1 application onto the skin 2 (two) times daily as needed. Apply to right shoulder Patient taking differently: Place 1 application onto the skin 2 (two) times daily as needed (pain). Apply to right shoulder 09/17/16  Yes Charlene Brooke Nche, NP  docusate sodium (COLACE) 100 MG capsule Take 100 mg by mouth daily as needed for mild constipation.   Yes Historical Provider, MD  Fluticasone-Salmeterol (ADVAIR) 500-50 MCG/DOSE AEPB Inhale 1 puff into the lungs 2 (two) times daily. 04/19/16  Yes Collene Gobble, MD  INCRUSE ELLIPTA 62.5 MCG/INH AEPB INHALE 1 PUFF INTO THE LUNGS DAILY 11/29/16  Yes Magdalen Spatz, NP  ipratropium-albuterol (DUONEB) 0.5-2.5 (3) MG/3ML SOLN Take 3 mLs by nebulization every 6 (six) hours as needed (shortness of breath). 04/19/16  Yes Collene Gobble, MD  lidocaine (LIDODERM) 5 % Place 0.25 patches onto the skin daily as needed (pain). Remove & Discard patch within 12 hours or as directed by MD   Yes Historical Provider, MD  MELATONIN PO Take 1 tablet by mouth at bedtime as needed (sleep).   Yes Historical Provider, MD  Polyethyl Glycol-Propyl Glycol (SYSTANE OP) Place 1 drop into both eyes every 6 (six) hours as needed (dry eyes). Reported on 05/19/2016   Yes Historical Provider, MD  promethazine (PHENERGAN) 12.5 MG tablet Take 12.5 mg by mouth every 6 (six) hours as needed for nausea or vomiting. Reported on 05/19/2016   Yes Historical Provider, MD  VENTOLIN HFA 108 (90 Base) MCG/ACT inhaler INHALE 1 PUFF INTO THE LUNGS EVERY 4 HOURS AS NEEDED FOR WHEEZING OR SHORTNESS OF BREATH 01/03/17  Yes Magdalen Spatz, NP    Family History Family History  Problem Relation Age of Onset  . Cirrhosis Mother   . Depression Mother   . Heart disease Father     Social History Social History  Substance Use Topics  . Smoking status: Former Smoker    Packs/day: 1.00    Years: 30.00    Types:  Cigarettes    Quit date: 08/18/2000  . Smokeless tobacco: Never Used  . Alcohol use No     Allergies   Penicillins   Review of Systems Review of Systems  Constitutional: Negative for fever.  Respiratory: Positive for cough and shortness of breath.   Cardiovascular: Positive for palpitations. Negative for chest pain and leg swelling.  Allergic/Immunologic: Negative for immunocompromised state.  All other systems reviewed and are negative.    Physical Exam Updated Vital Signs BP 145/85 (BP Location: Right Arm)   Pulse (!) 137   Temp 97.8 F (36.6 C) (Oral)   Resp (!) 27   Ht 5' 3.5" (1.613 m)   Wt 41.3 kg   SpO2 100%   BMI 15.87 kg/m   Physical Exam  Constitutional: She appears well-developed and well-nourished. She appears distressed.  HENT:  Head: Normocephalic and atraumatic.  Eyes: Conjunctivae are  normal.  Neck: Neck supple.  Cardiovascular: Normal rate and regular rhythm.   No murmur heard. Pulmonary/Chest: Breath sounds normal. She is in respiratory distress. She has no wheezes. She exhibits no tenderness.  tachypnic  Abdominal: Soft. There is no tenderness.  Musculoskeletal: She exhibits no edema.  Neurological: She is alert.  Skin: Skin is warm and dry. She is not diaphoretic.  Psychiatric:  Appears extremely anxious, stating she needs a fan to blow air in her mouth (despite having an oxygen mask by EMS, then O2 by [facility] and sating 100%)  Nursing note and vitals reviewed.    ED Treatments / Results  Labs (all labs ordered are listed, but only abnormal results are displayed) Labs Reviewed  CBC WITH DIFFERENTIAL/PLATELET - Abnormal; Notable for the following:       Result Value   Platelets 522 (*)    Monocytes Absolute 1.2 (*)    All other components within normal limits  D-DIMER, QUANTITATIVE (NOT AT [facility]) - Abnormal; Notable for the following:    D-Dimer, Quant 1.32 (*)    All other components within normal limits  I-STAT CHEM 8, ED - Abnormal;  Notable for the following:    Chloride 94 (*)    Glucose, Bld 138 (*)    Calcium, Ion 1.08 (*)    Hemoglobin 16.7 (*)    HCT 49.0 (*)    All other components within normal limits  I-STAT VENOUS BLOOD GAS, ED - Abnormal; Notable for the following:    pCO2, Ven 71.9 (*)    pO2, Ven 59.0 (*)    Bicarbonate 39.7 (*)    Acid-Base Excess 11.0 (*)    All other components within normal limits  CULTURE, BLOOD (ROUTINE X 2)  CULTURE, BLOOD (ROUTINE X 2)  BRAIN NATRIURETIC PEPTIDE  INFLUENZA PANEL BY PCR (TYPE A & B)  BLOOD GAS, ARTERIAL  I-STAT TROPOININ, ED    EKG  EKG Interpretation  Date/Time:  Monday January 03 2017 17:15:42 EST Ventricular Rate:  144 PR Interval:    QRS Duration: 94 QT Interval:  277 QTC Calculation: 429 R Axis:   84 Text Interpretation:  Sinus tachycardia Borderline right axis deviation RSR' in V1 or V2, probably normal variant Probable left ventricular hypertrophy Nonspecific T abnormalities, lateral leads Artifact in lead(s) II aVR aVF V1 V2 V4 V5 V6 No significant change since last tracing Confirmed by Winfred Leeds  MD, SAM [phone]) on 01/03/2017 5:21:04 PM       Radiology Dg Chest 2 View  Result Date: 01/03/2017 CLINICAL DATA:  66 year old female with shortness of Breath, no improvement with home breathing treatments. Decreased breath sounds on auscultation. Initial encounter. EXAM: CHEST  2 VIEW COMPARISON:  02/25/2016 and earlier. FINDINGS: Upright AP and lateral views of the chest. Chronic pulmonary hyperinflation. Larger lung volumes compared to 2017. Mediastinal contours are stable and normal aside from cephalad mild hilar retraction. No pneumothorax or pulmonary edema. No pleural effusions suspected. Mildly increased chronic pulmonary interstitial opacity. Incidental left nipple shadow. No confluent pulmonary opacity. No acute osseous abnormality identified. Negative visible bowel gas pattern. IMPRESSION: Chronic pulmonary hyperinflation with increased lung  volumes and mildly increased interstitial opacity compared to 2017. Consider viral or atypical respiratory infection. No pleural effusion. Electronically Signed   By: Genevie Ann M.D.   On: 01/03/2017 18:06   Ct Angio Chest Pe W Or Wo Contrast  Result Date: 01/03/2017 CLINICAL DATA:  Dyspnea and altered responsiveness. EXAM: CT ANGIOGRAPHY CHEST WITH CONTRAST TECHNIQUE: Multidetector CT imaging  of the chest was performed using the standard protocol during bolus administration of intravenous contrast. Multiplanar CT image reconstructions and MIPs were obtained to evaluate the vascular anatomy. CONTRAST:  60 mL Isovue 370 intravenous COMPARISON:  Radiographs 01/03/2017, CT 08/27/2015 FINDINGS: Cardiovascular: Satisfactory opacification of the pulmonary arteries to the segmental level. No evidence of pulmonary embolism. Normal heart size. No pericardial effusion. Mediastinum/Nodes: No enlarged mediastinal, hilar, or axillary lymph nodes. Thyroid gland, trachea, and esophagus demonstrate no significant findings. Lungs/Pleura: Marked hyperinflation. Severe centrilobular emphysematous disease. Scattered stable linear and nodular opacities at the periphery of both lungs. New patchy consolidation in the lateral right lower lobe base and in the central-lateral left lung base, suspicious for pneumonia. Airways are patent. No pleural effusion. Upper Abdomen: Small hiatal hernia.  No acute findings. Musculoskeletal: No significant skeletal lesion. Review of the MIP images confirms the above findings. IMPRESSION: 1. Negative for acute pulmonary embolism. 2. Patchy consolidation in both lung bases, right greater than left, superimposed on severe emphysematous disease. This may represent pneumonia. 3. Small hiatal hernia. Electronically Signed   By: Andreas Newport M.D.   On: 01/03/2017 23:39    Procedures Procedures (including critical care time)  Medications Ordered in ED Medications  levofloxacin (LEVAQUIN) IVPB 750 mg  (not administered)  LORazepam (ATIVAN) injection 1 mg (1 mg Intravenous Given 01/03/17 1734)  predniSONE (DELTASONE) tablet 60 mg (60 mg Oral Given 01/03/17 1900)  acetaminophen (TYLENOL) tablet 650 mg (650 mg Oral Given 01/03/17 1859)  sodium chloride 0.9 % bolus 1,000 mL (1,000 mLs Intravenous New Bag/Given 01/03/17 1900)  naproxen (NAPROSYN) tablet 375 mg (375 mg Oral Given 01/03/17 1949)  iopamidol (ISOVUE-370) 76 % injection (60 mLs  Contrast Given 01/03/17 2306)     Initial Impression / Assessment and Plan / ED Course  I have reviewed the triage vital signs and the nursing notes.  Pertinent labs & imaging results that were available during my care of the patient were reviewed by me and considered in my medical decision making (see chart for details).     Patient arrives very tachycardic stating that she is short of breath and having her anxiety attack I'm concerned that she may be having a COPD exacerbation so breathing treatment administered as well as prednisone However, continued tachycardic so CTPA obtained revealing atypical CAP vs viral etiology -  Levofloxacin ordered Labs reviewed, vBG wo acute CO2 retention, flu negative Will admit  1:37a - ABG ordered by IM, pH low but assessed pt and AOx3, RR 14-19, sating 98% on 2L (baseline) - no bipap indicated.   Final Clinical Impressions(s) / ED Diagnoses   Final diagnoses:  SOB (shortness of breath)    New Prescriptions New Prescriptions   No medications on file       Karma Greaser, MD 01/04/17 0032    Orlie Dakin, MD 01/04/17 [id number]    Karma Greaser, MD 01/04/17 [place], MD 01/06/17 1010

## 2017-01-03 NOTE — ED Notes (Signed)
IV Team at the bedside. 

## 2017-01-03 NOTE — ED Notes (Signed)
Patient transported to CT 

## 2017-01-03 NOTE — ED Triage Notes (Addendum)
Pt here via [facility] for sob.  Hx of copd and has been taking breathing tx at home with no improvements.  Given 10 mg albuterol and 0.5 atrovent en-route.  [facility] states vs stable.  Pt became highly anxious upon arrival and began panicking.  bs diminished with a slight wheeze.

## 2017-01-03 NOTE — ED Notes (Signed)
Pt back from CT using bedpan

## 2017-01-04 ENCOUNTER — Telehealth: Payer: Self-pay | Admitting: Emergency Medicine

## 2017-01-04 ENCOUNTER — Encounter (HOSPITAL_COMMUNITY): Payer: Self-pay | Admitting: Internal Medicine

## 2017-01-04 DIAGNOSIS — R0602 Shortness of breath: Secondary | ICD-10-CM | POA: Diagnosis not present

## 2017-01-04 DIAGNOSIS — T380X5A Adverse effect of glucocorticoids and synthetic analogues, initial encounter: Secondary | ICD-10-CM | POA: Diagnosis present

## 2017-01-04 DIAGNOSIS — Z9981 Dependence on supplemental oxygen: Secondary | ICD-10-CM | POA: Diagnosis not present

## 2017-01-04 DIAGNOSIS — E43 Unspecified severe protein-calorie malnutrition: Secondary | ICD-10-CM | POA: Diagnosis not present

## 2017-01-04 DIAGNOSIS — J440 Chronic obstructive pulmonary disease with acute lower respiratory infection: Secondary | ICD-10-CM | POA: Diagnosis present

## 2017-01-04 DIAGNOSIS — I509 Heart failure, unspecified: Secondary | ICD-10-CM | POA: Diagnosis not present

## 2017-01-04 DIAGNOSIS — Z7982 Long term (current) use of aspirin: Secondary | ICD-10-CM | POA: Diagnosis not present

## 2017-01-04 DIAGNOSIS — J441 Chronic obstructive pulmonary disease with (acute) exacerbation: Secondary | ICD-10-CM | POA: Diagnosis not present

## 2017-01-04 DIAGNOSIS — R739 Hyperglycemia, unspecified: Secondary | ICD-10-CM | POA: Diagnosis not present

## 2017-01-04 DIAGNOSIS — I10 Essential (primary) hypertension: Secondary | ICD-10-CM | POA: Diagnosis not present

## 2017-01-04 DIAGNOSIS — J9602 Acute respiratory failure with hypercapnia: Secondary | ICD-10-CM | POA: Diagnosis present

## 2017-01-04 DIAGNOSIS — F411 Generalized anxiety disorder: Secondary | ICD-10-CM | POA: Diagnosis present

## 2017-01-04 DIAGNOSIS — Z681 Body mass index (BMI) 19 or less, adult: Secondary | ICD-10-CM | POA: Diagnosis not present

## 2017-01-04 DIAGNOSIS — Z87891 Personal history of nicotine dependence: Secondary | ICD-10-CM | POA: Diagnosis not present

## 2017-01-04 DIAGNOSIS — F329 Major depressive disorder, single episode, unspecified: Secondary | ICD-10-CM | POA: Diagnosis present

## 2017-01-04 DIAGNOSIS — K219 Gastro-esophageal reflux disease without esophagitis: Secondary | ICD-10-CM

## 2017-01-04 DIAGNOSIS — I110 Hypertensive heart disease with heart failure: Secondary | ICD-10-CM | POA: Diagnosis present

## 2017-01-04 DIAGNOSIS — R000 Tachycardia, unspecified: Secondary | ICD-10-CM | POA: Diagnosis not present

## 2017-01-04 DIAGNOSIS — E0781 Sick-euthyroid syndrome: Secondary | ICD-10-CM | POA: Diagnosis present

## 2017-01-04 DIAGNOSIS — I252 Old myocardial infarction: Secondary | ICD-10-CM | POA: Diagnosis not present

## 2017-01-04 DIAGNOSIS — J9601 Acute respiratory failure with hypoxia: Secondary | ICD-10-CM | POA: Diagnosis not present

## 2017-01-04 DIAGNOSIS — R51 Headache: Secondary | ICD-10-CM | POA: Diagnosis not present

## 2017-01-04 DIAGNOSIS — J189 Pneumonia, unspecified organism: Secondary | ICD-10-CM | POA: Diagnosis not present

## 2017-01-04 DIAGNOSIS — Z79899 Other long term (current) drug therapy: Secondary | ICD-10-CM | POA: Diagnosis not present

## 2017-01-04 LAB — BASIC METABOLIC PANEL
Anion gap: 10 (ref 5–15)
BUN: <5 mg/dL — ABNORMAL LOW (ref 6–20)
CALCIUM: 8.3 mg/dL — AB (ref 8.9–10.3)
CHLORIDE: 93 mmol/L — AB (ref 101–111)
CO2: 34 mmol/L — ABNORMAL HIGH (ref 22–32)
CREATININE: 0.53 mg/dL (ref 0.44–1.00)
Glucose, Bld: 230 mg/dL — ABNORMAL HIGH (ref 65–99)
Potassium: 3.7 mmol/L (ref 3.5–5.1)
SODIUM: 137 mmol/L (ref 135–145)

## 2017-01-04 LAB — BLOOD GAS, ARTERIAL
Acid-Base Excess: 10.2 mmol/L — ABNORMAL HIGH (ref 0.0–2.0)
Bicarbonate: 37 mmol/L — ABNORMAL HIGH (ref 20.0–28.0)
DRAWN BY: 398981
O2 Content: 2 L/min
O2 Saturation: 99 %
PATIENT TEMPERATURE: 97.8
PH ART: 7.285 — AB (ref 7.350–7.450)
pCO2 arterial: 79.8 mmHg (ref 32.0–48.0)
pO2, Arterial: 154 mmHg — ABNORMAL HIGH (ref 83.0–108.0)

## 2017-01-04 LAB — HIV ANTIBODY (ROUTINE TESTING W REFLEX): HIV SCREEN 4TH GENERATION: NONREACTIVE

## 2017-01-04 LAB — CBC
HCT: 38.1 % (ref 36.0–46.0)
Hemoglobin: 12.5 g/dL (ref 12.0–15.0)
MCH: 32.1 pg (ref 26.0–34.0)
MCHC: 32.8 g/dL (ref 30.0–36.0)
MCV: 97.7 fL (ref 78.0–100.0)
PLATELETS: 421 10*3/uL — AB (ref 150–400)
RBC: 3.9 MIL/uL (ref 3.87–5.11)
RDW: 11.7 % (ref 11.5–15.5)
WBC: 7.8 10*3/uL (ref 4.0–10.5)

## 2017-01-04 LAB — STREP PNEUMONIAE URINARY ANTIGEN: STREP PNEUMO URINARY ANTIGEN: NEGATIVE

## 2017-01-04 LAB — TSH: TSH: 0.173 u[IU]/mL — AB (ref 0.350–4.500)

## 2017-01-04 LAB — T4, FREE: FREE T4: 1.02 ng/dL (ref 0.61–1.12)

## 2017-01-04 LAB — MRSA PCR SCREENING: MRSA by PCR: POSITIVE — AB

## 2017-01-04 MED ORDER — ALPRAZOLAM 0.25 MG PO TABS
0.2500 mg | ORAL_TABLET | Freq: Once | ORAL | Status: AC
  Administered 2017-01-04: 0.25 mg via ORAL
  Filled 2017-01-04: qty 1

## 2017-01-04 MED ORDER — ACETAMINOPHEN 650 MG RE SUPP: 650.0000 mg | Freq: Four times a day (QID) | RECTAL | Status: DC | PRN

## 2017-01-04 MED ORDER — METHYLPREDNISOLONE SODIUM SUCC 40 MG IJ SOLR
40.0000 mg | Freq: Two times a day (BID) | INTRAMUSCULAR | Status: DC
  Administered 2017-01-04: 40 mg via INTRAVENOUS
  Filled 2017-01-04: qty 1

## 2017-01-04 MED ORDER — ACETAMINOPHEN 325 MG PO TABS
650.0000 mg | ORAL_TABLET | Freq: Four times a day (QID) | ORAL | Status: DC | PRN
  Administered 2017-01-10: 650 mg via ORAL
  Filled 2017-01-04 (×2): qty 2

## 2017-01-04 MED ORDER — IPRATROPIUM-ALBUTEROL 0.5-2.5 (3) MG/3ML IN SOLN
3.0000 mL | RESPIRATORY_TRACT | Status: DC
  Administered 2017-01-04: 3 mL via RESPIRATORY_TRACT
  Filled 2017-01-04: qty 3

## 2017-01-04 MED ORDER — LEVOFLOXACIN IN D5W 750 MG/150ML IV SOLN: 750.0000 mg | INTRAVENOUS | Status: DC

## 2017-01-04 MED ORDER — IPRATROPIUM BROMIDE 0.02 % IN SOLN: 0.5000 mg | RESPIRATORY_TRACT | Status: DC | PRN

## 2017-01-04 MED ORDER — BUTALBITAL-APAP-CAFFEINE 50-325-40 MG PO TABS
1.0000 | ORAL_TABLET | Freq: Two times a day (BID) | ORAL | Status: DC | PRN
  Administered 2017-01-04 – 2017-01-11 (×7): 1 via ORAL
  Filled 2017-01-04 (×8): qty 1

## 2017-01-04 MED ORDER — TRAMADOL HCL 50 MG PO TABS
50.0000 mg | ORAL_TABLET | Freq: Four times a day (QID) | ORAL | Status: DC | PRN
  Administered 2017-01-04 – 2017-01-10 (×5): 50 mg via ORAL
  Filled 2017-01-04 (×6): qty 1

## 2017-01-04 MED ORDER — ALPRAZOLAM 0.25 MG PO TABS
0.2500 mg | ORAL_TABLET | Freq: Two times a day (BID) | ORAL | Status: DC | PRN
  Administered 2017-01-04 – 2017-01-06 (×3): 0.25 mg via ORAL
  Filled 2017-01-04 (×3): qty 1

## 2017-01-04 MED ORDER — LORATADINE 10 MG PO TABS
10.0000 mg | ORAL_TABLET | Freq: Every day | ORAL | Status: DC
  Administered 2017-01-04 – 2017-01-11 (×8): 10 mg via ORAL
  Filled 2017-01-04 (×8): qty 1

## 2017-01-04 MED ORDER — IPRATROPIUM BROMIDE 0.02 % IN SOLN
0.5000 mg | Freq: Four times a day (QID) | RESPIRATORY_TRACT | Status: DC
  Administered 2017-01-05 – 2017-01-06 (×5): 0.5 mg via RESPIRATORY_TRACT
  Filled 2017-01-04 (×6): qty 2.5

## 2017-01-04 MED ORDER — ZOLPIDEM TARTRATE 5 MG PO TABS
5.0000 mg | ORAL_TABLET | Freq: Once | ORAL | Status: AC
  Administered 2017-01-04: 5 mg via ORAL
  Filled 2017-01-04: qty 1

## 2017-01-04 MED ORDER — LEVALBUTEROL HCL 0.63 MG/3ML IN NEBU
0.6300 mg | INHALATION_SOLUTION | RESPIRATORY_TRACT | Status: DC
  Administered 2017-01-04 (×3): 0.63 mg via RESPIRATORY_TRACT
  Filled 2017-01-04 (×3): qty 3

## 2017-01-04 MED ORDER — ENSURE ENLIVE PO LIQD
237.0000 mL | Freq: Two times a day (BID) | ORAL | Status: DC
  Administered 2017-01-04 – 2017-01-05 (×2): 237 mL via ORAL

## 2017-01-04 MED ORDER — METHYLPREDNISOLONE SODIUM SUCC 125 MG IJ SOLR
80.0000 mg | Freq: Three times a day (TID) | INTRAMUSCULAR | Status: DC
  Administered 2017-01-04 – 2017-01-06 (×7): 80 mg via INTRAVENOUS
  Filled 2017-01-04 (×7): qty 2

## 2017-01-04 MED ORDER — ONDANSETRON HCL 4 MG PO TABS: 4.0000 mg | ORAL_TABLET | Freq: Four times a day (QID) | ORAL | Status: DC | PRN

## 2017-01-04 MED ORDER — LEVOFLOXACIN IN D5W 750 MG/150ML IV SOLN
750.0000 mg | Freq: Once | INTRAVENOUS | Status: AC
  Administered 2017-01-04: 750 mg via INTRAVENOUS
  Filled 2017-01-04: qty 150

## 2017-01-04 MED ORDER — BUDESONIDE 0.5 MG/2ML IN SUSP
0.5000 mg | Freq: Two times a day (BID) | RESPIRATORY_TRACT | Status: DC
  Administered 2017-01-04 – 2017-01-11 (×14): 0.5 mg via RESPIRATORY_TRACT
  Filled 2017-01-04 (×15): qty 2

## 2017-01-04 MED ORDER — ENOXAPARIN SODIUM 30 MG/0.3ML ~~LOC~~ SOLN
30.0000 mg | SUBCUTANEOUS | Status: DC
  Administered 2017-01-04 – 2017-01-11 (×8): 30 mg via SUBCUTANEOUS
  Filled 2017-01-04 (×8): qty 0.3

## 2017-01-04 MED ORDER — BUTALBITAL-APAP-CAFFEINE 50-325-40 MG PO TABS
1.0000 | ORAL_TABLET | Freq: Two times a day (BID) | ORAL | Status: DC | PRN
  Administered 2017-01-04: 1 via ORAL
  Filled 2017-01-04: qty 1

## 2017-01-04 MED ORDER — BUDESONIDE 0.25 MG/2ML IN SUSP
0.2500 mg | Freq: Two times a day (BID) | RESPIRATORY_TRACT | Status: DC
  Administered 2017-01-04: 0.25 mg via RESPIRATORY_TRACT
  Filled 2017-01-04: qty 2

## 2017-01-04 MED ORDER — FLUTICASONE PROPIONATE 50 MCG/ACT NA SUSP
2.0000 | Freq: Every day | NASAL | Status: DC
  Administered 2017-01-04 – 2017-01-11 (×8): 2 via NASAL
  Filled 2017-01-04: qty 16

## 2017-01-04 MED ORDER — ARFORMOTEROL TARTRATE 15 MCG/2ML IN NEBU
15.0000 ug | INHALATION_SOLUTION | Freq: Two times a day (BID) | RESPIRATORY_TRACT | Status: DC
  Administered 2017-01-04 – 2017-01-11 (×15): 15 ug via RESPIRATORY_TRACT
  Filled 2017-01-04 (×16): qty 2

## 2017-01-04 MED ORDER — IPRATROPIUM-ALBUTEROL 0.5-2.5 (3) MG/3ML IN SOLN: 3.0000 mL | RESPIRATORY_TRACT | Status: DC | PRN

## 2017-01-04 MED ORDER — LEVALBUTEROL HCL 0.63 MG/3ML IN NEBU
0.6300 mg | INHALATION_SOLUTION | Freq: Four times a day (QID) | RESPIRATORY_TRACT | Status: DC
  Administered 2017-01-05 – 2017-01-06 (×5): 0.63 mg via RESPIRATORY_TRACT
  Filled 2017-01-04 (×6): qty 3

## 2017-01-04 MED ORDER — LIDOCAINE 5 % EX PTCH
0.2500 | MEDICATED_PATCH | Freq: Every day | CUTANEOUS | Status: DC | PRN
  Filled 2017-01-04: qty 1

## 2017-01-04 MED ORDER — LEVALBUTEROL HCL 0.63 MG/3ML IN NEBU
0.6300 mg | INHALATION_SOLUTION | RESPIRATORY_TRACT | Status: DC | PRN
  Administered 2017-01-05: 0.63 mg via RESPIRATORY_TRACT
  Filled 2017-01-04: qty 3

## 2017-01-04 MED ORDER — IPRATROPIUM BROMIDE 0.02 % IN SOLN
0.5000 mg | RESPIRATORY_TRACT | Status: DC
Start: 2017-01-04 — End: 2017-01-04
  Administered 2017-01-04 (×3): 0.5 mg via RESPIRATORY_TRACT
  Filled 2017-01-04 (×3): qty 2.5

## 2017-01-04 MED ORDER — PANTOPRAZOLE SODIUM 40 MG PO TBEC
40.0000 mg | DELAYED_RELEASE_TABLET | Freq: Every day | ORAL | Status: DC
  Administered 2017-01-04 – 2017-01-10 (×7): 40 mg via ORAL
  Filled 2017-01-04 (×6): qty 1

## 2017-01-04 MED ORDER — ASPIRIN EC 81 MG PO TBEC
81.0000 mg | DELAYED_RELEASE_TABLET | Freq: Every day | ORAL | Status: DC
  Administered 2017-01-04 – 2017-01-11 (×8): 81 mg via ORAL
  Filled 2017-01-04 (×8): qty 1

## 2017-01-04 MED ORDER — ONDANSETRON HCL 4 MG/2ML IJ SOLN
4.0000 mg | Freq: Four times a day (QID) | INTRAMUSCULAR | Status: DC | PRN
  Administered 2017-01-04: 4 mg via INTRAVENOUS
  Filled 2017-01-04: qty 2

## 2017-01-04 MED ORDER — LEVOFLOXACIN IN D5W 750 MG/150ML IV SOLN
750.0000 mg | INTRAVENOUS | Status: DC
  Administered 2017-01-06: 750 mg via INTRAVENOUS
  Filled 2017-01-04: qty 150

## 2017-01-04 NOTE — H&P (Signed)
History and Physical    Megan [name] VPX:[id number] DOB: [birth year] DOA: 01/03/2017  PCP: Mauricio Po, FNP  Patient coming from: Home.  Chief Complaint: Shortness of breath.  HPI: Megan [name] is a 66 y.o. female with severe COPD on home oxygen and anxiety presents to the ER because of shortness of breath. Patient states she has been short of breath for the last 1 week which has progressively gotten worse even at rest. Has been having productive cough. Denies any fever chills or chest pain.   ED Course: In the ER patient is found to be tachycardic short of breath. Patient had CAT scan of the chest which shows possible pneumonia. Patient also was wheezing on exam. Patient was placed on steroids nebulizer and antibiotics. Patient being admitted for further management. ABG shows hypercarbic respiratory failure. But patient at this time not in distress.  Review of Systems: As per HPI, rest all negative.   Past Medical History:  Diagnosis Date  . Anemia   . Anxiety state 08/20/2015  . CAP (community acquired pneumonia)   . CHF (congestive heart failure) ([place])   . COPD (chronic obstructive pulmonary disease) ([place])   . Depression   . Essential hypertension 08/19/2015  . GERD (gastroesophageal reflux disease)   . Headache   . History of hiatal hernia   . Myocardial infarction   . Shortness of breath dyspnea     Past Surgical History:  Procedure Laterality Date  . CARDIAC CATHETERIZATION    . CARDIAC CATHETERIZATION N/A 08/26/2015   Procedure: Left Heart Cath and Coronary Angiography;  Surgeon: Jettie Booze, MD;  Location: [facility];  Service: Cardiovascular;  Laterality: N/A;     reports that she quit smoking about 16 years ago. Her smoking use included Cigarettes. She has a 30.00 pack-year smoking history. She has never used smokeless tobacco. She reports that she does not drink alcohol or use drugs.  Allergies  Allergen Reactions  . Penicillins Rash    Has  patient had a PCN reaction causing immediate rash, facial/tongue/throat swelling, SOB or lightheadedness with hypotension: No Has patient had a PCN reaction causing severe rash involving mucus membranes or skin necrosis:NO Has patient had a PCN reaction that required hospitalization No Has patient had a PCN reaction occurring within the last 10 years:NO If all of the above answers are "NO", then may proceed with Cephalosporin use.    Family History  Problem Relation Age of Onset  . Cirrhosis Mother   . Depression Mother   . Heart disease Father     Prior to Admission medications   Medication Sig Start Date End Date Taking? Authorizing Provider  acetaminophen (TYLENOL) 500 MG tablet Take 500 mg by mouth every 6 (six) hours as needed for mild pain.   Yes Historical Provider, MD  ALPRAZolam (XANAX) 0.25 MG tablet Take 1 tablet (0.25 mg total) by mouth 2 (two) times daily as needed for anxiety. 12/22/16  Yes Golden Circle, FNP  aspirin EC 81 MG tablet Take 81 mg by mouth daily.   Yes Historical Provider, MD  azithromycin (ZITHROMAX) 250 MG tablet Take 2 tablets by mouth for 1 day and 1 tablet by mouth daily for 4 days. 12/22/16  Yes Golden Circle, FNP  butalbital-acetaminophen-caffeine (FIORICET, ESGIC) [phone] MG tablet Take 1 tablet by mouth 2 (two) times daily as needed for headache. 12/22/16  Yes Golden Circle, FNP  Diclofenac Sodium (PENNSAID) 2 % SOLN Place 1 application onto the skin 2 (  two) times daily as needed. Apply to right shoulder Patient taking differently: Place 1 application onto the skin 2 (two) times daily as needed (pain). Apply to right shoulder 09/17/16  Yes Charlene Brooke Nche, NP  docusate sodium (COLACE) 100 MG capsule Take 100 mg by mouth daily as needed for mild constipation.   Yes Historical Provider, MD  Fluticasone-Salmeterol (ADVAIR) 500-50 MCG/DOSE AEPB Inhale 1 puff into the lungs 2 (two) times daily. 04/19/16  Yes Collene Gobble, MD  INCRUSE ELLIPTA 62.5  MCG/INH AEPB INHALE 1 PUFF INTO THE LUNGS DAILY 11/29/16  Yes Magdalen Spatz, NP  ipratropium-albuterol (DUONEB) 0.5-2.5 (3) MG/3ML SOLN Take 3 mLs by nebulization every 6 (six) hours as needed (shortness of breath). 04/19/16  Yes Collene Gobble, MD  lidocaine (LIDODERM) 5 % Place 0.25 patches onto the skin daily as needed (pain). Remove & Discard patch within 12 hours or as directed by MD   Yes Historical Provider, MD  MELATONIN PO Take 1 tablet by mouth at bedtime as needed (sleep).   Yes Historical Provider, MD  Polyethyl Glycol-Propyl Glycol (SYSTANE OP) Place 1 drop into both eyes every 6 (six) hours as needed (dry eyes). Reported on 05/19/2016   Yes Historical Provider, MD  promethazine (PHENERGAN) 12.5 MG tablet Take 12.5 mg by mouth every 6 (six) hours as needed for nausea or vomiting. Reported on 05/19/2016   Yes Historical Provider, MD  VENTOLIN HFA 108 (90 Base) MCG/ACT inhaler INHALE 1 PUFF INTO THE LUNGS EVERY 4 HOURS AS NEEDED FOR WHEEZING OR SHORTNESS OF BREATH 01/03/17  Yes Magdalen Spatz, NP    Physical Exam: Vitals:   01/04/17 0030 01/04/17 0045 01/04/17 0058 01/04/17 0100  BP: 120/76 155/89 155/89 120/66  Pulse: (!) 131 (!) 125 (!) 125 (!) 121  Resp: '21 19 23 24  '$ Temp:   97.8 F (36.6 C)   TempSrc:   Axillary   SpO2: 99% 100% 99% 98%  Weight:      Height:          Constitutional: Moderately built and poorly nourished. Vitals:   01/04/17 0030 01/04/17 0045 01/04/17 0058 01/04/17 0100  BP: 120/76 155/89 155/89 120/66  Pulse: (!) 131 (!) 125 (!) 125 (!) 121  Resp: '21 19 23 24  '$ Temp:   97.8 F (36.6 C)   TempSrc:   Axillary   SpO2: 99% 100% 99% 98%  Weight:      Height:       Eyes: Anicteric. No pallor. ENMT: No discharge from the ears eyes nose or mouth. Neck: No JVD appreciated no mass felt. Respiratory: Chest appears tight. Cardiovascular: S1 and S2 heard no murmurs appreciated. Abdomen: Soft nontender bowel sounds present. No guarding or  rigidity. Musculoskeletal: No edema. No joint effusion. Skin: No rash. Skin appears warm. Neurologic: Alert awake oriented to time place and person. Moves all extremities. Psychiatric: Affect is normal. Normal affect.   Labs on Admission: I have personally reviewed following labs and imaging studies  CBC:  Recent Labs Lab 01/03/17 1731 01/03/17 1743  WBC 9.3  --   NEUTROABS 4.7  --   HGB 13.5 16.7*  HCT 41.1 49.0*  MCV 98.3  --   PLT 522*  --    Basic Metabolic Panel:  Recent Labs Lab 01/03/17 1743  NA 141  K 3.6  CL 94*  GLUCOSE 138*  BUN 6  CREATININE 0.60   GFR: Estimated Creatinine Clearance: 46.9 mL/min (by C-G formula based on SCr of 0.6  mg/dL). Liver Function Tests: No results for input(s): AST, ALT, ALKPHOS, BILITOT, PROT, ALBUMIN in the last 168 hours. No results for input(s): LIPASE, AMYLASE in the last 168 hours. No results for input(s): AMMONIA in the last 168 hours. Coagulation Profile: No results for input(s): INR, PROTIME in the last 168 hours. Cardiac Enzymes: No results for input(s): CKTOTAL, CKMB, CKMBINDEX, TROPONINI in the last 168 hours. BNP (last 3 results) No results for input(s): PROBNP in the last 8760 hours. HbA1C: No results for input(s): HGBA1C in the last 72 hours. CBG: No results for input(s): GLUCAP in the last 168 hours. Lipid Profile: No results for input(s): CHOL, HDL, LDLCALC, TRIG, CHOLHDL, LDLDIRECT in the last 72 hours. Thyroid Function Tests: No results for input(s): TSH, T4TOTAL, FREET4, T3FREE, THYROIDAB in the last 72 hours. Anemia Panel: No results for input(s): VITAMINB12, FOLATE, FERRITIN, TIBC, IRON, RETICCTPCT in the last 72 hours. Urine analysis:    Component Value Date/Time   COLORURINE YELLOW 02/12/2016 2026   APPEARANCEUR CLEAR 02/12/2016 2026   LABSPEC 1.020 02/12/2016 2026   PHURINE 6.0 02/12/2016 2026   GLUCOSEU 500 (A) 02/12/2016 2026   HGBUR MODERATE (A) 02/12/2016 2026   BILIRUBINUR NEGATIVE  02/12/2016 2026   KETONESUR NEGATIVE 02/12/2016 2026   PROTEINUR 100 (A) 02/12/2016 2026   UROBILINOGEN 0.2 08/25/2015 2130   NITRITE NEGATIVE 02/12/2016 2026   LEUKOCYTESUR NEGATIVE 02/12/2016 2026   Sepsis Labs: '@LABRCNTIP'$ (procalcitonin:4,lacticidven:4) )No results found for this or any previous visit (from the past 240 hour(s)).   Radiological Exams on Admission: Dg Chest 2 View  Result Date: 01/03/2017 CLINICAL DATA:  66 year old female with shortness of Breath, no improvement with home breathing treatments. Decreased breath sounds on auscultation. Initial encounter. EXAM: CHEST  2 VIEW COMPARISON:  02/25/2016 and earlier. FINDINGS: Upright AP and lateral views of the chest. Chronic pulmonary hyperinflation. Larger lung volumes compared to 2017. Mediastinal contours are stable and normal aside from cephalad mild hilar retraction. No pneumothorax or pulmonary edema. No pleural effusions suspected. Mildly increased chronic pulmonary interstitial opacity. Incidental left nipple shadow. No confluent pulmonary opacity. No acute osseous abnormality identified. Negative visible bowel gas pattern. IMPRESSION: Chronic pulmonary hyperinflation with increased lung volumes and mildly increased interstitial opacity compared to 2017. Consider viral or atypical respiratory infection. No pleural effusion. Electronically Signed   By: Genevie Ann M.D.   On: 01/03/2017 18:06   Ct Angio Chest Pe W Or Wo Contrast  Result Date: 01/03/2017 CLINICAL DATA:  Dyspnea and altered responsiveness. EXAM: CT ANGIOGRAPHY CHEST WITH CONTRAST TECHNIQUE: Multidetector CT imaging of the chest was performed using the standard protocol during bolus administration of intravenous contrast. Multiplanar CT image reconstructions and MIPs were obtained to evaluate the vascular anatomy. CONTRAST:  60 mL Isovue 370 intravenous COMPARISON:  Radiographs 01/03/2017, CT 08/27/2015 FINDINGS: Cardiovascular: Satisfactory opacification of the pulmonary  arteries to the segmental level. No evidence of pulmonary embolism. Normal heart size. No pericardial effusion. Mediastinum/Nodes: No enlarged mediastinal, hilar, or axillary lymph nodes. Thyroid gland, trachea, and esophagus demonstrate no significant findings. Lungs/Pleura: Marked hyperinflation. Severe centrilobular emphysematous disease. Scattered stable linear and nodular opacities at the periphery of both lungs. New patchy consolidation in the lateral right lower lobe base and in the central-lateral left lung base, suspicious for pneumonia. Airways are patent. No pleural effusion. Upper Abdomen: Small hiatal hernia.  No acute findings. Musculoskeletal: No significant skeletal lesion. Review of the MIP images confirms the above findings. IMPRESSION: 1. Negative for acute pulmonary embolism. 2. Patchy consolidation in both  lung bases, right greater than left, superimposed on severe emphysematous disease. This may represent pneumonia. 3. Small hiatal hernia. Electronically Signed   By: Andreas Newport M.D.   On: 01/03/2017 23:39    EKG: Independently reviewed. Sinus tachycardia.  Assessment/Plan Active Problems:   COPD exacerbation (HCC)   Esophageal reflux   Acute respiratory failure with hypoxia and hypercarbia (HCC)   Acute respiratory failure with hypoxia and hypercapnia (HCC)    1. Acute respiratory failure with hypoxia and hypercarbia probably secondary to COPD and pneumonia - I did discuss the ABG findings with pulmonologist. Since patient at this time is not in distress we will closely observe with continuation of steroids nebulizer and Pulmicort and antibiotics. Repeat ABG in a.m. 2. Pneumonia - patient has been placed on Levaquin. Check urine for Legionella and strep antigen and influenza PCR and follow blood cultures. 3. Sinus tachycardia which appears to be chronic - check thyroid function tests.   DVT prophylaxis: Lovenox. Code Status: Full code.  Family Communication: Discussed  with patient.  Disposition Plan: Home.  Consults called: None.  Admission status: Inpatient.    Rise Patience MD [facility] Pager [phone].  If 7PM-7AM, please contact night-coverage [email or web address] Password [email or web address]  01/04/2017, 1:49 AM

## 2017-01-04 NOTE — Telephone Encounter (Signed)
Attempted to contact pt. No answer, no option to leave a message. Will try back.  

## 2017-01-04 NOTE — Progress Notes (Signed)
I have seen and assessed patient and agree with Dr Moise Boring assessment and plan.Patient is a 66yo female with history of severe COPD on home O2 2 L nasal cannula, anxiety presented to the ED with worsening shortness of breath, wheezing, productive cough 1 week. CT chest done on admission negative for PE however showed patchy consolidation in both lung bases right greater than left superimposed on severe emphysematous disease likely representing pneumonia. Will change scheduled duo nebs to scheduled Xopenex and Atrovent nebs secondary to tachycardia. Add Pulmicort and Brovana. Add Claritin, Flonase, PPI. Continue empiric IV Levaquin. Change Solu-Medrol to 80 mg IV every 8 hours. If no significant improvement will likely need a pulmonary consultation for further evaluation and management.  No charge.

## 2017-01-04 NOTE — Progress Notes (Signed)
Pharmacy Antibiotic Note  Megan [name] is a 66 y.o. female admitted on 01/03/2017 with SOB/COPD exacerbation.  Pharmacy has been consulted for Levaquin dosing.  Plan: Levaquin 750 mg IV q48h  Height: 5' 3.5" (161.3 cm) Weight: 91 lb (41.3 kg) IBW/kg (Calculated) : 53.55  Temp (24hrs), Avg:98.6 F (37 C), Min:97.8 F (36.6 C), Max:100.2 F (37.9 C)   Recent Labs Lab 01/03/17 1731 01/03/17 1743 01/04/17 0208  WBC 9.3  --  7.8  CREATININE  --  0.60 0.53    Estimated Creatinine Clearance: 46.9 mL/min (by C-G formula based on SCr of 0.53 mg/dL).    Allergies  Allergen Reactions  . Penicillins Rash    Has patient had a PCN reaction causing immediate rash, facial/tongue/throat swelling, SOB or lightheadedness with hypotension: No Has patient had a PCN reaction causing severe rash involving mucus membranes or skin necrosis:NO Has patient had a PCN reaction that required hospitalization No Has patient had a PCN reaction occurring within the last 10 years:NO If all of the above answers are "NO", then may proceed with Cephalosporin use.     Caryl Pina 01/04/2017 6:42 AM

## 2017-01-04 NOTE — ED Notes (Signed)
Heart healthy diet ordered.

## 2017-01-04 NOTE — Telephone Encounter (Signed)
Spoke with the pt  She states calling as FYI to let RB know she is at [facility] and may have PNA Will forward to RB to make him aware

## 2017-01-04 NOTE — ED Notes (Signed)
Attempted report 

## 2017-01-05 DIAGNOSIS — R000 Tachycardia, unspecified: Secondary | ICD-10-CM

## 2017-01-05 DIAGNOSIS — R739 Hyperglycemia, unspecified: Secondary | ICD-10-CM | POA: Clinically undetermined

## 2017-01-05 DIAGNOSIS — I10 Essential (primary) hypertension: Secondary | ICD-10-CM

## 2017-01-05 DIAGNOSIS — E43 Unspecified severe protein-calorie malnutrition: Secondary | ICD-10-CM

## 2017-01-05 DIAGNOSIS — J189 Pneumonia, unspecified organism: Secondary | ICD-10-CM

## 2017-01-05 LAB — BASIC METABOLIC PANEL
ANION GAP: 6 (ref 5–15)
BUN: 15 mg/dL (ref 6–20)
CO2: 38 mmol/L — ABNORMAL HIGH (ref 22–32)
Calcium: 9.1 mg/dL (ref 8.9–10.3)
Chloride: 93 mmol/L — ABNORMAL LOW (ref 101–111)
Creatinine, Ser: 0.69 mg/dL (ref 0.44–1.00)
Glucose, Bld: 215 mg/dL — ABNORMAL HIGH (ref 65–99)
Potassium: 4.3 mmol/L (ref 3.5–5.1)
Sodium: 137 mmol/L (ref 135–145)

## 2017-01-05 LAB — T3, FREE: T3, Free: 2.4 pg/mL (ref 2.0–4.4)

## 2017-01-05 LAB — GLUCOSE, CAPILLARY
GLUCOSE-CAPILLARY: 181 mg/dL — AB (ref 65–99)
GLUCOSE-CAPILLARY: 169 mg/dL — AB (ref 65–99)
GLUCOSE-CAPILLARY: 210 mg/dL — AB (ref 65–99)

## 2017-01-05 LAB — CBC
HCT: 39.3 % (ref 36.0–46.0)
Hemoglobin: 12.6 g/dL (ref 12.0–15.0)
MCH: 31.8 pg (ref 26.0–34.0)
MCHC: 32.1 g/dL (ref 30.0–36.0)
MCV: 99.2 fL (ref 78.0–100.0)
PLATELETS: 443 10*3/uL — AB (ref 150–400)
RBC: 3.96 MIL/uL (ref 3.87–5.11)
RDW: 11.7 % (ref 11.5–15.5)
WBC: 9.8 10*3/uL (ref 4.0–10.5)

## 2017-01-05 LAB — MAGNESIUM: MAGNESIUM: 2 mg/dL (ref 1.7–2.4)

## 2017-01-05 MED ORDER — INSULIN ASPART 100 UNIT/ML ~~LOC~~ SOLN
0.0000 [IU] | Freq: Three times a day (TID) | SUBCUTANEOUS | Status: DC
  Administered 2017-01-05 – 2017-01-08 (×6): 2 [IU] via SUBCUTANEOUS
  Administered 2017-01-08: 3 [IU] via SUBCUTANEOUS
  Administered 2017-01-09: 2 [IU] via SUBCUTANEOUS
  Administered 2017-01-09: 3 [IU] via SUBCUTANEOUS
  Administered 2017-01-10 – 2017-01-11 (×3): 2 [IU] via SUBCUTANEOUS

## 2017-01-05 MED ORDER — ZOLPIDEM TARTRATE 5 MG PO TABS
5.0000 mg | ORAL_TABLET | Freq: Once | ORAL | Status: AC
  Administered 2017-01-06: 5 mg via ORAL
  Filled 2017-01-05: qty 1

## 2017-01-05 MED ORDER — ENSURE ENLIVE PO LIQD
237.0000 mL | Freq: Four times a day (QID) | ORAL | Status: DC
  Administered 2017-01-05 – 2017-01-11 (×21): 237 mL via ORAL

## 2017-01-05 MED ORDER — DILTIAZEM HCL ER COATED BEADS 120 MG PO CP24
120.0000 mg | ORAL_CAPSULE | Freq: Every day | ORAL | Status: DC
  Administered 2017-01-05: 120 mg via ORAL
  Filled 2017-01-05: qty 1

## 2017-01-05 NOTE — Progress Notes (Signed)
   01/05/17 1137  Clinical Encounter Type  Visited With Patient  Visit Type Initial  Referral From Nurse  Consult/Referral To Chaplain  Recommendations (follow up as needed)  Spiritual Encounters  Spiritual Needs Literature;Brochure  Stress Factors  Patient Stress Factors None identified  Family Stress Factors None identified  Advance Directives (For Healthcare)  Does Patient Have a Medical Advance Directive? Yes  Does patient want to make changes to medical advance directive? Yes (Inpatient - patient requests chaplain consult to change a medical advance directive)  Pt. Will call Pastoral Care when ready to complete Ad, mentioned possibly Solicitor.  Chaplain informed pt of services and availibility.  Will follow up as needed.  Chaplain Venus Ruhe A. Yuji Walth, MA-PC , BA-REL/PHIL , [phone]

## 2017-01-05 NOTE — Progress Notes (Signed)
Patient requesting dose of Xanax now for complaints of anxiety.  Last administration was at 11:11am today, ordered PRN BID.  RN text paged [facility] to see if patient could have dose early.  Patient also requesting dose of Ambien to help her sleep tonight, per patient takes at home.  RN inquired about this medication as well when text paged [facility].  If medications ordered RN will space administrations out, discussed this with patient and patient agreeable.

## 2017-01-05 NOTE — Progress Notes (Signed)
PROGRESS NOTE    Megan [name]  ZWC:[id number] DOB: 01-Jan-1951 DOA: 01/03/2017 PCP: Mauricio Po, FNP    Brief Narrative:  Megan [name] is a 66 y.o. female with severe COPD on home oxygen and anxiety presents to the ER because of shortness of breath. Patient states she has been short of breath for the last 1 week which has progressively gotten worse even at rest. Has been having productive cough. Denies any fever chills or chest pain.   ED Course: In the ER patient is found to be tachycardic short of breath. Patient had CAT scan of the chest which shows possible pneumonia. Patient also was wheezing on exam. Patient was placed on steroids nebulizer and antibiotics. Patient being admitted for further management. ABG showed hypercarbic respiratory failure.   Patient also noted to be tachycardic which has been chronic in nature. Likely MAT secondary to severe COPD disease. EKG was obtained. Patient placed on Cardizem.    Assessment & Plan:   Principal Problem:   Acute respiratory failure with hypoxia and hypercarbia (HCC) Active Problems:   Acute exacerbation of chronic obstructive pulmonary disease (COPD) (HCC)   Essential hypertension   COPD exacerbation (HCC)   Protein-calorie malnutrition, severe   Esophageal reflux   CAP (community acquired pneumonia)   Acute respiratory failure with hypoxia and hypercapnia (HCC)   Hyperglycemia  #1 acute respiratory failure with hypoxia and hypercarbia secondary to acute COPD exacerbation and probable community-acquired pneumonia Patient with some clinical improvement however still with chest tightness and noted to be tachycardic and not close to baseline. Patient afebrile. Continue Pulmicort and Brovana, scheduled Xopenex and Atrovent nebulizers, IV Levaquin, Claritin, IV Solu-Medrol 80 mg every 8 hours, PPI. Follow.  #2 community-acquired pneumonia Per CT chest. Patient afebrile with improvement with fever curve. Urine strep pneumococcus  antigen negative. Blood cultures with no growth to date preliminary results. Sputum Gram stain and culture pending. Continue O2, empiric IV Levaquin, COPD management. Follow.  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 Patient had 2-D echo done 08/22/2015 to evaluate tachycardia and noted at that time to have a EF of 55-60% with hypokinesis of the mid inferior lateral and inferior myocardium. Check a EKG. Placed on Cardizem CD 120 mg daily and titrate as needed for tachycardia. Follow.  #4 gastroesophageal reflux disease PPI.  #5 severe protein calorie malnutrition Placed on nutritional supplementation.  #6 hyperglycemia Likely steroid-induced. Check a hemoglobin A1c. Place on sliding scale insulin.  #7 hypertension Stable. Monitor closely with start of Cardizem.   DVT prophylaxis: Lovenox Code Status: Full Family Communication: Updated patient. No family at bedside. Disposition Plan: Home was acute COPD exacerbation has improved with improvement in pneumonia and tachycardia.   Consultants:   None  Procedures:   CT angiogram chest 01/03/2017  Chest x-ray 01/03/2017      Antimicrobials:  IV Levaquin 01/04/2017   Subjective: Patient with some complaints of tightness with her breathing. Patient states improvement with shortness of breath.  Objective: Vitals:   01/05/17 0457 01/05/17 0727 01/05/17 0904 01/05/17 0907  BP: 121/65 123/71    Pulse: (!) 104 88    Resp: 18 16    Temp: 98 F (36.7 C) 98 F (36.7 C)    TempSrc: Oral Oral    SpO2: 99% 100% 100% 100%  Weight: 40.7 kg (89 lb 12.8 oz)     Height:        Intake/Output Summary (Last 24 hours) at 01/05/17 1309  Last data filed at 01/05/17 0904  Gross per 24 hour  Intake             1380 ml  Output             1100 ml  Net              280 ml   Filed Weights   01/03/17 1702 01/04/17 1248 01/05/17 0457  Weight: 41.3 kg (91 lb) 40.4 kg  (89 lb 1.6 oz) 40.7 kg (89 lb 12.8 oz)    Examination:  General exam: Appears calm and comfortable.Frail Respiratory system: Poor to fair air movement. Some scattered wheezing. Respiratory effort normal. Cardiovascular system: Tachycardia. No JVD, murmurs, rubs, gallops or clicks. No pedal edema. Gastrointestinal system: Abdomen is nondistended, soft and nontender. No organomegaly or masses felt. Normal bowel sounds heard. Central nervous system: Alert and oriented. No focal neurological deficits. Extremities: Symmetric 5 x 5 power. Skin: No rashes, lesions or ulcers Psychiatry: Judgement and insight appear normal. Mood & affect appropriate.     Data Reviewed: I have personally reviewed following labs and imaging studies  CBC:  Recent Labs Lab 01/03/17 1731 01/03/17 1743 01/04/17 0208 01/05/17 0409  WBC 9.3  --  7.8 9.8  NEUTROABS 4.7  --   --   --   HGB 13.5 16.7* 12.5 12.6  HCT 41.1 49.0* 38.1 39.3  MCV 98.3  --  97.7 99.2  PLT 522*  --  421* 448*   Basic Metabolic Panel:  Recent Labs Lab 01/03/17 1743 01/04/17 0208 01/05/17 0409  NA 141 137 137  K 3.6 3.7 4.3  CL 94* 93* 93*  CO2  --  34* 38*  GLUCOSE 138* 230* 215*  BUN 6 <5* 15  CREATININE 0.60 0.53 0.69  CALCIUM  --  8.3* 9.1  MG  --   --  2.0   GFR: Estimated Creatinine Clearance: 46.2 mL/min (by C-G formula based on SCr of 0.69 mg/dL). Liver Function Tests: No results for input(s): AST, ALT, ALKPHOS, BILITOT, PROT, ALBUMIN in the last 168 hours. No results for input(s): LIPASE, AMYLASE in the last 168 hours. No results for input(s): AMMONIA in the last 168 hours. Coagulation Profile: No results for input(s): INR, PROTIME in the last 168 hours. Cardiac Enzymes: No results for input(s): CKTOTAL, CKMB, CKMBINDEX, TROPONINI in the last 168 hours. BNP (last 3 results) No results for input(s): PROBNP in the last 8760 hours. HbA1C: No results for input(s): HGBA1C in the last 72 hours. CBG:  Recent  Labs Lab 01/05/17 1255  GLUCAP 181*   Lipid Profile: No results for input(s): CHOL, HDL, LDLCALC, TRIG, CHOLHDL, LDLDIRECT in the last 72 hours. Thyroid Function Tests:  Recent Labs  01/04/17 0208 01/04/17 0755  TSH 0.173*  --   FREET4  --  1.02  T3FREE  --  2.4   Anemia Panel: No results for input(s): VITAMINB12, FOLATE, FERRITIN, TIBC, IRON, RETICCTPCT in the last 72 hours. Sepsis Labs: No results for input(s): PROCALCITON, LATICACIDVEN in the last 168 hours.  Recent Results (from the past 240 hour(s))  Blood culture (routine x 2)     Status: None (Preliminary result)   Collection Time: 01/04/17 12:20 AM  Result Value Ref Range Status   Specimen Description BLOOD LEFT ARM  Final   Special Requests BOTTLES DRAWN AEROBIC AND ANAEROBIC 5CC  Final   Culture NO GROWTH 1 DAY  Final   Report Status PENDING  Incomplete  Blood culture (routine x 2)  Status: None (Preliminary result)   Collection Time: 01/04/17 12:20 AM  Result Value Ref Range Status   Specimen Description BLOOD LEFT HAND  Final   Special Requests IN PEDIATRIC BOTTLE 4CC  Final   Culture NO GROWTH 1 DAY  Final   Report Status PENDING  Incomplete  MRSA PCR Screening     Status: Abnormal   Collection Time: 01/04/17  1:08 PM  Result Value Ref Range Status   MRSA by PCR POSITIVE (A) NEGATIVE Final    Comment:        The GeneXpert MRSA Assay (FDA approved for NASAL specimens only), is one component of a comprehensive MRSA colonization surveillance program. It is not intended to diagnose MRSA infection nor to guide or monitor treatment for MRSA infections. RESULT CALLED TO, READ BACK BY AND VERIFIED WITH: Jean Rosenthal RN 16:25 01/04/17 ([email or web address])          Radiology Studies: Dg Chest 2 View  Result Date: 01/03/2017 CLINICAL DATA:  66 year old female with shortness of Breath, no improvement with home breathing treatments. Decreased breath sounds on auscultation. Initial encounter. EXAM: CHEST  2 VIEW  COMPARISON:  02/25/2016 and earlier. FINDINGS: Upright AP and lateral views of the chest. Chronic pulmonary hyperinflation. Larger lung volumes compared to 2017. Mediastinal contours are stable and normal aside from cephalad mild hilar retraction. No pneumothorax or pulmonary edema. No pleural effusions suspected. Mildly increased chronic pulmonary interstitial opacity. Incidental left nipple shadow. No confluent pulmonary opacity. No acute osseous abnormality identified. Negative visible bowel gas pattern. IMPRESSION: Chronic pulmonary hyperinflation with increased lung volumes and mildly increased interstitial opacity compared to 2017. Consider viral or atypical respiratory infection. No pleural effusion. Electronically Signed   By: Genevie Ann M.D.   On: 01/03/2017 18:06   Ct Angio Chest Pe W Or Wo Contrast  Result Date: 01/03/2017 CLINICAL DATA:  Dyspnea and altered responsiveness. EXAM: CT ANGIOGRAPHY CHEST WITH CONTRAST TECHNIQUE: Multidetector CT imaging of the chest was performed using the standard protocol during bolus administration of intravenous contrast. Multiplanar CT image reconstructions and MIPs were obtained to evaluate the vascular anatomy. CONTRAST:  60 mL Isovue 370 intravenous COMPARISON:  Radiographs 01/03/2017, CT 08/27/2015 FINDINGS: Cardiovascular: Satisfactory opacification of the pulmonary arteries to the segmental level. No evidence of pulmonary embolism. Normal heart size. No pericardial effusion. Mediastinum/Nodes: No enlarged mediastinal, hilar, or axillary lymph nodes. Thyroid gland, trachea, and esophagus demonstrate no significant findings. Lungs/Pleura: Marked hyperinflation. Severe centrilobular emphysematous disease. Scattered stable linear and nodular opacities at the periphery of both lungs. New patchy consolidation in the lateral right lower lobe base and in the central-lateral left lung base, suspicious for pneumonia. Airways are patent. No pleural effusion. Upper Abdomen:  Small hiatal hernia.  No acute findings. Musculoskeletal: No significant skeletal lesion. Review of the MIP images confirms the above findings. IMPRESSION: 1. Negative for acute pulmonary embolism. 2. Patchy consolidation in both lung bases, right greater than left, superimposed on severe emphysematous disease. This may represent pneumonia. 3. Small hiatal hernia. Electronically Signed   By: Andreas Newport M.D.   On: 01/03/2017 23:39        Scheduled Meds: . arformoterol  15 mcg Nebulization BID  . aspirin EC  81 mg Oral Daily  . budesonide (PULMICORT) nebulizer solution  0.5 mg Nebulization BID  . diltiazem  120 mg Oral Daily  . enoxaparin (LOVENOX) injection  30 mg Subcutaneous Q24H  . feeding supplement (ENSURE ENLIVE)  237 mL Oral QID  . fluticasone  2 spray  Each Nare Daily  . insulin aspart  0-9 Units Subcutaneous TID WC  . ipratropium  0.5 mg Nebulization QID  . levalbuterol  0.63 mg Nebulization QID  . [START ON 01/06/2017] levofloxacin (LEVAQUIN) IV  750 mg Intravenous Q48H  . loratadine  10 mg Oral Daily  . methylPREDNISolone (SOLU-MEDROL) injection  80 mg Intravenous Q8H  . pantoprazole  40 mg Oral Q0600   Continuous Infusions:   LOS: 1 day    Time spent: 44 minutes    THOMPSON,DANIEL, MD [facility] Pager [phone]  If 7PM-7AM, please contact night-coverage [email or web address] Password [email or web address] 01/05/2017, 1:09 PM

## 2017-01-05 NOTE — Progress Notes (Addendum)
Initial Nutrition Assessment  DOCUMENTATION CODES:   Severe malnutrition in context of chronic illness, Underweight  INTERVENTION:    Ensure Enlive po QID, each supplement provides 350 kcal and 20 grams of protein  NUTRITION DIAGNOSIS:   Malnutrition related to chronic illness (COPD) as evidenced by energy intake < or equal to 75% for > or equal to 1 month, severe depletion of muscle mass.  GOAL:   Patient will meet greater than or equal to 90% of their needs  MONITOR:   PO intake, Supplement acceptance  REASON FOR ASSESSMENT:   Malnutrition Screening Tool    ASSESSMENT:   66 yo female with history of severe COPD (on home O2), anxiety presented to the ED with worsening SOB, wheezing, productive cough 1 week. CT chest negative for PE, showed patchy consolidation in both lung bases right greater than left superimposed on severe emphysematous disease likely representing pneumonia.   Patient reports poor intake for a long time related to family stress. She has difficulty breathing (COPD), which also decreases her appetite and intake. She weighed 105 lbs ~3 years ago, "in [place]." She has been below 95 lbs for the past 1-2 years. Nutrition-Focused physical exam completed. Findings are moderate fat depletion, severe muscle depletion, and no edema.  From discussion with patient regarding home intake, suspect intake has been meeting < 75% of estimated energy requirement for > 1 month. Patient enjoys Ensure supplements, but doesn't drink them at home because they are too expensive. Ensure coupons provided to patient. Discussed ways to increase protein and calorie intake at home.  Labs and medications reviewed.  Diet Order:  Diet regular Room service appropriate? Yes; Fluid consistency: Thin  Skin:  Reviewed, no issues  Last BM:  3/6  Height:   Ht Readings from Last 1 Encounters:  01/04/17 '5\' 3"'$  (1.6 m)    Weight:   Wt Readings from Last 1 Encounters:  01/05/17 89 lb  12.8 oz (40.7 kg)    Ideal Body Weight:  52.3 kg  BMI:  Body mass index is 15.91 kg/m.  Estimated Nutritional Needs:   Kcal:  1250-1500  Protein:  60-70 gm  Fluid:  1.5 L  EDUCATION NEEDS:   Education needs addressed  Molli Barrows, [place], [place], [place] Pager [phone] After Hours Pager [phone]

## 2017-01-06 LAB — HEMOGLOBIN A1C
HEMOGLOBIN A1C: 5.3 % (ref 4.8–5.6)
MEAN PLASMA GLUCOSE: 105 mg/dL

## 2017-01-06 LAB — BASIC METABOLIC PANEL
Anion gap: 6 (ref 5–15)
BUN: 21 mg/dL — AB (ref 6–20)
CALCIUM: 9 mg/dL (ref 8.9–10.3)
CO2: 40 mmol/L — ABNORMAL HIGH (ref 22–32)
CREATININE: 0.63 mg/dL (ref 0.44–1.00)
Chloride: 91 mmol/L — ABNORMAL LOW (ref 101–111)
GFR calc Af Amer: >60 mL/min (ref >60)
GFR calc non Af Amer: >60 mL/min (ref >60)
GLUCOSE: 190 mg/dL — AB (ref 65–99)
Potassium: 4.6 mmol/L (ref 3.5–5.1)
Sodium: 137 mmol/L (ref 135–145)

## 2017-01-06 LAB — GLUCOSE, CAPILLARY
Glucose-Capillary: 116 mg/dL — ABNORMAL HIGH (ref 65–99)
Glucose-Capillary: 173 mg/dL — ABNORMAL HIGH (ref 65–99)
Glucose-Capillary: 180 mg/dL — ABNORMAL HIGH (ref 65–99)
Glucose-Capillary: 169 mg/dL — ABNORMAL HIGH (ref 65–99)

## 2017-01-06 LAB — URINALYSIS, ROUTINE W REFLEX MICROSCOPIC
BILIRUBIN URINE: NEGATIVE
GLUCOSE, UA: NEGATIVE mg/dL
Hgb urine dipstick: NEGATIVE
Ketones, ur: NEGATIVE mg/dL
Leukocytes, UA: NEGATIVE
NITRITE: NEGATIVE
PH: 7 (ref 5.0–8.0)
Protein, ur: NEGATIVE mg/dL
SPECIFIC GRAVITY, URINE: 1.009 (ref 1.005–1.030)

## 2017-01-06 LAB — CBC
HCT: 39.5 % (ref 36.0–46.0)
Hemoglobin: 12.3 g/dL (ref 12.0–15.0)
MCH: 31.2 pg (ref 26.0–34.0)
MCHC: 31.1 g/dL (ref 30.0–36.0)
MCV: 100.3 fL — AB (ref 78.0–100.0)
PLATELETS: 495 10*3/uL — AB (ref 150–400)
RBC: 3.94 MIL/uL (ref 3.87–5.11)
RDW: 11.5 % (ref 11.5–15.5)
WBC: 11.4 10*3/uL — ABNORMAL HIGH (ref 4.0–10.5)

## 2017-01-06 LAB — MAGNESIUM: MAGNESIUM: 1.9 mg/dL (ref 1.7–2.4)

## 2017-01-06 LAB — LEGIONELLA PNEUMOPHILA SEROGP 1 UR AG: L. pneumophila Serogp 1 Ur Ag: NEGATIVE

## 2017-01-06 MED ORDER — ALPRAZOLAM 0.25 MG PO TABS
0.2500 mg | ORAL_TABLET | Freq: Three times a day (TID) | ORAL | Status: DC | PRN
  Administered 2017-01-06 – 2017-01-11 (×9): 0.25 mg via ORAL
  Filled 2017-01-06 (×9): qty 1

## 2017-01-06 MED ORDER — CHLORHEXIDINE GLUCONATE CLOTH 2 % EX PADS
6.0000 | MEDICATED_PAD | Freq: Every day | CUTANEOUS | Status: DC
  Administered 2017-01-07 – 2017-01-10 (×4): 6 via TOPICAL

## 2017-01-06 MED ORDER — LEVALBUTEROL HCL 0.63 MG/3ML IN NEBU
0.6300 mg | INHALATION_SOLUTION | Freq: Three times a day (TID) | RESPIRATORY_TRACT | Status: DC
  Administered 2017-01-06 – 2017-01-11 (×14): 0.63 mg via RESPIRATORY_TRACT
  Filled 2017-01-06 (×16): qty 3

## 2017-01-06 MED ORDER — MIRTAZAPINE 15 MG PO TBDP
15.0000 mg | ORAL_TABLET | Freq: Every evening | ORAL | Status: DC | PRN
  Filled 2017-01-06: qty 1

## 2017-01-06 MED ORDER — IPRATROPIUM BROMIDE 0.02 % IN SOLN: 0.5000 mg | RESPIRATORY_TRACT | Status: DC | PRN

## 2017-01-06 MED ORDER — MUPIROCIN 2 % EX OINT
1.0000 "application " | TOPICAL_OINTMENT | Freq: Two times a day (BID) | CUTANEOUS | Status: AC
  Administered 2017-01-06 – 2017-01-11 (×10): 1 via NASAL
  Filled 2017-01-06 (×2): qty 22

## 2017-01-06 MED ORDER — IPRATROPIUM BROMIDE 0.02 % IN SOLN
0.5000 mg | Freq: Three times a day (TID) | RESPIRATORY_TRACT | Status: DC
  Administered 2017-01-06 – 2017-01-11 (×14): 0.5 mg via RESPIRATORY_TRACT
  Filled 2017-01-06 (×16): qty 2.5

## 2017-01-06 MED ORDER — BUTALBITAL-APAP-CAFFEINE 50-325-40 MG PO TABS
1.0000 | ORAL_TABLET | Freq: Once | ORAL | Status: AC
  Administered 2017-01-06: 1 via ORAL
  Filled 2017-01-06: qty 1

## 2017-01-06 MED ORDER — LEVALBUTEROL HCL 0.63 MG/3ML IN NEBU: 0.6300 mg | INHALATION_SOLUTION | RESPIRATORY_TRACT | Status: DC | PRN

## 2017-01-06 MED ORDER — LEVOFLOXACIN 750 MG PO TABS
750.0000 mg | ORAL_TABLET | ORAL | Status: DC
  Administered 2017-01-08 – 2017-01-10 (×3): 750 mg via ORAL
  Filled 2017-01-06 (×4): qty 1

## 2017-01-06 MED ORDER — DILTIAZEM HCL ER COATED BEADS 180 MG PO CP24
180.0000 mg | ORAL_CAPSULE | Freq: Every day | ORAL | Status: DC
  Administered 2017-01-06 – 2017-01-08 (×3): 180 mg via ORAL
  Filled 2017-01-06 (×3): qty 1

## 2017-01-06 MED ORDER — METHYLPREDNISOLONE SODIUM SUCC 125 MG IJ SOLR
80.0000 mg | Freq: Two times a day (BID) | INTRAMUSCULAR | Status: DC
  Administered 2017-01-07: 80 mg via INTRAVENOUS
  Filled 2017-01-06: qty 2

## 2017-01-06 NOTE — Progress Notes (Signed)
PT Cancellation Note  Patient Details Name: Megan [name] MRN: [id number] DOB: 1951/02/02   Cancelled Treatment:    Reason Eval/Treat Not Completed: Patient declined, no reason specified Pt declined PT secondary to having difficulty breathing and not feeling like it, "I have been up to the bathroom a lot today."   [place] 01/06/2017, 3:40 PM Wray Kearns, [place], DPT [phone]

## 2017-01-06 NOTE — Progress Notes (Signed)
PROGRESS NOTE    Megan [name]  RCV:[id number] DOB: 1951-05-04 DOA: 01/03/2017 PCP: Mauricio Po, FNP    Brief Narrative:  Megan [name] is a 66 y.o. female with severe COPD on home oxygen and anxiety presents to the ER because of shortness of breath. Patient states she has been short of breath for the last 1 week which has progressively gotten worse even at rest. Has been having productive cough. Denies any fever chills or chest pain.   ED Course: In the ER patient is found to be tachycardic short of breath. Patient had CAT scan of the chest which shows possible pneumonia. Patient also was wheezing on exam. Patient was placed on steroids nebulizer and antibiotics. Patient being admitted for further management. ABG showed hypercarbic respiratory failure.   Patient also noted to be tachycardic which has been chronic in nature. Likely MAT secondary to severe COPD disease. EKG was obtained. Patient placed on Cardizem.    Assessment & Plan:   Principal Problem:   Acute respiratory failure with hypoxia and hypercarbia (HCC) Active Problems:   Acute exacerbation of chronic obstructive pulmonary disease (COPD) (HCC)   Essential hypertension   COPD exacerbation (HCC)   Protein-calorie malnutrition, severe   Esophageal reflux   CAP (community acquired pneumonia)   Acute respiratory failure with hypoxia and hypercapnia (HCC)   Hyperglycemia   Tachycardia  #1 acute respiratory failure with hypoxia and hypercarbia secondary to acute COPD exacerbation and probable community-acquired pneumonia Patient with some clinical improvement however still with chest tightness and noted to be tachycardic and not close to baseline. Patient afebrile. Continue Pulmicort and Brovana, scheduled Xopenex and Atrovent nebulizers, IV Levaquin, Claritin, PPI. Decrease IV Solu-Medrol to 80 mg every 12 hours. Follow.  #2 community-acquired pneumonia Per CT chest. Patient afebrile with improvement with fever curve.  Urine strep pneumococcus antigen negative. Blood cultures with no growth to date preliminary results. Sputum Gram stain and culture pending. Continue O2, empiric IV Levaquin, COPD management. Follow.  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 Patient had 2-D echo done 08/22/2015 to evaluate tachycardia and noted at that time to have a EF of 55-60% with hypokinesis of the mid inferior lateral and inferior myocardium. Check a EKG. Increase Cardizem CD 180 mg daily and titrate as needed for tachycardia. Follow.  #4 gastroesophageal reflux disease PPI.  #5 severe protein calorie malnutrition Placed on nutritional supplementation.  #6 hyperglycemia Likely steroid-induced. Hemoglobin A1c = 5.3. Continue sliding scale insulin.  #7 hypertension Stable. Monitor closely with start of Cardizem.   DVT prophylaxis: Lovenox Code Status: Full Family Communication: Updated patient. No family at bedside. Disposition Plan: Home was acute COPD exacerbation has improved with improvement in pneumonia and tachycardia.   Consultants:   None  Procedures:   CT angiogram chest 01/03/2017  Chest x-ray 01/03/2017      Antimicrobials:  IV Levaquin 01/04/2017>>Oral Levaquin 01/08/2017   Subjective: Patient states some improvement with chest tightness. Shortness of breath improving however not at baseline.   Objective: Vitals:   01/06/17 0353 01/06/17 0843 01/06/17 1051 01/06/17 1449  BP: (!) 173/95 (!) 152/70 (!) 149/79   Pulse:      Resp:      Temp: 97.9 F (36.6 C) 98.5 F (36.9 C)    TempSrc: Axillary Oral    SpO2: 97% 100%  97%  Weight:      Height:        Intake/Output Summary (Last 24 hours) at  01/06/17 1850 Last data filed at 01/06/17 1811  Gross per 24 hour  Intake              951 ml  Output             3600 ml  Net            -2649 ml   Filed Weights   01/04/17 1248 01/05/17 0457 01/06/17 0352    Weight: 40.4 kg (89 lb 1.6 oz) 40.7 kg (89 lb 12.8 oz) 42.8 kg (94 lb 6.4 oz)    Examination:  General exam: Appears calm and comfortable.Frail Respiratory system: Poor to fair air movement. Some scattered wheezing. Respiratory effort normal. Cardiovascular system: Tachycardia. No JVD, murmurs, rubs, gallops or clicks. No pedal edema. Gastrointestinal system: Abdomen is nondistended, soft and nontender. No organomegaly or masses felt. Normal bowel sounds heard. Central nervous system: Alert and oriented. No focal neurological deficits. Extremities: Symmetric 5 x 5 power. Skin: No rashes, lesions or ulcers Psychiatry: Judgement and insight appear normal. Mood & affect appropriate.     Data Reviewed: I have personally reviewed following labs and imaging studies  CBC:  Recent Labs Lab 01/03/17 1731 01/03/17 1743 01/04/17 0208 01/05/17 0409 01/06/17 0333  WBC 9.3  --  7.8 9.8 11.4*  NEUTROABS 4.7  --   --   --   --   HGB 13.5 16.7* 12.5 12.6 12.3  HCT 41.1 49.0* 38.1 39.3 39.5  MCV 98.3  --  97.7 99.2 100.3*  PLT 522*  --  421* 443* 474*   Basic Metabolic Panel:  Recent Labs Lab 01/03/17 1743 01/04/17 0208 01/05/17 0409 01/06/17 0333  NA 141 137 137 137  K 3.6 3.7 4.3 4.6  CL 94* 93* 93* 91*  CO2  --  34* 38* 40*  GLUCOSE 138* 230* 215* 190*  BUN 6 <5* 15 21*  CREATININE 0.60 0.53 0.69 0.63  CALCIUM  --  8.3* 9.1 9.0  MG  --   --  2.0 1.9   GFR: Estimated Creatinine Clearance: 48.6 mL/min (by C-G formula based on SCr of 0.63 mg/dL). Liver Function Tests: No results for input(s): AST, ALT, ALKPHOS, BILITOT, PROT, ALBUMIN in the last 168 hours. No results for input(s): LIPASE, AMYLASE in the last 168 hours. No results for input(s): AMMONIA in the last 168 hours. Coagulation Profile: No results for input(s): INR, PROTIME in the last 168 hours. Cardiac Enzymes: No results for input(s): CKTOTAL, CKMB, CKMBINDEX, TROPONINI in the last 168 hours. BNP (last 3  results) No results for input(s): PROBNP in the last 8760 hours. HbA1C:  Recent Labs  01/05/17 0842  HGBA1C 5.3   CBG:  Recent Labs Lab 01/05/17 1639 01/05/17 2150 01/06/17 0740 01/06/17 1148 01/06/17 1617  GLUCAP 169* 210* 116* 173* 180*   Lipid Profile: No results for input(s): CHOL, HDL, LDLCALC, TRIG, CHOLHDL, LDLDIRECT in the last 72 hours. Thyroid Function Tests:  Recent Labs  01/04/17 0208 01/04/17 0755  TSH 0.173*  --   FREET4  --  1.02  T3FREE  --  2.4   Anemia Panel: No results for input(s): VITAMINB12, FOLATE, FERRITIN, TIBC, IRON, RETICCTPCT in the last 72 hours. Sepsis Labs: No results for input(s): PROCALCITON, LATICACIDVEN in the last 168 hours.  Recent Results (from the past 240 hour(s))  Blood culture (routine x 2)     Status: None (Preliminary result)   Collection Time: 01/04/17 12:20 AM  Result Value Ref Range Status   Specimen Description BLOOD  LEFT ARM  Final   Special Requests BOTTLES DRAWN AEROBIC AND ANAEROBIC 5CC  Final   Culture NO GROWTH 2 DAYS  Final   Report Status PENDING  Incomplete  Blood culture (routine x 2)     Status: None (Preliminary result)   Collection Time: 01/04/17 12:20 AM  Result Value Ref Range Status   Specimen Description BLOOD LEFT HAND  Final   Special Requests IN PEDIATRIC BOTTLE 4CC  Final   Culture NO GROWTH 2 DAYS  Final   Report Status PENDING  Incomplete  MRSA PCR Screening     Status: Abnormal   Collection Time: 01/04/17  1:08 PM  Result Value Ref Range Status   MRSA by PCR POSITIVE (A) NEGATIVE Final    Comment:        The GeneXpert MRSA Assay (FDA approved for NASAL specimens only), is one component of a comprehensive MRSA colonization surveillance program. It is not intended to diagnose MRSA infection nor to guide or monitor treatment for MRSA infections. RESULT CALLED TO, READ BACK BY AND VERIFIED WITH: Jean Rosenthal RN 16:25 01/04/17 ([email or web address])          Radiology Studies: No results  found.      Scheduled Meds: . arformoterol  15 mcg Nebulization BID  . aspirin EC  81 mg Oral Daily  . budesonide (PULMICORT) nebulizer solution  0.5 mg Nebulization BID  . [START ON 01/07/2017] Chlorhexidine Gluconate Cloth  6 each Topical Q0600  . diltiazem  180 mg Oral Daily  . enoxaparin (LOVENOX) injection  30 mg Subcutaneous Q24H  . feeding supplement (ENSURE ENLIVE)  237 mL Oral QID  . fluticasone  2 spray Each Nare Daily  . insulin aspart  0-9 Units Subcutaneous TID WC  . ipratropium  0.5 mg Nebulization TID  . levalbuterol  0.63 mg Nebulization TID  . [START ON 01/08/2017] levofloxacin  750 mg Oral Q48H  . loratadine  10 mg Oral Daily  . [START ON 01/07/2017] methylPREDNISolone (SOLU-MEDROL) injection  80 mg Intravenous Q12H  . mupirocin ointment  1 application Nasal BID  . pantoprazole  40 mg Oral Q0600   Continuous Infusions:   LOS: 2 days    Time spent: 58 minutes    Darleny Sem, MD [facility] Pager [phone]  If 7PM-7AM, please contact night-coverage [email or web address] Password [email or web address] 01/06/2017, 6:50 PM

## 2017-01-07 ENCOUNTER — Inpatient Hospital Stay (HOSPITAL_COMMUNITY): Payer: Medicare Other

## 2017-01-07 LAB — BASIC METABOLIC PANEL
Anion gap: 4 — ABNORMAL LOW (ref 5–15)
BUN: 20 mg/dL (ref 6–20)
CHLORIDE: 89 mmol/L — AB (ref 101–111)
CO2: 45 mmol/L — AB (ref 22–32)
Calcium: 9 mg/dL (ref 8.9–10.3)
Creatinine, Ser: 0.54 mg/dL (ref 0.44–1.00)
GFR calc Af Amer: >60 mL/min (ref >60)
GFR calc non Af Amer: >60 mL/min (ref >60)
Glucose, Bld: 168 mg/dL — ABNORMAL HIGH (ref 65–99)
POTASSIUM: 4.5 mmol/L (ref 3.5–5.1)
SODIUM: 138 mmol/L (ref 135–145)

## 2017-01-07 LAB — CBC
HEMATOCRIT: 39.4 % (ref 36.0–46.0)
HEMOGLOBIN: 12.1 g/dL (ref 12.0–15.0)
MCH: 31 pg (ref 26.0–34.0)
MCHC: 30.7 g/dL (ref 30.0–36.0)
MCV: 101 fL — ABNORMAL HIGH (ref 78.0–100.0)
Platelets: 520 10*3/uL — ABNORMAL HIGH (ref 150–400)
RBC: 3.9 MIL/uL (ref 3.87–5.11)
RDW: 11.5 % (ref 11.5–15.5)
WBC: 10.5 10*3/uL (ref 4.0–10.5)

## 2017-01-07 LAB — GLUCOSE, CAPILLARY
GLUCOSE-CAPILLARY: 171 mg/dL — AB (ref 65–99)
GLUCOSE-CAPILLARY: 116 mg/dL — AB (ref 65–99)
GLUCOSE-CAPILLARY: 127 mg/dL — AB (ref 65–99)
Glucose-Capillary: 104 mg/dL — ABNORMAL HIGH (ref 65–99)

## 2017-01-07 MED ORDER — KETOROLAC TROMETHAMINE 30 MG/ML IJ SOLN
30.0000 mg | Freq: Four times a day (QID) | INTRAMUSCULAR | Status: DC | PRN
  Administered 2017-01-09: 30 mg via INTRAVENOUS
  Filled 2017-01-07: qty 1

## 2017-01-07 MED ORDER — ZOLPIDEM TARTRATE 5 MG PO TABS
5.0000 mg | ORAL_TABLET | Freq: Once | ORAL | Status: AC
  Administered 2017-01-07: 5 mg via ORAL
  Filled 2017-01-07: qty 1

## 2017-01-07 MED ORDER — BUTALBITAL-APAP-CAFFEINE 50-325-40 MG PO TABS
1.0000 | ORAL_TABLET | Freq: Once | ORAL | Status: AC
  Administered 2017-01-07: 1 via ORAL

## 2017-01-07 MED ORDER — PROCHLORPERAZINE EDISYLATE 5 MG/ML IJ SOLN
10.0000 mg | Freq: Four times a day (QID) | INTRAMUSCULAR | Status: DC | PRN
  Administered 2017-01-07: 10 mg via INTRAVENOUS
  Filled 2017-01-07: qty 2

## 2017-01-07 NOTE — Progress Notes (Signed)
After patient woke up from a nap, she woke up complaining of "a debilitating headache." She describes it as a crushing feeling from the base of the back of her head that wraps around to her right eye. She says she "feels like something is terribly wrong." Pt's neuro status intact and no changes from that standpoint. I paged Dr. Grandville Silos and made him aware. He ordered CT of head and medications. Orders followed through. Will continue to monitor. Pt updated.

## 2017-01-07 NOTE — Progress Notes (Signed)
OT Cancellation Note  Patient Details Name: Megan [name] MRN: [id number] DOB: [birth year]   Cancelled Treatment:    Reason Eval/Treat Not Completed: Patient at procedure or test/ unavailable (currently with RT receiving breathing treatment). Will follow up as time allows.  Binnie Kand M.S., OTR/L Pager: [phone]  01/07/2017, 9:09 AM

## 2017-01-07 NOTE — Evaluation (Signed)
Occupational Therapy Evaluation  Patient Details Name: Megan [name] MRN: [id number] DOB: August 05, 1951 Today's Date: 01/07/2017    History of Present Illness Patient is a 66 y/o female with hx of MI, COPD, depression, HTN, anxiety presents with SOB. CT showed possible PNA. Found to be tachycardic. ABG showed hypercarbic respiratory failure.    Clinical Impression   Pt reports she was independent with ADL PTA. Currently pt overall min hand held assist for functional mobility and min guard-supervision for full ADL in sitting. Began energy conservation education with pt; pt reports high anxiety throughout session but very willing to participate. Pt planning to d/c home with near 24/7 supervision from family. Pt would benefit from continued skilled OT to address established goals.    Follow Up Recommendations  No OT follow up    Equipment Recommendations  Tub/shower seat    Recommendations for Other Services       Precautions / Restrictions Precautions Precaution Comments: watch HR, 02 sats Restrictions Weight Bearing Restrictions: No      Mobility Bed Mobility Overal bed mobility: Modified Independent Bed Mobility: Supine to Sit;Sit to Supine     Supine to sit: Modified independent (Device/Increase time) Sit to supine: Modified independent (Device/Increase time)   General bed mobility comments: No assist needed.  Transfers Overall transfer level: Needs assistance Equipment used: None Transfers: Sit to/from Stand Sit to Stand: Min guard         General transfer comment: Min guard for safety. Hand held assist required for dynamic balance    Balance Overall balance assessment: Needs assistance Sitting-balance support: Feet supported;No upper extremity supported Sitting balance-Leahy Scale: Good Sitting balance - Comments: Able to donn socks reaching outside BoS without difficulty.    Standing balance support: No upper extremity supported;During functional  activity Standing balance-Leahy Scale: Good Standing balance comment: Able to stand without UE support.                            ADL Overall ADL's : Needs assistance/impaired Eating/Feeding: Independent;Sitting   Grooming: Set up;Supervision/safety;Sitting;Wash/dry face   Upper Body Bathing: Set up;Supervision/ safety;Sitting   Lower Body Bathing: Min guard;Sit to/from stand Lower Body Bathing Details (indicate cue type and reason): to wash feet Upper Body Dressing : Set up;Supervision/safety;Sitting Upper Body Dressing Details (indicate cue type and reason): to doff/don gown Lower Body Dressing: Min guard;Sit to/from stand Lower Body Dressing Details (indicate cue type and reason): to doff/don socks Toilet Transfer: Minimal assistance;Ambulation;Regular Toilet (hand held assist)   Toileting- Clothing Manipulation and Hygiene: Supervision/safety;Sitting/lateral lean Toileting - Clothing Manipulation Details (indicate cue type and reason): for peri care     Functional mobility during ADLs: Minimal assistance (hand held assist)       Vision         Perception     Praxis      Pertinent Vitals/Pain Pain Assessment: No/denies pain      Hand Dominance Right   Extremity/Trunk Assessment Upper Extremity Assessment Upper Extremity Assessment: Overall WFL for tasks assessed   Lower Extremity Assessment Lower Extremity Assessment: Defer to PT evaluation   Cervical / Trunk Assessment Cervical / Trunk Assessment: Normal   Communication Communication Communication: No difficulties   Cognition Arousal/Alertness: Awake/alert Behavior During Therapy: Anxious Overall Cognitive Status: Within Functional Limits for tasks assessed                     General Comments      Exercises  Shoulder Instructions      Home Living Family/patient expects to be discharged to:: Private residence Living Arrangements: Children Available Help at  Discharge: Family;Available 24 hours/day Type of Home: House Home Access: Level entry     Home Layout: One level     Bathroom Shower/Tub: Tub/shower unit Shower/tub characteristics: Curtain Biochemist, clinical: Standard     Home Equipment: Cane - single point;Walker - 4 wheels          Prior Functioning/Environment Level of Independence: Independent        Comments: Daughter drives. She has not driven in 3 years. Has multiple babies at home - 1 is on hospice.        OT Problem List: Decreased strength;Decreased activity tolerance;Impaired balance (sitting and/or standing);Decreased knowledge of use of DME or AE;Cardiopulmonary status limiting activity      OT Treatment/Interventions: Self-care/ADL training;Energy conservation;DME and/or AE instruction;Therapeutic activities;Patient/family education;Balance training    OT Goals(Current goals can be found in the care plan section) Acute Rehab OT Goals Patient Stated Goal: calm down OT Goal Formulation: With patient Time For Goal Achievement: 01/21/17 Potential to Achieve Goals: Good ADL Goals Pt Will Perform Grooming: with modified independence;standing Pt Will Perform Upper Body Bathing: with modified independence;standing Pt Will Perform Lower Body Bathing: with modified independence;sit to/from stand Pt Will Perform Tub/Shower Transfer: Tub transfer;with modified independence;ambulating Additional ADL Goal #1: Pt will independently verbally recall 3 energy conservation strategies and use during ADL.  OT Frequency: Min 2X/week   Barriers to D/C:            Co-evaluation              End of Session Equipment Utilized During Treatment: Oxygen Nurse Communication: Mobility status;Other (comment) (pt very anxious, found 2 pills on floor)  Activity Tolerance: Patient tolerated treatment well Patient left: in bed;with call bell/phone within reach  OT Visit Diagnosis: Unsteadiness on feet (R26.81)                 ADL either performed or assessed with clinical judgement  Time: 0940-1003 OT Time Calculation (min): 23 min Charges:  OT General Charges $OT Visit: 1 Procedure OT Evaluation $OT Eval Moderate Complexity: 1 Procedure OT Treatments $Self Care/Home Management : 8-22 mins G-Codes:     Mel Almond A. Ulice Brilliant, M.S., OTR/L Pager: [place] 01/07/2017, 12:22 PM

## 2017-01-07 NOTE — Progress Notes (Signed)
PROGRESS NOTE    Megan [name]  UEA:[id number] DOB: 09/25/1951 DOA: 01/03/2017 PCP: Mauricio Po, FNP    Brief Narrative:  Megan [name] is a 66 y.o. female with severe COPD on home oxygen and anxiety presents to the ER because of shortness of breath. Patient states she has been short of breath for the last 1 week which has progressively gotten worse even at rest. Has been having productive cough. Denies any fever chills or chest pain.   ED Course: In the ER patient is found to be tachycardic short of breath. Patient had CAT scan of the chest which shows possible pneumonia. Patient also was wheezing on exam. Patient was placed on steroids nebulizer and antibiotics. Patient being admitted for further management. ABG showed hypercarbic respiratory failure.   Patient also noted to be tachycardic which has been chronic in nature. Likely MAT secondary to severe COPD disease. EKG was obtained. Patient placed on Cardizem.    Assessment & Plan:   Principal Problem:   Acute respiratory failure with hypoxia and hypercarbia (HCC) Active Problems:   Acute exacerbation of chronic obstructive pulmonary disease (COPD) (HCC)   Essential hypertension   COPD exacerbation (HCC)   Protein-calorie malnutrition, severe   Esophageal reflux   CAP (community acquired pneumonia)   Acute respiratory failure with hypoxia and hypercapnia (HCC)   Hyperglycemia   Tachycardia  #1 acute respiratory failure with hypoxia and hypercarbia secondary to acute COPD exacerbation and probable community-acquired pneumonia Patient with some clinical improvement however still with chest tightness and noted to be tachycardic and not close to baseline. Patient afebrile. Continue Pulmicort and Brovana, scheduled Xopenex and Atrovent nebulizers, IV Levaquin, Claritin, PPI, IV Solu-Medrol 80 mg every 12 hours. Follow.  #2 community-acquired pneumonia Per CT chest. Patient afebrile with improvement with fever curve. Urine strep  pneumococcus antigen negative. Blood cultures with no growth to date preliminary results. Sputum Gram stain and culture pending. Continue O2, empiric Levaquin, COPD management. Follow.  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 Patient had 2-D echo done 08/22/2015 to evaluate tachycardia and noted at that time to have a EF of 55-60% with hypokinesis of the mid inferior lateral and inferior myocardium. EKG with a sinus tachycardia /?MAT. Increased Cardizem CD to 180 mg daily and titrate as needed for tachycardia. Follow.  #4 gastroesophageal reflux disease PPI.  #5 severe protein calorie malnutrition Continue nutritional supplementation.  #6 hyperglycemia Likely steroid-induced. Hemoglobin A1c = 5.3. Continue sliding scale insulin.  #7 hypertension Stable. Continue Cardizem.   DVT prophylaxis: Lovenox Code Status: Full Family Communication: Updated patient. No family at bedside. Disposition Plan: Home when acute COPD exacerbation has improved with improvement in pneumonia and tachycardia.   Consultants:   None  Procedures:   CT angiogram chest 01/03/2017  Chest x-ray 01/03/2017      Antimicrobials:  IV Levaquin 01/04/2017>>Oral Levaquin 01/08/2017   Subjective: Patient states some improvement with chest tightness but not at baseline.   Objective: Vitals:   01/07/17 0324 01/07/17 0400 01/07/17 0536 01/07/17 0751  BP: (!) 166/81   (!) 166/90  Pulse: (!) 115 88    Resp:  18    Temp: 97.8 F (36.6 C)     TempSrc: Oral     SpO2: 95% 99% 99% 97%  Weight: 42.4 kg (93 lb 8 oz)     Height:        Intake/Output Summary (Last 24 hours) at 01/07/17 1015 Last data filed at  01/07/17 0500  Gross per 24 hour  Intake             1514 ml  Output             3060 ml  Net            -1546 ml   Filed Weights   01/05/17 0457 01/06/17 0352 01/07/17 0324  Weight: 40.7 kg (89 lb 12.8 oz) 42.8 kg (94 lb 6.4  oz) 42.4 kg (93 lb 8 oz)    Examination:  General exam: Appears calm and comfortable.Frail Respiratory system: Poor to fair air movement. Respiratory effort normal. Cardiovascular system: Tachycardia. No JVD, murmurs, rubs, gallops or clicks. No pedal edema. Gastrointestinal system: Abdomen is nondistended, soft and nontender. No organomegaly or masses felt. Normal bowel sounds heard. Central nervous system: Alert and oriented. No focal neurological deficits. Extremities: Symmetric 5 x 5 power. Skin: No rashes, lesions or ulcers Psychiatry: Judgement and insight appear normal. Mood & affect appropriate.     Data Reviewed: I have personally reviewed following labs and imaging studies  CBC:  Recent Labs Lab 01/03/17 1731 01/03/17 1743 01/04/17 0208 01/05/17 0409 01/06/17 0333 01/07/17 0440  WBC 9.3  --  7.8 9.8 11.4* 10.5  NEUTROABS 4.7  --   --   --   --   --   HGB 13.5 16.7* 12.5 12.6 12.3 12.1  HCT 41.1 49.0* 38.1 39.3 39.5 39.4  MCV 98.3  --  97.7 99.2 100.3* 101.0*  PLT 522*  --  421* 443* 495* 161*   Basic Metabolic Panel:  Recent Labs Lab 01/03/17 1743 01/04/17 0208 01/05/17 0409 01/06/17 0333 01/07/17 0440  NA 141 137 137 137 138  K 3.6 3.7 4.3 4.6 4.5  CL 94* 93* 93* 91* 89*  CO2  --  34* 38* 40* 45*  GLUCOSE 138* 230* 215* 190* 168*  BUN 6 <5* 15 21* 20  CREATININE 0.60 0.53 0.69 0.63 0.54  CALCIUM  --  8.3* 9.1 9.0 9.0  MG  --   --  2.0 1.9  --    GFR: Estimated Creatinine Clearance: 48.2 mL/min (by C-G formula based on SCr of 0.54 mg/dL). Liver Function Tests: No results for input(s): AST, ALT, ALKPHOS, BILITOT, PROT, ALBUMIN in the last 168 hours. No results for input(s): LIPASE, AMYLASE in the last 168 hours. No results for input(s): AMMONIA in the last 168 hours. Coagulation Profile: No results for input(s): INR, PROTIME in the last 168 hours. Cardiac Enzymes: No results for input(s): CKTOTAL, CKMB, CKMBINDEX, TROPONINI in the last 168  hours. BNP (last 3 results) No results for input(s): PROBNP in the last 8760 hours. HbA1C:  Recent Labs  01/05/17 0842  HGBA1C 5.3   CBG:  Recent Labs Lab 01/06/17 0740 01/06/17 1148 01/06/17 1617 01/06/17 2125 01/07/17 0753  GLUCAP 116* 173* 180* 169* 104*   Lipid Profile: No results for input(s): CHOL, HDL, LDLCALC, TRIG, CHOLHDL, LDLDIRECT in the last 72 hours. Thyroid Function Tests: No results for input(s): TSH, T4TOTAL, FREET4, T3FREE, THYROIDAB in the last 72 hours. Anemia Panel: No results for input(s): VITAMINB12, FOLATE, FERRITIN, TIBC, IRON, RETICCTPCT in the last 72 hours. Sepsis Labs: No results for input(s): PROCALCITON, LATICACIDVEN in the last 168 hours.  Recent Results (from the past 240 hour(s))  Blood culture (routine x 2)     Status: None (Preliminary result)   Collection Time: 01/04/17 12:20 AM  Result Value Ref Range Status   Specimen Description BLOOD LEFT ARM  Final   Special Requests BOTTLES DRAWN AEROBIC AND ANAEROBIC 5CC  Final   Culture NO GROWTH 2 DAYS  Final   Report Status PENDING  Incomplete  Blood culture (routine x 2)     Status: None (Preliminary result)   Collection Time: 01/04/17 12:20 AM  Result Value Ref Range Status   Specimen Description BLOOD LEFT HAND  Final   Special Requests IN PEDIATRIC BOTTLE 4CC  Final   Culture NO GROWTH 2 DAYS  Final   Report Status PENDING  Incomplete  MRSA PCR Screening     Status: Abnormal   Collection Time: 01/04/17  1:08 PM  Result Value Ref Range Status   MRSA by PCR POSITIVE (A) NEGATIVE Final    Comment:        The GeneXpert MRSA Assay (FDA approved for NASAL specimens only), is one component of a comprehensive MRSA colonization surveillance program. It is not intended to diagnose MRSA infection nor to guide or monitor treatment for MRSA infections. RESULT CALLED TO, READ BACK BY AND VERIFIED WITH: Jean Rosenthal RN 16:25 01/04/17 ([email or web address])          Radiology Studies: No results  found.      Scheduled Meds: . arformoterol  15 mcg Nebulization BID  . aspirin EC  81 mg Oral Daily  . budesonide (PULMICORT) nebulizer solution  0.5 mg Nebulization BID  . Chlorhexidine Gluconate Cloth  6 each Topical Q0600  . diltiazem  180 mg Oral Daily  . enoxaparin (LOVENOX) injection  30 mg Subcutaneous Q24H  . feeding supplement (ENSURE ENLIVE)  237 mL Oral QID  . fluticasone  2 spray Each Nare Daily  . insulin aspart  0-9 Units Subcutaneous TID WC  . ipratropium  0.5 mg Nebulization TID  . levalbuterol  0.63 mg Nebulization TID  . [START ON 01/08/2017] levofloxacin  750 mg Oral Q48H  . loratadine  10 mg Oral Daily  . methylPREDNISolone (SOLU-MEDROL) injection  80 mg Intravenous Q12H  . mupirocin ointment  1 application Nasal BID  . pantoprazole  40 mg Oral Q0600   Continuous Infusions:   LOS: 3 days    Time spent: 58 minutes    Margalit Leece, MD [facility] Pager [phone]  If 7PM-7AM, please contact night-coverage [email or web address] Password [email or web address] 01/07/2017, 10:15 AM

## 2017-01-07 NOTE — Evaluation (Signed)
Physical Therapy Evaluation Patient Details Name: Megan [name] MRN: [id number] DOB: [birth year] Today's Date: 01/07/2017   History of Present Illness  Patient is a 66 y/o female with hx of MI, COPD, depression, HTN, anxiety presents with SOB. CT showed possible PNA. Found to be tachycardic. ABG showed hypercarbic respiratory failure.   Clinical Impression  Patient presents with dyspnea on exertion, pain, decreased activity tolerance and tachycardia s/p above. Tolerated 5x STS and able to complete in 24 seconds demonstrating increased risk for falls and increased disability. Mobility limited due to 3/4 DOE using mostly accessory muscles for respiration as well as elevated HR at rest, worsened with activity ranging from 127-137 bpm. Discussed breathing techniques as pt very anxious. Will plan on increasing activity once HR is more controlled. Will follow.    Follow Up Recommendations No PT follow up;Supervision - Intermittent    Equipment Recommendations  None recommended by PT    Recommendations for Other Services       Precautions / Restrictions Precautions Precaution Comments: watch HR, 02 sats Restrictions Weight Bearing Restrictions: No      Mobility  Bed Mobility Overal bed mobility: Needs Assistance Bed Mobility: Supine to Sit;Sit to Supine     Supine to sit: Modified independent (Device/Increase time) Sit to supine: Modified independent (Device/Increase time)   General bed mobility comments: No assist needed.  Transfers Overall transfer level: Needs assistance   Transfers: Sit to/from Stand Sit to Stand: Modified independent (Device/Increase time)         General transfer comment: Stood from EOB x5, limited by DOE- 3/4.  Ambulation/Gait             General Gait Details: Deferred secondary to tachycardia.  Stairs            Wheelchair Mobility    Modified Rankin (Stroke Patients Only)       Balance Overall balance assessment: Modified  Independent   Sitting balance-Leahy Scale: Good Sitting balance - Comments: Able to donn socks reaching outside BoS without difficulty.    Standing balance support: During functional activity Standing balance-Leahy Scale: Good Standing balance comment: Able to stand without UE support.                             Pertinent Vitals/Pain Pain Assessment: 0-10 Pain Score: 7  Pain Location: left side of chest Pain Descriptors / Indicators: Aching;Sore Pain Intervention(s): Monitored during session;Repositioned    Home Living Family/patient expects to be discharged to:: Private residence Living Arrangements: Children (with daughter) Available Help at Discharge: Family;Available 24 hours/day Type of Home: House Home Access: Level entry     Home Layout: One level Home Equipment: Cane - single point;Walker - 4 wheels      Prior Function Level of Independence: Independent         Comments: Daughter drives. She has not driven in 3 years. Has multiple babies at home - 1 is on hospice.     Hand Dominance   Dominant Hand: Right    Extremity/Trunk Assessment   Upper Extremity Assessment Upper Extremity Assessment: Defer to OT evaluation    Lower Extremity Assessment Lower Extremity Assessment: Generalized weakness    Cervical / Trunk Assessment Cervical / Trunk Assessment: Normal  Communication   Communication: No difficulties  Cognition Arousal/Alertness: Awake/alert Behavior During Therapy: Anxious Overall Cognitive Status: Within Functional Limits for tasks assessed  General Comments General comments (skin integrity, edema, etc.): Sp02 ranged from 92-100% on 2L/min 02. HR ranged from 127-137 bpm.    Exercises     Assessment/Plan    PT Assessment Patient needs continued PT services  PT Problem List Decreased mobility;Decreased activity tolerance;Cardiopulmonary status limiting activity;Decreased balance       PT  Treatment Interventions Gait training;Therapeutic activities;Therapeutic exercise;Patient/family education;Functional mobility training;Balance training;DME instruction    PT Goals (Current goals can be found in the Care Plan section)  Acute Rehab PT Goals Patient Stated Goal: to get this heart rate under control PT Goal Formulation: With patient Time For Goal Achievement: 01/21/17 Potential to Achieve Goals: Good    Frequency Min 3X/week   Barriers to discharge        Co-evaluation               End of Session Equipment Utilized During Treatment: Oxygen Activity Tolerance: Treatment limited secondary to medical complications (Comment) (limited due to tachycardia and DOE.) Patient left: in bed;with call bell/phone within reach Nurse Communication: Mobility status PT Visit Diagnosis: Difficulty in walking, not elsewhere classified (R26.2)         Time: 1120-1140 PT Time Calculation (min) (ACUTE ONLY): 20 min   Charges:   PT Evaluation $PT Eval Low Complexity: 1 Procedure     PT G Codes:         Kaegan Stigler A Satara Virella 01/07/2017, 11:44 AM Wray Kearns, PT, DPT [phone]

## 2017-01-08 LAB — BASIC METABOLIC PANEL
Anion gap: 8 (ref 5–15)
BUN: 21 mg/dL — AB (ref 6–20)
CO2: 44 mmol/L — AB (ref 22–32)
Calcium: 8.5 mg/dL — ABNORMAL LOW (ref 8.9–10.3)
Chloride: 86 mmol/L — ABNORMAL LOW (ref 101–111)
Creatinine, Ser: 0.57 mg/dL (ref 0.44–1.00)
GFR calc Af Amer: >60 mL/min (ref >60)
GFR calc non Af Amer: >60 mL/min (ref >60)
Glucose, Bld: 176 mg/dL — ABNORMAL HIGH (ref 65–99)
POTASSIUM: 4.9 mmol/L (ref 3.5–5.1)
Sodium: 138 mmol/L (ref 135–145)

## 2017-01-08 LAB — GLUCOSE, CAPILLARY
GLUCOSE-CAPILLARY: 135 mg/dL — AB (ref 65–99)
Glucose-Capillary: 170 mg/dL — ABNORMAL HIGH (ref 65–99)
Glucose-Capillary: 228 mg/dL — ABNORMAL HIGH (ref 65–99)

## 2017-01-08 MED ORDER — SODIUM CHLORIDE 0.9 % IV BOLUS (SEPSIS)
500.0000 mL | Freq: Once | INTRAVENOUS | Status: AC
  Administered 2017-01-08: 500 mL via INTRAVENOUS

## 2017-01-08 MED ORDER — PREDNISONE 20 MG PO TABS
60.0000 mg | ORAL_TABLET | Freq: Two times a day (BID) | ORAL | Status: DC
  Administered 2017-01-09 – 2017-01-11 (×5): 60 mg via ORAL
  Filled 2017-01-08 (×5): qty 3

## 2017-01-08 MED ORDER — METHYLPREDNISOLONE SODIUM SUCC 125 MG IJ SOLR
60.0000 mg | Freq: Two times a day (BID) | INTRAMUSCULAR | Status: AC
  Administered 2017-01-08 (×2): 60 mg via INTRAVENOUS
  Filled 2017-01-08 (×2): qty 2

## 2017-01-08 NOTE — Progress Notes (Signed)
PROGRESS NOTE    Megan [name]  TML:[id number] DOB: 1951/09/26 DOA: 01/03/2017 PCP: Mauricio Po, FNP    Brief Narrative:  Megan [name] is a 66 y.o. female with severe COPD on home oxygen and anxiety presents to the ER because of shortness of breath. Patient states she has been short of breath for the last 1 week which has progressively gotten worse even at rest. Has been having productive cough. Denies any fever chills or chest pain.   ED Course: In the ER patient is found to be tachycardic short of breath. Patient had CAT scan of the chest which shows possible pneumonia. Patient also was wheezing on exam. Patient was placed on steroids nebulizer and antibiotics. Patient being admitted for further management. ABG showed hypercarbic respiratory failure.   Patient also noted to be tachycardic which has been chronic in nature. Likely MAT secondary to severe COPD disease. EKG was obtained. Patient placed on Cardizem.    Assessment & Plan:   Principal Problem:   Acute respiratory failure with hypoxia and hypercarbia (HCC) Active Problems:   Acute exacerbation of chronic obstructive pulmonary disease (COPD) (HCC)   Essential hypertension   COPD exacerbation (HCC)   Protein-calorie malnutrition, severe   Esophageal reflux   CAP (community acquired pneumonia)   Acute respiratory failure with hypoxia and hypercapnia (HCC)   Hyperglycemia   Tachycardia  #1 acute respiratory failure with hypoxia and hypercarbia secondary to acute COPD exacerbation and probable community-acquired pneumonia Patient with some clinical improvement however still with chest tightness and noted to be tachycardic and not close to baseline. Patient afebrile. Continue Pulmicort and Brovana, scheduled Xopenex and Atrovent nebulizers, IV Levaquin, Claritin, PPI. Change IV Solu-Medrol to 60 mg every 12 hours. Follow.  #2 community-acquired pneumonia Per CT chest. Patient afebrile with improvement with fever curve.  Urine strep pneumococcus antigen negative. Blood cultures with no growth to date preliminary results. Sputum Gram stain and culture pending. Continue O2, empiric Levaquin, COPD management. Follow.  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 Patient had 2-D echo done 08/22/2015 to evaluate tachycardia and noted at that time to have a EF of 55-60% with hypokinesis of the mid inferior lateral and inferior myocardium. EKG with a sinus tachycardia /?MAT. Increased Cardizem CD to 180 mg daily and titrate as needed for tachycardia. Follow.  #4 gastroesophageal reflux disease PPI.  #5 severe protein calorie malnutrition Continue nutritional supplementation.  #6 hyperglycemia Likely steroid-induced. Hemoglobin A1c = 5.3. Continue sliding scale insulin.  #7 hypertension Stable. Continue Cardizem.   DVT prophylaxis: Lovenox Code Status: Full Family Communication: Updated patient. No family at bedside. Disposition Plan: Home when acute COPD exacerbation has improved with improvement in pneumonia and tachycardia.   Consultants:   None  Procedures:   CT angiogram chest 01/03/2017  Chest x-ray 01/03/2017  CT head 01/07/2017    Antimicrobials:  IV Levaquin 01/04/2017>>Oral Levaquin 01/08/2017   Subjective: Patient states some improvement with chest tightness but not at baseline. Patient states headache is improved.  Objective: Vitals:   01/08/17 0000 01/08/17 0445 01/08/17 0753 01/08/17 1101  BP: (!) 163/82 (!) 135/58 93/65 (!) 152/78  Pulse: (!) 104 93 100 (!) 114  Resp: '18 18 18 18  '$ Temp: 98 F (36.7 C) 98.3 F (36.8 C) 98 F (36.7 C) 98.2 F (36.8 C)  TempSrc: Oral Oral Oral Oral  SpO2: 95% 100% 100% 98%  Weight:  42.1 kg (92 lb 14.4 oz)    Height:  Intake/Output Summary (Last 24 hours) at 01/08/17 1110 Last data filed at 01/08/17 1025  Gross per 24 hour  Intake              240 ml  Output              2050 ml  Net            -1810 ml   Filed Weights   01/06/17 0352 01/07/17 0324 01/08/17 0445  Weight: 42.8 kg (94 lb 6.4 oz) 42.4 kg (93 lb 8 oz) 42.1 kg (92 lb 14.4 oz)    Examination:  General exam: Appears calm and comfortable.Frail Respiratory system: Poor to fair air movement. Respiratory effort normal. Cardiovascular system: Tachycardia. No JVD, murmurs, rubs, gallops or clicks. No pedal edema. Gastrointestinal system: Abdomen is nondistended, soft and nontender. No organomegaly or masses felt. Normal bowel sounds heard. Central nervous system: Alert and oriented. No focal neurological deficits. Extremities: Symmetric 5 x 5 power. Skin: No rashes, lesions or ulcers Psychiatry: Judgement and insight appear normal. Mood & affect appropriate.     Data Reviewed: I have personally reviewed following labs and imaging studies  CBC:  Recent Labs Lab 01/03/17 1731 01/03/17 1743 01/04/17 0208 01/05/17 0409 01/06/17 0333 01/07/17 0440  WBC 9.3  --  7.8 9.8 11.4* 10.5  NEUTROABS 4.7  --   --   --   --   --   HGB 13.5 16.7* 12.5 12.6 12.3 12.1  HCT 41.1 49.0* 38.1 39.3 39.5 39.4  MCV 98.3  --  97.7 99.2 100.3* 101.0*  PLT 522*  --  421* 443* 495* 195*   Basic Metabolic Panel:  Recent Labs Lab 01/04/17 0208 01/05/17 0409 01/06/17 0333 01/07/17 0440 01/08/17 0346  NA 137 137 137 138 138  K 3.7 4.3 4.6 4.5 4.9  CL 93* 93* 91* 89* 86*  CO2 34* 38* 40* 45* 44*  GLUCOSE 230* 215* 190* 168* 176*  BUN <5* 15 21* 20 21*  CREATININE 0.53 0.69 0.63 0.54 0.57  CALCIUM 8.3* 9.1 9.0 9.0 8.5*  MG  --  2.0 1.9  --   --    GFR: Estimated Creatinine Clearance: 47.8 mL/min (by C-G formula based on SCr of 0.57 mg/dL). Liver Function Tests: No results for input(s): AST, ALT, ALKPHOS, BILITOT, PROT, ALBUMIN in the last 168 hours. No results for input(s): LIPASE, AMYLASE in the last 168 hours. No results for input(s): AMMONIA in the last 168 hours. Coagulation Profile: No  results for input(s): INR, PROTIME in the last 168 hours. Cardiac Enzymes: No results for input(s): CKTOTAL, CKMB, CKMBINDEX, TROPONINI in the last 168 hours. BNP (last 3 results) No results for input(s): PROBNP in the last 8760 hours. HbA1C: No results for input(s): HGBA1C in the last 72 hours. CBG:  Recent Labs Lab 01/07/17 0753 01/07/17 1119 01/07/17 1637 01/07/17 2155 01/08/17 0758  GLUCAP 104* 171* 116* 127* 170*   Lipid Profile: No results for input(s): CHOL, HDL, LDLCALC, TRIG, CHOLHDL, LDLDIRECT in the last 72 hours. Thyroid Function Tests: No results for input(s): TSH, T4TOTAL, FREET4, T3FREE, THYROIDAB in the last 72 hours. Anemia Panel: No results for input(s): VITAMINB12, FOLATE, FERRITIN, TIBC, IRON, RETICCTPCT in the last 72 hours. Sepsis Labs: No results for input(s): PROCALCITON, LATICACIDVEN in the last 168 hours.  Recent Results (from the past 240 hour(s))  Blood culture (routine x 2)     Status: None (Preliminary result)   Collection Time: 01/04/17 12:20 AM  Result Value Ref Range  Status   Specimen Description BLOOD LEFT ARM  Final   Special Requests BOTTLES DRAWN AEROBIC AND ANAEROBIC 5CC  Final   Culture NO GROWTH 4 DAYS  Final   Report Status PENDING  Incomplete  Blood culture (routine x 2)     Status: None (Preliminary result)   Collection Time: 01/04/17 12:20 AM  Result Value Ref Range Status   Specimen Description BLOOD LEFT HAND  Final   Special Requests IN PEDIATRIC BOTTLE 4CC  Final   Culture NO GROWTH 4 DAYS  Final   Report Status PENDING  Incomplete  MRSA PCR Screening     Status: Abnormal   Collection Time: 01/04/17  1:08 PM  Result Value Ref Range Status   MRSA by PCR POSITIVE (A) NEGATIVE Final    Comment:        The GeneXpert MRSA Assay (FDA approved for NASAL specimens only), is one component of a comprehensive MRSA colonization surveillance program. It is not intended to diagnose MRSA infection nor to guide or monitor treatment  for MRSA infections. RESULT CALLED TO, READ BACK BY AND VERIFIED WITH: Jean Rosenthal RN 16:25 01/04/17 ([email or web address])          Radiology Studies: Ct Head Wo Contrast  Result Date: 01/07/2017 CLINICAL DATA:  Posterior headache. Difficulty controlling headache. EXAM: CT HEAD WITHOUT CONTRAST TECHNIQUE: Contiguous axial images were obtained from the base of the skull through the vertex without intravenous contrast. COMPARISON:  None. FINDINGS: Brain: No evidence of acute infarction, hemorrhage, hydrocephalus, extra-axial collection or mass lesion/mass effect. Mild right frontal periventricular white matter low attenuation likely related to microvascular disease. Vascular: No hyperdense vessel or unexpected calcification. Skull: No osseous abnormality. Sinuses/Orbits: Visualized paranasal sinuses are clear. Visualized mastoid sinuses are clear. Visualized orbits demonstrate no focal abnormality. Other: None IMPRESSION: No acute intracranial pathology. Electronically Signed   By: Kathreen Devoid   On: 01/07/2017 17:39        Scheduled Meds: . arformoterol  15 mcg Nebulization BID  . aspirin EC  81 mg Oral Daily  . budesonide (PULMICORT) nebulizer solution  0.5 mg Nebulization BID  . Chlorhexidine Gluconate Cloth  6 each Topical Q0600  . diltiazem  180 mg Oral Daily  . enoxaparin (LOVENOX) injection  30 mg Subcutaneous Q24H  . feeding supplement (ENSURE ENLIVE)  237 mL Oral QID  . fluticasone  2 spray Each Nare Daily  . insulin aspart  0-9 Units Subcutaneous TID WC  . ipratropium  0.5 mg Nebulization TID  . levalbuterol  0.63 mg Nebulization TID  . levofloxacin  750 mg Oral Q48H  . loratadine  10 mg Oral Daily  . methylPREDNISolone (SOLU-MEDROL) injection  60 mg Intravenous Q12H  . mupirocin ointment  1 application Nasal BID  . pantoprazole  40 mg Oral Q0600   Continuous Infusions:   LOS: 4 days    Time spent: 34 minutes    THOMPSON,DANIEL, MD [facility] Pager [phone]  If  7PM-7AM, please contact night-coverage [email or web address] Password [email or web address] 01/08/2017, 11:10 AM

## 2017-01-09 LAB — BASIC METABOLIC PANEL
ANION GAP: 5 (ref 5–15)
BUN: 22 mg/dL — ABNORMAL HIGH (ref 6–20)
CALCIUM: 8.8 mg/dL — AB (ref 8.9–10.3)
CO2: 43 mmol/L — ABNORMAL HIGH (ref 22–32)
CREATININE: 0.63 mg/dL (ref 0.44–1.00)
Chloride: 88 mmol/L — ABNORMAL LOW (ref 101–111)
Glucose, Bld: 281 mg/dL — ABNORMAL HIGH (ref 65–99)
Potassium: 4.7 mmol/L (ref 3.5–5.1)
Sodium: 136 mmol/L (ref 135–145)

## 2017-01-09 LAB — CULTURE, BLOOD (ROUTINE X 2)
CULTURE: NO GROWTH
CULTURE: NO GROWTH

## 2017-01-09 LAB — GLUCOSE, CAPILLARY
GLUCOSE-CAPILLARY: 140 mg/dL — AB (ref 65–99)
GLUCOSE-CAPILLARY: 267 mg/dL — AB (ref 65–99)
Glucose-Capillary: 209 mg/dL — ABNORMAL HIGH (ref 65–99)
Glucose-Capillary: 181 mg/dL — ABNORMAL HIGH (ref 65–99)

## 2017-01-09 MED ORDER — SORBITOL 70 % SOLN
30.0000 mL | Freq: Once | Status: AC
  Administered 2017-01-09: 30 mL via ORAL
  Filled 2017-01-09: qty 30

## 2017-01-09 MED ORDER — POLYETHYLENE GLYCOL 3350 17 G PO PACK: 17.0000 g | PACK | Freq: Every day | ORAL | Status: DC

## 2017-01-09 MED ORDER — DILTIAZEM HCL ER COATED BEADS 240 MG PO CP24
240.0000 mg | ORAL_CAPSULE | Freq: Every day | ORAL | Status: DC
  Administered 2017-01-09 – 2017-01-11 (×3): 240 mg via ORAL
  Filled 2017-01-09 (×3): qty 1

## 2017-01-09 MED ORDER — POLYETHYLENE GLYCOL 3350 17 G PO PACK
17.0000 g | PACK | Freq: Two times a day (BID) | ORAL | Status: DC
  Administered 2017-01-09 – 2017-01-11 (×4): 17 g via ORAL
  Filled 2017-01-09 (×4): qty 1

## 2017-01-09 MED ORDER — SENNOSIDES-DOCUSATE SODIUM 8.6-50 MG PO TABS
1.0000 | ORAL_TABLET | Freq: Two times a day (BID) | ORAL | Status: DC
  Administered 2017-01-09 – 2017-01-11 (×4): 1 via ORAL
  Filled 2017-01-09 (×4): qty 1

## 2017-01-09 NOTE — Progress Notes (Signed)
Updated report via Alfredo Martinez in patient's room using SBAR format, reviewed VS, new orders, POC and events of the day, assumed care of patient.

## 2017-01-09 NOTE — Plan of Care (Signed)
Problem: Bowel/Gastric: Goal: Will not experience complications related to bowel motility Outcome: Progressing Patient is C/O hard stools and very small amounts, ordered Miralax and Senokot, will continue to monitor.

## 2017-01-09 NOTE — Progress Notes (Addendum)
PROGRESS NOTE    Megan [name]  LOV:[id number] DOB: 22-Jun-1951 DOA: 01/03/2017 PCP: Mauricio Po, FNP    Brief Narrative:  Megan [name] is a 66 y.o. female with severe COPD on home oxygen and anxiety presents to the ER because of shortness of breath. Patient states she has been short of breath for the last 1 week which has progressively gotten worse even at rest. Has been having productive cough. Denies any fever chills or chest pain.   ED Course: In the ER patient is found to be tachycardic short of breath. Patient had CAT scan of the chest which shows possible pneumonia. Patient also was wheezing on exam. Patient was placed on steroids nebulizer and antibiotics. Patient being admitted for further management. ABG showed hypercarbic respiratory failure.   Patient also noted to be tachycardic which has been chronic in nature. Likely MAT secondary to severe COPD disease. EKG was obtained. Patient placed on Cardizem.    Assessment & Plan:   Principal Problem:   Acute respiratory failure with hypoxia and hypercarbia (HCC) Active Problems:   Acute exacerbation of chronic obstructive pulmonary disease (COPD) (HCC)   Essential hypertension   COPD exacerbation (HCC)   Protein-calorie malnutrition, severe   Esophageal reflux   CAP (community acquired pneumonia)   Acute respiratory failure with hypoxia and hypercapnia (HCC)   Hyperglycemia   Tachycardia  #1 acute respiratory failure with hypoxia and hypercarbia secondary to acute COPD exacerbation and probable community-acquired pneumonia Patient with some clinical improvement however still with chest tightness and noted to be tachycardic and not close to baseline. Patient afebrile. Continue Pulmicort and Brovana, scheduled Xopenex and Atrovent nebulizers, oral Levaquin, Claritin, PPI. Change IV Solu-Medrol to oral prednisone taper. Patient will likely require slow taper. Patient will need to follow-up with pulmonary in the outpatient  setting.   #2 community-acquired pneumonia Per CT chest. Patient afebrile with improvement with fever curve. Urine strep pneumococcus antigen negative. Blood cultures with no growth to date preliminary results. Sputum Gram stain and culture pending. Continue O2, empiric Levaquin, COPD management. Follow.  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 Patient had 2-D echo done 08/22/2015 to evaluate tachycardia and noted at that time to have a EF of 55-60% with hypokinesis of the mid inferior lateral and inferior myocardium. EKG with a sinus tachycardia /?MAT. TSH was low. Free T4 and free T3 within normal limits and a such abnormal TSH likely secondary to euthyroid sick syndrome. Patient will need repeat thyroid study functions done in about 4-6 weeks. Increase Cardizem CD to 240 mg daily and titrate as needed for tachycardia. Follow.  #4 gastroesophageal reflux disease PPI.  #5 severe protein calorie malnutrition Continue nutritional supplementation.  #6 hyperglycemia Likely steroid-induced. Hemoglobin A1c = 5.3. Continue sliding scale insulin.  #7 hypertension Stable. Continue Cardizem.   DVT prophylaxis: Lovenox Code Status: Full Family Communication: Updated patient. No family at bedside. Disposition Plan: Home when acute COPD exacerbation has improved with improvement in pneumonia and tachycardia, hopefully tomorrow 01/10/2017   Consultants:   None  Procedures:   CT angiogram chest 01/03/2017  Chest x-ray 01/03/2017  CT head 01/07/2017    Antimicrobials:  IV Levaquin 01/04/2017>>Oral Levaquin 01/08/2017   Subjective: Patient states some improvement with chest tightness but not at baseline. Patient states headache is improved. Tachycardia improving.  Objective: Vitals:   01/09/17 0745 01/09/17 0755 01/09/17 0803 01/09/17 1137  BP:      Pulse:   (!) 112 Marland Kitchen)  118  Resp:   18 15  Temp:   98.6 F (37  C) 98.4 F (36.9 C)  TempSrc:   Oral Oral  SpO2: 99% 98% 99% 98%  Weight:      Height:        Intake/Output Summary (Last 24 hours) at 01/09/17 1229 Last data filed at 01/09/17 0500  Gross per 24 hour  Intake              480 ml  Output             1800 ml  Net            -1320 ml   Filed Weights   01/07/17 0324 01/08/17 0445 01/09/17 0016  Weight: 42.4 kg (93 lb 8 oz) 42.1 kg (92 lb 14.4 oz) 42.3 kg (93 lb 3.2 oz)    Examination:  General exam: Appears calm and comfortable.Frail Respiratory system: Poor to fair air movement. Respiratory effort normal. Cardiovascular system: Tachycardia. No JVD, murmurs, rubs, gallops or clicks. No pedal edema. Gastrointestinal system: Abdomen is nondistended, soft and nontender. No organomegaly or masses felt. Normal bowel sounds heard. Central nervous system: Alert and oriented. No focal neurological deficits. Extremities: Symmetric 5 x 5 power. Skin: No rashes, lesions or ulcers Psychiatry: Judgement and insight appear normal. Mood & affect appropriate.     Data Reviewed: I have personally reviewed following labs and imaging studies  CBC:  Recent Labs Lab 01/03/17 1731 01/03/17 1743 01/04/17 0208 01/05/17 0409 01/06/17 0333 01/07/17 0440  WBC 9.3  --  7.8 9.8 11.4* 10.5  NEUTROABS 4.7  --   --   --   --   --   HGB 13.5 16.7* 12.5 12.6 12.3 12.1  HCT 41.1 49.0* 38.1 39.3 39.5 39.4  MCV 98.3  --  97.7 99.2 100.3* 101.0*  PLT 522*  --  421* 443* 495* 818*   Basic Metabolic Panel:  Recent Labs Lab 01/05/17 0409 01/06/17 0333 01/07/17 0440 01/08/17 0346 01/09/17 0530  NA 137 137 138 138 136  K 4.3 4.6 4.5 4.9 4.7  CL 93* 91* 89* 86* 88*  CO2 38* 40* 45* 44* 43*  GLUCOSE 215* 190* 168* 176* 281*  BUN 15 21* 20 21* 22*  CREATININE 0.69 0.63 0.54 0.57 0.63  CALCIUM 9.1 9.0 9.0 8.5* 8.8*  MG 2.0 1.9  --   --   --    GFR: Estimated Creatinine Clearance: 48.1 mL/min (by C-G formula based on SCr of 0.63 mg/dL). Liver  Function Tests: No results for input(s): AST, ALT, ALKPHOS, BILITOT, PROT, ALBUMIN in the last 168 hours. No results for input(s): LIPASE, AMYLASE in the last 168 hours. No results for input(s): AMMONIA in the last 168 hours. Coagulation Profile: No results for input(s): INR, PROTIME in the last 168 hours. Cardiac Enzymes: No results for input(s): CKTOTAL, CKMB, CKMBINDEX, TROPONINI in the last 168 hours. BNP (last 3 results) No results for input(s): PROBNP in the last 8760 hours. HbA1C: No results for input(s): HGBA1C in the last 72 hours. CBG:  Recent Labs Lab 01/08/17 0758 01/08/17 1545 01/08/17 2112 01/09/17 0730 01/09/17 1111  GLUCAP 170* 228* 135* 209* 140*   Lipid Profile: No results for input(s): CHOL, HDL, LDLCALC, TRIG, CHOLHDL, LDLDIRECT in the last 72 hours. Thyroid Function Tests: No results for input(s): TSH, T4TOTAL, FREET4, T3FREE, THYROIDAB in the last 72 hours. Anemia Panel: No results for input(s): VITAMINB12, FOLATE, FERRITIN, TIBC, IRON, RETICCTPCT in the last 72 hours. Sepsis  Labs: No results for input(s): PROCALCITON, LATICACIDVEN in the last 168 hours.  Recent Results (from the past 240 hour(s))  Blood culture (routine x 2)     Status: None (Preliminary result)   Collection Time: 01/04/17 12:20 AM  Result Value Ref Range Status   Specimen Description BLOOD LEFT ARM  Final   Special Requests BOTTLES DRAWN AEROBIC AND ANAEROBIC 5CC  Final   Culture NO GROWTH 4 DAYS  Final   Report Status PENDING  Incomplete  Blood culture (routine x 2)     Status: None (Preliminary result)   Collection Time: 01/04/17 12:20 AM  Result Value Ref Range Status   Specimen Description BLOOD LEFT HAND  Final   Special Requests IN PEDIATRIC BOTTLE 4CC  Final   Culture NO GROWTH 4 DAYS  Final   Report Status PENDING  Incomplete  MRSA PCR Screening     Status: Abnormal   Collection Time: 01/04/17  1:08 PM  Result Value Ref Range Status   MRSA by PCR POSITIVE (A) NEGATIVE  Final    Comment:        The GeneXpert MRSA Assay (FDA approved for NASAL specimens only), is one component of a comprehensive MRSA colonization surveillance program. It is not intended to diagnose MRSA infection nor to guide or monitor treatment for MRSA infections. RESULT CALLED TO, READ BACK BY AND VERIFIED WITH: Jean Rosenthal RN 16:25 01/04/17 ([email or web address])          Radiology Studies: Ct Head Wo Contrast  Result Date: 01/07/2017 CLINICAL DATA:  Posterior headache. Difficulty controlling headache. EXAM: CT HEAD WITHOUT CONTRAST TECHNIQUE: Contiguous axial images were obtained from the base of the skull through the vertex without intravenous contrast. COMPARISON:  None. FINDINGS: Brain: No evidence of acute infarction, hemorrhage, hydrocephalus, extra-axial collection or mass lesion/mass effect. Mild right frontal periventricular white matter low attenuation likely related to microvascular disease. Vascular: No hyperdense vessel or unexpected calcification. Skull: No osseous abnormality. Sinuses/Orbits: Visualized paranasal sinuses are clear. Visualized mastoid sinuses are clear. Visualized orbits demonstrate no focal abnormality. Other: None IMPRESSION: No acute intracranial pathology. Electronically Signed   By: Kathreen Devoid   On: 01/07/2017 17:39        Scheduled Meds: . arformoterol  15 mcg Nebulization BID  . aspirin EC  81 mg Oral Daily  . budesonide (PULMICORT) nebulizer solution  0.5 mg Nebulization BID  . Chlorhexidine Gluconate Cloth  6 each Topical Q0600  . diltiazem  240 mg Oral Daily  . enoxaparin (LOVENOX) injection  30 mg Subcutaneous Q24H  . feeding supplement (ENSURE ENLIVE)  237 mL Oral QID  . fluticasone  2 spray Each Nare Daily  . insulin aspart  0-9 Units Subcutaneous TID WC  . ipratropium  0.5 mg Nebulization TID  . levalbuterol  0.63 mg Nebulization TID  . levofloxacin  750 mg Oral Q48H  . loratadine  10 mg Oral Daily  . mupirocin ointment  1 application Nasal  BID  . pantoprazole  40 mg Oral Q0600  . polyethylene glycol  17 g Oral Daily  . predniSONE  60 mg Oral BID WC  . senna-docusate  1 tablet Oral BID  . sorbitol  30 mL Oral Once   Continuous Infusions:   LOS: 5 days    Time spent: 35 minutes    THOMPSON,DANIEL, MD [facility] Pager [phone]  If 7PM-7AM, please contact night-coverage [email or web address] Password [facility] 01/09/2017, 12:29 PM

## 2017-01-10 LAB — GLUCOSE, CAPILLARY
GLUCOSE-CAPILLARY: 178 mg/dL — AB (ref 65–99)
Glucose-Capillary: 129 mg/dL — ABNORMAL HIGH (ref 65–99)
Glucose-Capillary: 119 mg/dL — ABNORMAL HIGH (ref 65–99)
Glucose-Capillary: 152 mg/dL — ABNORMAL HIGH (ref 65–99)
Glucose-Capillary: 180 mg/dL — ABNORMAL HIGH (ref 65–99)

## 2017-01-10 NOTE — Progress Notes (Signed)
Physical Therapy Treatment Patient Details Name: Megan [name] MRN: [id number] DOB: 12/12/1950 Today's Date: 01/10/2017    History of Present Illness Patient is a 66 y/o female with hx of MI, COPD, depression, HTN, anxiety presents with SOB. CT showed possible PNA. Found to be tachycardic. ABG showed hypercarbic respiratory failure.     PT Comments    Patient progressing well towards PT goals. Tolerated gait training outside of room today with use of RW but demonstrates dyspnea on exertion with drop in Sp02 to 80% on 2L/min 02. HR ranged from 100-129 bpm. Pt unsteady without AD so recommending use of RW at home for stability. Pt agreeable. Able to perform pursed lip breathing and Sp02 improved within 1 minute. Pt may need more than 2L at home during activity. Will follow.   Follow Up Recommendations  No PT follow up;Supervision - Intermittent     Equipment Recommendations  Rolling walker with 5" wheels    Recommendations for Other Services       Precautions / Restrictions Precautions Precaution Comments: watch HR, 02 sats Restrictions Weight Bearing Restrictions: No    Mobility  Bed Mobility Overal bed mobility: Modified Independent Bed Mobility: Supine to Sit;Sit to Supine     Supine to sit: Modified independent (Device/Increase time);HOB elevated Sit to supine: Modified independent (Device/Increase time);HOB elevated   General bed mobility comments: No assist needed.  Transfers Overall transfer level: Needs assistance Equipment used: None Transfers: Sit to/from Stand Sit to Stand: Modified independent (Device/Increase time)         General transfer comment: Stood from EOB x1, from toilet x1 without assist.   Ambulation/Gait Ambulation/Gait assistance: Min guard Ambulation Distance (Feet): 100 Feet Assistive device: Rolling walker (2 wheeled);None Gait Pattern/deviations: Step-through pattern;Decreased stride length;Trunk flexed Gait velocity: decreased Gait  velocity interpretation: <1.8 ft/sec, indicative of risk for recurrent falls General Gait Details: Ambulated with and without RW, more unsteady without BUE support. 3/4 DOE. Sp02 dropped to 80% on 2L/min 02. HR ranged from 100-129 bpm. Cues for pursed lip breathing.    Stairs            Wheelchair Mobility    Modified Rankin (Stroke Patients Only)       Balance Overall balance assessment: Needs assistance Sitting-balance support: Feet supported;No upper extremity supported Sitting balance-Leahy Scale: Good     Standing balance support: During functional activity Standing balance-Leahy Scale: Good Standing balance comment: Able to stand at sink and wash hands without difficulty reaching outside BoS but more steady during dynamic standing with UE support.                    Cognition Arousal/Alertness: Awake/alert Behavior During Therapy: Anxious Overall Cognitive Status: Within Functional Limits for tasks assessed                      Exercises      General Comments        Pertinent Vitals/Pain Pain Assessment: No/denies pain    Home Living                      Prior Function            PT Goals (current goals can now be found in the care plan section) Progress towards PT goals: Progressing toward goals    Frequency    Min 3X/week      PT Plan Current plan remains appropriate    Co-evaluation  End of Session Equipment Utilized During Treatment: Oxygen Activity Tolerance: Treatment limited secondary to medical complications (Comment) (drop in Sp02) Patient left: in bed;with call bell/phone within reach Nurse Communication: Mobility status PT Visit Diagnosis: Difficulty in walking, not elsewhere classified (R26.2)     Time: 0981-1914 PT Time Calculation (min) (ACUTE ONLY): 21 min  Charges:  $Therapeutic Exercise: 8-22 mins                    G Codes:       Roderick Calo A Tarin Navarez 01/10/2017, 9:25 AM   Wray Kearns, PT, DPT [phone]

## 2017-01-10 NOTE — Progress Notes (Signed)
Occupational Therapy Treatment Patient Details Name: Megan [name] MRN: [id number] DOB: [birth year] Today's Date: 01/10/2017    History of present illness Patient is a 66 y/o female with hx of MI, COPD, depression, HTN, anxiety presents with SOB. CT showed possible PNA. Found to be tachycardic. ABG showed hypercarbic respiratory failure.    OT comments  Pt is able to perform ADLs with supervision and functional transfers mod I.  Reviewed energy conservation techniques, but pt self distracts frequently.  Recommend use of tub seat for home.   Follow Up Recommendations  No OT follow up    Equipment Recommendations  Tub/shower seat    Recommendations for Other Services      Precautions / Restrictions Precautions Precautions: Fall       Mobility Bed Mobility Overal bed mobility: Independent                Transfers Overall transfer level: Independent                    Balance     Sitting balance-Leahy Scale: Good       Standing balance-Leahy Scale: Good                     ADL                                       Functional mobility during ADLs: Supervision/safety General ADL Comments: Pt is able to perform ADLs with supervision.  Discussed options for tub seat with her.  She is concerned that she will have to take it out of tub after each use so her grand daughter can care for her grandchildren       Vision                     Perception     Praxis      Cognition   Behavior During Therapy: Anxious Overall Cognitive Status: Within Functional Limits for tasks assessed                  General Comments: Pt very distracted by social/home issuse involving her grandchildren and their parents.  She is requesting legal advice for her daughter and is very hard to redirect even when it is explained that we don't provide legal advice, nor have legal resources here       Exercises     Shoulder Instructions        General Comments      Pertinent Vitals/ Pain       Pain Assessment: No/denies pain  Home Living                                          Prior Functioning/Environment              Frequency  Min 2X/week        Progress Toward Goals  OT Goals(current goals can now be found in the care plan section)  Progress towards OT goals: Progressing toward goals     Plan Discharge plan remains appropriate    Co-evaluation                 End of Session Equipment Utilized During Treatment: Oxygen  OT Visit Diagnosis: Muscle weakness (generalized) (M62.81)  Activity Tolerance Patient tolerated treatment well   Patient Left in bed;with call bell/phone within reach   Nurse Communication Mobility status        Time: 4239-5320 OT Time Calculation (min): 37 min  Charges: OT General Charges $OT Visit: 1 Procedure OT Treatments $Therapeutic Activity: 23-37 mins  Lucille Passy, OTR/L [phone]   Greer, Jovany Disano M 01/10/2017, 5:45 PM

## 2017-01-10 NOTE — Progress Notes (Signed)
PROGRESS NOTE    Megan [name]  YSA:[id number] DOB: July 21, 1951 DOA: 01/03/2017 PCP: Mauricio Po, FNP    Brief Narrative:  Megan [name] is a 66 y.o. female with severe COPD on home oxygen and anxiety presents to the ER because of shortness of breath. Patient states she has been short of breath for the last 1 week which has progressively gotten worse even at rest. Has been having productive cough. Denies any fever chills or chest pain.   ED Course: In the ER patient is found to be tachycardic short of breath. Patient had CAT scan of the chest which shows possible pneumonia. Patient also was wheezing on exam. Patient was placed on steroids nebulizer and antibiotics. Patient being admitted for further management. ABG showed hypercarbic respiratory failure.   Patient also noted to be tachycardic which has been chronic in nature. Likely MAT secondary to severe COPD disease. EKG was obtained. Patient placed on Cardizem.    Assessment & Plan:   Principal Problem:   Acute respiratory failure with hypoxia and hypercarbia (HCC) Active Problems:   Acute exacerbation of chronic obstructive pulmonary disease (COPD) (HCC)   Essential hypertension   COPD exacerbation (HCC)   Protein-calorie malnutrition, severe   Esophageal reflux   CAP (community acquired pneumonia)   Acute respiratory failure with hypoxia and hypercapnia (HCC)   Hyperglycemia   Tachycardia  #1 acute respiratory failure with hypoxia and hypercarbia secondary to acute COPD exacerbation and probable community-acquired pneumonia Patient with some clinical improvement however still with chest tightness and noted to be tachycardic and not close to baseline. Patient afebrile. Continue Pulmicort and Brovana, scheduled Xopenex and Atrovent nebulizers, oral Levaquin, Claritin, PPI, oral prednisone taper. Patient will likely require slow taper. Patient will need to follow-up with pulmonary in the outpatient setting.   #2  community-acquired pneumonia Per CT chest. Patient afebrile with improvement with fever curve. Urine strep pneumococcus antigen negative. Blood cultures with no growth to date preliminary results. Sputum Gram stain and culture pending. Continue O2, empiric Levaquin, COPD management. Follow.  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 Patient had 2-D echo done 08/22/2015 to evaluate tachycardia and noted at that time to have a EF of 55-60% with hypokinesis of the mid inferior lateral and inferior myocardium. EKG with a sinus tachycardia /?MAT. TSH was low. Free T4 and free T3 within normal limits and a such abnormal TSH likely secondary to euthyroid sick syndrome. Patient will need repeat thyroid study functions done in about 4-6 weeks. Heart rate improving. Monitor heart rate on current dose of Cardizem that was increased yesterday to Cardizem CD 240 mg daily and titrate as needed for tachycardia. Follow.  #4 gastroesophageal reflux disease PPI.  #5 severe protein calorie malnutrition Continue nutritional supplementation.  #6 hyperglycemia Likely steroid-induced. Hemoglobin A1c = 5.3. Continue sliding scale insulin.  #7 hypertension Stable. Continue Cardizem.   DVT prophylaxis: Lovenox Code Status: Full Family Communication: Updated patient. No family at bedside. Disposition Plan: Home when acute COPD exacerbation has improved with improvement in pneumonia and tachycardia, hopefully tomorrow 01/11/2017   Consultants:   None  Procedures:   CT angiogram chest 01/03/2017  Chest x-ray 01/03/2017  CT head 01/07/2017    Antimicrobials:  IV Levaquin 01/04/2017>>Oral Levaquin 01/08/2017   Subjective: Patient states some improvement with chest tightness but not at baseline. Patient c/o not feeling too well today with tingling in her hands.   Objective: Vitals:   01/10/17 0014 01/10/17 3235  01/10/17 0710 01/10/17  0831  BP:  124/63 139/77   Pulse: 99 91 85   Resp:   16   Temp: 98 F (36.7 C) 98.4 F (36.9 C)    TempSrc: Oral Oral    SpO2: 96% 99% 100% 97%  Weight:  43.4 kg (95 lb 9.6 oz)    Height:        Intake/Output Summary (Last 24 hours) at 01/10/17 1328 Last data filed at 01/10/17 1100  Gross per 24 hour  Intake              840 ml  Output             1600 ml  Net             -760 ml   Filed Weights   01/08/17 0445 01/09/17 0016 01/10/17 0343  Weight: 42.1 kg (92 lb 14.4 oz) 42.3 kg (93 lb 3.2 oz) 43.4 kg (95 lb 9.6 oz)    Examination:  General exam: Appears calm and comfortable.Frail Respiratory system: Poor to fair air movement. Respiratory effort normal. Cardiovascular system: Tachycardia. No JVD, murmurs, rubs, gallops or clicks. No pedal edema. Gastrointestinal system: Abdomen is nondistended, soft and nontender. No organomegaly or masses felt. Normal bowel sounds heard. Central nervous system: Alert and oriented. No focal neurological deficits. Extremities: Symmetric 5 x 5 power. Skin: No rashes, lesions or ulcers Psychiatry: Judgement and insight appear normal. Mood & affect appropriate.     Data Reviewed: I have personally reviewed following labs and imaging studies  CBC:  Recent Labs Lab 01/03/17 1731 01/03/17 1743 01/04/17 0208 01/05/17 0409 01/06/17 0333 01/07/17 0440  WBC 9.3  --  7.8 9.8 11.4* 10.5  NEUTROABS 4.7  --   --   --   --   --   HGB 13.5 16.7* 12.5 12.6 12.3 12.1  HCT 41.1 49.0* 38.1 39.3 39.5 39.4  MCV 98.3  --  97.7 99.2 100.3* 101.0*  PLT 522*  --  421* 443* 495* 616*   Basic Metabolic Panel:  Recent Labs Lab 01/05/17 0409 01/06/17 0333 01/07/17 0440 01/08/17 0346 01/09/17 0530  NA 137 137 138 138 136  K 4.3 4.6 4.5 4.9 4.7  CL 93* 91* 89* 86* 88*  CO2 38* 40* 45* 44* 43*  GLUCOSE 215* 190* 168* 176* 281*  BUN 15 21* 20 21* 22*  CREATININE 0.69 0.63 0.54 0.57 0.63  CALCIUM 9.1 9.0 9.0 8.5* 8.8*  MG 2.0 1.9  --   --   --      GFR: Estimated Creatinine Clearance: 49.3 mL/min (by C-G formula based on SCr of 0.63 mg/dL). Liver Function Tests: No results for input(s): AST, ALT, ALKPHOS, BILITOT, PROT, ALBUMIN in the last 168 hours. No results for input(s): LIPASE, AMYLASE in the last 168 hours. No results for input(s): AMMONIA in the last 168 hours. Coagulation Profile: No results for input(s): INR, PROTIME in the last 168 hours. Cardiac Enzymes: No results for input(s): CKTOTAL, CKMB, CKMBINDEX, TROPONINI in the last 168 hours. BNP (last 3 results) No results for input(s): PROBNP in the last 8760 hours. HbA1C: No results for input(s): HGBA1C in the last 72 hours. CBG:  Recent Labs Lab 01/09/17 1111 01/09/17 1639 01/09/17 2217 01/10/17 0741 01/10/17 1159  GLUCAP 140* 181* 267* 119* 178*   Lipid Profile: No results for input(s): CHOL, HDL, LDLCALC, TRIG, CHOLHDL, LDLDIRECT in the last 72 hours. Thyroid Function Tests: No results for input(s): TSH, T4TOTAL, FREET4, T3FREE, THYROIDAB in  the last 72 hours. Anemia Panel: No results for input(s): VITAMINB12, FOLATE, FERRITIN, TIBC, IRON, RETICCTPCT in the last 72 hours. Sepsis Labs: No results for input(s): PROCALCITON, LATICACIDVEN in the last 168 hours.  Recent Results (from the past 240 hour(s))  Blood culture (routine x 2)     Status: None   Collection Time: 01/04/17 12:20 AM  Result Value Ref Range Status   Specimen Description BLOOD LEFT ARM  Final   Special Requests BOTTLES DRAWN AEROBIC AND ANAEROBIC 5CC  Final   Culture NO GROWTH 5 DAYS  Final   Report Status 01/09/2017 FINAL  Final  Blood culture (routine x 2)     Status: None   Collection Time: 01/04/17 12:20 AM  Result Value Ref Range Status   Specimen Description BLOOD LEFT HAND  Final   Special Requests IN PEDIATRIC BOTTLE 4CC  Final   Culture NO GROWTH 5 DAYS  Final   Report Status 01/09/2017 FINAL  Final  MRSA PCR Screening     Status: Abnormal   Collection Time: 01/04/17  1:08  PM  Result Value Ref Range Status   MRSA by PCR POSITIVE (A) NEGATIVE Final    Comment:        The GeneXpert MRSA Assay (FDA approved for NASAL specimens only), is one component of a comprehensive MRSA colonization surveillance program. It is not intended to diagnose MRSA infection nor to guide or monitor treatment for MRSA infections. RESULT CALLED TO, READ BACK BY AND VERIFIED WITH: Jean Rosenthal RN 16:25 01/04/17 ([email or web address])          Radiology Studies: No results found.      Scheduled Meds: . arformoterol  15 mcg Nebulization BID  . aspirin EC  81 mg Oral Daily  . budesonide (PULMICORT) nebulizer solution  0.5 mg Nebulization BID  . Chlorhexidine Gluconate Cloth  6 each Topical Q0600  . diltiazem  240 mg Oral Daily  . enoxaparin (LOVENOX) injection  30 mg Subcutaneous Q24H  . feeding supplement (ENSURE ENLIVE)  237 mL Oral QID  . fluticasone  2 spray Each Nare Daily  . insulin aspart  0-9 Units Subcutaneous TID WC  . ipratropium  0.5 mg Nebulization TID  . levalbuterol  0.63 mg Nebulization TID  . levofloxacin  750 mg Oral Q48H  . loratadine  10 mg Oral Daily  . mupirocin ointment  1 application Nasal BID  . pantoprazole  40 mg Oral Q0600  . polyethylene glycol  17 g Oral BID  . predniSONE  60 mg Oral BID WC  . senna-docusate  1 tablet Oral BID   Continuous Infusions:   LOS: 6 days    Time spent: 84 minutes    THOMPSON,DANIEL, MD [facility] Pager [phone]  If 7PM-7AM, please contact night-coverage [email or web address] Password [email or web address] 01/10/2017, 1:28 PM

## 2017-01-11 DIAGNOSIS — R51 Headache: Secondary | ICD-10-CM

## 2017-01-11 LAB — GLUCOSE, CAPILLARY
GLUCOSE-CAPILLARY: 113 mg/dL — AB (ref 65–99)
Glucose-Capillary: 174 mg/dL — ABNORMAL HIGH (ref 65–99)
Glucose-Capillary: 130 mg/dL — ABNORMAL HIGH (ref 65–99)

## 2017-01-11 MED ORDER — IPRATROPIUM BROMIDE 0.02 % IN SOLN: 0.5000 mg | RESPIRATORY_TRACT | 3 refills | Status: DC | PRN

## 2017-01-11 MED ORDER — PREDNISONE 20 MG PO TABS: 60.0000 mg | ORAL_TABLET | Freq: Every day | ORAL | Status: DC

## 2017-01-11 MED ORDER — LEVALBUTEROL TARTRATE 45 MCG/ACT IN AERO: 1.0000 | INHALATION_SPRAY | RESPIRATORY_TRACT | 3 refills | Status: DC | PRN

## 2017-01-11 MED ORDER — DILTIAZEM HCL ER COATED BEADS 240 MG PO CP24: 240.0000 mg | ORAL_CAPSULE | Freq: Every day | ORAL | 0 refills | Status: DC

## 2017-01-11 MED ORDER — FLUTICASONE PROPIONATE 50 MCG/ACT NA SUSP: 2.0000 | Freq: Every day | NASAL | 0 refills | Status: DC

## 2017-01-11 MED ORDER — PANTOPRAZOLE SODIUM 40 MG PO TBEC: 40.0000 mg | DELAYED_RELEASE_TABLET | Freq: Every day | ORAL | 0 refills | Status: DC

## 2017-01-11 MED ORDER — PREDNISONE 20 MG PO TABS: 20.0000 mg | ORAL_TABLET | Freq: Every day | ORAL | 0 refills | Status: DC

## 2017-01-11 MED ORDER — POLYETHYLENE GLYCOL 3350 17 G PO PACK: 17.0000 g | PACK | Freq: Two times a day (BID) | ORAL | 0 refills | Status: DC

## 2017-01-11 MED ORDER — ORAL CARE MOUTH RINSE
15.0000 mL | Freq: Two times a day (BID) | OROMUCOSAL | Status: DC
  Administered 2017-01-11: 15 mL via OROMUCOSAL

## 2017-01-11 MED ORDER — LEVALBUTEROL HCL 0.63 MG/3ML IN NEBU: 0.6300 mg | INHALATION_SOLUTION | RESPIRATORY_TRACT | 3 refills | Status: DC | PRN

## 2017-01-11 MED ORDER — ENSURE ENLIVE PO LIQD: 237.0000 mL | Freq: Four times a day (QID) | ORAL | 0 refills | Status: DC

## 2017-01-11 MED ORDER — SENNOSIDES-DOCUSATE SODIUM 8.6-50 MG PO TABS: 1.0000 | ORAL_TABLET | Freq: Two times a day (BID) | ORAL | Status: DC

## 2017-01-11 MED ORDER — BUTALBITAL-APAP-CAFFEINE 50-325-40 MG PO TABS: 1.0000 | ORAL_TABLET | Freq: Two times a day (BID) | ORAL | 0 refills | Status: DC | PRN

## 2017-01-11 MED ORDER — LORATADINE 10 MG PO TABS: 10.0000 mg | ORAL_TABLET | Freq: Every day | ORAL | 0 refills | Status: DC

## 2017-01-11 NOTE — Care Management Important Message (Signed)
Important Message  Patient Details  Name: Megan [name] MRN: [id number] Date of Birth: 11-Feb-1951   Medicare Important Message Given:  Yes    Bethena Roys, RN 01/11/2017, 12:10 PM

## 2017-01-11 NOTE — Care Management Note (Addendum)
Case Management Note  Patient Details  Name: Megan [name] MRN: [id number] Date of Birth: 1951-08-21  Subjective/Objective:   Pt presented for Acute respiratory failure with hypoxia. Pt has 02 via [facility]. CM did call Alissa @ [facility] to have an extra 02 tank delivered before d/c to hospital. [place] [facility] to contact patient in regards to 02 delivery. Agency wanted to see if family can bring tank for travel. Pt will need DME RW and shower stool. Pt is agreeable to DME from [facility]. CM did make referral with [facility] and DME to be delivered to patients room.                Action/Plan: CSW assisting with social needs. No further needs from CM at this time.   Expected Discharge Date:                  Expected Discharge Plan:  Home/Self Care  In-House Referral:  NA  Discharge planning Services  NA  Post Acute Care Choice:  NA Choice offered to:  Patient  DME Arranged:  Walker rolling, Shower stool DME Agency:  [place]:  NA [place] Agency:  NA  Status of Service:  Completed, signed off  If discussed at [place] of Stay Meetings, dates discussed:    Additional Comments:  Bethena Roys, RN 01/11/2017, 11:46 AM

## 2017-01-11 NOTE — Progress Notes (Signed)
Pt has D/C home order written at 1637. States her ride will be available for pick up around 1900. All D/C instructions and medications list given and explained. Questions answered for clarifications. Verbalized and demonstrated understanding. Telemetry removed and assisted with grooming. Informed this RN ride will be rather in around "9pm" and that daughter will bring her "another pant" in exchange of what she currently has on; also an extra oxygen thank in case she "ran out " of supply she carried from home. Hand off report and Scripts given to oncoming nurse.
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
## 2017-01-11 NOTE — Progress Notes (Signed)
Occupational Therapy Treatment Patient Details Name: Megan [name] MRN: [id number] DOB: 01/15/1951 Today's Date: 01/11/2017    History of present illness Patient is a 66 y/o female with hx of MI, COPD, depression, HTN, anxiety presents with SOB. CT showed possible PNA. Found to be tachycardic. ABG showed hypercarbic respiratory failure.    OT comments  Completed education regarding theraband strengthening HEP. Completed BSC trasnfer @ modified independent level. Set up with ADL. Pt safe to DC home when medically stable.   Follow Up Recommendations  No OT follow up    Equipment Recommendations  Tub/shower seat    Recommendations for Other Services      Precautions / Restrictions Precautions Precautions: Fall Precaution Comments: watch HR, 02 sats       Mobility Bed Mobility Overal bed mobility: Modified Independent                Transfers Overall transfer level: Modified independent     Sit to Stand: Modified independent (Device/Increase time)              Balance                                   ADL                    set up with LB ADL       Toilet Transfer: Supervision/safety;BSC   Toileting- Clothing Manipulation and Hygiene: Set up;Sit to/from stand                Vision                     Perception     Praxis      Cognition   Behavior During Therapy: Anxious Overall Cognitive Status: Within Functional Limits for tasks assessed                         Exercises Other Exercises Other Exercises: Educated on UB level 1 theraband strengthening ex. Pt able to return demonstrate   Shoulder Instructions       General Comments      Pertinent Vitals/ Pain       Pain Assessment: No/denies pain  Home Living Family/patient expects to be discharged to:: Private residence Living Arrangements: Children Available Help at Discharge: Family;Available 24 hours/day Type of Home: House Home Access:  Level entry     Home Layout: One level                          Prior Functioning/Environment              Frequency  Min 2X/week        Progress Toward Goals  OT Goals(current goals can now be found in the care plan section)  Progress towards OT goals: Progressing toward goals  Acute Rehab OT Goals Patient Stated Goal: to get stronger OT Goal Formulation: With patient Time For Goal Achievement: 01/21/17 Potential to Achieve Goals: Good ADL Goals Pt Will Perform Grooming: with modified independence;standing Pt Will Perform Upper Body Bathing: with modified independence;standing Pt Will Perform Lower Body Bathing: with modified independence;sit to/from stand Pt Will Perform Tub/Shower Transfer: Tub transfer;with modified independence;ambulating Additional ADL Goal #1: Pt will independently verbally recall 3 energy conservation strategies and use during ADL.  Plan Discharge plan remains appropriate  Co-evaluation                 End of Session Equipment Utilized During Treatment: Oxygen  OT Visit Diagnosis: Muscle weakness (generalized) (M62.81)   Activity Tolerance Patient tolerated treatment well   Patient Left in bed;with call bell/phone within reach   Nurse Communication Mobility status        Time: 6168-3729 OT Time Calculation (min): 13 min  Charges: OT General Charges $OT Visit: 1 Procedure OT Treatments $Therapeutic Activity: 8-22 mins  [facility], OT/L  [phone] 01/11/2017   Apolo Cutshaw,HILLARY 01/11/2017, 4:17 PM

## 2017-01-11 NOTE — Discharge Summary (Signed)
Physician Discharge Summary  Megan [name] BJY:[id number] DOB: 1951/03/27 DOA: 01/03/2017  PCP: Mauricio Po, FNP  Admit date: 01/03/2017 Discharge date: 01/11/2017  Time spent: 65 minutes  Recommendations for Outpatient Follow-up:  1. Follow-up with Mauricio Po, FNP in 2 weeks. On follow-up patient will need a basic metabolic profile done to follow-up on electrolytes and renal function. Patient on the day CBC done to follow-up on H&H. Patient's tachycardia need to be reassessed as patient has been started on Cardizem CD 240 mg daily. Patient needs repeat thyroid function studies done in about 4-6 weeks. 2. Follow-up with Eric Form, NP pulmonary on 01/24/2017 for follow-up on COPD exacerbation.   Discharge Diagnoses:  Principal Problem:   Acute respiratory failure with hypoxia and hypercarbia (HCC) Active Problems:   Acute exacerbation of chronic obstructive pulmonary disease (COPD) (HCC)   Essential hypertension   COPD exacerbation (HCC)   Protein-calorie malnutrition, severe   Esophageal reflux   CAP (community acquired pneumonia)   Acute respiratory failure with hypoxia and hypercapnia (HCC)   Hyperglycemia   Tachycardia   Discharge Condition: Stable and improved.  Diet recommendation: Regular  Filed Weights   01/09/17 0016 01/10/17 0343 01/11/17 0600  Weight: 42.3 kg (93 lb 3.2 oz) 43.4 kg (95 lb 9.6 oz) 43.6 kg (96 lb 1.6 oz)    History of present illness:  Per Dr Mallie Darting is a 66 y.o. female with severe COPD on home oxygen and anxiety presented to the ER because of shortness of breath. Patient stated she has been short of breath for the last 1 week which has progressively gotten worse even at rest. She had been having productive cough. Denied any fever chills or chest pain.   ED Course: In the ER patient is found to be tachycardic short of breath. Patient had CAT scan of the chest which shows possible pneumonia. Patient also was wheezing on exam.  Patient was placed on steroids nebulizer and antibiotics. Patient being admitted for further management. ABG shows hypercarbic respiratory failure. But patient at this time not in distress  Hospital Course:  #1 acute respiratory failure with hypoxia and hypercarbia secondary to acute COPD exacerbation and probable community-acquired pneumonia Patient Was admitted with acute respiratory failure with hypoxia and hypercarbia felt to be secondary to acute COPD exacerbation and probable community-acquired pneumonia. Patient improved slowly during the hospitalization. Fever curve trended down. Patient was maintained on Pulmicort and Brovana, scheduled Xopenex and Atrovent nebulizers, oral Levaquin, Claritin, PPI,  IV steroid taper. Patient was subsequently transitioned to oral prednisone. Patient improved clinically. Patient be discharged on a slow steroid taper. Patient will follow-up with pulmonary in the outpatient setting. Patient also follow-up with PCP. Patient finished the course of antibiotic treatment. Outpatient follow-up.   #2 community-acquired pneumonia Per CT chest. Patient fever curve improved during the hospitalization. Patient was pancultured with no growth to date. Patient was placed empirically on IV Levaquin and subsequently transitioned to oral Levaquin as well as his COPD management. Patient improved clinically and completed a course of antibiotic treatment during the hospitalization. Outpatient follow-up.   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 Patient had 2-D echo done 08/22/2015 to evaluate tachycardia and noted at that time to have a EF of 55-60% with hypokinesis of the mid inferior lateral and inferior myocardium. EKG with a sinus tachycardia /?MAT. TSH was low. Free T4 and free T3 within normal limits and as such abnormal TSH likely secondary to  euthyroid sick syndrome. Patient will need repeat thyroid study  functions done in about 4-6 weeks. Patient was started on a calcium channel blocker Cardizem and dose adjusted for better rate control. Patient's heart rate improved on Cardizem CD 240 mg daily. Outpatient follow-up.  #4 gastroesophageal reflux disease Patient was maintained on a PPI.  #5 severe protein calorie malnutrition Continued on nutritional supplementation.  #6 hyperglycemia Likely steroid-induced. Hemoglobin A1c = 5.3. Patient was placed on sliding scale insulin.  #7 hypertension Patient was placed on cardizem for tachycardia. BP improved on cardizem.     Procedures:  CT angiogram chest 01/03/2017  Chest x-ray 01/03/2017  CT head 01/07/2017  Consultations:  None  Discharge Exam: Vitals:   01/11/17 0600 01/11/17 0934  BP: (!) 161/75   Pulse:    Resp:    Temp: 98.9 F (37.2 C) 98.5 F (36.9 C)    General: NAD Cardiovascular: RRR Respiratory: CTAB  Discharge Instructions   Discharge Instructions    Diet general    Complete by:  As directed    Increase activity slowly    Complete by:  As directed      Current Discharge Medication List    START taking these medications   Details  diltiazem (CARDIZEM CD) 240 MG 24 hr capsule Take 1 capsule (240 mg total) by mouth daily. Qty: 30 capsule, Refills: 0    feeding supplement, ENSURE ENLIVE, (ENSURE ENLIVE) LIQD Take 237 mLs by mouth 4 (four) times daily. Qty: 237 mL, Refills: 0    fluticasone (FLONASE) 50 MCG/ACT nasal spray Place 2 sprays into both nostrils daily. Qty: 16 g, Refills: 0    ipratropium (ATROVENT) 0.02 % nebulizer solution Take 2.5 mLs (0.5 mg total) by nebulization every 4 (four) hours as needed for wheezing or shortness of breath. Qty: 150 mL, Refills: 3    levalbuterol (XOPENEX HFA) 45 MCG/ACT inhaler Inhale 1-2 puffs into the lungs every 4 (four) hours as needed for wheezing. Qty: 1 Inhaler, Refills: 3    levalbuterol (XOPENEX) 0.63 MG/3ML nebulizer solution Take 3 mLs  (0.63 mg total) by nebulization every 4 (four) hours as needed for wheezing. Qty: 150 mL, Refills: 3    loratadine (CLARITIN) 10 MG tablet Take 1 tablet (10 mg total) by mouth daily. Qty: 30 tablet, Refills: 0    pantoprazole (PROTONIX) 40 MG tablet Take 1 tablet (40 mg total) by mouth daily at 6 (six) AM. Qty: 30 tablet, Refills: 0    polyethylene glycol (MIRALAX / GLYCOLAX) packet Take 17 g by mouth 2 (two) times daily. Qty: 14 each, Refills: 0    predniSONE (DELTASONE) 20 MG tablet Take 1-3 tablets (20-60 mg total) by mouth daily with breakfast. Take 3 tablets ('60mg'$ )daily x 3 days, then 2 tablets ('40mg'$ ) daily x 3 days, then 1 tablet('20mg'$ )daily x 3 days then stop. Qty: 18 tablet, Refills: 0    senna-docusate (SENOKOT-S) 8.6-50 MG tablet Take 1 tablet by mouth 2 (two) times daily.      CONTINUE these medications which have CHANGED   Details  butalbital-acetaminophen-caffeine (FIORICET, ESGIC) 50-325-40 MG tablet Take 1 tablet by mouth 2 (two) times daily as needed for headache. Qty: 15 tablet, Refills: 0   Associated Diagnoses: Mixed headache      CONTINUE these medications which have NOT CHANGED   Details  acetaminophen (TYLENOL) 500 MG tablet Take 500 mg by mouth every 6 (six) hours as needed for mild pain.    ALPRAZolam (XANAX) 0.25 MG tablet Take 1 tablet (0.25  mg total) by mouth 2 (two) times daily as needed for anxiety. Qty: 60 tablet, Refills: 0   Associated Diagnoses: Chronic post-traumatic stress disorder (PTSD)    aspirin EC 81 MG tablet Take 81 mg by mouth daily.    Diclofenac Sodium (PENNSAID) 2 % SOLN Place 1 application onto the skin 2 (two) times daily as needed. Apply to right shoulder Qty: 2 g, Refills: 0   Associated Diagnoses: Right anterior shoulder pain    Fluticasone-Salmeterol (ADVAIR) 500-50 MCG/DOSE AEPB Inhale 1 puff into the lungs 2 (two) times daily. Qty: 60 each, Refills: 3    INCRUSE ELLIPTA 62.5 MCG/INH AEPB INHALE 1 PUFF INTO THE LUNGS  DAILY Qty: 30 each, Refills: 2    lidocaine (LIDODERM) 5 % Place 0.25 patches onto the skin daily as needed (pain). Remove & Discard patch within 12 hours or as directed by MD    MELATONIN PO Take 1 tablet by mouth at bedtime as needed (sleep).    Polyethyl Glycol-Propyl Glycol (SYSTANE OP) Place 1 drop into both eyes every 6 (six) hours as needed (dry eyes). Reported on 05/19/2016    promethazine (PHENERGAN) 12.5 MG tablet Take 12.5 mg by mouth every 6 (six) hours as needed for nausea or vomiting. Reported on 05/19/2016      STOP taking these medications     azithromycin (ZITHROMAX) 250 MG tablet      docusate sodium (COLACE) 100 MG capsule      ipratropium-albuterol (DUONEB) 0.5-2.5 (3) MG/3ML SOLN      VENTOLIN HFA 108 (90 Base) MCG/ACT inhaler        Allergies  Allergen Reactions  . Penicillins Rash    Has patient had a PCN reaction causing immediate rash, facial/tongue/throat swelling, SOB or lightheadedness with hypotension: No Has patient had a PCN reaction causing severe rash involving mucus membranes or skin necrosis:NO Has patient had a PCN reaction that required hospitalization No Has patient had a PCN reaction occurring within the last 10 years:NO If all of the above answers are "NO", then may proceed with Cephalosporin use.   Follow-up [facility].   Why:  [place] [place], [employer].  Contact information: [place] [address] [phone]        Magdalen Spatz, NP Follow up on 01/24/2017.   Specialty:  Pulmonary Disease Why:  F/U AT 1200PM Contact information: [address] [place] [address] [phone]        Mauricio Po, [place]. Schedule an appointment as soon as possible for a visit in 2 week(s).   Specialty:  Family Medicine Contact information: [place] [address] [phone]            The results of significant diagnostics from this  hospitalization (including imaging, microbiology, ancillary and laboratory) are listed below for reference.    Significant Diagnostic Studies: Dg Chest 2 View  Result Date: 01/03/2017 CLINICAL DATA:  66 year old female with shortness of Breath, no improvement with home breathing treatments. Decreased breath sounds on auscultation. Initial encounter. EXAM: CHEST  2 VIEW COMPARISON:  02/25/2016 and earlier. FINDINGS: Upright AP and lateral views of the chest. Chronic pulmonary hyperinflation. Larger lung volumes compared to 2017. Mediastinal contours are stable and normal aside from cephalad mild hilar retraction. No pneumothorax or pulmonary edema. No pleural effusions suspected. Mildly increased chronic pulmonary interstitial opacity. Incidental left nipple shadow. No confluent pulmonary opacity. No acute osseous abnormality  identified. Negative visible bowel gas pattern. IMPRESSION: Chronic pulmonary hyperinflation with increased lung volumes and mildly increased interstitial opacity compared to 2017. Consider viral or atypical respiratory infection. No pleural effusion. Electronically Signed   By: Genevie Ann M.D.   On: 01/03/2017 18:06   Ct Head Wo Contrast  Result Date: 01/07/2017 CLINICAL DATA:  Posterior headache. Difficulty controlling headache. EXAM: CT HEAD WITHOUT CONTRAST TECHNIQUE: Contiguous axial images were obtained from the base of the skull through the vertex without intravenous contrast. COMPARISON:  None. FINDINGS: Brain: No evidence of acute infarction, hemorrhage, hydrocephalus, extra-axial collection or mass lesion/mass effect. Mild right frontal periventricular white matter low attenuation likely related to microvascular disease. Vascular: No hyperdense vessel or unexpected calcification. Skull: No osseous abnormality. Sinuses/Orbits: Visualized paranasal sinuses are clear. Visualized mastoid sinuses are clear. Visualized orbits demonstrate no focal abnormality. Other: None IMPRESSION: No  acute intracranial pathology. Electronically Signed   By: Kathreen Devoid   On: 01/07/2017 17:39   Ct Angio Chest Pe W Or Wo Contrast  Result Date: 01/03/2017 CLINICAL DATA:  Dyspnea and altered responsiveness. EXAM: CT ANGIOGRAPHY CHEST WITH CONTRAST TECHNIQUE: Multidetector CT imaging of the chest was performed using the standard protocol during bolus administration of intravenous contrast. Multiplanar CT image reconstructions and MIPs were obtained to evaluate the vascular anatomy. CONTRAST:  60 mL Isovue 370 intravenous COMPARISON:  Radiographs 01/03/2017, CT 08/27/2015 FINDINGS: Cardiovascular: Satisfactory opacification of the pulmonary arteries to the segmental level. No evidence of pulmonary embolism. Normal heart size. No pericardial effusion. Mediastinum/Nodes: No enlarged mediastinal, hilar, or axillary lymph nodes. Thyroid gland, trachea, and esophagus demonstrate no significant findings. Lungs/Pleura: Marked hyperinflation. Severe centrilobular emphysematous disease. Scattered stable linear and nodular opacities at the periphery of both lungs. New patchy consolidation in the lateral right lower lobe base and in the central-lateral left lung base, suspicious for pneumonia. Airways are patent. No pleural effusion. Upper Abdomen: Small hiatal hernia.  No acute findings. Musculoskeletal: No significant skeletal lesion. Review of the MIP images confirms the above findings. IMPRESSION: 1. Negative for acute pulmonary embolism. 2. Patchy consolidation in both lung bases, right greater than left, superimposed on severe emphysematous disease. This may represent pneumonia. 3. Small hiatal hernia. Electronically Signed   By: Andreas Newport M.D.   On: 01/03/2017 23:39    Microbiology: Recent Results (from the past 240 hour(s))  Blood culture (routine x 2)     Status: None   Collection Time: 01/04/17 12:20 AM  Result Value Ref Range Status   Specimen Description BLOOD LEFT ARM  Final   Special Requests  BOTTLES DRAWN AEROBIC AND ANAEROBIC 5CC  Final   Culture NO GROWTH 5 DAYS  Final   Report Status 01/09/2017 FINAL  Final  Blood culture (routine x 2)     Status: None   Collection Time: 01/04/17 12:20 AM  Result Value Ref Range Status   Specimen Description BLOOD LEFT HAND  Final   Special Requests IN PEDIATRIC BOTTLE 4CC  Final   Culture NO GROWTH 5 DAYS  Final   Report Status 01/09/2017 FINAL  Final  MRSA PCR Screening     Status: Abnormal   Collection Time: 01/04/17  1:08 PM  Result Value Ref Range Status   MRSA by PCR POSITIVE (A) NEGATIVE Final    Comment:        The GeneXpert MRSA Assay (FDA approved for NASAL specimens only), is one component of a comprehensive MRSA colonization surveillance program. It is not intended to diagnose MRSA infection  nor to guide or monitor treatment for MRSA infections. RESULT CALLED TO, READ BACK BY AND VERIFIED WITH: Jean Rosenthal RN 16:25 01/04/17 ([email or web address])      Labs: Basic Metabolic Panel:  Recent Labs Lab 01/05/17 0409 01/06/17 0333 01/07/17 0440 01/08/17 0346 01/09/17 0530  NA 137 137 138 138 136  K 4.3 4.6 4.5 4.9 4.7  CL 93* 91* 89* 86* 88*  CO2 38* 40* 45* 44* 43*  GLUCOSE 215* 190* 168* 176* 281*  BUN 15 21* 20 21* 22*  CREATININE 0.69 0.63 0.54 0.57 0.63  CALCIUM 9.1 9.0 9.0 8.5* 8.8*  MG 2.0 1.9  --   --   --    Liver Function Tests: No results for input(s): AST, ALT, ALKPHOS, BILITOT, PROT, ALBUMIN in the last 168 hours. No results for input(s): LIPASE, AMYLASE in the last 168 hours. No results for input(s): AMMONIA in the last 168 hours. CBC:  Recent Labs Lab 01/05/17 0409 01/06/17 0333 01/07/17 0440  WBC 9.8 11.4* 10.5  HGB 12.6 12.3 12.1  HCT 39.3 39.5 39.4  MCV 99.2 100.3* 101.0*  PLT 443* 495* 520*   Cardiac Enzymes: No results for input(s): CKTOTAL, CKMB, CKMBINDEX, TROPONINI in the last 168 hours. BNP: BNP (last 3 results)  Recent Labs  02/12/16 1645 01/03/17 1656  BNP 132.4* 56.5    ProBNP  (last 3 results) No results for input(s): PROBNP in the last 8760 hours.  CBG:  Recent Labs Lab 01/10/17 1614 01/10/17 2154 01/11/17 0733 01/11/17 1125 01/11/17 1642  GLUCAP 152* 180* 113* 174* 130*       Signed:  Jury Caserta MD.  [facility] 01/11/2017, 4:56 PM

## 2017-01-19 ENCOUNTER — Telehealth: Payer: Self-pay | Admitting: Pulmonary Disease

## 2017-01-19 MED ORDER — PREDNISONE 10 MG PO TABS: ORAL_TABLET | ORAL | 0 refills | Status: DC

## 2017-01-19 NOTE — Telephone Encounter (Signed)
Called and spoke to pt. Informed her of the recs per SG. Pt verbalized understanding. Offered appt with MW on 3.22.18 but pt states she is unable to come in sooner d/t scheduling issues. Pred taper sent to preferred pharmacy. Pt verbalized understanding and is aware to call back or seek emergency care if s/s were to worsen. Nothing further needed at this time.

## 2017-01-19 NOTE — Telephone Encounter (Signed)
Can she  be scheduled for her follow up before 3/26? Sounds like she needs to be seen sooner. If unable to schedule sooner please send in Prednisone taper; 10 mg tablets:  3 tabs x 2 days, 2 tabs x 2 days 1 tab x 2 days then stop. Thanks

## 2017-01-19 NOTE — Telephone Encounter (Signed)
Pt recently admitted 01-03-17 and d/c 01-11-17. Pt scheduled for hosp f/u on 01-24-17, pt was d/c with prednisone taper. Pt will take her last dose today. pt is requesting to extend prednisone taper until her OV with SG on 3-26. Pt reports of cont sob, mild chest tightness & wheezing. Pt previously taper was 3 tabs x 3 days,  2 tabs x 3 days, 1 tab x 3 days  SG please advise. Thanks.

## 2017-01-24 ENCOUNTER — Encounter: Payer: Self-pay | Admitting: Acute Care

## 2017-01-24 ENCOUNTER — Ambulatory Visit (INDEPENDENT_AMBULATORY_CARE_PROVIDER_SITE_OTHER): Payer: Medicare Other | Admitting: Acute Care

## 2017-01-24 ENCOUNTER — Ambulatory Visit (INDEPENDENT_AMBULATORY_CARE_PROVIDER_SITE_OTHER)
Admission: RE | Admit: 2017-01-24 | Discharge: 2017-01-24 | Disposition: A | Discharge status: Home / Self Care | Payer: Medicare Other | Source: Ambulatory Visit | Attending: Acute Care | Admitting: Acute Care

## 2017-01-24 VITALS — BP 130/60 | HR 92 | Ht 63.0 in | Wt 94.6 lb

## 2017-01-24 DIAGNOSIS — J441 Chronic obstructive pulmonary disease with (acute) exacerbation: Secondary | ICD-10-CM | POA: Diagnosis not present

## 2017-01-24 DIAGNOSIS — E43 Unspecified severe protein-calorie malnutrition: Secondary | ICD-10-CM

## 2017-01-24 DIAGNOSIS — J449 Chronic obstructive pulmonary disease, unspecified: Secondary | ICD-10-CM | POA: Diagnosis not present

## 2017-01-24 NOTE — Assessment & Plan Note (Signed)
Continue boost/ensure as meal supplement

## 2017-01-24 NOTE — Patient Instructions (Addendum)
It is nice to meet you today. CXR today. We will call you with results. Please let us know if you would like [place] to refer you to Pulmonary Rehab. Slowly increase your activity. Continue using Ensure meal supplements. Complete your prednisone taper that was prescribed 01/19/2017. Continue your Advair and Incruse. Xopenex rescue inhaler as needed up to every 6 hours as needed for shortness of breath or wheezing. Continue working with your insurance company to get your Xopenex neb treatments as ordered on discharge. Add Mucinex 1200 mg once daily I am  for chest congestion Take with a full glass of water. 3 Month follow up with Dr. Lamonte Sakai. Please contact office for sooner follow up if symptoms do not improve or worsen or seek emergency care

## 2017-01-24 NOTE — Progress Notes (Signed)
History of Present Illness Megan [name] is a 66 y.o. female former smoker with severe anxiety,  COPD, on home oxygen. She is followed by Dr. Lamonte Sakai.   01/24/2017 Hospital Follow Up: Pt presents for hospital follow up. She was hospitalized 01/03/2017-01/11/2017 for severe exacerbation of her COPD, in addition to a probable community-acquired pneumonia. She slowly improved during  hospitalization. She was treated with Levaquin, Pulmicort and Brovana, scheduled Xopenex and Atrovent nebulizers, Claritin, PPI, and IV steroid taper. She was transitioned to oral prednisone as she improved clinically. She was then discharged on the steroid taper. Antibiotic course had been completed during hospitalization. She did call the office on 3/21 requesting additional steroid. She was given an additional 9 day taper at that time. She presents today for follow-up. She states she is still weak and deconditioned. She is currently on her 2 L [place] with adequate saturations.We discussed Pulmonary rehab.She would prefer to do this at home at this time.She is compliant with her prednisone taper.She is compliant with her Advair and Incruse Daily. She is currently not using her Albuterol nebs due to heart rate issues. Heart rate was an issue during this admission, and she has been started on Cardizem which is being followed and maintained by her PCP.She does have a  xopenex inhaler, but is fighting with insurance over nebs.She states she rarely coughs up secretions.She states the secretions she does cough up are white cloudy to clear. She states her wheezing resolved with second prednisone taper which she is continuing to complete. She denies fever, chest pain, orthopnea , hemoptysis.  Tests  Portable chest x-ray 01/24/2017: COPD without acute abnormality.  CT angiogram 01/03/2017 IMPRESSION: 1. Negative for acute pulmonary embolism. 2. Patchy consolidation in both lung bases, right greater than left, superimposed on severe  emphysematous disease. This may represent pneumonia. 3. Small hiatal hernia.   Past medical hx Past Medical History:  Diagnosis Date  . Anemia   . Anxiety state 08/20/2015  . CAP (community acquired pneumonia)   . CHF (congestive heart failure) ([place])   . COPD (chronic obstructive pulmonary disease) ([place])   . Depression   . Essential hypertension 08/19/2015  . GERD (gastroesophageal reflux disease)   . Headache   . History of hiatal hernia   . Myocardial infarction   . Shortness of breath dyspnea      Past surgical hx, Family hx, Social hx all reviewed.  Current Outpatient Prescriptions on File Prior to Visit  Medication Sig  . acetaminophen (TYLENOL) 500 MG tablet Take 500 mg by mouth every 6 (six) hours as needed for mild pain.  Marland Kitchen ALPRAZolam (XANAX) 0.25 MG tablet Take 1 tablet (0.25 mg total) by mouth 2 (two) times daily as needed for anxiety.  Marland Kitchen aspirin EC 81 MG tablet Take 81 mg by mouth daily.  . butalbital-acetaminophen-caffeine (FIORICET, ESGIC) 50-325-40 MG tablet Take 1 tablet by mouth 2 (two) times daily as needed for headache.  . Diclofenac Sodium (PENNSAID) 2 % SOLN Place 1 application onto the skin 2 (two) times daily as needed. Apply to right shoulder (Patient taking differently: Place 1 application onto the skin 2 (two) times daily as needed (pain). Apply to right shoulder)  . diltiazem (CARDIZEM CD) 240 MG 24 hr capsule Take 1 capsule (240 mg total) by mouth daily.  . feeding supplement, ENSURE ENLIVE, (ENSURE ENLIVE) LIQD Take 237 mLs by mouth 4 (four) times daily.  . fluticasone (FLONASE) 50 MCG/ACT nasal spray Place 2 sprays into both nostrils daily.  Marland Kitchen  Fluticasone-Salmeterol (ADVAIR) 500-50 MCG/DOSE AEPB Inhale 1 puff into the lungs 2 (two) times daily.  . INCRUSE ELLIPTA 62.5 MCG/INH AEPB INHALE 1 PUFF INTO THE LUNGS DAILY  . ipratropium (ATROVENT) 0.02 % nebulizer solution Take 2.5 mLs (0.5 mg total) by nebulization every 4 (four) hours as needed for  wheezing or shortness of breath.  . levalbuterol (XOPENEX HFA) 45 MCG/ACT inhaler Inhale 1-2 puffs into the lungs every 4 (four) hours as needed for wheezing.  . lidocaine (LIDODERM) 5 % Place 0.25 patches onto the skin daily as needed (pain). Remove & Discard patch within 12 hours or as directed by MD  . loratadine (CLARITIN) 10 MG tablet Take 1 tablet (10 mg total) by mouth daily.  Marland Kitchen MELATONIN PO Take 1 tablet by mouth at bedtime as needed (sleep).  . pantoprazole (PROTONIX) 40 MG tablet Take 1 tablet (40 mg total) by mouth daily at 6 (six) AM.  . Polyethyl Glycol-Propyl Glycol (SYSTANE OP) Place 1 drop into both eyes every 6 (six) hours as needed (dry eyes). Reported on 05/19/2016  . polyethylene glycol (MIRALAX / GLYCOLAX) packet Take 17 g by mouth 2 (two) times daily.  . predniSONE (DELTASONE) 10 MG tablet Take 3 tablets for 2 days, then take 2 tablets for 2 days, then take 1 tablet for 2 days.  . promethazine (PHENERGAN) 12.5 MG tablet Take 12.5 mg by mouth every 6 (six) hours as needed for nausea or vomiting. Reported on 05/19/2016  . senna-docusate (SENOKOT-S) 8.6-50 MG tablet Take 1 tablet by mouth 2 (two) times daily.  Marland Kitchen levalbuterol (XOPENEX) 0.63 MG/3ML nebulizer solution Take 3 mLs (0.63 mg total) by nebulization every 4 (four) hours as needed for wheezing. (Patient not taking: Reported on 01/24/2017)  . predniSONE (DELTASONE) 20 MG tablet Take 1-3 tablets (20-60 mg total) by mouth daily with breakfast. Take 3 tablets ('60mg'$ )daily x 3 days, then 2 tablets ('40mg'$ ) daily x 3 days, then 1 tablet('20mg'$ )daily x 3 days then stop. (Patient not taking: Reported on 01/24/2017)   No current facility-administered medications on file prior to visit.      Allergies  Allergen Reactions  . Penicillins Rash    Has patient had a PCN reaction causing immediate rash, facial/tongue/throat swelling, SOB or lightheadedness with hypotension: No Has patient had a PCN reaction causing severe rash involving mucus  membranes or skin necrosis:NO Has patient had a PCN reaction that required hospitalization No Has patient had a PCN reaction occurring within the last 10 years:NO If all of the above answers are "NO", then may proceed with Cephalosporin use.    Review Of Systems:  Constitutional:   No  weight loss, night sweats,  Fevers, chills, fatigue, or  lassitude.  HEENT:   No headaches,  Difficulty swallowing,  Tooth/dental problems, or  Sore throat,                No sneezing, itching, ear ache, nasal congestion, post nasal drip,   CV:  No chest pain,  Orthopnea, PND, swelling in lower extremities, anasarca, dizziness, palpitations, syncope.   GI  No heartburn, indigestion, abdominal pain, nausea, vomiting, diarrhea, change in bowel habits, loss of appetite, bloody stools.   Resp: + shortness of breath with exertion less at rest.  No excess mucus, no productive cough,  No non-productive cough,  No coughing up of blood.  No change in color of mucus.  + wheezing.  No chest wall deformity  Skin: no rash or lesions.  GU: no dysuria, change in color  of urine, no urgency or frequency.  No flank pain, no hematuria   MS:  No joint pain or swelling.  No decreased range of motion.  No back pain.  Psych:  No change in mood or affect. No depression or anxiety.  No memory loss.   Vital Signs BP 130/60 (BP Location: Left Arm, Patient Position: Sitting, Cuff Size: Small)   Pulse 92   Ht '5\' 3"'$  (1.6 m)   Wt 94 lb 9.6 oz (42.9 kg)   SpO2 95%   BMI 16.76 kg/m    Physical Exam:  General- No distress,  A&Ox 3, very anxious, thin deconditioned-appearing female wearing nasal oxygen ENT: No sinus tenderness, TM clear, pale nasal mucosa, no oral exudate,no post nasal drip, no LAN Cardiac: S1, S2, regular rate and rhythm, no murmur Chest: No wheeze/ rales/ dullness; + accessory muscle use, no nasal flaring, no sternal retractions Abd.: Soft Non-tender, flat Ext: No clubbing cyanosis, edema Neuro:  Thin  deconditioned, moving all extremities 4, alert and oriented 3, following commands Skin: No rashes, warm and dry Psych: normal mood and behavior with her baseline anxiety   Assessment/Plan  COPD exacerbation (HCC) Acute COPD exacerbation requiring hospitalization Treated with antibiotics, scheduled bronchodilators, IV steroids, oxygen Plan CXR today. We will call you with results. Continue oxygen to maintain oxygen saturations between 88-92%. Please let us know if you would like [place] to refer you to Pulmonary Rehab. Slowly increase your activity. Continue using Ensure meal supplements. Complete your prednisone taper that was prescribed 01/19/2017. Continue your Advair and Incruse. Xopenex rescue inhaler as needed up to every 6 hours as needed for shortness of breath or wheezing. Continue working with your insurance company to get your Xopenex neb treatments as ordered on discharge. Add Mucinex 1200 mg once daily I am  for chest congestion Take with a full glass of water. 3 Month follow up with Dr. Lamonte Sakai. Please contact office for sooner follow up if symptoms do not improve or worsen or seek emergency care    Protein-calorie malnutrition, severe Continue boost/ensure as meal supplement    Magdalen Spatz, NP 01/24/2017  2:11 PM

## 2017-01-24 NOTE — Assessment & Plan Note (Addendum)
Acute COPD exacerbation requiring hospitalization Treated with antibiotics, scheduled bronchodilators, IV steroids, oxygen Plan CXR today. We will call you with results. Continue oxygen to maintain oxygen saturations between 88-92%. Please let us know if you would like [place] to refer you to Pulmonary Rehab. Slowly increase your activity. Continue using Ensure meal supplements. Complete your prednisone taper that was prescribed 01/19/2017. Continue your Advair and Incruse. Xopenex rescue inhaler as needed up to every 6 hours as needed for shortness of breath or wheezing. Continue working with your insurance company to get your Xopenex neb treatments as ordered on discharge. Add Mucinex 1200 mg once daily I am  for chest congestion Take with a full glass of water. 3 Month follow up with Dr. Lamonte Sakai. Please contact office for sooner follow up if symptoms do not improve or worsen or seek emergency care

## 2017-01-25 ENCOUNTER — Telehealth: Payer: Self-pay | Admitting: Acute Care

## 2017-01-25 NOTE — Progress Notes (Signed)
Spoke with patient and informed her of results. Pt verbalized understanding and did not have any questions. Nothing further is needed.

## 2017-01-25 NOTE — Telephone Encounter (Signed)
Result Notes   Notes recorded by Tyson Dense, RN on 01/25/2017 at 3:17 PM EDT Spoke with patient and informed her of results. Pt verbalized understanding and did not have any questions. Nothing further is needed. ------  Notes recorded by Magdalen Spatz, NP on 01/24/2017 at 5:14 PM EDT Please call patient and let her know her chest x-ray showed COPD with no acute abnormality. Have her follow the plan of care we established in the office today. Thank you so much

## 2017-02-19 DIAGNOSIS — R0902 Hypoxemia: Secondary | ICD-10-CM | POA: Diagnosis not present

## 2017-02-20 ENCOUNTER — Emergency Department (HOSPITAL_COMMUNITY): Payer: Medicare Other

## 2017-02-20 ENCOUNTER — Encounter (HOSPITAL_COMMUNITY): Payer: Self-pay | Admitting: *Deleted

## 2017-02-20 ENCOUNTER — Inpatient Hospital Stay (HOSPITAL_COMMUNITY)
Admission: EM | Admit: 2017-02-20 | Discharge: 2017-03-08 | DRG: 004 | Disposition: A | Discharge status: Other Acute Inpatient Hospital | Payer: Medicare Other | Attending: Internal Medicine | Admitting: Internal Medicine

## 2017-02-20 DIAGNOSIS — E1165 Type 2 diabetes mellitus with hyperglycemia: Secondary | ICD-10-CM | POA: Diagnosis not present

## 2017-02-20 DIAGNOSIS — Z79899 Other long term (current) drug therapy: Secondary | ICD-10-CM

## 2017-02-20 DIAGNOSIS — J209 Acute bronchitis, unspecified: Secondary | ICD-10-CM | POA: Diagnosis present

## 2017-02-20 DIAGNOSIS — J440 Chronic obstructive pulmonary disease with acute lower respiratory infection: Secondary | ICD-10-CM | POA: Diagnosis present

## 2017-02-20 DIAGNOSIS — I252 Old myocardial infarction: Secondary | ICD-10-CM

## 2017-02-20 DIAGNOSIS — J441 Chronic obstructive pulmonary disease with (acute) exacerbation: Secondary | ICD-10-CM | POA: Diagnosis present

## 2017-02-20 DIAGNOSIS — Z9911 Dependence on respirator [ventilator] status: Secondary | ICD-10-CM | POA: Diagnosis not present

## 2017-02-20 DIAGNOSIS — N179 Acute kidney failure, unspecified: Secondary | ICD-10-CM | POA: Diagnosis not present

## 2017-02-20 DIAGNOSIS — R1310 Dysphagia, unspecified: Secondary | ICD-10-CM | POA: Diagnosis present

## 2017-02-20 DIAGNOSIS — R4182 Altered mental status, unspecified: Secondary | ICD-10-CM | POA: Diagnosis not present

## 2017-02-20 DIAGNOSIS — Z430 Encounter for attention to tracheostomy: Secondary | ICD-10-CM

## 2017-02-20 DIAGNOSIS — E872 Acidosis: Secondary | ICD-10-CM | POA: Diagnosis not present

## 2017-02-20 DIAGNOSIS — J189 Pneumonia, unspecified organism: Secondary | ICD-10-CM | POA: Diagnosis not present

## 2017-02-20 DIAGNOSIS — E8729 Other acidosis: Secondary | ICD-10-CM

## 2017-02-20 DIAGNOSIS — G9341 Metabolic encephalopathy: Secondary | ICD-10-CM | POA: Diagnosis present

## 2017-02-20 DIAGNOSIS — Z978 Presence of other specified devices: Secondary | ICD-10-CM

## 2017-02-20 DIAGNOSIS — Z9981 Dependence on supplemental oxygen: Secondary | ICD-10-CM

## 2017-02-20 DIAGNOSIS — D638 Anemia in other chronic diseases classified elsewhere: Secondary | ICD-10-CM | POA: Diagnosis present

## 2017-02-20 DIAGNOSIS — Z452 Encounter for adjustment and management of vascular access device: Secondary | ICD-10-CM | POA: Diagnosis not present

## 2017-02-20 DIAGNOSIS — J9610 Chronic respiratory failure, unspecified whether with hypoxia or hypercapnia: Secondary | ICD-10-CM | POA: Diagnosis not present

## 2017-02-20 DIAGNOSIS — J90 Pleural effusion, not elsewhere classified: Secondary | ICD-10-CM | POA: Diagnosis not present

## 2017-02-20 DIAGNOSIS — Z9289 Personal history of other medical treatment: Secondary | ICD-10-CM

## 2017-02-20 DIAGNOSIS — B9710 Unspecified enterovirus as the cause of diseases classified elsewhere: Secondary | ICD-10-CM | POA: Diagnosis present

## 2017-02-20 DIAGNOSIS — E440 Moderate protein-calorie malnutrition: Secondary | ICD-10-CM | POA: Diagnosis not present

## 2017-02-20 DIAGNOSIS — J9602 Acute respiratory failure with hypercapnia: Secondary | ICD-10-CM

## 2017-02-20 DIAGNOSIS — J439 Emphysema, unspecified: Secondary | ICD-10-CM | POA: Diagnosis not present

## 2017-02-20 DIAGNOSIS — J9611 Chronic respiratory failure with hypoxia: Secondary | ICD-10-CM

## 2017-02-20 DIAGNOSIS — J9600 Acute respiratory failure, unspecified whether with hypoxia or hypercapnia: Secondary | ICD-10-CM | POA: Diagnosis not present

## 2017-02-20 DIAGNOSIS — Z681 Body mass index (BMI) 19 or less, adult: Secondary | ICD-10-CM

## 2017-02-20 DIAGNOSIS — R0902 Hypoxemia: Secondary | ICD-10-CM | POA: Diagnosis not present

## 2017-02-20 DIAGNOSIS — A419 Sepsis, unspecified organism: Secondary | ICD-10-CM

## 2017-02-20 DIAGNOSIS — I509 Heart failure, unspecified: Secondary | ICD-10-CM | POA: Diagnosis present

## 2017-02-20 DIAGNOSIS — J181 Lobar pneumonia, unspecified organism: Secondary | ICD-10-CM

## 2017-02-20 DIAGNOSIS — Z4682 Encounter for fitting and adjustment of non-vascular catheter: Secondary | ICD-10-CM | POA: Diagnosis not present

## 2017-02-20 DIAGNOSIS — A403 Sepsis due to Streptococcus pneumoniae: Principal | ICD-10-CM | POA: Diagnosis present

## 2017-02-20 DIAGNOSIS — J13 Pneumonia due to Streptococcus pneumoniae: Secondary | ICD-10-CM | POA: Diagnosis present

## 2017-02-20 DIAGNOSIS — Z930 Tracheostomy status: Secondary | ICD-10-CM

## 2017-02-20 DIAGNOSIS — J9621 Acute and chronic respiratory failure with hypoxia: Secondary | ICD-10-CM | POA: Diagnosis not present

## 2017-02-20 DIAGNOSIS — J9811 Atelectasis: Secondary | ICD-10-CM | POA: Diagnosis not present

## 2017-02-20 DIAGNOSIS — J9612 Chronic respiratory failure with hypercapnia: Secondary | ICD-10-CM

## 2017-02-20 DIAGNOSIS — Z794 Long term (current) use of insulin: Secondary | ICD-10-CM | POA: Diagnosis not present

## 2017-02-20 DIAGNOSIS — B9789 Other viral agents as the cause of diseases classified elsewhere: Secondary | ICD-10-CM | POA: Diagnosis present

## 2017-02-20 DIAGNOSIS — F418 Other specified anxiety disorders: Secondary | ICD-10-CM | POA: Diagnosis present

## 2017-02-20 DIAGNOSIS — Z87891 Personal history of nicotine dependence: Secondary | ICD-10-CM

## 2017-02-20 DIAGNOSIS — E11649 Type 2 diabetes mellitus with hypoglycemia without coma: Secondary | ICD-10-CM | POA: Diagnosis present

## 2017-02-20 DIAGNOSIS — J9601 Acute respiratory failure with hypoxia: Secondary | ICD-10-CM | POA: Diagnosis not present

## 2017-02-20 DIAGNOSIS — Z515 Encounter for palliative care: Secondary | ICD-10-CM | POA: Diagnosis not present

## 2017-02-20 DIAGNOSIS — I110 Hypertensive heart disease with heart failure: Secondary | ICD-10-CM | POA: Diagnosis present

## 2017-02-20 DIAGNOSIS — I10 Essential (primary) hypertension: Secondary | ICD-10-CM | POA: Diagnosis present

## 2017-02-20 DIAGNOSIS — R0600 Dyspnea, unspecified: Secondary | ICD-10-CM | POA: Diagnosis not present

## 2017-02-20 DIAGNOSIS — J1289 Other viral pneumonia: Secondary | ICD-10-CM | POA: Diagnosis not present

## 2017-02-20 DIAGNOSIS — J9690 Respiratory failure, unspecified, unspecified whether with hypoxia or hypercapnia: Secondary | ICD-10-CM | POA: Diagnosis not present

## 2017-02-20 DIAGNOSIS — Z7982 Long term (current) use of aspirin: Secondary | ICD-10-CM

## 2017-02-20 DIAGNOSIS — E46 Unspecified protein-calorie malnutrition: Secondary | ICD-10-CM | POA: Diagnosis present

## 2017-02-20 DIAGNOSIS — M7989 Other specified soft tissue disorders: Secondary | ICD-10-CM | POA: Diagnosis not present

## 2017-02-20 DIAGNOSIS — J9620 Acute and chronic respiratory failure, unspecified whether with hypoxia or hypercapnia: Secondary | ICD-10-CM | POA: Diagnosis not present

## 2017-02-20 DIAGNOSIS — Z880 Allergy status to penicillin: Secondary | ICD-10-CM

## 2017-02-20 DIAGNOSIS — R109 Unspecified abdominal pain: Secondary | ICD-10-CM

## 2017-02-20 DIAGNOSIS — Z720 Tobacco use: Secondary | ICD-10-CM | POA: Diagnosis not present

## 2017-02-20 DIAGNOSIS — T380X5A Adverse effect of glucocorticoids and synthetic analogues, initial encounter: Secondary | ICD-10-CM | POA: Diagnosis not present

## 2017-02-20 DIAGNOSIS — R0602 Shortness of breath: Secondary | ICD-10-CM | POA: Diagnosis not present

## 2017-02-20 DIAGNOSIS — R1084 Generalized abdominal pain: Secondary | ICD-10-CM | POA: Diagnosis not present

## 2017-02-20 DIAGNOSIS — R918 Other nonspecific abnormal finding of lung field: Secondary | ICD-10-CM | POA: Diagnosis not present

## 2017-02-20 DIAGNOSIS — I471 Supraventricular tachycardia: Secondary | ICD-10-CM | POA: Diagnosis not present

## 2017-02-20 DIAGNOSIS — J9622 Acute and chronic respiratory failure with hypercapnia: Secondary | ICD-10-CM | POA: Diagnosis not present

## 2017-02-20 DIAGNOSIS — F419 Anxiety disorder, unspecified: Secondary | ICD-10-CM | POA: Diagnosis not present

## 2017-02-20 DIAGNOSIS — K219 Gastro-esophageal reflux disease without esophagitis: Secondary | ICD-10-CM | POA: Diagnosis present

## 2017-02-20 DIAGNOSIS — J449 Chronic obstructive pulmonary disease, unspecified: Secondary | ICD-10-CM | POA: Diagnosis present

## 2017-02-20 DIAGNOSIS — R7989 Other specified abnormal findings of blood chemistry: Secondary | ICD-10-CM | POA: Diagnosis present

## 2017-02-20 DIAGNOSIS — J42 Unspecified chronic bronchitis: Secondary | ICD-10-CM | POA: Diagnosis not present

## 2017-02-20 DIAGNOSIS — F4310 Post-traumatic stress disorder, unspecified: Secondary | ICD-10-CM | POA: Diagnosis present

## 2017-02-20 DIAGNOSIS — G9340 Encephalopathy, unspecified: Secondary | ICD-10-CM | POA: Diagnosis not present

## 2017-02-20 DIAGNOSIS — E119 Type 2 diabetes mellitus without complications: Secondary | ICD-10-CM | POA: Diagnosis present

## 2017-02-20 DIAGNOSIS — R000 Tachycardia, unspecified: Secondary | ICD-10-CM | POA: Diagnosis not present

## 2017-02-20 LAB — URINALYSIS, ROUTINE W REFLEX MICROSCOPIC
BILIRUBIN URINE: NEGATIVE
Glucose, UA: 50 mg/dL — AB
Ketones, ur: 5 mg/dL — AB
LEUKOCYTES UA: NEGATIVE
Nitrite: NEGATIVE
Protein, ur: 30 mg/dL — AB
SPECIFIC GRAVITY, URINE: 1.011 (ref 1.005–1.030)
pH: 6 (ref 5.0–8.0)

## 2017-02-20 LAB — I-STAT CG4 LACTIC ACID, ED
Lactic Acid, Venous: 3.99 mmol/L (ref 0.5–1.9)
Lactic Acid, Venous: 5.6 mmol/L (ref 0.5–1.9)

## 2017-02-20 LAB — BASIC METABOLIC PANEL
Anion gap: 15 (ref 5–15)
BUN: 17 mg/dL (ref 6–20)
CO2: 23 mmol/L (ref 22–32)
CREATININE: 1.02 mg/dL — AB (ref 0.44–1.00)
Calcium: 8.4 mg/dL — ABNORMAL LOW (ref 8.9–10.3)
Chloride: 98 mmol/L — ABNORMAL LOW (ref 101–111)
GFR, EST NON AFRICAN AMERICAN: 57 mL/min — AB (ref >60)
Glucose, Bld: 76 mg/dL (ref 65–99)
Potassium: 4.2 mmol/L (ref 3.5–5.1)
SODIUM: 136 mmol/L (ref 135–145)

## 2017-02-20 LAB — I-STAT ARTERIAL BLOOD GAS, ED
ACID-BASE DEFICIT: 1 mmol/L (ref 0.0–2.0)
Acid-base deficit: 12 mmol/L — ABNORMAL HIGH (ref 0.0–2.0)
BICARBONATE: 29.3 mmol/L — AB (ref 20.0–28.0)
Bicarbonate: 17.8 mmol/L — ABNORMAL LOW (ref 20.0–28.0)
O2 SAT: 93 %
O2 Saturation: 90 %
PCO2 ART: 56.6 mmHg — AB (ref 32.0–48.0)
Patient temperature: 100.1
TCO2: 19 mmol/L (ref 0–100)
TCO2: 31 mmol/L (ref 0–100)
pCO2 arterial: 74.8 mmHg (ref 32.0–48.0)
pH, Arterial: 7.11 — CL (ref 7.350–7.450)
pH, Arterial: 7.202 — ABNORMAL LOW (ref 7.350–7.450)
pO2, Arterial: 82 mmHg — ABNORMAL LOW (ref 83.0–108.0)
pO2, Arterial: 85 mmHg (ref 83.0–108.0)

## 2017-02-20 LAB — POCT I-STAT 3, ART BLOOD GAS (G3+)
ACID-BASE DEFICIT: 7 mmol/L — AB (ref 0.0–2.0)
BICARBONATE: 20.6 mmol/L (ref 20.0–28.0)
O2 Saturation: 98 %
PH ART: 7.246 — AB (ref 7.350–7.450)
TCO2: 22 mmol/L (ref 0–100)
pCO2 arterial: 46.9 mmHg (ref 32.0–48.0)
pO2, Arterial: 130 mmHg — ABNORMAL HIGH (ref 83.0–108.0)

## 2017-02-20 LAB — INFLUENZA PANEL BY PCR (TYPE A & B)
INFLAPCR: NEGATIVE
Influenza B By PCR: NEGATIVE

## 2017-02-20 LAB — CBC WITH DIFFERENTIAL/PLATELET
BASOS ABS: 0 10*3/uL (ref 0.0–0.1)
BASOS PCT: 0 %
EOS ABS: 0 10*3/uL (ref 0.0–0.7)
Eosinophils Relative: 0 %
HCT: 42.8 % (ref 36.0–46.0)
Hemoglobin: 14.5 g/dL (ref 12.0–15.0)
Lymphocytes Relative: 9 %
Lymphs Abs: 3.6 10*3/uL (ref 0.7–4.0)
MCH: 32.8 pg (ref 26.0–34.0)
MCHC: 33.9 g/dL (ref 30.0–36.0)
MCV: 96.8 fL (ref 78.0–100.0)
MONO ABS: 2 10*3/uL — AB (ref 0.1–1.0)
Monocytes Relative: 5 %
NEUTROS ABS: 34.4 10*3/uL — AB (ref 1.7–7.7)
NEUTROS PCT: 86 %
PLATELETS: 641 10*3/uL — AB (ref 150–400)
RBC: 4.42 MIL/uL (ref 3.87–5.11)
RDW: 12.6 % (ref 11.5–15.5)
WBC: 40 10*3/uL — ABNORMAL HIGH (ref 4.0–10.5)

## 2017-02-20 LAB — MRSA PCR SCREENING: MRSA by PCR: POSITIVE — AB

## 2017-02-20 LAB — GLUCOSE, CAPILLARY
GLUCOSE-CAPILLARY: 134 mg/dL — AB (ref 65–99)
GLUCOSE-CAPILLARY: 252 mg/dL — AB (ref 65–99)
Glucose-Capillary: 230 mg/dL — ABNORMAL HIGH (ref 65–99)
Glucose-Capillary: 304 mg/dL — ABNORMAL HIGH (ref 65–99)
Glucose-Capillary: 219 mg/dL — ABNORMAL HIGH (ref 65–99)
Glucose-Capillary: 170 mg/dL — ABNORMAL HIGH (ref 65–99)

## 2017-02-20 LAB — BRAIN NATRIURETIC PEPTIDE: B Natriuretic Peptide: 638.1 pg/mL — ABNORMAL HIGH (ref 0.0–100.0)

## 2017-02-20 LAB — BLOOD GAS, ARTERIAL
ACID-BASE DEFICIT: 5.6 mmol/L — AB (ref 0.0–2.0)
BICARBONATE: 23.6 mmol/L (ref 20.0–28.0)
Delivery systems: POSITIVE
Drawn by: 44166
Expiratory PAP: 5
FIO2: 100
Inspiratory PAP: 10
O2 Saturation: 98.9 %
PATIENT TEMPERATURE: 100.1
PH ART: 7.049 — AB (ref 7.350–7.450)
pCO2 arterial: 91.2 mmHg (ref 32.0–48.0)
pO2, Arterial: 277 mmHg — ABNORMAL HIGH (ref 83.0–108.0)

## 2017-02-20 LAB — I-STAT CHEM 8, ED
BUN: 22 mg/dL — ABNORMAL HIGH (ref 6–20)
Calcium, Ion: 0.89 mmol/L — CL (ref 1.15–1.40)
Chloride: 99 mmol/L — ABNORMAL LOW (ref 101–111)
Creatinine, Ser: 1 mg/dL (ref 0.44–1.00)
Glucose, Bld: 77 mg/dL (ref 65–99)
HEMATOCRIT: 46 % (ref 36.0–46.0)
Hemoglobin: 15.6 g/dL — ABNORMAL HIGH (ref 12.0–15.0)
POTASSIUM: 3.9 mmol/L (ref 3.5–5.1)
SODIUM: 134 mmol/L — AB (ref 135–145)
TCO2: 26 mmol/L (ref 0–100)

## 2017-02-20 LAB — PHOSPHORUS
Phosphorus: 1.6 mg/dL — ABNORMAL LOW (ref 2.5–4.6)
Phosphorus: 2.2 mg/dL — ABNORMAL LOW (ref 2.5–4.6)

## 2017-02-20 LAB — MAGNESIUM
MAGNESIUM: 1.8 mg/dL (ref 1.7–2.4)
Magnesium: 2.1 mg/dL (ref 1.7–2.4)
Magnesium: 1.7 mg/dL (ref 1.7–2.4)

## 2017-02-20 LAB — LACTIC ACID, PLASMA
LACTIC ACID, VENOUS: 3.4 mmol/L — AB (ref 0.5–1.9)
Lactic Acid, Venous: 3.2 mmol/L (ref 0.5–1.9)

## 2017-02-20 LAB — I-STAT TROPONIN, ED: TROPONIN I, POC: 0 ng/mL (ref 0.00–0.08)

## 2017-02-20 LAB — PROCALCITONIN

## 2017-02-20 MED ORDER — CHLORHEXIDINE GLUCONATE 0.12% ORAL RINSE (MEDLINE KIT)
15.0000 mL | Freq: Two times a day (BID) | OROMUCOSAL | Status: DC
  Administered 2017-02-20 – 2017-03-08 (×32): 15 mL via OROMUCOSAL

## 2017-02-20 MED ORDER — SODIUM CHLORIDE 0.9 % IV BOLUS (SEPSIS)
1000.0000 mL | Freq: Once | INTRAVENOUS | Status: AC
  Administered 2017-02-20: 1000 mL via INTRAVENOUS

## 2017-02-20 MED ORDER — ORAL CARE MOUTH RINSE
15.0000 mL | Freq: Four times a day (QID) | OROMUCOSAL | Status: DC
  Administered 2017-02-20 – 2017-03-08 (×60): 15 mL via OROMUCOSAL

## 2017-02-20 MED ORDER — INSULIN REGULAR HUMAN 100 UNIT/ML IJ SOLN: 6.0000 [IU] | Freq: Once | INTRAMUSCULAR | Status: DC

## 2017-02-20 MED ORDER — DIPHENHYDRAMINE HCL 50 MG/ML IJ SOLN
25.0000 mg | Freq: Once | INTRAMUSCULAR | Status: AC
  Administered 2017-02-20: 25 mg via INTRAVENOUS
  Filled 2017-02-20: qty 1

## 2017-02-20 MED ORDER — PRO-STAT SUGAR FREE PO LIQD
30.0000 mL | Freq: Two times a day (BID) | ORAL | Status: DC
  Administered 2017-02-20 – 2017-02-21 (×3): 30 mL
  Filled 2017-02-20 (×4): qty 30

## 2017-02-20 MED ORDER — FENTANYL CITRATE (PF) 100 MCG/2ML IJ SOLN: 50.0000 ug | Freq: Once | INTRAMUSCULAR | Status: DC

## 2017-02-20 MED ORDER — DILTIAZEM LOAD VIA INFUSION
10.0000 mg | Freq: Once | INTRAVENOUS | Status: DC
  Filled 2017-02-20: qty 10

## 2017-02-20 MED ORDER — DEXTROSE 5 % IV SOLN
1.0000 g | INTRAVENOUS | Status: DC
  Administered 2017-02-20 – 2017-02-23 (×4): 1 g via INTRAVENOUS
  Filled 2017-02-20 (×6): qty 1

## 2017-02-20 MED ORDER — FAMOTIDINE 40 MG/5ML PO SUSR
20.0000 mg | Freq: Every day | ORAL | Status: DC
  Administered 2017-02-21: 20 mg
  Filled 2017-02-20: qty 2.5

## 2017-02-20 MED ORDER — PROPOFOL 1000 MG/100ML IV EMUL
INTRAVENOUS | Status: AC
  Filled 2017-02-20: qty 100

## 2017-02-20 MED ORDER — ALBUTEROL (5 MG/ML) CONTINUOUS INHALATION SOLN
15.0000 mg/h | INHALATION_SOLUTION | Freq: Once | RESPIRATORY_TRACT | Status: AC
  Administered 2017-02-20: 15 mg/h via RESPIRATORY_TRACT
  Filled 2017-02-20: qty 20

## 2017-02-20 MED ORDER — VANCOMYCIN HCL IN DEXTROSE 1-5 GM/200ML-% IV SOLN
1000.0000 mg | INTRAVENOUS | Status: DC
  Administered 2017-02-21 – 2017-02-23 (×3): 1000 mg via INTRAVENOUS
  Filled 2017-02-20 (×3): qty 200

## 2017-02-20 MED ORDER — MUPIROCIN 2 % EX OINT
1.0000 "application " | TOPICAL_OINTMENT | Freq: Two times a day (BID) | CUTANEOUS | Status: AC
  Administered 2017-02-20 – 2017-02-24 (×10): 1 via NASAL
  Filled 2017-02-20: qty 22

## 2017-02-20 MED ORDER — IPRATROPIUM BROMIDE 0.02 % IN SOLN
1.0000 mg | Freq: Once | RESPIRATORY_TRACT | Status: AC
  Administered 2017-02-20: 1 mg via RESPIRATORY_TRACT
  Filled 2017-02-20: qty 5

## 2017-02-20 MED ORDER — LEVOFLOXACIN IN D5W 750 MG/150ML IV SOLN
750.0000 mg | Freq: Once | INTRAVENOUS | Status: AC
Start: 2017-02-20 — End: 2017-02-20
  Administered 2017-02-20: 750 mg via INTRAVENOUS
  Filled 2017-02-20: qty 150

## 2017-02-20 MED ORDER — DEXTROSE 5 % IV SOLN
500.0000 mg | INTRAVENOUS | Status: AC
  Administered 2017-02-21 – 2017-02-23 (×3): 500 mg via INTRAVENOUS
  Filled 2017-02-20 (×3): qty 500

## 2017-02-20 MED ORDER — DEXTROSE 5 % IV SOLN
2.0000 g | INTRAVENOUS | Status: DC
  Filled 2017-02-20 (×2): qty 2

## 2017-02-20 MED ORDER — MIDAZOLAM HCL 2 MG/2ML IJ SOLN
2.0000 mg | INTRAMUSCULAR | Status: AC | PRN
  Administered 2017-02-20 – 2017-02-24 (×3): 2 mg via INTRAVENOUS
  Filled 2017-02-20 (×5): qty 2

## 2017-02-20 MED ORDER — INSULIN REGULAR HUMAN 100 UNIT/ML IJ SOLN
6.0000 [IU] | Freq: Once | INTRAMUSCULAR | Status: AC
  Administered 2017-02-20: 6 [IU] via SUBCUTANEOUS
  Filled 2017-02-20: qty 0.06

## 2017-02-20 MED ORDER — FENTANYL CITRATE (PF) 100 MCG/2ML IJ SOLN
100.0000 ug | Freq: Once | INTRAMUSCULAR | Status: AC
  Administered 2017-02-20: 100 ug via INTRAVENOUS

## 2017-02-20 MED ORDER — DILTIAZEM HCL ER COATED BEADS 240 MG PO CP24
240.0000 mg | ORAL_CAPSULE | Freq: Every day | ORAL | Status: DC
  Filled 2017-02-20: qty 1

## 2017-02-20 MED ORDER — SENNOSIDES 8.8 MG/5ML PO SYRP
5.0000 mL | ORAL_SOLUTION | Freq: Two times a day (BID) | ORAL | Status: DC
  Administered 2017-02-20 – 2017-03-07 (×24): 5 mL
  Filled 2017-02-20 (×33): qty 5

## 2017-02-20 MED ORDER — ADENOSINE 6 MG/2ML IV SOLN
INTRAVENOUS | Status: AC
  Filled 2017-02-20: qty 6

## 2017-02-20 MED ORDER — ADENOSINE 6 MG/2ML IV SOLN
12.0000 mg | Freq: Once | INTRAVENOUS | Status: AC
  Administered 2017-02-20: 12 mg via INTRAVENOUS

## 2017-02-20 MED ORDER — CHLORHEXIDINE GLUCONATE CLOTH 2 % EX PADS
6.0000 | MEDICATED_PAD | Freq: Every day | CUTANEOUS | Status: DC
  Administered 2017-02-21 – 2017-02-23 (×3): 6 via TOPICAL

## 2017-02-20 MED ORDER — INSULIN ASPART 100 UNIT/ML ~~LOC~~ SOLN
2.0000 [IU] | SUBCUTANEOUS | Status: DC
  Administered 2017-02-20: 6 [IU] via SUBCUTANEOUS
  Administered 2017-02-20: 2 [IU] via SUBCUTANEOUS
  Administered 2017-02-21: 4 [IU] via SUBCUTANEOUS
  Administered 2017-02-21: 2 [IU] via SUBCUTANEOUS
  Administered 2017-02-21: 4 [IU] via SUBCUTANEOUS
  Administered 2017-02-21: 2 [IU] via SUBCUTANEOUS

## 2017-02-20 MED ORDER — ADENOSINE 6 MG/2ML IV SOLN
6.0000 mg | Freq: Once | INTRAVENOUS | Status: AC
  Administered 2017-02-20: 6 mg via INTRAVENOUS

## 2017-02-20 MED ORDER — METHYLPREDNISOLONE SODIUM SUCC 125 MG IJ SOLR
80.0000 mg | Freq: Every day | INTRAMUSCULAR | Status: DC
  Administered 2017-02-20 – 2017-02-26 (×7): 80 mg via INTRAVENOUS
  Filled 2017-02-20 (×4): qty 1.28
  Filled 2017-02-20: qty 2
  Filled 2017-02-20 (×3): qty 1.28

## 2017-02-20 MED ORDER — SODIUM CHLORIDE 0.9 % IV SOLN: 250.0000 mL | INTRAVENOUS | Status: DC | PRN

## 2017-02-20 MED ORDER — FENTANYL 2500MCG IN NS 250ML (10MCG/ML) PREMIX INFUSION
25.0000 ug/h | INTRAVENOUS | Status: DC
  Administered 2017-02-20: 100 ug/h via INTRAVENOUS
  Administered 2017-02-21: 250 ug/h via INTRAVENOUS
  Filled 2017-02-20 (×2): qty 250

## 2017-02-20 MED ORDER — DILTIAZEM HCL-DEXTROSE 100-5 MG/100ML-% IV SOLN (PREMIX)
5.0000 mg/h | INTRAVENOUS | Status: DC
  Administered 2017-02-20: 10 mg/h via INTRAVENOUS
  Administered 2017-02-20: 5 mg/h via INTRAVENOUS
  Administered 2017-02-20 – 2017-02-21 (×2): 10 mg/h via INTRAVENOUS
  Filled 2017-02-20 (×5): qty 100

## 2017-02-20 MED ORDER — FENTANYL BOLUS VIA INFUSION
50.0000 ug | INTRAVENOUS | Status: DC | PRN
  Filled 2017-02-20: qty 50

## 2017-02-20 MED ORDER — ACETAMINOPHEN 650 MG RE SUPP
650.0000 mg | Freq: Once | RECTAL | Status: AC
  Administered 2017-02-20: 650 mg via RECTAL

## 2017-02-20 MED ORDER — FAMOTIDINE 40 MG/5ML PO SUSR
20.0000 mg | Freq: Two times a day (BID) | ORAL | Status: DC
  Administered 2017-02-20: 20 mg
  Filled 2017-02-20: qty 2.5

## 2017-02-20 MED ORDER — PHENYLEPHRINE HCL 10 MG/ML IJ SOLN
0.0000 ug/min | INTRAMUSCULAR | Status: DC
  Administered 2017-02-20: 20 ug/min via INTRAVENOUS
  Administered 2017-02-20: 30 ug/min via INTRAVENOUS
  Administered 2017-02-21: 20 ug/min via INTRAVENOUS
  Administered 2017-02-21 (×2): 30 ug/min via INTRAVENOUS
  Administered 2017-02-21: 50 ug/min via INTRAVENOUS
  Filled 2017-02-20 (×7): qty 1

## 2017-02-20 MED ORDER — LEVALBUTEROL HCL 0.63 MG/3ML IN NEBU: 0.6300 mg | INHALATION_SOLUTION | RESPIRATORY_TRACT | Status: DC | PRN

## 2017-02-20 MED ORDER — SODIUM CHLORIDE 0.9 % IV SOLN
1000.0000 mL | INTRAVENOUS | Status: DC
  Administered 2017-02-20 – 2017-02-22 (×4): 1000 mL via INTRAVENOUS

## 2017-02-20 MED ORDER — SODIUM CHLORIDE 0.9 % IV BOLUS (SEPSIS)
500.0000 mL | Freq: Once | INTRAVENOUS | Status: AC
  Administered 2017-02-20: 500 mL via INTRAVENOUS

## 2017-02-20 MED ORDER — IPRATROPIUM-ALBUTEROL 0.5-2.5 (3) MG/3ML IN SOLN
3.0000 mL | RESPIRATORY_TRACT | Status: DC
  Administered 2017-02-20 – 2017-02-26 (×37): 3 mL via RESPIRATORY_TRACT
  Filled 2017-02-20 (×35): qty 3

## 2017-02-20 MED ORDER — DEXMEDETOMIDINE HCL 200 MCG/2ML IV SOLN
0.4000 ug/kg/h | INTRAVENOUS | Status: DC
  Administered 2017-02-20: 0.4 ug/kg/h via INTRAVENOUS
  Administered 2017-02-20: 0.8 ug/kg/h via INTRAVENOUS
  Filled 2017-02-20 (×2): qty 2

## 2017-02-20 MED ORDER — SODIUM CHLORIDE 0.9 % IV BOLUS (SEPSIS): 1000.0000 mL | Freq: Once | INTRAVENOUS | Status: DC

## 2017-02-20 MED ORDER — VANCOMYCIN HCL IN DEXTROSE 1-5 GM/200ML-% IV SOLN
1000.0000 mg | Freq: Once | INTRAVENOUS | Status: AC
  Administered 2017-02-20: 1000 mg via INTRAVENOUS
  Filled 2017-02-20: qty 200

## 2017-02-20 MED ORDER — ENOXAPARIN SODIUM 30 MG/0.3ML ~~LOC~~ SOLN
20.0000 mg | SUBCUTANEOUS | Status: DC
  Administered 2017-02-20: 20 mg via SUBCUTANEOUS
  Filled 2017-02-20 (×2): qty 0.2

## 2017-02-20 MED ORDER — MIDAZOLAM HCL 2 MG/2ML IJ SOLN
2.0000 mg | INTRAMUSCULAR | Status: DC | PRN
  Administered 2017-02-21 – 2017-02-26 (×21): 2 mg via INTRAVENOUS
  Filled 2017-02-20 (×20): qty 2

## 2017-02-20 MED ORDER — ETOMIDATE 2 MG/ML IV SOLN
INTRAVENOUS | Status: DC | PRN
  Administered 2017-02-20: 13 mg via INTRAVENOUS

## 2017-02-20 MED ORDER — PROPOFOL 1000 MG/100ML IV EMUL
5.0000 ug/kg/min | INTRAVENOUS | Status: DC
  Administered 2017-02-20: 10 ug/kg/min via INTRAVENOUS

## 2017-02-20 MED ORDER — SUCCINYLCHOLINE CHLORIDE 20 MG/ML IJ SOLN
INTRAMUSCULAR | Status: DC | PRN
  Administered 2017-02-20: 65 mg via INTRAVENOUS

## 2017-02-20 MED ORDER — VITAL HIGH PROTEIN PO LIQD
1000.0000 mL | ORAL | Status: DC
  Administered 2017-02-20 – 2017-02-21 (×2): 1000 mL

## 2017-02-20 MED ORDER — FENTANYL CITRATE (PF) 100 MCG/2ML IJ SOLN
INTRAMUSCULAR | Status: AC
  Filled 2017-02-20: qty 2

## 2017-02-20 MED ORDER — POTASSIUM PHOSPHATES 15 MMOLE/5ML IV SOLN
30.0000 mmol | Freq: Once | INTRAVENOUS | Status: DC
  Administered 2017-02-20: 30 mmol via INTRAVENOUS
  Filled 2017-02-20: qty 10

## 2017-02-20 NOTE — H&P (Signed)
PULMONARY / CRITICAL CARE MEDICINE   Name: Megan [name] MRN: [id number] DOB: [birth year]    ADMISSION DATE:  02/20/2017  CHIEF COMPLAINT:  Dyspnea  HISTORY OF PRESENT ILLNESS:   [name] is a 66 y/o woman with severe COPD who presented with worsening dyspnea over several days with sputum changes. She has a history of GOLD IV-D COPD on home O2 including a hospitalization in April '17 with a few days of mechanical ventilation. She was treated with BiPAP in the ED, but failed this, and required intubation. PCCM was called to admit.  PAST MEDICAL HISTORY :  She  has a past medical history of Anemia; Anxiety state (08/20/2015); CAP (community acquired pneumonia); CHF (congestive heart failure) ([place]); COPD (chronic obstructive pulmonary disease) ([place]); Depression; Essential hypertension (08/19/2015); GERD (gastroesophageal reflux disease); Headache; History of hiatal hernia; Myocardial infarction; and Shortness of breath dyspnea.  PAST SURGICAL HISTORY: She  has a past surgical history that includes Cardiac catheterization and Cardiac catheterization (N/A, 08/26/2015).  Allergies  Allergen Reactions  . Penicillins Rash    Has patient had a PCN reaction causing immediate rash, facial/tongue/throat swelling, SOB or lightheadedness with hypotension: No Has patient had a PCN reaction causing severe rash involving mucus membranes or skin necrosis:NO Has patient had a PCN reaction that required hospitalization No Has patient had a PCN reaction occurring within the last 10 years:NO If all of the above answers are "NO", then may proceed with Cephalosporin use.    No current facility-administered medications on file prior to encounter.    Current Outpatient Prescriptions on File Prior to Encounter  Medication Sig  . acetaminophen (TYLENOL) 500 MG tablet Take 500 mg by mouth every 6 (six) hours as needed for mild pain.  Marland Kitchen ALPRAZolam (XANAX) 0.25 MG tablet Take 1 tablet (0.25 mg total) by mouth 2  (two) times daily as needed for anxiety.  Marland Kitchen aspirin EC 81 MG tablet Take 81 mg by mouth daily.  . butalbital-acetaminophen-caffeine (FIORICET, ESGIC) 50-325-40 MG tablet Take 1 tablet by mouth 2 (two) times daily as needed for headache.  . Diclofenac Sodium (PENNSAID) 2 % SOLN Place 1 application onto the skin 2 (two) times daily as needed. Apply to right shoulder (Patient taking differently: Place 1 application onto the skin 2 (two) times daily as needed (pain). Apply to right shoulder)  . diltiazem (CARDIZEM CD) 240 MG 24 hr capsule Take 1 capsule (240 mg total) by mouth daily.  . feeding supplement, ENSURE ENLIVE, (ENSURE ENLIVE) LIQD Take 237 mLs by mouth 4 (four) times daily.  . fluticasone (FLONASE) 50 MCG/ACT nasal spray Place 2 sprays into both nostrils daily.  . Fluticasone-Salmeterol (ADVAIR) 500-50 MCG/DOSE AEPB Inhale 1 puff into the lungs 2 (two) times daily.  . INCRUSE ELLIPTA 62.5 MCG/INH AEPB INHALE 1 PUFF INTO THE LUNGS DAILY  . ipratropium (ATROVENT) 0.02 % nebulizer solution Take 2.5 mLs (0.5 mg total) by nebulization every 4 (four) hours as needed for wheezing or shortness of breath.  . levalbuterol (XOPENEX HFA) 45 MCG/ACT inhaler Inhale 1-2 puffs into the lungs every 4 (four) hours as needed for wheezing.  . levalbuterol (XOPENEX) 0.63 MG/3ML nebulizer solution Take 3 mLs (0.63 mg total) by nebulization every 4 (four) hours as needed for wheezing. (Patient not taking: Reported on 01/24/2017)  . lidocaine (LIDODERM) 5 % Place 0.25 patches onto the skin daily as needed (pain). Remove & Discard patch within 12 hours or as directed by MD  . loratadine (CLARITIN) 10 MG tablet Take 1 tablet (  10 mg total) by mouth daily.  Marland Kitchen MELATONIN PO Take 1 tablet by mouth at bedtime as needed (sleep).  . pantoprazole (PROTONIX) 40 MG tablet Take 1 tablet (40 mg total) by mouth daily at 6 (six) AM.  . Polyethyl Glycol-Propyl Glycol (SYSTANE OP) Place 1 drop into both eyes every 6 (six) hours as  needed (dry eyes). Reported on 05/19/2016  . polyethylene glycol (MIRALAX / GLYCOLAX) packet Take 17 g by mouth 2 (two) times daily.  . predniSONE (DELTASONE) 10 MG tablet Take 3 tablets for 2 days, then take 2 tablets for 2 days, then take 1 tablet for 2 days.  . predniSONE (DELTASONE) 20 MG tablet Take 1-3 tablets (20-60 mg total) by mouth daily with breakfast. Take 3 tablets ('60mg'$ )daily x 3 days, then 2 tablets ('40mg'$ ) daily x 3 days, then 1 tablet('20mg'$ )daily x 3 days then stop. (Patient not taking: Reported on 01/24/2017)  . promethazine (PHENERGAN) 12.5 MG tablet Take 12.5 mg by mouth every 6 (six) hours as needed for nausea or vomiting. Reported on 05/19/2016  . senna-docusate (SENOKOT-S) 8.6-50 MG tablet Take 1 tablet by mouth 2 (two) times daily.    FAMILY HISTORY:  Her indicated that her mother is deceased. She indicated that her father is deceased. She indicated that her maternal grandmother is deceased. She indicated that her maternal grandfather is deceased. She indicated that her paternal grandmother is deceased. She indicated that her paternal grandfather is deceased.    SOCIAL HISTORY: She  reports that she quit smoking about 16 years ago. Her smoking use included Cigarettes. She has a 30.00 pack-year smoking history. She has never used smokeless tobacco. She reports that she does not drink alcohol or use drugs.  REVIEW OF SYSTEMS:   Unable to obtain due to intubation  VITAL SIGNS: BP (!) 82/69   Pulse 96   Temp 98.4 F (36.9 C) (Oral)   Resp (!) 25   Ht '5\' 3"'$  (1.6 m)   Wt 101 lb 3.1 oz (45.9 kg)   SpO2 96%   BMI 17.93 kg/m   HEMODYNAMICS:    VENTILATOR SETTINGS: Vent Mode: PRVC FiO2 (%):  [40 %-100 %] 60 % Set Rate:  [20 bmp-26 bmp] 26 bmp Vt Set:  [420 mL] 420 mL PEEP:  [5 cmH20] 5 cmH20 Plateau Pressure:  [16 cmH20-26 cmH20] 26 cmH20  INTAKE / OUTPUT: No intake/output data recorded.  PHYSICAL EXAMINATION: General: Thin, elderly woman appearing older than  stated age Neuro:  awake, alert, despite intubated state. Able to readily communicate with hand signals. HEENT:  ETT in place, some purulent sputum in vent circuit. Cardiovascular:  Normal S1/S2 Lungs:  Diminished breath sounds, but with wheezing. Abdomen:  Soft Musculoskeletal:  No deformed joints Skin:  No rashes on visible skin  LABS:  BMET  Recent Labs Lab 02/20/17 0109 02/20/17 0139  NA 136 134*  K 4.2 3.9  CL 98* 99*  CO2 23  --   BUN 17 22*  CREATININE 1.02* 1.00  GLUCOSE 76 77    Electrolytes  Recent Labs Lab 02/20/17 0109  CALCIUM 8.4*    CBC  Recent Labs Lab 02/20/17 0109 02/20/17 0139  WBC 40.0*  --   HGB 14.5 15.6*  HCT 42.8 46.0  PLT 641*  --     Coag's No results for input(s): APTT, INR in the last 168 hours.  Sepsis Markers  Recent Labs Lab 02/20/17 0139 02/20/17 0406  LATICACIDVEN 3.99* 5.60*    ABG  Recent Labs Lab  02/20/17 0033 02/20/17 0220 02/20/17 0450  PHART 7.202* 7.049* 7.110*  PCO2ART 74.8* 91.2* 56.6*  PO2ART 85.0 277* 82.0*    Liver Enzymes No results for input(s): AST, ALT, ALKPHOS, BILITOT, ALBUMIN in the last 168 hours.  Cardiac Enzymes No results for input(s): TROPONINI, PROBNP in the last 168 hours.  Glucose  Recent Labs Lab 02/20/17 0551  GLUCAP 230*    Imaging Dg Chest Port 1 View  Result Date: 02/20/2017 CLINICAL DATA:  Initial evaluation for intubation, NG tube placement. EXAM: PORTABLE CHEST 1 VIEW COMPARISON:  Prior radiograph from earlier same day. FINDINGS: Endotracheal tube in place with tip positioned approximately 2.9 cm above the carina. NG tube courses in the the abdomen, tip in side hole beyond the GE junction. Defibrillator pads overlie the left chest. Emphysema with patchy bibasilar opacities again noted, right greater than left, not significantly changed. IMPRESSION: 1. Endotracheal tube approximately 2.9 cm above the carina. Enteric tube overlying the stomach. 2. No significant  interval change in patchy right greater than left bibasilar opacities. 3. Emphysema. Electronically Signed   By: Jeannine Boga M.D.   On: 02/20/2017 03:23   Dg Chest Portable 1 View  Result Date: 02/20/2017 CLINICAL DATA:  Initial evaluation for acute shortness of breath. EXAM: PORTABLE CHEST 1 VIEW COMPARISON:  Prior radiograph from 01/24/2017. FINDINGS: Cardiac and mediastinal silhouettes are stable in size and contour, and remain within normal limits. Aortic atherosclerosis. Lungs are hyperinflated with severe emphysematous changes. Increased patchy bibasilar opacities, right greater than left, somewhat concerning for infiltrates. No pulmonary edema. Chronic blunting of the costophrenic angles is similar to prior without obvious pleural effusion. No pneumothorax. Nodular density overlying the superior aspect of the right upper lobe noted, most likely a nipple shadow. No acute osseus abnormality. IMPRESSION: 1. Patchy bibasilar opacities, right worse than left, somewhat concerning for infectious infiltrates. 2. Severe underlying emphysema. Electronically Signed   By: Jeannine Boga M.D.   On: 02/20/2017 00:34     STUDIES:  None  CULTURES: Sputum (needs to be collected)  ANTIBIOTICS: CTX >> Azithro >>  SIGNIFICANT EVENTS: Intubated in ED  LINES/TUBES: ETT PIV  DISCUSSION: 66 y/o woman with severe COPD, intubated for hypercarbia  ASSESSMENT / PLAN:  PULMONARY A: GOLD IV-D COPD with current exacerbation Need for mechanical ventilation Hypercarbic Respiratory failure P:   Full vent support Solumedrol, 60 mg daily Ceftriaxone and azithromycin  CARDIOVASCULAR A:  CHF Chronic Tachycardia P:  Dilt infusion, on cardizem at home, started last month.  RENAL A:   Severe acidosis, mixed respiratory and metabolic. Lactic acidosis P:   Supportive care, monitor labs  GASTROINTESTINAL A:   No active issues P:    HEMATOLOGIC A:   No active issues P:    INFECTIOUS A:   Exacerbation of chronic bronchitis P:   CTX / azithro  ENDOCRINE A:   No active issues P:    NEUROLOGIC A:   Need for sedation P:   Precedex as patient is tolerating vent well, will need to avoid medications which could retard respiratory drive  RASS goal: 0   CRITICAL CARE Performed by: Luz Brazen   Total critical care time: 50 minutes  Critical care time was exclusive of separately billable procedures and treating other patients.  Critical care was necessary to treat or prevent imminent or life-threatening deterioration.  Critical care was time spent personally by me on the following activities: development of treatment plan with patient and/or surrogate as well as nursing, discussions with consultants, evaluation  of patient's response to treatment, examination of patient, obtaining history from patient or surrogate, ordering and performing treatments and interventions, ordering and review of laboratory studies, ordering and review of radiographic studies, pulse oximetry and re-evaluation of patient's condition.    FAMILY  - Updates:   - Inter-disciplinary family meet or Palliative Care meeting due by:  02/27/17   Luz Brazen, MD Pulmonary and [place] Pager: [phone]  02/20/2017, 6:52 AM

## 2017-02-20 NOTE — ED Notes (Signed)
Informed MD Ancil Linsey of consistently low SBP readings between 75 and 80. Per MD Ancil Linsey, maintain MAP > 65. Alert MD if this parameter not met.

## 2017-02-20 NOTE — Progress Notes (Signed)
[place] [facility] Progress Note Patient Name: Myliah Medel DOB: 06/07/1951 MRN: [id number]   Date of Service  02/20/2017  HPI/Events of Note  Multiple issues: 1. Hypotension - BP = 62/52. No CVL. Has received 4 L of crystalloid already and 2. Request for Foley catheter.   eICU Interventions  Will order: 1. Phenylephrine IV infusion. Titrate for MAP >= 65.  2. Place Foley catheter.      Intervention Category Major Interventions: Hypotension - evaluation and management  Duvid Smalls [place] 02/20/2017, 6:05 AM

## 2017-02-20 NOTE — Progress Notes (Signed)
Pharmacy Antibiotic Note  Megan [name] is a 66 y.o. female admitted on 02/20/2017 with pneumonia.  Pharmacy has been consulted for vancomycin and cefepime dosing. Tmax is 100.1 and WBC is significantly elevated at 40. Procalcitonin is also significantly elevated >150. SCr is WNL and lactic acid is increasing.   Plan: Vanc 1gm IV Q24H Cefepime 1gm IV Q24H F/u renal fxn, C&S, clinical status and trough at SS  Height: '5\' 3"'$  (160 cm) Weight: 101 lb 3.1 oz (45.9 kg) IBW/kg (Calculated) : 52.4  Temp (24hrs), Avg:98.8 F (37.1 C), Min:97.4 F (36.3 C), Max:100.1 F (37.8 C)   Recent Labs Lab 02/20/17 0109 02/20/17 0139 02/20/17 0406  WBC 40.0*  --   --   CREATININE 1.02* 1.00  --   LATICACIDVEN  --  3.99* 5.60*    Estimated Creatinine Clearance: 41.2 mL/min (by C-G formula based on SCr of 1 mg/dL).    Allergies  Allergen Reactions  . Penicillins Rash    Has patient had a PCN reaction causing immediate rash, facial/tongue/throat swelling, SOB or lightheadedness with hypotension: No Has patient had a PCN reaction causing severe rash involving mucus membranes or skin necrosis:NO Has patient had a PCN reaction that required hospitalization No Has patient had a PCN reaction occurring within the last 10 years:NO If all of the above answers are "NO", then may proceed with Cephalosporin use.    Antimicrobials this admission: Vanc 4/22>> Cefepime 4/22>> Azithro 4/23>> Levaquin x1 4/22  Dose adjustments this admission: N/A  Microbiology results: 4/22 MRSA - POS 4/22 Blood - NGTD  Thank you for allowing pharmacy to be a part of this patient's care.  Deatra Mcmahen, Rande Lawman 02/20/2017 11:00 AM

## 2017-02-20 NOTE — Consult Note (Signed)
Name: Megan [name] MRN: [id number] DOB: 01-Mar-1951    LOS: 0  PCCM ADMISSION NOTE  History of Present Illness: [name] is a 66 year old woman with COPD on home oxygen, hypertension hospitalized for acute on chronic respiratory failure. As she is intubated, history was collected from the chart.  She presented via EMS with shortness of breath and appeared cyanotic in the ED. She received Duonebs x 2, Solumedrol 125 mg IV, Mg 2g by EMS. She acknowledged chest pain, productive cough as well as full code by nodding yes. In the ED, She received adenosine 6 mg and 12 mg IV given concern for SVT though did not return to normal sinus rhythm so was started on diltiazem gtt. She did not improve on BiPAP and was intubated.  Lines / Drains: 4/22 PIV x 2  Cultures: 4/22 >>  Antibiotics: 4/22 Azithro 4/22 Ceftriaxone  Tests / Events: 4/22 Intubated   The patient is sedated, intubated and unable to provide history, which was obtained for available medical records.    Past Medical History:  Diagnosis Date  . Anemia   . Anxiety state 08/20/2015  . CAP (community acquired pneumonia)   . CHF (congestive heart failure) ([place])   . COPD (chronic obstructive pulmonary disease) ([place])   . Depression   . Essential hypertension 08/19/2015  . GERD (gastroesophageal reflux disease)   . Headache   . History of hiatal hernia   . Myocardial infarction   . Shortness of breath dyspnea    Past Surgical History:  Procedure Laterality Date  . CARDIAC CATHETERIZATION    . CARDIAC CATHETERIZATION N/A 08/26/2015   Procedure: Left Heart Cath and Coronary Angiography;  Surgeon: Jettie Booze, MD;  Location: [facility];  Service: Cardiovascular;  Laterality: N/A;   Prior to Admission medications   Medication Sig Start Date End Date Taking? Authorizing Provider  acetaminophen (TYLENOL) 500 MG tablet Take 500 mg by mouth every 6 (six) hours as needed for mild pain.    Historical Provider, MD   ALPRAZolam Duanne Moron) 0.25 MG tablet Take 1 tablet (0.25 mg total) by mouth 2 (two) times daily as needed for anxiety. 12/22/16   [place], FNP  aspirin EC 81 MG tablet Take 81 mg by mouth daily.    Historical Provider, MD  butalbital-acetaminophen-caffeine (FIORICET, ESGIC) [phone] MG tablet Take 1 tablet by mouth 2 (two) times daily as needed for headache. 01/11/17   Eugenie Filler, MD  Diclofenac Sodium (PENNSAID) 2 % SOLN Place 1 application onto the skin 2 (two) times daily as needed. Apply to right shoulder Patient taking differently: Place 1 application onto the skin 2 (two) times daily as needed (pain). Apply to right shoulder 09/17/16   Flossie Buffy, NP  diltiazem (CARDIZEM CD) 240 MG 24 hr capsule Take 1 capsule (240 mg total) by mouth daily. 01/12/17   Eugenie Filler, MD  feeding supplement, ENSURE ENLIVE, (ENSURE ENLIVE) LIQD Take 237 mLs by mouth 4 (four) times daily. 01/11/17   Eugenie Filler, MD  fluticasone (FLONASE) 50 MCG/ACT nasal spray Place 2 sprays into both nostrils daily. 01/12/17   Eugenie Filler, MD  Fluticasone-Salmeterol (ADVAIR) 500-50 MCG/DOSE AEPB Inhale 1 puff into the lungs 2 (two) times daily. 04/19/16   Collene Gobble, MD  INCRUSE ELLIPTA 62.5 MCG/INH AEPB INHALE 1 PUFF INTO THE LUNGS DAILY 11/29/16   Magdalen Spatz, NP  ipratropium (ATROVENT) 0.02 % nebulizer solution Take 2.5 mLs (0.5 mg total) by nebulization  every 4 (four) hours as needed for wheezing or shortness of breath. 01/11/17   Eugenie Filler, MD  levalbuterol [facility] HFA) 45 MCG/ACT inhaler Inhale 1-2 puffs into the lungs every 4 (four) hours as needed for wheezing. 01/11/17   Eugenie Filler, MD  levalbuterol Penne Lash) 0.63 MG/3ML nebulizer solution Take 3 mLs (0.63 mg total) by nebulization every 4 (four) hours as needed for wheezing. Patient not taking: Reported on 01/24/2017 01/11/17   Eugenie Filler, MD  lidocaine (LIDODERM) 5 % Place 0.25 patches onto the skin daily as needed  (pain). Remove & Discard patch within 12 hours or as directed by MD    Historical Provider, MD  loratadine (CLARITIN) 10 MG tablet Take 1 tablet (10 mg total) by mouth daily. 01/12/17   Eugenie Filler, MD  MELATONIN PO Take 1 tablet by mouth at bedtime as needed (sleep).    Historical Provider, MD  pantoprazole (PROTONIX) 40 MG tablet Take 1 tablet (40 mg total) by mouth daily at 6 (six) AM. 01/11/17   Eugenie Filler, MD  Polyethyl Glycol-Propyl Glycol (SYSTANE OP) Place 1 drop into both eyes every 6 (six) hours as needed (dry eyes). Reported on 05/19/2016    Historical Provider, MD  polyethylene glycol (MIRALAX / GLYCOLAX) packet Take 17 g by mouth 2 (two) times daily. 01/11/17   Eugenie Filler, MD  predniSONE (DELTASONE) 10 MG tablet Take 3 tablets for 2 days, then take 2 tablets for 2 days, then take 1 tablet for 2 days. 01/19/17   Magdalen Spatz, NP  predniSONE (DELTASONE) 20 MG tablet Take 1-3 tablets (20-60 mg total) by mouth daily with breakfast. Take 3 tablets ('60mg'$ )daily x 3 days, then 2 tablets ('40mg'$ ) daily x 3 days, then 1 tablet('20mg'$ )daily x 3 days then stop. Patient not taking: Reported on 01/24/2017 01/12/17   Eugenie Filler, MD  promethazine (PHENERGAN) 12.5 MG tablet Take 12.5 mg by mouth every 6 (six) hours as needed for nausea or vomiting. Reported on 05/19/2016    Historical Provider, MD  senna-docusate (SENOKOT-S) 8.6-50 MG tablet Take 1 tablet by mouth 2 (two) times daily. 01/11/17   Eugenie Filler, MD   Allergies Allergies  Allergen Reactions  . Penicillins Rash    Has patient had a PCN reaction causing immediate rash, facial/tongue/throat swelling, SOB or lightheadedness with hypotension: No Has patient had a PCN reaction causing severe rash involving mucus membranes or skin necrosis:NO Has patient had a PCN reaction that required hospitalization No Has patient had a PCN reaction occurring within the last 10 years:NO If all of the above answers are "NO", then may  proceed with Cephalosporin use.    Family History Family History  Problem Relation Age of Onset  . Cirrhosis Mother   . Depression Mother   . Heart disease Father     Social History  reports that she quit smoking about 16 years ago. Her smoking use included Cigarettes. She has a 30.00 pack-year smoking history. She has never used smokeless tobacco. She reports that she does not drink alcohol or use drugs.  Review Of Systems  11 points review of systems is negative with an exception of listed in HPI.  Vital Signs: Temp:  [98.4 F (36.9 C)-100.1 F (37.8 C)] 98.4 F (36.9 C) (04/22 0553) Pulse Rate:  [96-160] 96 (04/22 0510) Resp:  [7-27] 25 (04/22 0510) BP: (65-140)/(47-105) 82/69 (04/22 0510) SpO2:  [96 %-100 %] 96 % (04/22 0511) FiO2 (%):  [40 %-100 %]  60 % (04/22 0537) Weight:  [94 lb 9.2 oz (42.9 kg)-101 lb 3.1 oz (45.9 kg)] 101 lb 3.1 oz (45.9 kg) (04/22 0500) No intake/output data recorded.  Physical Examination: Physical Exam  Constitutional: No distress.  HENT:  Head: Normocephalic and atraumatic.  Intubated  Cardiovascular: Regular rhythm and intact distal pulses.   Tachycardic  Pulmonary/Chest: Effort normal. No respiratory distress.  Abdominal: Soft. She exhibits no distension.  Neurological:  Sedated  Skin: She is not diaphoretic.     Ventilator settings: Vent Mode: PRVC FiO2 (%):  [40 %-100 %] 60 % Set Rate:  [20 bmp-26 bmp] 26 bmp Vt Set:  [420 mL] 420 mL PEEP:  [5 cmH20] 5 cmH20 Plateau Pressure:  [16 cmH20-26 cmH20] 26 cmH20  Labs and Imaging:  Reviewed.  Please refer to the Assessment and Plan section for relevant results.  Assessment and Plan:  PULMONARY  ASSESSMENT: Acute on chronic hypercapnic respiratory failure: Multifactorial in the setting of COPD and possibly PNA. CHF listed on her problem list though EF 55-60% in October 2016 with non-ischemic disease on cardiac cath at that time.  PLAN:   Continue mechanical  ventilation Continue bronchodilators and steroids   CARDIOVASCULAR  ASSESSMENT:  Sinus tachycardia  PLAN:  Titrate diltiazem gtt as tolerated   RENAL  ASSESSMENT:   Acute kidney injury: Crt 1.0, up from 0.6 at baseline. Suspect pre-renal in the setting of sepsis. Received 3.7L  Anion gap metabolic acidosis: Suspect lactic acidosis.  PLAN:   Wean phenylephrine as tolerate Follow BMET Recheck lactate   GASTROINTESTINAL  ASSESSMENT:   No active issues  PLAN:   Continue assessing   HEMATOLOGIC  ASSESSMENT:   Leukocytosis: 40,000 on admission, predominantly neutrophilic, reflective of infection though suspect some degree of concentration.  PLAN:  Trend CBC   INFECTIOUS  ASSESSMENT:   Sepsis: Symptoms suggest PNA though no infiltrate on CXR.   PLAN:   -Follow PCT -Consider empiric influenza treatment -Continue ceftriaxone and azithromycin for CAP coverage   ENDOCRINE  ASSESSMENT:   Hyperglycemia: Steroids put her at risk.    PLAN:   ICU glycemic protocol   NEUROLOGIC  ASSESSMENT:   Anxiety/depression: Xanax listed on home medication list and last filled 12/22/16 [0.25 mg x 60 tablets].  PLAN:   Precedex for sedation  Best practices / Disposition: -->ICU status under PCCM -->full code -->Heparin for DVT Px -->Protonix for GI Px -->ventilator bundle -->diet -->family updated at bedside  Charlott Rakes, PGY3 Internal Medicine Pager: [phone]  STAFF NOTE: Linwood Dibbles, MD FACP have personally reviewed patient's available data, including medical history, events of note, physical examination and test results as part of my evaluation. I have discussed with resident/NP and other care providers such as pharmacist, RN and RRT. In addition, I personally evaluated patient and elicited key findings of: ETT, not following commands, copd with presumed PNA, likely will blossom infiltrates furtehr rt base, PCT is impressive, last abg assessed ,  need repeat and likely increased mV to correct PH, would avoid rate 24-26 greater with copd and risk autopeep, remains on low dose neo, lactic was rising but this was hours ago, need repeat lactic, abg now, low threshold ct abdo/chest, was in hospital a month ago, need nosocomial exposure pseudo / mrsa and atypical as from home, esnure 30 cc/kg was given, I have done ( just arrived to see her now) repeat assessment, bolus again lactic noted, feed today The patient is critically ill with multiple organ systems failure and requires high  complexity decision making for assessment and support, frequent evaluation and titration of therapies, application of advanced monitoring technologies and extensive interpretation of multiple databases.   Critical Care Time devoted to patient care services described in this note is 35 Minutes. This time reflects time of care of this signee: Merrie Roof, MD FACP. This critical care time does not reflect procedure time, or teaching time or supervisory time of PA/NP/Med student/Med Resident etc but could involve care discussion time. Rest per NP/medical resident whose note is outlined above and that I agree with   Lavon Paganini. Titus Mould, MD, [place] Pgr: [facility] 02/20/2017 10:30 AM

## 2017-02-20 NOTE — ED Notes (Signed)
This RN unable to obtain blood samples. Phlebotomy called for lab draw.

## 2017-02-20 NOTE — Progress Notes (Signed)
ABG collected  

## 2017-02-20 NOTE — ED Notes (Signed)
Per Dr. Ancil Linsey, maintain Diltiazem drip at current rate of '10mg'$ /hr.

## 2017-02-20 NOTE — ED Provider Notes (Addendum)
TIME SEEN: 12:21 AM  CHIEF COMPLAINT: Shortness of breath, respiratory distress  HPI: Patient is a 66 year old female with history of CHF (last echocardiogram however showed EF of 55-60%), COPD, hypertension who is on 2 L of oxygen a who presents emergency department via EMS with shortness of breath. History is very limited as patient is in respiratory distress. Per EMS, sats were 88% on their arrival the patient appears cyanotic here in the emergency department. She received 2 duo nebs with EMS, 125 mg of IV Solu-Medrol, 2 g of IV magnesium. She is able to nod yes or no but unable to answer questions. She nods yes when I ask her if she is having chest pain, productive cough. She shakes her head no when I ask her she is having fever.  Patient nods her head yes that she is a full code.   ROS: Level V caveat for respiratory distress  PAST MEDICAL HISTORY/PAST SURGICAL HISTORY:  Past Medical History:  Diagnosis Date  . Anemia   . Anxiety state 08/20/2015  . CAP (community acquired pneumonia)   . CHF (congestive heart failure) ([place])   . COPD (chronic obstructive pulmonary disease) ([place])   . Depression   . Essential hypertension 08/19/2015  . GERD (gastroesophageal reflux disease)   . Headache   . History of hiatal hernia   . Myocardial infarction   . Shortness of breath dyspnea     MEDICATIONS:  Prior to Admission medications   Medication Sig Start Date End Date Taking? Authorizing Provider  acetaminophen (TYLENOL) 500 MG tablet Take 500 mg by mouth every 6 (six) hours as needed for mild pain.    Historical Provider, MD  ALPRAZolam Duanne Moron) 0.25 MG tablet Take 1 tablet (0.25 mg total) by mouth 2 (two) times daily as needed for anxiety. 12/22/16   [place], FNP  aspirin EC 81 MG tablet Take 81 mg by mouth daily.    Historical Provider, MD  butalbital-acetaminophen-caffeine (FIORICET, ESGIC) [phone] MG tablet Take 1 tablet by mouth 2 (two) times daily as needed for headache.  01/11/17   Eugenie Filler, MD  Diclofenac Sodium (PENNSAID) 2 % SOLN Place 1 application onto the skin 2 (two) times daily as needed. Apply to right shoulder Patient taking differently: Place 1 application onto the skin 2 (two) times daily as needed (pain). Apply to right shoulder 09/17/16   Flossie Buffy, NP  diltiazem (CARDIZEM CD) 240 MG 24 hr capsule Take 1 capsule (240 mg total) by mouth daily. 01/12/17   Eugenie Filler, MD  feeding supplement, ENSURE ENLIVE, (ENSURE ENLIVE) LIQD Take 237 mLs by mouth 4 (four) times daily. 01/11/17   Eugenie Filler, MD  fluticasone (FLONASE) 50 MCG/ACT nasal spray Place 2 sprays into both nostrils daily. 01/12/17   Eugenie Filler, MD  Fluticasone-Salmeterol (ADVAIR) 500-50 MCG/DOSE AEPB Inhale 1 puff into the lungs 2 (two) times daily. 04/19/16   Collene Gobble, MD  INCRUSE ELLIPTA 62.5 MCG/INH AEPB INHALE 1 PUFF INTO THE LUNGS DAILY 11/29/16   Magdalen Spatz, NP  ipratropium (ATROVENT) 0.02 % nebulizer solution Take 2.5 mLs (0.5 mg total) by nebulization every 4 (four) hours as needed for wheezing or shortness of breath. 01/11/17   Eugenie Filler, MD  levalbuterol [facility] HFA) 45 MCG/ACT inhaler Inhale 1-2 puffs into the lungs every 4 (four) hours as needed for wheezing. 01/11/17   Eugenie Filler, MD  levalbuterol Penne Lash) 0.63 MG/3ML nebulizer solution Take 3 mLs (0.63 mg total)  by nebulization every 4 (four) hours as needed for wheezing. Patient not taking: Reported on 01/24/2017 01/11/17   Eugenie Filler, MD  lidocaine (LIDODERM) 5 % Place 0.25 patches onto the skin daily as needed (pain). Remove & Discard patch within 12 hours or as directed by MD    Historical Provider, MD  loratadine (CLARITIN) 10 MG tablet Take 1 tablet (10 mg total) by mouth daily. 01/12/17   Eugenie Filler, MD  MELATONIN PO Take 1 tablet by mouth at bedtime as needed (sleep).    Historical Provider, MD  pantoprazole (PROTONIX) 40 MG tablet Take 1 tablet (40 mg total)  by mouth daily at 6 (six) AM. 01/11/17   Eugenie Filler, MD  Polyethyl Glycol-Propyl Glycol (SYSTANE OP) Place 1 drop into both eyes every 6 (six) hours as needed (dry eyes). Reported on 05/19/2016    Historical Provider, MD  polyethylene glycol (MIRALAX / GLYCOLAX) packet Take 17 g by mouth 2 (two) times daily. 01/11/17   Eugenie Filler, MD  predniSONE (DELTASONE) 10 MG tablet Take 3 tablets for 2 days, then take 2 tablets for 2 days, then take 1 tablet for 2 days. 01/19/17   Magdalen Spatz, NP  predniSONE (DELTASONE) 20 MG tablet Take 1-3 tablets (20-60 mg total) by mouth daily with breakfast. Take 3 tablets ('60mg'$ )daily x 3 days, then 2 tablets ('40mg'$ ) daily x 3 days, then 1 tablet('20mg'$ )daily x 3 days then stop. Patient not taking: Reported on 01/24/2017 01/12/17   Eugenie Filler, MD  promethazine (PHENERGAN) 12.5 MG tablet Take 12.5 mg by mouth every 6 (six) hours as needed for nausea or vomiting. Reported on 05/19/2016    Historical Provider, MD  senna-docusate (SENOKOT-S) 8.6-50 MG tablet Take 1 tablet by mouth 2 (two) times daily. 01/11/17   Eugenie Filler, MD    ALLERGIES:  Allergies  Allergen Reactions  . Penicillins Rash    Has patient had a PCN reaction causing immediate rash, facial/tongue/throat swelling, SOB or lightheadedness with hypotension: No Has patient had a PCN reaction causing severe rash involving mucus membranes or skin necrosis:NO Has patient had a PCN reaction that required hospitalization No Has patient had a PCN reaction occurring within the last 10 years:NO If all of the above answers are "NO", then may proceed with Cephalosporin use.    SOCIAL HISTORY:  Social History  Substance Use Topics  . Smoking status: Former Smoker    Packs/day: 1.00    Years: 30.00    Types: Cigarettes    Quit date: 08/18/2000  . Smokeless tobacco: Never Used  . Alcohol use No    FAMILY HISTORY: Family History  Problem Relation Age of Onset  . Cirrhosis Mother   .  Depression Mother   . Heart disease Father     EXAM: BP (!) 76/57   Pulse 98   Temp 100.1 F (37.8 C) (Rectal)   Resp (!) 24   Ht '5\' 3"'$  (1.6 m)   Wt 94 lb 9.2 oz (42.9 kg)   SpO2 98%   BMI 16.75 kg/m  CONSTITUTIONAL: Alert and in severe respiratory distress. Unable to answer questions appropriately. Can nod yes or no. HEAD: Normocephalic EYES: Conjunctivae clear, pupils appear equal, EOMI ENT: normal nose; dry mucous membranes NECK: Supple, no meningismus, no nuchal rigidity, no LAD  CARD: Regular and tachycardic; S1 and S2 appreciated; no murmurs, no clicks, no rubs, no gallops RESP: Patient is tachypneic, unable to answer questions, on BiPAP, patient sounds very tight  with very diminished aeration diffusely with some scattered rare wheezes. No rhonchi or rales. In severe respiratory distress. ABD/GI: Normal bowel sounds; non-distended; soft, non-tender, no rebound, no guarding, no peritoneal signs, no hepatosplenomegaly BACK:  The back appears normal and is non-tender to palpation, there is no CVA tenderness EXT: Normal ROM in all joints; non-tender to palpation; no edema; normal capillary refill; no cyanosis, no calf tenderness or swelling    SKIN: Normal color for age and race; warm; no rash NEURO: Moves all extremities equally   MEDICAL DECISION MAKING: Patient here severe respiratory distress. She also appears to be in either atrial fibrillation or SVT. She does report she is having chest pain. Despite her significant tachycardia, will give albuterol, Atrovent. We'll keep her on BiPAP and obtain an ABG. We'll also obtain chest x-ray, rectal temperature, labs.  ED PROGRESS: Patient's chest x-ray shows bibasilar infiltrates. She has a rectal temperature the 100.1. Will start 30 mL/kg IV fluid bolus with broad-spectrum antibiotics and I'm concerned for sepsis.   Patient given 6 mg and then 12 mg of adenosine. Her rhythm strips do show that she breaks into a sinus rhythm but only  stays there briefly before going back into SVT. At this time she has normal blood pressures and therefore I do not feel she needs to be cardioverted. We'll continue to hydrate patient and start her on a diltiazem drip. We'll advance diltiazem cautiously given I feel she is septic.    Patient's respiratory rate is improving on BiPAP and she is not able to speak short sentences. Aeration has also improved. Will repeat ABG. Heart rate is in the 130s on diltiazem. Blood pressures have improved with IV fluids. Lactate is 3.99. Patient has a leukocytosis of 40,000.     Sepsis - Repeat Assessment  Performed at:    3:00 AM  Vitals     Blood pressure 117/63, pulse (!) 138, temperature 100.1 F (37.8 C), temperature source Rectal, resp. rate 20, height '5\' 3"'$  (1.6 m), weight 94 lb 9.2 oz (42.9 kg), SpO2 100 %.  Heart:     Tachycardic  Lungs:    Wheezing  Capillary Refill:   <2 sec  Peripheral Pulse:   Radial pulse palpable  Skin:     Normal Color    3:00 AM  Pt's blood gas is worse despite BiPAP. She has a worsening respiratory acidosis. Decision made to intubate patient. At this time she is comfortable with this and is a full code. We'll discuss with critical care.  3:51 AM Discussed patient's case with critical care physician, Dr. Oletta Darter.  I have recommended admission and patient (and family if present) agree with this plan. Admitting physician will place admission orders.   I reviewed all nursing notes, vitals, pertinent previous records, EKGs, lab and urine results, imaging (as available).   It appears patient's lactate is worsening. This may be partially due to her respiratory status. She is continuing to receive IV fluids. Patient has had some hypotension on propofol. Will order Precedex instead. Heart rate has improved into the 90s.   CRITICAL CARE Performed by: Nyra Jabs   Total critical care time: 75 minutes  Critical care time was exclusive of separately billable  procedures and treating other patients.  Critical care was necessary to treat or prevent imminent or life-threatening deterioration.  Critical care was time spent personally by me on the following activities: development of treatment plan with patient and/or surrogate as well as nursing, discussions with consultants, evaluation  of patient's response to treatment, examination of patient, obtaining history from patient or surrogate, ordering and performing treatments and interventions, ordering and review of laboratory studies, ordering and review of radiographic studies, pulse oximetry and re-evaluation of patient's condition.    INTUBATION Performed by: Nyra Jabs  Required items: required blood products, implants, devices, and special equipment available Patient identity confirmed: provided demographic data and hospital-assigned identification number Time out: Immediately prior to procedure a "time out" was called to verify the correct patient, procedure, equipment, support staff and site/side marked as required.  Indications: Respiratory failure   Intubation method: Glidescope Laryngoscopy   Preoxygenation: BVM  Sedatives: 13 mg Etomidate Paralytic: 65 mg Succinylcholine  Tube Size: 7.5 cuffed  Post-procedure assessment: chest rise and ETCO2 monitor Breath sounds: equal and absent over the epigastrium Tube secured with: ETT holder Chest x-ray interpreted by radiologist and me.  Chest x-ray findings: endotracheal tube in appropriate position  Patient tolerated the procedure well with no immediate complications.     EKG Interpretation  Date/Time:  Sunday February 20 2017 00:13:15 EDT Ventricular Rate:  159 PR Interval:    QRS Duration: 87 QT Interval:  316 QTC Calculation: 514 R Axis:   159 Text Interpretation:  Supraventricular tachycardia Posterior infarct, acute (LCx) Anterolateral infarct, age indeterminate Artifact in lead(s) V1 V3 Confirmed by WARD,  DO, KRISTEN  [phone]) on 02/20/2017 8:51:51 AM          [place], DO 02/20/17 [place], DO 02/20/17 [id number]

## 2017-02-20 NOTE — ED Notes (Signed)
Patient arrives from home with complaint of shortness of breath. Hasn't felt well for several days. Called tonight because she had been self treating at home without success. Chronically on 2L via [place] at home; increased to 4L today. Patient took '10mg'$  of Albuterol via nebulizer over 4 doses at home and used rescue inhaler numerous time. FD arrived to find patient with O2 saturation @ 88% on 4L with labored breathing. EMS placed gave '125mg'$  Solu-Medrol, 2g of Magnesium, and 2 duo-nebs PTA. Patient normally takes cardizem via PO, but hasn't been taking for several days because she hasn't felt well.

## 2017-02-21 ENCOUNTER — Inpatient Hospital Stay (HOSPITAL_COMMUNITY): Payer: Medicare Other

## 2017-02-21 DIAGNOSIS — J9602 Acute respiratory failure with hypercapnia: Secondary | ICD-10-CM

## 2017-02-21 DIAGNOSIS — J441 Chronic obstructive pulmonary disease with (acute) exacerbation: Secondary | ICD-10-CM

## 2017-02-21 LAB — RESPIRATORY PANEL BY PCR
Adenovirus: NOT DETECTED
Bordetella pertussis: NOT DETECTED
CORONAVIRUS 229E-RVPPCR: NOT DETECTED
CORONAVIRUS OC43-RVPPCR: NOT DETECTED
Chlamydophila pneumoniae: NOT DETECTED
Coronavirus HKU1: NOT DETECTED
Coronavirus NL63: NOT DETECTED
INFLUENZA B-RVPPCR: NOT DETECTED
Influenza A: NOT DETECTED
MYCOPLASMA PNEUMONIAE-RVPPCR: NOT DETECTED
Metapneumovirus: NOT DETECTED
PARAINFLUENZA VIRUS 1-RVPPCR: NOT DETECTED
Parainfluenza Virus 2: NOT DETECTED
Parainfluenza Virus 3: NOT DETECTED
Parainfluenza Virus 4: NOT DETECTED
RESPIRATORY SYNCYTIAL VIRUS-RVPPCR: NOT DETECTED
Rhinovirus / Enterovirus: DETECTED — AB

## 2017-02-21 LAB — CBC
HCT: 37 % (ref 36.0–46.0)
Hemoglobin: 12.3 g/dL (ref 12.0–15.0)
MCH: 32.2 pg (ref 26.0–34.0)
MCHC: 33.2 g/dL (ref 30.0–36.0)
MCV: 96.9 fL (ref 78.0–100.0)
Platelets: 512 10*3/uL — ABNORMAL HIGH (ref 150–400)
RBC: 3.82 MIL/uL — ABNORMAL LOW (ref 3.87–5.11)
RDW: 12.9 % (ref 11.5–15.5)
WBC: 29.4 10*3/uL — ABNORMAL HIGH (ref 4.0–10.5)

## 2017-02-21 LAB — BASIC METABOLIC PANEL
Anion gap: 10 (ref 5–15)
BUN: 27 mg/dL — AB (ref 6–20)
CHLORIDE: 106 mmol/L (ref 101–111)
CO2: 21 mmol/L — AB (ref 22–32)
Calcium: 7.7 mg/dL — ABNORMAL LOW (ref 8.9–10.3)
Creatinine, Ser: 0.96 mg/dL (ref 0.44–1.00)
GFR calc Af Amer: >60 mL/min (ref >60)
GFR calc non Af Amer: >60 mL/min (ref >60)
GLUCOSE: 91 mg/dL (ref 65–99)
Potassium: 3.7 mmol/L (ref 3.5–5.1)
Sodium: 137 mmol/L (ref 135–145)

## 2017-02-21 LAB — URINE CULTURE: CULTURE: NO GROWTH

## 2017-02-21 LAB — GLUCOSE, CAPILLARY
GLUCOSE-CAPILLARY: 97 mg/dL (ref 65–99)
GLUCOSE-CAPILLARY: 196 mg/dL — AB (ref 65–99)
Glucose-Capillary: 77 mg/dL (ref 65–99)
Glucose-Capillary: 103 mg/dL — ABNORMAL HIGH (ref 65–99)
Glucose-Capillary: 142 mg/dL — ABNORMAL HIGH (ref 65–99)
Glucose-Capillary: 192 mg/dL — ABNORMAL HIGH (ref 65–99)

## 2017-02-21 LAB — LACTIC ACID, PLASMA
LACTIC ACID, VENOUS: 2 mmol/L — AB (ref 0.5–1.9)
LACTIC ACID, VENOUS: 0.9 mmol/L (ref 0.5–1.9)

## 2017-02-21 LAB — PROCALCITONIN: Procalcitonin: >150 ng/mL

## 2017-02-21 LAB — MAGNESIUM
Magnesium: 1.8 mg/dL (ref 1.7–2.4)
Magnesium: 1.7 mg/dL (ref 1.7–2.4)

## 2017-02-21 LAB — PHOSPHORUS
Phosphorus: 3.2 mg/dL (ref 2.5–4.6)
Phosphorus: 3.4 mg/dL (ref 2.5–4.6)

## 2017-02-21 MED ORDER — SODIUM CHLORIDE 0.9 % IV BOLUS (SEPSIS)
1000.0000 mL | Freq: Once | INTRAVENOUS | Status: AC
  Administered 2017-02-21: 1000 mL via INTRAVENOUS

## 2017-02-21 MED ORDER — ENOXAPARIN SODIUM 30 MG/0.3ML ~~LOC~~ SOLN
30.0000 mg | SUBCUTANEOUS | Status: DC
  Administered 2017-02-21 – 2017-02-23 (×3): 30 mg via SUBCUTANEOUS
  Filled 2017-02-21 (×3): qty 0.3

## 2017-02-21 MED ORDER — VITAL AF 1.2 CAL PO LIQD
1000.0000 mL | ORAL | Status: DC
  Administered 2017-02-21 – 2017-02-27 (×7): 1000 mL

## 2017-02-21 MED ORDER — PANTOPRAZOLE SODIUM 40 MG PO PACK
40.0000 mg | PACK | Freq: Every day | ORAL | Status: DC
  Administered 2017-02-21 – 2017-03-08 (×16): 40 mg
  Filled 2017-02-21 (×16): qty 20

## 2017-02-21 MED ORDER — SODIUM CHLORIDE 0.9 % IV SOLN
0.0000 ug/kg/h | INTRAVENOUS | Status: DC
  Administered 2017-02-21: 0.8 ug/kg/h via INTRAVENOUS
  Administered 2017-02-21: 0.4 ug/kg/h via INTRAVENOUS
  Filled 2017-02-21 (×2): qty 2

## 2017-02-21 NOTE — Progress Notes (Signed)
50 mls fentanyl wasted and witnessed per Regino Schultze RN .Precedex started per orders and NS bolus 1 liter started per orders

## 2017-02-21 NOTE — Progress Notes (Addendum)
Lactic acid 2  And RSV panel results + rhinovirus/Enterovirus resulted to Dr Wonda Amis. Dr Posey Pronto and Dr Allayne Stack that patient continues not to have urine output

## 2017-02-21 NOTE — Progress Notes (Signed)
I spoke with her daughter, Ms. Randel Pigg, who reviewed with me the events preceding her mother's admission. Over the two days prior to admission, her mother was not acting herself and had missed her doses of diltiazem due to cost. She also felt her mother was urinating less and as complaining of some back pain which made her worried about a kidney stone.   I reviewed her mother's current clinical course and the new change in mental status we saw today. She noted that both fentanyl and morphine made her mother act strangely when she was hospitalized in [place].   I expressed my concern over her urine output and how it suggests her kidneys may be declining. She acknowledged her mother has suffered a lot and would not want anything than her current level of care should her mother decline. She has struggled with chronic respiratory failure for many years and could barely perform basic tasks independently. We agreed to full scope of care for now but DNR should she decline despite our best efforts.  Her 3 children are on hospice care, so she wanted to take care of their needs before coming up to the unit later today. She thanked me for my time. She hopes for a recovery though is realistic about her mother's condition.

## 2017-02-21 NOTE — Progress Notes (Signed)
Patient opens eyes to pain does not track or follow commands PERRL .tremors noted .pateint is scratching abdomen but both arms are tense to movement. Dr Posey Pronto aware . Fentanyl decreased.

## 2017-02-21 NOTE — Progress Notes (Signed)
Initial Nutrition Assessment  DOCUMENTATION CODES:   Non-severe (moderate) malnutrition in context of chronic illness  INTERVENTION:    Vital AF 1.2 at 50 ml/h (1200 ml per day)  Provides 1440 kcal, 90 gm protein, 973 ml free water daily  NUTRITION DIAGNOSIS:   Malnutrition (moderate) related to chronic illness (COPD) as evidenced by mild depletion of body fat, moderate depletion of body fat, mild depletion of muscle mass, moderate depletions of muscle mass.  GOAL:   Patient will meet greater than or equal to 90% of their needs  MONITOR:   Vent status, TF tolerance, Labs, I & O's  REASON FOR ASSESSMENT:   Consult Enteral/tube feeding initiation and management  ASSESSMENT:   66 year old woman with COPD on home oxygen, hypertension hospitalized for acute on chronic respiratory failure.   Discussed patient in ICU rounds and with RN today. Received MD Consult for TF initiation and management. Currently receiving Vital High Protein at 40 ml/h with Pro-stat 30 ml BID to provide 1160 kcal, 114 gm protein, 803 ml free water daily. Tolerating TF well per RN. Patient is currently intubated on ventilator support MV: 10.9 L/min Temp (24hrs), Avg:98.7 F (37.1 C), Min:97.7 F (36.5 C), Max:99.9 F (37.7 C)  Labs reviewed. Medications reviewed and include potassium phosphate. Nutrition-Focused physical exam completed. Findings are mild-moderate fat depletion, mild-mderate muscle depletion, and no edema.   Diet Order:   NPO  Skin:  Reviewed, no issues  Last BM:  PTA  Height:   Ht Readings from Last 1 Encounters:  02/20/17 '5\' 3"'$  (1.6 m)    Weight:   Wt Readings from Last 1 Encounters:  02/21/17 113 lb 15.7 oz (51.7 kg)    Ideal Body Weight:  52.3 kg  BMI:  Body mass index is 20.19 kg/m.  Estimated Nutritional Needs:   Kcal:  1420  Protein:  75-90 gm  Fluid:  1.4 L  EDUCATION NEEDS:   No education needs identified at this time  Molli Barrows, [place],  [place], [place] Pager [phone] After Hours Pager [phone]

## 2017-02-21 NOTE — Progress Notes (Signed)
PULMONARY  / CRITICAL CARE MEDICINE  Name: Megan [name] MRN: [id number] DOB: [birth year]    LOS: 52  REFERRING MD :  ED  CHIEF COMPLAINT:  Shortness of breath  HISTORY OF PRESENT ILLNESS:  Megan [name] is a 66 year old woman with COPD on home oxygen, hypertension hospitalized for acute on chronic respiratory failure. As she is intubated, history was collected from the chart.  She presented via EMS with shortness of breath and appeared cyanotic in the ED. She received Duonebs x 2, Solumedrol 125 mg IV, Mg 2g by EMS. She acknowledged chest pain, productive cough as well as full code by nodding yes. In the ED, She received adenosine 6 mg and 12 mg IV given concern for SVT though did not return to normal sinus rhythm so was started on diltiazem gtt. She did not improve on BiPAP and was intubated.  INTERVAL HISTORY: This morning, she was not following commands or tracking my movements with her eyes. Still on phenylephrine.   PAST MEDICAL HISTORY :  Past Medical History:  Diagnosis Date  . Anemia   . Anxiety state 08/20/2015  . CAP (community acquired pneumonia)   . CHF (congestive heart failure) ([place])   . COPD (chronic obstructive pulmonary disease) ([place])   . Depression   . Essential hypertension 08/19/2015  . GERD (gastroesophageal reflux disease)   . Headache   . History of hiatal hernia   . Myocardial infarction ([place])   . Shortness of breath dyspnea    Past Surgical History:  Procedure Laterality Date  . CARDIAC CATHETERIZATION    . CARDIAC CATHETERIZATION N/A 08/26/2015   Procedure: Left Heart Cath and Coronary Angiography;  Surgeon: Jettie Booze, MD;  Location: [facility];  Service: Cardiovascular;  Laterality: N/A;   Prior to Admission medications   Medication Sig Start Date End Date Taking? Authorizing Provider  acetaminophen (TYLENOL) 500 MG tablet Take 500 mg by mouth every 6 (six) hours as needed for mild pain.    Historical Provider, MD  ALPRAZolam Duanne Moron)  0.25 MG tablet Take 1 tablet (0.25 mg total) by mouth 2 (two) times daily as needed for anxiety. 12/22/16   [place] Circle, FNP  aspirin EC 81 MG tablet Take 81 mg by mouth daily.    Historical Provider, MD  butalbital-acetaminophen-caffeine (FIORICET, ESGIC) [phone] MG tablet Take 1 tablet by mouth 2 (two) times daily as needed for headache. 01/11/17   Eugenie Filler, MD  Diclofenac Sodium (PENNSAID) 2 % SOLN Place 1 application onto the skin 2 (two) times daily as needed. Apply to right shoulder Patient taking differently: Place 1 application onto the skin 2 (two) times daily as needed (pain). Apply to right shoulder 09/17/16   Flossie Buffy, NP  diltiazem (CARDIZEM CD) 240 MG 24 hr capsule Take 1 capsule (240 mg total) by mouth daily. 01/12/17   Eugenie Filler, MD  feeding supplement, ENSURE ENLIVE, (ENSURE ENLIVE) LIQD Take 237 mLs by mouth 4 (four) times daily. 01/11/17   Eugenie Filler, MD  fluticasone (FLONASE) 50 MCG/ACT nasal spray Place 2 sprays into both nostrils daily. 01/12/17   Eugenie Filler, MD  Fluticasone-Salmeterol (ADVAIR) 500-50 MCG/DOSE AEPB Inhale 1 puff into the lungs 2 (two) times daily. 04/19/16   Collene Gobble, MD  INCRUSE ELLIPTA 62.5 MCG/INH AEPB INHALE 1 PUFF INTO THE LUNGS DAILY 11/29/16   Magdalen Spatz, NP  ipratropium (ATROVENT) 0.02 % nebulizer solution Take 2.5 mLs (0.5 mg total) by nebulization every  4 (four) hours as needed for wheezing or shortness of breath. 01/11/17   Eugenie Filler, MD  levalbuterol [facility] HFA) 45 MCG/ACT inhaler Inhale 1-2 puffs into the lungs every 4 (four) hours as needed for wheezing. 01/11/17   Eugenie Filler, MD  levalbuterol Penne Lash) 0.63 MG/3ML nebulizer solution Take 3 mLs (0.63 mg total) by nebulization every 4 (four) hours as needed for wheezing. Patient not taking: Reported on 01/24/2017 01/11/17   Eugenie Filler, MD  lidocaine (LIDODERM) 5 % Place 0.25 patches onto the skin daily as needed (pain). Remove &  Discard patch within 12 hours or as directed by MD    Historical Provider, MD  loratadine (CLARITIN) 10 MG tablet Take 1 tablet (10 mg total) by mouth daily. 01/12/17   Eugenie Filler, MD  MELATONIN PO Take 1 tablet by mouth at bedtime as needed (sleep).    Historical Provider, MD  pantoprazole (PROTONIX) 40 MG tablet Take 1 tablet (40 mg total) by mouth daily at 6 (six) AM. 01/11/17   Eugenie Filler, MD  Polyethyl Glycol-Propyl Glycol (SYSTANE OP) Place 1 drop into both eyes every 6 (six) hours as needed (dry eyes). Reported on 05/19/2016    Historical Provider, MD  polyethylene glycol (MIRALAX / GLYCOLAX) packet Take 17 g by mouth 2 (two) times daily. 01/11/17   Eugenie Filler, MD  predniSONE (DELTASONE) 10 MG tablet Take 3 tablets for 2 days, then take 2 tablets for 2 days, then take 1 tablet for 2 days. 01/19/17   Magdalen Spatz, NP  predniSONE (DELTASONE) 20 MG tablet Take 1-3 tablets (20-60 mg total) by mouth daily with breakfast. Take 3 tablets ('60mg'$ )daily x 3 days, then 2 tablets ('40mg'$ ) daily x 3 days, then 1 tablet('20mg'$ )daily x 3 days then stop. Patient not taking: Reported on 01/24/2017 01/12/17   Eugenie Filler, MD  promethazine (PHENERGAN) 12.5 MG tablet Take 12.5 mg by mouth every 6 (six) hours as needed for nausea or vomiting. Reported on 05/19/2016    Historical Provider, MD  senna-docusate (SENOKOT-S) 8.6-50 MG tablet Take 1 tablet by mouth 2 (two) times daily. 01/11/17   Eugenie Filler, MD   Allergies  Allergen Reactions  . Penicillins Rash    Has patient had a PCN reaction causing immediate rash, facial/tongue/throat swelling, SOB or lightheadedness with hypotension: No Has patient had a PCN reaction causing severe rash involving mucus membranes or skin necrosis:NO Has patient had a PCN reaction that required hospitalization No Has patient had a PCN reaction occurring within the last 10 years:NO If all of the above answers are "NO", then may proceed with Cephalosporin use.     FAMILY HISTORY:  Family History  Problem Relation Age of Onset  . Cirrhosis Mother   . Depression Mother   . Heart disease Father    SOCIAL HISTORY:  reports that she quit smoking about 16 years ago. Her smoking use included Cigarettes. She has a 30.00 pack-year smoking history. She has never used smokeless tobacco. She reports that she does not drink alcohol or use drugs.  VITAL SIGNS: Temp:  [97.4 F (36.3 C)-99.9 F (37.7 C)] 97.7 F (36.5 C) (04/23 0427) Pulse Rate:  [69-105] 90 (04/23 0700) Resp:  [18-28] 26 (04/23 0700) BP: (59-114)/(44-78) 79/66 (04/23 0700) SpO2:  [96 %-100 %] 99 % (04/23 0700) FiO2 (%):  [40 %-50 %] 40 % (04/23 0349) Weight:  [113 lb 15.7 oz (51.7 kg)] 113 lb 15.7 oz (51.7 kg) (04/23 0446) HEMODYNAMICS:  VENTILATOR SETTINGS: Vent Mode: PRVC FiO2 (%):  [40 %-50 %] 40 % Set Rate:  [26 bmp] 26 bmp Vt Set:  [420 mL] 420 mL PEEP:  [5 cmH20] 5 cmH20 Plateau Pressure:  [22 cmH20-28 cmH20] 28 cmH20 INTAKE / OUTPUT: Intake/Output      04/22 0701 - 04/23 0700 04/23 0701 - 04/24 0700   I.V. (mL/kg) 3007.7 (58.2)    NG/GT 720    IV Piggyback 1615    Total Intake(mL/kg) 5342.7 (103.3)    Urine (mL/kg/hr) 880 (0.7)    Total Output 880     Net +4462.7            Physical Exam  Constitutional: No distress.  Anxious-appearing  HENT:  Head: Normocephalic and atraumatic.  Eyes: Conjunctivae are normal. No scleral icterus.  Cardiovascular: Normal rate and regular rhythm.   Pulmonary/Chest: Effort normal. No respiratory distress.  Neurological:  Stiff and rigid. Not following commands.  Skin: She is not diaphoretic.      LABS: Cbc  Recent Labs Lab 02/20/17 0109 02/20/17 0139 02/21/17 0413  WBC 40.0*  --  29.4*  HGB 14.5 15.6* 12.3  HCT 42.8 46.0 37.0  PLT 641*  --  512*    Chemistry   Recent Labs Lab 02/20/17 0109 02/20/17 0139  02/20/17 1211 02/20/17 1647 02/21/17 0413  NA 136 134*  --   --   --  137  K 4.2 3.9  --   --    --  3.7  CL 98* 99*  --   --   --  106  CO2 23  --   --   --   --  21*  BUN 17 22*  --   --   --  27*  CREATININE 1.02* 1.00  --   --   --  0.96  CALCIUM 8.4*  --   --   --   --  7.7*  MG  --   --   < > 1.7 1.8 1.8  PHOS  --   --   --  1.6* 2.2* 3.2  GLUCOSE 76 77  --   --   --  91  < > = values in this interval not displayed.  Liver fxn No results for input(s): AST, ALT, ALKPHOS, BILITOT, PROT, ALBUMIN in the last 168 hours. coags No results for input(s): APTT, INR in the last 168 hours. Sepsis markers  Recent Labs Lab 02/20/17 0406 02/20/17 0702 02/20/17 1211 02/20/17 1647  LATICACIDVEN 5.60*  --  3.2* 3.4*  PROCALCITON  --  >150.00  --   --    Cardiac markers No results for input(s): CKTOTAL, CKMB, TROPONINI in the last 168 hours. BNP No results for input(s): PROBNP in the last 168 hours. ABG  Recent Labs Lab 02/20/17 0139 02/20/17 0220 02/20/17 0450 02/20/17 1112  PHART  --  7.049* 7.110* 7.246*  PCO2ART  --  91.2* 56.6* 46.9  PO2ART  --  277* 82.0* 130.0*  HCO3  --  23.6 17.8* 20.6  TCO2 26  --  19 22    CBG trend  Recent Labs Lab 02/20/17 1632 02/20/17 1915 02/20/17 2142 02/20/17 2335 02/21/17 0426  GLUCAP 219* 252* 170* 134* 51    Lines / Drains: 4/22 PIV x 2  Cultures: 4/22 >>  Antibiotics: 4/22 Azithro 4/22 Ceftriaxone  Tests / Events: 4/22 Intubated    DIAGNOSES: Active Problems:   COPD exacerbation (HCC)   Respiratory acidosis   Sepsis ([place])  ASSESSMENT / PLAN:  PULMONARY  ASSESSMENT: Acute on chronic hypercapnic respiratory failure: Multifactorial in the setting of COPD and possibly PNA. CHF listed on her problem list though EF 55-60% in October 2016 with non-ischemic disease on cardiac cath at that time. COPD: No PFTs on file.  PLAN:   Continue mechanical ventilation Continue bronchodilators and steroids    CARDIOVASCULAR  ASSESSMENT:  Sinus tachycardia  PLAN:  Titrate diltiazem gtt as tolerated.  Consider restart home dilt.   RENAL  ASSESSMENT:   Acute kidney injury: Crt 1.0, up from 0.6 at baseline. Suspect pre-renal in the setting of sepsis. Received 3.7L  Anion gap metabolic acidosis: Resolved.  PLAN:   Wean phenylephrine as tolerate Follow BMET   GASTROINTESTINAL  ASSESSMENT:   No active issues  PLAN:   Continue assessing   HEMATOLOGIC  ASSESSMENT:   Leukocytosis: 29,400, improved from 40,000 yesterday from fluids. Thrombocytosis: Reactive in the setting of inflammation.  PLAN:  Trend CBC   INFECTIOUS  ASSESSMENT:   Sepsis: Symptoms suggest PNA though no infiltrate on CXR. Flu negative yesterday. PCT >150 yesterday.  PLAN:   -Follow PCT -Continue cefepime, vanc, and azithromycin for HCAP coverage   ENDOCRINE  ASSESSMENT:   Hyperglycemia: Steroids put her at risk.    PLAN:   ICU glycemic protocol   NEUROLOGIC  ASSESSMENT:   Anxiety/depression: Xanax listed on home medication list and last filled 12/22/16 [0.25 mg x 60 tablets]. Acute encephalopathy: She received diphenhydramine earlier in the evening which may account for her presentation. Very different from how she appeared on exam yesterday morning.  PLAN:   Fentanyl gtt and versed prn Consider head CT   Charlott Rakes, PGY3 Internal Medicine Pager: [phone] 02/21/2017, 7:13 AM

## 2017-02-21 NOTE — Progress Notes (Signed)
Pt. Was transported to CT this AM without any complications.

## 2017-02-22 ENCOUNTER — Inpatient Hospital Stay (HOSPITAL_COMMUNITY): Payer: Medicare Other

## 2017-02-22 ENCOUNTER — Ambulatory Visit: Payer: Medicare Other | Admitting: Family

## 2017-02-22 DIAGNOSIS — J9600 Acute respiratory failure, unspecified whether with hypoxia or hypercapnia: Secondary | ICD-10-CM

## 2017-02-22 LAB — URINALYSIS, ROUTINE W REFLEX MICROSCOPIC
Bilirubin Urine: NEGATIVE
GLUCOSE, UA: 50 mg/dL — AB
Ketones, ur: NEGATIVE mg/dL
LEUKOCYTES UA: NEGATIVE
NITRITE: NEGATIVE
PROTEIN: 30 mg/dL — AB
SPECIFIC GRAVITY, URINE: 1.014 (ref 1.005–1.030)
pH: 5 (ref 5.0–8.0)

## 2017-02-22 LAB — CREATININE, URINE, RANDOM: Creatinine, Urine: 55.62 mg/dL

## 2017-02-22 LAB — BASIC METABOLIC PANEL
Anion gap: 8 (ref 5–15)
BUN: 48 mg/dL — AB (ref 6–20)
CALCIUM: 7.9 mg/dL — AB (ref 8.9–10.3)
CO2: 19 mmol/L — ABNORMAL LOW (ref 22–32)
CREATININE: 1.32 mg/dL — AB (ref 0.44–1.00)
Chloride: 110 mmol/L (ref 101–111)
GFR calc non Af Amer: 42 mL/min — ABNORMAL LOW (ref >60)
GFR, EST AFRICAN AMERICAN: 48 mL/min — AB (ref >60)
GLUCOSE: 238 mg/dL — AB (ref 65–99)
Potassium: 3.5 mmol/L (ref 3.5–5.1)
SODIUM: 137 mmol/L (ref 135–145)

## 2017-02-22 LAB — CBC
HCT: 31.3 % — ABNORMAL LOW (ref 36.0–46.0)
HEMOGLOBIN: 10.3 g/dL — AB (ref 12.0–15.0)
MCH: 31.9 pg (ref 26.0–34.0)
MCHC: 32.9 g/dL (ref 30.0–36.0)
MCV: 96.9 fL (ref 78.0–100.0)
Platelets: 307 10*3/uL (ref 150–400)
RBC: 3.23 MIL/uL — ABNORMAL LOW (ref 3.87–5.11)
RDW: 13.1 % (ref 11.5–15.5)
WBC: 15.3 10*3/uL — ABNORMAL HIGH (ref 4.0–10.5)

## 2017-02-22 LAB — TSH: TSH: 0.157 u[IU]/mL — AB (ref 0.350–4.500)

## 2017-02-22 LAB — GLUCOSE, CAPILLARY
GLUCOSE-CAPILLARY: 125 mg/dL — AB (ref 65–99)
Glucose-Capillary: 258 mg/dL — ABNORMAL HIGH (ref 65–99)
Glucose-Capillary: 190 mg/dL — ABNORMAL HIGH (ref 65–99)
Glucose-Capillary: 77 mg/dL (ref 65–99)
Glucose-Capillary: 164 mg/dL — ABNORMAL HIGH (ref 65–99)

## 2017-02-22 LAB — T4, FREE: Free T4: 0.87 ng/dL (ref 0.61–1.12)

## 2017-02-22 LAB — STREP PNEUMONIAE URINARY ANTIGEN: Strep Pneumo Urinary Antigen: POSITIVE — AB

## 2017-02-22 LAB — SODIUM, URINE, RANDOM

## 2017-02-22 LAB — PROCALCITONIN

## 2017-02-22 MED ORDER — SODIUM CHLORIDE 0.9 % IV SOLN
INTRAVENOUS | Status: DC
  Administered 2017-03-02: 18:00:00 via INTRAVENOUS

## 2017-02-22 MED ORDER — INSULIN ASPART 100 UNIT/ML ~~LOC~~ SOLN
0.0000 [IU] | SUBCUTANEOUS | Status: DC
  Administered 2017-02-22: 4 [IU] via SUBCUTANEOUS
  Administered 2017-02-22: 3 [IU] via SUBCUTANEOUS
  Administered 2017-02-23 (×4): 7 [IU] via SUBCUTANEOUS
  Administered 2017-02-23 (×2): 4 [IU] via SUBCUTANEOUS
  Administered 2017-02-24: 3 [IU] via SUBCUTANEOUS
  Administered 2017-02-24: 4 [IU] via SUBCUTANEOUS
  Administered 2017-02-24 (×2): 3 [IU] via SUBCUTANEOUS
  Administered 2017-02-24: 4 [IU] via SUBCUTANEOUS
  Administered 2017-02-25: 7 [IU] via SUBCUTANEOUS
  Administered 2017-02-25 (×3): 3 [IU] via SUBCUTANEOUS
  Administered 2017-02-26: 7 [IU] via SUBCUTANEOUS
  Administered 2017-02-26: 3 [IU] via SUBCUTANEOUS
  Administered 2017-02-26: 2 [IU] via SUBCUTANEOUS
  Administered 2017-02-27: 4 [IU] via SUBCUTANEOUS
  Administered 2017-02-27: 7 [IU] via SUBCUTANEOUS
  Administered 2017-02-27 – 2017-02-28 (×3): 3 [IU] via SUBCUTANEOUS
  Administered 2017-02-28: 4 [IU] via SUBCUTANEOUS
  Administered 2017-02-28 (×2): 3 [IU] via SUBCUTANEOUS
  Administered 2017-03-01: 11 [IU] via SUBCUTANEOUS
  Administered 2017-03-01 (×2): 3 [IU] via SUBCUTANEOUS
  Administered 2017-03-02 – 2017-03-03 (×3): 4 [IU] via SUBCUTANEOUS
  Administered 2017-03-03: 11 [IU] via SUBCUTANEOUS
  Administered 2017-03-03 – 2017-03-04 (×2): 3 [IU] via SUBCUTANEOUS
  Administered 2017-03-04 (×3): 4 [IU] via SUBCUTANEOUS
  Administered 2017-03-05: 7 [IU] via SUBCUTANEOUS
  Administered 2017-03-05: 11 [IU] via SUBCUTANEOUS
  Administered 2017-03-05: 3 [IU] via SUBCUTANEOUS
  Administered 2017-03-05 – 2017-03-06 (×2): 4 [IU] via SUBCUTANEOUS
  Administered 2017-03-06 (×3): 3 [IU] via SUBCUTANEOUS
  Administered 2017-03-07: 7 [IU] via SUBCUTANEOUS
  Administered 2017-03-07: 4 [IU] via SUBCUTANEOUS
  Administered 2017-03-07: 3 [IU] via SUBCUTANEOUS
  Administered 2017-03-07 – 2017-03-08 (×5): 4 [IU] via SUBCUTANEOUS

## 2017-02-22 MED ORDER — INSULIN ASPART 100 UNIT/ML ~~LOC~~ SOLN
0.0000 [IU] | SUBCUTANEOUS | Status: DC
  Administered 2017-02-22: 4 [IU] via SUBCUTANEOUS
  Administered 2017-02-22: 11 [IU] via SUBCUTANEOUS

## 2017-02-22 MED ORDER — FUROSEMIDE 10 MG/ML IJ SOLN
40.0000 mg | Freq: Once | INTRAMUSCULAR | Status: AC
  Administered 2017-02-22: 40 mg via INTRAVENOUS
  Filled 2017-02-22: qty 4

## 2017-02-22 MED ORDER — DEXMEDETOMIDINE HCL IN NACL 400 MCG/100ML IV SOLN
0.0000 ug/kg/h | INTRAVENOUS | Status: DC
  Administered 2017-02-22: 0.8 ug/kg/h via INTRAVENOUS
  Administered 2017-02-22: 0.6 ug/kg/h via INTRAVENOUS
  Administered 2017-02-23 (×3): 0.8 ug/kg/h via INTRAVENOUS
  Administered 2017-02-23: 1 ug/kg/h via INTRAVENOUS
  Administered 2017-02-24: 0.9 ug/kg/h via INTRAVENOUS
  Administered 2017-02-24: 1 ug/kg/h via INTRAVENOUS
  Administered 2017-02-24: 1.2 ug/kg/h via INTRAVENOUS
  Filled 2017-02-22 (×8): qty 100

## 2017-02-22 NOTE — Progress Notes (Signed)
PULMONARY  / CRITICAL CARE MEDICINE  Name: Megan [name] MRN: [id number] DOB: 12/14/1950    LOS: 2  REFERRING MD :  ED  CHIEF COMPLAINT:  Shortness of breath  HISTORY OF PRESENT ILLNESS:  Megan [name] is a 66 year old woman with COPD on home oxygen, hypertension hospitalized for acute on chronic respiratory failure. As she is intubated, history was collected from the chart.  She presented via EMS with shortness of breath and appeared cyanotic in the ED. She received Duonebs x 2, Solumedrol 125 mg IV, Mg 2g by EMS. She acknowledged chest pain, productive cough as well as full code by nodding yes. In the ED, She received adenosine 6 mg and 12 mg IV given concern for SVT though did not return to normal sinus rhythm so was started on diltiazem gtt. She did not improve on BiPAP and was intubated.  INTERVAL HISTORY: This morning, she appears more comfortable and opens eyes intermittently to command. Still with low urine output.  PAST MEDICAL HISTORY :  Past Medical History:  Diagnosis Date  . Anemia   . Anxiety state 08/20/2015  . CAP (community acquired pneumonia)   . CHF (congestive heart failure) ([place])   . COPD (chronic obstructive pulmonary disease) ([place])   . Depression   . Essential hypertension 08/19/2015  . GERD (gastroesophageal reflux disease)   . Headache   . History of hiatal hernia   . Myocardial infarction ([place])   . Shortness of breath dyspnea    Past Surgical History:  Procedure Laterality Date  . CARDIAC CATHETERIZATION    . CARDIAC CATHETERIZATION N/A 08/26/2015   Procedure: Left Heart Cath and Coronary Angiography;  Surgeon: Jettie Booze, MD;  Location: [place] [facility] [facility];  Service: Cardiovascular;  Laterality: N/A;   Prior to Admission medications   Medication Sig Start Date End Date Taking? Authorizing Provider  acetaminophen (TYLENOL) 500 MG tablet Take 500 mg by mouth every 6 (six) hours as needed for mild pain.    Historical Provider, MD  ALPRAZolam  Duanne Moron) 0.25 MG tablet Take 1 tablet (0.25 mg total) by mouth 2 (two) times daily as needed for anxiety. 12/22/16   Golden Circle, FNP  aspirin EC 81 MG tablet Take 81 mg by mouth daily.    Historical Provider, MD  butalbital-acetaminophen-caffeine (FIORICET, ESGIC) [phone] MG tablet Take 1 tablet by mouth 2 (two) times daily as needed for headache. 01/11/17   Eugenie Filler, MD  Diclofenac Sodium (PENNSAID) 2 % SOLN Place 1 application onto the skin 2 (two) times daily as needed. Apply to right shoulder Patient taking differently: Place 1 application onto the skin 2 (two) times daily as needed (pain). Apply to right shoulder 09/17/16   Flossie Buffy, NP  diltiazem (CARDIZEM CD) 240 MG 24 hr capsule Take 1 capsule (240 mg total) by mouth daily. 01/12/17   Eugenie Filler, MD  feeding supplement, ENSURE ENLIVE, (ENSURE ENLIVE) LIQD Take 237 mLs by mouth 4 (four) times daily. 01/11/17   Eugenie Filler, MD  fluticasone (FLONASE) 50 MCG/ACT nasal spray Place 2 sprays into both nostrils daily. 01/12/17   Eugenie Filler, MD  Fluticasone-Salmeterol (ADVAIR) 500-50 MCG/DOSE AEPB Inhale 1 puff into the lungs 2 (two) times daily. 04/19/16   Collene Gobble, MD  INCRUSE ELLIPTA 62.5 MCG/INH AEPB INHALE 1 PUFF INTO THE LUNGS DAILY 11/29/16   Magdalen Spatz, NP  ipratropium (ATROVENT) 0.02 % nebulizer solution Take 2.5 mLs (0.5 mg total) by nebulization every 4 (  four) hours as needed for wheezing or shortness of breath. 01/11/17   Eugenie Filler, MD  levalbuterol [facility] HFA) 45 MCG/ACT inhaler Inhale 1-2 puffs into the lungs every 4 (four) hours as needed for wheezing. 01/11/17   Eugenie Filler, MD  levalbuterol Penne Lash) 0.63 MG/3ML nebulizer solution Take 3 mLs (0.63 mg total) by nebulization every 4 (four) hours as needed for wheezing. Patient not taking: Reported on 01/24/2017 01/11/17   Eugenie Filler, MD  lidocaine (LIDODERM) 5 % Place 0.25 patches onto the skin daily as needed (pain).  Remove & Discard patch within 12 hours or as directed by MD    Historical Provider, MD  loratadine (CLARITIN) 10 MG tablet Take 1 tablet (10 mg total) by mouth daily. 01/12/17   Eugenie Filler, MD  MELATONIN PO Take 1 tablet by mouth at bedtime as needed (sleep).    Historical Provider, MD  pantoprazole (PROTONIX) 40 MG tablet Take 1 tablet (40 mg total) by mouth daily at 6 (six) AM. 01/11/17   Eugenie Filler, MD  Polyethyl Glycol-Propyl Glycol (SYSTANE OP) Place 1 drop into both eyes every 6 (six) hours as needed (dry eyes). Reported on 05/19/2016    Historical Provider, MD  polyethylene glycol (MIRALAX / GLYCOLAX) packet Take 17 g by mouth 2 (two) times daily. 01/11/17   Eugenie Filler, MD  predniSONE (DELTASONE) 10 MG tablet Take 3 tablets for 2 days, then take 2 tablets for 2 days, then take 1 tablet for 2 days. 01/19/17   Magdalen Spatz, NP  predniSONE (DELTASONE) 20 MG tablet Take 1-3 tablets (20-60 mg total) by mouth daily with breakfast. Take 3 tablets ('60mg'$ )daily x 3 days, then 2 tablets ('40mg'$ ) daily x 3 days, then 1 tablet('20mg'$ )daily x 3 days then stop. Patient not taking: Reported on 01/24/2017 01/12/17   Eugenie Filler, MD  promethazine (PHENERGAN) 12.5 MG tablet Take 12.5 mg by mouth every 6 (six) hours as needed for nausea or vomiting. Reported on 05/19/2016    Historical Provider, MD  senna-docusate (SENOKOT-S) 8.6-50 MG tablet Take 1 tablet by mouth 2 (two) times daily. 01/11/17   Eugenie Filler, MD   Allergies  Allergen Reactions  . Fentanyl And Related     Behavioral changes per daughter  . Penicillins Rash    Has patient had a PCN reaction causing immediate rash, facial/tongue/throat swelling, SOB or lightheadedness with hypotension: No Has patient had a PCN reaction causing severe rash involving mucus membranes or skin necrosis:NO Has patient had a PCN reaction that required hospitalization No Has patient had a PCN reaction occurring within the last 10 years:NO If all of  the above answers are "NO", then may proceed with Cephalosporin use.    FAMILY HISTORY:  Family History  Problem Relation Age of Onset  . Cirrhosis Mother   . Depression Mother   . Heart disease Father    SOCIAL HISTORY:  reports that she quit smoking about 16 years ago. Her smoking use included Cigarettes. She has a 30.00 pack-year smoking history. She has never used smokeless tobacco. She reports that she does not drink alcohol or use drugs.  VITAL SIGNS: Temp:  [97.3 F (36.3 C)-99.6 F (37.6 C)] 97.5 F (36.4 C) (04/24 0723) Pulse Rate:  [86-119] 107 (04/24 0730) Resp:  [12-26] 26 (04/24 0730) BP: (77-149)/(39-128) 109/56 (04/24 0730) SpO2:  [96 %-100 %] 100 % (04/24 0730) FiO2 (%):  [40 %] 40 % (04/24 0703) Weight:  [56.4 kg (124 lb  5.4 oz)] 56.4 kg (124 lb 5.4 oz) (04/24 0302) HEMODYNAMICS:   VENTILATOR SETTINGS: Vent Mode: PRVC FiO2 (%):  [40 %] 40 % Set Rate:  [26 bmp] 26 bmp Vt Set:  [420 mL] 420 mL PEEP:  [5 cmH20] 5 cmH20 Plateau Pressure:  [21 cmH20-27 cmH20] 27 cmH20 INTAKE / OUTPUT: Intake/Output      04/23 0701 - 04/24 0700 04/24 0701 - 04/25 0700   I.V. (mL/kg) 2564.9 (45.5)    NG/GT 1070    IV Piggyback 1500    Total Intake(mL/kg) 5134.9 (91)    Urine (mL/kg/hr) 975 (0.7) 50 (0.9)   Total Output 975 50   Net +4159.9 -50          Physical Exam  Constitutional: No distress.  HENT:  Head: Normocephalic and atraumatic.  Eyes: Conjunctivae are normal. No scleral icterus.  Cardiovascular: Regular rhythm.   Tachycardic  Pulmonary/Chest: Effort normal. No respiratory distress.  Abdominal: Soft. She exhibits no distension.  Neurological:  Opens eyes and nods intermittently to questions.  Skin: Skin is warm and dry. She is not diaphoretic.  Edema noted in the UE     LABS: Cbc  Recent Labs Lab 02/20/17 0109 02/20/17 0139 02/21/17 0413 02/22/17 0529  WBC 40.0*  --  29.4* 15.3*  HGB 14.5 15.6* 12.3 10.3*  HCT 42.8 46.0 37.0 31.3*  PLT 641*   --  512* 307    Chemistry   Recent Labs Lab 02/20/17 0109 02/20/17 0139  02/20/17 1647 02/21/17 0413 02/21/17 1814 02/22/17 0529  NA 136 134*  --   --  137  --  137  K 4.2 3.9  --   --  3.7  --  3.5  CL 98* 99*  --   --  106  --  110  CO2 23  --   --   --  21*  --  19*  BUN 17 22*  --   --  27*  --  48*  CREATININE 1.02* 1.00  --   --  0.96  --  1.32*  CALCIUM 8.4*  --   --   --  7.7*  --  7.9*  MG  --   --   < > 1.8 1.8 1.7  --   PHOS  --   --   < > 2.2* 3.2 3.4  --   GLUCOSE 76 77  --   --  91  --  238*  < > = values in this interval not displayed.  Sepsis markers  Recent Labs Lab 02/20/17 0702  02/20/17 1647 02/21/17 0829 02/21/17 1448 02/21/17 1814 02/22/17 0529  LATICACIDVEN  --   < > 3.4*  --  2.0* 0.9  --   PROCALCITON >150.00  --   --  >150.00  --   --  >150.00  < > = values in this interval not displayed.  ABG  Recent Labs Lab 02/20/17 0139 02/20/17 0220 02/20/17 0450 02/20/17 1112  PHART  --  7.049* 7.110* 7.246*  PCO2ART  --  91.2* 56.6* 46.9  PO2ART  --  277* 82.0* 130.0*  HCO3  --  23.6 17.8* 20.6  TCO2 26  --  19 22    CBG trend  Recent Labs Lab 02/21/17 1520 02/21/17 1957 02/21/17 2325 02/22/17 0311 02/22/17 0719  GLUCAP 142* 192* 196* 258* 190*    Lines / Drains: 4/22 PIV x 2  Cultures: 4/22 >> 4/23 RV > RSV/enterovirus+  Antibiotics: 4/22 Azithro 4/22 Ceftriaxone  Tests / Events: 4/22 Intubated  4/23 Acute encephalopathy. Head CT w/o acute findings.   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 CHF listed on her problem list though EF 55-60% in October 2016 with non-ischemic disease on cardiac cath at that time. COPD: No PFTs on file.  PLAN:   Continue mechanical ventilation Continue  bronchodilators and steroids    CARDIOVASCULAR  ASSESSMENT:  Sinus tachycardia: HR mostly 90s-100s off diltiazem.  PLAN:  IV fluids   RENAL  ASSESSMENT:   Acute kidney injury with oliguria: BUN 48/Crt 1.3, uptrending. Low urine output.  Lactic acidosis: Resolved  PLAN:   Follow BMET Check renal US, urine electrolytes, UA   GASTROINTESTINAL  ASSESSMENT:   No active issues   PLAN:   Continue assessing Continue Protonix 40 mg   HEMATOLOGIC  ASSESSMENT:   Leukocytosis: 15,300, improving. Thrombocytosis: Reactive in the setting of inflammation.  PLAN:  Trend CBC Continue Lovenox for DVT prophylaxis   INFECTIOUS  ASSESSMENT:   Sepsis: In the setting of RSV/enterococcal PNA.   PLAN:   -Follow PCT -Continue cefepime, vanc, and azithromycin for HCAP coverage   ENDOCRINE  ASSESSMENT:   Hyperglycemia: Steroid induced    PLAN:   ICU glycemic protocol   NEUROLOGIC  ASSESSMENT:   Anxiety/depression: Xanax listed on home medication list and last filled 12/22/16 [0.25 mg x 60 tablets]. Acute encephalopathy: Improving today. Suspect medication-related.   PLAN:   Avoid fentanyl and use Precedex gtt with Versed prn   Charlott Rakes, PGY3 Internal Medicine Pager: [phone] 02/22/2017, 8:02 AM

## 2017-02-22 NOTE — Progress Notes (Addendum)
Urine positive for strep pneumoniae antigen called to [facility]

## 2017-02-22 NOTE — Care Management Note (Addendum)
Case Management Note  Patient Details  Name: Megan [name] MRN: [id number] Date of Birth: Dec 23, 1950  Subjective/Objective:   Pt admitted with rhino and enterovirus                Action/Plan:   PTA from home but per daughter - pt is  not able to perform most ADL's.  Pt is now on ventilator - now DNR.   Pt has extensive respiratory history .  CM will continue to follow for discharge needs   Expected Discharge Date:                  Expected Discharge Plan:     In-House Referral:  Clinical Social Work  Discharge planning Services  CM Consult  Post Acute Care Choice:    Choice offered to:     DME Arranged:    DME Agency:     HH Arranged:    [place] Agency:     Status of Service:     If discussed at [employer], dates discussed:    Additional Comments:  Maryclare Labrador, RN 02/22/2017, 11:15 AM

## 2017-02-23 ENCOUNTER — Inpatient Hospital Stay (HOSPITAL_COMMUNITY): Payer: Medicare Other

## 2017-02-23 LAB — GLUCOSE, CAPILLARY
GLUCOSE-CAPILLARY: 167 mg/dL — AB (ref 65–99)
GLUCOSE-CAPILLARY: 215 mg/dL — AB (ref 65–99)
GLUCOSE-CAPILLARY: 225 mg/dL — AB (ref 65–99)
GLUCOSE-CAPILLARY: 225 mg/dL — AB (ref 65–99)
Glucose-Capillary: 116 mg/dL — ABNORMAL HIGH (ref 65–99)
Glucose-Capillary: 172 mg/dL — ABNORMAL HIGH (ref 65–99)
Glucose-Capillary: 225 mg/dL — ABNORMAL HIGH (ref 65–99)

## 2017-02-23 LAB — BASIC METABOLIC PANEL
ANION GAP: 5 (ref 5–15)
BUN: 53 mg/dL — ABNORMAL HIGH (ref 6–20)
CO2: 24 mmol/L (ref 22–32)
Calcium: 8.2 mg/dL — ABNORMAL LOW (ref 8.9–10.3)
Chloride: 109 mmol/L (ref 101–111)
Creatinine, Ser: 1.08 mg/dL — ABNORMAL HIGH (ref 0.44–1.00)
GFR calc non Af Amer: 53 mL/min — ABNORMAL LOW (ref >60)
Glucose, Bld: 239 mg/dL — ABNORMAL HIGH (ref 65–99)
Potassium: 3.6 mmol/L (ref 3.5–5.1)
SODIUM: 138 mmol/L (ref 135–145)

## 2017-02-23 LAB — CBC
HCT: 32.8 % — ABNORMAL LOW (ref 36.0–46.0)
HEMOGLOBIN: 10.7 g/dL — AB (ref 12.0–15.0)
MCH: 31.1 pg (ref 26.0–34.0)
MCHC: 32.6 g/dL (ref 30.0–36.0)
MCV: 95.3 fL (ref 78.0–100.0)
Platelets: 291 10*3/uL (ref 150–400)
RBC: 3.44 MIL/uL — AB (ref 3.87–5.11)
RDW: 13.2 % (ref 11.5–15.5)
WBC: 17.6 10*3/uL — AB (ref 4.0–10.5)

## 2017-02-23 LAB — BLOOD GAS, ARTERIAL
Acid-base deficit: 1 mmol/L (ref 0.0–2.0)
BICARBONATE: 25.4 mmol/L (ref 20.0–28.0)
Drawn by: 270221
FIO2: 0.4
LHR: 12 {breaths}/min
O2 Saturation: 97.4 %
PCO2 ART: 60.3 mmHg — AB (ref 32.0–48.0)
PEEP: 5 cmH2O
Patient temperature: 98
VT: 420 mL
pH, Arterial: 7.246 — ABNORMAL LOW (ref 7.350–7.450)
pO2, Arterial: 97.1 mmHg (ref 83.0–108.0)

## 2017-02-23 LAB — VANCOMYCIN, TROUGH: VANCOMYCIN TR: 70 ug/mL — AB (ref 15–20)

## 2017-02-23 LAB — LEGIONELLA PNEUMOPHILA SEROGP 1 UR AG: L. pneumophila Serogp 1 Ur Ag: NEGATIVE

## 2017-02-23 LAB — CK: Total CK: 224 U/L (ref 38–234)

## 2017-02-23 MED ORDER — INSULIN ASPART 100 UNIT/ML ~~LOC~~ SOLN
3.0000 [IU] | SUBCUTANEOUS | Status: DC
  Administered 2017-02-23 – 2017-03-03 (×31): 3 [IU] via SUBCUTANEOUS

## 2017-02-23 MED ORDER — FUROSEMIDE 10 MG/ML IJ SOLN
20.0000 mg | Freq: Once | INTRAMUSCULAR | Status: AC
  Administered 2017-02-23: 20 mg via INTRAVENOUS
  Filled 2017-02-23: qty 2

## 2017-02-23 MED ORDER — CHLORHEXIDINE GLUCONATE CLOTH 2 % EX PADS
6.0000 | MEDICATED_PAD | Freq: Every day | CUTANEOUS | Status: AC
  Administered 2017-02-24: 6 via TOPICAL

## 2017-02-23 MED ORDER — POTASSIUM CHLORIDE 20 MEQ/15ML (10%) PO SOLN
20.0000 meq | Freq: Every day | ORAL | Status: DC
  Administered 2017-02-23 – 2017-02-26 (×2): 20 meq
  Filled 2017-02-23 (×5): qty 15

## 2017-02-23 MED ORDER — METOPROLOL TARTRATE 5 MG/5ML IV SOLN
5.0000 mg | Freq: Four times a day (QID) | INTRAVENOUS | Status: DC | PRN
  Administered 2017-02-23 – 2017-02-25 (×7): 5 mg via INTRAVENOUS
  Filled 2017-02-23 (×7): qty 5

## 2017-02-23 MED ORDER — BISACODYL 10 MG RE SUPP
10.0000 mg | Freq: Once | RECTAL | Status: AC
  Administered 2017-02-23: 10 mg via RECTAL
  Filled 2017-02-23: qty 1

## 2017-02-23 NOTE — Progress Notes (Signed)
PULMONARY  / CRITICAL CARE MEDICINE  Name: Megan [name] MRN: [id number] DOB: 05-02-1951    LOS: 3  REFERRING MD :  ED  CHIEF COMPLAINT:  Shortness of breath  HISTORY OF PRESENT ILLNESS:  Megan [name] is a 66 year old woman with COPD on home oxygen, hypertension hospitalized for acute on chronic respiratory failure. As she is intubated, history was collected from the chart.  She presented via EMS with shortness of breath and appeared cyanotic in the ED. She received Duonebs x 2, Solumedrol 125 mg IV, Mg 2g by EMS. She acknowledged chest pain, productive cough as well as full code by nodding yes. In the ED, She received adenosine 6 mg and 12 mg IV given concern for SVT though did not return to normal sinus rhythm so was started on diltiazem gtt. She did not improve on BiPAP and was intubated.  INTERVAL HISTORY: This morning, she is awake and responds to questions appropriately.  PAST MEDICAL HISTORY :  Past Medical History:  Diagnosis Date  . Anemia   . Anxiety state 08/20/2015  . CAP (community acquired pneumonia)   . CHF (congestive heart failure) ([place])   . COPD (chronic obstructive pulmonary disease) ([place])   . Depression   . Essential hypertension 08/19/2015  . GERD (gastroesophageal reflux disease)   . Headache   . History of hiatal hernia   . Myocardial infarction ([place])   . Shortness of breath dyspnea    Past Surgical History:  Procedure Laterality Date  . CARDIAC CATHETERIZATION    . CARDIAC CATHETERIZATION N/A 08/26/2015   Procedure: Left Heart Cath and Coronary Angiography;  Surgeon: Jettie Booze, MD;  Location: [place] [facility];  Service: Cardiovascular;  Laterality: N/A;   Prior to Admission medications   Medication Sig Start Date End Date Taking? Authorizing Provider  acetaminophen (TYLENOL) 500 MG tablet Take 500 mg by mouth every 6 (six) hours as needed for mild pain.    Historical Provider, MD  ALPRAZolam Duanne Moron) 0.25 MG tablet Take 1 tablet (0.25 mg  total) by mouth 2 (two) times daily as needed for anxiety. 12/22/16   [place], FNP  aspirin EC 81 MG tablet Take 81 mg by mouth daily.    Historical Provider, MD  butalbital-acetaminophen-caffeine (FIORICET, ESGIC) [phone] MG tablet Take 1 tablet by mouth 2 (two) times daily as needed for headache. 01/11/17   Eugenie Filler, MD  Diclofenac Sodium (PENNSAID) 2 % SOLN Place 1 application onto the skin 2 (two) times daily as needed. Apply to right shoulder Patient taking differently: Place 1 application onto the skin 2 (two) times daily as needed (pain). Apply to right shoulder 09/17/16   Flossie Buffy, NP  diltiazem (CARDIZEM CD) 240 MG 24 hr capsule Take 1 capsule (240 mg total) by mouth daily. 01/12/17   Eugenie Filler, MD  feeding supplement, ENSURE ENLIVE, (ENSURE ENLIVE) LIQD Take 237 mLs by mouth 4 (four) times daily. 01/11/17   Eugenie Filler, MD  fluticasone (FLONASE) 50 MCG/ACT nasal spray Place 2 sprays into both nostrils daily. 01/12/17   Eugenie Filler, MD  Fluticasone-Salmeterol (ADVAIR) 500-50 MCG/DOSE AEPB Inhale 1 puff into the lungs 2 (two) times daily. 04/19/16   Collene Gobble, MD  INCRUSE ELLIPTA 62.5 MCG/INH AEPB INHALE 1 PUFF INTO THE LUNGS DAILY 11/29/16   Magdalen Spatz, NP  ipratropium (ATROVENT) 0.02 % nebulizer solution Take 2.5 mLs (0.5 mg total) by nebulization every 4 (four) hours as needed for wheezing or  shortness of breath. 01/11/17   Eugenie Filler, MD  levalbuterol [facility] HFA) 45 MCG/ACT inhaler Inhale 1-2 puffs into the lungs every 4 (four) hours as needed for wheezing. 01/11/17   Eugenie Filler, MD  levalbuterol Penne Lash) 0.63 MG/3ML nebulizer solution Take 3 mLs (0.63 mg total) by nebulization every 4 (four) hours as needed for wheezing. Patient not taking: Reported on 01/24/2017 01/11/17   Eugenie Filler, MD  lidocaine (LIDODERM) 5 % Place 0.25 patches onto the skin daily as needed (pain). Remove & Discard patch within 12 hours or as  directed by MD    Historical Provider, MD  loratadine (CLARITIN) 10 MG tablet Take 1 tablet (10 mg total) by mouth daily. 01/12/17   Eugenie Filler, MD  MELATONIN PO Take 1 tablet by mouth at bedtime as needed (sleep).    Historical Provider, MD  pantoprazole (PROTONIX) 40 MG tablet Take 1 tablet (40 mg total) by mouth daily at 6 (six) AM. 01/11/17   Eugenie Filler, MD  Polyethyl Glycol-Propyl Glycol (SYSTANE OP) Place 1 drop into both eyes every 6 (six) hours as needed (dry eyes). Reported on 05/19/2016    Historical Provider, MD  polyethylene glycol (MIRALAX / GLYCOLAX) packet Take 17 g by mouth 2 (two) times daily. 01/11/17   Eugenie Filler, MD  predniSONE (DELTASONE) 10 MG tablet Take 3 tablets for 2 days, then take 2 tablets for 2 days, then take 1 tablet for 2 days. 01/19/17   Magdalen Spatz, NP  predniSONE (DELTASONE) 20 MG tablet Take 1-3 tablets (20-60 mg total) by mouth daily with breakfast. Take 3 tablets ('60mg'$ )daily x 3 days, then 2 tablets ('40mg'$ ) daily x 3 days, then 1 tablet('20mg'$ )daily x 3 days then stop. Patient not taking: Reported on 01/24/2017 01/12/17   Eugenie Filler, MD  promethazine (PHENERGAN) 12.5 MG tablet Take 12.5 mg by mouth every 6 (six) hours as needed for nausea or vomiting. Reported on 05/19/2016    Historical Provider, MD  senna-docusate (SENOKOT-S) 8.6-50 MG tablet Take 1 tablet by mouth 2 (two) times daily. 01/11/17   Eugenie Filler, MD   Allergies  Allergen Reactions  . Fentanyl And Related     Behavioral changes per daughter  . Penicillins Rash    Has patient had a PCN reaction causing immediate rash, facial/tongue/throat swelling, SOB or lightheadedness with hypotension: No Has patient had a PCN reaction causing severe rash involving mucus membranes or skin necrosis:NO Has patient had a PCN reaction that required hospitalization No Has patient had a PCN reaction occurring within the last 10 years:NO If all of the above answers are "NO", then may proceed  with Cephalosporin use.    FAMILY HISTORY:  Family History  Problem Relation Age of Onset  . Cirrhosis Mother   . Depression Mother   . Heart disease Father    SOCIAL HISTORY:  reports that she quit smoking about 16 years ago. Her smoking use included Cigarettes. She has a 30.00 pack-year smoking history. She has never used smokeless tobacco. She reports that she does not drink alcohol or use drugs.  VITAL SIGNS: Temp:  [97.3 F (36.3 C)-97.6 F (36.4 C)] 97.3 F (36.3 C) (04/25 0428) Pulse Rate:  [100-139] 100 (04/25 0347) Resp:  [16-27] 26 (04/25 0700) BP: (97-154)/(50-83) 144/74 (04/25 0700) SpO2:  [97 %-100 %] 100 % (04/25 0347) FiO2 (%):  [40 %] 40 % (04/25 0347) Weight:  [57.3 kg (126 lb 5.2 oz)] 57.3 kg (126 lb 5.2  oz) (04/25 0311) HEMODYNAMICS:   VENTILATOR SETTINGS: Vent Mode: PRVC FiO2 (%):  [40 %] 40 % Set Rate:  [26 bmp] 26 bmp Vt Set:  [420 mL] 420 mL PEEP:  [5 cmH20] 5 cmH20 Pressure Support:  [8 cmH20] 8 cmH20 Plateau Pressure:  [18 cmH20-23 cmH20] 18 cmH20 INTAKE / OUTPUT: Intake/Output      04/24 0701 - 04/25 0700 04/25 0701 - 04/26 0700   I.V. (mL/kg) 981.8 (17.1)    NG/GT 1100    IV Piggyback 500    Total Intake(mL/kg) 2581.8 (45.1)    Urine (mL/kg/hr) 2780 (2)    Total Output 2780     Net -198.2            Physical Exam  Constitutional: No distress.  HENT:  Head: Normocephalic and atraumatic.  Eyes: Conjunctivae are normal. No scleral icterus.  Cardiovascular: Regular rhythm.   Tachycardic  Pulmonary/Chest: Effort normal. No respiratory distress.  Abdominal: Soft. She exhibits no distension.  Neurological:  Opens eyes and nods intermittently to questions.  Skin: Skin is warm and dry. She is not diaphoretic.  Edema noted in the UE, stable from yesterday     LABS: Cbc  Recent Labs Lab 02/21/17 0413 02/22/17 0529 02/23/17 0241  WBC 29.4* 15.3* 17.6*  HGB 12.3 10.3* 10.7*  HCT 37.0 31.3* 32.8*  PLT 512* 307 291     Chemistry   Recent Labs Lab 02/20/17 1647 02/21/17 0413 02/21/17 1814 02/22/17 0529 02/23/17 0241  NA  --  137  --  137 138  K  --  3.7  --  3.5 3.6  CL  --  106  --  110 109  CO2  --  21*  --  19* 24  BUN  --  27*  --  48* 53*  CREATININE  --  0.96  --  1.32* 1.08*  CALCIUM  --  7.7*  --  7.9* 8.2*  MG 1.8 1.8 1.7  --   --   PHOS 2.2* 3.2 3.4  --   --   GLUCOSE  --  91  --  238* 239*    Sepsis markers  Recent Labs Lab 02/20/17 0702  02/20/17 1647 02/21/17 0829 02/21/17 1448 02/21/17 1814 02/22/17 0529  LATICACIDVEN  --   < > 3.4*  --  2.0* 0.9  --   PROCALCITON >150.00  --   --  >150.00  --   --  >150.00  < > = values in this interval not displayed.  ABG  Recent Labs Lab 02/20/17 0139 02/20/17 0220 02/20/17 0450 02/20/17 1112  PHART  --  7.049* 7.110* 7.246*  PCO2ART  --  91.2* 56.6* 46.9  PO2ART  --  277* 82.0* 130.0*  HCO3  --  23.6 17.8* 20.6  TCO2 26  --  19 22    CBG trend  Recent Labs Lab 02/22/17 1142 02/22/17 1516 02/22/17 2014 02/23/17 0011 02/23/17 0425  GLUCAP 125* 77 164* 167* 225*    Lines / Drains: 4/22 PIV x 2  Cultures: 4/22 >> 4/23 RV > RSV/enterovirus+  Antibiotics: 4/22 Azithro 4/22 Ceftriaxone  Tests / Events: 4/22 Intubated  4/23 Acute encephalopathy. Head CT w/o acute findings.   DIAGNOSES: Active Problems:   COPD exacerbation (HCC)   Respiratory acidosis   Sepsis ([place])   ASSESSMENT / PLAN:  PULMONARY  ASSESSMENT: Acute on chronic hypercapnic respiratory failure: Multifactorial in the setting of COPD and rhinovirus/enterovirus PNA and possibly Strep pneumo urine Ag. CHF listed on  her problem list though EF 55-60% in October 2016 with non-ischemic disease on cardiac cath at that time. COPD: No PFTs on file.  PLAN:   Continue mechanical ventilation Continue bronchodilators and steroids    CARDIOVASCULAR  ASSESSMENT:  Sinus tachycardia: HR trending 100s-120s off diltiazem.    PLAN:  Holding IV fluids   RENAL  ASSESSMENT:   Acute kidney injury with oliguria: BUN 58/Crt 1.0. Urine output improved yesterday with IV Lasix. FeNa 2% favors pre-renal etiology.  PLAN:   Follow BMET Consider repeat Lasix 40 mg IV today   GASTROINTESTINAL  ASSESSMENT:   No active issues   PLAN:   Continue assessing Continue Protonix 40 mg   HEMATOLOGIC  ASSESSMENT:   Leukocytosis: 17,600, mildly up from yesterday. Suspect some component of dehydration. Thrombocytosis: Reactive in the setting of inflammation.  PLAN:  Trend CBC Continue Lovenox for DVT prophylaxis   INFECTIOUS  ASSESSMENT:   Sepsis: In the setting of rhinovirus/enterovirus PNA and possibly Strep pneumo.  PLAN:   -Follow PCT -Continue cefepime, vanc, and azithromycin for HCAP coverage   ENDOCRINE  ASSESSMENT:   Hyperglycemia: Steroid induced    PLAN:    ICU glycemic protocol   NEUROLOGIC  ASSESSMENT:   Anxiety/depression: Xanax listed on home medication list and last filled 12/22/16 [0.25 mg x 60 tablets]. Acute encephalopathy: Improved again today. Suspect medication-related.   PLAN:   Continue Precedex gtt with Versed prn   Charlott Rakes, PGY3 Internal Medicine Pager: [phone] 02/23/2017, 8:04 AM

## 2017-02-23 NOTE — Progress Notes (Signed)
Results for CLAUDIE, BRICKHOUSE (MRN [id number]) as of 02/23/2017 09:55  Ref. Range 02/22/2017 15:16 02/22/2017 20:14 02/23/2017 00:11 02/23/2017 04:25 02/23/2017 08:21  Glucose-Capillary Latest Ref Range: 65 - 99 mg/dL 77 164 (H) 167 (H) 225 (H) 225 (H)  Noted that blood sugars are elevated and greater than 180 mg/dl. Recommend the ICU hyperglycemia protocol while in ICU, on tube feedings and on ventilator.   Recommend Novolog 4-5 units every 4 hours for tube feed coverage along with the Novolog RESISTANT correction scale every 4 hours if ICU protocol is not used and if blood sugars continue to be greater than 180 mg/dl.    Harvel Ricks RN BSN CDE Diabetes Coordinator Pager: [phone]  8am-5pm

## 2017-02-23 NOTE — Progress Notes (Signed)
Pharmacy Antibiotic Note  FYI, vanc trough this am of 70 is in error; lab was drawn while vanc was infusing.  Will check trough prior to tomorrow's dose.   Wynona Neat, PharmD, BCPS  02/23/2017 3:57 AM

## 2017-02-24 ENCOUNTER — Inpatient Hospital Stay (HOSPITAL_COMMUNITY): Payer: Medicare Other

## 2017-02-24 LAB — GLUCOSE, CAPILLARY
GLUCOSE-CAPILLARY: 127 mg/dL — AB (ref 65–99)
GLUCOSE-CAPILLARY: 135 mg/dL — AB (ref 65–99)
GLUCOSE-CAPILLARY: 136 mg/dL — AB (ref 65–99)
GLUCOSE-CAPILLARY: 183 mg/dL — AB (ref 65–99)
Glucose-Capillary: 173 mg/dL — ABNORMAL HIGH (ref 65–99)
Glucose-Capillary: 136 mg/dL — ABNORMAL HIGH (ref 65–99)

## 2017-02-24 LAB — CBC
HCT: 35 % — ABNORMAL LOW (ref 36.0–46.0)
Hemoglobin: 11.1 g/dL — ABNORMAL LOW (ref 12.0–15.0)
MCH: 30.8 pg (ref 26.0–34.0)
MCHC: 31.7 g/dL (ref 30.0–36.0)
MCV: 97.2 fL (ref 78.0–100.0)
Platelets: 346 10*3/uL (ref 150–400)
RBC: 3.6 MIL/uL — ABNORMAL LOW (ref 3.87–5.11)
RDW: 13.3 % (ref 11.5–15.5)
WBC: 24.7 10*3/uL — ABNORMAL HIGH (ref 4.0–10.5)

## 2017-02-24 LAB — BASIC METABOLIC PANEL
Anion gap: 6 (ref 5–15)
BUN: 52 mg/dL — AB (ref 6–20)
CO2: 30 mmol/L (ref 22–32)
CREATININE: 0.92 mg/dL (ref 0.44–1.00)
Calcium: 9 mg/dL (ref 8.9–10.3)
Chloride: 107 mmol/L (ref 101–111)
GFR calc Af Amer: >60 mL/min (ref >60)
GFR calc non Af Amer: >60 mL/min (ref >60)
GLUCOSE: 136 mg/dL — AB (ref 65–99)
Potassium: 4.1 mmol/L (ref 3.5–5.1)
Sodium: 143 mmol/L (ref 135–145)

## 2017-02-24 LAB — BLOOD GAS, ARTERIAL
Acid-Base Excess: 3.3 mmol/L — ABNORMAL HIGH (ref 0.0–2.0)
Bicarbonate: 30.6 mmol/L — ABNORMAL HIGH (ref 20.0–28.0)
DRAWN BY: 40415
FIO2: 40
MECHVT: 420 mL
O2 SAT: 96.5 %
PATIENT TEMPERATURE: 98.6
PCO2 ART: 79.4 mmHg — AB (ref 32.0–48.0)
PEEP: 5 cmH2O
PO2 ART: 95 mmHg (ref 83.0–108.0)
RATE: 12 resp/min
pH, Arterial: 7.211 — ABNORMAL LOW (ref 7.350–7.450)

## 2017-02-24 LAB — PHOSPHORUS: PHOSPHORUS: 2.1 mg/dL — AB (ref 2.5–4.6)

## 2017-02-24 LAB — MAGNESIUM: Magnesium: 2 mg/dL (ref 1.7–2.4)

## 2017-02-24 LAB — PROCALCITONIN: Procalcitonin: 31.89 ng/mL

## 2017-02-24 MED ORDER — DEXTROSE 5 % IV SOLN
2.0000 g | Freq: Three times a day (TID) | INTRAVENOUS | Status: DC
  Filled 2017-02-24: qty 2

## 2017-02-24 MED ORDER — POTASSIUM PHOSPHATES 15 MMOLE/5ML IV SOLN
10.0000 mmol | Freq: Once | INTRAVENOUS | Status: AC
  Administered 2017-02-24: 10 mmol via INTRAVENOUS
  Filled 2017-02-24: qty 3.33

## 2017-02-24 MED ORDER — FUROSEMIDE 10 MG/ML IJ SOLN
40.0000 mg | Freq: Once | INTRAMUSCULAR | Status: AC
  Administered 2017-02-24: 40 mg via INTRAVENOUS
  Filled 2017-02-24: qty 4

## 2017-02-24 MED ORDER — DEXTROSE 5 % IV SOLN
1.0000 g | Freq: Three times a day (TID) | INTRAVENOUS | Status: DC
  Filled 2017-02-24: qty 1

## 2017-02-24 MED ORDER — DEXTROSE 5 % IV SOLN
1.0000 g | Freq: Three times a day (TID) | INTRAVENOUS | Status: AC
  Administered 2017-02-24 – 2017-02-27 (×11): 1 g via INTRAVENOUS
  Filled 2017-02-24 (×11): qty 1

## 2017-02-24 MED ORDER — ENOXAPARIN SODIUM 40 MG/0.4ML ~~LOC~~ SOLN
40.0000 mg | SUBCUTANEOUS | Status: DC
  Administered 2017-02-24 – 2017-02-28 (×5): 40 mg via SUBCUTANEOUS
  Filled 2017-02-24 (×5): qty 0.4

## 2017-02-24 NOTE — Progress Notes (Signed)
PULMONARY  / CRITICAL CARE MEDICINE  Name: Megan [name] MRN: [id number] DOB: Feb 09, 1951    LOS: 43  REFERRING MD :  ED  CHIEF COMPLAINT:  Shortness of breath  HISTORY OF PRESENT ILLNESS:  Megan [name] is a 66 year old woman with COPD on home oxygen, hypertension hospitalized for acute on chronic respiratory failure. As she is intubated, history was collected from the chart.  She presented via EMS with shortness of breath and appeared cyanotic in the ED. She received Duonebs x 2, Solumedrol 125 mg IV, Mg 2g by EMS. She acknowledged chest pain, productive cough as well as full code by nodding yes. In the ED, She received adenosine 6 mg and 12 mg IV given concern for SVT though did not return to normal sinus rhythm so was started on diltiazem gtt. She did not improve on BiPAP and was intubated.  INTERVAL HISTORY: This morning, she looked uncomfortable with increased work of breathing. Rate was dropped yesterday to improve air trapping.  PAST MEDICAL HISTORY :  Past Medical History:  Diagnosis Date  . Anemia   . Anxiety state 08/20/2015  . CAP (community acquired pneumonia)   . CHF (congestive heart failure) ([place])   . COPD (chronic obstructive pulmonary disease) ([place])   . Depression   . Essential hypertension 08/19/2015  . GERD (gastroesophageal reflux disease)   . Headache   . History of hiatal hernia   . Myocardial infarction ([place])   . Shortness of breath dyspnea    Past Surgical History:  Procedure Laterality Date  . CARDIAC CATHETERIZATION    . CARDIAC CATHETERIZATION N/A 08/26/2015   Procedure: Left Heart Cath and Coronary Angiography;  Surgeon: Jettie Booze, MD;  Location: [facility];  Service: Cardiovascular;  Laterality: N/A;   Prior to Admission medications   Medication Sig Start Date End Date Taking? Authorizing Provider  acetaminophen (TYLENOL) 500 MG tablet Take 500 mg by mouth every 6 (six) hours as needed for mild pain.    Historical Provider, MD   ALPRAZolam Duanne Moron) 0.25 MG tablet Take 1 tablet (0.25 mg total) by mouth 2 (two) times daily as needed for anxiety. 12/22/16   [place] Circle, FNP  aspirin EC 81 MG tablet Take 81 mg by mouth daily.    Historical Provider, MD  butalbital-acetaminophen-caffeine (FIORICET, ESGIC) [phone] MG tablet Take 1 tablet by mouth 2 (two) times daily as needed for headache. 01/11/17   Eugenie Filler, MD  Diclofenac Sodium (PENNSAID) 2 % SOLN Place 1 application onto the skin 2 (two) times daily as needed. Apply to right shoulder Patient taking differently: Place 1 application onto the skin 2 (two) times daily as needed (pain). Apply to right shoulder 09/17/16   Flossie Buffy, NP  diltiazem (CARDIZEM CD) 240 MG 24 hr capsule Take 1 capsule (240 mg total) by mouth daily. 01/12/17   Eugenie Filler, MD  feeding supplement, ENSURE ENLIVE, (ENSURE ENLIVE) LIQD Take 237 mLs by mouth 4 (four) times daily. 01/11/17   Eugenie Filler, MD  fluticasone (FLONASE) 50 MCG/ACT nasal spray Place 2 sprays into both nostrils daily. 01/12/17   Eugenie Filler, MD  Fluticasone-Salmeterol (ADVAIR) 500-50 MCG/DOSE AEPB Inhale 1 puff into the lungs 2 (two) times daily. 04/19/16   Collene Gobble, MD  INCRUSE ELLIPTA 62.5 MCG/INH AEPB INHALE 1 PUFF INTO THE LUNGS DAILY 11/29/16   Magdalen Spatz, NP  ipratropium (ATROVENT) 0.02 % nebulizer solution Take 2.5 mLs (0.5 mg total) by nebulization every  4 (four) hours as needed for wheezing or shortness of breath. 01/11/17   Eugenie Filler, MD  levalbuterol [facility] HFA) 45 MCG/ACT inhaler Inhale 1-2 puffs into the lungs every 4 (four) hours as needed for wheezing. 01/11/17   Eugenie Filler, MD  levalbuterol Penne Lash) 0.63 MG/3ML nebulizer solution Take 3 mLs (0.63 mg total) by nebulization every 4 (four) hours as needed for wheezing. Patient not taking: Reported on 01/24/2017 01/11/17   Eugenie Filler, MD  lidocaine (LIDODERM) 5 % Place 0.25 patches onto the skin daily as needed  (pain). Remove & Discard patch within 12 hours or as directed by MD    Historical Provider, MD  loratadine (CLARITIN) 10 MG tablet Take 1 tablet (10 mg total) by mouth daily. 01/12/17   Eugenie Filler, MD  MELATONIN PO Take 1 tablet by mouth at bedtime as needed (sleep).    Historical Provider, MD  pantoprazole (PROTONIX) 40 MG tablet Take 1 tablet (40 mg total) by mouth daily at 6 (six) AM. 01/11/17   Eugenie Filler, MD  Polyethyl Glycol-Propyl Glycol (SYSTANE OP) Place 1 drop into both eyes every 6 (six) hours as needed (dry eyes). Reported on 05/19/2016    Historical Provider, MD  polyethylene glycol (MIRALAX / GLYCOLAX) packet Take 17 g by mouth 2 (two) times daily. 01/11/17   Eugenie Filler, MD  predniSONE (DELTASONE) 10 MG tablet Take 3 tablets for 2 days, then take 2 tablets for 2 days, then take 1 tablet for 2 days. 01/19/17   Magdalen Spatz, NP  predniSONE (DELTASONE) 20 MG tablet Take 1-3 tablets (20-60 mg total) by mouth daily with breakfast. Take 3 tablets ('60mg'$ )daily x 3 days, then 2 tablets ('40mg'$ ) daily x 3 days, then 1 tablet('20mg'$ )daily x 3 days then stop. Patient not taking: Reported on 01/24/2017 01/12/17   Eugenie Filler, MD  promethazine (PHENERGAN) 12.5 MG tablet Take 12.5 mg by mouth every 6 (six) hours as needed for nausea or vomiting. Reported on 05/19/2016    Historical Provider, MD  senna-docusate (SENOKOT-S) 8.6-50 MG tablet Take 1 tablet by mouth 2 (two) times daily. 01/11/17   Eugenie Filler, MD   Allergies  Allergen Reactions  . Fentanyl And Related     Behavioral changes per daughter  . Penicillins Rash    Has patient had a PCN reaction causing immediate rash, facial/tongue/throat swelling, SOB or lightheadedness with hypotension: No Has patient had a PCN reaction causing severe rash involving mucus membranes or skin necrosis:NO Has patient had a PCN reaction that required hospitalization No Has patient had a PCN reaction occurring within the last 10  years:NO If all of the above answers are "NO", then may proceed with Cephalosporin use.    FAMILY HISTORY:  Family History  Problem Relation Age of Onset  . Cirrhosis Mother   . Depression Mother   . Heart disease Father    SOCIAL HISTORY:  reports that she quit smoking about 16 years ago. Her smoking use included Cigarettes. She has a 30.00 pack-year smoking history. She has never used smokeless tobacco. She reports that she does not drink alcohol or use drugs.  VITAL SIGNS: Temp:  [97.7 F (36.5 C)-98.3 F (36.8 C)] 98.3 F (36.8 C) (04/26 0402) Pulse Rate:  [29-139] 137 (04/26 0600) Resp:  [14-27] 24 (04/26 0600) BP: (112-160)/(64-98) 160/93 (04/26 0600) SpO2:  [95 %-100 %] 97 % (04/26 0600) FiO2 (%):  [40 %] 40 % (04/26 0400) Weight:  [55.9 kg (123  lb 3.8 oz)] 55.9 kg (123 lb 3.8 oz) (04/26 0300) HEMODYNAMICS:   VENTILATOR SETTINGS: Vent Mode: PRVC FiO2 (%):  [40 %] 40 % Set Rate:  [12 bmp-26 bmp] 12 bmp Vt Set:  [420 mL] 420 mL PEEP:  [4 cmH20-5 cmH20] 4 cmH20 Plateau Pressure:  [17 cmH20-26 cmH20] 25 cmH20 INTAKE / OUTPUT: Intake/Output      04/25 0701 - 04/26 0700 04/26 0701 - 04/27 0700   I.V. (mL/kg) 490.7 (8.8)    NG/GT 1290    IV Piggyback 250    Total Intake(mL/kg) 2030.7 (36.3)    Urine (mL/kg/hr) 2425 (1.8)    Total Output 2425     Net -394.3            Physical Exam  Constitutional: No distress.  HENT:  Head: Normocephalic and atraumatic.  Eyes: Conjunctivae are normal. No scleral icterus.  Cardiovascular: Regular rhythm.   Tachycardic  Pulmonary/Chest: Effort normal. No respiratory distress.  Abdominal: Soft. She exhibits no distension.  Neurological:  Resists my attempts to open her eyes for exam  Skin: Skin is warm and dry. She is not diaphoretic.  Improving UE edema     LABS: Cbc  Recent Labs Lab 02/22/17 0529 02/23/17 0241 02/24/17 0238  WBC 15.3* 17.6* 24.7*  HGB 10.3* 10.7* 11.1*  HCT 31.3* 32.8* 35.0*  PLT 307 291 346     Chemistry   Recent Labs Lab 02/21/17 0413 02/21/17 1814 02/22/17 0529 02/23/17 0241 02/24/17 0238  NA 137  --  137 138 143  K 3.7  --  3.5 3.6 4.1  CL 106  --  110 109 107  CO2 21*  --  19* 24 30  BUN 27*  --  48* 53* 52*  CREATININE 0.96  --  1.32* 1.08* 0.92  CALCIUM 7.7*  --  7.9* 8.2* 9.0  MG 1.8 1.7  --   --  2.0  PHOS 3.2 3.4  --   --  2.1*  GLUCOSE 91  --  238* 239* 136*    Sepsis markers  Recent Labs Lab 02/20/17 0702  02/20/17 1647 02/21/17 0829 02/21/17 1448 02/21/17 1814 02/22/17 0529  LATICACIDVEN  --   < > 3.4*  --  2.0* 0.9  --   PROCALCITON >150.00  --   --  >150.00  --   --  >150.00  < > = values in this interval not displayed.  ABG  Recent Labs Lab 02/20/17 0139  02/20/17 0450 02/20/17 1112 02/23/17 1110  PHART  --   < > 7.110* 7.246* 7.246*  PCO2ART  --   < > 56.6* 46.9 60.3*  PO2ART  --   < > 82.0* 130.0* 97.1  HCO3  --   < > 17.8* 20.6 25.4  TCO2 26  --  19 22  --   < > = values in this interval not displayed.  CBG trend  Recent Labs Lab 02/23/17 1130 02/23/17 1539 02/23/17 2007 02/23/17 2334 02/24/17 0405  GLUCAP 225* 215* 172* 116* 183*    Lines / Drains: 4/22 PIV x 2  Cultures: 4/22 >> 4/23 RV > RSV/enterovirus+  Antibiotics: 4/22 Vanc > 4/25 4/22 Azithro > 4/25 4/22 Ceftriaxone x 1 4/23 Cefepime    Tests / Events: 4/22 Intubated  4/23 Acute encephalopathy. Head CT w/o acute findings.   DIAGNOSES: Active Problems:   COPD exacerbation (HCC)   Respiratory acidosis   Sepsis ([place])   ASSESSMENT / PLAN:  PULMONARY  ASSESSMENT: Acute on chronic hypercapnic  respiratory failure: Multifactorial in the setting of COPD and rhinovirus/enterovirus PNA and possibly Strep pneumo urine Ag. CHF listed on her problem list though EF 55-60% in October 2016 with non-ischemic disease on cardiac cath at that time. COPD: No PFTs on file.  PLAN:   Check STAT ABG Continue mechanical ventilation Continue  bronchodilators and steroids    CARDIOVASCULAR  ASSESSMENT:  Sinus tachycardia: HR trending 120s-130s off diltiazem and with prn metoprolol.   PLAN:  Holding IV fluids Continue metoprolol 5 mg every 6 hours as needed   RENAL  ASSESSMENT:   Acute kidney injury with oliguria: BUN 52/Crt 0.9. Urine output improved yesterday with IV Lasix. FeNa 2% favors pre-renal etiology.  PLAN:   Follow BMET Consider repeat Lasix 40 mg IV today   GASTROINTESTINAL  ASSESSMENT:   No active issues   PLAN:   Continue assessing Continue Protonix 40 mg   HEMATOLOGIC  ASSESSMENT:   Leukocytosis: 24,700 up from 17,600. Suspect some component of dehydration plus steroids. Thrombocytosis: Reactive in the setting of inflammation.  PLAN:  Trend CBC Continue Lovenox for DVT prophylaxis   INFECTIOUS  ASSESSMENT:   Sepsis: In the setting of rhinovirus/enterovirus PNA and possibly Strep pneumo.  PLAN:   Continue cefepime for Strep pneumo coverage Recheck procalcitonin   ENDOCRINE  ASSESSMENT:   Hyperglycemia: Steroid induced. Added standing insulin 3 units now with CBGs < 200.  PLAN:    ICU glycemic protocol   NEUROLOGIC  ASSESSMENT:   Anxiety/depression: Xanax listed on home medication list and last filled 12/22/16 [0.25 mg x 60 tablets]. Acute encephalopathy: Somewhat worse today possibly CO2 narcosis.  PLAN:   Continue Precedex gtt with Versed prn ABG as noted above   Charlott Rakes, PGY3 Internal Medicine Pager: [phone] 02/24/2017, 7:02 AM

## 2017-02-24 NOTE — Progress Notes (Signed)
I met with her daughter [name] at bedside and reviewed her her mother's clinical course and the difficulty we have had with weaning her off the vent. In reviewing her mother's goals of care, her daughter agrees she would never want to be committed indefinitely to [facility] and agrees that we should do what we can to minimize suffering and focus on comfort. She wanted to make a few phone calls to gather family members to be at bedside.  She also asked me to update her brother [name] by way of cell phone. I shared the news with him and sensed his alarm and anxiety. He won't be able to get here until Saturday/Sunday. I told him he will continue her current management though she might decline despite our best efforts. He agreed and will try to make arrangement to be at her bedside in her final moments.  [name] told me she did not want her mother to suffer and felt that were she continuing to suffer that we should let her know and proceed with one-way extubation. She felt her brother had plenty of opportunities to see her mother and did not take advantage of them. She will call family and make arrangements. She appreciated my time and will let us know how we can help.

## 2017-02-24 NOTE — Progress Notes (Signed)
Pharmacy Antibiotic Note  Megan [name] is a 66 y.o. female admitted on 02/20/2017 with pneumonia.  Pharmacy has been consulted for Cefepime dosing.  Renal function improving - ok to increase Cefepime.  WBC up today. Need to increase Cefepime to increase time > MIC.  UOP improved but augmented with Lasix administration.   Plan: The dose of Cefepime will be adjusted to 1g IV q8h based on renal function.  Will watch BUN/SCr/UOP closely and adjust if needed  Height: '5\' 3"'$  (160 cm) Weight: 123 lb 3.8 oz (55.9 kg) IBW/kg (Calculated) : 52.4  Temp (24hrs), Avg:98.2 F (36.8 C), Min:97.7 F (36.5 C), Max:98.5 F (36.9 C)   Recent Labs Lab 02/20/17 0109  02/20/17 0139 02/20/17 0406 02/20/17 1211 02/20/17 1647 02/21/17 0413 02/21/17 1448 02/21/17 1814 02/22/17 0529 02/23/17 0241 02/24/17 0238  WBC 40.0*  --   --   --   --   --  29.4*  --   --  15.3* 17.6* 24.7*  CREATININE 1.02*  --  1.00  --   --   --  0.96  --   --  1.32* 1.08* 0.92  LATICACIDVEN  --   < > 3.99* 5.60* 3.2* 3.4*  --  2.0* 0.9  --   --   --   VANCOTROUGH  --   --   --   --   --   --   --   --   --   --  70*  --   < > = values in this interval not displayed.  Estimated Creatinine Clearance: 51.1 mL/min (by C-G formula based on SCr of 0.92 mg/dL).    Allergies  Allergen Reactions  . Fentanyl And Related     Behavioral changes per daughter  . Penicillins Rash    Has patient had a PCN reaction causing immediate rash, facial/tongue/throat swelling, SOB or lightheadedness with hypotension: No Has patient had a PCN reaction causing severe rash involving mucus membranes or skin necrosis:NO Has patient had a PCN reaction that required hospitalization No Has patient had a PCN reaction occurring within the last 10 years:NO If all of the above answers are "NO", then may proceed with Cephalosporin use.    Antimicrobials this admission: Vanc 4/22>> 4/25 Cefepime 4/22>> Azithro 4/23>>4/25 Levaquin x1 4/22  Dose  adjustments this admission: 4/26- Incr Cefepime to 1g IV q8h  Microbiology results: 4/22 Influenza - NEG 4/22 MRSA - POS 4/22 Blood - NGTD x2d 4/22 Urine - NEG 4/23 Resp Panel - Rhinovirus/Enterovirus  4/24 Strep pneumo UAg - positive  4/24 Legionella UAg - negative  Thank you for allowing pharmacy to be a part of this patient's care.  Sloan Leiter, PharmD, BCPS Clinical Pharmacist Clinical phone 02/24/2017 until 3:30 PM- [phone] After hours, please call #[phone] 02/24/2017 8:33 AM

## 2017-02-24 NOTE — Progress Notes (Signed)
   02/24/17 1810  Clinical Encounter Type  Visited With Patient  Visit Type Other (Comment) ([place] consult)  Spiritual Encounters  Spiritual Needs Prayer  Stress Factors  Patient Stress Factors Major life changes  Stopped by room to offer RC prayers of peace and comfort.

## 2017-02-25 ENCOUNTER — Inpatient Hospital Stay (HOSPITAL_COMMUNITY): Payer: Medicare Other

## 2017-02-25 DIAGNOSIS — J9601 Acute respiratory failure with hypoxia: Secondary | ICD-10-CM

## 2017-02-25 DIAGNOSIS — G9340 Encephalopathy, unspecified: Secondary | ICD-10-CM

## 2017-02-25 LAB — GLUCOSE, CAPILLARY
GLUCOSE-CAPILLARY: 104 mg/dL — AB (ref 65–99)
GLUCOSE-CAPILLARY: 120 mg/dL — AB (ref 65–99)
GLUCOSE-CAPILLARY: 228 mg/dL — AB (ref 65–99)
GLUCOSE-CAPILLARY: 230 mg/dL — AB (ref 65–99)
Glucose-Capillary: 118 mg/dL — ABNORMAL HIGH (ref 65–99)
Glucose-Capillary: 136 mg/dL — ABNORMAL HIGH (ref 65–99)
Glucose-Capillary: 124 mg/dL — ABNORMAL HIGH (ref 65–99)

## 2017-02-25 LAB — CULTURE, BLOOD (ROUTINE X 2)
CULTURE: NO GROWTH
CULTURE: NO GROWTH
SPECIAL REQUESTS: ADEQUATE
Special Requests: ADEQUATE

## 2017-02-25 LAB — CBC
HEMATOCRIT: 35.3 % — AB (ref 36.0–46.0)
HEMOGLOBIN: 11.1 g/dL — AB (ref 12.0–15.0)
MCH: 31 pg (ref 26.0–34.0)
MCHC: 31.4 g/dL (ref 30.0–36.0)
MCV: 98.6 fL (ref 78.0–100.0)
PLATELETS: 299 10*3/uL (ref 150–400)
RBC: 3.58 MIL/uL — AB (ref 3.87–5.11)
RDW: 13.2 % (ref 11.5–15.5)
WBC: 15.9 10*3/uL — ABNORMAL HIGH (ref 4.0–10.5)

## 2017-02-25 LAB — BASIC METABOLIC PANEL
Anion gap: 9 (ref 5–15)
BUN: 44 mg/dL — ABNORMAL HIGH (ref 6–20)
CHLORIDE: 99 mmol/L — AB (ref 101–111)
CO2: 37 mmol/L — AB (ref 22–32)
Calcium: 8.5 mg/dL — ABNORMAL LOW (ref 8.9–10.3)
Creatinine, Ser: 0.73 mg/dL (ref 0.44–1.00)
GFR calc Af Amer: >60 mL/min (ref >60)
GFR calc non Af Amer: >60 mL/min (ref >60)
GLUCOSE: 124 mg/dL — AB (ref 65–99)
Potassium: 3.6 mmol/L (ref 3.5–5.1)
SODIUM: 145 mmol/L (ref 135–145)

## 2017-02-25 LAB — PHOSPHORUS: Phosphorus: 2.4 mg/dL — ABNORMAL LOW (ref 2.5–4.6)

## 2017-02-25 LAB — PROCALCITONIN: Procalcitonin: 19.16 ng/mL

## 2017-02-25 LAB — MAGNESIUM: MAGNESIUM: 1.9 mg/dL (ref 1.7–2.4)

## 2017-02-25 MED ORDER — METOPROLOL TARTRATE 5 MG/5ML IV SOLN
5.0000 mg | Freq: Four times a day (QID) | INTRAVENOUS | Status: DC | PRN
  Administered 2017-02-26 – 2017-03-03 (×7): 5 mg via INTRAVENOUS
  Filled 2017-02-25 (×10): qty 5

## 2017-02-25 MED ORDER — SODIUM CHLORIDE 0.9 % IV SOLN: 0.4000 ug/kg/h | INTRAVENOUS | Status: DC

## 2017-02-25 MED ORDER — METOPROLOL TARTRATE 5 MG/5ML IV SOLN
5.0000 mg | Freq: Four times a day (QID) | INTRAVENOUS | Status: DC
  Administered 2017-02-25: 5 mg via INTRAVENOUS
  Filled 2017-02-25: qty 5

## 2017-02-25 MED ORDER — MAGNESIUM SULFATE 2 GM/50ML IV SOLN
2.0000 g | Freq: Once | INTRAVENOUS | Status: AC
  Administered 2017-02-25: 2 g via INTRAVENOUS
  Filled 2017-02-25: qty 50

## 2017-02-25 MED ORDER — DEXMEDETOMIDINE HCL IN NACL 400 MCG/100ML IV SOLN
0.4000 ug/kg/h | INTRAVENOUS | Status: DC
  Administered 2017-02-25 – 2017-02-26 (×5): 1.2 ug/kg/h via INTRAVENOUS
  Filled 2017-02-25 (×6): qty 100

## 2017-02-25 MED ORDER — POTASSIUM PHOSPHATES 15 MMOLE/5ML IV SOLN
30.0000 mmol | Freq: Once | INTRAVENOUS | Status: AC
  Administered 2017-02-25: 30 mmol via INTRAVENOUS
  Filled 2017-02-25: qty 10

## 2017-02-25 NOTE — Progress Notes (Signed)
I met with her daughter [name] today this afternoon. Her mother was more alert and able to nod yes/no in response to my questions. [name] called her brother in [place], and he was available by speaker though his voice was often inaudible.    We reviewed the options of one-way extubation vs tracheostomy. He had doing some reading about tracheostomy and wanted to hope that she could eventually wean off of it in the future. I explained weaning off required addressing the underlying issue and often was difficult in the setting of longstanding COPD. I invited Dr. Nelda Marseille to speak to him since some of his questions were beyond my realm of experience.  Ultimately, we acknowledged she made some headway as her lab work suggested improvement but that we would ultimately know early next week and would have to decide accordingly. Her mental status change yesterday suggested immediate decision-making, but her improvement suggested we had time. He indicated he planned to be there on Monday for a family meeting once we had all the information together.  [name] and her brother appreciated our time. She went to her mother's room and indicated that her mother indicated wanting a trach and wished to have her sign documentation to that effect. As she was still on Precedex, we explained it would be illegal for [place] to pursue this action but that [name] could keep this information in hand as we get closer to the family meeting.

## 2017-02-25 NOTE — Progress Notes (Signed)
PULMONARY  / CRITICAL CARE MEDICINE  Name: Megan [name] MRN: [id number] DOB: 11-Oct-1951    LOS: 70  REFERRING MD :  ED  CHIEF COMPLAINT:  Shortness of breath  HISTORY OF PRESENT ILLNESS:  Megan [name] is a 65 year old woman with COPD on home oxygen, hypertension hospitalized for acute on chronic respiratory failure. As she is intubated, history was collected from the chart.  She presented via EMS with shortness of breath and appeared cyanotic in the ED. She received Duonebs x 2, Solumedrol 125 mg IV, Mg 2g by EMS. She acknowledged chest pain, productive cough as well as full code by nodding yes. In the ED, She received adenosine 6 mg and 12 mg IV given concern for SVT though did not return to normal sinus rhythm so was started on diltiazem gtt. She did not improve on BiPAP and was intubated.  INTERVAL HISTORY: This morning, she appeared more comfortable and was able to open eyes on command. I spoke with her daughter yesterday that she has been difficult to wean off the vent and would thus favor comfort care though her improved mental status yesterday made her wonder if trach would be possible per nursing report.  VITAL SIGNS: Temp:  [97.4 F (36.3 C)-98.5 F (36.9 C)] 97.4 F (36.3 C) (04/27 0338) Pulse Rate:  [98-145] 127 (04/27 0600) Resp:  [13-27] 19 (04/27 0600) BP: (105-164)/(54-135) 127/72 (04/27 0600) SpO2:  [97 %-100 %] 98 % (04/27 0600) FiO2 (%):  [40 %] 40 % (04/27 0334) Weight:  [54.9 kg (121 lb)] 54.9 kg (121 lb) (04/27 0410) HEMODYNAMICS:   VENTILATOR SETTINGS: Vent Mode: PRVC FiO2 (%):  [40 %] 40 % Set Rate:  [12 bmp-17 bmp] 12 bmp Vt Set:  [420 mL] 420 mL PEEP:  [5 cmH20] 5 cmH20 Plateau Pressure:  [15 cmH20-224 cmH20] 15 cmH20 INTAKE / OUTPUT: Intake/Output      04/26 0701 - 04/27 0700 04/27 0701 - 04/28 0700   I.V. (mL/kg) 470.1 (8.6)    NG/GT 1150    IV Piggyback 415.2    Total Intake(mL/kg) 2035.3 (37.1)    Urine (mL/kg/hr) 4865 (3.7)    Total Output 4865      Net -2829.8           Physical Exam  Constitutional: No distress.  HENT:  Head: Normocephalic and atraumatic.  Eyes: Conjunctivae are normal. No scleral icterus.  Cardiovascular: Regular rhythm.   Tachycardic  Pulmonary/Chest: Effort normal. No respiratory distress.  Abdominal: Soft. She exhibits no distension.  Neurological:  Opens eyes on command though unable to wiggle toes.  Skin: Skin is warm and dry. She is not diaphoretic.  Less generalized edema   LABS: Cbc  Recent Labs Lab 02/22/17 0529 02/23/17 0241 02/24/17 0238  WBC 15.3* 17.6* 24.7*  HGB 10.3* 10.7* 11.1*  HCT 31.3* 32.8* 35.0*  PLT 307 291 346   Chemistry  Recent Labs Lab 02/21/17 1814  02/23/17 0241 02/24/17 0238 02/25/17 0301  NA  --   < > 138 143 145  K  --   < > 3.6 4.1 3.6  CL  --   < > 109 107 99*  CO2  --   < > 24 30 37*  BUN  --   < > 53* 52* 44*  CREATININE  --   < > 1.08* 0.92 0.73  CALCIUM  --   < > 8.2* 9.0 8.5*  MG 1.7  --   --  2.0 1.9  PHOS 3.4  --   --  2.1* 2.4*  GLUCOSE  --   < > 239* 136* 124*  < > = values in this interval not displayed.  Sepsis markers  Recent Labs Lab 02/20/17 1647  02/21/17 1448 02/21/17 1814 02/22/17 0529 02/24/17 0839 02/25/17 0301  LATICACIDVEN 3.4*  --  2.0* 0.9  --   --   --   PROCALCITON  --   < >  --   --  >150.00 31.89 19.16  < > = values in this interval not displayed.  ABG  Recent Labs Lab 02/20/17 0139  02/20/17 0450 02/20/17 1112 02/23/17 1110 02/24/17 0655  PHART  --   < > 7.110* 7.246* 7.246* 7.211*  PCO2ART  --   < > 56.6* 46.9 60.3* 79.4*  PO2ART  --   < > 82.0* 130.0* 97.1 95.0  HCO3  --   < > 17.8* 20.6 25.4 30.6*  TCO2 26  --  19 22  --   --   < > = values in this interval not displayed.  CBG trend  Recent Labs Lab 02/24/17 1141 02/24/17 1508 02/24/17 1925 02/24/17 2352 02/25/17 0337  GLUCAP 173* 136* 127* 135* 104*    Lines / Drains: 4/22 PIV x 2  Cultures: 4/22 >> 4/23 RV >  RSV/enterovirus+  Antibiotics: 4/22 Vanc > 4/25 4/22 Azithro > 4/25 4/22 Ceftriaxone x 1 4/23 Cefepime>>>  Tests / Events: 4/22 Intubated  4/23 Acute encephalopathy. Head CT w/o acute findings.  DIAGNOSES: Active Problems:   COPD exacerbation (HCC)   Respiratory acidosis   Sepsis ([place])  ASSESSMENT / PLAN:  PULMONARY  ASSESSMENT: Acute on chronic hypercapnic respiratory failure: Multifactorial in the setting of COPD and rhinovirus/enterovirus PNA and Strep pneumo urine Ag. CHF listed on her problem list though EF 55-60% in October 2016 with non-ischemic disease on cardiac cath at that time. COPD: No PFTs on file.  PLAN:   Continue mechanical ventilation while allowing for permissive hypercapnia. Continue bronchodilators and steroids. Give Lasix 40 mg IVx1 dose.  CARDIOVASCULAR  ASSESSMENT:  Sinus tachycardia: HR trending 120s-130s with prn metoprolol.  PLAN:  Holding IV fluids Schedule metoprolol 5 mg every 6 hours   RENAL  ASSESSMENT:   Acute kidney injury with oliguria: BUN 44/Crt 0.7. FeNa 2% favors pre-renal etiology. Net -3.1L yesterday though still +8.8L since admission.  PLAN:   Follow BMET Repeat Lasix 40 mg IV today  GASTROINTESTINAL  ASSESSMENT:   No active issues   PLAN:   Continue bowel regimen and tube feeds. Continue Protonix 40 mg x1 dose IV.  HEMATOLOGIC  ASSESSMENT:   Leukocytosis: CBC pending this morning Thrombocytosis: Reactive in the setting of inflammation.  PLAN:  Trend CBC Continue Lovenox for DVT prophylaxis  INFECTIOUS  ASSESSMENT:   Sepsis: In the setting of rhinovirus/enterovirus PNA and possibly Strep pneumo. PCT 19.2, downtrending.  PLAN:   Continue cefepime for Strep pneumo coverage Follow procalcitonin  ENDOCRINE  ASSESSMENT:   Hyperglycemia: Steroid induced though CBGs improved <200.   PLAN:    ICU glycemic protocol with 3 units standing order  NEUROLOGIC  ASSESSMENT:    Anxiety/depression: Xanax listed on home medication list and last filled 12/22/16 [0.25 mg x 60 tablets]. Acute encephalopathy: Multifactorial in the setting of hypercarbia and sepsis though improved this morning.  PLAN:   Continue Precedex gtt with Versed prn  Megan [name], PGY3 Internal Medicine Pager: [phone] 02/25/2017, 7:07 AM   Attending Note:  66 year old female with extensive pulmonary history including COPD on O2.  Patient  developed rhinovirus and ensuing respiratory failure.  Patient's mental status improved over time.  Remains DNR.  On exam, with diminished BS diffusely.  I reviewed CXR myself, hyperinflation noted.  Discussed with PCCM-resident.  Awaiting arrival of daughter to discuss trach/peg vs one way extubation.  Will continue cefepime for now and place a stop date at day 8.  F/U on cultures.  Need to have a family meeting.  The patient is critically ill with multiple organ systems failure and requires high complexity decision making for assessment and support, frequent evaluation and titration of therapies, application of advanced monitoring technologies and extensive interpretation of multiple databases.   Critical Care Time devoted to patient care services described in this note is  35  Minutes. This time reflects time of care of this signee Dr Jennet Maduro. This critical care time does not reflect procedure time, or teaching time or supervisory time of PA/NP/Med student/Med Resident etc but could involve care discussion time.  Rush Farmer, M.D. [facility] Pulmonary/Critical Care Medicine. Pager: [phone]. After hours pager: [phone].

## 2017-02-26 ENCOUNTER — Inpatient Hospital Stay (HOSPITAL_COMMUNITY): Payer: Medicare Other

## 2017-02-26 DIAGNOSIS — J9620 Acute and chronic respiratory failure, unspecified whether with hypoxia or hypercapnia: Secondary | ICD-10-CM

## 2017-02-26 LAB — BASIC METABOLIC PANEL WITH GFR
Anion gap: 10 (ref 5–15)
BUN: 34 mg/dL — ABNORMAL HIGH (ref 6–20)
CO2: 39 mmol/L — ABNORMAL HIGH (ref 22–32)
Calcium: 8.2 mg/dL — ABNORMAL LOW (ref 8.9–10.3)
Chloride: 94 mmol/L — ABNORMAL LOW (ref 101–111)
Creatinine, Ser: 0.54 mg/dL (ref 0.44–1.00)
GFR calc Af Amer: >60 mL/min
GFR calc non Af Amer: >60 mL/min
Glucose, Bld: 123 mg/dL — ABNORMAL HIGH (ref 65–99)
Potassium: 3.9 mmol/L (ref 3.5–5.1)
Sodium: 143 mmol/L (ref 135–145)

## 2017-02-26 LAB — MAGNESIUM: MAGNESIUM: 2 mg/dL (ref 1.7–2.4)

## 2017-02-26 LAB — GLUCOSE, CAPILLARY
GLUCOSE-CAPILLARY: 228 mg/dL — AB (ref 65–99)
GLUCOSE-CAPILLARY: 58 mg/dL — AB (ref 65–99)
GLUCOSE-CAPILLARY: 65 mg/dL (ref 65–99)
GLUCOSE-CAPILLARY: 82 mg/dL (ref 65–99)
Glucose-Capillary: 100 mg/dL — ABNORMAL HIGH (ref 65–99)
Glucose-Capillary: 179 mg/dL — ABNORMAL HIGH (ref 65–99)
Glucose-Capillary: 182 mg/dL — ABNORMAL HIGH (ref 65–99)

## 2017-02-26 LAB — BLOOD GAS, ARTERIAL
Acid-Base Excess: 19.8 mmol/L — ABNORMAL HIGH (ref 0.0–2.0)
Bicarbonate: 45.3 mmol/L — ABNORMAL HIGH (ref 20.0–28.0)
Drawn by: 11249
FIO2: 40
MECHVT: 420 mL
O2 Saturation: 98 %
PEEP: 5 cmH2O
Patient temperature: 97.9
RATE: 12 {breaths}/min
pCO2 arterial: 64.4 mmHg — ABNORMAL HIGH (ref 32.0–48.0)
pH, Arterial: 7.46 — ABNORMAL HIGH (ref 7.350–7.450)
pO2, Arterial: 95.9 mmHg (ref 83.0–108.0)

## 2017-02-26 LAB — PROCALCITONIN: Procalcitonin: 7.75 ng/mL

## 2017-02-26 LAB — CBC
HCT: 34.2 % — ABNORMAL LOW (ref 36.0–46.0)
HEMOGLOBIN: 11.1 g/dL — AB (ref 12.0–15.0)
MCH: 31.7 pg (ref 26.0–34.0)
MCHC: 32.5 g/dL (ref 30.0–36.0)
MCV: 97.7 fL (ref 78.0–100.0)
Platelets: 232 10*3/uL (ref 150–400)
RBC: 3.5 MIL/uL — ABNORMAL LOW (ref 3.87–5.11)
RDW: 13 % (ref 11.5–15.5)
WBC: 10.8 10*3/uL — ABNORMAL HIGH (ref 4.0–10.5)

## 2017-02-26 LAB — PHOSPHORUS: Phosphorus: 3.1 mg/dL (ref 2.5–4.6)

## 2017-02-26 MED ORDER — DEXTROSE 50 % IV SOLN
INTRAVENOUS | Status: AC
  Administered 2017-02-26: 25 mL
  Filled 2017-02-26: qty 50

## 2017-02-26 MED ORDER — DEXTROSE 50 % IV SOLN
INTRAVENOUS | Status: AC
Start: 2017-02-26 — End: 2017-02-26
  Administered 2017-02-26: 12.5 g
  Filled 2017-02-26: qty 50

## 2017-02-26 MED ORDER — MIDAZOLAM HCL 2 MG/2ML IJ SOLN
1.0000 mg | INTRAMUSCULAR | Status: DC | PRN
  Administered 2017-02-26 – 2017-02-27 (×7): 2 mg via INTRAVENOUS
  Administered 2017-02-27: 4 mg via INTRAVENOUS
  Administered 2017-02-27: 2 mg via INTRAVENOUS
  Administered 2017-02-27 (×2): 4 mg via INTRAVENOUS
  Administered 2017-02-27 – 2017-02-28 (×4): 2 mg via INTRAVENOUS
  Administered 2017-02-28: 4 mg via INTRAVENOUS
  Administered 2017-02-28 (×3): 2 mg via INTRAVENOUS
  Administered 2017-03-01: 4 mg via INTRAVENOUS
  Administered 2017-03-01: 2 mg via INTRAVENOUS
  Administered 2017-03-01: 4 mg via INTRAVENOUS
  Administered 2017-03-01: 2 mg via INTRAVENOUS
  Administered 2017-03-01 – 2017-03-03 (×10): 4 mg via INTRAVENOUS
  Administered 2017-03-03: 2 mg via INTRAVENOUS
  Filled 2017-02-26: qty 2
  Filled 2017-02-26: qty 4
  Filled 2017-02-26: qty 2
  Filled 2017-02-26: qty 4
  Filled 2017-02-26: qty 2
  Filled 2017-02-26: qty 4
  Filled 2017-02-26: qty 2
  Filled 2017-02-26 (×6): qty 4
  Filled 2017-02-26: qty 2
  Filled 2017-02-26 (×6): qty 4
  Filled 2017-02-26: qty 2
  Filled 2017-02-26: qty 4
  Filled 2017-02-26: qty 2
  Filled 2017-02-26: qty 4
  Filled 2017-02-26 (×3): qty 2
  Filled 2017-02-26: qty 4
  Filled 2017-02-26: qty 2
  Filled 2017-02-26: qty 4
  Filled 2017-02-26 (×2): qty 2

## 2017-02-26 MED ORDER — IPRATROPIUM-ALBUTEROL 0.5-2.5 (3) MG/3ML IN SOLN
3.0000 mL | Freq: Four times a day (QID) | RESPIRATORY_TRACT | Status: DC
  Administered 2017-02-26 – 2017-03-04 (×24): 3 mL via RESPIRATORY_TRACT
  Filled 2017-02-26 (×24): qty 3

## 2017-02-26 MED ORDER — POTASSIUM CHLORIDE 20 MEQ/15ML (10%) PO SOLN
40.0000 meq | Freq: Once | ORAL | Status: AC
  Administered 2017-02-26: 40 meq
  Filled 2017-02-26: qty 30

## 2017-02-26 MED ORDER — FUROSEMIDE 10 MG/ML IJ SOLN
20.0000 mg | Freq: Once | INTRAMUSCULAR | Status: AC
  Administered 2017-02-26: 20 mg via INTRAVENOUS
  Filled 2017-02-26: qty 2

## 2017-02-26 NOTE — Progress Notes (Signed)
PULMONARY  / CRITICAL CARE MEDICINE  Name: Megan [name] MRN: [id number] DOB: 03-13-1951    LOS: 62  REFERRING MD :  ED  CHIEF COMPLAINT:  Shortness of breath Brief SS:  Megan [name] is a 66 year old woman with COPD on home oxygen, hypertension hospitalized for acute on chronic respiratory failure. As she is intubated, history was collected from the chart.  She presented via EMS with shortness of breath and appeared cyanotic in the ED. She received Duonebs x 2, Solumedrol 125 mg IV, Mg 2g by EMS. She acknowledged chest pain, productive cough as well as full code by nodding yes. In the ED, She received adenosine 6 mg and 12 mg IV given concern for SVT though did not return to normal sinus rhythm so was started on diltiazem gtt. She did not improve on BiPAP and was intubated.    Lines / Drains: 4/22 PIV x 2  Cultures: 4/22 >> 4/23 RV > RSV/enterovirus+  Antibiotics: 4/22 Vanc > 4/25 4/22 Azithro > 4/25 4/22 Ceftriaxone x 1 4/23 Cefepime>>>  Tests / Events: 4/22 Intubated  4/23 Acute encephalopathy. Head CT w/o acute findings.    4/27: This morning, she appeared more comfortable and was able to open eyes on command. I spoke with her daughter yesterday that she has been difficult to wean off the vent and would thus favor comfort care though her improved mental status yesterday made her wonder if trach would be possible per nursing report.    SUBJECTIVE/OVERNIGHT/INTERVAL HX 4/28 - family meeting yesterday; likely heading towards trach but regoal 02/28/17.Agitaterd on precedex intermittently and needing versed oprn. Doing SBT but needing higher PSV  VITAL SIGNS: Temp:  [97.6 F (36.4 C)-98.3 F (36.8 C)] 97.9 F (36.6 C) (04/28 0727) Pulse Rate:  [48-140] 105 (04/28 0732) Resp:  [16-24] 18 (04/28 0732) BP: (97-142)/(54-79) 106/55 (04/28 0732) SpO2:  [98 %-100 %] 99 % (04/28 0737) FiO2 (%):  [40 %] 40 % (04/28 0737) Weight:  [54.7 kg (120 lb 9.6 oz)] 54.7 kg (120 lb 9.6  oz) (04/28 0500) HEMODYNAMICS:   VENTILATOR SETTINGS: Vent Mode: PSV;CPAP FiO2 (%):  [40 %] 40 % Set Rate:  [12 bmp] 12 bmp Vt Set:  [420 mL] 420 mL PEEP:  [5 cmH20] 5 cmH20 Pressure Support:  [12 cmH20] 12 cmH20 Plateau Pressure:  [18 cmH20-24 cmH20] 24 cmH20 INTAKE / OUTPUT: Intake/Output      04/27 0701 - 04/28 0700 04/28 0701 - 04/29 0700   I.V. (mL/kg) 399.2 (7.3)    NG/GT 950    IV Piggyback 745    Total Intake(mL/kg) 2094.2 (38.3)    Urine (mL/kg/hr) 2380 (1.8)    Stool 0 (0)    Total Output 2380     Net -285.8          Stool Occurrence 1 x     Physical Exam  Constitutional: She appears distressed.  HENT:  Head: Normocephalic and atraumatic.  Et tube +  Eyes: Conjunctivae are normal. Pupils are equal, round, and reactive to light. No scleral icterus.  Neck: No JVD present. No thyromegaly present.  Cardiovascular: Regular rhythm.   Tachycardic hr 149  Pulmonary/Chest: Breath sounds normal. No respiratory distress. She has no wheezes.  Doing sbt  Abdominal: Soft. She exhibits no distension.  Lymphadenopathy:    She has no cervical adenopathy.  Neurological:  Awake on precedex. Moves all 4s. Follows occ commands but also agitated  Skin: Skin is warm and dry. She is not diaphoretic.  Less generalized  edema   LABS: PULMONARY  Recent Labs Lab 02/20/17 0033 02/20/17 0139  02/20/17 0450 02/20/17 1112 02/23/17 1110 02/24/17 0655 02/26/17 0500  PHART 7.202*  --   < > 7.110* 7.246* 7.246* 7.211* 7.460*  PCO2ART 74.8*  --   < > 56.6* 46.9 60.3* 79.4* 64.4*  PO2ART 85.0  --   < > 82.0* 130.0* 97.1 95.0 95.9  HCO3 29.3*  --   < > 17.8* 20.6 25.4 30.6* 45.3*  TCO2 31 26  --  19 22  --   --   --   O2SAT 93.0  --   < > 90.0 98.0 97.4 96.5 98.0  < > = values in this interval not displayed.  CBC  Recent Labs Lab 02/24/17 0238 02/25/17 0632 02/26/17 0355  HGB 11.1* 11.1* 11.1*  HCT 35.0* 35.3* 34.2*  WBC 24.7* 15.9* 10.8*  PLT 346 299 232     COAGULATION No results for input(s): INR in the last 168 hours.  CARDIAC  No results for input(s): TROPONINI in the last 168 hours. No results for input(s): PROBNP in the last 168 hours.   CHEMISTRY  Recent Labs Lab 02/21/17 0413 02/21/17 1814 02/22/17 0529 02/23/17 0241 02/24/17 0238 02/25/17 0301 02/26/17 0355  NA 137  --  137 138 143 145 143  K 3.7  --  3.5 3.6 4.1 3.6 3.9  CL 106  --  110 109 107 99* 94*  CO2 21*  --  19* 24 30 37* 39*  GLUCOSE 91  --  238* 239* 136* 124* 123*  BUN 27*  --  48* 53* 52* 44* 34*  CREATININE 0.96  --  1.32* 1.08* 0.92 0.73 0.54  CALCIUM 7.7*  --  7.9* 8.2* 9.0 8.5* 8.2*  MG 1.8 1.7  --   --  2.0 1.9 2.0  PHOS 3.2 3.4  --   --  2.1* 2.4* 3.1   Estimated Creatinine Clearance: 58.8 mL/min (by C-G formula based on SCr of 0.54 mg/dL).   LIVER No results for input(s): AST, ALT, ALKPHOS, BILITOT, PROT, ALBUMIN, INR in the last 168 hours.   INFECTIOUS  Recent Labs Lab 02/20/17 1647  02/21/17 1448 02/21/17 1814  02/24/17 0839 02/25/17 0301 02/26/17 0355  LATICACIDVEN 3.4*  --  2.0* 0.9  --   --   --   --   PROCALCITON  --   < >  --   --   < > 31.89 19.16 7.75  < > = values in this interval not displayed.   ENDOCRINE CBG (last 3)   Recent Labs  02/26/17 0310 02/26/17 0342 02/26/17 0732  GLUCAP 58* 100* 179*         IMAGING x48h  - image(s) personally visualized  -   highlighted in bold Dg Chest Port 1 View  Result Date: 02/26/2017 CLINICAL DATA:  ET tube placement. EXAM: PORTABLE CHEST 1 VIEW COMPARISON:  02/25/2017. FINDINGS: ET tube approximately 3 cm above carina. Normal cardiomediastinal silhouette. BILATERAL effusions. No pneumothorax. Bibasilar opacities persist and are stable. IMPRESSION: Stable chest. Electronically Signed   By: Staci Righter M.D.   On: 02/26/2017 07:43   Dg Chest Port 1 View  Result Date: 02/25/2017 CLINICAL DATA:  Respiratory failure. EXAM: PORTABLE CHEST 1 VIEW COMPARISON:  02/24/2017  . FINDINGS: Endotracheal tube and NG tube in stable position. Heart size normal. COPD . Bibasilar pulmonary infiltrates again noted. Slight improvement. Bilateral small pleural effusions again noted. No pneumothorax. IMPRESSION: 1. Lines and tubes in stable  position. 2. Slight improvement of bibasilar pulmonary infiltrates. Small pleural effusions again noted. COPD. Electronically Signed   By: Marcello Moores  Register   On: 02/25/2017 07:11   '    ASSESSMENT / PLAN:  PULMONARY  ASSESSMENT: Acute on chronic hypercapnic respiratory failure: Multifactorial in the setting of COPD and rhinovirus/enterovirus PNA and Strep pneumo urine Ag. CHF listed on her problem list though EF 55-60% in October 2016 with non-ischemic disease on cardiac cath at that time. COPD: No PFTs on file.  02/26/2017 - doing SBT and much better than 48h earlier but no where close to extubation due to severe copd and agirtation  PLAN:   Continue mechanical ventilation while allowing for permissive hypercapnia. Continue bronchodilators and steroids. Repeat lasix 02/26/2017   CARDIOVASCULAR  ASSESSMENT:  Sinus tachycardia: HR trending 120s-130s with prn metoprolol.  PLAN:  Holding IV fluids Schedule metoprolol 5 mg every 6 hours   RENAL  ASSESSMENT:   Acute kidney injury with oliguria: BUN 44/Crt 0.7. FeNa 2% favors pre-renal etiology. Net -3.1L yesterday though still +8.8L since admission.  02/26/2017 - improved fluids balance +2L positive since admit  PLAN:   Follow BMET Repeat Lasix 02/26/2017   GASTROINTESTINAL  ASSESSMENT:   No active issues   PLAN:   Continue bowel regimen and tube feeds. Continue ppi  HEMATOLOGIC  ASSESSMENT:   Leukocytosis: CBC pending this morning Thrombocytosis: Reactive in the setting of inflammation.  PLAN:  Trend CBC Continue Lovenox for DVT prophylaxis  INFECTIOUS  ASSESSMENT:   Sepsis: In the setting of rhinovirus/enterovirus PNA and possibly Strep  pneumo. PCT downtrending.  02/26/2017 - afebrile and normal wbc  PLAN:   Continue cefepime for Strep pneumo coverage Follow procalcitonin as needed  ENDOCRINE  ASSESSMENT:   Hyperglycemia: Steroid induced though CBGs improved <200.   PLAN:    ICU glycemic protocol with 3 units standing order  NEUROLOGIC  ASSESSMENT:   Anxiety/depression: Xanax listed on home medication list and last filled 12/22/16 [0.25 mg x 60 tablets]. Acute encephalopathy: Multifactorial in the setting of hypercarbia and sepsis though improved this morning.  PLAN:   Continue Precedex gtt with Versed prn   GOALS  - see note from 4/27;/18      The patient is critically ill with multiple organ systems failure and requires high complexity decision making for assessment and support, frequent evaluation and titration of therapies, application of advanced monitoring technologies and extensive interpretation of multiple databases.   Critical Care Time devoted to patient care services described in this note is  30  Minutes. This time reflects time of care of this signee Dr Brand Males. This critical care time does not reflect procedure time, or teaching time or supervisory time of PA/NP/Med student/Med Resident etc but could involve care discussion time    Dr. Brand Males, M.D., [facility].C.P Pulmonary and Critical Care Medicine Staff Physician [facility] Pager: [phone], If no answer or between  15:00h - 7:00h: call [phone]  02/26/2017 8:13 AM

## 2017-02-26 NOTE — Progress Notes (Signed)
Hypoglycemic Event  CBG: 58  Treatment: 35m Dextrose  Symptoms: None  Follow-up CBG: Time: 0330 CBG Result: 100  Possible Reasons for Event: N/A  Comments/MD notified: N/A    Dakia Schifano A

## 2017-02-27 ENCOUNTER — Inpatient Hospital Stay (HOSPITAL_COMMUNITY): Payer: Medicare Other

## 2017-02-27 LAB — CBC WITH DIFFERENTIAL/PLATELET
BASOS PCT: 0 %
Basophils Absolute: 0 10*3/uL (ref 0.0–0.1)
Eosinophils Absolute: 0 10*3/uL (ref 0.0–0.7)
Eosinophils Relative: 0 %
HEMATOCRIT: 44.7 % (ref 36.0–46.0)
Hemoglobin: 14.1 g/dL (ref 12.0–15.0)
LYMPHS ABS: 1 10*3/uL (ref 0.7–4.0)
Lymphocytes Relative: 5 %
MCH: 31.9 pg (ref 26.0–34.0)
MCHC: 31.5 g/dL (ref 30.0–36.0)
MCV: 101.1 fL — ABNORMAL HIGH (ref 78.0–100.0)
MONOS PCT: 7 %
Monocytes Absolute: 1.3 10*3/uL — ABNORMAL HIGH (ref 0.1–1.0)
NEUTROS ABS: 16.2 10*3/uL — AB (ref 1.7–7.7)
NEUTROS PCT: 88 %
Platelets: 390 10*3/uL (ref 150–400)
RBC: 4.42 MIL/uL (ref 3.87–5.11)
RDW: 13.5 % (ref 11.5–15.5)
WBC: 18.5 10*3/uL — AB (ref 4.0–10.5)

## 2017-02-27 LAB — GLUCOSE, CAPILLARY
GLUCOSE-CAPILLARY: 72 mg/dL (ref 65–99)
GLUCOSE-CAPILLARY: 244 mg/dL — AB (ref 65–99)
GLUCOSE-CAPILLARY: 228 mg/dL — AB (ref 65–99)
Glucose-Capillary: 142 mg/dL — ABNORMAL HIGH (ref 65–99)
Glucose-Capillary: 146 mg/dL — ABNORMAL HIGH (ref 65–99)
Glucose-Capillary: 162 mg/dL — ABNORMAL HIGH (ref 65–99)

## 2017-02-27 LAB — BASIC METABOLIC PANEL
ANION GAP: 12 (ref 5–15)
BUN: 31 mg/dL — AB (ref 6–20)
CHLORIDE: 86 mmol/L — AB (ref 101–111)
CO2: 43 mmol/L — ABNORMAL HIGH (ref 22–32)
Calcium: 9.5 mg/dL (ref 8.9–10.3)
Creatinine, Ser: 0.67 mg/dL (ref 0.44–1.00)
GFR calc Af Amer: >60 mL/min (ref >60)
GLUCOSE: 93 mg/dL (ref 65–99)
POTASSIUM: 4.7 mmol/L (ref 3.5–5.1)
SODIUM: 141 mmol/L (ref 135–145)

## 2017-02-27 LAB — PHOSPHORUS: Phosphorus: 2.8 mg/dL (ref 2.5–4.6)

## 2017-02-27 LAB — MAGNESIUM: MAGNESIUM: 2.3 mg/dL (ref 1.7–2.4)

## 2017-02-27 MED ORDER — CHLORHEXIDINE GLUCONATE CLOTH 2 % EX PADS
6.0000 | MEDICATED_PAD | Freq: Every day | CUTANEOUS | Status: DC
  Administered 2017-02-28 – 2017-03-08 (×8): 6 via TOPICAL

## 2017-02-27 MED ORDER — FUROSEMIDE 10 MG/ML IJ SOLN
40.0000 mg | Freq: Two times a day (BID) | INTRAMUSCULAR | Status: DC
  Administered 2017-02-27 – 2017-02-28 (×2): 40 mg via INTRAVENOUS
  Filled 2017-02-27 (×4): qty 4

## 2017-02-27 MED ORDER — POTASSIUM CHLORIDE 20 MEQ/15ML (10%) PO SOLN
40.0000 meq | Freq: Every day | ORAL | Status: AC
  Administered 2017-02-27 – 2017-03-02 (×4): 40 meq
  Filled 2017-02-27 (×4): qty 30

## 2017-02-27 MED ORDER — SODIUM CHLORIDE 0.9% FLUSH
10.0000 mL | Freq: Two times a day (BID) | INTRAVENOUS | Status: DC
Start: 2017-02-27 — End: 2017-03-08
  Administered 2017-02-27: 20 mL
  Administered 2017-02-28 – 2017-03-08 (×12): 10 mL

## 2017-02-27 MED ORDER — SODIUM CHLORIDE 0.9 % IV BOLUS (SEPSIS): 1000.0000 mL | Freq: Once | INTRAVENOUS | Status: DC

## 2017-02-27 MED ORDER — SODIUM CHLORIDE 0.9% FLUSH
10.0000 mL | INTRAVENOUS | Status: DC | PRN
  Administered 2017-03-04 – 2017-03-08 (×5): 10 mL
  Filled 2017-02-27 (×5): qty 40

## 2017-02-27 MED ORDER — METHYLPREDNISOLONE SODIUM SUCC 40 MG IJ SOLR
40.0000 mg | Freq: Every day | INTRAMUSCULAR | Status: DC
Start: 2017-02-27 — End: 2017-03-01
  Administered 2017-02-27 – 2017-02-28 (×2): 40 mg via INTRAVENOUS
  Filled 2017-02-27 (×3): qty 1

## 2017-02-27 NOTE — Progress Notes (Addendum)
Peripherally Inserted Central Catheter/Midline Placement  The IV Nurse has discussed with the patient and/or persons authorized to consent for the patient, the purpose of this procedure and the potential benefits and risks involved with this procedure.  The benefits include less needle sticks, lab draws from the catheter, and the patient may be discharged home with the catheter. Risks include, but not limited to, infection, bleeding, blood clot (thrombus formation), and puncture of an artery; nerve damage and irregular heartbeat and possibility to perform a PICC exchange if needed/ordered by physician.  Alternatives to this procedure were also discussed.  Bard Power PICC patient education guide, fact sheet on infection prevention and patient information card has been provided to patient /or left at bedside. Telephone consent obtained from daughter.All questions answered. Patient nodded agreement and gave implied consent.  PICC/Midline Placement Documentation  PICC Double Lumen 02/27/17 PICC Right Brachial 33 cm 0 cm (Active)  Indication for Insertion or Continuance of Line Poor Vasculature-patient has had multiple peripheral attempts or PIVs lasting less than 24 hours 02/27/2017  3:31 PM  Exposed Catheter (cm) 0 cm 02/27/2017  3:31 PM  Site Assessment Clean;Dry;Intact 02/27/2017  3:31 PM  Lumen #1 Status Flushed;Saline locked;Blood return noted 02/27/2017  3:31 PM  Lumen #2 Status Flushed;Saline locked;Blood return noted 02/27/2017  3:31 PM  Dressing Type Transparent;Securing device 02/27/2017  3:31 PM  Dressing Status Clean;Intact;Antimicrobial disc in place;Dry 02/27/2017  3:31 PM  Line Care Connections checked and tightened 02/27/2017  3:31 PM  Line Adjustment (NICU/IV Team Only) No 02/27/2017  3:31 PM  Dressing Intervention New dressing 02/27/2017  3:31 PM  Dressing Change Due 03/06/17 02/27/2017  3:31 PM       Torren Maffeo, Weldon Inches 02/27/2017, 3:35 PM

## 2017-02-27 NOTE — Progress Notes (Signed)
PULMONARY  / CRITICAL CARE MEDICINE  Name: Megan [name] MRN: [id number] DOB: [birth year]    LOS: 15  REFERRING MD :  ED  CHIEF COMPLAINT:  Shortness of breath Brief SS:  Megan [name] is a 66 year old woman with COPD on home oxygen, hypertension hospitalized for acute on chronic respiratory failure. As she is intubated, history was collected from the chart.  She presented via EMS with shortness of breath and appeared cyanotic in the ED. She received Duonebs x 2, Solumedrol 125 mg IV, Mg 2g by EMS. She acknowledged chest pain, productive cough as well as full code by nodding yes. In the ED, She received adenosine 6 mg and 12 mg IV given concern for SVT though did not return to normal sinus rhythm so was started on diltiazem gtt. She did not improve on BiPAP and was intubated.    Lines / Drains: 4/22 PIV x 2  Cultures: 4/22 >> 4/23 RV > RSV/enterovirus+  Antibiotics: 4/22 Vanc > 4/25 4/22 Azithro > 4/25 4/22 Ceftriaxone x 1 4/23 Cefepime>>>  Tests / Events: 4/22 Intubated  4/23 Acute encephalopathy. Head CT w/o acute findings.   SUBJECTIVE/OVERNIGHT/INTERVAL HX This morning, she is alert and awake off Precedex. PS 12 with PEEP 5.   VITAL SIGNS: Temp:  [97 F (36.1 C)-99.1 F (37.3 C)] 98.6 F (37 C) (04/29 0735) Pulse Rate:  [85-134] 110 (04/29 0600) Resp:  [10-30] 15 (04/29 0600) BP: (92-179)/(46-97) 179/97 (04/29 0600) SpO2:  [96 %-100 %] 100 % (04/29 0729) FiO2 (%):  [40 %] 40 % (04/29 0729) Weight:  [49.9 kg (110 lb 1.6 oz)] 49.9 kg (110 lb 1.6 oz) (04/29 0500) HEMODYNAMICS:   VENTILATOR SETTINGS: Vent Mode: PRVC FiO2 (%):  [40 %] 40 % Set Rate:  [12 bmp] 12 bmp Vt Set:  [420 mL] 420 mL PEEP:  [5 cmH20] 5 cmH20 Plateau Pressure:  [18 cmH20-25 cmH20] 24 cmH20 INTAKE / OUTPUT: Intake/Output      04/28 0701 - 04/29 0700 04/29 0701 - 04/30 0700   I.V. (mL/kg) 180 (3.6)    NG/GT 1190    IV Piggyback 150    Total Intake(mL/kg) 1520 (30.5)    Urine (mL/kg/hr)  3250 (2.7)    Emesis/NG output 0 (0)    Stool 1 (0)    Total Output 3251     Net -1731          Stool Occurrence 2 x     Physical Exam  Constitutional: No distress.  HENT:  Head: Normocephalic and atraumatic.  ETT in place  Eyes: Conjunctivae are normal. Pupils are equal, round, and reactive to light. No scleral icterus.  Neck: No JVD present. No thyromegaly present.  Cardiovascular: Regular rhythm.   Tachycardic  Pulmonary/Chest: Breath sounds normal. No respiratory distress. She has no wheezes.  Abdominal: Soft. She exhibits no distension.  Lymphadenopathy:    She has no cervical adenopathy.  Neurological: She is alert.  Following commands off Precedex this morning.  Skin: Skin is warm and dry. She is not diaphoretic.   LABS: PULMONARY  Recent Labs Lab 02/20/17 1112 02/23/17 1110 02/24/17 0655 02/26/17 0500  PHART 7.246* 7.246* 7.211* 7.460*  PCO2ART 46.9 60.3* 79.4* 64.4*  PO2ART 130.0* 97.1 95.0 95.9  HCO3 20.6 25.4 30.6* 45.3*  TCO2 22  --   --   --   O2SAT 98.0 97.4 96.5 98.0    CBC  Recent Labs Lab 02/25/17 0632 02/26/17 0355 02/27/17 0020  HGB 11.1* 11.1* 14.1  HCT 35.3*  34.2* 44.7  WBC 15.9* 10.8* 18.5*  PLT 299 232 390    COAGULATION No results for input(s): INR in the last 168 hours.  CARDIAC  No results for input(s): TROPONINI in the last 168 hours. No results for input(s): PROBNP in the last 168 hours.   CHEMISTRY  Recent Labs Lab 02/21/17 1814  02/23/17 0241 02/24/17 0238 02/25/17 0301 02/26/17 0355 02/27/17 0020  NA  --   < > 138 143 145 143 141  K  --   < > 3.6 4.1 3.6 3.9 4.7  CL  --   < > 109 107 99* 94* 86*  CO2  --   < > 24 30 37* 39* 43*  GLUCOSE  --   < > 239* 136* 124* 123* 93  BUN  --   < > 53* 52* 44* 34* 31*  CREATININE  --   < > 1.08* 0.92 0.73 0.54 0.67  CALCIUM  --   < > 8.2* 9.0 8.5* 8.2* 9.5  MG 1.7  --   --  2.0 1.9 2.0 2.3  PHOS 3.4  --   --  2.1* 2.4* 3.1 2.8  < > = values in this interval not  displayed. Estimated Creatinine Clearance: 56 mL/min (by C-G formula based on SCr of 0.67 mg/dL).   LIVER No results for input(s): AST, ALT, ALKPHOS, BILITOT, PROT, ALBUMIN, INR in the last 168 hours.   INFECTIOUS  Recent Labs Lab 02/20/17 1647  02/21/17 1448 02/21/17 1814  02/24/17 0839 02/25/17 0301 02/26/17 0355  LATICACIDVEN 3.4*  --  2.0* 0.9  --   --   --   --   PROCALCITON  --   < >  --   --   < > 31.89 19.16 7.75  < > = values in this interval not displayed.   ENDOCRINE CBG (last 3)   Recent Labs  02/26/17 2334 02/27/17 0421 02/27/17 0733  GLUCAP 65 142* 162*         IMAGING x48h  - image(s) personally visualized  -   highlighted in bold Dg Chest Port 1 View  Result Date: 02/27/2017 CLINICAL DATA:  Hypoxia EXAM: PORTABLE CHEST 1 VIEW COMPARISON:  February 26, 2017 FINDINGS: In endotracheal tube tip is 2.8 cm above the carina. Nasogastric tube tip and side port are below the diaphragm. There is no evident pneumothorax. There is a small left pleural effusion with mild left base atelectasis. The lungs elsewhere clear. Heart size and pulmonary vascularity are normal. No adenopathy. There is aortic atherosclerosis. No bone lesions. IMPRESSION: Tube positions as described without evident pneumothorax. Small left pleural effusion. No edema or consolidation. There is aortic atherosclerosis. Electronically Signed   By: Lowella Grip III M.D.   On: 02/27/2017 07:30   Dg Chest Port 1 View  Result Date: 02/26/2017 CLINICAL DATA:  ET tube placement. EXAM: PORTABLE CHEST 1 VIEW COMPARISON:  02/25/2017. FINDINGS: ET tube approximately 3 cm above carina. Normal cardiomediastinal silhouette. BILATERAL effusions. No pneumothorax. Bibasilar opacities persist and are stable. IMPRESSION: Stable chest. Electronically Signed   By: Staci Righter M.D.   On: 02/26/2017 07:43   '    ASSESSMENT / PLAN:  PULMONARY  ASSESSMENT: Acute on chronic hypercapnic respiratory failure:  Multifactorial in the setting of COPD and rhinovirus/enterovirus PNA and Strep pneumo urine Ag. CHF listed on her problem list though EF 55-60% in October 2016 with non-ischemic disease on cardiac cath at that time. COPD: No PFTs on file.  PLAN:  Continue mechanical ventilation while allowing for permissive hypercapnia. Continue bronchodilators and steroids though will taper to Solumedrol 40 mg twice daily today Give Lasix 40 mg IV twice daily   CARDIOVASCULAR  ASSESSMENT:  Sinus tachycardia: HR trending 120s-130s with prn metoprolol.  PLAN:  Holding IV fluids Continue metoprolol 5 mg every 6 hours as needed   RENAL  ASSESSMENT:   Acute kidney injury with oliguria: BUN 44/Crt 0.7. FeNa 2% favors pre-renal etiology. Net -1.7L yesterday though still +7.2L since admission.  PLAN:   Follow BMET Lasix as noted above   GASTROINTESTINAL  ASSESSMENT:   No active issues   PLAN:   Continue bowel regimen and tube feeds. Continue ppi   HEMATOLOGIC  ASSESSMENT:   Leukocytosis: 18.5 this morning, up from 10.8 yesterday. Multifactorial from inflammation and steroids. Thrombocytosis: Resolved.  PLAN:  Trend CBC Continue Lovenox for DVT prophylaxis   INFECTIOUS  ASSESSMENT:   Sepsis: In the setting of rhinovirus/enterovirus PNA and possibly Strep pneumo. PCT downtrending.  PLAN:   Complete 7-day course of cefepime for Strep pneumo coverage Follow procalcitonin as needed   ENDOCRINE  ASSESSMENT:   Hyperglycemia: Steroid induced though CBGs improved <200.   PLAN:    ICU glycemic protocol with 3 units standing order   NEUROLOGIC  ASSESSMENT:   Anxiety/depression: Xanax listed on home medication list and last filled 12/22/16 [0.25 mg x 60 tablets]. Acute encephalopathy: Multifactorial in the setting of hypercarbia and sepsis though improved this morning.  PLAN:   Continue Precedex gtt with Versed prn   Charlott Rakes, PGY3 Internal  Medicine Pager: [phone]  02/27/2017 7:53 AM

## 2017-02-27 NOTE — Progress Notes (Signed)
I attempted to reach [name] about scheduling a family meeting tomorrow at 81 AM or 130PM though was unable to leave a voicemail message on account of a full mailbox. I will ask the unit RN to try reaching her later and ask.

## 2017-02-28 ENCOUNTER — Inpatient Hospital Stay (HOSPITAL_COMMUNITY): Payer: Medicare Other

## 2017-02-28 DIAGNOSIS — M7989 Other specified soft tissue disorders: Secondary | ICD-10-CM

## 2017-02-28 DIAGNOSIS — J439 Emphysema, unspecified: Secondary | ICD-10-CM

## 2017-02-28 DIAGNOSIS — N179 Acute kidney failure, unspecified: Secondary | ICD-10-CM

## 2017-02-28 LAB — CBC WITH DIFFERENTIAL/PLATELET
BASOS PCT: 0 %
Basophils Absolute: 0 10*3/uL (ref 0.0–0.1)
EOS ABS: 0.1 10*3/uL (ref 0.0–0.7)
EOS PCT: 0 %
HCT: 36.2 % (ref 36.0–46.0)
Hemoglobin: 11.2 g/dL — ABNORMAL LOW (ref 12.0–15.0)
Lymphocytes Relative: 8 %
Lymphs Abs: 1.5 10*3/uL (ref 0.7–4.0)
MCH: 30.9 pg (ref 26.0–34.0)
MCHC: 30.9 g/dL (ref 30.0–36.0)
MCV: 100 fL (ref 78.0–100.0)
Monocytes Absolute: 1.8 10*3/uL — ABNORMAL HIGH (ref 0.1–1.0)
Monocytes Relative: 10 %
Neutro Abs: 15.2 10*3/uL — ABNORMAL HIGH (ref 1.7–7.7)
Neutrophils Relative %: 82 %
PLATELETS: 357 10*3/uL (ref 150–400)
RBC: 3.62 MIL/uL — ABNORMAL LOW (ref 3.87–5.11)
RDW: 13.2 % (ref 11.5–15.5)
WBC: 18.5 10*3/uL — AB (ref 4.0–10.5)

## 2017-02-28 LAB — BASIC METABOLIC PANEL
Anion gap: 6 (ref 5–15)
BUN: 34 mg/dL — ABNORMAL HIGH (ref 6–20)
CALCIUM: 8.5 mg/dL — AB (ref 8.9–10.3)
CO2: 48 mmol/L — ABNORMAL HIGH (ref 22–32)
CREATININE: 0.64 mg/dL (ref 0.44–1.00)
Chloride: 87 mmol/L — ABNORMAL LOW (ref 101–111)
GFR calc non Af Amer: >60 mL/min (ref >60)
Glucose, Bld: 103 mg/dL — ABNORMAL HIGH (ref 65–99)
Potassium: 3.8 mmol/L (ref 3.5–5.1)
SODIUM: 141 mmol/L (ref 135–145)

## 2017-02-28 LAB — GLUCOSE, CAPILLARY
GLUCOSE-CAPILLARY: 86 mg/dL (ref 65–99)
GLUCOSE-CAPILLARY: 147 mg/dL — AB (ref 65–99)
GLUCOSE-CAPILLARY: 124 mg/dL — AB (ref 65–99)
GLUCOSE-CAPILLARY: 62 mg/dL — AB (ref 65–99)
GLUCOSE-CAPILLARY: 93 mg/dL (ref 65–99)
Glucose-Capillary: 161 mg/dL — ABNORMAL HIGH (ref 65–99)
Glucose-Capillary: 96 mg/dL (ref 65–99)

## 2017-02-28 LAB — MAGNESIUM: MAGNESIUM: 2.1 mg/dL (ref 1.7–2.4)

## 2017-02-28 LAB — PHOSPHORUS: PHOSPHORUS: 3.5 mg/dL (ref 2.5–4.6)

## 2017-02-28 MED ORDER — FUROSEMIDE 10 MG/ML IJ SOLN
20.0000 mg | Freq: Two times a day (BID) | INTRAMUSCULAR | Status: DC
  Administered 2017-02-28 – 2017-03-01 (×2): 20 mg via INTRAVENOUS
  Filled 2017-02-28: qty 2

## 2017-02-28 MED ORDER — BUDESONIDE 0.5 MG/2ML IN SUSP
0.5000 mg | Freq: Two times a day (BID) | RESPIRATORY_TRACT | Status: DC
  Administered 2017-02-28 – 2017-03-08 (×16): 0.5 mg via RESPIRATORY_TRACT
  Filled 2017-02-28 (×17): qty 2

## 2017-02-28 MED ORDER — ACETAMINOPHEN 160 MG/5ML PO SOLN
650.0000 mg | Freq: Four times a day (QID) | ORAL | Status: DC | PRN
  Administered 2017-02-28 – 2017-03-05 (×3): 650 mg via ORAL
  Filled 2017-02-28 (×3): qty 20.3

## 2017-02-28 MED ORDER — PRO-STAT SUGAR FREE PO LIQD
30.0000 mL | Freq: Every day | ORAL | Status: DC
  Administered 2017-02-28 – 2017-03-08 (×8): 30 mL
  Filled 2017-02-28 (×9): qty 30

## 2017-02-28 MED ORDER — VITAL AF 1.2 CAL PO LIQD
1000.0000 mL | ORAL | Status: DC
  Administered 2017-02-28 – 2017-03-06 (×6): 1000 mL
  Filled 2017-02-28 (×6): qty 1000

## 2017-02-28 MED ORDER — DEXTROSE 50 % IV SOLN
INTRAVENOUS | Status: AC
  Administered 2017-02-28: 50 mL
  Filled 2017-02-28: qty 50

## 2017-02-28 NOTE — Progress Notes (Signed)
   [facility]  > trache planned for Wed or Thurs.   Monica Becton, MD 02/28/2017, 6:25 PM [facility] Pager [phone] After 3 pm or if no answer, call [phone]

## 2017-02-28 NOTE — Progress Notes (Addendum)
Dr. Corrie Dandy and I met with Megan [name] and Megan [name] by phone. Myriam [name] phoned Megan [name] who was available by phone to hear our conversation.  We reviewed our current clinical course in the way of the pneumonia and Megan [name] difficulty weaning off the vent. We discussed one-way extubation versus tracheostomy. Megan [name] ultimate goal is to be at home with Megan [name] family, specifically Megan [name] grandchildren. Thus, she elects for a tracheostomy with the hope that she will be able to wean off and return home.   In light of Megan [name] clinical progress, we also discussed code status, and she acknowledged wanting everything done, including ACLS/CPR, cardioversion, and mechanical ventilation.   We will thus rescind Megan [name] current DNR/DNI code status and plan for tracheostomy tomorrow.

## 2017-02-28 NOTE — Progress Notes (Signed)
Initial Nutrition Assessment  DOCUMENTATION CODES:   Non-severe (moderate) malnutrition in context of chronic illness  INTERVENTION:   To meet re-estimated needs change TF goal rate:  Vital AF 1.2 at 40 ml/h (960 ml per day)  Pro-stat 30 ml once daily  Provides 1252 kcal, 87 gm protein, 779 ml free water daily  After tracheostomy, place Cortrak or NGT for tube feedings.  NUTRITION DIAGNOSIS:   Malnutrition (moderate) related to chronic illness (COPD) as evidenced by mild depletion of body fat, moderate depletion of body fat, mild depletion of muscle mass, moderate depletions of muscle mass.  Ongoing  GOAL:   Patient will meet greater than or equal to 90% of their needs  Met with TF  MONITOR:   Vent status, TF tolerance, Labs, I & O's  ASSESSMENT:   66 year old woman with COPD on home oxygen, hypertension hospitalized for acute on chronic respiratory failure.   Discussed patient in ICU rounds and with RN today. S/P family meeting today. Plans for tracheostomy placement tomorrow. Patient is a full code. Currently receiving Vital AF 1.2 at 50 ml/h to provide 1440 kcal, 90 gm protein, 973 ml free water daily. Tolerating TF well per RN. Patient remains intubated on ventilator support MV: 9 L/min Temp (24hrs), Avg:97.9 F (36.6 C), Min:97.4 F (36.3 C), Max:98.4 F (36.9 C)  Labs reviewed. Medications reviewed and include Lasix and KCl.  Diet Order:   NPO  Skin:  Reviewed, no issues  Last BM:  4/30  Height:   Ht Readings from Last 1 Encounters:  02/20/17 _0  (1.6 m)    Weight:   Wt Readings from Last 1 Encounters:  02/28/17 104 lb 14.4 oz (47.6 kg)    Ideal Body Weight:  52.3 kg  BMI:  Body mass index is 18.58 kg/m.  Estimated Nutritional Needs:   Kcal:  1200  Protein:  75-90 gm  Fluid:  1.4 L  EDUCATION NEEDS:   No education needs identified at this time  Molli Barrows, [place], [place], [place] Pager [phone] After Hours Pager [phone]

## 2017-02-28 NOTE — Progress Notes (Signed)
PULMONARY  / CRITICAL CARE MEDICINE  Name: Megan [name] MRN: [id number] DOB: Nov 13, 1950    LOS: 50  REFERRING MD :  ED  CHIEF COMPLAINT:  Shortness of breath Brief SS:  Megan [name] is a 66 year old woman with COPD on home oxygen, hypertension hospitalized for acute on chronic respiratory failure. As she is intubated, history was collected from the chart.  She presented via EMS with shortness of breath and appeared cyanotic in the ED. She received Duonebs x 2, Solumedrol 125 mg IV, Mg 2g by EMS. She acknowledged chest pain, productive cough as well as full code by nodding yes. In the ED, She received adenosine 6 mg and 12 mg IV given concern for SVT though did not return to normal sinus rhythm so was started on diltiazem gtt. She did not improve on BiPAP and was intubated.    Lines / Drains: 4/22 PIV x 2 4/29 R PICC line  Cultures: 4/22 >> 4/23 RV > RSV/enterovirus+  Antibiotics: 4/22 Vanc > 4/25 4/22 Azithro > 4/25 4/22 Ceftriaxone x 1 4/23 Cefepime > 4/29  Tests / Events: 4/22 Intubated  4/23 Acute encephalopathy. Head CT w/o acute findings.   SUBJECTIVE/OVERNIGHT/INTERVAL HX This morning, she is alert and awake. She expresses eagerness to be extubated. R PICC line placed yesterday due to poor IV access.    VITAL SIGNS: Temp:  [97.4 F (36.3 C)-98.4 F (36.9 C)] 98.4 F (36.9 C) (04/30 0717) Pulse Rate:  [94-130] 112 (04/30 0741) Resp:  [17-23] 22 (04/30 0741) BP: (114-175)/(60-107) 146/78 (04/30 0741) SpO2:  [97 %-100 %] 99 % (04/30 0744) FiO2 (%):  [40 %] 40 % (04/30 0744) Weight:  [47.6 kg (104 lb 14.4 oz)] 47.6 kg (104 lb 14.4 oz) (04/30 0249) HEMODYNAMICS:   VENTILATOR SETTINGS: Vent Mode: PSV;CPAP FiO2 (%):  [40 %] 40 % Set Rate:  [12 bmp] 12 bmp Vt Set:  [420 mL] 420 mL PEEP:  [5 cmH20] 5 cmH20 Pressure Support:  [10 cmH20-12 cmH20] 12 cmH20 Plateau Pressure:  [15 cmH20-22 cmH20] 22 cmH20 INTAKE / OUTPUT: Intake/Output      04/29 0701 - 04/30  0700 04/30 0701 - 05/01 0700   I.V. (mL/kg) 320 (6.7)    NG/GT 1260    IV Piggyback 100    Total Intake(mL/kg) 1680 (35.3)    Urine (mL/kg/hr) 3550 (3.1)    Emesis/NG output 0 (0)    Stool 0 (0)    Total Output 3550     Net -1870          Stool Occurrence 1 x     Physical Exam  Constitutional: No distress.  HENT:  Head: Normocephalic and atraumatic.  ETT in place  Eyes: Conjunctivae are normal. Pupils are equal, round, and reactive to light. No scleral icterus.  Neck: Normal range of motion.  Cardiovascular: Regular rhythm and intact distal pulses.   Tachycardic  Pulmonary/Chest: Breath sounds normal. No respiratory distress. She has no wheezes.  Abdominal: Soft. She exhibits no distension.  Neurological: She is alert.  Following commands and nodding appropriately to questions  Skin: Skin is warm and dry. She is not diaphoretic.  LUE less swollen today than yesterday.   LABS: PULMONARY  Recent Labs Lab 02/23/17 1110 02/24/17 0655 02/26/17 0500  PHART 7.246* 7.211* 7.460*  PCO2ART 60.3* 79.4* 64.4*  PO2ART 97.1 95.0 95.9  HCO3 25.4 30.6* 45.3*  O2SAT 97.4 96.5 98.0    CBC  Recent Labs Lab 02/26/17 0355 02/27/17 0020 02/28/17 0420  HGB 11.1*  14.1 11.2*  HCT 34.2* 44.7 36.2  WBC 10.8* 18.5* 18.5*  PLT 232 390 357    COAGULATION No results for input(s): INR in the last 168 hours.  CARDIAC  No results for input(s): TROPONINI in the last 168 hours. No results for input(s): PROBNP in the last 168 hours.   CHEMISTRY  Recent Labs Lab 02/24/17 0238 02/25/17 0301 02/26/17 0355 02/27/17 0020 02/28/17 0420  NA 143 145 143 141 141  K 4.1 3.6 3.9 4.7 3.8  CL 107 99* 94* 86* 87*  CO2 30 37* 39* 43* 48*  GLUCOSE 136* 124* 123* 93 103*  BUN 52* 44* 34* 31* 34*  CREATININE 0.92 0.73 0.54 0.67 0.64  CALCIUM 9.0 8.5* 8.2* 9.5 8.5*  MG 2.0 1.9 2.0 2.3 2.1  PHOS 2.1* 2.4* 3.1 2.8 3.5   Estimated Creatinine Clearance: 53.4 mL/min (by C-G formula based on  SCr of 0.64 mg/dL).   LIVER No results for input(s): AST, ALT, ALKPHOS, BILITOT, PROT, ALBUMIN, INR in the last 168 hours.   INFECTIOUS  Recent Labs Lab 02/21/17 1448 02/21/17 1814  02/24/17 0839 02/25/17 0301 02/26/17 0355  LATICACIDVEN 2.0* 0.9  --   --   --   --   PROCALCITON  --   --   < > 31.89 19.16 7.75  < > = values in this interval not displayed.   ENDOCRINE CBG (last 3)   Recent Labs  02/27/17 2334 02/28/17 0425 02/28/17 0713  GLUCAP 146* 86 161*         IMAGING x48h  - image(s) personally visualized  -   highlighted in bold Dg Chest Port 1 View  Result Date: 02/28/2017 CLINICAL DATA:  Acute respiratory failure, sepsis, history of COPD, EXAM: PORTABLE CHEST 1 VIEW COMPARISON:  Portable chest x-ray of February 27, 2017 FINDINGS: The lungs remain hyperinflated. There is no alveolar infiltrate. There is a tiny left pleural effusion which is stable. The heart is small but stable. The pulmonary vascularity is not engorged. The endotracheal tube tip lies 4.2 cm above the carina. The esophagogastric tubes proximal port lies above the GE junction and advancement is needed. The PICC line tip projects over the midportion of the SVC. IMPRESSION: Fairly stable appearance of the chest consistent with known COPD. Stable small right pleural effusion. Minimal subsegmental atelectasis at both bases. No alveolar pneumonia nor pulmonary edema. Advancement of the nasogastric tube by 10 cm is recommended to assure that the proximal port remains below the GE junction. Electronically Signed   By: David  Martinique M.D.   On: 02/28/2017 07:04   Dg Chest Port 1 View  Result Date: 02/27/2017 CLINICAL DATA:  Hypoxia EXAM: PORTABLE CHEST 1 VIEW COMPARISON:  February 26, 2017 FINDINGS: In endotracheal tube tip is 2.8 cm above the carina. Nasogastric tube tip and side port are below the diaphragm. There is no evident pneumothorax. There is a small left pleural effusion with mild left base atelectasis.  The lungs elsewhere clear. Heart size and pulmonary vascularity are normal. No adenopathy. There is aortic atherosclerosis. No bone lesions. IMPRESSION: Tube positions as described without evident pneumothorax. Small left pleural effusion. No edema or consolidation. There is aortic atherosclerosis. Electronically Signed   By: Lowella Grip III M.D.   On: 02/27/2017 07:30   '    ASSESSMENT / PLAN:  PULMONARY  ASSESSMENT: Acute on chronic hypercapnic respiratory failure: Multifactorial in the setting of COPD and rhinovirus/enterovirus PNA and Strep pneumo urine Ag. CHF listed on her problem list  though EF 55-60% in October 2016 with non-ischemic disease on cardiac cath at that time. COPD: No PFTs on file.  PLAN:   Continue mechanical ventilation while allowing for permissive hypercapnia. Continue bronchodilators and steroids though will taper to Solumedrol 40 mg twice daily today Give Lasix 40 mg IV twice daily   CARDIOVASCULAR  ASSESSMENT:  Sinus tachycardia: HR trending 100-110s with prn metoprolol.  PLAN:  Holding IV fluids Continue metoprolol 5 mg every 6 hours as needed   RENAL  ASSESSMENT:   Acute kidney injury with oliguria: BUN 34/Crt 0.6. FeNa 2% favors pre-renal etiology. Net -1.6L yesterday though still +5.6L since admission.  PLAN:   Follow BMET Lasix as noted above   GASTROINTESTINAL  ASSESSMENT:   No active issues   PLAN:   Continue bowel regimen and tube feeds. Continue ppi   HEMATOLOGIC  ASSESSMENT:   Leukocytosis: 18.5 this morning, stable from yesterday. Multifactorial from inflammation and steroids. Thrombocytosis: Resolved. Left arm DVT?: Swelling improved from yesterday.  PLAN:  Trend CBC Continue Lovenox for DVT prophylaxis F/U LUE Doppler US   INFECTIOUS  ASSESSMENT:   Sepsis: In the setting of rhinovirus/enterovirus PNA and possibly Strep pneumo. PCT downtrending.  PLAN:   Completed 7-day course of cefepime  for Strep pneumo coverage   ENDOCRINE  ASSESSMENT:   Hyperglycemia: Steroid induced though CBGs improved <200.   PLAN:    ICU glycemic protocol with 3 units standing order   NEUROLOGIC  ASSESSMENT:   Anxiety/depression: Xanax listed on home medication list and last filled 12/22/16 [0.25 mg x 60 tablets]. Acute encephalopathy: Multifactorial in the setting of hypercarbia and sepsis though improved this morning.  PLAN:   Continue Versed prn though consider adding oral long-acting benzo for anxiety   Charlott Rakes, PGY3 Internal Medicine Pager: [phone]  02/28/2017 7:50 AM    ATTENDING NOTE / ATTESTATION NOTE :   I have discussed the case with the resident/APP  Dr. Posey Pronto.   I agree with the resident/APP's  history, physical examination, assessment, and plans.    I have edited the above note and modified it according to our agreed history, physical examination, assessment and plan.   Briefly, Ms. Trinka is a 66 year old woman with COPD on home oxygen, hypertension hospitalized for acute on chronic respiratory failure. As she is intubated, history was collected from the chart.  She presented via EMS with shortness of breath and appeared cyanotic in the ED. She received Duonebs x 2, Solumedrol 125 mg IV, Mg 2g by EMS. She acknowledged chest pain, productive cough as well as full code by nodding yes. In the ED, She received adenosine 6 mg and 12 mg IV given concern for SVT though did not return to normal sinus rhythm so was started on diltiazem gtt. She did not improve on BiPAP and was intubated.  She has failed weaning the last week. Has anxiety issues.    Vitals:  Vitals:   02/28/17 0800 02/28/17 0900 02/28/17 0911 02/28/17 1000  BP: (!) 169/92 112/63  115/75  Pulse: (!) 147 (!) 133  (!) 136  Resp: (!) 21 (!) 25  (!) 30  Temp:      TempSrc:      SpO2: 98% 97% 97% 99%  Weight:      Height:        Constitutional/General: chronically ill,  intubated,  sedated, in mild  Distress. cachectic  Body mass index is 18.58 kg/m. Wt Readings from Last 3 Encounters:  02/28/17 47.6 kg (104 lb  14.4 oz)  01/24/17 42.9 kg (94 lb 9.6 oz)  01/11/17 43.6 kg (96 lb 1.6 oz)    HEENT: PERLA, anicteric sclerae. (-) Oral thrush. Intubated, ETT in place  Neck: No masses. Midline trachea. No JVD, (-) LAD. (-) bruits appreciated.  Respiratory/Chest: Grossly normal chest. (-) deformity. (-) Accessory muscle use.  Symmetric expansion. Diminished BS on both lower lung zones. (-)  crackles, rhonchi Occasional wheezing in upper lung zones (-) egophony  Cardiovascular: Regular rate and  rhythm, heart sounds normal, no murmur or gallops,  Trace peripheral edema  Gastrointestinal:  Normal bowel sounds. Soft, non-tender. No hepatosplenomegaly.  (-) masses.   Musculoskeletal:  Normal muscle tone.   Extremities: Grossly normal. (-) clubbing, cyanosis.  (-) edema  Skin: (-) rash,lesions seen.   Neurological/Psychiatric : sedated, intubated. CN grossly intact. (-) lateralizing signs.     CBC Recent Labs     02/26/17  0355  02/27/17  0020  02/28/17  0420  WBC  10.8*  18.5*  18.5*  HGB  11.1*  14.1  11.2*  HCT  34.2*  44.7  36.2  PLT  232  390  357    Coag's No results for input(s): APTT, INR in the last 72 hours.  BMET Recent Labs     02/26/17  0355  02/27/17  0020  02/28/17  0420  NA  143  141  141  K  3.9  4.7  3.8  CL  94*  86*  87*  CO2  39*  43*  48*  BUN  34*  31*  34*  CREATININE  0.54  0.67  0.64  GLUCOSE  123*  93  103*    Electrolytes Recent Labs     02/26/17  0355  02/27/17  0020  02/28/17  0420  CALCIUM  8.2*  9.5  8.5*  MG  2.0  2.3  2.1  PHOS  3.1  2.8  3.5    Sepsis Markers Recent Labs     02/26/17  0355  PROCALCITON  7.75    ABG Recent Labs     02/26/17  0500  PHART  7.460*  PCO2ART  64.4*  PO2ART  95.9    Liver Enzymes No results for input(s): AST, ALT, ALKPHOS, BILITOT, ALBUMIN in the last  72 hours.  Cardiac Enzymes No results for input(s): TROPONINI, PROBNP in the last 72 hours.  Glucose Recent Labs     02/27/17  1128  02/27/17  1558  02/27/17  1916  02/27/17  2334  02/28/17  0425  02/28/17  0713  GLUCAP  72  244*  228*  146*  86  161*    Imaging Dg Chest Port 1 View  Result Date: 02/28/2017 CLINICAL DATA:  Acute respiratory failure, sepsis, history of COPD, EXAM: PORTABLE CHEST 1 VIEW COMPARISON:  Portable chest x-ray of February 27, 2017 FINDINGS: The lungs remain hyperinflated. There is no alveolar infiltrate. There is a tiny left pleural effusion which is stable. The heart is small but stable. The pulmonary vascularity is not engorged. The endotracheal tube tip lies 4.2 cm above the carina. The esophagogastric tubes proximal port lies above the GE junction and advancement is needed. The PICC line tip projects over the midportion of the SVC. IMPRESSION: Fairly stable appearance of the chest consistent with known COPD. Stable small right pleural effusion. Minimal subsegmental atelectasis at both bases. No alveolar pneumonia nor pulmonary edema. Advancement of the nasogastric tube by 10 cm is recommended to assure that  the proximal port remains below the GE junction. Electronically Signed   By: David  Martinique M.D.   On: 02/28/2017 07:04   Dg Chest Port 1 View  Result Date: 02/27/2017 CLINICAL DATA:  Hypoxia EXAM: PORTABLE CHEST 1 VIEW COMPARISON:  February 26, 2017 FINDINGS: In endotracheal tube tip is 2.8 cm above the carina. Nasogastric tube tip and side port are below the diaphragm. There is no evident pneumothorax. There is a small left pleural effusion with mild left base atelectasis. The lungs elsewhere clear. Heart size and pulmonary vascularity are normal. No adenopathy. There is aortic atherosclerosis. No bone lesions. IMPRESSION: Tube positions as described without evident pneumothorax. Small left pleural effusion. No edema or consolidation. There is aortic  atherosclerosis. Electronically Signed   By: Lowella Grip III M.D.   On: 02/27/2017 07:30    Assessment/Plan : Acute on chronic hypoxemic respiratory failure 2/2 severe COPD exacerbation + rhinovirus/enterovirus PNA and Strep pneumo urine Ag + mild acute pulm edema (better) - Patient has been failing weaning trials every day because of her underlying severe COPD and anxiety issues. - We extensively discussed over all [place]. I had a meeting with the patient and her daughter and her son. They all wanted to o do everything for the patient. I discussed about tracheostomy and possible risks and benefits. Patient and family agreed to tracheostomy. Plan to do tracheostomy this week. - Cont abx >> deescalate to rocephin and complete 7 days - cont medrol + neb meds (pulmicort + xopenex + atrovent) - wean off lasix > will decrease dose today.  - cont TF   Best practice : on heparin for dvt prophylaxis. On PPI for SUP.   Pt was adamant to do everything for her care. Will make her a full code.  Children updated of pts wishes and agree with the plan. We discussed the possibility of her being on chronic ventilator. They understand and agree.    I spent  30  minutes of Critical Care time with this patient today. This is my time spent independent of the APP or resident.    Monica Becton, MD 02/28/2017, 10:58 AM [facility] Pager [phone] After 3 pm or if no answer, call [phone]

## 2017-02-28 NOTE — Progress Notes (Signed)
*  PRELIMINARY RESULTS* Vascular Ultrasound Left upper extremity venous duplex has been completed.  Preliminary findings: Left - No evidence of DVT or superficial thrombosis.  Landry Mellow, RDMS, RVT  02/28/2017, 2:34 PM

## 2017-02-28 NOTE — Progress Notes (Signed)
   02/28/17 1150  Clinical Encounter Type  Visited With Patient and family together  Visit Type Spiritual support  Spiritual Encounters  Spiritual Needs Prayer;Emotional  Stress Factors  Patient Stress Factors Major life changes  Family Stress Factors Family relationships  Introduction to Pt and granddaughter. Offered prayer. Offered availability at a family conference if requested.

## 2017-03-01 LAB — BASIC METABOLIC PANEL
Anion gap: 7 (ref 5–15)
BUN: 36 mg/dL — AB (ref 6–20)
CHLORIDE: 90 mmol/L — AB (ref 101–111)
CO2: 43 mmol/L — AB (ref 22–32)
CREATININE: 0.7 mg/dL (ref 0.44–1.00)
Calcium: 8.5 mg/dL — ABNORMAL LOW (ref 8.9–10.3)
GFR calc non Af Amer: >60 mL/min (ref >60)
Glucose, Bld: 146 mg/dL — ABNORMAL HIGH (ref 65–99)
Potassium: 3.7 mmol/L (ref 3.5–5.1)
SODIUM: 140 mmol/L (ref 135–145)

## 2017-03-01 LAB — GLUCOSE, CAPILLARY
GLUCOSE-CAPILLARY: 107 mg/dL — AB (ref 65–99)
GLUCOSE-CAPILLARY: 66 mg/dL (ref 65–99)
Glucose-Capillary: 123 mg/dL — ABNORMAL HIGH (ref 65–99)
Glucose-Capillary: 136 mg/dL — ABNORMAL HIGH (ref 65–99)
Glucose-Capillary: 77 mg/dL (ref 65–99)
Glucose-Capillary: 257 mg/dL — ABNORMAL HIGH (ref 65–99)
Glucose-Capillary: 274 mg/dL — ABNORMAL HIGH (ref 65–99)
Glucose-Capillary: 85 mg/dL (ref 65–99)

## 2017-03-01 LAB — CBC WITH DIFFERENTIAL/PLATELET
Basophils Absolute: 0 10*3/uL (ref 0.0–0.1)
Basophils Relative: 0 %
EOS PCT: 1 %
Eosinophils Absolute: 0.1 10*3/uL (ref 0.0–0.7)
HCT: 34.6 % — ABNORMAL LOW (ref 36.0–46.0)
Hemoglobin: 10.6 g/dL — ABNORMAL LOW (ref 12.0–15.0)
LYMPHS ABS: 1.8 10*3/uL (ref 0.7–4.0)
LYMPHS PCT: 15 %
MCH: 31 pg (ref 26.0–34.0)
MCHC: 30.6 g/dL (ref 30.0–36.0)
MCV: 101.2 fL — AB (ref 78.0–100.0)
Monocytes Absolute: 1.1 10*3/uL — ABNORMAL HIGH (ref 0.1–1.0)
Monocytes Relative: 9 %
NEUTROS PCT: 75 %
Neutro Abs: 9 10*3/uL — ABNORMAL HIGH (ref 1.7–7.7)
PLATELETS: 331 10*3/uL (ref 150–400)
RBC: 3.42 MIL/uL — AB (ref 3.87–5.11)
RDW: 13.4 % (ref 11.5–15.5)
WBC: 11.9 10*3/uL — AB (ref 4.0–10.5)

## 2017-03-01 LAB — MAGNESIUM: MAGNESIUM: 2 mg/dL (ref 1.7–2.4)

## 2017-03-01 LAB — PHOSPHORUS: Phosphorus: 3.7 mg/dL (ref 2.5–4.6)

## 2017-03-01 MED ORDER — MIDAZOLAM HCL 2 MG/2ML IJ SOLN: 4.0000 mg | Freq: Once | INTRAMUSCULAR | Status: DC

## 2017-03-01 MED ORDER — DEXTROSE 50 % IV SOLN
INTRAVENOUS | Status: AC
  Administered 2017-03-01: 50 mL
  Filled 2017-03-01: qty 50

## 2017-03-01 MED ORDER — PREDNISONE 20 MG PO TABS
40.0000 mg | ORAL_TABLET | Freq: Every day | ORAL | Status: DC
  Administered 2017-03-01 – 2017-03-02 (×2): 40 mg via ORAL
  Filled 2017-03-01 (×2): qty 2

## 2017-03-01 MED ORDER — PROPOFOL 500 MG/50ML IV EMUL: 5.0000 ug/kg/min | Freq: Once | INTRAVENOUS | Status: DC

## 2017-03-01 MED ORDER — VECURONIUM BROMIDE 10 MG IV SOLR: 10.0000 mg | Freq: Once | INTRAVENOUS | Status: DC

## 2017-03-01 MED ORDER — ENOXAPARIN SODIUM 30 MG/0.3ML ~~LOC~~ SOLN
30.0000 mg | SUBCUTANEOUS | Status: DC
  Administered 2017-03-01 – 2017-03-08 (×8): 30 mg via SUBCUTANEOUS
  Filled 2017-03-01 (×8): qty 0.3

## 2017-03-01 MED ORDER — CLONAZEPAM 0.5 MG PO TABS
0.5000 mg | ORAL_TABLET | Freq: Every day | ORAL | Status: DC
  Administered 2017-03-01: 0.5 mg via ORAL
  Filled 2017-03-01: qty 1

## 2017-03-01 MED ORDER — FUROSEMIDE 10 MG/ML IJ SOLN
20.0000 mg | Freq: Every day | INTRAMUSCULAR | Status: AC
  Administered 2017-03-02: 20 mg via INTRAVENOUS
  Filled 2017-03-01: qty 2

## 2017-03-01 MED ORDER — ETOMIDATE 2 MG/ML IV SOLN
40.0000 mg | Freq: Once | INTRAVENOUS | Status: DC
  Filled 2017-03-01: qty 20

## 2017-03-01 NOTE — Procedures (Signed)
Pt placed back on full support at this time due to increased OWB, inc RR >30.  Pt tolerating full support well at this time, RT will monitor

## 2017-03-01 NOTE — Progress Notes (Signed)
Hypoglycemic Event  CBG: 66  Treatment: D50 IV 25 mL  Symptoms: None  Follow-up CBG: Time:2005 CBG Result:85  Possible Reasons for Event: Unknown      Megan [name]

## 2017-03-01 NOTE — Progress Notes (Signed)
PULMONARY  / CRITICAL CARE MEDICINE  Name: [name] MRN: [id number] DOB: Aug 10, 1951    LOS: 43  REFERRING MD :  ED  CHIEF COMPLAINT:  Shortness of breath Brief SS:  [name] is a 66 year old woman with COPD on home oxygen, hypertension hospitalized for acute on chronic respiratory failure. As she is intubated, history was collected from the chart.  She presented via EMS with shortness of breath and appeared cyanotic in the ED. She received Duonebs x 2, Solumedrol 125 mg IV, Mg 2g by EMS. She acknowledged chest pain, productive cough as well as full code by nodding yes. In the ED, She received adenosine 6 mg and 12 mg IV given concern for SVT though did not return to normal sinus rhythm so was started on diltiazem gtt. She did not improve on BiPAP and was intubated.    Lines / Drains: 4/22 PIV x 2 4/29 R PICC line  Cultures: 4/22 trache and blood culture (-) 4/23 RV > RSV/enterovirus+ 4/23 MRSA (+)  Antibiotics: 4/22 Vanc > 4/25 4/22 Azithro > 4/25 4/22 Ceftriaxone x 1 4/23 Cefepime > 4/29  Tests / Events: 4/22 Intubated  4/23 Acute encephalopathy. Head CT w/o acute findings.   SUBJECTIVE: No issues overnight.  Failing daily PST    Physical Exam  Constitutional: No distress.  HENT:  Head: Normocephalic and atraumatic.  ETT in place  Eyes: Conjunctivae are normal. Pupils are equal, round, and reactive to light. No scleral icterus.  Neck: Normal range of motion.  Cardiovascular: Regular rhythm and intact distal pulses.   Tachycardic  Pulmonary/Chest: Breath sounds normal. No respiratory distress. She has no wheezes.  Abdominal: Soft. She exhibits no distension.  Neurological: She is alert.  Following commands and nodding appropriately to questions  Skin: Skin is warm and dry. She is not diaphoretic.  LUE less swollen today than yesterday.     Vitals:  Vitals:   03/01/17 0741 03/01/17 0743 03/01/17 0745 03/01/17 0800  BP:  (!) 114/43  140/79  Pulse:   (!) 104  (!) 113  Resp:  19  (!) 21  Temp:      TempSrc:      SpO2: 99% 100% 100% 98%  Weight:      Height:        Constitutional/General: chronically ill,  intubated, sedated, not in any distress  Body mass index is 17.15 kg/m. Wt Readings from Last 3 Encounters:  03/01/17 43.9 kg (96 lb 12.8 oz)  01/24/17 42.9 kg (94 lb 9.6 oz)  01/11/17 43.6 kg (96 lb 1.6 oz)    HEENT: PERLA, anicteric sclerae. (-) Oral thrush. Intubated, ETT in place  Neck: No masses. Midline trachea. No JVD, (-) LAD. (-) bruits appreciated.  Respiratory/Chest: Grossly normal chest. (-) deformity. (-) Accessory muscle use.  Symmetric expansion. Diminished BS on both lower lung zones. Occasional wheezing in upper lung zones (-) crackles, rhonchi (-) egophony  Cardiovascular: Regular rate and  rhythm, heart sounds normal, no murmur or gallops,  Trace peripheral edema  Gastrointestinal:  Normal bowel sounds. Soft, non-tender. No hepatosplenomegaly.  (-) masses.   Musculoskeletal:  Normal muscle tone.   Extremities: Grossly normal. (-) clubbing, cyanosis.  (-) edema  Skin: (-) rash,lesions seen.   Neurological/Psychiatric : sedated, intubated. CN grossly intact. (-) lateralizing signs.     CBC Recent Labs     02/27/17  0020  02/28/17  0420  03/01/17  0400  WBC  18.5*  18.5*  11.9*  HGB  14.1  11.2*  10.6*  HCT  44.7  36.2  34.6*  PLT  390  357  331    Coag's No results for input(s): APTT, INR in the last 72 hours.  BMET Recent Labs     02/27/17  0020  02/28/17  0420  03/01/17  0400  NA  141  141  140  K  4.7  3.8  3.7  CL  86*  87*  90*  CO2  43*  48*  43*  BUN  31*  34*  36*  CREATININE  0.67  0.64  0.70  GLUCOSE  93  103*  146*    Electrolytes Recent Labs     02/27/17  0020  02/28/17  0420  03/01/17  0400  CALCIUM  9.5  8.5*  8.5*  MG  2.3  2.1  2.0  PHOS  2.8  3.5  3.7    Sepsis Markers No results for input(s): PROCALCITON, O2SATVEN in the last 72  hours.  Invalid input(s): LACTICACIDVEN  ABG No results for input(s): PHART, PCO2ART, PO2ART in the last 72 hours.  Liver Enzymes No results for input(s): AST, ALT, ALKPHOS, BILITOT, ALBUMIN in the last 72 hours.  Cardiac Enzymes No results for input(s): TROPONINI, PROBNP in the last 72 hours.  Glucose Recent Labs     02/28/17  1532  02/28/17  1928  02/28/17  2018  02/28/17  2326  03/01/17  0302  03/01/17  0720  GLUCAP  124*  62*  96  93  123*  136*    Imaging Dg Chest Port 1 View  Result Date: 02/28/2017 CLINICAL DATA:  Acute respiratory failure, sepsis, history of COPD, EXAM: PORTABLE CHEST 1 VIEW COMPARISON:  Portable chest x-ray of February 27, 2017 FINDINGS: The lungs remain hyperinflated. There is no alveolar infiltrate. There is a tiny left pleural effusion which is stable. The heart is small but stable. The pulmonary vascularity is not engorged. The endotracheal tube tip lies 4.2 cm above the carina. The esophagogastric tubes proximal port lies above the GE junction and advancement is needed. The PICC line tip projects over the midportion of the SVC. IMPRESSION: Fairly stable appearance of the chest consistent with known COPD. Stable small right pleural effusion. Minimal subsegmental atelectasis at both bases. No alveolar pneumonia nor pulmonary edema. Advancement of the nasogastric tube by 10 cm is recommended to assure that the proximal port remains below the GE junction. Electronically Signed   By: David  Martinique M.D.   On: 02/28/2017 07:04   Dg Abd Portable 1v  Result Date: 02/28/2017 CLINICAL DATA:  Generalized abdominal pain. EXAM: PORTABLE ABDOMEN - 1 VIEW COMPARISON:  None. FINDINGS: The bowel gas pattern is normal. Distal tip of nasogastric tube is seen in expected position of gastroesophageal junction. No radio-opaque calculi or other significant radiographic abnormality are seen. IMPRESSION: No evidence of bowel obstruction or ileus. Distal tip of nasogastric tube seen  in expected position of gastroesophageal junction. Electronically Signed   By: Marijo Conception, M.D.   On: 02/28/2017 18:43     ASSESSMENT / PLAN:  PULMONARY  ASSESSMENT: Acute on chronic hypercapnic respiratory failure 2/2 severe AECOPD +  rhinovirus/enterovirus PNA and Strep pneumo urine Ag + pulm edema  PLAN:   Continue mechanical ventilation with daily PST.  She has been failing daily PST Plan for trache on Wednesday or Thursday Continue bronchodilators with pulmicort and duoneb qid Swith IV steroids to PO at 40 mg  Decrease lasix to daily 20  mg   CARDIOVASCULAR  ASSESSMENT:  Mild pulm edema CHFpEF exacerbation Sinus tachycardia  PLAN:  Holding IV fluids Continue metoprolol 5 mg every 6 hours as needed Decrease lasix to 20 mg IV daily from BID   RENAL  ASSESSMENT:   Acute kidney injury Pulm edema  PLAN:   Follow BMET Lasix as noted above   GASTROINTESTINAL  ASSESSMENT:   No active issues   PLAN:   Continue bowel regimen and tube feeds. Continue ppi   HEMATOLOGIC  ASSESSMENT:   Left arm swelling with (-) for DVT  PLAN:  Trend CBC Continue Lovenox for DVT prophylaxis   INFECTIOUS  ASSESSMENT:   S/P rhinovirus/enterovirus PNA and possibly Strep pneumo  PLAN:   Completed 7-day course of cefepime for Strep pneumo coverage   ENDOCRINE  ASSESSMENT:   Hyperglycemia then hypoglycemia then stable  PLAN:    ICU glycemic protocol   NEUROLOGIC  ASSESSMENT:   Anxiety/depression Acute encephalopathy, improved Has PTSD (saw son shoot himself)  PLAN:   Continue Versed prn Will add clonazepam 0.5 mg daily    I spent  30  minutes of Critical Care time with this patient today.    Monica Becton, MD 03/01/2017, 8:33 AM [facility] Pager [phone] After 3 pm or if no answer, call [phone]

## 2017-03-02 ENCOUNTER — Inpatient Hospital Stay (HOSPITAL_COMMUNITY): Payer: Medicare Other

## 2017-03-02 DIAGNOSIS — Z430 Encounter for attention to tracheostomy: Secondary | ICD-10-CM

## 2017-03-02 LAB — GLUCOSE, CAPILLARY
GLUCOSE-CAPILLARY: 93 mg/dL (ref 65–99)
GLUCOSE-CAPILLARY: 94 mg/dL (ref 65–99)
GLUCOSE-CAPILLARY: 177 mg/dL — AB (ref 65–99)
GLUCOSE-CAPILLARY: 85 mg/dL (ref 65–99)
Glucose-Capillary: 159 mg/dL — ABNORMAL HIGH (ref 65–99)
Glucose-Capillary: 95 mg/dL (ref 65–99)

## 2017-03-02 LAB — CBC WITH DIFFERENTIAL/PLATELET
BASOS ABS: 0 10*3/uL (ref 0.0–0.1)
Basophils Relative: 0 %
EOS PCT: 1 %
Eosinophils Absolute: 0.1 10*3/uL (ref 0.0–0.7)
HEMATOCRIT: 32.1 % — AB (ref 36.0–46.0)
Hemoglobin: 9.8 g/dL — ABNORMAL LOW (ref 12.0–15.0)
LYMPHS ABS: 1.6 10*3/uL (ref 0.7–4.0)
LYMPHS PCT: 16 %
MCH: 31.2 pg (ref 26.0–34.0)
MCHC: 30.5 g/dL (ref 30.0–36.0)
MCV: 102.2 fL — AB (ref 78.0–100.0)
MONO ABS: 1 10*3/uL (ref 0.1–1.0)
MONOS PCT: 10 %
NEUTROS ABS: 7.3 10*3/uL (ref 1.7–7.7)
Neutrophils Relative %: 73 %
Platelets: 327 10*3/uL (ref 150–400)
RBC: 3.14 MIL/uL — ABNORMAL LOW (ref 3.87–5.11)
RDW: 13.2 % (ref 11.5–15.5)
WBC: 9.9 10*3/uL (ref 4.0–10.5)

## 2017-03-02 LAB — BASIC METABOLIC PANEL
Anion gap: 7 (ref 5–15)
BUN: 36 mg/dL — AB (ref 6–20)
CHLORIDE: 97 mmol/L — AB (ref 101–111)
CO2: 39 mmol/L — AB (ref 22–32)
Calcium: 8.6 mg/dL — ABNORMAL LOW (ref 8.9–10.3)
Creatinine, Ser: 0.6 mg/dL (ref 0.44–1.00)
GFR calc Af Amer: >60 mL/min (ref >60)
GFR calc non Af Amer: >60 mL/min (ref >60)
Glucose, Bld: 111 mg/dL — ABNORMAL HIGH (ref 65–99)
POTASSIUM: 3.9 mmol/L (ref 3.5–5.1)
SODIUM: 143 mmol/L (ref 135–145)

## 2017-03-02 LAB — PROTIME-INR
INR: 0.97
PROTHROMBIN TIME: 12.9 s (ref 11.4–15.2)

## 2017-03-02 LAB — APTT: aPTT: 24 seconds (ref 24–36)

## 2017-03-02 LAB — PHOSPHORUS: Phosphorus: 2.9 mg/dL (ref 2.5–4.6)

## 2017-03-02 LAB — MAGNESIUM: Magnesium: 2.2 mg/dL (ref 1.7–2.4)

## 2017-03-02 MED ORDER — MORPHINE SULFATE (PF) 2 MG/ML IV SOLN
2.0000 mg | INTRAVENOUS | Status: DC | PRN
  Administered 2017-03-02 – 2017-03-03 (×7): 2 mg via INTRAVENOUS
  Filled 2017-03-02 (×7): qty 1

## 2017-03-02 MED ORDER — VECURONIUM BROMIDE 10 MG IV SOLR
10.0000 mg | Freq: Once | INTRAVENOUS | Status: AC
  Administered 2017-03-02: 10 mg via INTRAVENOUS

## 2017-03-02 MED ORDER — PREDNISONE 10 MG PO TABS: 5.0000 mg | ORAL_TABLET | Freq: Every day | ORAL | Status: DC

## 2017-03-02 MED ORDER — PREDNISONE 10 MG PO TABS
10.0000 mg | ORAL_TABLET | Freq: Every day | ORAL | Status: AC
  Administered 2017-03-07 – 2017-03-08 (×2): 10 mg via ORAL
  Filled 2017-03-02 (×2): qty 1

## 2017-03-02 MED ORDER — CLONAZEPAM 0.5 MG PO TABS
0.5000 mg | ORAL_TABLET | Freq: Two times a day (BID) | ORAL | Status: DC
  Administered 2017-03-02 – 2017-03-03 (×4): 0.5 mg via ORAL
  Filled 2017-03-02 (×4): qty 1

## 2017-03-02 MED ORDER — PREDNISONE 10 MG PO TABS
30.0000 mg | ORAL_TABLET | Freq: Every day | ORAL | Status: AC
  Administered 2017-03-03 – 2017-03-04 (×2): 30 mg via ORAL
  Filled 2017-03-02 (×2): qty 1

## 2017-03-02 MED ORDER — FENTANYL CITRATE (PF) 100 MCG/2ML IJ SOLN
INTRAMUSCULAR | Status: AC
  Administered 2017-03-02: 100 ug via INTRAVENOUS
  Filled 2017-03-02: qty 4

## 2017-03-02 MED ORDER — FENTANYL CITRATE (PF) 100 MCG/2ML IJ SOLN
200.0000 ug | Freq: Once | INTRAMUSCULAR | Status: AC
  Administered 2017-03-02: 100 ug via INTRAVENOUS

## 2017-03-02 MED ORDER — SODIUM CHLORIDE 0.9 % IV SOLN
0.0000 ug/min | INTRAVENOUS | Status: DC
  Administered 2017-03-02: 50 ug/min via INTRAVENOUS
  Filled 2017-03-02: qty 1

## 2017-03-02 MED ORDER — PREDNISONE 20 MG PO TABS
20.0000 mg | ORAL_TABLET | Freq: Every day | ORAL | Status: AC
  Administered 2017-03-05 – 2017-03-06 (×2): 20 mg via ORAL
  Filled 2017-03-02 (×2): qty 1

## 2017-03-02 MED ORDER — PROPOFOL 1000 MG/100ML IV EMUL
5.0000 ug/kg/min | INTRAVENOUS | Status: DC
  Administered 2017-03-02: 50 ug/kg/min via INTRAVENOUS

## 2017-03-02 MED ORDER — ETOMIDATE 2 MG/ML IV SOLN
20.0000 mg | Freq: Once | INTRAVENOUS | Status: AC
  Administered 2017-03-02: 20 mg via INTRAVENOUS

## 2017-03-02 MED ORDER — SERTRALINE HCL 25 MG PO TABS
25.0000 mg | ORAL_TABLET | Freq: Every day | ORAL | Status: DC
  Administered 2017-03-02 – 2017-03-08 (×7): 25 mg via ORAL
  Filled 2017-03-02 (×7): qty 1

## 2017-03-02 NOTE — Procedures (Signed)
Bedside Tracheostomy Insertion Procedure Note   Patient Details:   Name: Megan [name] DOB: [birth year] MRN: [id number]  Procedure: Tracheostomy  Pre Procedure Assessment: ET Tube Size:7.5 ET Tube secured at lip (cm): 24 Bite block in place: No Breath Sounds: Clear  Post Procedure Assessment: BP (!) 124/56   Pulse (!) 107   Temp 98.5 F (36.9 C) (Oral)   Resp 14   Ht '5\' 3"'$  (1.6 m)   Wt 102 lb 3.2 oz (46.4 kg)   SpO2 100%   BMI 18.10 kg/m  O2 sats: stable throughout Complications: No apparent complications Patient did tolerate procedure well Tracheostomy Brand:Shiley Tracheostomy Style:Cuffed Tracheostomy Size: 6.0 Tracheostomy Secured XJD:[id number] Tracheostomy Placement Confirmation:Trach cuff visualized and in place    Phillis Knack [facility] 03/02/2017, 2:14 PM

## 2017-03-02 NOTE — Procedures (Signed)
Procedure done by P Babcock ACNP-BC, under direct supervision of Dr Nelda Marseille. At first bronch was introduce through ET tube and structures of tracheal rings, carina identified for operator of tracheostomy who was Dr Nelda Marseille. Light of bronch passed through trachea and skin for indentification of tracheal rings for tracheostomy puncture. After this, under bronchoscopy guidance,  ET tube was pulled back sufficiently and very carefully. The ET tube was  pulled back enough to give room for tracheostomy operator and yet at same time to to ensure a secured airway. After this was accomplished, bronchoscope was withdrawn into the ET tube. After this,  Dr Nelda Marseille then performed tracheostomy under video visual provided by flexible video bronchoscopy. Followng introduction of tracheostomy,  the bronchoscope was removed from ET tube and introduced through tracheostomy. Correct position of tracheostomy was ensured, with enough room between carina and distal tracheostomy and no evidence of bleeding. The bronchoscope was then withdrawn. Respiratory therapist was then instructed to remove the ET tube.  Dr Nelda Marseille  then proceeded to complete the tracheostomy with stay sutures   No complications   Erick Colace ACNP-BC [place] Pager # [phone] OR # [phone] if no answer  Rush Farmer, M.D. [facility] Pulmonary/Critical Care Medicine. Pager: [phone]. After hours pager: [phone].

## 2017-03-02 NOTE — Procedures (Signed)
Percutaneous Tracheostomy Placement  Consent from family.  Patient sedated, paralyzed and position.  Placed on 100% FiO2 and RR matched.  Area cleaned and draped.  Lidocaine/epi injected.  Skin incision done followed by blunt dissection.  Trachea palpated then punctured, catheter passed and visualized bronchoscopically.  Wire placed and visualized.  Catheter removed.  Airway then entered and dilated.  Size 6 cuffed shiley trach placed and visualized bronchoscopically well above carina.  Good volume returns.  Patient tolerated the procedure well without complications.  Minimal blood loss.  CXR ordered and pending.  Wesam G. Yacoub, M.D. [place] Pulmonary/Critical Care Medicine. Pager: [phone]. After hours pager: [phone].  

## 2017-03-02 NOTE — Care Management Note (Signed)
Case Management Note  Patient Details  Name: Megan [name] MRN: [id number] Date of Birth: 1951-01-07  Subjective/Objective:   Pt admitted with rhino and enterovirus                Action/Plan:   PTA from home but per daughter - pt is  not able to perform most ADL's.  Pt is now on ventilator - now DNR.   Pt has extensive respiratory history .  CM will continue to follow for discharge needs   Expected Discharge Date:                  Expected Discharge Plan:     In-House Referral:  Clinical Social Work  Discharge planning Services  CM Consult  Post Acute Care Choice:    Choice offered to:     DME Arranged:    DME Agency:     HH Arranged:    [place] Agency:     Status of Service:     If discussed at [employer], dates discussed:    Additional Comments: Pt now trached - unsure of how quickly will wean - possibly wean quickly to TC.  CM will discuss [facility] referral with physician advisor.   Maryclare Labrador, RN 03/02/2017, 2:26 PM

## 2017-03-02 NOTE — Progress Notes (Signed)
PULMONARY  / CRITICAL CARE MEDICINE  Name: Megan [name] MRN: [id number] DOB: Dec 21, 1950    LOS: 57  REFERRING MD :  ED  CHIEF COMPLAINT:  Shortness of breath Brief SS:  [name] is a 66 year old woman with COPD on home oxygen, hypertension hospitalized for acute on chronic respiratory failure. As she is intubated, history was collected from the chart.  She presented via EMS with shortness of breath and appeared cyanotic in the ED. She received Duonebs x 2, Solumedrol 125 mg IV, Mg 2g by EMS. She acknowledged chest pain, productive cough as well as full code by nodding yes. In the ED, She received adenosine 6 mg and 12 mg IV given concern for SVT though did not return to normal sinus rhythm so was started on diltiazem gtt. She did not improve on BiPAP and was intubated.    Lines / Drains: 4/22 PIV x 2 4/29 R PICC line  Cultures: 4/22 trache and blood culture (-) 4/23 RV > RSV/enterovirus+ 4/23 MRSA (+)  Antibiotics: 4/22 Vanc > 4/25 4/22 Azithro > 4/25 4/22 Ceftriaxone x 1 4/23 Cefepime > 4/29  Tests / Events: 4/22 Intubated  4/23 Acute encephalopathy. Head CT w/o acute findings.   SUBJECTIVE: No issues overnight.  Failing daily PST . Got 24 mg of versed last 24 hrs.  Comfortable this am.    Physical Exam  Constitutional: No distress.  HENT:  Head: Normocephalic and atraumatic.  ETT in place  Eyes: Conjunctivae are normal. Pupils are equal, round, and reactive to light. No scleral icterus.  Neck: Normal range of motion.  Cardiovascular: Regular rhythm and intact distal pulses.   Tachycardic  Pulmonary/Chest: Breath sounds normal. No respiratory distress. She has no wheezes.  Abdominal: Soft. She exhibits no distension.  Neurological: She is alert.  Following commands and nodding appropriately to questions  Skin: Skin is warm and dry. She is not diaphoretic.     Vitals:  Vitals:   03/02/17 0700 03/02/17 0721 03/02/17 0727 03/02/17 0800  BP: 130/63    (!) 103/58  Pulse: 89   97  Resp: 15   (!) 36  Temp:  98.7 F (37.1 C)    TempSrc:  Oral    SpO2: 100%  100% 100%  Weight:      Height:        Constitutional/General: chronically ill,  intubated, sedated, not in any distress  Body mass index is 18.1 kg/m. Wt Readings from Last 3 Encounters:  03/02/17 46.4 kg (102 lb 3.2 oz)  01/24/17 42.9 kg (94 lb 9.6 oz)  01/11/17 43.6 kg (96 lb 1.6 oz)    HEENT: PERLA, anicteric sclerae. (-) Oral thrush. Intubated, ETT in place  Neck: No masses. Midline trachea. No JVD, (-) LAD. (-) bruits appreciated.  Respiratory/Chest: Grossly normal chest. (-) deformity. (-) Accessory muscle use.  Symmetric expansion. Diminished BS on both lower lung zones. Occasional wheezing in upper lung zones (-) crackles, rhonchi (-) egophony  Cardiovascular: Regular rate and  rhythm, heart sounds normal, no murmur or gallops,  Trace peripheral edema  Gastrointestinal:  Normal bowel sounds. Soft, non-tender. No hepatosplenomegaly.  (-) masses.   Musculoskeletal:  Normal muscle tone.   Extremities: Grossly normal. (-) clubbing, cyanosis.  (-) edema  Skin: (-) rash,lesions seen.   Neurological/Psychiatric : sedated, intubated. CN grossly intact. (-) lateralizing signs.     CBC Recent Labs     02/28/17  0420  03/01/17  0400  03/02/17  0430  WBC  18.5*  11.9*  9.9  HGB  11.2*  10.6*  9.8*  HCT  36.2  34.6*  32.1*  PLT  357  331  327    Coag's Recent Labs     03/02/17  0430  APTT  24  INR  0.97    BMET Recent Labs     02/28/17  0420  03/01/17  0400  03/02/17  0430  NA  141  140  143  K  3.8  3.7  3.9  CL  87*  90*  97*  CO2  48*  43*  39*  BUN  34*  36*  36*  CREATININE  0.64  0.70  0.60  GLUCOSE  103*  146*  111*    Electrolytes Recent Labs     02/28/17  0420  03/01/17  0400  03/02/17  0430  CALCIUM  8.5*  8.5*  8.6*  MG  2.1  2.0  2.2  PHOS  3.5  3.7  2.9    Sepsis Markers No results for input(s): PROCALCITON,  O2SATVEN in the last 72 hours.  Invalid input(s): LACTICACIDVEN  ABG No results for input(s): PHART, PCO2ART, PO2ART in the last 72 hours.  Liver Enzymes No results for input(s): AST, ALT, ALKPHOS, BILITOT, ALBUMIN in the last 72 hours.  Cardiac Enzymes No results for input(s): TROPONINI, PROBNP in the last 72 hours.  Glucose Recent Labs     03/01/17  1551  03/01/17  2040  03/01/17  2105  03/01/17  2337  03/02/17  0422  03/02/17  0713  GLUCAP  257*  66  85  107*  93  94    Imaging Dg Abd Portable 1v  Result Date: 02/28/2017 CLINICAL DATA:  Generalized abdominal pain. EXAM: PORTABLE ABDOMEN - 1 VIEW COMPARISON:  None. FINDINGS: The bowel gas pattern is normal. Distal tip of nasogastric tube is seen in expected position of gastroesophageal junction. No radio-opaque calculi or other significant radiographic abnormality are seen. IMPRESSION: No evidence of bowel obstruction or ileus. Distal tip of nasogastric tube seen in expected position of gastroesophageal junction. Electronically Signed   By: Marijo Conception, M.D.   On: 02/28/2017 18:43     ASSESSMENT / PLAN:  PULMONARY  ASSESSMENT: Acute on chronic hypercapnic respiratory failure 2/2 severe AECOPD +  rhinovirus/enterovirus PNA and Strep pneumo urine Ag + pulm edema (improved)  PLAN:   Continue mechanical ventilation with daily PST.  She has been failing daily PST Plan for trache today.  Continue bronchodilators with pulmicort and duoneb qid On pred 40 mg started on 5/1 > will wean off in 1 week. Will place order.  Lasix 20 mg IV today and plan to d/c in am.    CARDIOVASCULAR  ASSESSMENT:  Mild pulm edema, improved CHFpEF exacerbation Sinus tachycardia  PLAN:  Holding IV fluids Continue metoprolol 5 mg every 6 hours as needed Lasix today then d/c after todays dose. She is euvolemic.    RENAL  ASSESSMENT:   Acute kidney injury, improved Pulm edema, improved  PLAN:   Follow BMET Lasix as noted  above   GASTROINTESTINAL  ASSESSMENT:   No active issues   PLAN:   Continue bowel regimen and tube feeds. Continue ppi   HEMATOLOGIC  ASSESSMENT:   Left arm swelling with (-) for DVT  PLAN:  Trend CBC Continue Lovenox for DVT prophylaxis   INFECTIOUS  ASSESSMENT:   S/P rhinovirus/enterovirus PNA and possibly Strep pneumo  PLAN:   Completed 7-day course of cefepime  for Strep pneumo coverage   ENDOCRINE  ASSESSMENT:   Hyperglycemia then hypoglycemia then stable  PLAN:    ICU glycemic protocol   NEUROLOGIC  ASSESSMENT:   Anxiety/depression Acute encephalopathy, improved Has PTSD (saw son shoot himself)  PLAN:   Continue Versed prn. She got 24 mg versed last 24 hrs.  Increase clonazepam 0.5 mg BID from daily. Add zoloft 25 mg daily.     I spent  30  minutes of Critical Care time with this patient today.    Monica Becton, MD 03/02/2017, 8:16 AM [place] Pulmonary and Critical Care Pager [phone] After 3 pm or if no answer, call [phone]

## 2017-03-03 DIAGNOSIS — F419 Anxiety disorder, unspecified: Secondary | ICD-10-CM

## 2017-03-03 LAB — GLUCOSE, CAPILLARY
GLUCOSE-CAPILLARY: 31 mg/dL — AB (ref 65–99)
GLUCOSE-CAPILLARY: 181 mg/dL — AB (ref 65–99)
GLUCOSE-CAPILLARY: 79 mg/dL (ref 65–99)
GLUCOSE-CAPILLARY: 117 mg/dL — AB (ref 65–99)
Glucose-Capillary: 146 mg/dL — ABNORMAL HIGH (ref 65–99)
Glucose-Capillary: 255 mg/dL — ABNORMAL HIGH (ref 65–99)
Glucose-Capillary: 76 mg/dL (ref 65–99)

## 2017-03-03 LAB — CBC WITH DIFFERENTIAL/PLATELET
BASOS ABS: 0 10*3/uL (ref 0.0–0.1)
BASOS PCT: 0 %
EOS ABS: 0.1 10*3/uL (ref 0.0–0.7)
EOS PCT: 1 %
HCT: 30.1 % — ABNORMAL LOW (ref 36.0–46.0)
Hemoglobin: 9.1 g/dL — ABNORMAL LOW (ref 12.0–15.0)
LYMPHS PCT: 14 %
Lymphs Abs: 1.4 10*3/uL (ref 0.7–4.0)
MCH: 30.6 pg (ref 26.0–34.0)
MCHC: 30.2 g/dL (ref 30.0–36.0)
MCV: 101.3 fL — AB (ref 78.0–100.0)
MONO ABS: 1.1 10*3/uL — AB (ref 0.1–1.0)
Monocytes Relative: 11 %
NEUTROS ABS: 7.4 10*3/uL (ref 1.7–7.7)
Neutrophils Relative %: 74 %
PLATELETS: 293 10*3/uL (ref 150–400)
RBC: 2.97 MIL/uL — AB (ref 3.87–5.11)
RDW: 13.2 % (ref 11.5–15.5)
WBC: 10 10*3/uL (ref 4.0–10.5)

## 2017-03-03 LAB — BASIC METABOLIC PANEL
Anion gap: 7 (ref 5–15)
BUN: 42 mg/dL — AB (ref 6–20)
CALCIUM: 8.5 mg/dL — AB (ref 8.9–10.3)
CO2: 38 mmol/L — ABNORMAL HIGH (ref 22–32)
CREATININE: 0.67 mg/dL (ref 0.44–1.00)
Chloride: 97 mmol/L — ABNORMAL LOW (ref 101–111)
GFR calc non Af Amer: >60 mL/min (ref >60)
Glucose, Bld: 158 mg/dL — ABNORMAL HIGH (ref 65–99)
Potassium: 3.8 mmol/L (ref 3.5–5.1)
SODIUM: 142 mmol/L (ref 135–145)

## 2017-03-03 LAB — MAGNESIUM: MAGNESIUM: 2.1 mg/dL (ref 1.7–2.4)

## 2017-03-03 LAB — PHOSPHORUS: PHOSPHORUS: 3.7 mg/dL (ref 2.5–4.6)

## 2017-03-03 MED ORDER — MIDAZOLAM HCL 2 MG/2ML IJ SOLN
1.0000 mg | INTRAMUSCULAR | Status: DC | PRN
  Administered 2017-03-03 – 2017-03-07 (×7): 2 mg via INTRAVENOUS
  Filled 2017-03-03 (×7): qty 2

## 2017-03-03 MED ORDER — DEXTROSE 50 % IV SOLN
INTRAVENOUS | Status: AC
  Administered 2017-03-03: 50 mL
  Filled 2017-03-03: qty 50

## 2017-03-03 MED ORDER — DIPHENHYDRAMINE HCL 12.5 MG/5ML PO ELIX
12.5000 mg | ORAL_SOLUTION | Freq: Four times a day (QID) | ORAL | Status: DC | PRN
  Administered 2017-03-03 – 2017-03-04 (×2): 12.5 mg
  Filled 2017-03-03 (×3): qty 5

## 2017-03-03 NOTE — Evaluation (Addendum)
Passy-Muir Speaking Valve - Evaluation Patient Details  Name: Megan [name] MRN: [id number] Date of Birth: 04/04/1951  Today's Date: 03/03/2017 Time: 1440-1505 SLP Time Calculation (min) (ACUTE ONLY): 25 min  Past Medical History:  Past Medical History:  Diagnosis Date  . Anemia   . Anxiety state 08/20/2015  . CAP (community acquired pneumonia)   . CHF (congestive heart failure) ([place])   . COPD (chronic obstructive pulmonary disease) ([place])   . Depression   . Essential hypertension 08/19/2015  . GERD (gastroesophageal reflux disease)   . Headache   . History of hiatal hernia   . Myocardial infarction ([place])   . Shortness of breath dyspnea    Past Surgical History:  Past Surgical History:  Procedure Laterality Date  . CARDIAC CATHETERIZATION    . CARDIAC CATHETERIZATION N/A 08/26/2015   Procedure: Left Heart Cath and Coronary Angiography;  Surgeon: Jettie Booze, MD;  Location: [facility];  Service: Cardiovascular;  Laterality: N/A;   HPI:  PRESENT ILLNESS:   Assessment / Plan / Recommendation Clinical Impression  Pt was up in bed and alert when entering room. After explaining PMV purpose and evaluation pt agreed to attempt PMV trial. Pt was on ATC with 30% O2 with cuff partially inflated, #6 Shiley. Pt tolerated cuff deflation and was able to vocalize with finger occlusion. PMV was placed. Pt coughed up secretions to mouth and orally suctioned. Pt was able to vocalize. She described her own voice as hoarse and deeper than usual. Pt tolerated valve for 25 minutes with no changes in RR (18-21), O2 sats 95, and HR 104-107.  Nursing was notified patient is tolerating valve and she was comfortable with patient keeping PMV on with intermittent supervision. PMV signage posted to wear all waking hours with intermittent supervision.   SLP Visit Diagnosis: Aphonia (R49.1)    SLP Assessment  Patient needs continued Speech Lanaguage Pathology Services    Follow Up  Recommendations   Speech therapy to follow up for PMV tolerance#6     Frequency and Duration    1 week    PMSV Trial PMSV was placed for: 25 minutes and left in place, nurse notified Able to redirect subglottic air through upper airway: Yes Able to Attain Phonation: Yes Voice Quality: Hoarse Able to Expectorate Secretions: Yes Level of Secretion Expectoration with PMSV: Oral Intelligibility: Intelligible Respirations During Trial: 19 SpO2 During Trial: 95 % Pulse During Trial: 108 Behavior: Alert;Cooperative;Good eye contact;Responsive to questions   Tracheostomy Tube   #6 Shiley cuffed    Vent Dependency  FiO2 (%): 30 %    Cuff Deflation Trial  GO Tolerated Cuff Deflation: Yes Length of Time for Cuff Deflation Trial: 25 Behavior: Alert;Cooperative;Expresses self well;Good eye contact;Responsive to questions;[place], MA, CCC-SLP 03/03/2017 4:27 PM

## 2017-03-03 NOTE — Progress Notes (Signed)
PULMONARY  / CRITICAL CARE MEDICINE  Name: Megan [name] MRN: [id number] DOB: 03-22-1951    LOS: 29  REFERRING MD :  ED  CHIEF COMPLAINT:  Shortness of breath Brief SS:  Megan [name] is a 66 year old woman with COPD on home oxygen, hypertension hospitalized for acute on chronic respiratory failure. As she is intubated, history was collected from the chart.  She presented via EMS with shortness of breath and appeared cyanotic in the ED. She received Duonebs x 2, Solumedrol 125 mg IV, Mg 2g by EMS. She acknowledged chest pain, productive cough as well as full code by nodding yes. In the ED, She received adenosine 6 mg and 12 mg IV given concern for SVT though did not return to normal sinus rhythm so was started on diltiazem gtt. She did not improve on BiPAP and was intubated.    Lines / Drains: 4/22 PIV x 2  Cultures: 4/23 RV > RSV/enterovirus  Antibiotics: 4/22 Vanc > 4/25 4/22 Azithro > 4/25 4/22 Ceftriaxone x 1 4/23 Cefepime>>> 4/29   Tests / Events: 4/22 Intubated  4/23 Acute encephalopathy. Head CT w/o acute findings. 5/2  Trache by Hyman Bible   SUBJECTIVE: Tolerated trache on 5/2 On ATC this am.  Less SOB. Calmer this day.    VITAL SIGNS: Temp:  [97.6 F (36.4 C)-98.7 F (37.1 C)] 98.5 F (36.9 C) (05/03 1127) Pulse Rate:  [81-131] 121 (05/03 1200) Resp:  [10-24] 15 (05/03 1200) BP: (54-154)/(42-99) 95/47 (05/03 1200) SpO2:  [96 %-100 %] 100 % (05/03 1200) FiO2 (%):  [40 %] 40 % (05/03 1151) Weight:  [47.2 kg (104 lb)] 47.2 kg (104 lb) (05/03 0441) HEMODYNAMICS:   VENTILATOR SETTINGS: Vent Mode: PRVC FiO2 (%):  [40 %] 40 % Set Rate:  [12 bmp] 12 bmp Vt Set:  [420 mL] 420 mL PEEP:  [5 cmH20] 5 cmH20 Plateau Pressure:  [13 cmH20-24 cmH20] 24 cmH20 INTAKE / OUTPUT: Intake/Output      05/02 0701 - 05/03 0700 05/03 0701 - 05/04 0700   I.V. (mL/kg) 267.4 (5.7) 10 (0.2)   NG/GT 739.3 345   Total Intake(mL/kg) 1006.7 (21.3) 355 (7.5)   Urine (mL/kg/hr) 1515  (1.3) 215 (0.8)   Stool     Total Output 1515 215   Net -508.3 +140         Physical Exam  Constitutional: No distress.  HENT:  Head: Normocephalic and atraumatic.  Eyes: Conjunctivae are normal. Pupils are equal, round, and reactive to light. No scleral icterus.  Neck: No JVD present. No thyromegaly present.  Cardiovascular: Regular rhythm.   Pulmonary/Chest: Breath sounds normal. No respiratory distress. She has no wheezes.  Abdominal: Soft. She exhibits no distension.  Lymphadenopathy:    She has no cervical adenopathy.  Neurological: She is alert.  Awake, oriented x 3. Follows commands.   Skin: Skin is warm and dry. She is not diaphoretic.   LABS: PULMONARY  Recent Labs Lab 02/26/17 0500  PHART 7.460*  PCO2ART 64.4*  PO2ART 95.9  HCO3 45.3*  O2SAT 98.0    CBC  Recent Labs Lab 03/01/17 0400 03/02/17 0430 03/03/17 0420  HGB 10.6* 9.8* 9.1*  HCT 34.6* 32.1* 30.1*  WBC 11.9* 9.9 10.0  PLT 331 327 293    COAGULATION  Recent Labs Lab 03/02/17 0430  INR 0.97    CARDIAC  No results for input(s): TROPONINI in the last 168 hours. No results for input(s): PROBNP in the last 168 hours.   CHEMISTRY  Recent Labs Lab  02/27/17 0020 02/28/17 0420 03/01/17 0400 03/02/17 0430 03/03/17 0420  NA 141 141 140 143 142  K 4.7 3.8 3.7 3.9 3.8  CL 86* 87* 90* 97* 97*  CO2 43* 48* 43* 39* 38*  GLUCOSE 93 103* 146* 111* 158*  BUN 31* 34* 36* 36* 42*  CREATININE 0.67 0.64 0.70 0.60 0.67  CALCIUM 9.5 8.5* 8.5* 8.6* 8.5*  MG 2.3 2.1 2.0 2.2 2.1  PHOS 2.8 3.5 3.7 2.9 3.7   Estimated Creatinine Clearance: 52.9 mL/min (by C-G formula based on SCr of 0.67 mg/dL).   LIVER  Recent Labs Lab 03/02/17 0430  INR 0.97     INFECTIOUS  Recent Labs Lab 02/25/17 0301 02/26/17 0355  PROCALCITON 19.16 7.75     ENDOCRINE CBG (last 3)   Recent Labs  03/03/17 0719 03/03/17 0811 03/03/17 1114  GLUCAP 31* 181* 255*         IMAGING x48h  - image(s)  personally visualized  -   highlighted in bold Dg Chest Port 1 View  Result Date: 03/02/2017 CLINICAL DATA:  Stat post tracheostomy placement. History of COPD and CHF EXAM: PORTABLE CHEST 1 VIEW COMPARISON:  Portable chest x-ray of February 28, 2017 FINDINGS: A tracheostomy appliance tube is present. The tip projects at the inferior margin of the clavicular heads. The lungs remain hyperinflated. Mild blunting of the costophrenic angles persists. There is no alveolar infiltrate or pneumothorax. The heart and pulmonary vascularity are normal. There is calcification in the wall of the aortic arch. The right-sided PICC line has its tip projecting over the midportion of the SVC. The observed bony thorax exhibits no acute abnormality. IMPRESSION: Positioning of the endotracheal tube is radiographically good with the tip projecting along the inferior margin of the clavicular heads. Stable hyperinflation with hemidiaphragm flattening and costophrenic angle blunting. Electronically Signed   By: David  Martinique M.D.   On: 03/02/2017 13:04    ASSESSMENT / PLAN:  PULMONARY  ASSESSMENT: Acute on chronic hypercapnic respiratory failure 2/2 severe AECOPD +  rhinovirus/enterovirus PNA and Strep pneumo urine Ag + pulm edema (improved)  PLAN: S/P trache on 5/2. On ATC.  May need nocturnal vent.  Continue bronchodilators with pulmicort and duoneb qid On pred 40 mg started on 5/1 > will wean off in 1 week. Off diuretics since 5/2.  Plan for PMV   CARDIOVASCULAR  ASSESSMENT:  Mild pulm edema, improved CHFpEF exacerbation Sinus tachycardia  PLAN: Holding IV fluids Continue metoprolol 5 mg every 6 hours as needed Off lasix since 5/2.    RENAL  ASSESSMENT:  Acute kidney injury, improved Pulm edema, improved  PLAN: Follow BMET Off lasix   GASTROINTESTINAL  ASSESSMENT:  No active issues   PLAN: Continue bowel regimen and tube feeds. Continue ppi Will need SLP evaln.      HEMATOLOGIC  ASSESSMENT:  Left arm swelling with (-) for DVT  PLAN: Trend CBC Continue Lovenox for DVT prophylaxis   INFECTIOUS  ASSESSMENT:  S/P rhinovirus/enterovirus PNA and possibly Strep pneumo  PLAN: Completed 7-day course of cefepime for Strep pneumo coverage   ENDOCRINE  ASSESSMENT:  Hyperglycemia then hypoglycemia then stable  PLAN:  ICU glycemic protocol   NEUROLOGIC  ASSESSMENT:  Anxiety/depression Acute encephalopathy, improved Has PTSD (saw son shoot himself)  PLAN: Continue Versed prn. Got less versed last 24 hrs.  Keep on clonazepam 0.5 mg BID +  zoloft 25 mg daily. Consider  Increasing to clonazepam if anxiety persists to be an issue.   Transfer to SDU  today.  TRH Dr. Reesa Chew aware.  TRH will be primary on 5/4, PCCM will follow for trache.   Monica Becton, MD 03/03/2017, 12:33 PM [place] [facility] Pager [phone] After 3 pm or if no answer, call [phone]

## 2017-03-03 NOTE — Progress Notes (Signed)
Hypoglycemic Event  CBG: 31  Treatment: D50 IV 50 mL  Symptoms: None  Follow-up CBG: [id number] CBG Result:181  Possible Reasons for Event: Unknown  Comments/MD notified:Notified Dr Verdell Carmine

## 2017-03-03 NOTE — Care Management Note (Signed)
Case Management Note  Patient Details  Name: Megan [name] MRN: [id number] Date of Birth: [birth year]  Subjective/Objective:   Pt admitted with rhino and enterovirus                Action/Plan:   PTA from home but per daughter - pt is  not able to perform most ADL's.  Pt is now on ventilator - now DNR.   Pt has extensive respiratory history .  CM will continue to follow for discharge needs   Expected Discharge Date:                  Expected Discharge Plan:     In-House Referral:  Clinical Social Work  Discharge planning Services  CM Consult  Post Acute Care Choice:    Choice offered to:     DME Arranged:    DME Agency:     HH Arranged:    [place] Agency:     Status of Service:     If discussed at [employer], dates discussed:    Additional Comments: 03/03/2017 Discussed in LOS 03/03/17 - appropriate for continued stay.  Trached yesterday and on TC today, IV lasix and tube feeds.  CM continuing to follow for possible LTACH referral but currently more appropriate for SNF - physician advisor in agreement  03/02/17 Pt now trached - unsure of how quickly will wean - possibly wean quickly to TC.  CM will discuss [facility] referral with physician advisor.   Maryclare Labrador, RN 03/03/2017, 9:37 AM

## 2017-03-03 NOTE — Progress Notes (Signed)
Patient transferred from 46M. No signs or symptoms of resp distress. No c/o pain at this time.

## 2017-03-04 ENCOUNTER — Inpatient Hospital Stay (HOSPITAL_COMMUNITY): Payer: Medicare Other

## 2017-03-04 DIAGNOSIS — J209 Acute bronchitis, unspecified: Secondary | ICD-10-CM

## 2017-03-04 DIAGNOSIS — J440 Chronic obstructive pulmonary disease with acute lower respiratory infection: Secondary | ICD-10-CM

## 2017-03-04 LAB — CBC WITH DIFFERENTIAL/PLATELET
BASOS ABS: 0 10*3/uL (ref 0.0–0.1)
Basophils Relative: 0 %
Eosinophils Absolute: 0 10*3/uL (ref 0.0–0.7)
Eosinophils Relative: 0 %
HEMATOCRIT: 32.6 % — AB (ref 36.0–46.0)
HEMOGLOBIN: 9.8 g/dL — AB (ref 12.0–15.0)
LYMPHS ABS: 0.9 10*3/uL (ref 0.7–4.0)
LYMPHS PCT: 5 %
MCH: 30.5 pg (ref 26.0–34.0)
MCHC: 30.1 g/dL (ref 30.0–36.0)
MCV: 101.6 fL — ABNORMAL HIGH (ref 78.0–100.0)
Monocytes Absolute: 2 10*3/uL — ABNORMAL HIGH (ref 0.1–1.0)
Monocytes Relative: 12 %
NEUTROS ABS: 14.1 10*3/uL — AB (ref 1.7–7.7)
Neutrophils Relative %: 83 %
Platelets: 341 10*3/uL (ref 150–400)
RBC: 3.21 MIL/uL — AB (ref 3.87–5.11)
RDW: 12.6 % (ref 11.5–15.5)
WBC: 17.1 10*3/uL — AB (ref 4.0–10.5)

## 2017-03-04 LAB — GLUCOSE, CAPILLARY
GLUCOSE-CAPILLARY: 110 mg/dL — AB (ref 65–99)
GLUCOSE-CAPILLARY: 137 mg/dL — AB (ref 65–99)
GLUCOSE-CAPILLARY: 179 mg/dL — AB (ref 65–99)
GLUCOSE-CAPILLARY: 200 mg/dL — AB (ref 65–99)
GLUCOSE-CAPILLARY: 81 mg/dL (ref 65–99)
GLUCOSE-CAPILLARY: 98 mg/dL (ref 65–99)
Glucose-Capillary: 182 mg/dL — ABNORMAL HIGH (ref 65–99)
Glucose-Capillary: 199 mg/dL — ABNORMAL HIGH (ref 65–99)

## 2017-03-04 LAB — BASIC METABOLIC PANEL
ANION GAP: 9 (ref 5–15)
BUN: 34 mg/dL — ABNORMAL HIGH (ref 6–20)
CHLORIDE: 98 mmol/L — AB (ref 101–111)
CO2: 38 mmol/L — ABNORMAL HIGH (ref 22–32)
CREATININE: 0.55 mg/dL (ref 0.44–1.00)
Calcium: 8.5 mg/dL — ABNORMAL LOW (ref 8.9–10.3)
GFR calc non Af Amer: >60 mL/min (ref >60)
Glucose, Bld: 153 mg/dL — ABNORMAL HIGH (ref 65–99)
POTASSIUM: 3.5 mmol/L (ref 3.5–5.1)
SODIUM: 145 mmol/L (ref 135–145)

## 2017-03-04 LAB — PHOSPHORUS: Phosphorus: 3.1 mg/dL (ref 2.5–4.6)

## 2017-03-04 LAB — MAGNESIUM: Magnesium: 1.9 mg/dL (ref 1.7–2.4)

## 2017-03-04 MED ORDER — MORPHINE SULFATE (PF) 4 MG/ML IV SOLN
INTRAVENOUS | Status: AC
  Administered 2017-03-04: 2 mg via INTRAMUSCULAR
  Filled 2017-03-04: qty 1

## 2017-03-04 MED ORDER — LEVALBUTEROL HCL 1.25 MG/0.5ML IN NEBU
1.2500 mg | INHALATION_SOLUTION | Freq: Four times a day (QID) | RESPIRATORY_TRACT | Status: DC
  Administered 2017-03-04 – 2017-03-08 (×17): 1.25 mg via RESPIRATORY_TRACT
  Filled 2017-03-04 (×17): qty 0.5

## 2017-03-04 MED ORDER — MORPHINE SULFATE (PF) 4 MG/ML IV SOLN
2.0000 mg | INTRAVENOUS | Status: DC | PRN
  Administered 2017-03-04 – 2017-03-08 (×19): 2 mg via INTRAVENOUS
  Filled 2017-03-04 (×19): qty 1

## 2017-03-04 MED ORDER — CLONAZEPAM 1 MG PO TABS: 1.0000 mg | ORAL_TABLET | Freq: Once | ORAL | Status: DC

## 2017-03-04 MED ORDER — CLONAZEPAM 1 MG PO TABS
1.0000 mg | ORAL_TABLET | Freq: Two times a day (BID) | ORAL | Status: DC
  Administered 2017-03-04 – 2017-03-06 (×6): 1 mg via ORAL
  Filled 2017-03-04 (×7): qty 1

## 2017-03-04 NOTE — Progress Notes (Signed)
  Speech Language Pathology Treatment: Megan [name] Speaking valve  Patient Details Name: Megan [name] MRN: [id number] DOB: Jul 08, 1951 Today's Date: 03/04/2017 Time: 5697-9480 SLP Time Calculation (min) (ACUTE ONLY): 24 min  Assessment / Plan / Recommendation Clinical Impression  F/u after yesterday's PMV evaluation.  Pt with poorer performance this morning.  RN suctioned, removing thick mucous.  Pt could only tolerate valve for brief intervals (3-4 breath cycles), with low volume/hoarse/congested phonation. VS with increasing HR, becoming tachy to 150s and Sp02 at 90%. Valve removed - reattempts brought same results, pt c/o difficulty breathing.  Efforts were quickly aborted. Pt quite anxious, asking for morphine for pain.  Informed RN.   Hold on PMV use today -  Will f/u next date to determine improved toleration as well as readiness for a swallow evaluation.  D/W RN.    HPI HPI: 66 year old woman with COPD on home oxygen, hypertension hospitalized for acute on chronic respiratory failure.  ETT 4/22; trach 5/2      SLP Plan  Continue with current plan of care       Recommendations         Patient may use Passy-Muir Speech Valve: with SLP only         Oral Care Recommendations: Oral care QID SLP Visit Diagnosis: Aphonia (R49.1) Plan: Continue with current plan of care       GO                [name] 03/04/2017, 9:07 AM

## 2017-03-04 NOTE — Progress Notes (Signed)
c/o generalized pain graded 8/10 - morphine 2 mg given intravenously.

## 2017-03-04 NOTE — Progress Notes (Signed)
Talked to patient's daughter, updated on her mother's condition.

## 2017-03-04 NOTE — Progress Notes (Signed)
PROGRESS NOTE    Megan [name]  TOI:[id number] DOB: 08/13/1951 DOA: 02/20/2017 PCP: Mauricio Po, FNP   Brief Narrative:  66 year old female with past medical history of severe COPD, anxiety, depression, hypertension, GERD came to the ER with complaints of several days of worsening dyspnea. Due to severe hypercarbic respiratory failure she was intubated and started on Solu-Medrol 60 mg, ceftriaxone and azithromycin. Due to initial tachycardia she was also started on Cardizem drip. In the ED she also received adenosine 6 mg and then 10 mg for concerns of SVT. Her antibiotics were switched to cefepime on 02/21/2017 and completed the 7 day course for strep pneumonia. Due to acute metabolic encephalopathy CT of the head was done which was negative for any acute findings. Sputum cultures were also positive for rhinovirus/enterovirus. After discussion with family for one-way extubation versus tracheostomy, family members opted for tracheostomy therefore it was placed on 03/02/2017. At this time she continues to receive metoprolol for tachycardia and currently on oral prednisone with plans to wean over 1 week.   Assessment & Plan:   Active Problems:   COPD exacerbation (HCC)   Respiratory acidosis   Sepsis ([place])   Acute kidney injury ([place])   Tracheostomy, acute management ([place])  Acute on chronic hypercarbic respiratory failure secondary to COPD and pneumonia -Acute exacerbation of COPD, rhinovirus/enterovirus pneumonia -Streptococcal antigen positive -Completed the course of antibiotics cefepime at this time -Remains afebrile  Acute on chronic COPD exacerbation status post tracheostomy -Trach care per pulmonary -Loss of thick secretions, hold off on PMV. Speech following -Taper prednisone 40 mg by mouth over 1 week -Continue nebulizer treatments. I will change her DuoNeb's to Xopenex  Sinus tachycardia -Unknown exact etiology. Been off of Lasix and she is 4 L positive since  admission -Already on Lopressor 5 mg IV every 6 hours as needed. Caution with her respiratory status -obtain chest x-ray -EKG done this morning again shows sinus tachycardia  Acute kidney injury, it is resolved at this time. We'll continue to monitor  Leukocytosis -Likely from the margination from steroid use. No signs of active infection. Status post pneumonia treatment.  Anxiety -On Klonopin 0.5 mg twice daily. I will increase it today -Continue Zoloft 25 mg daily -Versed ordered when necessary  CHFpEF -Appears to be stable at this time. Continue to monitor  Nutrition -Moderate protein calorie malnourishment. Getting nutrition through cortrack  DVT prophylaxis: Lovenox Code Status: Full  Disposition Plan: Maintain her in stepdown unit. On TC  Consultants:   Pulm   Procedures:   Trach 5/2  Antimicrobials:  4/22 Vanc > 4/25 4/22 Azithro > 4/25 4/22 Ceftriaxone x 1 4/23 Cefepime>>> 4/29    Subjective: Reports of pain around her trach site and anxiety. No other complaints. States her breathing is stable.   Objective: Vitals:   03/04/17 0700 03/04/17 0742 03/04/17 0743 03/04/17 0757  BP:    (!) 155/100  Pulse: 89  (!) 128 (!) 126  Resp: 17  20 (!) 29  Temp:    98.6 F (37 C)  TempSrc:    Oral  SpO2:  97% 97%   Weight:      Height:        Intake/Output Summary (Last 24 hours) at 03/04/17 1022 Last data filed at 03/04/17 0840  Gross per 24 hour  Intake           956.67 ml  Output             1310 ml  Net          -  353.33 ml   Filed Weights   03/02/17 0500 03/03/17 0441 03/04/17 0500  Weight: 46.4 kg (102 lb 3.2 oz) 47.2 kg (104 lb) 47.6 kg (104 lb 14.4 oz)    Examination:  General exam: Appears calm and comfortable trach in place.  Respiratory system: coarse Bs at the bases.  Cardiovascular system: S1 & S2 heard, RRR. No JVD, murmurs, rubs, gallops or clicks. No pedal edema. Gastrointestinal system: Abdomen is nondistended, soft and nontender. No  organomegaly or masses felt. Normal bowel sounds heard. Central nervous system: Alert and oriented. No focal neurological deficits. Extremities: Symmetric 5 x 5 power. Skin: No rashes, lesions or ulcers Psychiatry: Judgement and insight appear normal. Mood & affect appropriate.     Data Reviewed:   CBC:  Recent Labs Lab 02/28/17 0420 03/01/17 0400 03/02/17 0430 03/03/17 0420 03/04/17 0459  WBC 18.5* 11.9* 9.9 10.0 17.1*  NEUTROABS 15.2* 9.0* 7.3 7.4 14.1*  HGB 11.2* 10.6* 9.8* 9.1* 9.8*  HCT 36.2 34.6* 32.1* 30.1* 32.6*  MCV 100.0 101.2* 102.2* 101.3* 101.6*  PLT 357 331 327 293 676   Basic Metabolic Panel:  Recent Labs Lab 02/28/17 0420 03/01/17 0400 03/02/17 0430 03/03/17 0420 03/04/17 0459  NA 141 140 143 142 145  K 3.8 3.7 3.9 3.8 3.5  CL 87* 90* 97* 97* 98*  CO2 48* 43* 39* 38* 38*  GLUCOSE 103* 146* 111* 158* 153*  BUN 34* 36* 36* 42* 34*  CREATININE 0.64 0.70 0.60 0.67 0.55  CALCIUM 8.5* 8.5* 8.6* 8.5* 8.5*  MG 2.1 2.0 2.2 2.1 1.9  PHOS 3.5 3.7 2.9 3.7 3.1   GFR: Estimated Creatinine Clearance: 53.4 mL/min (by C-G formula based on SCr of 0.55 mg/dL). Liver Function Tests: No results for input(s): AST, ALT, ALKPHOS, BILITOT, PROT, ALBUMIN in the last 168 hours. No results for input(s): LIPASE, AMYLASE in the last 168 hours. No results for input(s): AMMONIA in the last 168 hours. Coagulation Profile:  Recent Labs Lab 03/02/17 0430  INR 0.97   Cardiac Enzymes: No results for input(s): CKTOTAL, CKMB, CKMBINDEX, TROPONINI in the last 168 hours. BNP (last 3 results) No results for input(s): PROBNP in the last 8760 hours. HbA1C: No results for input(s): HGBA1C in the last 72 hours. CBG:  Recent Labs Lab 03/03/17 2031 03/04/17 0003 03/04/17 0335 03/04/17 0756 03/04/17 0830  GLUCAP 117* 98 81 182* 200*   Lipid Profile: No results for input(s): CHOL, HDL, LDLCALC, TRIG, CHOLHDL, LDLDIRECT in the last 72 hours. Thyroid Function Tests: No  results for input(s): TSH, T4TOTAL, FREET4, T3FREE, THYROIDAB in the last 72 hours. Anemia Panel: No results for input(s): VITAMINB12, FOLATE, FERRITIN, TIBC, IRON, RETICCTPCT in the last 72 hours. Sepsis Labs:  Recent Labs Lab 02/26/17 0355  PROCALCITON 7.75    No results found for this or any previous visit (from the past 240 hour(s)).       Radiology Studies: Dg Chest Port 1 View  Result Date: 03/02/2017 CLINICAL DATA:  Stat post tracheostomy placement. History of COPD and CHF EXAM: PORTABLE CHEST 1 VIEW COMPARISON:  Portable chest x-ray of February 28, 2017 FINDINGS: A tracheostomy appliance tube is present. The tip projects at the inferior margin of the clavicular heads. The lungs remain hyperinflated. Mild blunting of the costophrenic angles persists. There is no alveolar infiltrate or pneumothorax. The heart and pulmonary vascularity are normal. There is calcification in the wall of the aortic arch. The right-sided PICC line has its tip projecting over the midportion of the SVC. The  observed bony thorax exhibits no acute abnormality. IMPRESSION: Positioning of the endotracheal tube is radiographically good with the tip projecting along the inferior margin of the clavicular heads. Stable hyperinflation with hemidiaphragm flattening and costophrenic angle blunting. Electronically Signed   By: David  Martinique M.D.   On: 03/02/2017 13:04        Scheduled Meds: . budesonide (PULMICORT) nebulizer solution  0.5 mg Nebulization BID  . chlorhexidine gluconate (MEDLINE KIT)  15 mL Mouth Rinse BID  . Chlorhexidine Gluconate Cloth  6 each Topical Daily  . clonazePAM  0.5 mg Oral BID  . enoxaparin (LOVENOX) injection  30 mg Subcutaneous Q24H  . feeding supplement (PRO-STAT SUGAR FREE 64)  30 mL Per Tube Daily  . insulin aspart  0-20 Units Subcutaneous Q4H  . levalbuterol  1.25 mg Nebulization Q6H  . mouth rinse  15 mL Mouth Rinse QID  . pantoprazole sodium  40 mg Per Tube Daily  .  predniSONE  30 mg Oral Q breakfast   Followed by  . [START ON 03/05/2017] predniSONE  20 mg Oral Q breakfast   Followed by  . [START ON 03/07/2017] predniSONE  10 mg Oral Q breakfast   Followed by  . [START ON 03/09/2017] predniSONE  5 mg Oral Q breakfast  . sennosides  5 mL Per Tube BID  . sertraline  25 mg Oral Daily  . sodium chloride flush  10-40 mL Intracatheter Q12H   Continuous Infusions: . sodium chloride 250 mL (03/01/17 2000)  . sodium chloride 10 mL/hr at 03/03/17 0600  . feeding supplement (VITAL AF 1.2 CAL) 1,000 mL (03/04/17 0840)     LOS: 12 days    Time spent: 35 mins     Izaiah Tabb Arsenio Loader, MD [facility] Pager [phone]   If 7PM-7AM, please contact night-coverage [email or web address] Password [email or web address] 03/04/2017, 10:22 AM

## 2017-03-05 DIAGNOSIS — Z430 Encounter for attention to tracheostomy: Secondary | ICD-10-CM

## 2017-03-05 LAB — CBC WITH DIFFERENTIAL/PLATELET
BASOS ABS: 0 10*3/uL (ref 0.0–0.1)
Basophils Relative: 0 %
EOS PCT: 1 %
Eosinophils Absolute: 0.1 10*3/uL (ref 0.0–0.7)
HCT: 32.6 % — ABNORMAL LOW (ref 36.0–46.0)
Hemoglobin: 10.3 g/dL — ABNORMAL LOW (ref 12.0–15.0)
LYMPHS ABS: 1.5 10*3/uL (ref 0.7–4.0)
LYMPHS PCT: 9 %
MCH: 31.2 pg (ref 26.0–34.0)
MCHC: 31.6 g/dL (ref 30.0–36.0)
MCV: 98.8 fL (ref 78.0–100.0)
MONO ABS: 2.1 10*3/uL — AB (ref 0.1–1.0)
Monocytes Relative: 12 %
NEUTROS ABS: 14 10*3/uL — AB (ref 1.7–7.7)
Neutrophils Relative %: 78 %
PLATELETS: 359 10*3/uL (ref 150–400)
RBC: 3.3 MIL/uL — ABNORMAL LOW (ref 3.87–5.11)
RDW: 12.4 % (ref 11.5–15.5)
WBC: 17.7 10*3/uL — ABNORMAL HIGH (ref 4.0–10.5)

## 2017-03-05 LAB — GLUCOSE, CAPILLARY
GLUCOSE-CAPILLARY: 103 mg/dL — AB (ref 65–99)
GLUCOSE-CAPILLARY: 255 mg/dL — AB (ref 65–99)
Glucose-Capillary: 118 mg/dL — ABNORMAL HIGH (ref 65–99)
Glucose-Capillary: 203 mg/dL — ABNORMAL HIGH (ref 65–99)
Glucose-Capillary: 184 mg/dL — ABNORMAL HIGH (ref 65–99)
Glucose-Capillary: 143 mg/dL — ABNORMAL HIGH (ref 65–99)

## 2017-03-05 LAB — BASIC METABOLIC PANEL
Anion gap: 9 (ref 5–15)
BUN: 30 mg/dL — AB (ref 6–20)
CO2: 37 mmol/L — AB (ref 22–32)
CREATININE: 0.58 mg/dL (ref 0.44–1.00)
Calcium: 8.6 mg/dL — ABNORMAL LOW (ref 8.9–10.3)
Chloride: 95 mmol/L — ABNORMAL LOW (ref 101–111)
GFR calc Af Amer: >60 mL/min (ref >60)
GFR calc non Af Amer: >60 mL/min (ref >60)
Glucose, Bld: 191 mg/dL — ABNORMAL HIGH (ref 65–99)
Potassium: 3.3 mmol/L — ABNORMAL LOW (ref 3.5–5.1)
Sodium: 141 mmol/L (ref 135–145)

## 2017-03-05 LAB — PHOSPHORUS: Phosphorus: 3.3 mg/dL (ref 2.5–4.6)

## 2017-03-05 LAB — MAGNESIUM: Magnesium: 1.9 mg/dL (ref 1.7–2.4)

## 2017-03-05 MED ORDER — POTASSIUM CHLORIDE CRYS ER 20 MEQ PO TBCR: 40.0000 meq | EXTENDED_RELEASE_TABLET | Freq: Once | ORAL | Status: DC

## 2017-03-05 MED ORDER — POTASSIUM CHLORIDE 10 MEQ/100ML IV SOLN
10.0000 meq | INTRAVENOUS | Status: AC
  Administered 2017-03-05 (×4): 10 meq via INTRAVENOUS
  Filled 2017-03-05 (×4): qty 100

## 2017-03-05 NOTE — Progress Notes (Addendum)
Report given to Nadine rn at this time

## 2017-03-05 NOTE — Plan of Care (Signed)
Problem: Pain Managment: Goal: General experience of comfort will improve Outcome: Progressing Pt. c/o pain in throat and abdomen secondary to trach.  Educated about side effects of morphine on decrease bowel function. Nodded head that she understood.  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  Encouraged to turn from side to side, but does not seem to like this position.  Problem: Activity: Goal: Risk for activity intolerance will decrease Outcome: Progressing Up to [facility]  Problem: Nutrition: Goal: Adequate nutrition will be maintained Outcome: Progressing Continuous tube feeding and CBG's ranging between 150-200  Problem: Coping: Goal: Level of anxiety will decrease Outcome: Progressing Has not needed versed tonight   Problem: Respiratory: Goal: Ability to maintain a clear airway and adequate ventilation will improve Outcome: Progressing Suctioned once this shift and she attempts to assist during suctioning by coughing to bring up secretions.  Problem: Role Relationship: Goal: Method of communication will improve Outcome: Progressing Did not pass swallowing evaluation yesterday, but is using paper and pencil to communicate needs.

## 2017-03-05 NOTE — Progress Notes (Signed)
PROGRESS NOTE    Megan [name]  DVV:[id number] DOB: [birth year] DOA: 02/20/2017 PCP: Golden Circle, FNP   Brief Narrative:  66 year old female with past medical history of severe COPD, anxiety, depression, hypertension, GERD came to the ER with complaints of several days of worsening dyspnea. Due to severe hypercarbic respiratory failure she was intubated and started on Solu-Medrol 60 mg, ceftriaxone and azithromycin. Due to initial tachycardia she was also started on Cardizem drip. In the ED she also received adenosine 6 mg and then 10 mg for concerns of SVT. Her antibiotics were switched to cefepime on 02/21/2017 and completed the 7 day course for strep pneumonia. Due to acute metabolic encephalopathy CT of the head was done which was negative for any acute findings. Sputum cultures were also positive for rhinovirus/enterovirus. After discussion with family for one-way extubation versus tracheostomy, family members opted for tracheostomy therefore it was placed on 03/02/2017. At this time she continues to receive metoprolol for tachycardia and currently on oral prednisone with plans to wean over 1 week.   Assessment & Plan:   Active Problems:   COPD exacerbation (HCC)   Respiratory acidosis   Sepsis ([place])   Acute kidney injury ([place])   Tracheostomy, acute management ([place])  Acute on chronic hypercarbic respiratory failure secondary to COPD and pneumonia -Acute exacerbation of COPD, rhinovirus/enterovirus pneumonia -Streptococcal antigen positive -Completed the course of antibiotics cefepime at this time -Remains afebrile  Acute on chronic COPD exacerbation status post tracheostomy -Trach care per pulmonary -Loss of thick secretions, hold off on PMV. Speech following -Taper prednisone 40 mg by mouth over 1 week -Continue nebulizer treatments. On Xopenex. Cont Pulmicort  Sinus tachycardia -Unknown exact etiology. Been off of Lasix and she is 4 L positive since admission -Already on  Lopressor 5 mg IV every 6 hours as needed. Caution with her respiratory status -CXR- adv COPD otherwise stable   Acute kidney injury, it is resolved at this time. We'll continue to monitor  Leukocytosis -Likely from the margination from steroid use. No signs of active infection. Status post pneumonia treatment.  Anxiety -On Klonopin increased to 75m BID.  -Continue Zoloft 25 mg daily -Versed ordered when necessary  CHFpEF -Appears to be stable at this time. Continue to monitor  Nutrition -Moderate protein calorie malnourishment. Getting nutrition through cortrack  Speech Following Will order PT/OT  DVT prophylaxis: Lovenox Code Status: Full  Disposition Plan: Maintain her in stepdown unit. On TC  Consultants:   Pulm   Procedures:   Trach 5/2  Antimicrobials:  4/22 Vanc > 4/25 4/22 Azithro > 4/25 4/22 Ceftriaxone x 1 4/23 Cefepime>>> 4/29    Subjective: States her anxiety is better along with his breathing. No other complaints.  Objective: Vitals:   03/05/17 0913 03/05/17 0923 03/05/17 1000 03/05/17 1119  BP:    (!) 95/56  Pulse:  (!) 135 (!) 116 (!) 122  Resp:  20 20   Temp:    98.1 F (36.7 C)  TempSrc:    Oral  SpO2: 97% 100% 100% 100%  Weight:      Height:        Intake/Output Summary (Last 24 hours) at 03/05/17 1229 Last data filed at 03/05/17 1000  Gross per 24 hour  Intake              702 ml  Output             1325 ml  Net             -  623 ml   Filed Weights   03/04/17 0500 03/04/17 1544 03/05/17 0419  Weight: 47.6 kg (104 lb 14.4 oz) 49.1 kg (108 lb 3.2 oz) 48.9 kg (107 lb 12.8 oz)    Examination:  General exam: Appears calm and comfortable trach in place. Cortrack in place. Frail cachectic female.  Respiratory system: coarse BS at the bases.  Cardiovascular system: S1 & S2 heard, RRR. No JVD, murmurs, rubs, gallops or clicks. No pedal edema. Gastrointestinal system: Abdomen is nondistended, soft and nontender. No organomegaly or  masses felt. Normal bowel sounds heard. Central nervous system: Alert and oriented. No focal neurological deficits. Extremities: Symmetric 5 x 5 power. Skin: No rashes, lesions or ulcers Psychiatry: Judgement and insight appear normal. Mood & affect appropriate.     Data Reviewed:   CBC:  Recent Labs Lab 03/01/17 0400 03/02/17 0430 03/03/17 0420 03/04/17 0459 03/05/17 0356  WBC 11.9* 9.9 10.0 17.1* 17.7*  NEUTROABS 9.0* 7.3 7.4 14.1* 14.0*  HGB 10.6* 9.8* 9.1* 9.8* 10.3*  HCT 34.6* 32.1* 30.1* 32.6* 32.6*  MCV 101.2* 102.2* 101.3* 101.6* 98.8  PLT 331 327 293 341 638   Basic Metabolic Panel:  Recent Labs Lab 03/01/17 0400 03/02/17 0430 03/03/17 0420 03/04/17 0459 03/05/17 0356  NA 140 143 142 145 141  K 3.7 3.9 3.8 3.5 3.3*  CL 90* 97* 97* 98* 95*  CO2 43* 39* 38* 38* 37*  GLUCOSE 146* 111* 158* 153* 191*  BUN 36* 36* 42* 34* 30*  CREATININE 0.70 0.60 0.67 0.55 0.58  CALCIUM 8.5* 8.6* 8.5* 8.5* 8.6*  MG 2.0 2.2 2.1 1.9 1.9  PHOS 3.7 2.9 3.7 3.1 3.3   GFR: Estimated Creatinine Clearance: 54.8 mL/min (by C-G formula based on SCr of 0.58 mg/dL). Liver Function Tests: No results for input(s): AST, ALT, ALKPHOS, BILITOT, PROT, ALBUMIN in the last 168 hours. No results for input(s): LIPASE, AMYLASE in the last 168 hours. No results for input(s): AMMONIA in the last 168 hours. Coagulation Profile:  Recent Labs Lab 03/02/17 0430  INR 0.97   Cardiac Enzymes: No results for input(s): CKTOTAL, CKMB, CKMBINDEX, TROPONINI in the last 168 hours. BNP (last 3 results) No results for input(s): PROBNP in the last 8760 hours. HbA1C: No results for input(s): HGBA1C in the last 72 hours. CBG:  Recent Labs Lab 03/04/17 2009 03/05/17 0042 03/05/17 0412 03/05/17 0748 03/05/17 1121  GLUCAP 137* 118* 203* 103* 255*   Lipid Profile: No results for input(s): CHOL, HDL, LDLCALC, TRIG, CHOLHDL, LDLDIRECT in the last 72 hours. Thyroid Function Tests: No results for  input(s): TSH, T4TOTAL, FREET4, T3FREE, THYROIDAB in the last 72 hours. Anemia Panel: No results for input(s): VITAMINB12, FOLATE, FERRITIN, TIBC, IRON, RETICCTPCT in the last 72 hours. Sepsis Labs: No results for input(s): PROCALCITON, LATICACIDVEN in the last 168 hours.  No results found for this or any previous visit (from the past 240 hour(s)).       Radiology Studies: Dg Chest Port 1 View  Result Date: 03/04/2017 CLINICAL DATA:  Hypoxia EXAM: PORTABLE CHEST 1 VIEW COMPARISON:  03/02/2017 FINDINGS: COPD with pulmonary hyperinflation. Tracheostomy remains in good position. Feeding tube enters the stomach. Right arm PICC tip in the SVC unchanged. Lungs remain clear.  Next. IMPRESSION: Advanced COPD. No acute radiographic abnormality and no interval change. Electronically Signed   By: Franchot Gallo M.D.   On: 03/04/2017 10:30        Scheduled Meds: . budesonide (PULMICORT) nebulizer solution  0.5 mg Nebulization BID  .  chlorhexidine gluconate (MEDLINE KIT)  15 mL Mouth Rinse BID  . Chlorhexidine Gluconate Cloth  6 each Topical Daily  . clonazePAM  1 mg Oral BID  . clonazePAM  1 mg Oral Once  . enoxaparin (LOVENOX) injection  30 mg Subcutaneous Q24H  . feeding supplement (PRO-STAT SUGAR FREE 64)  30 mL Per Tube Daily  . insulin aspart  0-20 Units Subcutaneous Q4H  . levalbuterol  1.25 mg Nebulization Q6H  . mouth rinse  15 mL Mouth Rinse QID  . pantoprazole sodium  40 mg Per Tube Daily  . predniSONE  20 mg Oral Q breakfast   Followed by  . [START ON 03/07/2017] predniSONE  10 mg Oral Q breakfast   Followed by  . [START ON 03/09/2017] predniSONE  5 mg Oral Q breakfast  . sennosides  5 mL Per Tube BID  . sertraline  25 mg Oral Daily  . sodium chloride flush  10-40 mL Intracatheter Q12H   Continuous Infusions: . sodium chloride 250 mL (03/01/17 2000)  . sodium chloride 10 mL/hr at 03/03/17 0600  . feeding supplement (VITAL AF 1.2 CAL) 1,000 mL (03/04/17 1900)     LOS: 13  days    Time spent: 35 mins     Ankit Arsenio Loader, MD [facility] Pager [phone]   If 7PM-7AM, please contact night-coverage [email or web address] Password [email or web address] 03/05/2017, 12:29 PM

## 2017-03-05 NOTE — Progress Notes (Signed)
  Speech Language Pathology Treatment: Nada Boozer Speaking valve  Patient Details Name: Megan [name] MRN: [id number] DOB: [birth year] Today's Date: 03/05/2017 Time: 3559-7416 SLP Time Calculation (min) (ACUTE ONLY): 13 min  Assessment / Plan / Recommendation Clinical Impression  Patient seen for PMSV treatment. RN reports she has been anxious this morning. Cuff pressure at 0cm; with finger occlusion, pt able to redirect small amount of air through upper airway, however her voice is aphonic, wet with significant secretions. PMV placed for intervals of 3-5 breath cycles. Despite max cues, demonstration, pt unable to expectorate secretions orally or obtain voicing. Valve removed intermittently without evidence of air trapping, O2 sats stable in upper 90s, respiratory rate remaining stable (ranging 17-23), however pt's heart rate elevated to 129. Continue PMV use with SLP only. Given poor toleration at this time, will defer swallowing evaluation, follow up next date.   HPI HPI: 66 year old woman with COPD on home oxygen, hypertension hospitalized for acute on chronic respiratory failure.  ETT 4/22; trach 5/2      SLP Plan  Continue with current plan of care       Recommendations         Patient may use Passy-Muir Speech Valve: with SLP only PMSV Supervision: Full MD: Please consider changing trach tube to : Cuffless         Oral Care Recommendations: Oral care QID Follow up Recommendations: Other (comment) (TBA) SLP Visit Diagnosis: Aphonia (R49.1) Plan: Continue with current plan of care       Grubbs, [place], [place] Speech-Language Pathologist [place] 03/05/2017, 9:57 AM

## 2017-03-06 DIAGNOSIS — R0902 Hypoxemia: Secondary | ICD-10-CM

## 2017-03-06 LAB — BASIC METABOLIC PANEL
Anion gap: 6 (ref 5–15)
BUN: 29 mg/dL — AB (ref 6–20)
CO2: 38 mmol/L — ABNORMAL HIGH (ref 22–32)
CREATININE: 0.54 mg/dL (ref 0.44–1.00)
Calcium: 8.7 mg/dL — ABNORMAL LOW (ref 8.9–10.3)
Chloride: 96 mmol/L — ABNORMAL LOW (ref 101–111)
Glucose, Bld: 167 mg/dL — ABNORMAL HIGH (ref 65–99)
POTASSIUM: 4 mmol/L (ref 3.5–5.1)
SODIUM: 140 mmol/L (ref 135–145)

## 2017-03-06 LAB — CBC WITH DIFFERENTIAL/PLATELET
BASOS ABS: 0 10*3/uL (ref 0.0–0.1)
BASOS PCT: 0 %
Eosinophils Absolute: 0.1 10*3/uL (ref 0.0–0.7)
Eosinophils Relative: 1 %
HEMATOCRIT: 32.3 % — AB (ref 36.0–46.0)
Hemoglobin: 10 g/dL — ABNORMAL LOW (ref 12.0–15.0)
LYMPHS PCT: 9 %
Lymphs Abs: 1.6 10*3/uL (ref 0.7–4.0)
MCH: 31.1 pg (ref 26.0–34.0)
MCHC: 31 g/dL (ref 30.0–36.0)
MCV: 100.3 fL — AB (ref 78.0–100.0)
MONO ABS: 1.7 10*3/uL — AB (ref 0.1–1.0)
Monocytes Relative: 9 %
NEUTROS ABS: 14.5 10*3/uL — AB (ref 1.7–7.7)
NEUTROS PCT: 81 %
Platelets: 384 10*3/uL (ref 150–400)
RBC: 3.22 MIL/uL — AB (ref 3.87–5.11)
RDW: 12.6 % (ref 11.5–15.5)
WBC: 17.9 10*3/uL — AB (ref 4.0–10.5)

## 2017-03-06 LAB — GLUCOSE, CAPILLARY
GLUCOSE-CAPILLARY: 135 mg/dL — AB (ref 65–99)
GLUCOSE-CAPILLARY: 81 mg/dL (ref 65–99)
GLUCOSE-CAPILLARY: 92 mg/dL (ref 65–99)
Glucose-Capillary: 150 mg/dL — ABNORMAL HIGH (ref 65–99)
Glucose-Capillary: 125 mg/dL — ABNORMAL HIGH (ref 65–99)
Glucose-Capillary: 154 mg/dL — ABNORMAL HIGH (ref 65–99)

## 2017-03-06 LAB — PHOSPHORUS: PHOSPHORUS: 3.4 mg/dL (ref 2.5–4.6)

## 2017-03-06 LAB — MAGNESIUM: Magnesium: 2 mg/dL (ref 1.7–2.4)

## 2017-03-06 NOTE — Progress Notes (Signed)
PROGRESS NOTE    Megan [name]  RNH:[id number] DOB: July 22, 1951 DOA: 02/20/2017 PCP: [facility] Circle, FNP   Brief Narrative:  66 year old female with past medical history of severe COPD, anxiety, depression, hypertension, GERD came to the ER with complaints of several days of worsening dyspnea. Due to severe hypercarbic respiratory failure she was intubated and started on Solu-Medrol 60 mg, ceftriaxone and azithromycin. Due to initial tachycardia she was also started on Cardizem drip. In the ED she also received adenosine 6 mg and then 10 mg for concerns of SVT. Her antibiotics were switched to cefepime on 02/21/2017 and completed the 7 day course for strep pneumonia. Due to acute metabolic encephalopathy CT of the head was done which was negative for any acute findings. Sputum cultures were also positive for rhinovirus/enterovirus. After discussion with family for one-way extubation versus tracheostomy, family members opted for tracheostomy therefore it was placed on 03/02/2017. At this time she continues to receive metoprolol for tachycardia and currently on oral prednisone with plans to wean over 1 week.   Assessment & Plan:   Active Problems:   COPD exacerbation (HCC)   Respiratory acidosis   Sepsis ([place])   Acute kidney injury ([place])   Tracheostomy, acute management ([place])  Acute on chronic hypercarbic respiratory failure secondary to COPD and pneumonia -Acute exacerbation of COPD, rhinovirus/enterovirus pneumonia. Very slow to improve. I am not sure how much and how quickly we will make much of a progress.  -Streptococcal antigen positive -Completed the course of antibiotics cefepime at this time -Remains afebrile  Acute on chronic COPD exacerbation status post tracheostomy -Trach care per pulmonary -Loss of thick secretions, hold off on PMV. Speech following -Taper prednisone over this week. Orders in place.  -Continue nebulizer treatments. On Xopenex. Cont Pulmicort  Sinus  tachycardia -Unknown exact etiology. Been off of Lasix  -Already on Lopressor 5 mg IV every 6 hours as needed. Caution with her respiratory status -CXR- adv COPD otherwise stable   Acute kidney injury, it is resolved at this time. We'll continue to monitor  Leukocytosis -Likely from the margination from steroid use. No signs of active infection. Status post pneumonia treatment.  Anxiety -On Klonopin increased to 75m BID.  -Continue Zoloft 25 mg daily -Versed ordered when necessary  CHFpEF -Appears to be stable at this time. Continue to monitor  Nutrition -Moderate protein calorie malnourishment. Getting nutrition through cortrack  Speech Following PT/OT - pending.   DVT prophylaxis: Lovenox Code Status: Full  Disposition Plan: Maintain her in stepdown unit. On TC  Consultants:   Pulm   Procedures:   Trach 5/2  Antimicrobials:  4/22 Vanc > 4/25 4/22 Azithro > 4/25 4/22 Ceftriaxone x 1 4/23 Cefepime>>> 4/29    Subjective: No complaints this morning. No acute overnight events.   Objective: Vitals:   03/06/17 0500 03/06/17 0746 03/06/17 0911 03/06/17 0918  BP:      Pulse:  (!) 112  (!) 112  Resp:    (!) 21  Temp:  98.1 F (36.7 C)    TempSrc:  Oral    SpO2:   93% 93%  Weight: 43.1 kg (95 lb 1.6 oz)     Height:        Intake/Output Summary (Last 24 hours) at 03/06/17 1045 Last data filed at 03/06/17 0014  Gross per 24 hour  Intake              320 ml  Output  700 ml  Net             -380 ml   Filed Weights   03/04/17 1544 03/05/17 0419 03/06/17 0500  Weight: 49.1 kg (108 lb 3.2 oz) 48.9 kg (107 lb 12.8 oz) 43.1 kg (95 lb 1.6 oz)    Examination:  General exam: Appears calm and comfortable trach in place. Cortrack in place. Frail cachectic female.  Respiratory system: coarse BS at the bases.  Cardiovascular system: S1 & S2 heard, RRR. No JVD, murmurs, rubs, gallops or clicks. No pedal edema. Gastrointestinal system: Abdomen is  nondistended, soft and nontender. No organomegaly or masses felt. Normal bowel sounds heard. Central nervous system: Alert and oriented. No focal neurological deficits. Extremities: Symmetric 5 x 5 power. Skin: No rashes, lesions or ulcers Psychiatry: Judgement and insight appear normal. Mood & affect appropriate.     Data Reviewed:   CBC:  Recent Labs Lab 03/02/17 0430 03/03/17 0420 03/04/17 0459 03/05/17 0356 03/06/17 0432  WBC 9.9 10.0 17.1* 17.7* 17.9*  NEUTROABS 7.3 7.4 14.1* 14.0* 14.5*  HGB 9.8* 9.1* 9.8* 10.3* 10.0*  HCT 32.1* 30.1* 32.6* 32.6* 32.3*  MCV 102.2* 101.3* 101.6* 98.8 100.3*  PLT 327 293 341 359 161   Basic Metabolic Panel:  Recent Labs Lab 03/02/17 0430 03/03/17 0420 03/04/17 0459 03/05/17 0356 03/06/17 0432  NA 143 142 145 141 140  K 3.9 3.8 3.5 3.3* 4.0  CL 97* 97* 98* 95* 96*  CO2 39* 38* 38* 37* 38*  GLUCOSE 111* 158* 153* 191* 167*  BUN 36* 42* 34* 30* 29*  CREATININE 0.60 0.67 0.55 0.58 0.54  CALCIUM 8.6* 8.5* 8.5* 8.6* 8.7*  MG 2.2 2.1 1.9 1.9 2.0  PHOS 2.9 3.7 3.1 3.3 3.4   GFR: Estimated Creatinine Clearance: 48.3 mL/min (by C-G formula based on SCr of 0.54 mg/dL). Liver Function Tests: No results for input(s): AST, ALT, ALKPHOS, BILITOT, PROT, ALBUMIN in the last 168 hours. No results for input(s): LIPASE, AMYLASE in the last 168 hours. No results for input(s): AMMONIA in the last 168 hours. Coagulation Profile:  Recent Labs Lab 03/02/17 0430  INR 0.97   Cardiac Enzymes: No results for input(s): CKTOTAL, CKMB, CKMBINDEX, TROPONINI in the last 168 hours. BNP (last 3 results) No results for input(s): PROBNP in the last 8760 hours. HbA1C: No results for input(s): HGBA1C in the last 72 hours. CBG:  Recent Labs Lab 03/05/17 1607 03/05/17 2034 03/06/17 0019 03/06/17 0342 03/06/17 0744  GLUCAP 184* 143* 92 150* 125*   Lipid Profile: No results for input(s): CHOL, HDL, LDLCALC, TRIG, CHOLHDL, LDLDIRECT in the last 72  hours. Thyroid Function Tests: No results for input(s): TSH, T4TOTAL, FREET4, T3FREE, THYROIDAB in the last 72 hours. Anemia Panel: No results for input(s): VITAMINB12, FOLATE, FERRITIN, TIBC, IRON, RETICCTPCT in the last 72 hours. Sepsis Labs: No results for input(s): PROCALCITON, LATICACIDVEN in the last 168 hours.  No results found for this or any previous visit (from the past 240 hour(s)).       Radiology Studies: No results found.      Scheduled Meds: . budesonide (PULMICORT) nebulizer solution  0.5 mg Nebulization BID  . chlorhexidine gluconate (MEDLINE KIT)  15 mL Mouth Rinse BID  . Chlorhexidine Gluconate Cloth  6 each Topical Daily  . clonazePAM  1 mg Oral BID  . clonazePAM  1 mg Oral Once  . enoxaparin (LOVENOX) injection  30 mg Subcutaneous Q24H  . feeding supplement (PRO-STAT SUGAR FREE 64)  30 mL  Per Tube Daily  . insulin aspart  0-20 Units Subcutaneous Q4H  . levalbuterol  1.25 mg Nebulization Q6H  . mouth rinse  15 mL Mouth Rinse QID  . pantoprazole sodium  40 mg Per Tube Daily  . [START ON 03/07/2017] predniSONE  10 mg Oral Q breakfast   Followed by  . [START ON 03/09/2017] predniSONE  5 mg Oral Q breakfast  . sennosides  5 mL Per Tube BID  . sertraline  25 mg Oral Daily  . sodium chloride flush  10-40 mL Intracatheter Q12H   Continuous Infusions: . sodium chloride 250 mL (03/01/17 2000)  . sodium chloride 10 mL/hr at 03/03/17 0600  . feeding supplement (VITAL AF 1.2 CAL) 1,000 mL (03/05/17 1837)     LOS: 14 days    Time spent: 35 mins     Haylea Schlichting Arsenio Loader, MD [facility] Pager [phone]   If 7PM-7AM, please contact night-coverage [email or web address] Password [email or web address] 03/06/2017, 10:45 AM

## 2017-03-06 NOTE — Progress Notes (Signed)
PT Cancellation Note  Patient Details Name: Megan [name] MRN: [id number] DOB: [birth year]   Cancelled Treatment:    Reason Eval/Treat Not Completed: Fatigue/lethargy limiting ability to participate (pt with HR 125 at rest and currently states too fatigued for eval at this time, politely declines and states she will be agreeable next date)   Kindsey Eblin B Martin Smeal 03/06/2017, 11:24 AM  Elwyn Reach, [place]

## 2017-03-06 NOTE — Progress Notes (Signed)
  Speech Language Pathology Treatment: Nada Boozer Speaking valve  Patient Details Name: Megan [name] MRN: [id number] DOB: [birth year] Today's Date: 03/06/2017 Time: 9024-0973 SLP Time Calculation (min) (ACUTE ONLY): 13 min  Assessment / Plan / Recommendation Clinical Impression  Pt seen for PMV treatment. In bed today; per RN pt's HR was in 120s this morning and medication recently given. Upon SLP arrival, HR 101, O2 sats in upper 90s and RR 14-17. Pt tolerating continuous cuff deflation. SLP elicited cough, redirection of air through upper airway with finger occlusion. Pt again unable to expectorate secretions orally. With valve placement for brief intervals of 3-5 breath cycles, pt unable to phonate or expectorate secretions despite max cues. With second placement of valve, pt with change in vitals and appeared anxious; RR up to 31, O2 sats in upper 80s, pulse 121. With removal of valve and verbal cues for relaxation, pt's vitals returned to baseline.  At this time given poor toleration, recommend PMV use with SLP only, will defer swallowing evaluation pending improvements in PMV tolerance. Will f/u next date.   HPI HPI: 66 year old woman with COPD on home oxygen, hypertension hospitalized for acute on chronic respiratory failure.  ETT 4/22; trach 5/2      SLP Plan  Continue with current plan of care       Recommendations         Patient may use Passy-Muir Speech Valve: with SLP only PMSV Supervision: Full MD: Please consider changing trach tube to : Cuffless         Oral Care Recommendations: Oral care QID Follow up Recommendations: Other (comment) (TBA) SLP Visit Diagnosis: Aphonia (R49.1) Plan: Continue with current plan of care       [place], [place], [place] Speech-Language Pathologist [place] 03/06/2017, 2:12 PM

## 2017-03-06 NOTE — Progress Notes (Signed)
Central tele called RN to state that patient's HR had increased to the 150's but not sustained. RN went in and assessed patient and found her folding her blanket in bed. Patient is alert and stable. Will continue to monitor

## 2017-03-07 ENCOUNTER — Inpatient Hospital Stay (HOSPITAL_COMMUNITY): Payer: Medicare Other

## 2017-03-07 DIAGNOSIS — Z930 Tracheostomy status: Secondary | ICD-10-CM

## 2017-03-07 DIAGNOSIS — J9611 Chronic respiratory failure with hypoxia: Secondary | ICD-10-CM

## 2017-03-07 DIAGNOSIS — J9622 Acute and chronic respiratory failure with hypercapnia: Secondary | ICD-10-CM

## 2017-03-07 DIAGNOSIS — R4182 Altered mental status, unspecified: Secondary | ICD-10-CM

## 2017-03-07 DIAGNOSIS — J42 Unspecified chronic bronchitis: Secondary | ICD-10-CM

## 2017-03-07 DIAGNOSIS — J9612 Chronic respiratory failure with hypercapnia: Secondary | ICD-10-CM

## 2017-03-07 LAB — BASIC METABOLIC PANEL
ANION GAP: 6 (ref 5–15)
BUN: 31 mg/dL — ABNORMAL HIGH (ref 6–20)
CO2: 40 mmol/L — ABNORMAL HIGH (ref 22–32)
Calcium: 8.7 mg/dL — ABNORMAL LOW (ref 8.9–10.3)
Chloride: 97 mmol/L — ABNORMAL LOW (ref 101–111)
Creatinine, Ser: 0.48 mg/dL (ref 0.44–1.00)
GFR calc Af Amer: >60 mL/min (ref >60)
Glucose, Bld: 118 mg/dL — ABNORMAL HIGH (ref 65–99)
POTASSIUM: 3.8 mmol/L (ref 3.5–5.1)
SODIUM: 143 mmol/L (ref 135–145)

## 2017-03-07 LAB — BLOOD GAS, ARTERIAL
Acid-Base Excess: 14.1 mmol/L — ABNORMAL HIGH (ref 0.0–2.0)
Bicarbonate: 39.9 mmol/L — ABNORMAL HIGH (ref 20.0–28.0)
DRAWN BY: 27533
FIO2: 40
O2 SAT: 97.5 %
PCO2 ART: 69.4 mmHg — AB (ref 32.0–48.0)
PEEP: 5 cmH2O
Patient temperature: 98.6
RATE: 16 resp/min
VT: 420 mL
pH, Arterial: 7.378 (ref 7.350–7.450)
pO2, Arterial: 89.5 mmHg (ref 83.0–108.0)

## 2017-03-07 LAB — CBC WITH DIFFERENTIAL/PLATELET
BASOS ABS: 0 10*3/uL (ref 0.0–0.1)
Basophils Relative: 0 %
Eosinophils Absolute: 0.1 10*3/uL (ref 0.0–0.7)
Eosinophils Relative: 0 %
HEMATOCRIT: 33.4 % — AB (ref 36.0–46.0)
HEMOGLOBIN: 10 g/dL — AB (ref 12.0–15.0)
LYMPHS PCT: 8 %
Lymphs Abs: 1.4 10*3/uL (ref 0.7–4.0)
MCH: 30.7 pg (ref 26.0–34.0)
MCHC: 29.9 g/dL — ABNORMAL LOW (ref 30.0–36.0)
MCV: 102.5 fL — AB (ref 78.0–100.0)
MONOS PCT: 13 %
Monocytes Absolute: 2.3 10*3/uL — ABNORMAL HIGH (ref 0.1–1.0)
NEUTROS ABS: 14.4 10*3/uL — AB (ref 1.7–7.7)
NEUTROS PCT: 79 %
Platelets: 531 10*3/uL — ABNORMAL HIGH (ref 150–400)
RBC: 3.26 MIL/uL — ABNORMAL LOW (ref 3.87–5.11)
RDW: 12.5 % (ref 11.5–15.5)
WBC: 18.2 10*3/uL — ABNORMAL HIGH (ref 4.0–10.5)

## 2017-03-07 LAB — GLUCOSE, CAPILLARY
GLUCOSE-CAPILLARY: 146 mg/dL — AB (ref 65–99)
GLUCOSE-CAPILLARY: 153 mg/dL — AB (ref 65–99)
GLUCOSE-CAPILLARY: 204 mg/dL — AB (ref 65–99)
GLUCOSE-CAPILLARY: 28 mg/dL — AB (ref 65–99)
GLUCOSE-CAPILLARY: 74 mg/dL (ref 65–99)
GLUCOSE-CAPILLARY: 91 mg/dL (ref 65–99)
Glucose-Capillary: 167 mg/dL — ABNORMAL HIGH (ref 65–99)

## 2017-03-07 LAB — PHOSPHORUS: PHOSPHORUS: 3.5 mg/dL (ref 2.5–4.6)

## 2017-03-07 LAB — MAGNESIUM: Magnesium: 1.8 mg/dL (ref 1.7–2.4)

## 2017-03-07 MED ORDER — ACETAMINOPHEN 160 MG/5ML PO SOLN: 650.0000 mg | Freq: Four times a day (QID) | ORAL | Status: DC | PRN

## 2017-03-07 MED ORDER — CLONAZEPAM 1 MG PO TABS
1.0000 mg | ORAL_TABLET | Freq: Two times a day (BID) | ORAL | Status: DC
  Administered 2017-03-08: 1 mg
  Filled 2017-03-07: qty 1

## 2017-03-07 NOTE — Progress Notes (Signed)
SLP Cancellation Note  Patient Details Name: Megan [name] MRN: [id number] DOB: [birth year]   Cancelled treatment:       Reason Eval/Treat Not Completed: Medical issues which prohibited therapy.    Lawrie Tunks, Katherene Ponto 03/07/2017, 1:39 PM

## 2017-03-07 NOTE — Progress Notes (Addendum)
Nutrition Follow Up  DOCUMENTATION CODES:   Non-severe (moderate) malnutrition in context of chronic illness, Underweight  INTERVENTION:    Continue Vital AF 1.2 formula at goal rate of 40 ml/hr with Prostat liquid protein 30 ml daily  TF regimen providing 1252 kcal, 87 gm protein, 779 ml free water daily  NUTRITION DIAGNOSIS:   Malnutrition (moderate) related to chronic illness (COPD) as evidenced by mild depletion of body fat, moderate depletion of body fat, mild depletion of muscle mass, moderate depletions of muscle mass, ongoing  GOAL:   Patient will meet greater than or equal to 90% of their needs, met  MONITOR:   Diet advancement, Vent status, Labs, Weight trends, TF tolerance, I & O's  ASSESSMENT:   66 year old woman with COPD on home oxygen, hypertension hospitalized for acute on chronic respiratory failure.   Patient is currently on ventilator support MV: 7.9 L/min Temp (24hrs), Avg:98.3 F (36.8 C), Min:98.1 F (36.7 C), Max:98.7 F (37.1 C)  Pt s/p bedside trach placement 5/2. Vital High Protein formula infusing at goal rate of 40 ml/hr via CORTRAK small bore feeding tube. Also receiving Prostat liquid protein 30 ml daily via tube. Labs and medications reviewed.   CBG's [id number].  Diet Order:  Diet NPO time specified  Skin:  Reviewed, no issues  Last BM:  5/1  Height:   Ht Readings from Last 1 Encounters:  02/20/17 '5\' 3"'  (1.6 m)   Weight:   Wt Readings from Last 1 Encounters:  03/07/17 95 lb 4.8 oz (43.2 kg)   Ideal Body Weight:  52.3 kg  BMI:  Body mass index is 16.88 kg/m.  Estimated Nutritional Needs:   Kcal:  1200  Protein:  75-90 gm  Fluid:  >/= 1.5 L  EDUCATION NEEDS:   No education needs identified at this time  Arthur Holms, RD, LDN Pager #: [phone] After-Hours Pager #: [phone]

## 2017-03-07 NOTE — Care Management Note (Addendum)
Case Management Note  Patient Details  Name: Megan [name] MRN: [id number] Date of Birth: [birth year]  Subjective/Objective:    Pt admitted with rhino and enterovirus                             Action/Plan: PTA from home but per daughter - pt is  not able to perform most ADL's.  Pt is now on ventilator - now DNR.   Pt has extensive respiratory history .  CM will continue to follow for discharge needs   Expected Discharge Date:                  Expected Discharge Plan:  Long Term Acute Care (LTAC)  In-House Referral:  Clinical Social Work  Discharge planning Services  CM Consult  Post Acute Care Choice:    Choice offered to:     DME Arranged:    DME Agency:     HH Arranged:    [place] Agency:     Status of Service:  In process, will continue to follow  If discussed at Long Length of Stay Meetings, dates discussed:    Additional Comments: 03/03/2017 Discussed in LOS 03/03/17 - appropriate for continued stay.  Trached yesterday and on TC today, IV lasix and tube feeds.  CM continuing to follow for possible LTACH referral but currently more appropriate for SNF - physician advisor in agreement  03/07/17 1634- NCM spoke with Daughter Randel Pigg, NCM offered choice for [facility] or [facility], she states she would rather patient stay here in [facility].  NCM made referral to [facility] with [facility].  5/8 [place], BSN- patient is for dc to [facility] today.  Zenon Mayo, RN 03/07/2017, 4:32 PM

## 2017-03-07 NOTE — Progress Notes (Signed)
Placed pt on ventilator due to decreased LOC and ABG results.  CCM contacted per Dr. Thereasa Solo.

## 2017-03-07 NOTE — Progress Notes (Signed)
PT Cancellation Note  Patient Details Name: Megan [name] MRN: [id number] DOB: 01-May-1951   Cancelled Treatment:    Reason Eval/Treat Not Completed: Patient not medically ready; patient with decreased LOC and abnormal ABG and placed back on vent this am.  Will attempt another day.   Reginia Naas 03/07/2017, 11:16 AM  Magda Kiel, [place] 03/07/2017

## 2017-03-07 NOTE — Progress Notes (Signed)
Text message sent to Dr Thereasa Solo about change in patient LOC in [place] tinge sputm.

## 2017-03-07 NOTE — Progress Notes (Signed)
[name]  Megan [name]  RCV:[id number] DOB: [birth year] DOA: 02/20/2017 PCP: [facility], FNP    Brief Narrative:  66 year old female with history of severe COPD, anxiety, depression, hypertension, and GERD who came to the ER with complaints of several days of worsening dyspnea. Due to severe hypercarbic respiratory failure she was intubated and started on Solu-Medrol 60 mg, ceftriaxone and azithromycin. Due to initial tachycardia she was started on a Cardizem drip. In the ED she also received adenosine 6 mg and then 10 mg for concerns of SVT. Her antibiotics were switched to cefepime on 02/21/2017 and she completed a 7 day course for Strep pneumonia. Due to acute metabolic encephalopathy CT of the head was done which was negative for acute findings. Sputum cultures were positive for rhinovirus/enterovirus. After discussion with family for one-way extubation versus tracheostomy, family members opted for tracheostomy therefore it was placed on 03/02/2017.   Subjective: The patient was found to be significantly less responsive by her nurse today.  An ABG revealed a PCO2 of 94.  The patient was placed back on the ventilator.  At the time of my exam the patient is stable on the ventilator but remains sedate.  She will open her eyes but does not engage with the examiner or follow the examiner around the room.  Assessment & Plan:  Acute recurrent on chronic hypercapnic respiratory failure - severe COPD with acute exacerbation - rhinovirus/enterovirus pneumonia - Streptococcus pneumonia pneumonia Exact etiology of decline today not clear - ?sedating meds - cont vent support - PCCM to to f/u - CXR pending   Pulmonary edema No suggestion of volume overload on exam - f/u TTE results   Acute kidney injury Resolved with normal creatinine at this time  Anxiety with depression and PTSD Witnessed son commit suicide with a firearm - unable to provide history due to obtundation  at present  Non-severe (moderate) malnutrition in context of chronic illness, Underweight  Macrocytic anemia Check Z-02 and folic acid levels  DVT prophylaxis: lovenox  Code Status: FULL CODE Family Communication: no family present at time of exam  Disposition Plan: SDU  Consultants:  PCCM  Procedures: 4/22 admit by PCCM - intubated  5/2 tracheostomy per Nelda Marseille  5/4 TRH assumed care   Antimicrobials:  None presently    Objective: Blood pressure (!) 129/57, pulse (!) 122, temperature 98.1 F (36.7 C), temperature source Oral, resp. rate (!) 26, height '5\' 3"'  (1.6 m), weight 43.2 kg (95 lb 4.8 oz), SpO2 94 %.  Intake/Output Summary (Last 24 hours) at 03/07/17 1110 Last data filed at 03/07/17 0358  Gross per 24 hour  Intake                0 ml  Output             1050 ml  Net            -1050 ml   Filed Weights   03/05/17 0419 03/06/17 0500 03/07/17 0419  Weight: 48.9 kg (107 lb 12.8 oz) 43.1 kg (95 lb 1.6 oz) 43.2 kg (95 lb 4.8 oz)    Examination: General: No acute respiratory distress on vent - obtunded  Lungs: Clear to auscultation bilaterally without wheezes or crackles Cardiovascular: Tachycardic but regular without appreciable murmur or gallop Abdomen: Nontender, nondistended, soft, bowel sounds positive, no rebound, no ascites, no appreciable mass Extremities: No significant cyanosis, clubbing, or edema bilateral lower extremities  CBC:  Recent Labs Lab 03/03/17 0420  03/04/17 0459 03/05/17 0356 03/06/17 0432 03/07/17 0425  WBC 10.0 17.1* 17.7* 17.9* 18.2*  NEUTROABS 7.4 14.1* 14.0* 14.5* 14.4*  HGB 9.1* 9.8* 10.3* 10.0* 10.0*  HCT 30.1* 32.6* 32.6* 32.3* 33.4*  MCV 101.3* 101.6* 98.8 100.3* 102.5*  PLT 293 341 359 384 707*   Basic Metabolic Panel:  Recent Labs Lab 03/03/17 0420 03/04/17 0459 03/05/17 0356 03/06/17 0432 03/07/17 0425  NA 142 145 141 140 143  K 3.8 3.5 3.3* 4.0 3.8  CL 97* 98* 95* 96* 97*  CO2 38* 38* 37* 38* 40*  GLUCOSE  158* 153* 191* 167* 118*  BUN 42* 34* 30* 29* 31*  CREATININE 0.67 0.55 0.58 0.54 0.48  CALCIUM 8.5* 8.5* 8.6* 8.7* 8.7*  MG 2.1 1.9 1.9 2.0 1.8  PHOS 3.7 3.1 3.3 3.4 3.5   GFR: Estimated Creatinine Clearance: 48.5 mL/min (by C-G formula based on SCr of 0.48 mg/dL).  Liver Function Tests: No results for input(s): AST, ALT, ALKPHOS, BILITOT, PROT, ALBUMIN in the last 168 hours. No results for input(s): LIPASE, AMYLASE in the last 168 hours. No results for input(s): AMMONIA in the last 168 hours.  Coagulation Profile:  Recent Labs Lab 03/02/17 0430  INR 0.97   HbA1C: Hgb A1c MFr Bld  Date/Time Value Ref Range Status  01/05/2017 08:42 AM 5.3 4.8 - 5.6 % Final    Comment:    (NOTE)         Pre-diabetes: 5.7 - 6.4         Diabetes: >6.4         Glycemic control for adults with diabetes: <7.0     CBG:  Recent Labs Lab 03/06/17 1630 03/06/17 2012 03/07/17 0005 03/07/17 0353 03/07/17 0737  GLUCAP 81 154* 146* 91 204*     Scheduled Meds: . budesonide (PULMICORT) nebulizer solution  0.5 mg Nebulization BID  . chlorhexidine gluconate (MEDLINE KIT)  15 mL Mouth Rinse BID  . Chlorhexidine Gluconate Cloth  6 each Topical Daily  . clonazePAM  1 mg Oral BID  . clonazePAM  1 mg Oral Once  . enoxaparin (LOVENOX) injection  30 mg Subcutaneous Q24H  . feeding supplement (PRO-STAT SUGAR FREE 64)  30 mL Per Tube Daily  . insulin aspart  0-20 Units Subcutaneous Q4H  . levalbuterol  1.25 mg Nebulization Q6H  . mouth rinse  15 mL Mouth Rinse QID  . pantoprazole sodium  40 mg Per Tube Daily  . predniSONE  10 mg Oral Q breakfast   Followed by  . [START ON 03/09/2017] predniSONE  5 mg Oral Q breakfast  . sennosides  5 mL Per Tube BID  . sertraline  25 mg Oral Daily  . sodium chloride flush  10-40 mL Intracatheter Q12H   Continuous Infusions: . sodium chloride 250 mL (03/01/17 2000)  . sodium chloride 10 mL/hr at 03/03/17 0600  . feeding supplement (VITAL AF 1.2 CAL) 1,000 mL  (03/06/17 1649)     LOS: 15 days   Cherene Altes, MD [facility] Office  [phone] Pager - Text Page per Amion as per below:  On-Call/Text Page:      [email or web address]      password [email or web address]  If 7PM-7AM, please contact night-coverage [email or web address] Password [email or web address] 03/07/2017, 11:10 AM

## 2017-03-07 NOTE — Progress Notes (Signed)
Name: Megan [name] MRN: [id number] DOB: 20-Aug-1951    ADMISSION DATE:  02/20/2017  REFERRING MD :  EDP  CHIEF COMPLAINT:  Shortness of Breath   BRIEF SUMMARY:  66 y/o F with PMH of COPD on home O2, HTN who was admitted on 4/22 with acute on chronic respiratory failure in the setting of rhinovirus/enterovirus with strep pneumo PNA, AECOPD and pulmonary edema.  Prolonged hospitalization complicated by AKI, inability to wean from mechanical ventilation requiring tracheostomy.    SUBJECTIVE:  RT reports pt became hypercarbic requiring mechanical ventilation, ABG improved on vent.  Of note, pt received '2mg'$  versed while off vent.    VITAL SIGNS: Temp:  [98.1 F (36.7 C)-99.5 F (37.5 C)] 99.5 F (37.5 C) (05/07 1153) Pulse Rate:  [72-122] 122 (05/07 0804) Resp:  [21-26] 26 (05/07 0804) BP: (119-135)/(56-62) 129/57 (05/07 0738) SpO2:  [91 %-98 %] 94 % (05/07 0804) FiO2 (%):  [30 %-40 %] 40 % (05/07 1326) Weight:  [95 lb 4.8 oz (43.2 kg)] 95 lb 4.8 oz (43.2 kg) (05/07 0419)  PHYSICAL EXAMINATION: General: frail, thin adult female in NAD on vent  HEENT: MM pink/moist, Trach midline, #6 c/d/i, sutures in place Neuro: opens eyes to voice, follows commands, drowsy CV: s1s2 rrr, no m/r/g PULM: even/non-labored, lungs bilaterally diminished breath sounds bilaterally, no wheezing  NI:DPOE, non-tender, bsx4 active  Extremities: warm/dry, no edema  Skin: no rashes or lesions    Recent Labs Lab 03/05/17 0356 03/06/17 0432 03/07/17 0425  NA 141 140 143  K 3.3* 4.0 3.8  CL 95* 96* 97*  CO2 37* 38* 40*  BUN 30* 29* 31*  CREATININE 0.58 0.54 0.48  GLUCOSE 191* 167* 118*     Recent Labs Lab 03/05/17 0356 03/06/17 0432 03/07/17 0425  HGB 10.3* 10.0* 10.0*  HCT 32.6* 32.3* 33.4*  WBC 17.7* 17.9* 18.2*  PLT 359 384 531*    Dg Chest Port 1 View  Result Date: 03/07/2017 CLINICAL DATA:  Respiratory failure with hypoxia. EXAM: PORTABLE CHEST 1 VIEW COMPARISON:  03/04/2017,  02/25/2016 and CT chest 01/03/2017. FINDINGS: Tracheostomy is midline. Feeding tube is followed into the distal esophagus with the tip projecting beyond the inferior margin of the image. Right PICC tip projects over the SVC. Heart is normal in size. Lungs are hyperinflated. Biapical pleural thickening. Nipple shadows project over the mid hemithoraces bilaterally. Tiny left pleural effusion. IMPRESSION: Hyperinflation with a tiny left pleural effusion. Electronically Signed   By: Lorin Picket M.D.   On: 03/07/2017 11:13      SIGNIFICANT EVENTS  4/22  Admit with resp fx, intubated  4/23  Acute encephalopathy, CT head negative  5/02  Trach  STUDIES:  RVP 4/23 >> positive for RSV, enterovirus  CULTURES:   ANTIBIOTICS: Vanco 4/22 >> 4/25  Azithro 4/22 >> 4/25  Ceftriaxone 4/22 x1  Cefepime 4/23 >> 4/29   ASSESSMENT / PLAN:  Discussion:  66 y/o F admitted 4/22 with acute on chronic respiratory failure in the setting of rhinovirus/enterovirus with strep pneumo PNA, AECOPD and pulmonary edema.  Prolonged hospitalization complicated by AKI, inability to wean from mechanical ventilation requiring tracheostomy.  Acute on Chronic Hypercapnic Respiratory Failure - in the setting of AECOPD with severe exacerbation, rhinovirus / enterovirus with strep pneumonia and pulmonary edema.  Acute episode of hypercarbia after given versed while off the vent.    Tracheostomy Status - secondary to above   Plan:  PRVC 8 cc/kg for now Work toward getting patient back to ATC  Continue Pulmicort Continue xopenex QID + PRN Discontinue versed from [facility]  Continue prednisone taper, 10 mg QD Intermittent CXR  Minimize sedation  Clip trach sutures 5/10 Push PT efforts   Noe Gens, NP-C  Pulmonary & Critical Care Pgr: [phone] or if no answer [phone] 03/07/2017, 3:24 PM

## 2017-03-08 ENCOUNTER — Inpatient Hospital Stay
Admission: RE | Admit: 2017-03-08 | Discharge: 2017-04-04 | Disposition: A | Discharge status: Skilled Nursing Facility | Payer: Medicare Other | Source: Ambulatory Visit | Attending: Internal Medicine | Admitting: Internal Medicine

## 2017-03-08 ENCOUNTER — Other Ambulatory Visit (HOSPITAL_COMMUNITY): Payer: Medicare Other

## 2017-03-08 ENCOUNTER — Institutional Professional Consult (permissible substitution) (HOSPITAL_COMMUNITY): Payer: Medicare Other

## 2017-03-08 DIAGNOSIS — Z720 Tobacco use: Secondary | ICD-10-CM | POA: Diagnosis not present

## 2017-03-08 DIAGNOSIS — R000 Tachycardia, unspecified: Secondary | ICD-10-CM | POA: Diagnosis not present

## 2017-03-08 DIAGNOSIS — I509 Heart failure, unspecified: Secondary | ICD-10-CM | POA: Diagnosis not present

## 2017-03-08 DIAGNOSIS — N170 Acute kidney failure with tubular necrosis: Secondary | ICD-10-CM | POA: Diagnosis not present

## 2017-03-08 DIAGNOSIS — J9610 Chronic respiratory failure, unspecified whether with hypoxia or hypercapnia: Secondary | ICD-10-CM | POA: Diagnosis not present

## 2017-03-08 DIAGNOSIS — Z930 Tracheostomy status: Secondary | ICD-10-CM | POA: Diagnosis not present

## 2017-03-08 DIAGNOSIS — M6281 Muscle weakness (generalized): Secondary | ICD-10-CM | POA: Diagnosis not present

## 2017-03-08 DIAGNOSIS — J9690 Respiratory failure, unspecified, unspecified whether with hypoxia or hypercapnia: Secondary | ICD-10-CM | POA: Diagnosis not present

## 2017-03-08 DIAGNOSIS — Z794 Long term (current) use of insulin: Secondary | ICD-10-CM | POA: Diagnosis not present

## 2017-03-08 DIAGNOSIS — Z4659 Encounter for fitting and adjustment of other gastrointestinal appliance and device: Secondary | ICD-10-CM

## 2017-03-08 DIAGNOSIS — Z9911 Dependence on respirator [ventilator] status: Secondary | ICD-10-CM | POA: Diagnosis not present

## 2017-03-08 DIAGNOSIS — J9621 Acute and chronic respiratory failure with hypoxia: Secondary | ICD-10-CM | POA: Diagnosis not present

## 2017-03-08 DIAGNOSIS — F418 Other specified anxiety disorders: Secondary | ICD-10-CM | POA: Diagnosis not present

## 2017-03-08 DIAGNOSIS — J9600 Acute respiratory failure, unspecified whether with hypoxia or hypercapnia: Secondary | ICD-10-CM | POA: Diagnosis not present

## 2017-03-08 DIAGNOSIS — J80 Acute respiratory distress syndrome: Secondary | ICD-10-CM | POA: Diagnosis not present

## 2017-03-08 DIAGNOSIS — E119 Type 2 diabetes mellitus without complications: Secondary | ICD-10-CM | POA: Diagnosis present

## 2017-03-08 DIAGNOSIS — F339 Major depressive disorder, recurrent, unspecified: Secondary | ICD-10-CM | POA: Diagnosis not present

## 2017-03-08 DIAGNOSIS — K219 Gastro-esophageal reflux disease without esophagitis: Secondary | ICD-10-CM | POA: Diagnosis present

## 2017-03-08 DIAGNOSIS — J181 Lobar pneumonia, unspecified organism: Secondary | ICD-10-CM | POA: Diagnosis not present

## 2017-03-08 DIAGNOSIS — Z430 Encounter for attention to tracheostomy: Secondary | ICD-10-CM | POA: Diagnosis not present

## 2017-03-08 DIAGNOSIS — J441 Chronic obstructive pulmonary disease with (acute) exacerbation: Secondary | ICD-10-CM | POA: Diagnosis not present

## 2017-03-08 DIAGNOSIS — E46 Unspecified protein-calorie malnutrition: Secondary | ICD-10-CM | POA: Diagnosis present

## 2017-03-08 DIAGNOSIS — J9602 Acute respiratory failure with hypercapnia: Secondary | ICD-10-CM | POA: Diagnosis not present

## 2017-03-08 DIAGNOSIS — I110 Hypertensive heart disease with heart failure: Secondary | ICD-10-CM | POA: Diagnosis not present

## 2017-03-08 DIAGNOSIS — I10 Essential (primary) hypertension: Secondary | ICD-10-CM | POA: Diagnosis present

## 2017-03-08 DIAGNOSIS — F411 Generalized anxiety disorder: Secondary | ICD-10-CM | POA: Diagnosis not present

## 2017-03-08 DIAGNOSIS — J449 Chronic obstructive pulmonary disease, unspecified: Secondary | ICD-10-CM | POA: Diagnosis not present

## 2017-03-08 DIAGNOSIS — J189 Pneumonia, unspecified organism: Secondary | ICD-10-CM | POA: Diagnosis not present

## 2017-03-08 DIAGNOSIS — R1310 Dysphagia, unspecified: Secondary | ICD-10-CM | POA: Diagnosis present

## 2017-03-08 DIAGNOSIS — Z4682 Encounter for fitting and adjustment of non-vascular catheter: Secondary | ICD-10-CM | POA: Diagnosis not present

## 2017-03-08 LAB — CBC
HCT: 29.7 % — ABNORMAL LOW (ref 36.0–46.0)
Hemoglobin: 8.9 g/dL — ABNORMAL LOW (ref 12.0–15.0)
MCH: 30.3 pg (ref 26.0–34.0)
MCHC: 30 g/dL (ref 30.0–36.0)
MCV: 101 fL — AB (ref 78.0–100.0)
PLATELETS: 416 10*3/uL — AB (ref 150–400)
RBC: 2.94 MIL/uL — ABNORMAL LOW (ref 3.87–5.11)
RDW: 12.8 % (ref 11.5–15.5)
WBC: 9.5 10*3/uL (ref 4.0–10.5)

## 2017-03-08 LAB — GLUCOSE, CAPILLARY
GLUCOSE-CAPILLARY: 154 mg/dL — AB (ref 65–99)
Glucose-Capillary: 152 mg/dL — ABNORMAL HIGH (ref 65–99)
Glucose-Capillary: 195 mg/dL — ABNORMAL HIGH (ref 65–99)
Glucose-Capillary: 171 mg/dL — ABNORMAL HIGH (ref 65–99)
Glucose-Capillary: 169 mg/dL — ABNORMAL HIGH (ref 65–99)

## 2017-03-08 LAB — BLOOD GAS, ARTERIAL
ACID-BASE EXCESS: 12.4 mmol/L — AB (ref 0.0–2.0)
BICARBONATE: 39.8 mmol/L — AB (ref 20.0–28.0)
Drawn by: 275531
FIO2: 40
O2 SAT: 89.6 %
PATIENT TEMPERATURE: 98.6
PO2 ART: 62.7 mmHg — AB (ref 83.0–108.0)
pCO2 arterial: 94.2 mmHg (ref 32.0–48.0)
pH, Arterial: 7.249 — ABNORMAL LOW (ref 7.350–7.450)

## 2017-03-08 LAB — COMPREHENSIVE METABOLIC PANEL
ALBUMIN: 2.3 g/dL — AB (ref 3.5–5.0)
ALK PHOS: 82 U/L (ref 38–126)
ALT: 17 U/L (ref 14–54)
AST: 14 U/L — AB (ref 15–41)
Anion gap: 8 (ref 5–15)
BUN: 34 mg/dL — ABNORMAL HIGH (ref 6–20)
CALCIUM: 8.6 mg/dL — AB (ref 8.9–10.3)
CHLORIDE: 94 mmol/L — AB (ref 101–111)
CO2: 40 mmol/L — AB (ref 22–32)
CREATININE: 0.59 mg/dL (ref 0.44–1.00)
GFR calc non Af Amer: >60 mL/min (ref >60)
GLUCOSE: 182 mg/dL — AB (ref 65–99)
Potassium: 3.6 mmol/L (ref 3.5–5.1)
SODIUM: 142 mmol/L (ref 135–145)
Total Bilirubin: 0.4 mg/dL (ref 0.3–1.2)
Total Protein: 5.7 g/dL — ABNORMAL LOW (ref 6.5–8.1)

## 2017-03-08 LAB — IRON AND TIBC
IRON: 24 ug/dL — AB (ref 28–170)
Saturation Ratios: 11 % (ref 10.4–31.8)
TIBC: 213 ug/dL — AB (ref 250–450)
UIBC: 189 ug/dL

## 2017-03-08 LAB — FOLATE: FOLATE: 32.1 ng/mL (ref >5.9)

## 2017-03-08 LAB — RETICULOCYTES
RBC.: 2.94 MIL/uL — ABNORMAL LOW (ref 3.87–5.11)
Retic Count, Absolute: 35.3 10*3/uL (ref 19.0–186.0)
Retic Ct Pct: 1.2 % (ref 0.4–3.1)

## 2017-03-08 LAB — FERRITIN: FERRITIN: 186 ng/mL (ref 11–307)

## 2017-03-08 LAB — VITAMIN B12: Vitamin B-12: 1244 pg/mL — ABNORMAL HIGH (ref 180–914)

## 2017-03-08 MED ORDER — SERTRALINE HCL 25 MG PO TABS: 25.0000 mg | ORAL_TABLET | Freq: Every day | ORAL | 0 refills | Status: DC

## 2017-03-08 MED ORDER — LEVALBUTEROL HCL 0.63 MG/3ML IN NEBU: 0.6300 mg | INHALATION_SOLUTION | RESPIRATORY_TRACT | 12 refills | Status: DC | PRN

## 2017-03-08 MED ORDER — LEVALBUTEROL HCL 1.25 MG/0.5ML IN NEBU: 1.2500 mg | INHALATION_SOLUTION | Freq: Four times a day (QID) | RESPIRATORY_TRACT | 12 refills | Status: DC

## 2017-03-08 MED ORDER — PREDNISONE 5 MG PO TABS: 5.0000 mg | ORAL_TABLET | Freq: Every day | ORAL | 0 refills | Status: DC

## 2017-03-08 MED ORDER — CLONAZEPAM 1 MG PO TABS: 1.0000 mg | ORAL_TABLET | Freq: Two times a day (BID) | ORAL | 0 refills | Status: DC

## 2017-03-08 MED ORDER — BUDESONIDE 0.5 MG/2ML IN SUSP: 0.5000 mg | Freq: Two times a day (BID) | RESPIRATORY_TRACT | 0 refills | Status: DC

## 2017-03-08 NOTE — Progress Notes (Signed)
SLP Cancellation Note  Patient Details Name: Megan [name] MRN: [id number] DOB: 12/24/1950   Cancelled treatment:       Reason Eval/Treat Not Completed: Will await ATC for PMV trials - pt has been having difficulty tolerating PMV. Will follow for readiness.   [name] 03/08/2017, 10:28 AM

## 2017-03-08 NOTE — Evaluation (Signed)
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  She was intubated on admission.  Had period of SVT and metabolic encephalopathy.  Underwent tracheostomy on 03/02/17.  Clinical Impression  Patient presents with decreased mobility due to weakness and limited tolerance to activity.  She mobilizes in bed under her own power, but demonstrates decreased balance and fear/anxiety due to limited respiratory reserve even on pressure support on vent.  She will benefit from skilled PT in the next venue to progress tolerance and independence prior to d/c home.     Follow Up Recommendations [facility]    Equipment Recommendations  Other (comment) (TBA)    Recommendations for Other Services       Precautions / Restrictions Precautions Precautions: Fall Precaution Comments: trach      Mobility  Bed Mobility Overal bed mobility: Modified Independent             General bed mobility comments: just managed lines for her  Transfers Overall transfer level: Needs assistance Equipment used: Rolling walker (2 wheeled) Transfers: Sit to/from Stand Sit to Stand: Min assist         General transfer comment: for balance/safety  Ambulation/Gait Ambulation/Gait assistance: Min assist Ambulation Distance (Feet): 0 Feet (standing holding walker marched in place x 10 reps due to on vent) Assistive device: Rolling walker (2 wheeled)       General Gait Details: quick to race through marching due to fatigue and reportedly nervous  Stairs            Wheelchair Mobility    Modified Rankin (Stroke Patients Only)       Balance Overall balance assessment: Needs assistance   Sitting balance-Leahy Scale: Good     Standing balance support: Bilateral upper extremity supported Standing balance-Leahy Scale: Poor Standing balance comment: UE support due to weakness                             Pertinent Vitals/Pain Faces Pain Scale: Hurts even more Pain Location: trach site Pain Descriptors / Indicators: Grimacing Pain Intervention(s): Monitored during session;Repositioned    Home Living Family/patient expects to be discharged to:: Private residence Living Arrangements: Children Available Help at Discharge: Family;Available 24 hours/day Type of Home: House Home Access: Level entry     Home Layout: One level Home Equipment: Cane - single point;Walker - 4 wheels Additional Comments: information from previous admission due to trach    Prior Function Level of Independence: Independent               Hand Dominance   Dominant Hand: Right    Extremity/Trunk Assessment   Upper Extremity Assessment Upper Extremity Assessment: Generalized weakness    Lower Extremity Assessment Lower Extremity Assessment: Generalized weakness       Communication   Communication: No difficulties  Cognition Arousal/Alertness: Awake/alert Behavior During Therapy: Anxious Overall Cognitive Status: Within Functional Limits for tasks assessed                                 General Comments: writing to communicate her needs well  General Comments      Exercises     Assessment/Plan    PT Assessment All further PT needs can be met in the next venue of care  PT Problem List Decreased strength;Decreased mobility;Decreased activity tolerance;Cardiopulmonary status limiting activity;Decreased balance;Decreased knowledge of use of DME       PT Treatment Interventions      PT Goals (Current goals can be found in the Care Plan section)  Acute Rehab PT Goals PT Goal  Formulation: All assessment and education complete, DC therapy    Frequency     Barriers to discharge        Co-evaluation               AM-PAC PT "6 Clicks" Daily Activity  Outcome Measure Difficulty turning over in bed (including adjusting bedclothes, sheets and blankets)?: None Difficulty moving from lying on back to sitting on the side of the bed? : None Difficulty sitting down on and standing up from a chair with arms (e.g., wheelchair, bedside commode, etc,.)?: Total Help needed moving to and from a bed to chair (including a wheelchair)?: A Little Help needed walking in hospital room?: A Little Help needed climbing 3-5 steps with a railing? : A Lot 6 Click Score: 17    End of Session Equipment Utilized During Treatment: Gait belt;Other (comment) (vent) Activity Tolerance: Patient limited by fatigue Patient left: in bed;with call bell/phone within reach   PT Visit Diagnosis: Muscle weakness (generalized) (M62.81);Other abnormalities of gait and mobility (R26.89)    Time: 0076-2263 PT Time Calculation (min) (ACUTE ONLY): 25 min   Charges:   PT Evaluation $PT Eval High Complexity: 1 Procedure PT Treatments $Therapeutic Activity: 8-22 mins   PT G CodesMagda Kiel, [place] [phone] 03/08/2017   Reginia Naas 03/08/2017, 4:28 PM

## 2017-03-08 NOTE — Progress Notes (Signed)
Foley discontinued: patient had no order placed for a foley catheter, and is no longer no aggressive diuresis. Patient tolerated removal well, with no signs of skin breakdown or injury.

## 2017-03-08 NOTE — Discharge Summary (Signed)
Physician Discharge Summary  Quaniya Damas HQI:[id number] DOB: [birth year] DOA: 02/20/2017  PCP: Golden Circle, FNP  Admit date: 02/20/2017 Discharge date: 03/08/2017  Admitted From: Home Disposition:  LTAC  Recommendations for Outpatient Follow-up:  1. Follow up with PCP in 1-2 weeks 2. Slow Steroid Taper. Restart IV if needed  3. Klonopin '1mg'$  orally bid 4. Morphine as needed for pain, caution with breathing status   Home Health: No Equipment/Devices: Trach - on Vent/TC  Discharge Condition: stable CODE STATUS: Full Diet recommendation: Heart Healthy / Carb Modified   Brief/Interim Summary: 66 year old female with past medical history of severe COPD, anxiety, depression, hypertension, GERD came to the ER with complaints of several days of worsening dyspnea. Due to severe hypercarbic respiratory failure she was intubated and started on Solu-Medrol 60 mg, ceftriaxone and azithromycin. Due to initial tachycardia she was also started on Cardizem drip. In the ED she also received adenosine 6 mg and then 10 mg for concerns of SVT. Her antibiotics were switched to cefepime on 02/21/2017 and completed the 7 day course for strep pneumonia. Due to acute metabolic encephalopathy CT of the head was done which was negative for any acute findings. Sputum cultures were also positive for rhinovirus/enterovirus. After discussion with family for one-way extubation versus tracheostomy, family members opted for tracheostomy therefore it was placed on 03/02/2017. At this time she continues to receive metoprolol for tachycardia and currently on oral prednisone with plans to wean over 1 week.  At this time she remains slow to wean therefore recommend continue on vent as needed otherwise trach collar during the day. Avoid over sedating medications especially benzodiazepine other than Klonopin 1 mg twice a day which she has been tolerating well. at this time she is doing well with steroid taper with necessary okay to  increase it. She is also getting 25 mg of Zoloft daily. Getting nutrition through the PEG tube. Continue speech eval as needed and perform as much physical therapy as possible to avoid deconditioning. At this time she is medically stable to be transferred to [facility] facility for further care and management.  Dis charge Diagnoses:  Active Problems:   COPD exacerbation (HCC)   Respiratory acidosis   Sepsis ([place])   Acute kidney injury ([place])   Tracheostomy, acute management ([place])   Acute on chronic respiratory failure with hypercapnia (HCC)   Tracheostomy dependence ([place])  Acute on chronic hypercarbic respiratory failure secondary to COPD and pneumonia -Acute exacerbation of COPD, rhinovirus/enterovirus pneumonia. Very slow to improve. I am not sure how much and how quickly we will make much of a progress.  -Streptococcal antigen positive -Completed the course of antibiotics cefepime at this time -Remains afebrile  Acute on chronic COPD exacerbation status post tracheostomy -Trach care per pulmonary -Loss of thick secretions, hold off on PMV. Speech following -Taper prednisone. -Continue nebulizer treatments. On Xopenex. Cont Pulmicort  Sinus tachycardia -Unknown exact etiology. Been off of Lasix  -Already on Lopressor 5 mg IV every 6 hours as needed. Caution with her respiratory status -CXR- adv COPD otherwise stable   Acute kidney injury, it is resolved at this time. We'll continue to monitor  Leukocytosis; improved  -Likely from the margination from steroid use. No signs of active infection. Status post pneumonia treatment.  Anxiety -On Klonopin increased to '1mg'$  BID.  -Continue Zoloft 25 mg daily -Versed ordered when necessary  CHFpEF -Appears to be stable at this time. Continue to monitor  Nutrition -Moderate protein calorie malnourishment. Getting nutrition through cortrack  Anemia of Chronic Dz, Hb stable cont to monitor.   Discharge Instructions   Allergies  as of 03/08/2017      Reactions   Fentanyl And Related    Behavioral changes per daughter   Penicillins Rash   Has patient had a PCN reaction causing immediate rash, facial/tongue/throat swelling, SOB or lightheadedness with hypotension: No Has patient had a PCN reaction causing severe rash involving mucus membranes or skin necrosis:NO Has patient had a PCN reaction that required hospitalization No Has patient had a PCN reaction occurring within the last 10 years:NO If all of the above answers are "NO", then may proceed with Cephalosporin use.      Medication List    TAKE these medications   albuterol 108 (90 Base) MCG/ACT inhaler Commonly known as:  PROVENTIL HFA;VENTOLIN HFA Inhale 1-2 puffs into the lungs every 6 (six) hours as needed for wheezing or shortness of breath.   ALPRAZolam 0.25 MG tablet Commonly known as:  XANAX Take 1 tablet (0.25 mg total) by mouth 2 (two) times daily as needed for anxiety.   aspirin EC 81 MG tablet Take 81 mg by mouth daily.   budesonide 0.5 MG/2ML nebulizer solution Commonly known as:  PULMICORT Take 2 mLs (0.5 mg total) by nebulization 2 (two) times daily.   butalbital-acetaminophen-caffeine 50-325-40 MG tablet Commonly known as:  FIORICET, ESGIC Take 1 tablet by mouth 2 (two) times daily as needed for headache.   clonazePAM 1 MG tablet Commonly known as:  KLONOPIN Place 1 tablet (1 mg total) into feeding tube 2 (two) times daily.   diltiazem 240 MG 24 hr capsule Commonly known as:  CARDIZEM CD Take 1 capsule (240 mg total) by mouth daily.   feeding supplement (ENSURE ENLIVE) Liqd Take 237 mLs by mouth 4 (four) times daily.   fluticasone 50 MCG/ACT nasal spray Commonly known as:  FLONASE Place 2 sprays into both nostrils daily.   Fluticasone-Salmeterol 500-50 MCG/DOSE Aepb Commonly known as:  ADVAIR Inhale 1 puff into the lungs 2 (two) times daily.   INCRUSE ELLIPTA 62.5 MCG/INH Aepb Generic drug:  umeclidinium bromide INHALE 1  PUFF INTO THE LUNGS DAILY   levalbuterol 0.63 MG/3ML nebulizer solution Commonly known as:  XOPENEX Take 3 mLs (0.63 mg total) by nebulization every 4 (four) hours as needed for wheezing. What changed:  Another medication with the same name was added. Make sure you understand how and when to take each.   levalbuterol 1.25 MG/0.5ML nebulizer solution Commonly known as:  XOPENEX Take 1.25 mg by nebulization every 6 (six) hours. What changed:  You were already taking a medication with the same name, and this prescription was added. Make sure you understand how and when to take each.   levalbuterol 45 MCG/ACT inhaler Commonly known as:  XOPENEX HFA Inhale 1-2 puffs into the lungs every 4 (four) hours as needed for wheezing.   loratadine 10 MG tablet Commonly known as:  CLARITIN Take 1 tablet (10 mg total) by mouth daily.   pantoprazole 40 MG tablet Commonly known as:  PROTONIX Take 1 tablet (40 mg total) by mouth daily at 6 (six) AM.   polyethylene glycol packet Commonly known as:  MIRALAX / GLYCOLAX Take 17 g by mouth 2 (two) times daily.   predniSONE 5 MG tablet Commonly known as:  DELTASONE Take 1 tablet (5 mg total) by mouth daily with breakfast. Start taking on:  03/09/2017   sertraline 25 MG tablet Commonly known as:  ZOLOFT Take 1 tablet (  25 mg total) by mouth daily. Start taking on:  03/09/2017       Allergies  Allergen Reactions  . Fentanyl And Related     Behavioral changes per daughter  . Penicillins Rash    Has patient had a PCN reaction causing immediate rash, facial/tongue/throat swelling, SOB or lightheadedness with hypotension: No Has patient had a PCN reaction causing severe rash involving mucus membranes or skin necrosis:NO Has patient had a PCN reaction that required hospitalization No Has patient had a PCN reaction occurring within the last 10 years:NO If all of the above answers are "NO", then may proceed with Cephalosporin use.     Consultations:  PCCM (Pulm)   Procedures/Studies: Ct Head Wo Contrast  Result Date: 02/21/2017 CLINICAL DATA:  Altered mental status. No reported injury. Tremors. Inpatient. EXAM: CT HEAD WITHOUT CONTRAST TECHNIQUE: Contiguous axial images were obtained from the base of the skull through the vertex without intravenous contrast. COMPARISON:  01/07/2017 head CT. FINDINGS: Brain: No evidence of parenchymal hemorrhage or extra-axial fluid collection. No mass lesion, mass effect, or midline shift. No CT evidence of acute infarction. Nonspecific mild subcortical and periventricular white matter hypodensity, most in keeping with chronic small vessel ischemic change. Cerebral volume is age appropriate. No ventriculomegaly. Vascular: Intracranial atherosclerosis.  No acute abnormality. Skull: No evidence of calvarial fracture. Sinuses/Orbits: The visualized paranasal sinuses are essentially clear. Other: Oral route tube enters the hypopharynx. Bilateral near complete mastoid effusions, new. IMPRESSION: 1.  No evidence of acute intracranial abnormality. 2. Mild chronic small vessel ischemia. 3. New bilateral near complete mastoid effusions. Electronically Signed   By: Ilona Sorrel M.D.   On: 02/21/2017 11:13   US Renal  Result Date: 02/22/2017 CLINICAL DATA:  Acute kidney injury.  History of hypertension. EXAM: RENAL / URINARY TRACT ULTRASOUND COMPLETE COMPARISON:  None. FINDINGS: Right Kidney: Length: 10.9 cm. Echogenicity within normal limits. No mass or hydronephrosis visualized. Left Kidney: Length: 11.8 cm. Echogenicity within normal limits. No mass or hydronephrosis visualized. Bladder: Appears normal for degree of bladder distention. Included view of the chest demonstrates bilateral pleural effusions. Small amount of ascites in the included abdomen. IMPRESSION: Normal renal sonogram. Small amount of ascites.  Bilateral pleural effusions. Electronically Signed   By: Elon Alas M.D.   On:  02/22/2017 14:03   Dg Chest Port 1 View  Result Date: 03/07/2017 CLINICAL DATA:  Respiratory failure with hypoxia. EXAM: PORTABLE CHEST 1 VIEW COMPARISON:  03/04/2017, 02/25/2016 and CT chest 01/03/2017. FINDINGS: Tracheostomy is midline. Feeding tube is followed into the distal esophagus with the tip projecting beyond the inferior margin of the image. Right PICC tip projects over the SVC. Heart is normal in size. Lungs are hyperinflated. Biapical pleural thickening. Nipple shadows project over the mid hemithoraces bilaterally. Tiny left pleural effusion. IMPRESSION: Hyperinflation with a tiny left pleural effusion. Electronically Signed   By: Lorin Picket M.D.   On: 03/07/2017 11:13   Dg Chest Port 1 View  Result Date: 03/04/2017 CLINICAL DATA:  Hypoxia EXAM: PORTABLE CHEST 1 VIEW COMPARISON:  03/02/2017 FINDINGS: COPD with pulmonary hyperinflation. Tracheostomy remains in good position. Feeding tube enters the stomach. Right arm PICC tip in the SVC unchanged. Lungs remain clear.  Next. IMPRESSION: Advanced COPD. No acute radiographic abnormality and no interval change. Electronically Signed   By: Franchot Gallo M.D.   On: 03/04/2017 10:30   Dg Chest Port 1 View  Result Date: 03/02/2017 CLINICAL DATA:  Stat post tracheostomy placement. History of COPD and CHF  EXAM: PORTABLE CHEST 1 VIEW COMPARISON:  Portable chest x-ray of February 28, 2017 FINDINGS: A tracheostomy appliance tube is present. The tip projects at the inferior margin of the clavicular heads. The lungs remain hyperinflated. Mild blunting of the costophrenic angles persists. There is no alveolar infiltrate or pneumothorax. The heart and pulmonary vascularity are normal. There is calcification in the wall of the aortic arch. The right-sided PICC line has its tip projecting over the midportion of the SVC. The observed bony thorax exhibits no acute abnormality. IMPRESSION: Positioning of the endotracheal tube is radiographically good with the tip  projecting along the inferior margin of the clavicular heads. Stable hyperinflation with hemidiaphragm flattening and costophrenic angle blunting. Electronically Signed   By: David  Martinique M.D.   On: 03/02/2017 13:04   Dg Chest Port 1 View  Result Date: 02/28/2017 CLINICAL DATA:  Acute respiratory failure, sepsis, history of COPD, EXAM: PORTABLE CHEST 1 VIEW COMPARISON:  Portable chest x-ray of February 27, 2017 FINDINGS: The lungs remain hyperinflated. There is no alveolar infiltrate. There is a tiny left pleural effusion which is stable. The heart is small but stable. The pulmonary vascularity is not engorged. The endotracheal tube tip lies 4.2 cm above the carina. The esophagogastric tubes proximal port lies above the GE junction and advancement is needed. The PICC line tip projects over the midportion of the SVC. IMPRESSION: Fairly stable appearance of the chest consistent with known COPD. Stable small right pleural effusion. Minimal subsegmental atelectasis at both bases. No alveolar pneumonia nor pulmonary edema. Advancement of the nasogastric tube by 10 cm is recommended to assure that the proximal port remains below the GE junction. Electronically Signed   By: David  Martinique M.D.   On: 02/28/2017 07:04   Dg Chest Port 1 View  Result Date: 02/27/2017 CLINICAL DATA:  Hypoxia EXAM: PORTABLE CHEST 1 VIEW COMPARISON:  February 26, 2017 FINDINGS: In endotracheal tube tip is 2.8 cm above the carina. Nasogastric tube tip and side port are below the diaphragm. There is no evident pneumothorax. There is a small left pleural effusion with mild left base atelectasis. The lungs elsewhere clear. Heart size and pulmonary vascularity are normal. No adenopathy. There is aortic atherosclerosis. No bone lesions. IMPRESSION: Tube positions as described without evident pneumothorax. Small left pleural effusion. No edema or consolidation. There is aortic atherosclerosis. Electronically Signed   By: Lowella Grip III M.D.    On: 02/27/2017 07:30   Dg Chest Port 1 View  Result Date: 02/26/2017 CLINICAL DATA:  ET tube placement. EXAM: PORTABLE CHEST 1 VIEW COMPARISON:  02/25/2017. FINDINGS: ET tube approximately 3 cm above carina. Normal cardiomediastinal silhouette. BILATERAL effusions. No pneumothorax. Bibasilar opacities persist and are stable. IMPRESSION: Stable chest. Electronically Signed   By: Staci Righter M.D.   On: 02/26/2017 07:43   Dg Chest Port 1 View  Result Date: 02/25/2017 CLINICAL DATA:  Respiratory failure. EXAM: PORTABLE CHEST 1 VIEW COMPARISON:  02/24/2017 . FINDINGS: Endotracheal tube and NG tube in stable position. Heart size normal. COPD . Bibasilar pulmonary infiltrates again noted. Slight improvement. Bilateral small pleural effusions again noted. No pneumothorax. IMPRESSION: 1. Lines and tubes in stable position. 2. Slight improvement of bibasilar pulmonary infiltrates. Small pleural effusions again noted. COPD. Electronically Signed   By: Marcello Moores  Register   On: 02/25/2017 07:11   Dg Chest Port 1 View  Result Date: 02/24/2017 CLINICAL DATA:  Acute respiratory failure. EXAM: PORTABLE CHEST 1 VIEW COMPARISON:  02/23/2017 FINDINGS: Endotracheal tube is unchanged. Enteric  tube terminates over the gastric body with side hole near the GE junction. The cardiomediastinal silhouette is unchanged. The lungs remain hyperinflated with underlying emphysema. Right greater than left basilar opacities have not significantly changed. A small right pleural effusion is again noted. There may be a tiny left pleural effusion as well, although the lateral costophrenic angle was incompletely imaged. No pneumothorax is seen. IMPRESSION: Unchanged bibasilar opacities concerning for pneumonia. Electronically Signed   By: Logan Bores M.D.   On: 02/24/2017 07:33   Dg Chest Port 1 View  Result Date: 02/23/2017 CLINICAL DATA:  Respiratory failure. EXAM: PORTABLE CHEST 1 VIEW COMPARISON:  02/22/2017. FINDINGS: Endotracheal  tube, NG tube in stable position. Heart size stable. COPD. Persistent bibasilar infiltrates are noted. Small bilateral pleural effusions noted. No pneumothorax. IMPRESSION: 1. Lines and tubes stable position. 2. Persistent bibasilar pulmonary infiltrates. No significant change. Small bilateral pleural effusions. 3. COPD. Electronically Signed   By: Marcello Moores  Register   On: 02/23/2017 06:44   Dg Chest Port 1 View  Result Date: 02/22/2017 CLINICAL DATA:  Respiratory failure EXAM: PORTABLE CHEST 1 VIEW COMPARISON:  02/20/2017 FINDINGS: Support devices are stable. There is hyperinflation of the lungs compatible with COPD. Heart is normal size. Increasing bibasilar infiltrates. Small right effusion. IMPRESSION: COPD.  Increasing bibasilar opacities.  Cannot exclude pneumonia. Small right effusion. Electronically Signed   By: Rolm Baptise M.D.   On: 02/22/2017 10:11   Dg Chest Port 1 View  Result Date: 02/20/2017 CLINICAL DATA:  Initial evaluation for intubation, NG tube placement. EXAM: PORTABLE CHEST 1 VIEW COMPARISON:  Prior radiograph from earlier same day. FINDINGS: Endotracheal tube in place with tip positioned approximately 2.9 cm above the carina. NG tube courses in the the abdomen, tip in side hole beyond the GE junction. Defibrillator pads overlie the left chest. Emphysema with patchy bibasilar opacities again noted, right greater than left, not significantly changed. IMPRESSION: 1. Endotracheal tube approximately 2.9 cm above the carina. Enteric tube overlying the stomach. 2. No significant interval change in patchy right greater than left bibasilar opacities. 3. Emphysema. Electronically Signed   By: Jeannine Boga M.D.   On: 02/20/2017 03:23   Dg Chest Portable 1 View  Result Date: 02/20/2017 CLINICAL DATA:  Initial evaluation for acute shortness of breath. EXAM: PORTABLE CHEST 1 VIEW COMPARISON:  Prior radiograph from 01/24/2017. FINDINGS: Cardiac and mediastinal silhouettes are stable in  size and contour, and remain within normal limits. Aortic atherosclerosis. Lungs are hyperinflated with severe emphysematous changes. Increased patchy bibasilar opacities, right greater than left, somewhat concerning for infiltrates. No pulmonary edema. Chronic blunting of the costophrenic angles is similar to prior without obvious pleural effusion. No pneumothorax. Nodular density overlying the superior aspect of the right upper lobe noted, most likely a nipple shadow. No acute osseus abnormality. IMPRESSION: 1. Patchy bibasilar opacities, right worse than left, somewhat concerning for infectious infiltrates. 2. Severe underlying emphysema. Electronically Signed   By: Jeannine Boga M.D.   On: 02/20/2017 00:34   Dg Abd Portable 1v  Result Date: 02/28/2017 CLINICAL DATA:  Generalized abdominal pain. EXAM: PORTABLE ABDOMEN - 1 VIEW COMPARISON:  None. FINDINGS: The bowel gas pattern is normal. Distal tip of nasogastric tube is seen in expected position of gastroesophageal junction. No radio-opaque calculi or other significant radiographic abnormality are seen. IMPRESSION: No evidence of bowel obstruction or ileus. Distal tip of nasogastric tube seen in expected position of gastroesophageal junction. Electronically Signed   By: Marijo Conception, M.D.  On: 02/28/2017 18:43       Subjective:   Discharge Exam: Vitals:   03/08/17 1140 03/08/17 1159  BP: 109/65 109/65  Pulse: (!) 104   Resp: 18   Temp: 97.9 F (36.6 C)    Vitals:   03/08/17 0847 03/08/17 0915 03/08/17 1140 03/08/17 1159  BP: 134/67 134/67 109/65 109/65  Pulse:  (!) 107 (!) 104   Resp:  19 18   Temp: 98.5 F (36.9 C)  97.9 F (36.6 C)   TempSrc: Oral  Oral   SpO2: 100% 100% 100% 100%  Weight:      Height:        General: Pt is alert, awake, not in acute distress Cardiovascular: RRR, S1/S2 +, no rubs, no gallops Respiratory: CTA bilaterally, no wheezing, no rhonchi Abdominal: Soft, NT, ND, bowel sounds  + Extremities: no edema, no cyanosis    The results of significant diagnostics from this hospitalization (including imaging, microbiology, ancillary and laboratory) are listed below for reference.     Microbiology: Recent Results (from the past 240 hour(s))  Culture, respiratory (NON-Expectorated)     Status: None (Preliminary result)   Collection Time: 03/07/17 12:12 PM  Result Value Ref Range Status   Specimen Description TRACHEAL ASPIRATE  Final   Special Requests NONE  Final   Gram Stain   Final    ABUNDANT WBC PRESENT, PREDOMINANTLY PMN RARE GRAM POSITIVE COCCI IN PAIRS RARE GRAM NEGATIVE RODS    Culture TOO YOUNG TO READ  Final   Report Status PENDING  Incomplete     Labs: BNP (last 3 results)  Recent Labs  01/03/17 1656 02/20/17 0109  BNP 56.5 528.4*   Basic Metabolic Panel:  Recent Labs Lab 03/03/17 0420 03/04/17 0459 03/05/17 0356 03/06/17 0432 03/07/17 0425 03/08/17 0605  NA 142 145 141 140 143 142  K 3.8 3.5 3.3* 4.0 3.8 3.6  CL 97* 98* 95* 96* 97* 94*  CO2 38* 38* 37* 38* 40* 40*  GLUCOSE 158* 153* 191* 167* 118* 182*  BUN 42* 34* 30* 29* 31* 34*  CREATININE 0.67 0.55 0.58 0.54 0.48 0.59  CALCIUM 8.5* 8.5* 8.6* 8.7* 8.7* 8.6*  MG 2.1 1.9 1.9 2.0 1.8  --   PHOS 3.7 3.1 3.3 3.4 3.5  --    Liver Function Tests:  Recent Labs Lab 03/08/17 0605  AST 14*  ALT 17  ALKPHOS 82  BILITOT 0.4  PROT 5.7*  ALBUMIN 2.3*   No results for input(s): LIPASE, AMYLASE in the last 168 hours. No results for input(s): AMMONIA in the last 168 hours. CBC:  Recent Labs Lab 03/03/17 0420 03/04/17 0459 03/05/17 0356 03/06/17 0432 03/07/17 0425 03/08/17 0605  WBC 10.0 17.1* 17.7* 17.9* 18.2* 9.5  NEUTROABS 7.4 14.1* 14.0* 14.5* 14.4*  --   HGB 9.1* 9.8* 10.3* 10.0* 10.0* 8.9*  HCT 30.1* 32.6* 32.6* 32.3* 33.4* 29.7*  MCV 101.3* 101.6* 98.8 100.3* 102.5* 101.0*  PLT 293 341 359 384 531* 416*   Cardiac Enzymes: No results for input(s): CKTOTAL, CKMB,  CKMBINDEX, TROPONINI in the last 168 hours. BNP: Invalid input(s): POCBNP CBG:  Recent Labs Lab 03/07/17 2020 03/08/17 0014 03/08/17 0335 03/08/17 0848 03/08/17 1145  GLUCAP 153* 154* 152* 195* 171*   D-Dimer No results for input(s): DDIMER in the last 72 hours. Hgb A1c No results for input(s): HGBA1C in the last 72 hours. Lipid Profile No results for input(s): CHOL, HDL, LDLCALC, TRIG, CHOLHDL, LDLDIRECT in the last 72 hours. Thyroid function studies  No results for input(s): TSH, T4TOTAL, T3FREE, THYROIDAB in the last 72 hours.  Invalid input(s): FREET3 Anemia work up  Recent Labs  03/08/17 0605  VITAMINB12 1,244*  FOLATE 32.1  FERRITIN 186  TIBC 213*  IRON 24*  RETICCTPCT 1.2   Urinalysis    Component Value Date/Time   COLORURINE YELLOW 02/22/2017 0752   APPEARANCEUR CLOUDY (A) 02/22/2017 0752   LABSPEC 1.014 02/22/2017 0752   PHURINE 5.0 02/22/2017 0752   GLUCOSEU 50 (A) 02/22/2017 0752   HGBUR MODERATE (A) 02/22/2017 0752   BILIRUBINUR NEGATIVE 02/22/2017 0752   KETONESUR NEGATIVE 02/22/2017 0752   PROTEINUR 30 (A) 02/22/2017 0752   UROBILINOGEN 0.2 08/25/2015 2130   NITRITE NEGATIVE 02/22/2017 0752   LEUKOCYTESUR NEGATIVE 02/22/2017 0752   Sepsis Labs Invalid input(s): PROCALCITONIN,  WBC,  LACTICIDVEN Microbiology Recent Results (from the past 240 hour(s))  Culture, respiratory (NON-Expectorated)     Status: None (Preliminary result)   Collection Time: 03/07/17 12:12 PM  Result Value Ref Range Status   Specimen Description TRACHEAL ASPIRATE  Final   Special Requests NONE  Final   Gram Stain   Final    ABUNDANT WBC PRESENT, PREDOMINANTLY PMN RARE GRAM POSITIVE COCCI IN PAIRS RARE GRAM NEGATIVE RODS    Culture TOO YOUNG TO READ  Final   Report Status PENDING  Incomplete     Time coordinating discharge: Over 30 minutes  SIGNED:   Damita Lack, MD  [facility] 03/08/2017, 1:10 PM Pager   If 7PM-7AM, please contact  night-coverage [email or web address] Password [email or web address]

## 2017-03-08 NOTE — Progress Notes (Signed)
Patient was discharged to telemetry accompanied by RN and RRT. Patient was transported with the following belongings: pink slippers, gold purse, cell phone, cell charger, rose in glass, ballons x2, books x2, and scarves x3. HPOA, [name], was notified. Patient tolerated transfer well.

## 2017-03-09 LAB — PROTIME-INR
INR: 1.05
PROTHROMBIN TIME: 13.7 s (ref 11.4–15.2)

## 2017-03-09 LAB — CBC WITH DIFFERENTIAL/PLATELET
BASOS ABS: 0 10*3/uL (ref 0.0–0.1)
Basophils Relative: 1 %
EOS ABS: 0.1 10*3/uL (ref 0.0–0.7)
EOS PCT: 1 %
HCT: 26.9 % — ABNORMAL LOW (ref 36.0–46.0)
Hemoglobin: 8.1 g/dL — ABNORMAL LOW (ref 12.0–15.0)
LYMPHS PCT: 18 %
Lymphs Abs: 1 10*3/uL (ref 0.7–4.0)
MCH: 30.6 pg (ref 26.0–34.0)
MCHC: 30.1 g/dL (ref 30.0–36.0)
MCV: 101.5 fL — AB (ref 78.0–100.0)
MONO ABS: 0.7 10*3/uL (ref 0.1–1.0)
Monocytes Relative: 13 %
Neutro Abs: 3.9 10*3/uL (ref 1.7–7.7)
Neutrophils Relative %: 67 %
PLATELETS: 363 10*3/uL (ref 150–400)
RBC: 2.65 MIL/uL — ABNORMAL LOW (ref 3.87–5.11)
RDW: 12.9 % (ref 11.5–15.5)
WBC: 5.7 10*3/uL (ref 4.0–10.5)

## 2017-03-09 LAB — COMPREHENSIVE METABOLIC PANEL
ALT: 16 U/L (ref 14–54)
AST: 16 U/L (ref 15–41)
Albumin: 2.2 g/dL — ABNORMAL LOW (ref 3.5–5.0)
Alkaline Phosphatase: 71 U/L (ref 38–126)
Anion gap: 4 — ABNORMAL LOW (ref 5–15)
BUN: 32 mg/dL — AB (ref 6–20)
CHLORIDE: 96 mmol/L — AB (ref 101–111)
CO2: 42 mmol/L — AB (ref 22–32)
Calcium: 8.5 mg/dL — ABNORMAL LOW (ref 8.9–10.3)
Creatinine, Ser: 0.64 mg/dL (ref 0.44–1.00)
GLUCOSE: 203 mg/dL — AB (ref 65–99)
POTASSIUM: 3.5 mmol/L (ref 3.5–5.1)
SODIUM: 142 mmol/L (ref 135–145)
TOTAL PROTEIN: 5.3 g/dL — AB (ref 6.5–8.1)
Total Bilirubin: 0.1 mg/dL — ABNORMAL LOW (ref 0.3–1.2)

## 2017-03-10 LAB — CULTURE, RESPIRATORY W GRAM STAIN

## 2017-03-10 LAB — CULTURE, RESPIRATORY

## 2017-03-15 ENCOUNTER — Other Ambulatory Visit (HOSPITAL_COMMUNITY): Payer: Medicare Other

## 2017-03-15 DIAGNOSIS — J9690 Respiratory failure, unspecified, unspecified whether with hypoxia or hypercapnia: Secondary | ICD-10-CM | POA: Diagnosis not present

## 2017-03-15 LAB — BASIC METABOLIC PANEL
ANION GAP: 8 (ref 5–15)
BUN: 28 mg/dL — ABNORMAL HIGH (ref 6–20)
CALCIUM: 8.8 mg/dL — AB (ref 8.9–10.3)
CO2: 33 mmol/L — AB (ref 22–32)
Chloride: 94 mmol/L — ABNORMAL LOW (ref 101–111)
Creatinine, Ser: 0.58 mg/dL (ref 0.44–1.00)
Glucose, Bld: 140 mg/dL — ABNORMAL HIGH (ref 65–99)
Potassium: 4.9 mmol/L (ref 3.5–5.1)
SODIUM: 135 mmol/L (ref 135–145)

## 2017-03-15 LAB — MAGNESIUM: MAGNESIUM: 2 mg/dL (ref 1.7–2.4)

## 2017-03-15 LAB — CBC
HCT: 30.2 % — ABNORMAL LOW (ref 36.0–46.0)
Hemoglobin: 9.3 g/dL — ABNORMAL LOW (ref 12.0–15.0)
MCH: 30.5 pg (ref 26.0–34.0)
MCHC: 30.8 g/dL (ref 30.0–36.0)
MCV: 99 fL (ref 78.0–100.0)
Platelets: 506 10*3/uL — ABNORMAL HIGH (ref 150–400)
RBC: 3.05 MIL/uL — ABNORMAL LOW (ref 3.87–5.11)
RDW: 13.1 % (ref 11.5–15.5)
WBC: 9.1 10*3/uL (ref 4.0–10.5)

## 2017-03-15 LAB — PHOSPHORUS: PHOSPHORUS: 4.3 mg/dL (ref 2.5–4.6)

## 2017-03-18 ENCOUNTER — Other Ambulatory Visit (HOSPITAL_COMMUNITY): Payer: Medicare Other

## 2017-03-18 DIAGNOSIS — J9690 Respiratory failure, unspecified, unspecified whether with hypoxia or hypercapnia: Secondary | ICD-10-CM | POA: Diagnosis not present

## 2017-03-18 LAB — BLOOD GAS, ARTERIAL
Acid-Base Excess: 8.2 mmol/L — ABNORMAL HIGH (ref 0.0–2.0)
Bicarbonate: 35.2 mmol/L — ABNORMAL HIGH (ref 20.0–28.0)
FIO2: 40
LHR: 25 {breaths}/min
MECHVT: 360 mL
O2 Saturation: 97 %
PEEP/CPAP: 5 cmH2O
PH ART: 7.256 — AB (ref 7.350–7.450)
PO2 ART: 102 mmHg (ref 83.0–108.0)
Patient temperature: 98.6
pCO2 arterial: 82 mmHg (ref 32.0–48.0)

## 2017-03-19 LAB — BLOOD GAS, ARTERIAL
ACID-BASE EXCESS: 13 mmol/L — AB (ref 0.0–2.0)
Bicarbonate: 38.1 mmol/L — ABNORMAL HIGH (ref 20.0–28.0)
FIO2: 28
MECHVT: 340 mL
O2 SAT: 95.2 %
PATIENT TEMPERATURE: 98.6
PCO2 ART: 59.6 mmHg — AB (ref 32.0–48.0)
PEEP/CPAP: 5 cmH2O
PH ART: 7.422 (ref 7.350–7.450)
RATE: 28 resp/min
pO2, Arterial: 76.4 mmHg — ABNORMAL LOW (ref 83.0–108.0)

## 2017-03-19 LAB — BASIC METABOLIC PANEL
ANION GAP: 8 (ref 5–15)
BUN: 39 mg/dL — AB (ref 6–20)
CHLORIDE: 93 mmol/L — AB (ref 101–111)
CO2: 35 mmol/L — AB (ref 22–32)
Calcium: 8.9 mg/dL (ref 8.9–10.3)
Creatinine, Ser: 0.73 mg/dL (ref 0.44–1.00)
GFR calc Af Amer: >60 mL/min (ref >60)
GFR calc non Af Amer: >60 mL/min (ref >60)
GLUCOSE: 116 mg/dL — AB (ref 65–99)
POTASSIUM: 4.7 mmol/L (ref 3.5–5.1)
Sodium: 136 mmol/L (ref 135–145)

## 2017-03-19 LAB — CBC
HEMATOCRIT: 29.9 % — AB (ref 36.0–46.0)
HEMOGLOBIN: 9.3 g/dL — AB (ref 12.0–15.0)
MCH: 30.2 pg (ref 26.0–34.0)
MCHC: 31.1 g/dL (ref 30.0–36.0)
MCV: 97.1 fL (ref 78.0–100.0)
Platelets: 567 10*3/uL — ABNORMAL HIGH (ref 150–400)
RBC: 3.08 MIL/uL — AB (ref 3.87–5.11)
RDW: 13 % (ref 11.5–15.5)
WBC: 7.8 10*3/uL (ref 4.0–10.5)

## 2017-03-20 ENCOUNTER — Other Ambulatory Visit (HOSPITAL_COMMUNITY): Payer: Medicare Other

## 2017-03-20 DIAGNOSIS — Z4682 Encounter for fitting and adjustment of non-vascular catheter: Secondary | ICD-10-CM | POA: Diagnosis not present

## 2017-03-24 LAB — BASIC METABOLIC PANEL
ANION GAP: 6 (ref 5–15)
BUN: 23 mg/dL — ABNORMAL HIGH (ref 6–20)
CHLORIDE: 89 mmol/L — AB (ref 101–111)
CO2: 42 mmol/L — ABNORMAL HIGH (ref 22–32)
Calcium: 9.3 mg/dL (ref 8.9–10.3)
Creatinine, Ser: 0.66 mg/dL (ref 0.44–1.00)
Glucose, Bld: 128 mg/dL — ABNORMAL HIGH (ref 65–99)
POTASSIUM: 4.1 mmol/L (ref 3.5–5.1)
SODIUM: 137 mmol/L (ref 135–145)

## 2017-03-24 LAB — TROPONIN I: Troponin I: <0.03 ng/mL (ref <0.03)

## 2017-03-25 ENCOUNTER — Other Ambulatory Visit (HOSPITAL_COMMUNITY): Payer: Medicare Other

## 2017-04-03 LAB — BASIC METABOLIC PANEL
Anion gap: 4 — ABNORMAL LOW (ref 5–15)
BUN: 20 mg/dL (ref 6–20)
CHLORIDE: 97 mmol/L — AB (ref 101–111)
CO2: 35 mmol/L — ABNORMAL HIGH (ref 22–32)
Calcium: 8.8 mg/dL — ABNORMAL LOW (ref 8.9–10.3)
Creatinine, Ser: 0.56 mg/dL (ref 0.44–1.00)
GFR calc non Af Amer: >60 mL/min (ref >60)
Glucose, Bld: 80 mg/dL (ref 65–99)
POTASSIUM: 4.1 mmol/L (ref 3.5–5.1)
Sodium: 136 mmol/L (ref 135–145)

## 2017-04-03 LAB — CBC
HEMATOCRIT: 35.2 % — AB (ref 36.0–46.0)
Hemoglobin: 11 g/dL — ABNORMAL LOW (ref 12.0–15.0)
MCH: 30.1 pg (ref 26.0–34.0)
MCHC: 31.3 g/dL (ref 30.0–36.0)
MCV: 96.4 fL (ref 78.0–100.0)
Platelets: 379 10*3/uL (ref 150–400)
RBC: 3.65 MIL/uL — AB (ref 3.87–5.11)
RDW: 13.9 % (ref 11.5–15.5)
WBC: 6.4 10*3/uL (ref 4.0–10.5)

## 2017-04-04 DIAGNOSIS — F411 Generalized anxiety disorder: Secondary | ICD-10-CM | POA: Diagnosis not present

## 2017-04-04 DIAGNOSIS — R531 Weakness: Secondary | ICD-10-CM | POA: Diagnosis not present

## 2017-04-04 DIAGNOSIS — J189 Pneumonia, unspecified organism: Secondary | ICD-10-CM | POA: Diagnosis not present

## 2017-04-04 DIAGNOSIS — J181 Lobar pneumonia, unspecified organism: Secondary | ICD-10-CM | POA: Diagnosis not present

## 2017-04-04 DIAGNOSIS — J449 Chronic obstructive pulmonary disease, unspecified: Secondary | ICD-10-CM | POA: Diagnosis not present

## 2017-04-04 DIAGNOSIS — R05 Cough: Secondary | ICD-10-CM | POA: Diagnosis not present

## 2017-04-04 DIAGNOSIS — Z7951 Long term (current) use of inhaled steroids: Secondary | ICD-10-CM | POA: Diagnosis not present

## 2017-04-04 DIAGNOSIS — K219 Gastro-esophageal reflux disease without esophagitis: Secondary | ICD-10-CM | POA: Diagnosis not present

## 2017-04-04 DIAGNOSIS — Z87891 Personal history of nicotine dependence: Secondary | ICD-10-CM | POA: Diagnosis not present

## 2017-04-04 DIAGNOSIS — R109 Unspecified abdominal pain: Secondary | ICD-10-CM | POA: Diagnosis not present

## 2017-04-04 DIAGNOSIS — J441 Chronic obstructive pulmonary disease with (acute) exacerbation: Secondary | ICD-10-CM | POA: Diagnosis not present

## 2017-04-04 DIAGNOSIS — I491 Atrial premature depolarization: Secondary | ICD-10-CM | POA: Diagnosis not present

## 2017-04-04 DIAGNOSIS — R0989 Other specified symptoms and signs involving the circulatory and respiratory systems: Secondary | ICD-10-CM | POA: Diagnosis not present

## 2017-04-04 DIAGNOSIS — R000 Tachycardia, unspecified: Secondary | ICD-10-CM | POA: Diagnosis not present

## 2017-04-04 DIAGNOSIS — R5381 Other malaise: Secondary | ICD-10-CM | POA: Diagnosis not present

## 2017-04-04 DIAGNOSIS — J9622 Acute and chronic respiratory failure with hypercapnia: Secondary | ICD-10-CM | POA: Diagnosis not present

## 2017-04-04 DIAGNOSIS — Z880 Allergy status to penicillin: Secondary | ICD-10-CM | POA: Diagnosis not present

## 2017-04-04 DIAGNOSIS — F339 Major depressive disorder, recurrent, unspecified: Secondary | ICD-10-CM | POA: Diagnosis not present

## 2017-04-04 DIAGNOSIS — I10 Essential (primary) hypertension: Secondary | ICD-10-CM | POA: Diagnosis not present

## 2017-04-04 DIAGNOSIS — F4310 Post-traumatic stress disorder, unspecified: Secondary | ICD-10-CM | POA: Diagnosis present

## 2017-04-04 DIAGNOSIS — J80 Acute respiratory distress syndrome: Secondary | ICD-10-CM | POA: Diagnosis not present

## 2017-04-04 DIAGNOSIS — Z79899 Other long term (current) drug therapy: Secondary | ICD-10-CM | POA: Diagnosis not present

## 2017-04-04 DIAGNOSIS — F419 Anxiety disorder, unspecified: Secondary | ICD-10-CM | POA: Diagnosis present

## 2017-04-04 DIAGNOSIS — E860 Dehydration: Secondary | ICD-10-CM | POA: Diagnosis present

## 2017-04-04 DIAGNOSIS — R0602 Shortness of breath: Secondary | ICD-10-CM | POA: Diagnosis not present

## 2017-04-04 DIAGNOSIS — N170 Acute kidney failure with tubular necrosis: Secondary | ICD-10-CM | POA: Diagnosis not present

## 2017-04-04 DIAGNOSIS — J9690 Respiratory failure, unspecified, unspecified whether with hypoxia or hypercapnia: Secondary | ICD-10-CM | POA: Diagnosis not present

## 2017-04-04 DIAGNOSIS — M6281 Muscle weakness (generalized): Secondary | ICD-10-CM | POA: Diagnosis not present

## 2017-04-04 DIAGNOSIS — E119 Type 2 diabetes mellitus without complications: Secondary | ICD-10-CM | POA: Diagnosis not present

## 2017-04-04 DIAGNOSIS — Z452 Encounter for adjustment and management of vascular access device: Secondary | ICD-10-CM | POA: Diagnosis not present

## 2017-04-04 DIAGNOSIS — I509 Heart failure, unspecified: Secondary | ICD-10-CM | POA: Diagnosis not present

## 2017-04-04 DIAGNOSIS — F418 Other specified anxiety disorders: Secondary | ICD-10-CM | POA: Diagnosis not present

## 2017-04-04 DIAGNOSIS — R918 Other nonspecific abnormal finding of lung field: Secondary | ICD-10-CM | POA: Diagnosis not present

## 2017-04-04 DIAGNOSIS — R4182 Altered mental status, unspecified: Secondary | ICD-10-CM | POA: Diagnosis not present

## 2017-04-04 DIAGNOSIS — Z7982 Long term (current) use of aspirin: Secondary | ICD-10-CM | POA: Diagnosis not present

## 2017-04-04 DIAGNOSIS — J9600 Acute respiratory failure, unspecified whether with hypoxia or hypercapnia: Secondary | ICD-10-CM | POA: Diagnosis not present

## 2017-04-04 DIAGNOSIS — I5032 Chronic diastolic (congestive) heart failure: Secondary | ICD-10-CM | POA: Diagnosis present

## 2017-04-04 DIAGNOSIS — F329 Major depressive disorder, single episode, unspecified: Secondary | ICD-10-CM | POA: Diagnosis present

## 2017-04-07 DIAGNOSIS — R0989 Other specified symptoms and signs involving the circulatory and respiratory systems: Secondary | ICD-10-CM | POA: Diagnosis not present

## 2017-04-07 DIAGNOSIS — R4182 Altered mental status, unspecified: Secondary | ICD-10-CM | POA: Diagnosis not present

## 2017-04-07 DIAGNOSIS — J441 Chronic obstructive pulmonary disease with (acute) exacerbation: Secondary | ICD-10-CM | POA: Diagnosis not present

## 2017-04-07 DIAGNOSIS — I509 Heart failure, unspecified: Secondary | ICD-10-CM | POA: Diagnosis not present

## 2017-04-07 DIAGNOSIS — R05 Cough: Secondary | ICD-10-CM | POA: Diagnosis not present

## 2017-04-07 DIAGNOSIS — R5381 Other malaise: Secondary | ICD-10-CM | POA: Diagnosis not present

## 2017-04-08 DIAGNOSIS — I5032 Chronic diastolic (congestive) heart failure: Secondary | ICD-10-CM | POA: Diagnosis not present

## 2017-04-08 DIAGNOSIS — Z87891 Personal history of nicotine dependence: Secondary | ICD-10-CM | POA: Diagnosis not present

## 2017-04-08 DIAGNOSIS — J441 Chronic obstructive pulmonary disease with (acute) exacerbation: Secondary | ICD-10-CM | POA: Diagnosis not present

## 2017-04-08 DIAGNOSIS — R0602 Shortness of breath: Secondary | ICD-10-CM | POA: Diagnosis not present

## 2017-04-08 DIAGNOSIS — J9622 Acute and chronic respiratory failure with hypercapnia: Secondary | ICD-10-CM | POA: Diagnosis not present

## 2017-04-08 DIAGNOSIS — F329 Major depressive disorder, single episode, unspecified: Secondary | ICD-10-CM | POA: Diagnosis not present

## 2017-04-08 DIAGNOSIS — F4310 Post-traumatic stress disorder, unspecified: Secondary | ICD-10-CM | POA: Diagnosis not present

## 2017-04-08 DIAGNOSIS — E860 Dehydration: Secondary | ICD-10-CM | POA: Diagnosis not present

## 2017-04-09 DIAGNOSIS — M6281 Muscle weakness (generalized): Secondary | ICD-10-CM | POA: Diagnosis not present

## 2017-04-09 DIAGNOSIS — F339 Major depressive disorder, recurrent, unspecified: Secondary | ICD-10-CM | POA: Diagnosis not present

## 2017-04-09 DIAGNOSIS — F411 Generalized anxiety disorder: Secondary | ICD-10-CM | POA: Diagnosis not present

## 2017-04-09 DIAGNOSIS — Z79899 Other long term (current) drug therapy: Secondary | ICD-10-CM | POA: Diagnosis not present

## 2017-04-09 DIAGNOSIS — F4310 Post-traumatic stress disorder, unspecified: Secondary | ICD-10-CM | POA: Diagnosis present

## 2017-04-09 DIAGNOSIS — Z880 Allergy status to penicillin: Secondary | ICD-10-CM | POA: Diagnosis not present

## 2017-04-09 DIAGNOSIS — I509 Heart failure, unspecified: Secondary | ICD-10-CM | POA: Diagnosis not present

## 2017-04-09 DIAGNOSIS — K219 Gastro-esophageal reflux disease without esophagitis: Secondary | ICD-10-CM | POA: Diagnosis not present

## 2017-04-09 DIAGNOSIS — E860 Dehydration: Secondary | ICD-10-CM | POA: Diagnosis present

## 2017-04-09 DIAGNOSIS — I10 Essential (primary) hypertension: Secondary | ICD-10-CM | POA: Diagnosis not present

## 2017-04-09 DIAGNOSIS — R0689 Other abnormalities of breathing: Secondary | ICD-10-CM | POA: Diagnosis not present

## 2017-04-09 DIAGNOSIS — E119 Type 2 diabetes mellitus without complications: Secondary | ICD-10-CM | POA: Diagnosis not present

## 2017-04-09 DIAGNOSIS — J9690 Respiratory failure, unspecified, unspecified whether with hypoxia or hypercapnia: Secondary | ICD-10-CM | POA: Diagnosis not present

## 2017-04-09 DIAGNOSIS — Z9981 Dependence on supplemental oxygen: Secondary | ICD-10-CM | POA: Diagnosis not present

## 2017-04-09 DIAGNOSIS — J181 Lobar pneumonia, unspecified organism: Secondary | ICD-10-CM | POA: Diagnosis not present

## 2017-04-09 DIAGNOSIS — F419 Anxiety disorder, unspecified: Secondary | ICD-10-CM | POA: Diagnosis not present

## 2017-04-09 DIAGNOSIS — F329 Major depressive disorder, single episode, unspecified: Secondary | ICD-10-CM | POA: Diagnosis present

## 2017-04-09 DIAGNOSIS — J441 Chronic obstructive pulmonary disease with (acute) exacerbation: Secondary | ICD-10-CM | POA: Diagnosis not present

## 2017-04-09 DIAGNOSIS — R2990 Unspecified symptoms and signs involving the nervous system: Secondary | ICD-10-CM | POA: Diagnosis not present

## 2017-04-09 DIAGNOSIS — Z7982 Long term (current) use of aspirin: Secondary | ICD-10-CM | POA: Diagnosis not present

## 2017-04-09 DIAGNOSIS — Z7951 Long term (current) use of inhaled steroids: Secondary | ICD-10-CM | POA: Diagnosis not present

## 2017-04-09 DIAGNOSIS — R0602 Shortness of breath: Secondary | ICD-10-CM | POA: Diagnosis not present

## 2017-04-09 DIAGNOSIS — J9622 Acute and chronic respiratory failure with hypercapnia: Secondary | ICD-10-CM | POA: Diagnosis present

## 2017-04-09 DIAGNOSIS — R000 Tachycardia, unspecified: Secondary | ICD-10-CM | POA: Diagnosis not present

## 2017-04-09 DIAGNOSIS — N170 Acute kidney failure with tubular necrosis: Secondary | ICD-10-CM | POA: Diagnosis not present

## 2017-04-09 DIAGNOSIS — I5032 Chronic diastolic (congestive) heart failure: Secondary | ICD-10-CM | POA: Diagnosis present

## 2017-04-09 DIAGNOSIS — Z87891 Personal history of nicotine dependence: Secondary | ICD-10-CM | POA: Diagnosis not present

## 2017-04-09 DIAGNOSIS — I491 Atrial premature depolarization: Secondary | ICD-10-CM | POA: Diagnosis not present

## 2017-04-12 DIAGNOSIS — J441 Chronic obstructive pulmonary disease with (acute) exacerbation: Secondary | ICD-10-CM | POA: Diagnosis not present

## 2017-04-12 DIAGNOSIS — R0689 Other abnormalities of breathing: Secondary | ICD-10-CM | POA: Diagnosis not present

## 2017-04-12 DIAGNOSIS — R262 Difficulty in walking, not elsewhere classified: Secondary | ICD-10-CM | POA: Diagnosis not present

## 2017-04-12 DIAGNOSIS — M6281 Muscle weakness (generalized): Secondary | ICD-10-CM | POA: Diagnosis not present

## 2017-04-12 DIAGNOSIS — I10 Essential (primary) hypertension: Secondary | ICD-10-CM | POA: Diagnosis not present

## 2017-04-12 DIAGNOSIS — M545 Low back pain: Secondary | ICD-10-CM | POA: Diagnosis not present

## 2017-04-12 DIAGNOSIS — Z9981 Dependence on supplemental oxygen: Secondary | ICD-10-CM | POA: Diagnosis not present

## 2017-04-12 DIAGNOSIS — N170 Acute kidney failure with tubular necrosis: Secondary | ICD-10-CM | POA: Diagnosis not present

## 2017-04-12 DIAGNOSIS — K219 Gastro-esophageal reflux disease without esophagitis: Secondary | ICD-10-CM | POA: Diagnosis not present

## 2017-04-12 DIAGNOSIS — J9690 Respiratory failure, unspecified, unspecified whether with hypoxia or hypercapnia: Secondary | ICD-10-CM | POA: Diagnosis not present

## 2017-04-12 DIAGNOSIS — F410 Panic disorder [episodic paroxysmal anxiety] without agoraphobia: Secondary | ICD-10-CM | POA: Diagnosis not present

## 2017-04-12 DIAGNOSIS — F419 Anxiety disorder, unspecified: Secondary | ICD-10-CM | POA: Diagnosis not present

## 2017-04-12 DIAGNOSIS — J181 Lobar pneumonia, unspecified organism: Secondary | ICD-10-CM | POA: Diagnosis not present

## 2017-04-12 DIAGNOSIS — F411 Generalized anxiety disorder: Secondary | ICD-10-CM | POA: Diagnosis not present

## 2017-04-12 DIAGNOSIS — F339 Major depressive disorder, recurrent, unspecified: Secondary | ICD-10-CM | POA: Diagnosis not present

## 2017-04-12 DIAGNOSIS — R5381 Other malaise: Secondary | ICD-10-CM | POA: Diagnosis not present

## 2017-04-12 DIAGNOSIS — E119 Type 2 diabetes mellitus without complications: Secondary | ICD-10-CM | POA: Diagnosis not present

## 2017-04-12 DIAGNOSIS — R2990 Unspecified symptoms and signs involving the nervous system: Secondary | ICD-10-CM | POA: Diagnosis not present

## 2017-04-12 DIAGNOSIS — R000 Tachycardia, unspecified: Secondary | ICD-10-CM | POA: Diagnosis not present

## 2017-04-12 DIAGNOSIS — I509 Heart failure, unspecified: Secondary | ICD-10-CM | POA: Diagnosis not present

## 2017-04-14 DIAGNOSIS — F419 Anxiety disorder, unspecified: Secondary | ICD-10-CM | POA: Diagnosis not present

## 2017-04-18 DIAGNOSIS — F411 Generalized anxiety disorder: Secondary | ICD-10-CM | POA: Diagnosis not present

## 2017-04-18 DIAGNOSIS — F410 Panic disorder [episodic paroxysmal anxiety] without agoraphobia: Secondary | ICD-10-CM | POA: Diagnosis not present

## 2017-04-18 DIAGNOSIS — J441 Chronic obstructive pulmonary disease with (acute) exacerbation: Secondary | ICD-10-CM | POA: Diagnosis not present

## 2017-04-22 DIAGNOSIS — R5381 Other malaise: Secondary | ICD-10-CM | POA: Diagnosis not present

## 2017-04-22 DIAGNOSIS — M6281 Muscle weakness (generalized): Secondary | ICD-10-CM | POA: Diagnosis not present

## 2017-04-22 DIAGNOSIS — M545 Low back pain: Secondary | ICD-10-CM | POA: Diagnosis not present

## 2017-04-22 DIAGNOSIS — R262 Difficulty in walking, not elsewhere classified: Secondary | ICD-10-CM | POA: Diagnosis not present

## 2017-04-28 ENCOUNTER — Ambulatory Visit: Payer: Medicare Other | Admitting: Emergency Medicine

## 2017-05-12 DIAGNOSIS — F339 Major depressive disorder, recurrent, unspecified: Secondary | ICD-10-CM | POA: Diagnosis not present

## 2017-05-12 DIAGNOSIS — J441 Chronic obstructive pulmonary disease with (acute) exacerbation: Secondary | ICD-10-CM | POA: Diagnosis not present

## 2017-05-12 DIAGNOSIS — K219 Gastro-esophageal reflux disease without esophagitis: Secondary | ICD-10-CM | POA: Diagnosis not present

## 2017-05-12 DIAGNOSIS — J9612 Chronic respiratory failure with hypercapnia: Secondary | ICD-10-CM | POA: Diagnosis not present

## 2017-05-12 DIAGNOSIS — I10 Essential (primary) hypertension: Secondary | ICD-10-CM | POA: Diagnosis not present

## 2017-05-12 DIAGNOSIS — F419 Anxiety disorder, unspecified: Secondary | ICD-10-CM | POA: Diagnosis not present

## 2017-05-16 DIAGNOSIS — J9612 Chronic respiratory failure with hypercapnia: Secondary | ICD-10-CM | POA: Diagnosis not present

## 2017-05-16 DIAGNOSIS — F419 Anxiety disorder, unspecified: Secondary | ICD-10-CM | POA: Diagnosis not present

## 2017-05-16 DIAGNOSIS — F339 Major depressive disorder, recurrent, unspecified: Secondary | ICD-10-CM | POA: Diagnosis not present

## 2017-05-16 DIAGNOSIS — J441 Chronic obstructive pulmonary disease with (acute) exacerbation: Secondary | ICD-10-CM | POA: Diagnosis not present

## 2017-05-16 DIAGNOSIS — K219 Gastro-esophageal reflux disease without esophagitis: Secondary | ICD-10-CM | POA: Diagnosis not present

## 2017-05-16 DIAGNOSIS — I10 Essential (primary) hypertension: Secondary | ICD-10-CM | POA: Diagnosis not present

## 2017-05-17 DIAGNOSIS — F339 Major depressive disorder, recurrent, unspecified: Secondary | ICD-10-CM | POA: Diagnosis not present

## 2017-05-17 DIAGNOSIS — I10 Essential (primary) hypertension: Secondary | ICD-10-CM | POA: Diagnosis not present

## 2017-05-17 DIAGNOSIS — J441 Chronic obstructive pulmonary disease with (acute) exacerbation: Secondary | ICD-10-CM | POA: Diagnosis not present

## 2017-05-17 DIAGNOSIS — F419 Anxiety disorder, unspecified: Secondary | ICD-10-CM | POA: Diagnosis not present

## 2017-05-17 DIAGNOSIS — K219 Gastro-esophageal reflux disease without esophagitis: Secondary | ICD-10-CM | POA: Diagnosis not present

## 2017-05-17 DIAGNOSIS — J9612 Chronic respiratory failure with hypercapnia: Secondary | ICD-10-CM | POA: Diagnosis not present

## 2017-05-19 DIAGNOSIS — K219 Gastro-esophageal reflux disease without esophagitis: Secondary | ICD-10-CM | POA: Diagnosis not present

## 2017-05-19 DIAGNOSIS — F419 Anxiety disorder, unspecified: Secondary | ICD-10-CM | POA: Diagnosis not present

## 2017-05-19 DIAGNOSIS — J441 Chronic obstructive pulmonary disease with (acute) exacerbation: Secondary | ICD-10-CM | POA: Diagnosis not present

## 2017-05-19 DIAGNOSIS — I10 Essential (primary) hypertension: Secondary | ICD-10-CM | POA: Diagnosis not present

## 2017-05-19 DIAGNOSIS — F339 Major depressive disorder, recurrent, unspecified: Secondary | ICD-10-CM | POA: Diagnosis not present

## 2017-05-19 DIAGNOSIS — J9612 Chronic respiratory failure with hypercapnia: Secondary | ICD-10-CM | POA: Diagnosis not present

## 2017-06-01 DIAGNOSIS — F339 Major depressive disorder, recurrent, unspecified: Secondary | ICD-10-CM | POA: Diagnosis not present

## 2017-06-01 DIAGNOSIS — K219 Gastro-esophageal reflux disease without esophagitis: Secondary | ICD-10-CM | POA: Diagnosis not present

## 2017-06-01 DIAGNOSIS — I10 Essential (primary) hypertension: Secondary | ICD-10-CM | POA: Diagnosis not present

## 2017-06-01 DIAGNOSIS — J9612 Chronic respiratory failure with hypercapnia: Secondary | ICD-10-CM | POA: Diagnosis not present

## 2017-06-01 DIAGNOSIS — F419 Anxiety disorder, unspecified: Secondary | ICD-10-CM | POA: Diagnosis not present

## 2017-06-01 DIAGNOSIS — J441 Chronic obstructive pulmonary disease with (acute) exacerbation: Secondary | ICD-10-CM | POA: Diagnosis not present

## 2017-06-03 ENCOUNTER — Ambulatory Visit: Payer: Medicare Other | Admitting: Family

## 2017-06-07 DIAGNOSIS — J441 Chronic obstructive pulmonary disease with (acute) exacerbation: Secondary | ICD-10-CM | POA: Diagnosis not present

## 2017-06-07 DIAGNOSIS — J9612 Chronic respiratory failure with hypercapnia: Secondary | ICD-10-CM | POA: Diagnosis not present

## 2017-06-07 DIAGNOSIS — I10 Essential (primary) hypertension: Secondary | ICD-10-CM | POA: Diagnosis not present

## 2017-06-07 DIAGNOSIS — F419 Anxiety disorder, unspecified: Secondary | ICD-10-CM | POA: Diagnosis not present

## 2017-06-07 DIAGNOSIS — F339 Major depressive disorder, recurrent, unspecified: Secondary | ICD-10-CM | POA: Diagnosis not present

## 2017-06-07 DIAGNOSIS — K219 Gastro-esophageal reflux disease without esophagitis: Secondary | ICD-10-CM | POA: Diagnosis not present

## 2017-06-09 ENCOUNTER — Ambulatory Visit: Payer: Medicare Other | Admitting: Family

## 2017-06-16 DIAGNOSIS — I10 Essential (primary) hypertension: Secondary | ICD-10-CM | POA: Diagnosis not present

## 2017-06-16 DIAGNOSIS — F419 Anxiety disorder, unspecified: Secondary | ICD-10-CM | POA: Diagnosis not present

## 2017-06-16 DIAGNOSIS — F339 Major depressive disorder, recurrent, unspecified: Secondary | ICD-10-CM | POA: Diagnosis not present

## 2017-06-16 DIAGNOSIS — J441 Chronic obstructive pulmonary disease with (acute) exacerbation: Secondary | ICD-10-CM | POA: Diagnosis not present

## 2017-06-16 DIAGNOSIS — K219 Gastro-esophageal reflux disease without esophagitis: Secondary | ICD-10-CM | POA: Diagnosis not present

## 2017-06-16 DIAGNOSIS — J9612 Chronic respiratory failure with hypercapnia: Secondary | ICD-10-CM | POA: Diagnosis not present

## 2017-06-17 ENCOUNTER — Ambulatory Visit (INDEPENDENT_AMBULATORY_CARE_PROVIDER_SITE_OTHER): Payer: Medicare Other | Admitting: Family

## 2017-06-17 ENCOUNTER — Encounter: Payer: Self-pay | Admitting: Family

## 2017-06-17 VITALS — BP 112/60 | HR 63 | Temp 98.5°F | Resp 16 | Ht 63.0 in | Wt 83.0 lb

## 2017-06-17 DIAGNOSIS — F419 Anxiety disorder, unspecified: Secondary | ICD-10-CM | POA: Diagnosis not present

## 2017-06-17 DIAGNOSIS — E43 Unspecified severe protein-calorie malnutrition: Secondary | ICD-10-CM

## 2017-06-17 DIAGNOSIS — R000 Tachycardia, unspecified: Secondary | ICD-10-CM

## 2017-06-17 DIAGNOSIS — J439 Emphysema, unspecified: Secondary | ICD-10-CM

## 2017-06-17 MED ORDER — SERTRALINE HCL 25 MG PO TABS: 25.0000 mg | ORAL_TABLET | Freq: Every day | ORAL | 0 refills | Status: DC

## 2017-06-17 MED ORDER — LEVALBUTEROL TARTRATE 45 MCG/ACT IN AERO: 1.0000 | INHALATION_SPRAY | RESPIRATORY_TRACT | 3 refills | Status: DC | PRN

## 2017-06-17 MED ORDER — METOPROLOL TARTRATE 25 MG PO TABS: 12.5000 mg | ORAL_TABLET | Freq: Two times a day (BID) | ORAL | 0 refills | Status: DC

## 2017-06-17 NOTE — Assessment & Plan Note (Signed)
Continues to experience tachycardia of undetermined origin and maintained on metoprolol for rate control. No adverse side effects. Continue to monitor.

## 2017-06-17 NOTE — Assessment & Plan Note (Signed)
Stable with current medication regimen and no adverse side effects or evidence of exacerbation. She can continues to remain on oxygen via nasal cannula. Pulmonary exam with diminished lung sounds otherwise clear. Continue current dosage of inhalers and nebulizers with recommended follow-up with pulmonology.

## 2017-06-17 NOTE — Patient Instructions (Addendum)
Thank you for choosing [facility].  SUMMARY AND INSTRUCTIONS:  Please continue to take your medications as prescribed.  Work on your nutritional intake the best you can working to get as much protein and calories that you can tolerate.  Follow up with Eric Form, NP or Dr. Lamonte Sakai for your pulmonology follow-up.  Follow up with [place] in 1 month.  Medication:  Your prescription(s) have been submitted to your pharmacy or been printed and provided for you. Please take as directed and contact our office if you believe you are having problem(s) with the medication(s) or have any questions.                                                                                                                        Follow up:  If your symptoms worsen or fail to improve, please contact our office for further instruction, or in case of emergency go directly to the emergency room at the closest medical facility.

## 2017-06-17 NOTE — Assessment & Plan Note (Signed)
Significant protein calorie malnutrition related to decreased intake and prolonged hospitalization. Continue eating frequent small meals color clear dense and high in protein. Nutritional supplementation as needed. Follow-up in one month to recheck weight.

## 2017-06-17 NOTE — Assessment & Plan Note (Signed)
Continues to experience anxiety likely related to her living situation and family stressors. Continue current dosage of alprazolam and sertraline. Continue to monitor.

## 2017-06-17 NOTE — Progress Notes (Signed)
Subjective:    Patient ID: Megan [name], female    DOB: 01-14-1951, 66 y.o.   MRN: [id number]  No chief complaint on file.   HPI:  Megan [name] is a 66 y.o. female who  has a past medical history of Anemia; Anxiety state (08/20/2015); CAP (community acquired pneumonia); CHF (congestive heart failure) ([place]); COPD (chronic obstructive pulmonary disease) ([place]); Depression; Essential hypertension (08/19/2015); GERD (gastroesophageal reflux disease); Headache; History of hiatal hernia; Myocardial infarction ([place]); and Shortness of breath dyspnea. and presents today for a hospitalization follow up.  Recently evaluated in the hospital with the chief complaint of shortness of breath brought in via EMS. She was noted to have oxygen saturations of 88% on arrival with a cyanotic appearance. She received 2 DuoNeb's and 125 mg of IV Solu-Medrol. Upon arrival she appeared to be in either atrial fibrillation or SVT. She was kept on BiPAP and an ABG was obtained. Her x-ray showed bibasilar infiltrates consistent with pneumonia and was started on a 30 mL/kg IV fluid bolus with broad-spectrum antibiotics with concern for sepsis. She was started on a Cardizem drip following 2 attempts with adenosine to break SVT. Decision was made to intubate secondary to worsening ABG on BiPAP with respiratory acidosis.She was diagnosed with acute on chronic hypercarbic respiratory failure secondary to COPD and pneumonia. She completed a course of antibiotics. She did receive a tracheostomy that has since been reversed. She was also noted to have sinus tachycardia of unknown origin. She was transferred to a skilled nursing facility and will dyspneic with CO2 up to 103 on admission and down to 70 on BiPAP. Chest x-ray showed no infiltrates at that time she was started on a prednisone burst and azithromycin. Was recommended for physical therapy 5 times weekly. She was discharged from outside hospital to her skilled nursing facility. All  hospital records, labs, and imaging have been reviewed in detail.  Since leaving the skilled nursing facility in the hospital she reports that she has been gaining strength and continues to experience elevations in her heart rate if she moves around too fast causing her to have shortness of breath. Denies fevers or symptoms of COPD exacerbation. Expresses that she is under significant amount of stress within her current living situation secondary to family stressors. She reports taking her medication as prescribed and denies adverse side effects. Notes she is up and moving the best that she possibly can a declines physical therapy. Able to complete most activities of daily living independently. Remains on continuous oxygen.   Wt Readings from Last 3 Encounters:  06/17/17 83 lb (37.6 kg)  03/08/17 94 lb 9.6 oz (42.9 kg)  01/24/17 94 lb 9.6 oz (42.9 kg)     Allergies  Allergen Reactions  . Fentanyl And Related     Behavioral changes per daughter  . Penicillins Rash    Has patient had a PCN reaction causing immediate rash, facial/tongue/throat swelling, SOB or lightheadedness with hypotension: No Has patient had a PCN reaction causing severe rash involving mucus membranes or skin necrosis:NO Has patient had a PCN reaction that required hospitalization No Has patient had a PCN reaction occurring within the last 10 years:NO If all of the above answers are "NO", then may proceed with Cephalosporin use.      Outpatient Medications Prior to Visit  Medication Sig Dispense Refill  . ALPRAZolam (XANAX) 0.25 MG tablet Take 1 tablet (0.25 mg total) by mouth 2 (two) times daily as needed for anxiety. 60 tablet 0  .  aspirin EC 81 MG tablet Take 81 mg by mouth daily.    . budesonide (PULMICORT) 0.5 MG/2ML nebulizer solution Take 2 mLs (0.5 mg total) by nebulization 2 (two) times daily. 120 mL 0  . butalbital-acetaminophen-caffeine (FIORICET, ESGIC) 50-325-40 MG tablet Take 1 tablet by mouth 2 (two)  times daily as needed for headache. 15 tablet 0  . diltiazem (CARDIZEM CD) 240 MG 24 hr capsule Take 1 capsule (240 mg total) by mouth daily. 30 capsule 0  . feeding supplement, ENSURE ENLIVE, (ENSURE ENLIVE) LIQD Take 237 mLs by mouth 4 (four) times daily. 237 mL 0  . fluticasone (FLONASE) 50 MCG/ACT nasal spray Place 2 sprays into both nostrils daily. 16 g 0  . Fluticasone-Salmeterol (ADVAIR) 500-50 MCG/DOSE AEPB Inhale 1 puff into the lungs 2 (two) times daily. 60 each 3  . INCRUSE ELLIPTA 62.5 MCG/INH AEPB INHALE 1 PUFF INTO THE LUNGS DAILY 30 each 2  . levalbuterol (XOPENEX) 0.63 MG/3ML nebulizer solution Take 3 mLs (0.63 mg total) by nebulization every 4 (four) hours as needed for wheezing. 3 mL 12  . levalbuterol (XOPENEX) 1.25 MG/0.5ML nebulizer solution Take 1.25 mg by nebulization every 6 (six) hours. 1 each 12  . loratadine (CLARITIN) 10 MG tablet Take 1 tablet (10 mg total) by mouth daily. 30 tablet 0  . pantoprazole (PROTONIX) 40 MG tablet Take 1 tablet (40 mg total) by mouth daily at 6 (six) AM. 30 tablet 0  . polyethylene glycol (MIRALAX / GLYCOLAX) packet Take 17 g by mouth 2 (two) times daily. 14 each 0  . albuterol (PROVENTIL HFA;VENTOLIN HFA) 108 (90 Base) MCG/ACT inhaler Inhale 1-2 puffs into the lungs every 6 (six) hours as needed for wheezing or shortness of breath.    . clonazePAM (KLONOPIN) 1 MG tablet Place 1 tablet (1 mg total) into feeding tube 2 (two) times daily. 30 tablet 0  . levalbuterol (XOPENEX HFA) 45 MCG/ACT inhaler Inhale 1-2 puffs into the lungs every 4 (four) hours as needed for wheezing. 1 Inhaler 3  . predniSONE (DELTASONE) 5 MG tablet Take 1 tablet (5 mg total) by mouth daily with breakfast. 1 tablet 0  . sertraline (ZOLOFT) 25 MG tablet Take 1 tablet (25 mg total) by mouth daily. 30 tablet 0   No facility-administered medications prior to visit.       Past Surgical History:  Procedure Laterality Date  . CARDIAC CATHETERIZATION    . CARDIAC  CATHETERIZATION N/A 08/26/2015   Procedure: Left Heart Cath and Coronary Angiography;  Surgeon: Jettie Booze, MD;  Location: [facility];  Service: Cardiovascular;  Laterality: N/A;      Past Medical History:  Diagnosis Date  . Anemia   . Anxiety state 08/20/2015  . CAP (community acquired pneumonia)   . CHF (congestive heart failure) ([place])   . COPD (chronic obstructive pulmonary disease) ([place])   . Depression   . Essential hypertension 08/19/2015  . GERD (gastroesophageal reflux disease)   . Headache   . History of hiatal hernia   . Myocardial infarction ([place])   . Shortness of breath dyspnea       Review of Systems  Constitutional: Negative for chills and fever.  Respiratory: Positive for shortness of breath. Negative for chest tightness and wheezing.   Cardiovascular: Negative for chest pain, palpitations and leg swelling.  Gastrointestinal: Negative for abdominal distention, constipation, diarrhea, nausea and vomiting.  Psychiatric/Behavioral: Negative for dysphoric mood and sleep disturbance. The patient is nervous/anxious.  Objective:    BP 112/60 (BP Location: Right Arm, Patient Position: Sitting, Cuff Size: Normal)   Pulse 63   Temp 98.5 F (36.9 C) (Oral)   Resp 16   Ht 5\' 3"  (1.6 m)   Wt 83 lb (37.6 kg)   SpO2 97%   BMI 14.70 kg/m  Nursing note and vital signs reviewed.  Physical Exam  Constitutional: She is oriented to person, place, and time. She appears cachectic. No distress.  Cardiovascular: Normal rate, regular rhythm, normal heart sounds and intact distal pulses.   Pulmonary/Chest: Effort normal and breath sounds normal.  Neurological: She is alert and oriented to person, place, and time.  Skin: Skin is warm and dry.  Psychiatric: She has a normal mood and affect. Her behavior is normal. Judgment and thought content normal.       Assessment & Plan:   Problem List Items Addressed This Visit      Respiratory   COPD (chronic  obstructive pulmonary disease) ([place]) - Primary    Stable with current medication regimen and no adverse side effects or evidence of exacerbation. She can continues to remain on oxygen via nasal cannula. Pulmonary exam with diminished lung sounds otherwise clear. Continue current dosage of inhalers and nebulizers with recommended follow-up with pulmonology.      Relevant Medications   levalbuterol (XOPENEX HFA) 45 MCG/ACT inhaler     Other   Protein-calorie malnutrition, severe    Significant protein calorie malnutrition related to decreased intake and prolonged hospitalization. Continue eating frequent small meals color clear dense and high in protein. Nutritional supplementation as needed. Follow-up in one month to recheck weight.      Anxiety    Continues to experience anxiety likely related to her living situation and family stressors. Continue current dosage of alprazolam and sertraline. Continue to monitor.      Relevant Medications   sertraline (ZOLOFT) 25 MG tablet   Tachycardia    Continues to experience tachycardia of undetermined origin and maintained on metoprolol for rate control. No adverse side effects. Continue to monitor.          I have discontinued Ms. Broadwater's albuterol, clonazePAM, and predniSONE. I am also having her start on metoprolol tartrate. Additionally, I am having her maintain her Fluticasone-Salmeterol, INCRUSE ELLIPTA, ALPRAZolam, aspirin EC, butalbital-acetaminophen-caffeine, diltiazem, feeding supplement (ENSURE ENLIVE), fluticasone, loratadine, pantoprazole, polyethylene glycol, budesonide, levalbuterol, levalbuterol, levalbuterol, and sertraline.   Meds ordered this encounter  Medications  . levalbuterol (XOPENEX HFA) 45 MCG/ACT inhaler    Sig: Inhale 1-2 puffs into the lungs every 4 (four) hours as needed for wheezing.    Dispense:  1 Inhaler    Refill:  3    Order Specific Question:   Supervising Provider    Answer:   Pricilla Holm A  [[id number]]  . sertraline (ZOLOFT) 25 MG tablet    Sig: Take 1 tablet (25 mg total) by mouth daily.    Dispense:  30 tablet    Refill:  0    Order Specific Question:   Supervising Provider    Answer:   Pricilla Holm A [[id number]]  . metoprolol tartrate (LOPRESSOR) 25 MG tablet    Sig: Take 0.5 tablets (12.5 mg total) by mouth 2 (two) times daily.    Dispense:  90 tablet    Refill:  0    Order Specific Question:   Supervising Provider    Answer:   Pricilla Holm A [[id number]]     Follow-up: Return in about 1 month (  around 07/18/2017), or if symptoms worsen or fail to improve.  Mauricio Po, FNP

## 2017-06-18 ENCOUNTER — Other Ambulatory Visit: Payer: Self-pay | Admitting: Family

## 2017-06-21 ENCOUNTER — Other Ambulatory Visit: Payer: Self-pay | Admitting: Family

## 2017-06-21 MED ORDER — XOPENEX HFA 45 MCG/ACT IN AERO: 1.0000 | INHALATION_SPRAY | RESPIRATORY_TRACT | 12 refills | Status: DC | PRN

## 2017-06-23 DIAGNOSIS — I10 Essential (primary) hypertension: Secondary | ICD-10-CM | POA: Diagnosis not present

## 2017-06-23 DIAGNOSIS — J9612 Chronic respiratory failure with hypercapnia: Secondary | ICD-10-CM | POA: Diagnosis not present

## 2017-06-23 DIAGNOSIS — F419 Anxiety disorder, unspecified: Secondary | ICD-10-CM | POA: Diagnosis not present

## 2017-06-23 DIAGNOSIS — K219 Gastro-esophageal reflux disease without esophagitis: Secondary | ICD-10-CM | POA: Diagnosis not present

## 2017-06-23 DIAGNOSIS — F339 Major depressive disorder, recurrent, unspecified: Secondary | ICD-10-CM | POA: Diagnosis not present

## 2017-06-23 DIAGNOSIS — J441 Chronic obstructive pulmonary disease with (acute) exacerbation: Secondary | ICD-10-CM | POA: Diagnosis not present

## 2017-07-06 ENCOUNTER — Ambulatory Visit: Payer: Medicare Other | Admitting: Adult Health

## 2017-07-07 ENCOUNTER — Ambulatory Visit (INDEPENDENT_AMBULATORY_CARE_PROVIDER_SITE_OTHER): Payer: Medicare Other | Admitting: Acute Care

## 2017-07-07 ENCOUNTER — Encounter: Payer: Self-pay | Admitting: Acute Care

## 2017-07-07 ENCOUNTER — Ambulatory Visit (INDEPENDENT_AMBULATORY_CARE_PROVIDER_SITE_OTHER)
Admission: RE | Admit: 2017-07-07 | Discharge: 2017-07-07 | Disposition: A | Discharge status: Home / Self Care | Payer: Medicare Other | Source: Ambulatory Visit | Attending: Acute Care | Admitting: Acute Care

## 2017-07-07 VITALS — BP 100/58 | HR 100 | Ht 63.0 in | Wt 84.0 lb

## 2017-07-07 DIAGNOSIS — R05 Cough: Secondary | ICD-10-CM | POA: Diagnosis not present

## 2017-07-07 DIAGNOSIS — J441 Chronic obstructive pulmonary disease with (acute) exacerbation: Secondary | ICD-10-CM | POA: Diagnosis not present

## 2017-07-07 DIAGNOSIS — J9622 Acute and chronic respiratory failure with hypercapnia: Secondary | ICD-10-CM | POA: Diagnosis not present

## 2017-07-07 DIAGNOSIS — J439 Emphysema, unspecified: Secondary | ICD-10-CM

## 2017-07-07 MED ORDER — PREDNISONE 10 MG PO TABS: ORAL_TABLET | ORAL | 0 refills | Status: DC

## 2017-07-07 MED ORDER — LORATADINE 10 MG PO TABS: 10.0000 mg | ORAL_TABLET | Freq: Every day | ORAL | 3 refills | Status: DC

## 2017-07-07 MED ORDER — LORATADINE 10 MG PO TABS: 10.0000 mg | ORAL_TABLET | Freq: Every day | ORAL | 0 refills | Status: DC

## 2017-07-07 MED ORDER — AZITHROMYCIN 250 MG PO TABS: ORAL_TABLET | ORAL | 0 refills | Status: DC

## 2017-07-07 MED ORDER — UMECLIDINIUM BROMIDE 62.5 MCG/INH IN AEPB
1.0000 | INHALATION_SPRAY | Freq: Every day | RESPIRATORY_TRACT | 2 refills | Status: DC
Start: 2017-07-07 — End: 2017-11-01

## 2017-07-07 MED ORDER — XOPENEX HFA 45 MCG/ACT IN AERO: 1.0000 | INHALATION_SPRAY | RESPIRATORY_TRACT | 3 refills | Status: DC | PRN

## 2017-07-07 NOTE — Assessment & Plan Note (Signed)
Mild flare Patient not compliance with her incruse Plan CXR today We will call you with results. Prednisone taper; 10 mg tablets: 4 tabs x 3 days, 3 tabs x 3 days, 2 tabs x 3 days 1 tab x 3 days then stop. Z-pack Continue oxygen to maintain oxygen saturations between 88-92%. Please let us know if you would like [place] to refer you to Pulmonary Rehab. Continue using Ensure meal supplements. Continue your Advair and Incruse. We will send in a prescription for Incruse We will renew your prescription for Claritin ( Loratidine) Xopenex rescue inhaler as needed up to every 6 hours as needed for shortness of breath or wheezing. Add Mucinex 1200 mg once daily I am  for chest congestion Take with a full glass of water. Follow up appointment in 2 weeks with Judson Roch NP or Dr. Lamonte Sakai Please contact office for sooner follow up if symptoms do not improve or worsen or seek emergency care

## 2017-07-07 NOTE — Patient Instructions (Addendum)
It is good to see you today. CXR today We will call you with results. Prednisone taper; 10 mg tablets: 4 tabs x 3 days, 3 tabs x 3 days, 2 tabs x 3 days 1 tab x 3 days then stop. Z-pack Continue oxygen to maintain oxygen saturations between 88-92%. Please let us know if you would like [place] to refer you to Pulmonary Rehab. Continue using Ensure meal supplements. Continue your Advair and Incruse. We will send in a prescription for Incruse We will renew your prescription for Claritin ( Loratidine) Xopenex rescue inhaler as needed up to every 6 hours as needed for shortness of breath or wheezing. Add Mucinex 1200 mg once daily I am  for chest congestion Take with a full glass of water. Follow up appointment in 2 weeks with Judson Roch NP or Dr. Lamonte Sakai Please contact office for sooner follow up if symptoms do not improve or worsen or seek emergency care

## 2017-07-07 NOTE — Progress Notes (Signed)
History of Present Illness Megan [name] is a 66 y.o. female former  Smoker quit 2001, with severe anxiety,  COPD, on home oxygen. She is followed by Megan [name].   07/07/2017 Acute OV: Pt. Presents for acute COPD exacerbation. She states she has been short of breath. She has a cough. She states her cough is worse. She states this has been going on for afew days. She states she had run out of her Incruse. She states she is compliant with her Advair.She is coughing up yellow secretions.She states she has had some chills, but has not taken her temperature. She is wheezing. She is using her Xopenex.She is out of her Claritin also, and needs a prescription.She denies chest pain, orthopnea or hemoptysis.  Test Results:  CBC Latest Ref Rng & Units 04/03/2017 03/19/2017 03/15/2017  WBC 4.0 - 10.5 K/uL 6.4 7.8 9.1  Hemoglobin 12.0 - 15.0 g/dL 11.0(L) 9.3(L) 9.3(L)  Hematocrit 36.0 - 46.0 % 35.2(L) 29.9(L) 30.2(L)  Platelets 150 - 400 K/uL 379 567(H) 506(H)    BMP Latest Ref Rng & Units 04/03/2017 03/24/2017 03/19/2017  Glucose 65 - 99 mg/dL 80 128(H) 116(H)  BUN 6 - 20 mg/dL 20 23(H) 39(H)  Creatinine 0.44 - 1.00 mg/dL 0.56 0.66 0.73  Sodium 135 - 145 mmol/L 136 137 136  Potassium 3.5 - 5.1 mmol/L 4.1 4.1 4.7  Chloride 101 - 111 mmol/L 97(L) 89(L) 93(L)  CO2 22 - 32 mmol/L 35(H) 42(H) 35(H)  Calcium 8.9 - 10.3 mg/dL 8.8(L) 9.3 8.9    BNP    Component Value Date/Time   BNP 638.1 (H) 02/20/2017 0109      Past medical hx Past Medical History:  Diagnosis Date  . Anemia   . Anxiety state 08/20/2015  . CAP (community acquired pneumonia)   . CHF (congestive heart failure) ([place])   . COPD (chronic obstructive pulmonary disease) ([place])   . Depression   . Essential hypertension 08/19/2015  . GERD (gastroesophageal reflux disease)   . Headache   . History of hiatal hernia   . Myocardial infarction ([place])   . Shortness of breath dyspnea      Social History  Substance Use Topics  . Smoking  status: Former Smoker    Packs/day: 1.00    Years: 30.00    Types: Cigarettes    Quit date: 08/18/2000  . Smokeless tobacco: Never Used  . Alcohol use No    Megan [name] reports that she quit smoking about 16 years ago. Her smoking use included Cigarettes. She has a 30.00 pack-year smoking history. She has never used smokeless tobacco. She reports that she does not drink alcohol or use drugs.  Tobacco Cessation: Former smoker quit 2001  Past surgical hx, Family hx, Social hx all reviewed.  Current Outpatient Prescriptions on File Prior to Visit  Medication Sig  . ALPRAZolam (XANAX) 0.25 MG tablet Take 1 tablet (0.25 mg total) by mouth 2 (two) times daily as needed for anxiety.  Marland Kitchen aspirin EC 81 MG tablet Take 81 mg by mouth daily.  . butalbital-acetaminophen-caffeine (FIORICET, ESGIC) 50-325-40 MG tablet Take 1 tablet by mouth 2 (two) times daily as needed for headache.  . Butalbital-APAP-Caffeine 50-300-40 MG CAPS TAKE ONE CAPSULE BY MOUTH EVERY 6 HOURS AS NEEDED FOR HEADACHE  . diltiazem (CARDIZEM CD) 240 MG 24 hr capsule Take 1 capsule (240 mg total) by mouth daily.  Marland Kitchen diltiazem (TIAZAC) 240 MG 24 hr capsule TAKE ONE CAPSULE BY MOUTH ONCE DAILY  . feeding supplement, ENSURE  ENLIVE, (ENSURE ENLIVE) LIQD Take 237 mLs by mouth 4 (four) times daily.  . fluticasone (FLONASE) 50 MCG/ACT nasal spray Place 2 sprays into both nostrils daily.  . Fluticasone-Salmeterol (ADVAIR) 500-50 MCG/DOSE AEPB Inhale 1 puff into the lungs 2 (two) times daily.  Marland Kitchen levalbuterol (XOPENEX) 0.63 MG/3ML nebulizer solution Take 3 mLs (0.63 mg total) by nebulization every 4 (four) hours as needed for wheezing.  . levalbuterol (XOPENEX) 1.25 MG/0.5ML nebulizer solution Take 1.25 mg by nebulization every 6 (six) hours.  . metoprolol tartrate (LOPRESSOR) 25 MG tablet Take 0.5 tablets (12.5 mg total) by mouth 2 (two) times daily.  . pantoprazole (PROTONIX) 40 MG tablet TAKE 1 TABLET BY MOUTH EVERY DAY FOR  GASTRO-ESOPHAGEAL REFLUX DISEASE  . polyethylene glycol (MIRALAX / GLYCOLAX) packet Take 17 g by mouth 2 (two) times daily.  . sertraline (ZOLOFT) 25 MG tablet Take 1 tablet (25 mg total) by mouth daily.  . budesonide (PULMICORT) 0.5 MG/2ML nebulizer solution Take 2 mLs (0.5 mg total) by nebulization 2 (two) times daily.   No current facility-administered medications on file prior to visit.      Allergies  Allergen Reactions  . Fentanyl And Related     Behavioral changes per daughter  . Penicillins Rash    Has patient had a PCN reaction causing immediate rash, facial/tongue/throat swelling, SOB or lightheadedness with hypotension: No Has patient had a PCN reaction causing severe rash involving mucus membranes or skin necrosis:NO Has patient had a PCN reaction that required hospitalization No Has patient had a PCN reaction occurring within the last 10 years:NO If all of the above answers are "NO", then may proceed with Cephalosporin use.    Review Of Systems:  Constitutional:   No  weight loss, night sweats,  +Fevers, +chills, no fatigue, or  lassitude.  HEENT:   No headaches,  Difficulty swallowing,  Tooth/dental problems, or  Sore throat,                No sneezing, itching, ear ache, +nasal congestion, +post nasal drip,   CV:  No chest pain,  Orthopnea, PND, swelling in lower extremities, anasarca, dizziness, palpitations, syncope.   GI  No heartburn, indigestion, abdominal pain, nausea, vomiting, diarrhea, change in bowel habits, loss of appetite, bloody stools.   Resp: + shortness of breath with exertion or at rest.  + excess mucus, no productive cough,  No non-productive cough,  No coughing up of blood.  + change in color of mucus.  + wheezing.  No chest wall deformity  Skin: no rash or lesions.  GU: no dysuria, change in color of urine, no urgency or frequency.  No flank pain, no hematuria   MS:  No joint pain or swelling.  No decreased range of motion.  No back  pain.  Psych:  No change in mood or affect. No depression or anxiety.  No memory loss.   Vital Signs BP (!) 100/58 (BP Location: Left Arm, Cuff Size: Normal)   Pulse 100   Ht 5\' 3"  (1.6 m)   Wt 84 lb (38.1 kg)   SpO2 95%   BMI 14.88 kg/m    Physical Exam:  General- No distress,  A&Ox3, frail elderly lady in a wheelchair wearing nasal cannula oxygen ENT: No sinus tenderness, TM clear, pale nasal mucosa, no oral exudate,+ post nasal drip, no LAN Cardiac: S1, S2, regular rate and rhythm, no murmur Chest: + wheeze/no  rales/ dullness; no accessory muscle use, no nasal flaring, no sternal retractions  Abd.: Soft Non-tender, nondistended Ext: No clubbing cyanosis, edema Neuro:  Cranial nerves intact,Deconditioned  Skin: No rashes, warm and dry Psych: normal mood and behavior   Assessment/Plan  COPD exacerbation (HCC) Mild flare Patient not compliance with her incruse Plan CXR today We will call you with results. Prednisone taper; 10 mg tablets: 4 tabs x 3 days, 3 tabs x 3 days, 2 tabs x 3 days 1 tab x 3 days then stop. Z-pack Continue oxygen to maintain oxygen saturations between 88-92%. Please let us know if you would like [place] to refer you to Pulmonary Rehab. Continue using Ensure meal supplements. Continue your Advair and Incruse. We will send in a prescription for Incruse We will renew your prescription for Claritin ( Loratidine) Xopenex rescue inhaler as needed up to every 6 hours as needed for shortness of breath or wheezing. Add Mucinex 1200 mg once daily I am  for chest congestion Take with a full glass of water. Follow up appointment in 2 weeks with Judson Roch NP or Megan [name] Please contact office for sooner follow up if symptoms do not improve or worsen or seek emergency care    Acute on chronic respiratory failure with hypercapnia ([place]) Continue wearing oxygen at 2-3 L to maintain saturations 88-92%    Magdalen Spatz, NP 07/07/2017  11:04 PM

## 2017-07-07 NOTE — Assessment & Plan Note (Signed)
Continue wearing oxygen at 2-3 L to maintain saturations 88-92%

## 2017-07-13 ENCOUNTER — Telehealth: Payer: Self-pay | Admitting: Acute Care

## 2017-07-13 MED ORDER — PREDNISONE 10 MG PO TABS: 10.0000 mg | ORAL_TABLET | Freq: Every day | ORAL | 0 refills | Status: DC

## 2017-07-13 NOTE — Telephone Encounter (Signed)
Per SG okay to prescribe 10mg  tablet x 4 days. Rx has been sent to preferred pharmacy. Nothing further needed.

## 2017-07-13 NOTE — Telephone Encounter (Signed)
Called and spoke with pt. Pt seen SG for acute visit. Pt was prescribed prednisone taper. Pt is requesting taper to be extended, incase power goes out during strom causing her breathing to worsen Pt states breathing is improving with current taper. Pt states she has 6 days left. preferred pharmacy [facility].   SG please advise.  Thanks.

## 2017-07-21 ENCOUNTER — Ambulatory Visit: Payer: Medicare Other | Admitting: Acute Care

## 2017-07-29 ENCOUNTER — Other Ambulatory Visit: Payer: Self-pay | Admitting: Family

## 2017-08-05 ENCOUNTER — Telehealth: Payer: Self-pay | Admitting: Internal Medicine

## 2017-08-05 NOTE — Telephone Encounter (Signed)
Called on beeper reporting sob at rest p neb lmom :  Go to ER

## 2017-08-13 ENCOUNTER — Other Ambulatory Visit: Payer: Self-pay | Admitting: Family

## 2017-08-13 DIAGNOSIS — F4312 Post-traumatic stress disorder, chronic: Secondary | ICD-10-CM

## 2017-08-15 NOTE — Telephone Encounter (Signed)
Pt is transffering to [facility] on 10/30 Also needs a refill of XOPENEX HFA 45 MCG/ACT inhaler  and  diltiazem (CARDIZEM CD) 240 MG 24 hr capsule  Please advise  [facility] on [address]

## 2017-08-15 NOTE — Telephone Encounter (Signed)
Done erx except xanax Done hardcopy to [employer]

## 2017-08-15 NOTE — Telephone Encounter (Signed)
Pt needs to establish care with new PCP for more refills.

## 2017-08-16 NOTE — Telephone Encounter (Signed)
Faxed

## 2017-08-30 ENCOUNTER — Ambulatory Visit: Payer: Medicare Other | Admitting: Internal Medicine

## 2017-09-12 ENCOUNTER — Telehealth: Payer: Self-pay | Admitting: Internal Medicine

## 2017-09-12 DIAGNOSIS — F4312 Post-traumatic stress disorder, chronic: Secondary | ICD-10-CM

## 2017-09-12 MED ORDER — ALPRAZOLAM 0.25 MG PO TABS: 0.2500 mg | ORAL_TABLET | Freq: Two times a day (BID) | ORAL | 0 refills | Status: DC | PRN

## 2017-09-12 NOTE — Telephone Encounter (Signed)
Just noticed the script you gave me is what the patient is already taking. Please advise.

## 2017-09-12 NOTE — Telephone Encounter (Signed)
Per JJ medication will not be changed until after establishing with him.

## 2017-09-12 NOTE — Telephone Encounter (Signed)
Patient called stating that she lost a grand baby last night.  States she is currently on xanax but states it is not helping the anxiety and stress she has right now.  Would like to know if something else can be called in or if a stronger script of xanax can be called in?  Patient states she wants something to get her through this week.  Patient has appt scheduled for Friday but does not know if she will be able to make the appointment yet but will reschedule.

## 2017-09-12 NOTE — Telephone Encounter (Signed)
Sorry I cannot change without OV as this is a controlled substance and I have never met this pt before in the office

## 2017-09-12 NOTE — Telephone Encounter (Signed)
Done hardcopy to [place]  

## 2017-09-13 NOTE — Telephone Encounter (Signed)
Patient has called in.  I have give patient MD response.

## 2017-09-16 ENCOUNTER — Ambulatory Visit (INDEPENDENT_AMBULATORY_CARE_PROVIDER_SITE_OTHER): Payer: Medicare Other | Admitting: Internal Medicine

## 2017-09-16 ENCOUNTER — Encounter: Payer: Self-pay | Admitting: Internal Medicine

## 2017-09-16 ENCOUNTER — Other Ambulatory Visit (INDEPENDENT_AMBULATORY_CARE_PROVIDER_SITE_OTHER): Payer: Medicare Other

## 2017-09-16 ENCOUNTER — Other Ambulatory Visit: Payer: Self-pay | Admitting: Internal Medicine

## 2017-09-16 ENCOUNTER — Telehealth: Payer: Self-pay | Admitting: Internal Medicine

## 2017-09-16 VITALS — BP 116/66 | HR 89 | Temp 98.5°F | Ht 63.0 in | Wt 85.0 lb

## 2017-09-16 DIAGNOSIS — R5383 Other fatigue: Secondary | ICD-10-CM | POA: Diagnosis not present

## 2017-09-16 DIAGNOSIS — I10 Essential (primary) hypertension: Secondary | ICD-10-CM | POA: Diagnosis not present

## 2017-09-16 DIAGNOSIS — Z23 Encounter for immunization: Secondary | ICD-10-CM

## 2017-09-16 DIAGNOSIS — R739 Hyperglycemia, unspecified: Secondary | ICD-10-CM | POA: Diagnosis not present

## 2017-09-16 DIAGNOSIS — R51 Headache: Secondary | ICD-10-CM | POA: Diagnosis not present

## 2017-09-16 DIAGNOSIS — J441 Chronic obstructive pulmonary disease with (acute) exacerbation: Secondary | ICD-10-CM | POA: Diagnosis not present

## 2017-09-16 DIAGNOSIS — M797 Fibromyalgia: Secondary | ICD-10-CM

## 2017-09-16 DIAGNOSIS — R519 Headache, unspecified: Secondary | ICD-10-CM

## 2017-09-16 LAB — CBC WITH DIFFERENTIAL/PLATELET
BASOS ABS: 0 10*3/uL (ref 0.0–0.1)
Basophils Relative: 0.5 % (ref 0.0–3.0)
EOS PCT: 0.6 % (ref 0.0–5.0)
Eosinophils Absolute: 0.1 10*3/uL (ref 0.0–0.7)
HEMATOCRIT: 35.3 % — AB (ref 36.0–46.0)
HEMOGLOBIN: 11.6 g/dL — AB (ref 12.0–15.0)
LYMPHS PCT: 17 % (ref 12.0–46.0)
Lymphs Abs: 1.5 10*3/uL (ref 0.7–4.0)
MCHC: 32.9 g/dL (ref 30.0–36.0)
MCV: 96.4 fl (ref 78.0–100.0)
MONOS PCT: 12.9 % — AB (ref 3.0–12.0)
Monocytes Absolute: 1.2 10*3/uL — ABNORMAL HIGH (ref 0.1–1.0)
NEUTROS PCT: 69 % (ref 43.0–77.0)
Neutro Abs: 6.1 10*3/uL (ref 1.4–7.7)
Platelets: 285 10*3/uL (ref 150.0–400.0)
RBC: 3.66 Mil/uL — AB (ref 3.87–5.11)
RDW: 12.4 % (ref 11.5–15.5)
WBC: 8.9 10*3/uL (ref 4.0–10.5)

## 2017-09-16 LAB — TSH: TSH: 1.31 u[IU]/mL (ref 0.35–4.50)

## 2017-09-16 LAB — HEPATIC FUNCTION PANEL
ALBUMIN: 4 g/dL (ref 3.5–5.2)
ALK PHOS: 69 U/L (ref 39–117)
ALT: 11 U/L (ref 0–35)
AST: 17 U/L (ref 0–37)
Bilirubin, Direct: 0 mg/dL (ref 0.0–0.3)
TOTAL PROTEIN: 6.9 g/dL (ref 6.0–8.3)
Total Bilirubin: 0.4 mg/dL (ref 0.2–1.2)

## 2017-09-16 LAB — BASIC METABOLIC PANEL
BUN: 10 mg/dL (ref 6–23)
CALCIUM: 9.3 mg/dL (ref 8.4–10.5)
CO2: 32 meq/L (ref 19–32)
CREATININE: 0.67 mg/dL (ref 0.40–1.20)
Chloride: 97 mEq/L (ref 96–112)
GFR: 94 mL/min (ref >60.00)
Glucose, Bld: 90 mg/dL (ref 70–99)
Potassium: 3.9 mEq/L (ref 3.5–5.1)
Sodium: 139 mEq/L (ref 135–145)

## 2017-09-16 MED ORDER — PREDNISONE 10 MG PO TABS: ORAL_TABLET | ORAL | 0 refills | Status: DC

## 2017-09-16 MED ORDER — TIZANIDINE HCL 4 MG PO TABS: 4.0000 mg | ORAL_TABLET | Freq: Four times a day (QID) | ORAL | 1 refills | Status: DC | PRN

## 2017-09-16 MED ORDER — LORATADINE 10 MG PO TABS: 10.0000 mg | ORAL_TABLET | Freq: Every day | ORAL | 3 refills | Status: DC

## 2017-09-16 MED ORDER — BUTALBITAL-APAP-CAFFEINE 50-325-40 MG PO TABS: 1.0000 | ORAL_TABLET | Freq: Two times a day (BID) | ORAL | 0 refills | Status: DC | PRN

## 2017-09-16 MED ORDER — AZITHROMYCIN 250 MG PO TABS: ORAL_TABLET | ORAL | 0 refills | Status: DC

## 2017-09-16 NOTE — Patient Instructions (Addendum)
You had the Flu shot, Prevnar 13, and Tetanus (Tdap) shots today  Please take all new medication as prescribed - the muscle relaxer as needed, and the prednisone  Please continue all other medications as before, and refills have been done if requested.  Please have the pharmacy call with any other refills you may need.  Please continue your efforts at being more active, low cholesterol diet, and weight control.  You are otherwise up to date with prevention measures today.  Please keep your appointments with your specialists as you may have planned  Please go to the LAB in the Basement (turn left off the elevator) for the tests to be done today  You will be contacted by phone if any changes need to be made immediately.  Otherwise, you will receive a letter about your results with an explanation, but please check with MyChart first.  Please remember to sign up for MyChart if you have not done so, as this will be important to you in the future with finding out test results, communicating by private email, and scheduling acute appointments online when needed.  Please return in 6 months, or sooner if needed

## 2017-09-16 NOTE — Progress Notes (Signed)
   Subjective:    Patient ID: Megan [name], female    DOB: 1951-09-07, 66 y.o.   MRN: [id number]  HPI    Plans to call on her own foi GYn care and mammogram  Review of Systems     Objective:   Physical Exam BP 116/66   Pulse 89   Temp 98.5 F (36.9 C) (Oral)   Ht 5\' 3"  (1.6 m)   Wt 85 lb (38.6 kg)   SpO2 (!) 88%   BMI 15.06 kg/m  - home o2 2L       Assessment & Plan:

## 2017-09-16 NOTE — Telephone Encounter (Signed)
Pt called and forgot to tell JJ she needs a refill of her azithromycin (ZITHROMAX) 250 MG tablet Please advise

## 2017-09-16 NOTE — Telephone Encounter (Signed)
Done erx 

## 2017-09-17 ENCOUNTER — Encounter: Payer: Self-pay | Admitting: Internal Medicine

## 2017-09-17 NOTE — Assessment & Plan Note (Signed)
Lab Results  Component Value Date   HGBA1C 5.3 01/05/2017  stable overall by history and exam, recent data reviewed with pt, and pt to continue medical treatment as before,  to f/u any worsening symptoms or concerns

## 2017-09-17 NOTE — Progress Notes (Signed)
Subjective:    Patient ID: Megan [name], female    DOB: October 29, 1951, 66 y.o.   MRN: [id number]  HPI  Here to f/u with new PCP, incidentally Here with acute onset mild to mod 2-3 days ST, HA, general weakness and malaise, with prod cough greenish sputum with worsening wheezing, sob/ doe, but Pt denies chest pain, orthopnea, PND, increased LE swelling, palpitations, dizziness or syncope.  Pt denies new neurological symptoms such as new headache, or facial or extremity weakness or numbness   Pt denies polydipsia, polyuria  Plans to call on her own foi GYn care and mammogram.  Has no tobacco x 15 yrs.  Has "pain all over" with tender spots to upper and lower back and extremities.  Does c/o ongoing fatigue, but denies signficant daytime hypersomnolence. Due for flu shot, prevnar, Tdap Past Medical History:  Diagnosis Date  . Anemia   . Anxiety state 08/20/2015  . CAP (community acquired pneumonia)   . CHF (congestive heart failure) ([place])   . COPD (chronic obstructive pulmonary disease) ([place])   . Depression   . Essential hypertension 08/19/2015  . GERD (gastroesophageal reflux disease)   . Headache   . History of hiatal hernia   . Myocardial infarction ([place])   . Shortness of breath dyspnea    Past Surgical History:  Procedure Laterality Date  . CARDIAC CATHETERIZATION    . Left Heart Cath and Coronary Angiography N/A 08/26/2015   Performed by Jettie Booze, MD at [facility]    reports that she quit smoking about 17 years ago. Her smoking use included cigarettes. She has a 30.00 pack-year smoking history. she has never used smokeless tobacco. She reports that she does not drink alcohol or use drugs. family history includes Cirrhosis in her mother; Depression in her mother; Heart disease in her father. Allergies  Allergen Reactions  . Fentanyl And Related     Behavioral changes per daughter  . Penicillins Rash    Has patient had a PCN reaction causing immediate rash,  facial/tongue/throat swelling, SOB or lightheadedness with hypotension: No Has patient had a PCN reaction causing severe rash involving mucus membranes or skin necrosis:NO Has patient had a PCN reaction that required hospitalization No Has patient had a PCN reaction occurring within the last 10 years:NO If all of the above answers are "NO", then may proceed with Cephalosporin use.   Current Outpatient Medications on File Prior to Visit  Medication Sig Dispense Refill  . ADVAIR DISKUS 500-50 MCG/DOSE AEPB INHALE 1 PUFF BY MOUTH 2 TIMES DAILY FOR COPD** RINSE MOUTH WITH WATER AFTER USE** DO NOT SWALLOW 3 each 0  . ALPRAZolam (XANAX) 0.25 MG tablet Take 1 tablet (0.25 mg total) 2 (two) times daily as needed by mouth. for anxiety 60 tablet 0  . aspirin EC 81 MG tablet Take 81 mg by mouth daily.    . Butalbital-APAP-Caffeine 50-300-40 MG CAPS TAKE ONE CAPSULE BY MOUTH EVERY 6 HOURS AS NEEDED FOR HEADACHE 60 capsule 0  . diltiazem (CARDIZEM CD) 240 MG 24 hr capsule Take 1 capsule (240 mg total) by mouth daily. 30 capsule 0  . diltiazem (TIAZAC) 240 MG 24 hr capsule TAKE ONE CAPSULE BY MOUTH ONCE DAILY 90 capsule 0  . feeding supplement, ENSURE ENLIVE, (ENSURE ENLIVE) LIQD Take 237 mLs by mouth 4 (four) times daily. 237 mL 0  . fluticasone (FLONASE) 50 MCG/ACT nasal spray Place 2 sprays into both nostrils daily. 16 g 0  . levalbuterol (XOPENEX  HFA) 45 MCG/ACT inhaler INHALE 1 TO 2 PUFFS INTO THE LUNGS EVERY 4 HOURS AS NEEDED FOR WHEEZING 75 g 2  . levalbuterol (XOPENEX) 0.63 MG/3ML nebulizer solution Take 3 mLs (0.63 mg total) by nebulization every 4 (four) hours as needed for wheezing. 3 mL 12  . levalbuterol (XOPENEX) 1.25 MG/0.5ML nebulizer solution Take 1.25 mg by nebulization every 6 (six) hours. 1 each 12  . metoprolol tartrate (LOPRESSOR) 25 MG tablet Take 0.5 tablets (12.5 mg total) by mouth 2 (two) times daily. 90 tablet 0  . pantoprazole (PROTONIX) 40 MG tablet TAKE 1 TABLET BY MOUTH EVERY DAY  FOR GASTRO-ESOPHAGEAL REFLUX DISEASE 90 tablet 0  . polyethylene glycol (MIRALAX / GLYCOLAX) packet Take 17 g by mouth 2 (two) times daily. 14 each 0  . sertraline (ZOLOFT) 25 MG tablet TAKE 1 TABLET(25 MG) BY MOUTH DAILY 90 tablet 0  . umeclidinium bromide (INCRUSE ELLIPTA) 62.5 MCG/INH AEPB Inhale 1 puff into the lungs daily. 30 each 2  . XOPENEX HFA 45 MCG/ACT inhaler Inhale 1-2 puffs into the lungs every 4 (four) hours as needed for wheezing. 1 Inhaler 3  . budesonide (PULMICORT) 0.5 MG/2ML nebulizer solution Take 2 mLs (0.5 mg total) by nebulization 2 (two) times daily. 120 mL 0   No current facility-administered medications on file prior to visit.    Review of Systems  Constitutional: Negative for other unusual diaphoresis or sweats HENT: Negative for ear discharge or swelling Eyes: Negative for other worsening visual disturbances Respiratory: Negative for stridor or other swelling  Gastrointestinal: Negative for worsening distension or other blood Genitourinary: Negative for retention or other urinary change Musculoskeletal: Negative for other MSK pain or swelling Skin: Negative for color change or other new lesions Neurological: Negative for worsening tremors and other numbness  Psychiatric/Behavioral: Negative for worsening agitation  All other system neg per pt     Objective:   Physical Exam BP 116/66   Pulse 89   Temp 98.5 F (36.9 C) (Oral)   Ht 5\' 3"  (1.6 m)   Wt 85 lb (38.6 kg)   SpO2 (!) 88%   BMI 15.06 kg/m  - home o2 2L VS noted, mild ill Constitutional: Pt appears in NAD HENT: Head: NCAT.  Right Ear: External ear normal.  Left Ear: External ear normal.  Eyes: . Pupils are equal, round, and reactive to light. Conjunctivae and EOM are normal Nose: without d/c or deformity Neck: Neck supple. Gross normal ROM Cardiovascular: Normal rate and regular rhythm.   Pulmonary/Chest: Effort normal and breath sounds decreased without rales but with mild scattered diffuse  wheezing.  Abd:  Soft, NT, ND, + BS, no organomegaly Back with number of tender areas without swelling or rash Neurological: Pt is alert. At baseline orientation, motor grossly intact Skin: Skin is warm. No rashes, other new lesions, no LE edema Psychiatric: Pt behavior is nervous without agitation  No other exam findings    Assessment & Plan:

## 2017-09-17 NOTE — Assessment & Plan Note (Signed)
New dx, for muscle relaxer prn,  to f/u any worsening symptoms or concerns

## 2017-09-17 NOTE — Assessment & Plan Note (Signed)
Etiology unclear, Exam otherwise benign, to check labs as documented, follow with expectant management  

## 2017-09-17 NOTE — Assessment & Plan Note (Addendum)
Mild to mod, for antibx course, cough med prn, predpac asd, to f/u any worsening symptoms or concerns  Note:  Total time for pt hx, exam, review of record with pt in the room, determination of diagnoses and plan for further eval and tx is > 40 min, with over 50% spent in coordination and counseling of patient, including the differential dx, tx, further evaluation and other management of COPD exacerbation, HTN, hyperglycemia, FMS and fatigue

## 2017-09-17 NOTE — Assessment & Plan Note (Signed)
stable overall by history and exam, recent data reviewed with pt, and pt to continue medical treatment as before,  to f/u any worsening symptoms or concerns BP Readings from Last 3 Encounters:  09/16/17 116/66  07/07/17 (!) 100/58  06/17/17 112/60

## 2017-10-03 ENCOUNTER — Telehealth: Payer: Self-pay | Admitting: Internal Medicine

## 2017-10-03 NOTE — Telephone Encounter (Signed)
Pt  Requesting  A  Refill  Of  Prednisone

## 2017-10-03 NOTE — Telephone Encounter (Signed)
Copied from [place] [id number]. Topic: Quick Communication - See Telephone Encounter >> Oct 03, 2017  1:35 PM Bea Graff, NT wrote: CRM for notification. See Telephone encounter for: Patient calling and wants to see if she can have a refill of the prednisone. It is helping her a lot. She uses [facility] representative on [address].   10/03/17.

## 2017-10-04 MED ORDER — PREDNISONE 10 MG PO TABS: ORAL_TABLET | ORAL | 0 refills | Status: DC

## 2017-10-04 NOTE — Telephone Encounter (Signed)
Ok this time only  Would need OV with me or Pulmonary if not improved

## 2017-10-04 NOTE — Telephone Encounter (Signed)
Patient calling again today to see if she can get a refill of her prednisone. She uses [facility] representative on [address].

## 2017-10-04 NOTE — Telephone Encounter (Signed)
Called no answer LMOM w/MD response...Megan [name]

## 2017-10-25 ENCOUNTER — Other Ambulatory Visit: Payer: Self-pay | Admitting: Acute Care

## 2017-11-01 ENCOUNTER — Other Ambulatory Visit: Payer: Self-pay | Admitting: Acute Care

## 2017-11-07 ENCOUNTER — Other Ambulatory Visit: Payer: Self-pay | Admitting: Internal Medicine

## 2017-11-08 ENCOUNTER — Other Ambulatory Visit: Payer: Self-pay | Admitting: *Deleted

## 2017-11-08 MED ORDER — METOPROLOL TARTRATE 25 MG PO TABS: 12.5000 mg | ORAL_TABLET | Freq: Two times a day (BID) | ORAL | 1 refills | Status: DC

## 2017-12-09 ENCOUNTER — Encounter: Payer: Self-pay | Admitting: Internal Medicine

## 2017-12-09 ENCOUNTER — Other Ambulatory Visit: Payer: Self-pay | Admitting: Internal Medicine

## 2017-12-09 ENCOUNTER — Ambulatory Visit (INDEPENDENT_AMBULATORY_CARE_PROVIDER_SITE_OTHER): Payer: Medicare Other | Admitting: Internal Medicine

## 2017-12-09 VITALS — BP 114/68 | HR 90 | Temp 98.4°F | Ht 63.0 in | Wt 92.0 lb

## 2017-12-09 DIAGNOSIS — F4312 Post-traumatic stress disorder, chronic: Secondary | ICD-10-CM

## 2017-12-09 DIAGNOSIS — J439 Emphysema, unspecified: Secondary | ICD-10-CM | POA: Diagnosis not present

## 2017-12-09 DIAGNOSIS — R51 Headache: Secondary | ICD-10-CM

## 2017-12-09 DIAGNOSIS — R519 Headache, unspecified: Secondary | ICD-10-CM

## 2017-12-09 DIAGNOSIS — R6889 Other general symptoms and signs: Secondary | ICD-10-CM | POA: Diagnosis not present

## 2017-12-09 MED ORDER — BUTALBITAL-APAP-CAFFEINE 50-325-40 MG PO TABS: 1.0000 | ORAL_TABLET | Freq: Two times a day (BID) | ORAL | 0 refills | Status: DC | PRN

## 2017-12-09 MED ORDER — OSELTAMIVIR PHOSPHATE 75 MG PO CAPS: 75.0000 mg | ORAL_CAPSULE | Freq: Two times a day (BID) | ORAL | 0 refills | Status: DC

## 2017-12-09 MED ORDER — PREDNISONE 10 MG PO TABS: ORAL_TABLET | ORAL | 0 refills | Status: DC

## 2017-12-09 MED ORDER — ALPRAZOLAM 0.25 MG PO TABS: 0.2500 mg | ORAL_TABLET | Freq: Two times a day (BID) | ORAL | 1 refills | Status: DC | PRN

## 2017-12-09 NOTE — Assessment & Plan Note (Signed)
Mild, cont same tx, to f/u any worsening symptoms or concerns

## 2017-12-09 NOTE — Assessment & Plan Note (Signed)
With home o2, stable overall by history and exam, and pt to continue medical treatment as before,  to f/u any worsening symptoms or concerns

## 2017-12-09 NOTE — Assessment & Plan Note (Signed)
Mild to mod, for tamiflu course,  to f/u any worsening symptoms or concerns

## 2017-12-09 NOTE — Progress Notes (Signed)
Subjective:    Patient ID: Megan [name], female    DOB: [birth year], 67 y.o.   MRN: [id number]  HPI   Here with 2-3 days acute onset fever, facial pain, pressure, headache, general weakness and malaise, and clearish d/c, with mild ST and cough, but pt denies chest pain, wheezing, increased sob or doe, orthopnea, PND, increased LE swelling, palpitations, dizziness or syncope. Has 2 grandhildren in the home with proven inf A.  Does have some nausea and abd tenderness.  Also right shoulder very sore to touch, off and on for weeks before her acute complaints above.  No trauma or fall, fever or left shoulder pain. Does have 1 wks ongoing nasal allergy symptoms with clearish congestion, itch and sneezing, without fever, pain, ST, cough, swelling or wheezing.  Asks for med refill for recurrent chronic HA. Past Medical History:  Diagnosis Date  . Anemia   . Anxiety state 08/20/2015  . CAP (community acquired pneumonia)   . CHF (congestive heart failure) ([place])   . COPD (chronic obstructive pulmonary disease) ([place])   . Depression   . Essential hypertension 08/19/2015  . GERD (gastroesophageal reflux disease)   . Headache   . History of hiatal hernia   . Myocardial infarction ([place])   . Shortness of breath dyspnea    Past Surgical History:  Procedure Laterality Date  . CARDIAC CATHETERIZATION    . CARDIAC CATHETERIZATION N/A 08/26/2015   Procedure: Left Heart Cath and Coronary Angiography;  Surgeon: Jettie Booze, MD;  Location: [facility];  Service: Cardiovascular;  Laterality: N/A;    reports that she quit smoking about 17 years ago. Her smoking use included cigarettes. She has a 30.00 pack-year smoking history. she has never used smokeless tobacco. She reports that she does not drink alcohol or use drugs. family history includes Cirrhosis in her mother; Depression in her mother; Heart disease in her father. Allergies  Allergen Reactions  . Fentanyl And Related     Behavioral  changes per daughter  . Penicillins Rash    Has patient had a PCN reaction causing immediate rash, facial/tongue/throat swelling, SOB or lightheadedness with hypotension: No Has patient had a PCN reaction causing severe rash involving mucus membranes or skin necrosis:NO Has patient had a PCN reaction that required hospitalization No Has patient had a PCN reaction occurring within the last 10 years:NO If all of the above answers are "NO", then may proceed with Cephalosporin use.   Current Outpatient Medications on File Prior to Visit  Medication Sig Dispense Refill  . aspirin EC 81 MG tablet Take 81 mg by mouth daily.    . Butalbital-APAP-Caffeine 50-300-40 MG CAPS TAKE ONE CAPSULE BY MOUTH EVERY 6 HOURS AS NEEDED FOR HEADACHE 60 capsule 0  . diltiazem (CARDIZEM CD) 240 MG 24 hr capsule Take 1 capsule (240 mg total) by mouth daily. 30 capsule 0  . feeding supplement, ENSURE ENLIVE, (ENSURE ENLIVE) LIQD Take 237 mLs by mouth 4 (four) times daily. 237 mL 0  . fluticasone (FLONASE) 50 MCG/ACT nasal spray Place 2 sprays into both nostrils daily. 16 g 0  . INCRUSE ELLIPTA 62.5 MCG/INH AEPB INHALE 1 PUFF INTO THE LUNGS DAILY 1 each 1  . levalbuterol (XOPENEX HFA) 45 MCG/ACT inhaler INHALE 1 TO 2 PUFFS INTO THE LUNGS EVERY 4 HOURS AS NEEDED FOR WHEEZING 75 g 2  . levalbuterol (XOPENEX) 0.63 MG/3ML nebulizer solution Take 3 mLs (0.63 mg total) by nebulization every 4 (four) hours as needed for wheezing.  3 mL 12  . levalbuterol (XOPENEX) 1.25 MG/0.5ML nebulizer solution Take 1.25 mg by nebulization every 6 (six) hours. 1 each 12  . loratadine (CLARITIN) 10 MG tablet Take 1 tablet (10 mg total) daily by mouth. 90 tablet 3  . metoprolol tartrate (LOPRESSOR) 25 MG tablet Take 0.5 tablets (12.5 mg total) by mouth 2 (two) times daily. 90 tablet 1  . pantoprazole (PROTONIX) 40 MG tablet TAKE 1 TABLET BY MOUTH EVERY DAY FOR GASTRO-ESOPHAGEAL REFLUX DISEASE 90 tablet 0  . polyethylene glycol (MIRALAX / GLYCOLAX)  packet Take 17 g by mouth 2 (two) times daily. 14 each 0  . sertraline (ZOLOFT) 25 MG tablet TAKE 1 TABLET(25 MG) BY MOUTH DAILY 90 tablet 0  . tiZANidine (ZANAFLEX) 4 MG tablet TAKE 1 TABLET(4 MG) BY MOUTH EVERY 6 HOURS AS NEEDED FOR MUSCLE SPASMS 360 tablet 1  . XOPENEX HFA 45 MCG/ACT inhaler INHALE 1 TO 2 PUFFS INTO THE LUNGS EVERY 4 HOURS AS NEEDED FOR WHEEZING 15 g 0  . budesonide (PULMICORT) 0.5 MG/2ML nebulizer solution Take 2 mLs (0.5 mg total) by nebulization 2 (two) times daily. 120 mL 0   No current facility-administered medications on file prior to visit.    Review of Systems  Constitutional: Negative for other unusual diaphoresis or sweats HENT: Negative for ear discharge or swelling Eyes: Negative for other worsening visual disturbances Respiratory: Negative for stridor or other swelling  Gastrointestinal: Negative for worsening distension or other blood Genitourinary: Negative for retention or other urinary change Musculoskeletal: Negative for other MSK pain or swelling Skin: Negative for color change or other new lesions Neurological: Negative for worsening tremors and other numbness  Psychiatric/Behavioral: Negative for worsening agitation or other fatigue All other system neg per pt    Objective:   Physical Exam BP 114/68   Pulse 90   Temp 98.4 F (36.9 C) (Oral)   Ht 5\' 3"  (1.6 m)   Wt 92 lb (41.7 kg)   SpO2 91%   BMI 16.30 kg/m  VS noted, mild ill Constitutional: Pt appears in NAD HENT: Head: NCAT.  Right Ear: External ear normal.  Left Ear: External ear normal.  Eyes: . Pupils are equal, round, and reactive to light. Conjunctivae and EOM are normal Bilat tm's with mild erythema.  Max sinus areas mild tender.  Pharynx with mild erythema, no exudate Nose: without d/c or deformity Neck: Neck supple. Gross normal ROM Cardiovascular: Normal rate and regular rhythm.   Pulmonary/Chest: Effort normal and breath sounds without rales or wheezing.  Right shoulder  with marked AC joint tender, swelling and mild erythema Neurological: Pt is alert. At baseline orientation, motor grossly intact Skin: Skin is warm. No rashes, other new lesions, no LE edema Psychiatric: Pt behavior is normal without agitation , 1+ nervous No other exam findings    Assessment & Plan:

## 2017-12-09 NOTE — Patient Instructions (Addendum)
Please take all new medication as prescribed - the tamiflu, and prednisone  Please consider Sports Medicine in this office if your shoulder is not improved by Monday  Please continue all other medications as before, and refills have been done if requested - the xanax  Please have the pharmacy call with any other refills you may need.  Please keep your appointments with your specialists as you may have planned

## 2017-12-09 NOTE — Assessment & Plan Note (Signed)
Stable, for med refill,  to f/u any worsening symptoms or concerns

## 2017-12-12 ENCOUNTER — Telehealth: Payer: Self-pay | Admitting: Internal Medicine

## 2017-12-12 NOTE — Telephone Encounter (Signed)
She states they did talk about the pain in her shoulder. I did not see it, I saw in the comment of the appointment it said shoulder pain.   She states he is giving her prednisone for the pain and to call back if this did not work?

## 2017-12-12 NOTE — Telephone Encounter (Signed)
Patient was not seen for pain during her most recent OV. Called pt, LVM to discuss what type of pain she is experiencing. Depending on what type of medication she is requesting she will need another OV more than likely to come in to discuss.

## 2017-12-12 NOTE — Telephone Encounter (Signed)
I have sent to Dr. Jenny Reichmann for him to advise.

## 2017-12-12 NOTE — Telephone Encounter (Signed)
Copied from [place]. Topic: General - Other >> Dec 12, 2017 10:04 AM Carolyn Stare wrote:  Pt said should is hurting really bad and is asking if something can be called in for pain  [place] with patient, She was seen on 2/8 and wants something for pain. The medication is not working that was called on 2/8. Patient declined an appointment with Dr. Jenny Reichmann and with Sports med.

## 2017-12-12 NOTE — Telephone Encounter (Signed)
Appointment made for 2/14 with Dr. Raeford Razor.

## 2017-12-12 NOTE — Telephone Encounter (Signed)
Please ask pt to see Sports medicine asap, ok to cont the prednisone

## 2017-12-15 ENCOUNTER — Ambulatory Visit: Payer: Medicare Other | Admitting: Family Medicine

## 2017-12-20 ENCOUNTER — Ambulatory Visit: Payer: Medicare Other | Admitting: Family Medicine

## 2017-12-20 NOTE — Progress Notes (Deleted)
Megan [name] - 67 y.o. female MRN [id number]  Date of birth: May 30, 1951  SUBJECTIVE:  Including CC & ROS.  No chief complaint on file.   Megan [name] is a 67 y.o. female that is  ***.  ***   Review of Systems  HISTORY: Past Medical, Surgical, Social, and Family History Reviewed & Updated per EMR.   Pertinent Historical Findings include:  Past Medical History:  Diagnosis Date  . Anemia   . Anxiety state 08/20/2015  . CAP (community acquired pneumonia)   . CHF (congestive heart failure) ([place])   . COPD (chronic obstructive pulmonary disease) ([place])   . Depression   . Essential hypertension 08/19/2015  . GERD (gastroesophageal reflux disease)   . Headache   . History of hiatal hernia   . Myocardial infarction ([place])   . Shortness of breath dyspnea     Past Surgical History:  Procedure Laterality Date  . CARDIAC CATHETERIZATION    . CARDIAC CATHETERIZATION N/A 08/26/2015   Procedure: Left Heart Cath and Coronary Angiography;  Surgeon: Jettie Booze, MD;  Location: [facility];  Service: Cardiovascular;  Laterality: N/A;    Allergies  Allergen Reactions  . Fentanyl And Related     Behavioral changes per daughter  . Penicillins Rash    Has patient had a PCN reaction causing immediate rash, facial/tongue/throat swelling, SOB or lightheadedness with hypotension: No Has patient had a PCN reaction causing severe rash involving mucus membranes or skin necrosis:NO Has patient had a PCN reaction that required hospitalization No Has patient had a PCN reaction occurring within the last 10 years:NO If all of the above answers are "NO", then may proceed with Cephalosporin use.    Family History  Problem Relation Age of Onset  . Cirrhosis Mother   . Depression Mother   . Heart disease Father      Social History   Socioeconomic History  . Marital status: Widowed    Spouse name: Not on file  . Number of children: 3  . Years of education: 38  . Highest education  level: Not on file  Social Needs  . Financial resource strain: Not on file  . Food insecurity - worry: Not on file  . Food insecurity - inability: Not on file  . Transportation needs - medical: Not on file  . Transportation needs - non-medical: Not on file  Occupational History  . Occupation: Disability  Tobacco Use  . Smoking status: Former Smoker    Packs/day: 1.00    Years: 30.00    Pack years: 30.00    Types: Cigarettes    Last attempt to quit: 08/18/2000    Years since quitting: 17.3  . Smokeless tobacco: Never Used  Substance and Sexual Activity  . Alcohol use: No  . Drug use: No  . Sexual activity: No  Other Topics Concern  . Not on file  Social History Narrative   Denies abuse and feels safe at home.      PHYSICAL EXAM:  VS: There were no vitals taken for this visit. Physical Exam Gen: NAD, alert, cooperative with exam, well-appearing ENT: normal lips, normal nasal mucosa,  Eye: normal EOM, normal conjunctiva and lids CV:  no edema, +2 pedal pulses   Resp: no accessory muscle use, non-labored,  GI: no masses or tenderness, no hernia  Skin: no rashes, no areas of induration  Neuro: normal tone, normal sensation to touch Psych:  normal insight, alert and oriented MSK:  ***  ASSESSMENT & PLAN:   No problem-specific Assessment & Plan notes found for this encounter.

## 2018-01-04 ENCOUNTER — Other Ambulatory Visit (INDEPENDENT_AMBULATORY_CARE_PROVIDER_SITE_OTHER): Payer: Medicare Other

## 2018-01-04 ENCOUNTER — Ambulatory Visit (INDEPENDENT_AMBULATORY_CARE_PROVIDER_SITE_OTHER): Payer: Medicare Other | Admitting: Family

## 2018-01-04 ENCOUNTER — Encounter: Payer: Self-pay | Admitting: Family

## 2018-01-04 VITALS — BP 112/72 | HR 79 | Temp 98.5°F | Ht 63.0 in | Wt 95.1 lb

## 2018-01-04 DIAGNOSIS — R42 Dizziness and giddiness: Secondary | ICD-10-CM

## 2018-01-04 DIAGNOSIS — R6883 Chills (without fever): Secondary | ICD-10-CM

## 2018-01-04 DIAGNOSIS — R202 Paresthesia of skin: Secondary | ICD-10-CM | POA: Diagnosis not present

## 2018-01-04 DIAGNOSIS — M25511 Pain in right shoulder: Secondary | ICD-10-CM

## 2018-01-04 DIAGNOSIS — R7309 Other abnormal glucose: Secondary | ICD-10-CM

## 2018-01-04 DIAGNOSIS — R200 Anesthesia of skin: Secondary | ICD-10-CM

## 2018-01-04 DIAGNOSIS — R899 Unspecified abnormal finding in specimens from other organs, systems and tissues: Secondary | ICD-10-CM

## 2018-01-04 LAB — COMPREHENSIVE METABOLIC PANEL
ALT: 12 U/L (ref 0–35)
AST: 14 U/L (ref 0–37)
Albumin: 4.1 g/dL (ref 3.5–5.2)
Alkaline Phosphatase: 66 U/L (ref 39–117)
BILIRUBIN TOTAL: 0.3 mg/dL (ref 0.2–1.2)
BUN: 13 mg/dL (ref 6–23)
CO2: 38 meq/L — AB (ref 19–32)
CREATININE: 0.85 mg/dL (ref 0.40–1.20)
Calcium: 9.4 mg/dL (ref 8.4–10.5)
Chloride: 95 mEq/L — ABNORMAL LOW (ref 96–112)
GFR: 71.36 mL/min (ref >60.00)
GLUCOSE: 139 mg/dL — AB (ref 70–99)
Potassium: 4 mEq/L (ref 3.5–5.1)
SODIUM: 139 meq/L (ref 135–145)
Total Protein: 6.8 g/dL (ref 6.0–8.3)

## 2018-01-04 LAB — URINALYSIS
Bilirubin Urine: NEGATIVE
KETONES UR: NEGATIVE
LEUKOCYTES UA: NEGATIVE
Nitrite: NEGATIVE
PH: 6 (ref 5.0–8.0)
SPECIFIC GRAVITY, URINE: 1.01 (ref 1.000–1.030)
Total Protein, Urine: NEGATIVE
URINE GLUCOSE: NEGATIVE
Urobilinogen, UA: 0.2 (ref 0.0–1.0)

## 2018-01-04 LAB — CBC WITH DIFFERENTIAL/PLATELET
BASOS ABS: 0 10*3/uL (ref 0.0–0.1)
BASOS PCT: 0.3 % (ref 0.0–3.0)
EOS ABS: 0.1 10*3/uL (ref 0.0–0.7)
Eosinophils Relative: 0.7 % (ref 0.0–5.0)
HEMATOCRIT: 35.8 % — AB (ref 36.0–46.0)
Hemoglobin: 11.7 g/dL — ABNORMAL LOW (ref 12.0–15.0)
LYMPHS PCT: 26 % (ref 12.0–46.0)
Lymphs Abs: 2.1 10*3/uL (ref 0.7–4.0)
MCHC: 32.8 g/dL (ref 30.0–36.0)
MCV: 94.6 fl (ref 78.0–100.0)
Monocytes Absolute: 1.1 10*3/uL — ABNORMAL HIGH (ref 0.1–1.0)
Monocytes Relative: 13.1 % — ABNORMAL HIGH (ref 3.0–12.0)
NEUTROS ABS: 4.9 10*3/uL (ref 1.4–7.7)
NEUTROS PCT: 59.9 % (ref 43.0–77.0)
PLATELETS: 338 10*3/uL (ref 150.0–400.0)
RBC: 3.78 Mil/uL — ABNORMAL LOW (ref 3.87–5.11)
RDW: 13.4 % (ref 11.5–15.5)
WBC: 8.2 10*3/uL (ref 4.0–10.5)

## 2018-01-04 LAB — VITAMIN B12: VITAMIN B 12: 521 pg/mL (ref 211–911)

## 2018-01-04 LAB — MAGNESIUM: Magnesium: 2 mg/dL (ref 1.5–2.5)

## 2018-01-04 LAB — TSH: TSH: 1.37 u[IU]/mL (ref 0.35–4.50)

## 2018-01-04 MED ORDER — FLUTICASONE PROPIONATE 50 MCG/ACT NA SUSP: 2.0000 | Freq: Every day | NASAL | 6 refills | Status: DC

## 2018-01-04 NOTE — Progress Notes (Signed)
Megan [name] is a 67 y.o. female with the following history as recorded in EpicCare:  Patient Active Problem List   Diagnosis Date Noted  . Flu-like symptoms 12/09/2017  . Fibromyalgia 09/16/2017  . Fatigue 09/16/2017  . Acute on chronic respiratory failure with hypercapnia ([place])   . Tracheostomy dependence ([place])   . Tracheostomy, acute management ([place])   . Acute kidney injury ([place])   . Respiratory acidosis   . Sepsis ([place])   . Hyperglycemia 01/05/2017  . Tachycardia   . Acute respiratory failure with hypercapnia ([place]) 01/04/2017  . SOB (shortness of breath)   . Acute upper respiratory infection 12/22/2016  . Hair loss 06/22/2016  . Rash 06/22/2016  . Pre-operative clearance 05/19/2016  . Mixed headache 03/19/2016  . COPD (chronic obstructive pulmonary disease) ([place]) 03/19/2016  . CAP (community acquired pneumonia)   . Acute respiratory failure ([place]) 02/12/2016  . Esophageal reflux 09/01/2015  . Goals of care, counseling/discussion   . Palliative care encounter   . Acute exacerbation of chronic obstructive pulmonary disease (COPD) ([place]) 08/26/2015  . Anxiety 08/20/2015  . Chronic post-traumatic stress disorder (PTSD) 08/20/2015  . Multiple pulmonary nodules determined by computed tomography of lung 08/19/2015  . Essential hypertension 08/19/2015  . COPD exacerbation ([place]) 08/19/2015  . Protein-calorie malnutrition, severe 08/19/2015    Current Outpatient Medications  Medication Sig Dispense Refill  . ADVAIR DISKUS 500-50 MCG/DOSE AEPB TAKE 1 INHALATION BY MOUTH TWICE DAILY FOR COPD**RINSE MOUTH WITH WATER AFTER USE** DO NOT SWALLOW 3 each 3  . ALPRAZolam (XANAX) 0.25 MG tablet Take 1 tablet (0.25 mg total) by mouth 2 (two) times daily as needed. for anxiety 60 tablet 1  . aspirin EC 81 MG tablet Take 81 mg by mouth daily.    . butalbital-acetaminophen-caffeine (FIORICET, ESGIC) 50-325-40 MG tablet Take 1 tablet by mouth 2 (two) times daily as needed for headache. 30 tablet 0   . Butalbital-APAP-Caffeine 50-300-40 MG CAPS TAKE ONE CAPSULE BY MOUTH EVERY 6 HOURS AS NEEDED FOR HEADACHE 60 capsule 0  . diltiazem (CARDIZEM CD) 240 MG 24 hr capsule Take 1 capsule (240 mg total) by mouth daily. 30 capsule 0  . diltiazem (TIAZAC) 240 MG 24 hr capsule TK ONE C PO ONCE D  0  . feeding supplement, ENSURE ENLIVE, (ENSURE ENLIVE) LIQD Take 237 mLs by mouth 4 (four) times daily. 237 mL 0  . fluticasone (FLONASE) 50 MCG/ACT nasal spray Place 2 sprays into both nostrils daily. 16 g 0  . INCRUSE ELLIPTA 62.5 MCG/INH AEPB INHALE 1 PUFF INTO THE LUNGS DAILY 1 each 1  . levalbuterol (XOPENEX HFA) 45 MCG/ACT inhaler INHALE 1 TO 2 PUFFS INTO THE LUNGS EVERY 4 HOURS AS NEEDED FOR WHEEZING 75 g 2  . levalbuterol (XOPENEX) 0.63 MG/3ML nebulizer solution Take 3 mLs (0.63 mg total) by nebulization every 4 (four) hours as needed for wheezing. 3 mL 12  . levalbuterol (XOPENEX) 1.25 MG/0.5ML nebulizer solution Take 1.25 mg by nebulization every 6 (six) hours. 1 each 12  . loratadine (CLARITIN) 10 MG tablet Take 1 tablet (10 mg total) daily by mouth. 90 tablet 3  . metoprolol tartrate (LOPRESSOR) 25 MG tablet Take 0.5 tablets (12.5 mg total) by mouth 2 (two) times daily. 90 tablet 1  . pantoprazole (PROTONIX) 40 MG tablet TAKE 1 TABLET BY MOUTH EVERY DAY FOR GASTRO-ESOPHAGEAL REFLUX DISEASE 90 tablet 0  . polyethylene glycol (MIRALAX / GLYCOLAX) packet Take 17 g by mouth 2 (two) times daily. 14 each 0  .  sertraline (ZOLOFT) 25 MG tablet TAKE 1 TABLET(25 MG) BY MOUTH DAILY 90 tablet 0  . tiZANidine (ZANAFLEX) 4 MG tablet TAKE 1 TABLET(4 MG) BY MOUTH EVERY 6 HOURS AS NEEDED FOR MUSCLE SPASMS 360 tablet 1  . XOPENEX HFA 45 MCG/ACT inhaler INHALE 1 TO 2 PUFFS INTO THE LUNGS EVERY 4 HOURS AS NEEDED FOR WHEEZING 15 g 0  . budesonide (PULMICORT) 0.5 MG/2ML nebulizer solution Take 2 mLs (0.5 mg total) by nebulization 2 (two) times daily. 120 mL 0  . fluticasone (FLONASE) 50 MCG/ACT nasal spray Place 2 sprays  into both nostrils daily. 16 g 6   No current facility-administered medications for this visit.     Allergies: Fentanyl and related and Penicillins  Past Medical History:  Diagnosis Date  . Anemia   . Anxiety state 08/20/2015  . CAP (community acquired pneumonia)   . CHF (congestive heart failure) ([place])   . COPD (chronic obstructive pulmonary disease) ([place])   . Depression   . Essential hypertension 08/19/2015  . GERD (gastroesophageal reflux disease)   . Headache   . History of hiatal hernia   . Myocardial infarction ([place])   . Shortness of breath dyspnea     Past Surgical History:  Procedure Laterality Date  . CARDIAC CATHETERIZATION    . CARDIAC CATHETERIZATION N/A 08/26/2015   Procedure: Left Heart Cath and Coronary Angiography;  Surgeon: Jettie Booze, MD;  Location: [facility];  Service: Cardiovascular;  Laterality: N/A;    Family History  Problem Relation Age of Onset  . Cirrhosis Mother   . Depression Mother   . Heart disease Father     Social History   Tobacco Use  . Smoking status: Former Smoker    Packs/day: 1.00    Years: 30.00    Pack years: 30.00    Types: Cigarettes    Last attempt to quit: 08/18/2000    Years since quitting: 17.3  . Smokeless tobacco: Never Used  Substance Use Topics  . Alcohol use: No    Subjective:  Patient presents with concerns for dizziness x 3 days; denies any sensation that room is spinning around her; notes that in general, she feels "off balance" and has sensation of "numbness" over her body; notes in general, "she just doesn't feel well." Has been having some chills; does have increased urinary frequency in the past week but denies any burning on urination;   Objective:  Vitals:   01/04/18 1123  BP: 112/72  Pulse: 79  Temp: 98.5 F (36.9 C)  TempSrc: Oral  SpO2: 99%  Weight: 95 lb 1.3 oz (43.1 kg)  Height: '5\' 3"'$  (1.6 m)    General: Well developed, well nourished, in no acute distress  Skin : Warm and  dry.  Head: Normocephalic and atraumatic  Eyes: Sclera and conjunctiva clear; pupils round and reactive to light; extraocular movements intact  Ears: External normal; canals clear; tympanic membranes congested bilaterally Oropharynx: Pink, supple. No suspicious lesions  Neck: Supple without thyromegaly, adenopathy  Lungs: Respirations unlabored; clear to auscultation bilaterally without wheeze, rales, rhonchi  CVS exam: normal rate and regular rhythm.  Neurologic: Alert and oriented; speech intact; face symmetrical; moves all extremities well; CNII-XII intact without focal deficit  Assessment:  1. Dizziness   2. Numbness and tingling   3. Chills (without fever)   4. Acute pain of right shoulder     Plan:  1. Suspect inner ear source; refill on Flonase 2 sprays per nostril qd; will check  CBC, CMP today; 2. Check B12 and TSH today; 3. Update U/A and urine culture; 4. Encouraged to follow-up with sports medicine as has been previously recommended; she can make her own appt with Dr. Tamala Julian or Raeford Razor;  No Follow-up on file.  Orders Placed This Encounter  Procedures  . Urine Culture    Standing Status:   Future    Standing Expiration Date:   01/04/2019  . CBC w/Diff    Standing Status:   Future    Number of Occurrences:   1    Standing Expiration Date:   01/04/2019  . Comp Met (CMET)    Standing Status:   Future    Number of Occurrences:   1    Standing Expiration Date:   01/04/2019  . TSH    Standing Status:   Future    Number of Occurrences:   1    Standing Expiration Date:   01/04/2019  . Magnesium    Standing Status:   Future    Number of Occurrences:   1    Standing Expiration Date:   01/04/2019  . B12    Standing Status:   Future    Number of Occurrences:   1    Standing Expiration Date:   01/04/2019  . Urinalysis    Standing Status:   Future    Standing Expiration Date:   01/04/2019  . Ambulatory referral to Sports Medicine    Referral Priority:   Routine    Referral Type:    Consultation    Number of Visits Requested:   1    Requested Prescriptions   Signed Prescriptions Disp Refills  . fluticasone (FLONASE) 50 MCG/ACT nasal spray 16 g 6    Sig: Place 2 sprays into both nostrils daily.

## 2018-01-05 ENCOUNTER — Encounter: Payer: Self-pay | Admitting: Family Medicine

## 2018-01-05 ENCOUNTER — Ambulatory Visit (INDEPENDENT_AMBULATORY_CARE_PROVIDER_SITE_OTHER): Payer: Medicare Other | Admitting: Family Medicine

## 2018-01-05 ENCOUNTER — Ambulatory Visit: Payer: Self-pay

## 2018-01-05 VITALS — BP 102/58 | HR 58 | Ht 63.0 in | Wt 93.0 lb

## 2018-01-05 DIAGNOSIS — M25511 Pain in right shoulder: Secondary | ICD-10-CM

## 2018-01-05 LAB — URINE CULTURE
MICRO NUMBER: 90288061
SPECIMEN QUALITY:: ADEQUATE

## 2018-01-05 NOTE — Progress Notes (Signed)
Megan [name] - 67 y.o. female MRN [id number]  Date of birth: 1951/10/25  SUBJECTIVE:  Including CC & ROS.  Chief Complaint  Patient presents with  . Right shoulder pain    Megan [name] is a 67 y.o. female that is presenting with right shoulder pain. Pain is acute in nature. Pain is described as a sharp stabbing pain.Located in the anterior portion and radiates down to her elbow. Denies injury or prior surgeries. Admits to decrease range of motion. She has been taking tylenol with no improvement. Pain hasn't improved with home modalities. Pain is worse with having to lift her oxygen tank.   Independent review of chest x-ray from 03/18/17 shows no joint disease of the right shoulder.   Review of Systems  Constitutional: Negative for fever.  HENT: Negative for congestion.   Eyes: Negative for visual disturbance.  Respiratory: Positive for shortness of breath.   Cardiovascular: Negative for chest pain.  Gastrointestinal: Negative for abdominal pain.  Musculoskeletal: Positive for myalgias.  Skin: Negative for color change.  Neurological: Negative for weakness.  Hematological: Negative for adenopathy.  Psychiatric/Behavioral: Negative for agitation.    HISTORY: Past Medical, Surgical, Social, and Family History Reviewed & Updated per EMR.   Pertinent Historical Findings include:  Past Medical History:  Diagnosis Date  . Anemia   . Anxiety state 08/20/2015  . CAP (community acquired pneumonia)   . CHF (congestive heart failure) ([employer])   . COPD (chronic obstructive pulmonary disease) ([place])   . Depression   . Essential hypertension 08/19/2015  . GERD (gastroesophageal reflux disease)   . Headache   . History of hiatal hernia   . Myocardial infarction ([place])   . Shortness of breath dyspnea     Past Surgical History:  Procedure Laterality Date  . CARDIAC CATHETERIZATION    . CARDIAC CATHETERIZATION N/A 08/26/2015   Procedure: Left Heart Cath and Coronary Angiography;  Surgeon:  Jettie Booze, MD;  Location: [facility];  Service: Cardiovascular;  Laterality: N/A;    Allergies  Allergen Reactions  . Fentanyl And Related     Behavioral changes per daughter  . Penicillins Rash    Has patient had a PCN reaction causing immediate rash, facial/tongue/throat swelling, SOB or lightheadedness with hypotension: No Has patient had a PCN reaction causing severe rash involving mucus membranes or skin necrosis:NO Has patient had a PCN reaction that required hospitalization No Has patient had a PCN reaction occurring within the last 10 years:NO If all of the above answers are "NO", then may proceed with Cephalosporin use.    Family History  Problem Relation Age of Onset  . Cirrhosis Mother   . Depression Mother   . Heart disease Father      Social History   Socioeconomic History  . Marital status: Widowed    Spouse name: Not on file  . Number of children: 3  . Years of education: 57  . Highest education level: Not on file  Social Needs  . Financial resource strain: Not on file  . Food insecurity - worry: Not on file  . Food insecurity - inability: Not on file  . Transportation needs - medical: Not on file  . Transportation needs - non-medical: Not on file  Occupational History  . Occupation: Disability  Tobacco Use  . Smoking status: Former Smoker    Packs/day: 1.00    Years: 30.00    Pack years: 30.00    Types: Cigarettes    Last attempt to quit:  08/18/2000    Years since quitting: 17.4  . Smokeless tobacco: Never Used  Substance and Sexual Activity  . Alcohol use: No  . Drug use: No  . Sexual activity: No  Other Topics Concern  . Not on file  Social History Narrative   Denies abuse and feels safe at home.      PHYSICAL EXAM:  VS: BP (!) 102/58 (BP Location: Left Arm, Patient Position: Sitting, Cuff Size: Normal)   Pulse (!) 58   Ht 5\' 3"  (1.6 m)   Wt 93 lb (42.2 kg)   SpO2 98%   BMI 16.47 kg/m  Physical Exam Gen: NAD,  alert, cooperative with exam, Nasal cannula in place  ENT: normal lips, normal nasal mucosa,  Eye: normal EOM, normal conjunctiva and lids CV:  no edema, +2 pedal pulses   Resp: no accessory muscle use, non-labored,  Skin: no rashes, no areas of induration  Neuro: normal tone, normal sensation to touch Psych:  normal insight, alert and oriented MSK:  Right shoulder:  Normal active flexion and abduction. Pain with external rotation and limited compared to left. More pain with external rotation and abduction. Mild pain with empty can testing and Hawkins testing. Neurovascular intact  Limited ultrasound: Right shoulder:  normal-appearing biceps tendon and short axis. Supraspinatus with hypoechoic changes to suggest a tendinopathy. No joint disease of the posterior glenohumeral joint:   Summary: Supraspinatus tendinopathy  Ultrasound and interpretation by Clearance Coots, MD   Aspiration/Injection Procedure Note Megan [name] 03-17-1951  Procedure: Injection Indications: Right shoulder pain  Procedure Details Consent: Risks of procedure as well as the alternatives and risks of each were explained to the (patient/caregiver).  Consent for procedure obtained. Time Out: Verified patient identification, verified procedure, site/side was marked, verified correct patient position, special equipment/implants available, medications/allergies/relevent history reviewed, required imaging and test results available.  Performed.  The area was cleaned with iodine and alcohol swabs.    The right glenohumeral joint was injected using 1 cc's of 40 mg/mL Kenalog and 0.5% 4 cc Marcaine with a 25 1 1/2" needle.  Ultrasound was used. Images were obtained in Transverse views showing the injection.    A sterile dressing was applied.  Patient did tolerate procedure well.    ASSESSMENT & PLAN:   Acute pain of right shoulder Appears her pain is more consistent with a capsulitis. She does have  tendinopathy of the supraspinatus. - Glenohumeral injection - Counseled on home exercise therapy - If no improvement to consider subacromial injection versus PT and/or imaging

## 2018-01-05 NOTE — Patient Instructions (Signed)
Please try the exercises I have provided.  Please try ice the area  Please follow up with me in 3-4 weeks if you pain hasn't improved.

## 2018-01-07 DIAGNOSIS — M25511 Pain in right shoulder: Secondary | ICD-10-CM | POA: Insufficient documentation

## 2018-01-07 NOTE — Assessment & Plan Note (Signed)
Appears her pain is more consistent with a capsulitis. She does have tendinopathy of the supraspinatus. - Glenohumeral injection - Counseled on home exercise therapy - If no improvement to consider subacromial injection versus PT and/or imaging

## 2018-01-09 ENCOUNTER — Telehealth: Payer: Self-pay

## 2018-01-09 ENCOUNTER — Other Ambulatory Visit: Payer: Self-pay | Admitting: Acute Care

## 2018-01-09 NOTE — Telephone Encounter (Signed)
Jonelle Sidle spoke with patient this morning and info given. Copied from [place] [phone]. Topic: Inquiry >> Jan 09, 2018  2:12 PM Arletha Grippe wrote: Reason for CRM: [phone] Pt would like to have call back when labs are back  Please call

## 2018-01-19 ENCOUNTER — Other Ambulatory Visit (INDEPENDENT_AMBULATORY_CARE_PROVIDER_SITE_OTHER): Payer: Medicare Other

## 2018-01-19 ENCOUNTER — Ambulatory Visit (INDEPENDENT_AMBULATORY_CARE_PROVIDER_SITE_OTHER): Payer: Medicare Other | Admitting: Family Medicine

## 2018-01-19 ENCOUNTER — Encounter: Payer: Self-pay | Admitting: Family Medicine

## 2018-01-19 VITALS — BP 138/84 | HR 105 | Temp 99.0°F | Ht 63.0 in | Wt 93.0 lb

## 2018-01-19 DIAGNOSIS — R0602 Shortness of breath: Secondary | ICD-10-CM | POA: Diagnosis not present

## 2018-01-19 DIAGNOSIS — J441 Chronic obstructive pulmonary disease with (acute) exacerbation: Secondary | ICD-10-CM | POA: Diagnosis not present

## 2018-01-19 DIAGNOSIS — R7309 Other abnormal glucose: Secondary | ICD-10-CM

## 2018-01-19 DIAGNOSIS — R899 Unspecified abnormal finding in specimens from other organs, systems and tissues: Secondary | ICD-10-CM

## 2018-01-19 LAB — HEMOGLOBIN A1C: Hgb A1c MFr Bld: 5.5 % (ref 4.6–6.5)

## 2018-01-19 LAB — BASIC METABOLIC PANEL
BUN: 9 mg/dL (ref 6–23)
CALCIUM: 9.5 mg/dL (ref 8.4–10.5)
CO2: 33 meq/L — AB (ref 19–32)
Chloride: 98 mEq/L (ref 96–112)
Creatinine, Ser: 0.85 mg/dL (ref 0.40–1.20)
GFR: 71.35 mL/min (ref >60.00)
GLUCOSE: 160 mg/dL — AB (ref 70–99)
Potassium: 3.9 mEq/L (ref 3.5–5.1)
SODIUM: 142 meq/L (ref 135–145)

## 2018-01-19 MED ORDER — METHYLPREDNISOLONE ACETATE 40 MG/ML IJ SUSP
40.0000 mg | Freq: Once | INTRAMUSCULAR | Status: AC
  Administered 2018-01-19: 40 mg via INTRAMUSCULAR

## 2018-01-19 MED ORDER — DOXYCYCLINE HYCLATE 100 MG PO TABS: 100.0000 mg | ORAL_TABLET | Freq: Two times a day (BID) | ORAL | 0 refills | Status: DC

## 2018-01-19 MED ORDER — PREDNISONE 50 MG PO TABS: ORAL_TABLET | ORAL | 0 refills | Status: DC

## 2018-01-19 MED ORDER — IPRATROPIUM-ALBUTEROL 0.5-2.5 (3) MG/3ML IN SOLN
3.0000 mL | Freq: Once | RESPIRATORY_TRACT | Status: AC
  Administered 2018-01-19: 3 mL via RESPIRATORY_TRACT

## 2018-01-19 NOTE — Assessment & Plan Note (Addendum)
Symptoms most consistent with a COPD exacerbation.  Significant improvement with wheezing after DuoNeb treatment. - DuoNeb and IM depo  - prednisone and doxycycline - Given indication to seek immediate care.

## 2018-01-19 NOTE — Patient Instructions (Signed)
Please take the nebulizer every 4 hours for the next day.  Please seek immediate care if you symptoms do not improve

## 2018-01-19 NOTE — Progress Notes (Signed)
Winta Barcelo - 67 y.o. female MRN [id number]  Date of birth: 1951/03/27  SUBJECTIVE:  Including CC & ROS.  Chief Complaint  Patient presents with  . Bronchitis    Twilla Khouri is a 67 y.o. female that is presenting with wheezing. Ongoing for three days. Admits to productive cough and coughing up mucous-green in color. Denies fevers. Admits to shortness of breath. Admits to dizziness. She has not been taking anything for her symptoms. Feels like she is getting worse   Review of chest x-ray from 07/07/17 shows hyperexpansion of the lungs consistent with COPD.   Review of Systems  Constitutional: Negative for fever.  HENT: Negative for congestion.   Respiratory: Positive for cough and shortness of breath.   Cardiovascular: Negative for chest pain.  Gastrointestinal: Negative for abdominal pain.  Musculoskeletal: Negative for back pain.  Skin: Negative for color change.  Neurological: Negative for weakness.  Hematological: Negative for adenopathy.  Psychiatric/Behavioral: Negative for agitation.    HISTORY: Past Medical, Surgical, Social, and Family History Reviewed & Updated per EMR.   Pertinent Historical Findings include:  Past Medical History:  Diagnosis Date  . Anemia   . Anxiety state 08/20/2015  . CAP (community acquired pneumonia)   . CHF (congestive heart failure) ([place])   . COPD (chronic obstructive pulmonary disease) ([place])   . Depression   . Essential hypertension 08/19/2015  . GERD (gastroesophageal reflux disease)   . Headache   . History of hiatal hernia   . Myocardial infarction ([place])   . Shortness of breath dyspnea     Past Surgical History:  Procedure Laterality Date  . CARDIAC CATHETERIZATION    . CARDIAC CATHETERIZATION N/A 08/26/2015   Procedure: Left Heart Cath and Coronary Angiography;  Surgeon: Jettie Booze, MD;  Location: [facility];  Service: Cardiovascular;  Laterality: N/A;    Allergies  Allergen Reactions  . Fentanyl And Related      Behavioral changes per daughter  . Penicillins Rash    Has patient had a PCN reaction causing immediate rash, facial/tongue/throat swelling, SOB or lightheadedness with hypotension: No Has patient had a PCN reaction causing severe rash involving mucus membranes or skin necrosis:NO Has patient had a PCN reaction that required hospitalization No Has patient had a PCN reaction occurring within the last 10 years:NO If all of the above answers are "NO", then may proceed with Cephalosporin use.    Family History  Problem Relation Age of Onset  . Cirrhosis Mother   . Depression Mother   . Heart disease Father      Social History   Socioeconomic History  . Marital status: Widowed    Spouse name: Not on file  . Number of children: 3  . Years of education: 13  . Highest education level: Not on file  Occupational History  . Occupation: Disability  Social Needs  . Financial resource strain: Not on file  . Food insecurity:    Worry: Not on file    Inability: Not on file  . Transportation needs:    Medical: Not on file    Non-medical: Not on file  Tobacco Use  . Smoking status: Former Smoker    Packs/day: 1.00    Years: 30.00    Pack years: 30.00    Types: Cigarettes    Last attempt to quit: 08/18/2000    Years since quitting: 17.4  . Smokeless tobacco: Never Used  Substance and Sexual Activity  . Alcohol use: No  . Drug  use: No  . Sexual activity: Never  Lifestyle  . Physical activity:    Days per week: Not on file    Minutes per session: Not on file  . Stress: Not on file  Relationships  . Social connections:    Talks on phone: Not on file    Gets together: Not on file    Attends religious service: Not on file    Active member of club or organization: Not on file    Attends meetings of clubs or organizations: Not on file    Relationship status: Not on file  . Intimate partner violence:    Fear of current or ex partner: Not on file    Emotionally abused: Not on  file    Physically abused: Not on file    Forced sexual activity: Not on file  Other Topics Concern  . Not on file  Social History Narrative   Denies abuse and feels safe at home.      PHYSICAL EXAM:  VS: BP 138/84 (BP Location: Left Arm, Patient Position: Sitting, Cuff Size: Normal)   Pulse (!) 105   Temp 99 F (37.2 C) (Oral)   Ht 5\' 3"  (1.6 m)   Wt 93 lb (42.2 kg)   SpO2 95%   BMI 16.47 kg/m  Physical Exam Gen: NAD, alert, cooperative with exam, [place] in place  ENT: normal lips, normal nasal mucosa,  Eye: normal EOM, normal conjunctiva and lids CV:  no edema, +2 pedal pulses, regular rate and rhythm, S1-S2   Resp: no accessory muscle use, non-labored, clear to auscultation bilaterally, significant end expiratory wheezing.  Skin: no rashes, no areas of induration  Neuro: normal tone, normal sensation to touch Psych:  normal insight, alert and oriented MSK: Normal gait, normal strength       ASSESSMENT & PLAN:   COPD exacerbation ([place]) Symptoms most consistent with a COPD exacerbation.  Significant improvement with wheezing after DuoNeb treatment. - DuoNeb and IM depo  - prednisone and doxycycline - Given indication to seek immediate care.

## 2018-01-22 ENCOUNTER — Other Ambulatory Visit: Payer: Self-pay | Admitting: Acute Care

## 2018-02-07 ENCOUNTER — Encounter: Payer: Self-pay | Admitting: Pulmonary Disease

## 2018-02-07 ENCOUNTER — Other Ambulatory Visit: Payer: Self-pay | Admitting: Internal Medicine

## 2018-02-07 ENCOUNTER — Other Ambulatory Visit: Payer: Medicare Other

## 2018-02-07 ENCOUNTER — Ambulatory Visit (INDEPENDENT_AMBULATORY_CARE_PROVIDER_SITE_OTHER)
Admission: RE | Admit: 2018-02-07 | Discharge: 2018-02-07 | Disposition: A | Discharge status: Home / Self Care | Payer: Medicare Other | Source: Ambulatory Visit | Attending: Pulmonary Disease | Admitting: Pulmonary Disease

## 2018-02-07 ENCOUNTER — Ambulatory Visit (INDEPENDENT_AMBULATORY_CARE_PROVIDER_SITE_OTHER): Payer: Medicare Other | Admitting: Pulmonary Disease

## 2018-02-07 VITALS — BP 110/70 | HR 69 | Temp 98.2°F | Ht 63.0 in | Wt 91.6 lb

## 2018-02-07 DIAGNOSIS — F4312 Post-traumatic stress disorder, chronic: Secondary | ICD-10-CM

## 2018-02-07 DIAGNOSIS — J441 Chronic obstructive pulmonary disease with (acute) exacerbation: Secondary | ICD-10-CM

## 2018-02-07 DIAGNOSIS — J9611 Chronic respiratory failure with hypoxia: Secondary | ICD-10-CM

## 2018-02-07 DIAGNOSIS — J9612 Chronic respiratory failure with hypercapnia: Secondary | ICD-10-CM

## 2018-02-07 DIAGNOSIS — R05 Cough: Secondary | ICD-10-CM | POA: Diagnosis not present

## 2018-02-07 MED ORDER — DOXYCYCLINE HYCLATE 100 MG PO TABS: 100.0000 mg | ORAL_TABLET | Freq: Two times a day (BID) | ORAL | 0 refills | Status: DC

## 2018-02-07 MED ORDER — PREDNISONE 10 MG PO TABS: ORAL_TABLET | ORAL | 0 refills | Status: DC

## 2018-02-07 MED ORDER — ALPRAZOLAM 0.25 MG PO TABS: 0.2500 mg | ORAL_TABLET | Freq: Two times a day (BID) | ORAL | 1 refills | Status: DC | PRN

## 2018-02-07 NOTE — Assessment & Plan Note (Signed)
We will treat her as a COPD exacerbation with a longer course of prednisone this time.  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 #40   Doxy 100 mg twice daily x 7 days  CXR today

## 2018-02-07 NOTE — Progress Notes (Signed)
   Subjective:    Patient ID: Megan [name], female    DOB: Oct 01, 1951, 67 y.o.   MRN: [id number]  HPI  67 y.o. female former  Smoker quit 2001, with severe anxiety, COPD, on home oxygen 2L/m. She is followed by Dr. Lamonte Sakai. Last seen 07/2017   Chief Complaint  Patient presents with  . Acute Visit    Chills for 2 months. Lung congestion. Has been following up with Dr. Jenny Reichmann downstairs.    She presents for an acute visit today, reports chills for last 2 months, overall low energy, she is using her oxygen on a daily basis.  Lungs feel overinflated and reports chest tightness.  Cough productive of initially yellow and now mucoid sputum. Saw her PCP and was given prednisone 50 mg for 5 days and doxycycline which made her feel better but now symptoms have returned  I also note an episode of acute respiratory failure due to pneumonia in 01/2017 requiring tracheostomy and respiratory culture at that time showing MRSA  She is maintained on a regimen of Advair and incruse, uses Xopenex for rescue has not really used her nebs much  Did not see any old spirometry documented. CT angiogram 12/2016 was reviewed and this shows severe centrilobular emphysema and marked hyperinflation with patchy consolidation in the right lung base  Past Medical History:  Diagnosis Date  . Anemia   . Anxiety state 08/20/2015  . CAP (community acquired pneumonia)   . CHF (congestive heart failure) ([place])   . COPD (chronic obstructive pulmonary disease) ([place])   . Depression   . Essential hypertension 08/19/2015  . GERD (gastroesophageal reflux disease)   . Headache   . History of hiatal hernia   . Myocardial infarction ([place])   . Shortness of breath dyspnea      Review of Systems neg for any significant sore throat, dysphagia, itching, sneezing, nasal congestion or excess/ purulent secretions, fever, chills, sweats, unintended wt loss, pleuritic or exertional cp, hempoptysis, orthopnea pnd or change in chronic leg  swelling.   Also denies presyncope, palpitations, heartburn, abdominal pain, nausea, vomiting, diarrhea or change in bowel or urinary habits, dysuria,hematuria, rash, arthralgias, visual complaints, headache, numbness weakness or ataxia.      Objective:   Physical Exam  Gen. Pleasant, thin in no distress ENT - no thrush, no post nasal drip Neck: No JVD, no thyromegaly, no carotid bruits Lungs: no use of accessory muscles, no dullness to percussion, BL scattered  rhonchi  Cardiovascular: Rhythm regular, heart sounds  normal, no murmurs or gallops, no peripheral edema Musculoskeletal: No deformities, no cyanosis or clubbing        Assessment & Plan:

## 2018-02-07 NOTE — Telephone Encounter (Signed)
LOV  01/19/18 Dr. Sylvan Cheese Baring

## 2018-02-07 NOTE — Patient Instructions (Addendum)
Prednisone 10 mg tabs Take 4 tabs  daily with food x 4 days, then 3 tabs daily x 4 days, then 2 tabs daily x 4 days, then 1 tab daily x4 days then stop. #40   Doxy 100 mg twice daily x 7 days  CXR today

## 2018-02-07 NOTE — Telephone Encounter (Signed)
Done erx 

## 2018-02-07 NOTE — Telephone Encounter (Signed)
Copied from [place] [id number]. Topic: Quick Communication - Rx Refill/Question >> Feb 07, 2018 10:11 AM Neva Seat wrote: ALPRAZolam Duanne Moron) 0.25 MG tablet Fioricet   Pt is low.  Needing refill.  [facility] [address], [place] [address] Phone: [phone] Fax: [phone]

## 2018-02-07 NOTE — Assessment & Plan Note (Signed)
Continue 2 L oxygen for now

## 2018-02-08 ENCOUNTER — Telehealth: Payer: Self-pay | Admitting: Emergency Medicine

## 2018-02-08 LAB — RESPIRATORY CULTURE OR RESPIRATORY AND SPUTUM CULTURE
MICRO NUMBER: 90436086
SPECIMEN QUALITY:: ADEQUATE

## 2018-02-08 NOTE — Telephone Encounter (Signed)
Called and spoke to patient. Relayed results of chest xray per Dr. Elsworth Soho. Nothing further is needed at this time.    Notes recorded by Madolyn Frieze, LPN on 12/04/9530 at 0:23 PM EDT Left message for patient will call back. ------  Notes recorded by Rigoberto Noel, MD on 02/07/2018 at 4:19 PM EDT No evidence of pneumonia

## 2018-02-10 ENCOUNTER — Telehealth: Payer: Self-pay | Admitting: Internal Medicine

## 2018-02-10 ENCOUNTER — Other Ambulatory Visit: Payer: Self-pay | Admitting: Internal Medicine

## 2018-02-10 ENCOUNTER — Telehealth: Payer: Self-pay | Admitting: Emergency Medicine

## 2018-02-10 DIAGNOSIS — R519 Headache, unspecified: Secondary | ICD-10-CM

## 2018-02-10 DIAGNOSIS — R51 Headache: Principal | ICD-10-CM

## 2018-02-10 MED ORDER — BUTALBITAL-APAP-CAFFEINE 50-300-40 MG PO CAPS: ORAL_CAPSULE | ORAL | 0 refills | Status: DC

## 2018-02-10 MED ORDER — BUTALBITAL-APAP-CAFFEINE 50-325-40 MG PO TABS: 1.0000 | ORAL_TABLET | Freq: Two times a day (BID) | ORAL | 0 refills | Status: DC | PRN

## 2018-02-10 NOTE — Telephone Encounter (Signed)
results given

## 2018-02-10 NOTE — Telephone Encounter (Signed)
Copied from [place]. Topic: Quick Communication - See Telephone Encounter >> Feb 10, 2018  2:22 PM Cleaster Corin, [place] wrote: CRM for notification. See Telephone encounter for: 02/10/18. Pt. Requesting lab results

## 2018-02-10 NOTE — Addendum Note (Signed)
Addended by: [name] on: 02/10/2018 04:50 PM   Modules accepted: Orders

## 2018-02-10 NOTE — Telephone Encounter (Signed)
Ok to increase but if can't get comfortable at rest needs ov asap or go to ER

## 2018-02-10 NOTE — Telephone Encounter (Signed)
Called pt who stated she saw RA 02/07/18 and was placed on prednisone and doxy at that visit due to being so SOB.  Pt states she is currently still taking meds but symptoms are no better than they were at that visit. Pt stated she is having difficulty breathing, has tightness in ribcage, and also her body is in a lot of pain.  Pt stated to me she also has a headache and because of all her symptoms, she is getting worn out easily.  Pt also has a trach and is currently on 2L O2 via the trach and is wanting to know if she could bump O2 up to 3L.  Dr. Melvyn Novas, please advise on the above for pt. Thanks!

## 2018-02-10 NOTE — Telephone Encounter (Signed)
Spoke with patient. She is aware of MWs recs. Nothing else needed at time of call.

## 2018-02-10 NOTE — Telephone Encounter (Signed)
Pt called for a refill of her butalbital-acetaminophen-caffeine (FIORICET, ESGIC) 50-325-40 MG tablet  Please advise

## 2018-02-10 NOTE — Telephone Encounter (Signed)
Done erx 

## 2018-02-12 ENCOUNTER — Emergency Department (HOSPITAL_COMMUNITY): Payer: Medicare Other

## 2018-02-12 ENCOUNTER — Inpatient Hospital Stay (HOSPITAL_COMMUNITY)
Admission: EM | Admit: 2018-02-12 | Discharge: 2018-02-27 | DRG: 871 | Disposition: A | Discharge status: Home Health | Payer: Medicare Other | Attending: Family Medicine | Admitting: Family Medicine

## 2018-02-12 ENCOUNTER — Encounter (HOSPITAL_COMMUNITY): Payer: Self-pay | Admitting: *Deleted

## 2018-02-12 ENCOUNTER — Other Ambulatory Visit: Payer: Self-pay

## 2018-02-12 ENCOUNTER — Other Ambulatory Visit: Payer: Self-pay | Admitting: Pulmonary Disease

## 2018-02-12 DIAGNOSIS — Z515 Encounter for palliative care: Secondary | ICD-10-CM

## 2018-02-12 DIAGNOSIS — F4310 Post-traumatic stress disorder, unspecified: Secondary | ICD-10-CM | POA: Diagnosis present

## 2018-02-12 DIAGNOSIS — R451 Restlessness and agitation: Secondary | ICD-10-CM | POA: Diagnosis present

## 2018-02-12 DIAGNOSIS — I252 Old myocardial infarction: Secondary | ICD-10-CM

## 2018-02-12 DIAGNOSIS — T380X5A Adverse effect of glucocorticoids and synthetic analogues, initial encounter: Secondary | ICD-10-CM | POA: Diagnosis not present

## 2018-02-12 DIAGNOSIS — I110 Hypertensive heart disease with heart failure: Secondary | ICD-10-CM | POA: Diagnosis present

## 2018-02-12 DIAGNOSIS — Z885 Allergy status to narcotic agent status: Secondary | ICD-10-CM

## 2018-02-12 DIAGNOSIS — J181 Lobar pneumonia, unspecified organism: Secondary | ICD-10-CM | POA: Diagnosis present

## 2018-02-12 DIAGNOSIS — R6521 Severe sepsis with septic shock: Secondary | ICD-10-CM | POA: Diagnosis not present

## 2018-02-12 DIAGNOSIS — F4312 Post-traumatic stress disorder, chronic: Secondary | ICD-10-CM

## 2018-02-12 DIAGNOSIS — J9602 Acute respiratory failure with hypercapnia: Secondary | ICD-10-CM

## 2018-02-12 DIAGNOSIS — J440 Chronic obstructive pulmonary disease with acute lower respiratory infection: Secondary | ICD-10-CM | POA: Diagnosis present

## 2018-02-12 DIAGNOSIS — Z9981 Dependence on supplemental oxygen: Secondary | ICD-10-CM

## 2018-02-12 DIAGNOSIS — E43 Unspecified severe protein-calorie malnutrition: Secondary | ICD-10-CM | POA: Diagnosis present

## 2018-02-12 DIAGNOSIS — I10 Essential (primary) hypertension: Secondary | ICD-10-CM | POA: Diagnosis present

## 2018-02-12 DIAGNOSIS — R739 Hyperglycemia, unspecified: Secondary | ICD-10-CM | POA: Diagnosis present

## 2018-02-12 DIAGNOSIS — J9621 Acute and chronic respiratory failure with hypoxia: Secondary | ICD-10-CM | POA: Diagnosis present

## 2018-02-12 DIAGNOSIS — Z8701 Personal history of pneumonia (recurrent): Secondary | ICD-10-CM

## 2018-02-12 DIAGNOSIS — N179 Acute kidney failure, unspecified: Secondary | ICD-10-CM | POA: Diagnosis present

## 2018-02-12 DIAGNOSIS — J9601 Acute respiratory failure with hypoxia: Secondary | ICD-10-CM | POA: Diagnosis not present

## 2018-02-12 DIAGNOSIS — Z818 Family history of other mental and behavioral disorders: Secondary | ICD-10-CM

## 2018-02-12 DIAGNOSIS — Z01818 Encounter for other preprocedural examination: Secondary | ICD-10-CM

## 2018-02-12 DIAGNOSIS — I2510 Atherosclerotic heart disease of native coronary artery without angina pectoris: Secondary | ICD-10-CM | POA: Diagnosis present

## 2018-02-12 DIAGNOSIS — Z8614 Personal history of Methicillin resistant Staphylococcus aureus infection: Secondary | ICD-10-CM

## 2018-02-12 DIAGNOSIS — E874 Mixed disorder of acid-base balance: Secondary | ICD-10-CM | POA: Diagnosis not present

## 2018-02-12 DIAGNOSIS — R5381 Other malaise: Secondary | ICD-10-CM | POA: Diagnosis not present

## 2018-02-12 DIAGNOSIS — M797 Fibromyalgia: Secondary | ICD-10-CM | POA: Diagnosis present

## 2018-02-12 DIAGNOSIS — I959 Hypotension, unspecified: Secondary | ICD-10-CM | POA: Diagnosis not present

## 2018-02-12 DIAGNOSIS — F329 Major depressive disorder, single episode, unspecified: Secondary | ICD-10-CM | POA: Diagnosis present

## 2018-02-12 DIAGNOSIS — Z880 Allergy status to penicillin: Secondary | ICD-10-CM

## 2018-02-12 DIAGNOSIS — R64 Cachexia: Secondary | ICD-10-CM | POA: Diagnosis present

## 2018-02-12 DIAGNOSIS — E872 Acidosis: Secondary | ICD-10-CM | POA: Diagnosis present

## 2018-02-12 DIAGNOSIS — J9622 Acute and chronic respiratory failure with hypercapnia: Secondary | ICD-10-CM | POA: Diagnosis not present

## 2018-02-12 DIAGNOSIS — Z8249 Family history of ischemic heart disease and other diseases of the circulatory system: Secondary | ICD-10-CM

## 2018-02-12 DIAGNOSIS — R918 Other nonspecific abnormal finding of lung field: Secondary | ICD-10-CM | POA: Diagnosis not present

## 2018-02-12 DIAGNOSIS — Z87891 Personal history of nicotine dependence: Secondary | ICD-10-CM

## 2018-02-12 DIAGNOSIS — Z7951 Long term (current) use of inhaled steroids: Secondary | ICD-10-CM

## 2018-02-12 DIAGNOSIS — J441 Chronic obstructive pulmonary disease with (acute) exacerbation: Secondary | ICD-10-CM | POA: Diagnosis present

## 2018-02-12 DIAGNOSIS — R34 Anuria and oliguria: Secondary | ICD-10-CM | POA: Diagnosis not present

## 2018-02-12 DIAGNOSIS — K219 Gastro-esophageal reflux disease without esophagitis: Secondary | ICD-10-CM | POA: Diagnosis present

## 2018-02-12 DIAGNOSIS — J449 Chronic obstructive pulmonary disease, unspecified: Secondary | ICD-10-CM | POA: Diagnosis present

## 2018-02-12 DIAGNOSIS — K449 Diaphragmatic hernia without obstruction or gangrene: Secondary | ICD-10-CM | POA: Diagnosis present

## 2018-02-12 DIAGNOSIS — Z978 Presence of other specified devices: Secondary | ICD-10-CM

## 2018-02-12 DIAGNOSIS — I5032 Chronic diastolic (congestive) heart failure: Secondary | ICD-10-CM | POA: Diagnosis not present

## 2018-02-12 DIAGNOSIS — Z7982 Long term (current) use of aspirin: Secondary | ICD-10-CM

## 2018-02-12 DIAGNOSIS — Z681 Body mass index (BMI) 19 or less, adult: Secondary | ICD-10-CM

## 2018-02-12 DIAGNOSIS — A419 Sepsis, unspecified organism: Secondary | ICD-10-CM | POA: Diagnosis not present

## 2018-02-12 DIAGNOSIS — J189 Pneumonia, unspecified organism: Secondary | ICD-10-CM

## 2018-02-12 DIAGNOSIS — E440 Moderate protein-calorie malnutrition: Secondary | ICD-10-CM

## 2018-02-12 DIAGNOSIS — F411 Generalized anxiety disorder: Secondary | ICD-10-CM | POA: Diagnosis present

## 2018-02-12 DIAGNOSIS — R0602 Shortness of breath: Secondary | ICD-10-CM

## 2018-02-12 DIAGNOSIS — R6520 Severe sepsis without septic shock: Secondary | ICD-10-CM | POA: Diagnosis not present

## 2018-02-12 DIAGNOSIS — Z79899 Other long term (current) drug therapy: Secondary | ICD-10-CM

## 2018-02-12 DIAGNOSIS — Z452 Encounter for adjustment and management of vascular access device: Secondary | ICD-10-CM | POA: Diagnosis not present

## 2018-02-12 DIAGNOSIS — R0682 Tachypnea, not elsewhere classified: Secondary | ICD-10-CM | POA: Diagnosis not present

## 2018-02-12 DIAGNOSIS — Z4659 Encounter for fitting and adjustment of other gastrointestinal appliance and device: Secondary | ICD-10-CM

## 2018-02-12 DIAGNOSIS — D509 Iron deficiency anemia, unspecified: Secondary | ICD-10-CM | POA: Diagnosis present

## 2018-02-12 LAB — CBC WITH DIFFERENTIAL/PLATELET
Basophils Absolute: 0 10*3/uL (ref 0.0–0.1)
Basophils Relative: 0 %
EOS PCT: 0 %
Eosinophils Absolute: 0 10*3/uL (ref 0.0–0.7)
HEMATOCRIT: 43.6 % (ref 36.0–46.0)
HEMOGLOBIN: 13.5 g/dL (ref 12.0–15.0)
LYMPHS ABS: 2.3 10*3/uL (ref 0.7–4.0)
LYMPHS PCT: 24 %
MCH: 30.8 pg (ref 26.0–34.0)
MCHC: 31 g/dL (ref 30.0–36.0)
MCV: 99.3 fL (ref 78.0–100.0)
MONOS PCT: 11 %
Monocytes Absolute: 1.1 10*3/uL — ABNORMAL HIGH (ref 0.1–1.0)
NEUTROS ABS: 6.3 10*3/uL (ref 1.7–7.7)
Neutrophils Relative %: 65 %
Platelets: 470 10*3/uL — ABNORMAL HIGH (ref 150–400)
RBC: 4.39 MIL/uL (ref 3.87–5.11)
RDW: 12.5 % (ref 11.5–15.5)
WBC: 9.7 10*3/uL (ref 4.0–10.5)

## 2018-02-12 LAB — I-STAT TROPONIN, ED: Troponin i, poc: 0.02 ng/mL (ref 0.00–0.08)

## 2018-02-12 LAB — BASIC METABOLIC PANEL
Anion gap: 10 (ref 5–15)
BUN: 12 mg/dL (ref 6–20)
CHLORIDE: 90 mmol/L — AB (ref 101–111)
CO2: 35 mmol/L — AB (ref 22–32)
Calcium: 8.5 mg/dL — ABNORMAL LOW (ref 8.9–10.3)
Creatinine, Ser: 0.66 mg/dL (ref 0.44–1.00)
GFR calc Af Amer: >60 mL/min (ref >60)
GFR calc non Af Amer: >60 mL/min (ref >60)
GLUCOSE: 151 mg/dL — AB (ref 65–99)
POTASSIUM: 4.7 mmol/L (ref 3.5–5.1)
Sodium: 135 mmol/L (ref 135–145)

## 2018-02-12 LAB — I-STAT CHEM 8, ED
BUN: 16 mg/dL (ref 6–20)
CALCIUM ION: 0.98 mmol/L — AB (ref 1.15–1.40)
CHLORIDE: 88 mmol/L — AB (ref 101–111)
Creatinine, Ser: 0.8 mg/dL (ref 0.44–1.00)
Glucose, Bld: 136 mg/dL — ABNORMAL HIGH (ref 65–99)
HEMATOCRIT: 44 % (ref 36.0–46.0)
Hemoglobin: 15 g/dL (ref 12.0–15.0)
Potassium: 4.6 mmol/L (ref 3.5–5.1)
SODIUM: 132 mmol/L — AB (ref 135–145)
TCO2: 38 mmol/L — AB (ref 22–32)

## 2018-02-12 LAB — CBG MONITORING, ED: Glucose-Capillary: 139 mg/dL — ABNORMAL HIGH (ref 65–99)

## 2018-02-12 LAB — I-STAT CG4 LACTIC ACID, ED: Lactic Acid, Venous: 2.11 mmol/L (ref 0.5–1.9)

## 2018-02-12 LAB — BRAIN NATRIURETIC PEPTIDE: B NATRIURETIC PEPTIDE 5: 184.2 pg/mL — AB (ref 0.0–100.0)

## 2018-02-12 MED ORDER — SODIUM CHLORIDE 0.9 % IV BOLUS
1000.0000 mL | Freq: Once | INTRAVENOUS | Status: AC
  Administered 2018-02-12: 1000 mL via INTRAVENOUS

## 2018-02-12 MED ORDER — IPRATROPIUM-ALBUTEROL 0.5-2.5 (3) MG/3ML IN SOLN
3.0000 mL | Freq: Once | RESPIRATORY_TRACT | Status: AC
  Administered 2018-02-12: 3 mL via RESPIRATORY_TRACT
  Filled 2018-02-12: qty 3

## 2018-02-12 MED ORDER — PROPOFOL 1000 MG/100ML IV EMUL
INTRAVENOUS | Status: AC
  Filled 2018-02-12: qty 100

## 2018-02-12 MED ORDER — VANCOMYCIN HCL IN DEXTROSE 1-5 GM/200ML-% IV SOLN
1000.0000 mg | Freq: Once | INTRAVENOUS | Status: AC
  Administered 2018-02-12: 1000 mg via INTRAVENOUS
  Filled 2018-02-12: qty 200

## 2018-02-12 MED ORDER — SODIUM CHLORIDE 0.9 % IV BOLUS (SEPSIS)
1000.0000 mL | Freq: Once | INTRAVENOUS | Status: AC
  Administered 2018-02-12: 1000 mL via INTRAVENOUS

## 2018-02-12 MED ORDER — SODIUM CHLORIDE 0.9 % IV BOLUS (SEPSIS)
500.0000 mL | Freq: Once | INTRAVENOUS | Status: AC
  Administered 2018-02-12: 500 mL via INTRAVENOUS

## 2018-02-12 MED ORDER — LEVOFLOXACIN IN D5W 750 MG/150ML IV SOLN
750.0000 mg | Freq: Once | INTRAVENOUS | Status: AC
  Administered 2018-02-12: 750 mg via INTRAVENOUS
  Filled 2018-02-12: qty 150

## 2018-02-12 MED ORDER — SODIUM CHLORIDE 0.9 % IV BOLUS: 1000.0000 mL | Freq: Once | INTRAVENOUS | Status: DC

## 2018-02-12 MED ORDER — MIDAZOLAM HCL 2 MG/2ML IJ SOLN
INTRAMUSCULAR | Status: AC
  Filled 2018-02-12: qty 2

## 2018-02-12 NOTE — ED Notes (Signed)
Attempted additional IV to L foot without success

## 2018-02-12 NOTE — ED Notes (Signed)
Xray currently in room.

## 2018-02-12 NOTE — Progress Notes (Addendum)
Repeat ABG obtained and CO2 was out of reportable range. MD notified. Contacting daughter for next steps of care.

## 2018-02-12 NOTE — ED Triage Notes (Signed)
Pt to ED by EMS c/o respiratory distress at home. Normally on 4L home oxygen, initial sats 86%; diminished lung sounds with accessory muscle use. C/o sob and headache x3 days. EMS game 5mg  albuterol, 0.5 atrovent; pulse 125 bp 200/100. Sats did increase to 90s on breathing treatment. Per EMS, pt became less responsive and nonverbal when checking in. Pt's extremities very cool to touch, no IV access on arrival.Verbal order for foot IV start given by Dr.Haviland

## 2018-02-12 NOTE — ED Notes (Signed)
Spoke to pt's daughter, [name] [phone]) who reports she and pt went shopping today. Pt had been feeling short of breath, took half a xanax pill (half of a 0.25mg ), and contacted her pulmonologist who told pt to take 2 additional doses which pt did. Pt has also took Excedrin x2 and a Fiorcet today. Daughter also requesting MD to speak with her before placing pt on a ventilator.

## 2018-02-12 NOTE — ED Notes (Signed)
Pt becoming more difficult to arouse with labored breathing on bipap

## 2018-02-12 NOTE — ED Notes (Signed)
RN and MD Attempted to contact patient's daughter regarding intubation; unable to contact pt

## 2018-02-12 NOTE — ED Notes (Signed)
Spoke to Dr.Haviland concerning fluids, 1L bag is currently hanging. Phlebotomy at bedside drawing cultures. Pt alert when stimulated

## 2018-02-12 NOTE — ED Provider Notes (Signed)
[facility]PART[facility] Provider Note   CSN: [id number] Arrival date & time: 02/12/18  2020     History   Chief Complaint Chief Complaint  Patient presents with  . Respiratory Distress    HPI Megan [name] is a 67 y.o. female.  Pt presents to the ED today with sob.  Pt is unable to give any hx.  Per EMS, the pt had been sob all day.  O2 sat in the mid 80s on her normal 3.5 L oxygen via [place].  The pt was placed on a 100% nrb for transport.  EMS said pt was talking with them until they arrived here, now she is not talking and is staring off into space.  Pt has a hx of copd exacerbation requiring intubation/trach last April.     Past Medical History:  Diagnosis Date  . Anemia   . Anxiety state 08/20/2015  . CAP (community acquired pneumonia)   . CHF (congestive heart failure) ([place])   . COPD (chronic obstructive pulmonary disease) ([place])   . Depression   . Essential hypertension 08/19/2015  . GERD (gastroesophageal reflux disease)   . Headache   . History of hiatal hernia   . Myocardial infarction ([place])   . Shortness of breath dyspnea     Patient Active Problem List   Diagnosis Date Noted  . Acute pain of right shoulder 01/07/2018  . Flu-like symptoms 12/09/2017  . Fibromyalgia 09/16/2017  . Fatigue 09/16/2017  . Chronic respiratory failure with hypoxia and hypercapnia (HCC)   . Hyperglycemia 01/05/2017  . Hair loss 06/22/2016  . Rash 06/22/2016  . Mixed headache 03/19/2016  . COPD (chronic obstructive pulmonary disease) ([place]) 03/19/2016  . Esophageal reflux 09/01/2015  . Goals of care, counseling/discussion   . Palliative care encounter   . Anxiety 08/20/2015  . Chronic post-traumatic stress disorder (PTSD) 08/20/2015  . Multiple pulmonary nodules determined by computed tomography of lung 08/19/2015  . Essential hypertension 08/19/2015  . COPD exacerbation ([place]) 08/19/2015  . Protein-calorie malnutrition, severe 08/19/2015    Past  Surgical History:  Procedure Laterality Date  . CARDIAC CATHETERIZATION    . CARDIAC CATHETERIZATION N/A 08/26/2015   Procedure: Left Heart Cath and Coronary Angiography;  Surgeon: Jettie Booze, MD;  Location: [facility];  Service: Cardiovascular;  Laterality: N/A;     OB History   None      Home Medications    Prior to Admission medications   Medication Sig Start Date End Date Taking? Authorizing Provider  ADVAIR DISKUS 500-50 MCG/DOSE AEPB TAKE 1 INHALATION BY MOUTH TWICE DAILY FOR COPD**RINSE MOUTH WITH WATER AFTER USE** DO NOT SWALLOW 12/09/17   [name], MD  ALPRAZolam Duanne Moron) 0.25 MG tablet Take 1 tablet (0.25 mg total) by mouth 2 (two) times daily as needed. for anxiety 02/07/18   [name], MD  aspirin EC 81 MG tablet Take 81 mg by mouth daily.    [provider]  budesonide (PULMICORT) 0.5 MG/2ML nebulizer solution Take 2 mLs (0.5 mg total) by nebulization 2 (two) times daily. 03/08/17 04/07/17  Damita Lack, MD  butalbital-acetaminophen-caffeine (FIORICET, ESGIC) [phone] MG tablet Take 1 tablet by mouth 2 (two) times daily as needed for headache. 02/10/18   [name], MD  Butalbital-APAP-Caffeine 50-300-40 MG CAPS TAKE ONE CAPSULE BY MOUTH EVERY 6 HOURS AS NEEDED FOR HEADACHE 02/10/18   [name], MD  diltiazem (CARDIZEM CD) 240 MG 24 hr capsule Take 1 capsule (  240 mg total) by mouth daily. 01/12/17   Eugenie Filler, MD  diltiazem (TIAZAC) 240 MG 24 hr capsule TK ONE C PO ONCE D 12/19/17   [provider]  doxycycline (VIBRA-TABS) 100 MG tablet Take 1 tablet (100 mg total) by mouth 2 (two) times daily. 02/07/18   Rigoberto Noel, MD  feeding supplement, ENSURE ENLIVE, (ENSURE ENLIVE) LIQD Take 237 mLs by mouth 4 (four) times daily. 01/11/17   Eugenie Filler, MD  fluticasone (FLONASE) 50 MCG/ACT nasal spray Place 2 sprays into both nostrils daily. 01/04/18   Marrian Salvage, FNP  INCRUSE ELLIPTA 62.5 MCG/INH AEPB INHALE 1  PUFF INTO THE LUNGS DAILY 01/26/18   Magdalen Spatz, NP  levalbuterol [facility] HFA) 45 MCG/ACT inhaler INHALE 1 TO 2 PUFFS INTO THE LUNGS EVERY 4 HOURS AS NEEDED FOR WHEEZING 08/01/17   Golden Circle, FNP  levalbuterol (XOPENEX) 0.63 MG/3ML nebulizer solution Take 3 mLs (0.63 mg total) by nebulization every 4 (four) hours as needed for wheezing. 03/08/17   Amin, Jeanella Flattery, MD  levalbuterol (XOPENEX) 1.25 MG/0.5ML nebulizer solution Take 1.25 mg by nebulization every 6 (six) hours. 03/08/17   Damita Lack, MD  loratadine (CLARITIN) 10 MG tablet Take 1 tablet (10 mg total) daily by mouth. 09/16/17   [name], MD  metoprolol tartrate (LOPRESSOR) 25 MG tablet Take 0.5 tablets (12.5 mg total) by mouth 2 (two) times daily. 11/08/17   [name], MD  pantoprazole (PROTONIX) 40 MG tablet TAKE 1 TABLET BY MOUTH EVERY DAY FOR GASTRO-ESOPHAGEAL REFLUX DISEASE 11/07/17   [name], MD  polyethylene glycol [facility] / Floria Raveling) packet Take 17 g by mouth 2 (two) times daily. 01/11/17   Eugenie Filler, MD  predniSONE (DELTASONE) 10 MG tablet 4 tabs x 4days then 3 tabs x4days , 2 tabs for 4 day , 1 tab x 4 days. 02/07/18   Rigoberto Noel, MD  sertraline (ZOLOFT) 25 MG tablet TAKE 1 TABLET(25 MG) BY MOUTH DAILY 08/01/17   Golden Circle, FNP  tiZANidine (ZANAFLEX) 4 MG tablet TAKE 1 TABLET(4 MG) BY MOUTH EVERY 6 HOURS AS NEEDED FOR MUSCLE SPASMS 09/16/17   [name], MD  XOPENEX HFA 45 MCG/ACT inhaler INHALE 1 TO 2 PUFFS INTO THE LUNGS EVERY 4 HOURS AS NEEDED FOR WHEEZING 10/26/17   Magdalen Spatz, NP    Family History Family History  Problem Relation Age of Onset  . Cirrhosis Mother   . Depression Mother   . Heart disease Father     Social History Social History   Tobacco Use  . Smoking status: Former Smoker    Packs/day: 1.00    Years: 30.00    Pack years: 30.00    Types: Cigarettes    Last attempt to quit: 08/18/2000    Years since quitting: 17.5  . Smokeless tobacco: Never  Used  Substance Use Topics  . Alcohol use: No  . Drug use: No     Allergies   Fentanyl and related and Penicillins   Review of Systems Review of Systems  Unable to perform ROS: Mental status change  All other systems reviewed and are negative.    Physical Exam Updated Vital Signs BP (!) 55/45   Pulse (!) 122   Temp (!) 96 F (35.6 C)   Resp 19   SpO2 100%   Physical Exam  Constitutional: She appears lethargic. She appears distressed.  HENT:  Head: Normocephalic and atraumatic.  Right Ear: External ear normal.  Left Ear: External ear normal.  Nose: Nose normal.  Eyes: Pupils are equal, round, and reactive to light.  Neck: Normal range of motion. Neck supple.  Cardiovascular: Normal rate, regular rhythm, normal heart sounds and intact distal pulses.  Pulmonary/Chest: Accessory muscle usage present. Tachypnea noted. She is in respiratory distress. She has wheezes.  Abdominal: Soft. Bowel sounds are normal.  Musculoskeletal: Normal range of motion.  Neurological: She appears lethargic.  Skin: Skin is warm. Capillary refill takes less than 2 seconds.  Nursing note and vitals reviewed.    ED Treatments / Results  Labs (all labs ordered are listed, but only abnormal results are displayed) Labs Reviewed  BASIC METABOLIC PANEL - Abnormal; Notable for the following components:      Result Value   Chloride 90 (*)    CO2 35 (*)    Glucose, Bld 151 (*)    Calcium 8.5 (*)    All other components within normal limits  CBC WITH DIFFERENTIAL/PLATELET - Abnormal; Notable for the following components:   Platelets 470 (*)    Monocytes Absolute 1.1 (*)    All other components within normal limits  BRAIN NATRIURETIC PEPTIDE - Abnormal; Notable for the following components:   B Natriuretic Peptide 184.2 (*)    All other components within normal limits  I-STAT CG4 LACTIC ACID, ED - Abnormal; Notable for the following components:   Lactic Acid, Venous 2.11 (*)    All other  components within normal limits  I-STAT CHEM 8, ED - Abnormal; Notable for the following components:   Sodium 132 (*)    Chloride 88 (*)    Glucose, Bld 136 (*)    Calcium, Ion 0.98 (*)    TCO2 38 (*)    All other components within normal limits  CBG MONITORING, ED - Abnormal; Notable for the following components:   Glucose-Capillary 139 (*)    All other components within normal limits  CULTURE, BLOOD (ROUTINE X 2)  CULTURE, BLOOD (ROUTINE X 2)  RESPIRATORY PANEL BY PCR  BLOOD GAS, ARTERIAL  URINALYSIS, ROUTINE W REFLEX MICROSCOPIC  BLOOD GAS, ARTERIAL  I-STAT TROPONIN, ED  I-STAT ARTERIAL BLOOD GAS, ED  I-STAT ARTERIAL BLOOD GAS, ED  I-STAT CG4 LACTIC ACID, ED    EKG None  Radiology Dg Chest Portable 1 View  Result Date: 02/12/2018 CLINICAL DATA:  Endotracheal tube, central line and orogastric tube placement. EXAM: PORTABLE CHEST 1 VIEW COMPARISON:  Chest radiograph performed earlier today at 8:25 p.m. FINDINGS: The patient's endotracheal tube is seen ending 2 cm above the carina. An enteric tube is noted extending below the diaphragm. A right IJ line is noted ending about the mid SVC. The lungs are hyperexpanded, with flattening of the diaphragms, reflecting emphysema. The lung bases are incompletely characterized on this study. No significant pleural effusion or pneumothorax is seen. The cardiomediastinal silhouette is normal in size. No acute osseous abnormalities are identified. IMPRESSION: 1. Endotracheal tube seen ending 2 cm above the carina. 2. Enteric tube noted extending below the diaphragm. 3. Right IJ line noted ending about the mid SVC. 4. Emphysema.  Lungs otherwise grossly clear. Electronically Signed   By: Garald Balding M.D.   On: 02/12/2018 23:47   Dg Chest Port 1 View  Result Date: 02/12/2018 CLINICAL DATA:  Respiratory distress EXAM: PORTABLE CHEST 1 VIEW COMPARISON:  02/07/2018 FINDINGS: Left lower lobe opacity, new/increased from the prior, suspicious for  pneumonia. Right lung is essentially clear, noting hyperinflation  with emphysematous changes. Possible small left pleural effusion.  No pneumothorax. The heart is normal in size. IMPRESSION: Left lower lobe opacity, suspicious for pneumonia. Electronically Signed   By: Julian Hy M.D.   On: 02/12/2018 21:08    Procedures Procedure Name: Intubation Date/Time: 02/13/2018 12:00 AM Performed by: Isla Pence, MD Pre-anesthesia Checklist: Patient identified, Patient being monitored, Emergency Drugs available, Timeout performed and Suction available Oxygen Delivery Method: Ambu bag Preoxygenation: Pre-oxygenation with 100% oxygen Induction Type: Rapid sequence Ventilation: Mask ventilation without difficulty Laryngoscope Size: Miller and 3 Grade View: Grade I Tube size: 7.5 mm Number of attempts: 1 Placement Confirmation: ETT inserted through vocal cords under direct vision,  Positive ETCO2,  CO2 detector and Breath sounds checked- equal and bilateral Secured at: 23 cm Tube secured with: ETT holder Dental Injury: Teeth and Oropharynx as per pre-operative assessment     .Central Line Date/Time: 02/13/2018 12:01 AM Performed by: Isla Pence, MD Authorized by: Isla Pence, MD   Consent:    Consent obtained:  Emergent situation   Consent given by:  Patient Universal protocol:    Procedure explained and questions answered to patient or proxy's satisfaction: yes     Relevant documents present and verified: yes     Test results available and properly labeled: yes     Imaging studies available: yes     Required blood products, implants, devices, and special equipment available: yes     Site/side marked: yes     Immediately prior to procedure, a time out was called: yes     Patient identity confirmed:  Arm band Pre-procedure details:    Hand hygiene: Hand hygiene performed prior to insertion     Sterile barrier technique: All elements of maximal sterile technique followed       Skin preparation:  2% chlorhexidine   Skin preparation agent: Skin preparation agent completely dried prior to procedure   Anesthesia (see MAR for exact dosages):    Anesthesia method:  Local infiltration   Local anesthetic:  Lidocaine 1% w/o epi Procedure details:    Location:  R internal jugular   Patient position:  Flat   Procedural supplies:  Triple lumen   Landmarks identified: yes     Ultrasound guidance: yes     Sterile ultrasound techniques: Sterile gel and sterile probe covers were used     Number of attempts:  1   Successful placement: yes   Post-procedure details:    Post-procedure:  Dressing applied and line sutured   Assessment:  Blood return through all ports, no pneumothorax on x-ray, free fluid flow and placement verified by x-ray   Patient tolerance of procedure:  Tolerated well, no immediate complications   (including critical care time) CRITICAL CARE Performed by: Isla Pence   Total critical care time: 90 minutes  Critical care time was exclusive of separately billable procedures and treating other patients.  Critical care was necessary to treat or prevent imminent or life-threatening deterioration.  Critical care was time spent personally by me on the following activities: development of treatment plan with patient and/or surrogate as well as nursing, discussions with consultants, evaluation of patient's response to treatment, examination of patient, obtaining history from patient or surrogate, ordering and performing treatments and interventions, ordering and review of laboratory studies, ordering and review of radiographic studies, pulse oximetry and re-evaluation of patient's condition.  Medications Ordered in ED Medications  propofol (DIPRIVAN) 1000 MG/100ML infusion (has no administration in time range)  midazolam (VERSED) injection 2  mg (has no administration in time range)  midazolam (VERSED) 2 MG/2ML injection (has no administration in time  range)  ipratropium-albuterol (DUONEB) 0.5-2.5 (3) MG/3ML nebulizer solution 3 mL (3 mLs Nebulization Given 02/12/18 2030)  sodium chloride 0.9 % bolus 1,000 mL (0 mLs Intravenous Stopped 02/12/18 2359)  levofloxacin (LEVAQUIN) IVPB 750 mg (750 mg Intravenous New Bag/Given 02/12/18 2200)  vancomycin (VANCOCIN) IVPB 1000 mg/200 mL premix (1,000 mg Intravenous New Bag/Given 02/12/18 2200)  sodium chloride 0.9 % bolus 1,000 mL (0 mLs Intravenous Stopped 02/12/18 2359)    And  sodium chloride 0.9 % bolus 500 mL (0 mLs Intravenous Stopped 02/12/18 2359)     Initial Impression / Assessment and Plan / ED Course  I have reviewed the triage vital signs and the nursing notes.  Pertinent labs & imaging results that were available during my care of the patient were reviewed by me and considered in my medical decision making (see chart for details).  Pt placed on bipap immediately upon arrival which did seem to help her mental status improve for brief while.  The pt's daughter was called and said she wanted [place] to try to hold off on intubation because she had a difficult time getting weaned in the past and required a trach.  CXR showed lll pna, so she was given Iv abx and sepsis fluids.  The pt's 2nd blood gas worse than the first and ms worsened as well.  The daughter was contacted and agreed to go ahead with intubation.  Shortly before intubation, her bp dropped and she required pressor support with levophed.  Pt had poor venous access so I placed a right IJ under US guidance.  Pt tolerated both procedures well.  The pt's bp is improving and ms has also improved, so pt was put on propofol.     Pt d/w critical care who will admit.    Final Clinical Impressions(s) / ED Diagnoses   Final diagnoses:  COPD exacerbation ([place])  Sepsis, due to unspecified organism [facility])  Community acquired pneumonia of left lower lobe of lung ([place])  Acute respiratory failure with hypoxia and hypercapnia [facility])    ED Discharge  Orders    None       Isla Pence, MD 02/13/18 0010

## 2018-02-12 NOTE — Progress Notes (Signed)
ABG obtained and CO2 was out of reportable range. MD notified and to recollect at 2145.

## 2018-02-12 NOTE — Progress Notes (Signed)
Received call via Answering Service. Patient was recently seen for COPD exacerbation and reports increased dyspnea particularly exhaling. Reports increased anxiety.   Able to speak clearly in full sentences.   Asked for permission to take extra Xanax.  Advised her to try extra dose of Xanax, if anxiety component to dyspnea, breathing should improve promptly.  Otherwise, should be seen in ED or call office in am based on how dyspneic she feels.   Patient agreed with plan.

## 2018-02-12 NOTE — ED Notes (Signed)
Multiple IV sticks (x3) to L and R arms without success

## 2018-02-13 ENCOUNTER — Inpatient Hospital Stay (HOSPITAL_COMMUNITY): Payer: Medicare Other

## 2018-02-13 ENCOUNTER — Telehealth: Payer: Self-pay | Admitting: Acute Care

## 2018-02-13 DIAGNOSIS — A419 Sepsis, unspecified organism: Secondary | ICD-10-CM | POA: Diagnosis not present

## 2018-02-13 DIAGNOSIS — F411 Generalized anxiety disorder: Secondary | ICD-10-CM | POA: Diagnosis present

## 2018-02-13 DIAGNOSIS — Z4682 Encounter for fitting and adjustment of non-vascular catheter: Secondary | ICD-10-CM | POA: Diagnosis not present

## 2018-02-13 DIAGNOSIS — J9602 Acute respiratory failure with hypercapnia: Secondary | ICD-10-CM | POA: Diagnosis not present

## 2018-02-13 DIAGNOSIS — R918 Other nonspecific abnormal finding of lung field: Secondary | ICD-10-CM | POA: Diagnosis not present

## 2018-02-13 DIAGNOSIS — J9601 Acute respiratory failure with hypoxia: Secondary | ICD-10-CM | POA: Diagnosis not present

## 2018-02-13 DIAGNOSIS — R64 Cachexia: Secondary | ICD-10-CM | POA: Diagnosis present

## 2018-02-13 DIAGNOSIS — E872 Acidosis: Secondary | ICD-10-CM | POA: Diagnosis not present

## 2018-02-13 DIAGNOSIS — E440 Moderate protein-calorie malnutrition: Secondary | ICD-10-CM | POA: Diagnosis not present

## 2018-02-13 DIAGNOSIS — R9431 Abnormal electrocardiogram [ECG] [EKG]: Secondary | ICD-10-CM | POA: Diagnosis not present

## 2018-02-13 DIAGNOSIS — F4310 Post-traumatic stress disorder, unspecified: Secondary | ICD-10-CM | POA: Diagnosis present

## 2018-02-13 DIAGNOSIS — Z681 Body mass index (BMI) 19 or less, adult: Secondary | ICD-10-CM | POA: Diagnosis not present

## 2018-02-13 DIAGNOSIS — K449 Diaphragmatic hernia without obstruction or gangrene: Secondary | ICD-10-CM | POA: Diagnosis present

## 2018-02-13 DIAGNOSIS — N179 Acute kidney failure, unspecified: Secondary | ICD-10-CM | POA: Diagnosis present

## 2018-02-13 DIAGNOSIS — K219 Gastro-esophageal reflux disease without esophagitis: Secondary | ICD-10-CM | POA: Diagnosis present

## 2018-02-13 DIAGNOSIS — E874 Mixed disorder of acid-base balance: Secondary | ICD-10-CM | POA: Diagnosis not present

## 2018-02-13 DIAGNOSIS — Z515 Encounter for palliative care: Secondary | ICD-10-CM | POA: Diagnosis not present

## 2018-02-13 DIAGNOSIS — R0602 Shortness of breath: Secondary | ICD-10-CM | POA: Diagnosis not present

## 2018-02-13 DIAGNOSIS — J9690 Respiratory failure, unspecified, unspecified whether with hypoxia or hypercapnia: Secondary | ICD-10-CM | POA: Diagnosis not present

## 2018-02-13 DIAGNOSIS — J440 Chronic obstructive pulmonary disease with acute lower respiratory infection: Secondary | ICD-10-CM | POA: Diagnosis present

## 2018-02-13 DIAGNOSIS — R6521 Severe sepsis with septic shock: Secondary | ICD-10-CM | POA: Diagnosis present

## 2018-02-13 DIAGNOSIS — F329 Major depressive disorder, single episode, unspecified: Secondary | ICD-10-CM | POA: Diagnosis present

## 2018-02-13 DIAGNOSIS — I5032 Chronic diastolic (congestive) heart failure: Secondary | ICD-10-CM | POA: Diagnosis present

## 2018-02-13 DIAGNOSIS — J181 Lobar pneumonia, unspecified organism: Secondary | ICD-10-CM | POA: Diagnosis present

## 2018-02-13 DIAGNOSIS — I2510 Atherosclerotic heart disease of native coronary artery without angina pectoris: Secondary | ICD-10-CM | POA: Diagnosis present

## 2018-02-13 DIAGNOSIS — J449 Chronic obstructive pulmonary disease, unspecified: Secondary | ICD-10-CM | POA: Diagnosis not present

## 2018-02-13 DIAGNOSIS — G9340 Encephalopathy, unspecified: Secondary | ICD-10-CM | POA: Diagnosis not present

## 2018-02-13 DIAGNOSIS — M797 Fibromyalgia: Secondary | ICD-10-CM | POA: Diagnosis present

## 2018-02-13 DIAGNOSIS — Z7189 Other specified counseling: Secondary | ICD-10-CM | POA: Diagnosis not present

## 2018-02-13 DIAGNOSIS — J9621 Acute and chronic respiratory failure with hypoxia: Secondary | ICD-10-CM | POA: Diagnosis present

## 2018-02-13 DIAGNOSIS — I252 Old myocardial infarction: Secondary | ICD-10-CM | POA: Diagnosis not present

## 2018-02-13 DIAGNOSIS — J9622 Acute and chronic respiratory failure with hypercapnia: Secondary | ICD-10-CM | POA: Diagnosis not present

## 2018-02-13 DIAGNOSIS — J441 Chronic obstructive pulmonary disease with (acute) exacerbation: Secondary | ICD-10-CM

## 2018-02-13 DIAGNOSIS — I110 Hypertensive heart disease with heart failure: Secondary | ICD-10-CM | POA: Diagnosis present

## 2018-02-13 DIAGNOSIS — I959 Hypotension, unspecified: Secondary | ICD-10-CM | POA: Diagnosis not present

## 2018-02-13 LAB — BLOOD GAS, ARTERIAL
ACID-BASE EXCESS: 1.9 mmol/L (ref 0.0–2.0)
BICARBONATE: 30 mmol/L — AB (ref 20.0–28.0)
Drawn by: 313941
FIO2: 100
LHR: 18 {breaths}/min
O2 SAT: 99.2 %
PATIENT TEMPERATURE: 99.1
PCO2 ART: 89.5 mmHg — AB (ref 32.0–48.0)
PEEP: 5 cmH2O
PH ART: 7.154 — AB (ref 7.350–7.450)
VT: 420 mL
pO2, Arterial: 280 mmHg — ABNORMAL HIGH (ref 83.0–108.0)

## 2018-02-13 LAB — RESPIRATORY PANEL BY PCR
Adenovirus: NOT DETECTED
BORDETELLA PERTUSSIS-RVPCR: NOT DETECTED
CHLAMYDOPHILA PNEUMONIAE-RVPPCR: NOT DETECTED
Coronavirus 229E: NOT DETECTED
Coronavirus HKU1: NOT DETECTED
Coronavirus NL63: NOT DETECTED
Coronavirus OC43: NOT DETECTED
INFLUENZA A-RVPPCR: NOT DETECTED
INFLUENZA B-RVPPCR: NOT DETECTED
METAPNEUMOVIRUS-RVPPCR: DETECTED — AB
Mycoplasma pneumoniae: NOT DETECTED
PARAINFLUENZA VIRUS 2-RVPPCR: NOT DETECTED
PARAINFLUENZA VIRUS 3-RVPPCR: NOT DETECTED
PARAINFLUENZA VIRUS 4-RVPPCR: NOT DETECTED
Parainfluenza Virus 1: NOT DETECTED
RESPIRATORY SYNCYTIAL VIRUS-RVPPCR: NOT DETECTED
RHINOVIRUS / ENTEROVIRUS - RVPPCR: NOT DETECTED

## 2018-02-13 LAB — BASIC METABOLIC PANEL
ANION GAP: 7 (ref 5–15)
BUN: 18 mg/dL (ref 6–20)
CO2: 33 mmol/L — AB (ref 22–32)
Calcium: 7.6 mg/dL — ABNORMAL LOW (ref 8.9–10.3)
Chloride: 94 mmol/L — ABNORMAL LOW (ref 101–111)
Creatinine, Ser: 1.05 mg/dL — ABNORMAL HIGH (ref 0.44–1.00)
GFR calc Af Amer: >60 mL/min (ref >60)
GFR calc non Af Amer: 55 mL/min — ABNORMAL LOW (ref >60)
GLUCOSE: 211 mg/dL — AB (ref 65–99)
Potassium: 4 mmol/L (ref 3.5–5.1)
Sodium: 134 mmol/L — ABNORMAL LOW (ref 135–145)

## 2018-02-13 LAB — PROCALCITONIN: PROCALCITONIN: 0.5 ng/mL

## 2018-02-13 LAB — GLUCOSE, CAPILLARY
GLUCOSE-CAPILLARY: 148 mg/dL — AB (ref 65–99)
GLUCOSE-CAPILLARY: 156 mg/dL — AB (ref 65–99)
GLUCOSE-CAPILLARY: 159 mg/dL — AB (ref 65–99)
Glucose-Capillary: 203 mg/dL — ABNORMAL HIGH (ref 65–99)
Glucose-Capillary: 194 mg/dL — ABNORMAL HIGH (ref 65–99)

## 2018-02-13 LAB — CBC
HCT: 33.1 % — ABNORMAL LOW (ref 36.0–46.0)
HEMOGLOBIN: 10.3 g/dL — AB (ref 12.0–15.0)
MCH: 31.1 pg (ref 26.0–34.0)
MCHC: 31.1 g/dL (ref 30.0–36.0)
MCV: 100 fL (ref 78.0–100.0)
Platelets: 311 10*3/uL (ref 150–400)
RBC: 3.31 MIL/uL — ABNORMAL LOW (ref 3.87–5.11)
RDW: 13 % (ref 11.5–15.5)
WBC: 9.3 10*3/uL (ref 4.0–10.5)

## 2018-02-13 LAB — POCT I-STAT 3, ART BLOOD GAS (G3+)
Acid-Base Excess: 8 mmol/L — ABNORMAL HIGH (ref 0.0–2.0)
BICARBONATE: 36 mmol/L — AB (ref 20.0–28.0)
O2 Saturation: 96 %
PCO2 ART: 67.8 mmHg — AB (ref 32.0–48.0)
PO2 ART: 90 mmHg (ref 83.0–108.0)
Patient temperature: 98.6
TCO2: 38 mmol/L — ABNORMAL HIGH (ref 22–32)
pH, Arterial: 7.333 — ABNORMAL LOW (ref 7.350–7.450)

## 2018-02-13 LAB — MAGNESIUM: Magnesium: 1.6 mg/dL — ABNORMAL LOW (ref 1.7–2.4)

## 2018-02-13 LAB — LACTIC ACID, PLASMA: Lactic Acid, Venous: 0.9 mmol/L (ref 0.5–1.9)

## 2018-02-13 LAB — MRSA PCR SCREENING: MRSA BY PCR: NEGATIVE

## 2018-02-13 LAB — HIV ANTIBODY (ROUTINE TESTING W REFLEX): HIV Screen 4th Generation wRfx: NONREACTIVE

## 2018-02-13 LAB — PHOSPHORUS: PHOSPHORUS: 3 mg/dL (ref 2.5–4.6)

## 2018-02-13 LAB — CORTISOL: CORTISOL PLASMA: 4.2 ug/dL

## 2018-02-13 MED ORDER — PANTOPRAZOLE SODIUM 40 MG IV SOLR
40.0000 mg | Freq: Every day | INTRAVENOUS | Status: DC
  Administered 2018-02-13: 40 mg via INTRAVENOUS
  Filled 2018-02-13 (×2): qty 40

## 2018-02-13 MED ORDER — INSULIN ASPART 100 UNIT/ML ~~LOC~~ SOLN
0.0000 [IU] | SUBCUTANEOUS | Status: DC
  Administered 2018-02-13: 2 [IU] via SUBCUTANEOUS
  Administered 2018-02-13: 1 [IU] via SUBCUTANEOUS
  Administered 2018-02-13 (×2): 2 [IU] via SUBCUTANEOUS
  Administered 2018-02-14 (×2): 1 [IU] via SUBCUTANEOUS
  Administered 2018-02-14: 3 [IU] via SUBCUTANEOUS
  Administered 2018-02-14: 2 [IU] via SUBCUTANEOUS
  Administered 2018-02-15: 1 [IU] via SUBCUTANEOUS

## 2018-02-13 MED ORDER — FENTANYL CITRATE (PF) 100 MCG/2ML IJ SOLN
25.0000 ug | Freq: Once | INTRAMUSCULAR | Status: AC
  Administered 2018-02-13: 100 ug via INTRAVENOUS
  Filled 2018-02-13: qty 2

## 2018-02-13 MED ORDER — VANCOMYCIN HCL IN DEXTROSE 1-5 GM/200ML-% IV SOLN: 1000.0000 mg | INTRAVENOUS | Status: DC

## 2018-02-13 MED ORDER — METHYLPREDNISOLONE SODIUM SUCC 125 MG IJ SOLR: 125.0000 mg | Freq: Once | INTRAMUSCULAR | Status: DC

## 2018-02-13 MED ORDER — LACTATED RINGERS IV BOLUS
1000.0000 mL | Freq: Once | INTRAVENOUS | Status: AC
  Administered 2018-02-13: 1000 mL via INTRAVENOUS

## 2018-02-13 MED ORDER — LORAZEPAM 2 MG/ML IJ SOLN
0.5000 mg | Freq: Once | INTRAMUSCULAR | Status: AC
  Administered 2018-02-13: 0.5 mg via INTRAVENOUS
  Filled 2018-02-13: qty 1

## 2018-02-13 MED ORDER — SUCCINYLCHOLINE CHLORIDE 20 MG/ML IJ SOLN
INTRAMUSCULAR | Status: DC | PRN
  Administered 2018-02-12: 100 mg via INTRAVENOUS

## 2018-02-13 MED ORDER — IPRATROPIUM-ALBUTEROL 0.5-2.5 (3) MG/3ML IN SOLN
3.0000 mL | Freq: Four times a day (QID) | RESPIRATORY_TRACT | Status: DC
  Administered 2018-02-13 – 2018-02-21 (×35): 3 mL via RESPIRATORY_TRACT
  Filled 2018-02-13 (×35): qty 3

## 2018-02-13 MED ORDER — PROPOFOL 1000 MG/100ML IV EMUL
5.0000 ug/kg/min | INTRAVENOUS | Status: DC
  Administered 2018-02-12: 5 ug/kg/min via INTRAVENOUS
  Administered 2018-02-12: 10 ug/kg/min via INTRAVENOUS
  Filled 2018-02-13: qty 100

## 2018-02-13 MED ORDER — ASPIRIN 81 MG PO CHEW: 324.0000 mg | CHEWABLE_TABLET | ORAL | Status: AC

## 2018-02-13 MED ORDER — BUDESONIDE 0.25 MG/2ML IN SUSP
0.2500 mg | Freq: Two times a day (BID) | RESPIRATORY_TRACT | Status: DC
  Administered 2018-02-13 – 2018-02-15 (×5): 0.25 mg via RESPIRATORY_TRACT
  Filled 2018-02-13 (×6): qty 2

## 2018-02-13 MED ORDER — VITAL AF 1.2 CAL PO LIQD
1000.0000 mL | ORAL | Status: DC
Start: 2018-02-13 — End: 2018-02-14
  Administered 2018-02-13: 1000 mL

## 2018-02-13 MED ORDER — DEXMEDETOMIDINE HCL IN NACL 200 MCG/50ML IV SOLN: 0.4000 ug/kg/h | INTRAVENOUS | Status: DC

## 2018-02-13 MED ORDER — HEPARIN SODIUM (PORCINE) 5000 UNIT/ML IJ SOLN
5000.0000 [IU] | Freq: Three times a day (TID) | INTRAMUSCULAR | Status: DC
  Administered 2018-02-13 – 2018-02-27 (×43): 5000 [IU] via SUBCUTANEOUS
  Filled 2018-02-13 (×44): qty 1

## 2018-02-13 MED ORDER — SODIUM CHLORIDE 0.9 % IV SOLN
INTRAVENOUS | Status: DC | PRN
  Administered 2018-02-15: 05:00:00 via INTRA_ARTERIAL

## 2018-02-13 MED ORDER — MIDAZOLAM HCL 2 MG/2ML IJ SOLN: 2.0000 mg | Freq: Once | INTRAMUSCULAR | Status: DC

## 2018-02-13 MED ORDER — ETOMIDATE 2 MG/ML IV SOLN
INTRAVENOUS | Status: DC | PRN
  Administered 2018-02-12: 20 mg via INTRAVENOUS

## 2018-02-13 MED ORDER — PANTOPRAZOLE SODIUM 40 MG PO PACK
40.0000 mg | PACK | Freq: Every day | ORAL | Status: DC
  Administered 2018-02-13 – 2018-02-14 (×2): 40 mg
  Filled 2018-02-13 (×3): qty 20

## 2018-02-13 MED ORDER — ALPRAZOLAM 0.25 MG PO TABS
0.2500 mg | ORAL_TABLET | Freq: Two times a day (BID) | ORAL | Status: DC | PRN
  Administered 2018-02-13 (×2): 0.25 mg
  Filled 2018-02-13 (×2): qty 1

## 2018-02-13 MED ORDER — METHYLPREDNISOLONE SODIUM SUCC 125 MG IJ SOLR
125.0000 mg | Freq: Once | INTRAMUSCULAR | Status: AC
  Administered 2018-02-13: 125 mg via INTRAVENOUS
  Filled 2018-02-13: qty 2

## 2018-02-13 MED ORDER — DEXMEDETOMIDINE HCL IN NACL 400 MCG/100ML IV SOLN
0.4000 ug/kg/h | INTRAVENOUS | Status: DC
  Administered 2018-02-13: 1.2 ug/kg/h via INTRAVENOUS
  Administered 2018-02-13: 0.4 ug/kg/h via INTRAVENOUS
  Administered 2018-02-13 – 2018-02-14 (×2): 1.2 ug/kg/h via INTRAVENOUS
  Filled 2018-02-13 (×6): qty 100

## 2018-02-13 MED ORDER — ASPIRIN 300 MG RE SUPP
300.0000 mg | RECTAL | Status: AC
  Administered 2018-02-13: 300 mg via RECTAL
  Filled 2018-02-13: qty 1

## 2018-02-13 MED ORDER — PROMETHAZINE HCL 25 MG/ML IJ SOLN
12.5000 mg | Freq: Once | INTRAMUSCULAR | Status: AC
  Administered 2018-02-13: 12.5 mg via INTRAVENOUS
  Filled 2018-02-13: qty 1

## 2018-02-13 MED ORDER — LEVOFLOXACIN IN D5W 500 MG/100ML IV SOLN
500.0000 mg | INTRAVENOUS | Status: DC
  Administered 2018-02-13: 500 mg via INTRAVENOUS
  Filled 2018-02-13: qty 100

## 2018-02-13 MED ORDER — METHYLPREDNISOLONE SODIUM SUCC 125 MG IJ SOLR
60.0000 mg | Freq: Four times a day (QID) | INTRAMUSCULAR | Status: DC
Start: 2018-02-13 — End: 2018-02-14
  Administered 2018-02-13 – 2018-02-14 (×5): 60 mg via INTRAVENOUS
  Filled 2018-02-13 (×3): qty 0.96
  Filled 2018-02-13: qty 2
  Filled 2018-02-13 (×2): qty 0.96
  Filled 2018-02-13: qty 2

## 2018-02-13 MED ORDER — SODIUM CHLORIDE 0.9 % IV SOLN
250.0000 mL | INTRAVENOUS | Status: DC | PRN
  Administered 2018-02-20: 250 mL via INTRAVENOUS

## 2018-02-13 MED ORDER — NOREPINEPHRINE BITARTRATE 1 MG/ML IV SOLN
0.0000 ug/min | Freq: Once | INTRAVENOUS | Status: AC
  Administered 2018-02-13: 25 ug/min via INTRAVENOUS
  Filled 2018-02-13 (×2): qty 4

## 2018-02-13 NOTE — Progress Notes (Signed)
eLink Physician-Brief Progress Note Patient Name: Megan [name] DOB: Jun 14, 1951 MRN: [id number]   Date of Service  02/13/2018  HPI/Events of Note  Severe anxiety inspite of precedex & xanax  eICU Interventions  Use fent for pain - behaviour changes listed -NOT true allergy - not an issue while on vent See effect & then decide if can tolerate more     Intervention Category Major Interventions: Delirium, psychosis, severe agitation - evaluation and management  Sonny Poth V. Prabhjot Maddux 02/13/2018, 8:25 PM

## 2018-02-13 NOTE — Progress Notes (Signed)
Changes per Ramaswammy.

## 2018-02-13 NOTE — Progress Notes (Signed)
Patient transported on ventilator to 1P37 with no complications. Vitals stable.

## 2018-02-13 NOTE — Telephone Encounter (Signed)
Our office did not see the pt on 01/24/18, we saw her on 01/24/17. Attempted to contact Megan [name] at [place] I did not receive an answer. There was no option for me to leave a message. Will try back.

## 2018-02-13 NOTE — Progress Notes (Signed)
This RT attempted aline x2 without success.  Gauze held and taped for more than 5 mins.  Blood was present in catheter but could not thread into artery.

## 2018-02-13 NOTE — Progress Notes (Signed)
Sputum sample obtained and sent to lab by RT. 

## 2018-02-13 NOTE — ED Notes (Signed)
Pt coughing on the vent, shaking head to yes and no questions appropriately. Warm blankets applied

## 2018-02-13 NOTE — Progress Notes (Addendum)
Pharmacy Antibiotic Note  Tokiko Diefenderfer is a 67 y.o. female admitted on 02/12/2018 with pneumonia.  Pharmacy has been consulted for Vancomycin/Levaquin dosing. Pt presents to the ED with shortness of breath. WBC WNL. Renal function OK. Low body weight.   Plan: Vancomycin 1000 mg IV q24h Levaquin 500 mg IV q24h Trend WBC, temp, renal function  F/U infectious work-up Drug levels as indicated  Temp (24hrs), Avg:97.7 F (36.5 C), Min:96 F (35.6 C), Max:99.4 F (37.4 C)  Recent Labs  Lab 02/12/18 2029 02/12/18 2036 02/12/18 2052  WBC  --   --  9.7  CREATININE 0.80  --  0.66  LATICACIDVEN  --  2.11*  --     Estimated Creatinine Clearance: 45.9 mL/min (by C-G formula based on SCr of 0.66 mg/dL).    Allergies  Allergen Reactions  . Fentanyl And Related     Behavioral changes per daughter  . Penicillins Rash    Has patient had a PCN reaction causing immediate rash, facial/tongue/throat swelling, SOB or lightheadedness with hypotension: No Has patient had a PCN reaction causing severe rash involving mucus membranes or skin necrosis:NO Has patient had a PCN reaction that required hospitalization No Has patient had a PCN reaction occurring within the last 10 years:NO If all of the above answers are "NO", then may proceed with Cephalosporin use.     Narda Bonds 02/13/2018 12:50 AM

## 2018-02-13 NOTE — ED Notes (Signed)
Attempted report 

## 2018-02-13 NOTE — ED Notes (Signed)
Pt wide awake on the vent, coughing and gagging frequently; RT at bedside for A-line placement

## 2018-02-13 NOTE — H&P (Signed)
PULMONARY / CRITICAL CARE MEDICINE   Name: Megan [name] MRN: [id number] DOB: 07-03-1951    ADMISSION DATE:  02/12/2018  CHIEF COMPLAINT:  Dyspnea  HISTORY OF PRESENT ILLNESS:   [name] is a 67 y.o f with copd with hx of exacerbation requiring tracheostomy and mrsa positive respiratory cultures in April 2018, essential hypertension, HFpEF (55-60%, increased pa pressure, hypokinesis of midinferolateral and inferior myocardium 08/2015), multiple pulmonary nodules, chf PTSD, who presented with dyspnea. The patient's daughter was at bedside and reported that the patient has not been feeling well over the past few weeks, having decreased appetite, chest tightness, been fatigued, and has been coughing up green colored sputum. On day of admission the patient was noted to have altered mental status. Daughter reports that she took 3 doses of her home xanax.   The patient has been having difficulty with breathing for the past 2 months. She was seen by her pcp in March for copd exacerbation and treated with 5 day course of prednisone 50mg  and doxycycline. She was evaluated by pulmonology (Dr. Elsworth Soho) on 02/07/18 in clinic for 2 month history of chills, fatigue, and chest tightness. During that visit Dr. Elsworth Soho diagnosed her with COPD exacerbation and prescribed her a 12 day steroid taper and 7 day course of doxycycline. The patient's symptoms did not improve with prednisone and doxycycline and she was told by the clinic to increase her oxygen from 2L to 3L.  In the ED the patient was placed on bipap initially, but after patient's mental status worsened and abg noted worsening respiratory acidosis the patient was intubated. Patient afebrile, tachycardic, tachypnic, hypertensive, and hypoxic. Chest xray showed small amount of infiltrate concerning for left lower lobe pneumonia and therefore was started on vancomycin and levofloxacin. While in the ed the patient's pressure dropped and therefore she was started on  levophed after right IJ access obtained.   PAST MEDICAL HISTORY :  She  has a past medical history of Anemia, Anxiety state (08/20/2015), CAP (community acquired pneumonia), CHF (congestive heart failure) ([place]), COPD (chronic obstructive pulmonary disease) ([place]), Depression, Essential hypertension (08/19/2015), GERD (gastroesophageal reflux disease), Headache, History of hiatal hernia, Myocardial infarction ([place]), and Shortness of breath dyspnea.  PAST SURGICAL HISTORY: She  has a past surgical history that includes Cardiac catheterization and Cardiac catheterization (N/A, 08/26/2015).  Allergies  Allergen Reactions  . Fentanyl And Related     Behavioral changes per daughter  . Penicillins Rash    Has patient had a PCN reaction causing immediate rash, facial/tongue/throat swelling, SOB or lightheadedness with hypotension: No Has patient had a PCN reaction causing severe rash involving mucus membranes or skin necrosis:NO Has patient had a PCN reaction that required hospitalization No Has patient had a PCN reaction occurring within the last 10 years:NO If all of the above answers are "NO", then may proceed with Cephalosporin use.    No current facility-administered medications on file prior to encounter.    Current Outpatient Medications on File Prior to Encounter  Medication Sig  . ADVAIR DISKUS 500-50 MCG/DOSE AEPB TAKE 1 INHALATION BY MOUTH TWICE DAILY FOR COPD**RINSE MOUTH WITH WATER AFTER USE** DO NOT SWALLOW  . ALPRAZolam (XANAX) 0.25 MG tablet Take 1 tablet (0.25 mg total) by mouth 2 (two) times daily as needed. for anxiety  . aspirin EC 81 MG tablet Take 81 mg by mouth daily.  . budesonide (PULMICORT) 0.5 MG/2ML nebulizer solution Take 2 mLs (0.5 mg total) by nebulization 2 (two) times daily.  . butalbital-acetaminophen-caffeine (FIORICET,  ESGIC) 50-325-40 MG tablet Take 1 tablet by mouth 2 (two) times daily as needed for headache.  . Butalbital-APAP-Caffeine 50-300-40 MG CAPS TAKE  ONE CAPSULE BY MOUTH EVERY 6 HOURS AS NEEDED FOR HEADACHE  . diltiazem (CARDIZEM CD) 240 MG 24 hr capsule Take 1 capsule (240 mg total) by mouth daily.  Marland Kitchen diltiazem (TIAZAC) 240 MG 24 hr capsule TK ONE C PO ONCE D  . doxycycline (VIBRA-TABS) 100 MG tablet Take 1 tablet (100 mg total) by mouth 2 (two) times daily.  . feeding supplement, ENSURE ENLIVE, (ENSURE ENLIVE) LIQD Take 237 mLs by mouth 4 (four) times daily.  . fluticasone (FLONASE) 50 MCG/ACT nasal spray Place 2 sprays into both nostrils daily.  . INCRUSE ELLIPTA 62.5 MCG/INH AEPB INHALE 1 PUFF INTO THE LUNGS DAILY  . levalbuterol (XOPENEX HFA) 45 MCG/ACT inhaler INHALE 1 TO 2 PUFFS INTO THE LUNGS EVERY 4 HOURS AS NEEDED FOR WHEEZING  . levalbuterol (XOPENEX) 0.63 MG/3ML nebulizer solution Take 3 mLs (0.63 mg total) by nebulization every 4 (four) hours as needed for wheezing.  . levalbuterol (XOPENEX) 1.25 MG/0.5ML nebulizer solution Take 1.25 mg by nebulization every 6 (six) hours.  Marland Kitchen loratadine (CLARITIN) 10 MG tablet Take 1 tablet (10 mg total) daily by mouth.  . metoprolol tartrate (LOPRESSOR) 25 MG tablet Take 0.5 tablets (12.5 mg total) by mouth 2 (two) times daily.  . pantoprazole (PROTONIX) 40 MG tablet TAKE 1 TABLET BY MOUTH EVERY DAY FOR GASTRO-ESOPHAGEAL REFLUX DISEASE  . polyethylene glycol (MIRALAX / GLYCOLAX) packet Take 17 g by mouth 2 (two) times daily.  . predniSONE (DELTASONE) 10 MG tablet 4 tabs x 4days then 3 tabs x4days , 2 tabs for 4 day , 1 tab x 4 days.  Marland Kitchen sertraline (ZOLOFT) 25 MG tablet TAKE 1 TABLET(25 MG) BY MOUTH DAILY  . tiZANidine (ZANAFLEX) 4 MG tablet TAKE 1 TABLET(4 MG) BY MOUTH EVERY 6 HOURS AS NEEDED FOR MUSCLE SPASMS  . XOPENEX HFA 45 MCG/ACT inhaler INHALE 1 TO 2 PUFFS INTO THE LUNGS EVERY 4 HOURS AS NEEDED FOR WHEEZING    FAMILY HISTORY:  Her indicated that her mother is deceased. She indicated that her father is deceased. She indicated that her maternal grandmother is deceased. She indicated that  her maternal grandfather is deceased. She indicated that her paternal grandmother is deceased. She indicated that her paternal grandfather is deceased.   SOCIAL HISTORY: She  reports that she quit smoking about 17 years ago. Her smoking use included cigarettes. She has a 30.00 pack-year smoking history. She has never used smokeless tobacco. She reports that she does not drink alcohol or use drugs.  SUBJECTIVE:  Patient sedated and intubated   VITAL SIGNS: BP (!) 55/45   Pulse (!) 122   Temp (!) 96 F (35.6 C)   Resp 19   SpO2 100%   VENTILATOR SETTINGS: Vent Mode: PRVC FiO2 (%):  [50 %-100 %] 100 % Set Rate:  [18 bmp] 18 bmp Vt Set:  [420 mL] 420 mL PEEP:  [5 cmH20] 5 cmH20 Plateau Pressure:  [31 cmH20] 31 cmH20  INTAKE / OUTPUT: No intake/output data recorded.  PHYSICAL EXAMINATION: Physical Exam  Constitutional: She appears well-developed and well-nourished.  Intubated and sedated  HENT:  Head: Normocephalic and atraumatic.  Right IJ line clean without any surrounding erythema  Eyes: Conjunctivae are normal.  Cardiovascular: Normal rate and regular rhythm.  Respiratory: Breath sounds normal. No respiratory distress. She has no wheezes.  GI: Soft. Bowel sounds are normal.  She exhibits no distension. There is no tenderness.  Musculoskeletal: She exhibits no edema.  Skin:  Cool lower extremities   LABS:  BMET Recent Labs  Lab 02/12/18 2029 02/12/18 2052  NA 132* 135  K 4.6 4.7  CL 88* 90*  CO2  --  35*  BUN 16 12  CREATININE 0.80 0.66  GLUCOSE 136* 151*    Electrolytes Recent Labs  Lab 02/12/18 2052  CALCIUM 8.5*    CBC Recent Labs  Lab 02/12/18 2029 02/12/18 2052  WBC  --  9.7  HGB 15.0 13.5  HCT 44.0 43.6  PLT  --  470*    Coag's No results for input(s): APTT, INR in the last 168 hours.  Sepsis Markers Recent Labs  Lab 02/12/18 2036  LATICACIDVEN 2.11*    ABG No results for input(s): PHART, PCO2ART, PO2ART in the last 168  hours.  Liver Enzymes No results for input(s): AST, ALT, ALKPHOS, BILITOT, ALBUMIN in the last 168 hours.  Cardiac Enzymes No results for input(s): TROPONINI, PROBNP in the last 168 hours.  Glucose Recent Labs  Lab 02/12/18 2137  GLUCAP 139*    Imaging Dg Chest Portable 1 View  Result Date: 02/12/2018 CLINICAL DATA:  Endotracheal tube, central line and orogastric tube placement. EXAM: PORTABLE CHEST 1 VIEW COMPARISON:  Chest radiograph performed earlier today at 8:25 p.m. FINDINGS: The patient's endotracheal tube is seen ending 2 cm above the carina. An enteric tube is noted extending below the diaphragm. A right IJ line is noted ending about the mid SVC. The lungs are hyperexpanded, with flattening of the diaphragms, reflecting emphysema. The lung bases are incompletely characterized on this study. No significant pleural effusion or pneumothorax is seen. The cardiomediastinal silhouette is normal in size. No acute osseous abnormalities are identified. IMPRESSION: 1. Endotracheal tube seen ending 2 cm above the carina. 2. Enteric tube noted extending below the diaphragm. 3. Right IJ line noted ending about the mid SVC. 4. Emphysema.  Lungs otherwise grossly clear. Electronically Signed   By: Garald Balding M.D.   On: 02/12/2018 23:47   Dg Chest Port 1 View  Result Date: 02/12/2018 CLINICAL DATA:  Respiratory distress EXAM: PORTABLE CHEST 1 VIEW COMPARISON:  02/07/2018 FINDINGS: Left lower lobe opacity, new/increased from the prior, suspicious for pneumonia. Right lung is essentially clear, noting hyperinflation with emphysematous changes. Possible small left pleural effusion.  No pneumothorax. The heart is normal in size. IMPRESSION: Left lower lobe opacity, suspicious for pneumonia. Electronically Signed   By: Julian Hy M.D.   On: 02/12/2018 21:08     STUDIES:  Chest xray (02/12/18): left lower lobe infiltrate   CULTURES: Blood culture pending  Respiratory viral panel pending    ANTIBIOTICS: Vancomycin (4/14>present) Levofloxacin (4/14>present)  SIGNIFICANT EVENTS: Intubated 02/12/18  LINES/TUBES: Right IJ line  Right foot periph iv  ASSESSMENT / PLAN:  PULMONARY A: COPD exacerbation Possible left lower lobe pneumonia  Severe respiratory acidosis s/p intubation  P:   Solumedrol 60mg  q6hrs after solumedrol 125mg  given once RVP pending  Duonebs q6hrs pulmicort bid procalcitonin pending precedex gtt Follow abgs  CARDIOVASCULAR A:  HFpEF  P:  BNP normal at 184 Cardiac monitoring Troponin=0.02  RENAL A:   none  GASTROINTESTINAL A:   Prophylaxis  P:   IV pantoprazole  HEMATOLOGIC A:   None  P:   INFECTIOUS A:   Sepsis likely secondary to respiratory source  P:   Blood culture pendin g RVP pending  UA pending  Lactic acid=2.11  2.5L bolus given   ENDOCRINE A:   Workup for adrenal insufficiency  P:   Cortisol  NEUROLOGIC A:   Hx of PTSD  P:   Holding xanax   FAMILY  - Updates: Daughter at bedside updated about intubation and next steps. She stated that she will communicate with patient's son.    Pulmonary and [place] Pager: [phone]  02/13/2018, 12:05 AM

## 2018-02-13 NOTE — Progress Notes (Signed)
[place] Progress Note Patient Name: Eriyah Fernando DOB: [birth year] MRN: [id number]   Date of Service  02/13/2018  HPI/Events of Note  RT caling hihg PiP and PLat - set RR on vent is 24  eICU Interventions  Keep at 8cc Reduce RR from 24 to 12 Check abg in 30-60 min     Intervention Category Major Interventions: Other:  Brand Males 02/13/2018, 1:43 AM

## 2018-02-13 NOTE — Progress Notes (Addendum)
Initial Nutrition Assessment  DOCUMENTATION CODES:   Non-severe (moderate) malnutrition in context of chronic illness, Underweight  INTERVENTION:   Vital AF 1.2 at 45 ml/h (1080 ml per day)  Provides 1296 kcal, 81 gm protein, 876 ml free water daily  NUTRITION DIAGNOSIS:   Moderate Malnutrition related to chronic illness(COPD) as evidenced by mild fat depletion, mild muscle depletion, moderate muscle depletion, severe muscle depletion.  GOAL:   Patient will meet greater than or equal to 90% of their needs  MONITOR:   Vent status, Weight trends, Labs, I & O's, Diet advancement  REASON FOR ASSESSMENT:   Ventilator    ASSESSMENT:   67 yo female with PMH of MI, CHF, COPD, depression, GERD, anemia, HTN, and anxiety who was admitted on 4/14 with SOB and worsening respiratory failure related to LLL PNA, requiring intubation on admission.    Discussed patient in rounds and with RN today. Per discussion with CCM MD, okay to begin TF today as patient will not be extubated today. RD to order TF.  Patient nodded yes when asked if she had lost weight recently. Current weight likely increased due to fluid retention. Unable to complete full nutrition focused physical exam per RN request to limit interaction with patient to prevent increased stimulation and need for sedation.   Patient is currently intubated on ventilator support.   MVE: 5.3 Temp (24hrs), Avg:98.6 F (37 C), Min:96 F (35.6 C), Max:100.4 F (38 C)  Labs reviewed. Sodium 134 (L) CBG's: 203-148 Medications reviewed and include Novolog, Protonix, Precedex.  I/O +3.65 L since admission   Hx of malnutrition, ongoing. Has had trouble in the past taking adequate POs due to difficulty breathing.   NUTRITION - FOCUSED PHYSICAL EXAM:    Most Recent Value  Orbital Region  Mild depletion  Upper Arm Region  No depletion  Thoracic and Lumbar Region  Unable to assess  Buccal Region  Mild depletion  Temple Region  Moderate  depletion  Clavicle Bone Region  Moderate depletion  Clavicle and Acromion Bone Region  Mild depletion  Scapular Bone Region  Unable to assess  Dorsal Hand  Unable to assess  Patellar Region  Severe depletion  Anterior Thigh Region  Severe depletion  Posterior Calf Region  Severe depletion  Edema (RD Assessment)  None  Hair  Reviewed  Eyes  Unable to assess  Mouth  Unable to assess  Skin  Reviewed  Nails  Unable to assess       Diet Order:  Diet NPO time specified  EDUCATION NEEDS:   Not appropriate for education at this time  Skin:  Skin Assessment: Reviewed RN Assessment  Last BM:  unknown  Height:   Ht Readings from Last 1 Encounters:  02/07/18 5\' 3"  (1.6 m)    Weight:   Wt Readings from Last 1 Encounters:  02/13/18 99 lb 6.8 oz (45.1 kg)    Ideal Body Weight:  52.3 kg  BMI:  Body mass index is 17.61 kg/m.  Estimated Nutritional Needs:   Kcal:  1230  Protein:  70-85 gm  Fluid:  1.4-1.5 L    Molli Barrows, RD, LDN, [place] Pager [phone] After Hours Pager [phone]

## 2018-02-13 NOTE — Progress Notes (Signed)
PULMONARY  / CRITICAL CARE MEDICINE  Name: Megan [name] MRN: [id number] DOB: [birth year]    LOS: 0  REFERRING MD :  EDP  CHIEF COMPLAINT:  SOB  BRIEF PATIENT DESCRIPTION: Patient is a 67 y.o female with COPD and a history of MRSA pneumonia requiring tracheostomy placement in April 2018 and HFpEF who presented with progressive dyspnea of 2 months duration. She was initially seen by her PCP in March and treated for an acute COPD exacerbation with prednisone and doxycycline; however, failed to improve. She then presented to her pulmonologist (Dr. Elsworth Soho) on 02/07/18 and again treated for a COPD exacerbation with an extended duration of prednisone and doxycycline. She failed to improve and presented to the ED. In the ED she was found to have worsening acute on chronic hypercarbic respiratory failure with a trial of BiPAP and was subsequently intubated. She was in septic shock requiring pressor support and PCCM was called for further management.   LINES / TUBES: Endotracheal tube 4/14 Right IJ CVC 4/14  Right peripheral foot  CULTURES: 4/14: Blood cultures pending  4/14: Tracheal aspirate pending  4/14: Respiratory Viral Panel pending   ANTIBIOTICS: Vancomycin 4/14 -> Current  Levofloxacin 4/14 -> Current   SIGNIFICANT EVENTS:  March treated for COPD exacerbation with Prednisone and Doxycycline >>> Again treated for COPD exacerbation with Prednisone and Doxyccyline 02/07/18 >>> Acute on Chronic Hypercarbic Respiratory Failure requiring intubation 02/12/18 >>>  INTERVAL HISTORY: No major events overnight.  On and off pressor support overnight.   No family at bedside.   VITAL SIGNS: Temp:  [96 F (35.6 C)-100.4 F (38 C)] 100.4 F (38 C) (04/15 0740) Pulse Rate:  [76-127] 76 (04/15 0218) Resp:  [10-28] 12 (04/15 0218) BP: (55-206)/(45-105) 143/93 (04/15 0218) SpO2:  [87 %-100 %] 100 % (04/15 0737) FiO2 (%):  [50 %-100 %] 60 % (04/15 0737) Weight:  [99 lb 6.8 oz (45.1 kg)] 99 lb 6.8 oz  (45.1 kg) (04/15 0500)  VENTILATOR SETTINGS: Vent Mode: PRVC FiO2 (%):  [50 %-100 %] 60 % Set Rate:  [12 bmp-18 bmp] 12 bmp Vt Set:  [420 mL] 420 mL PEEP:  [5 cmH20-8 cmH20] 8 cmH20 Plateau Pressure:  [22 cmH20-31 cmH20] 22 cmH20  INTAKE / OUTPUT: Intake/Output      04/14 0701 - 04/15 0700 04/15 0701 - 04/16 0700   I.V. (mL/kg) 333.5 (7.4)    NG/GT 40    IV Piggyback 2500    Total Intake(mL/kg) 2873.5 (63.7)    Net +2873.5          PHYSICAL EXAMINATION:  General: Thin elderly female in no acute distress  Neuro: Sedated but arouses to verbal and physical stimuli HEENT: Normocephalic, atraumatic, ET in place Cardiovascular: RRR, no murmurs, no rubs Lungs: Good air movement with minor wheezing throughout  Abdomen: Active bowel sounds, soft, non-distended Musculoskeletal: No LE edema, pulses palpable  Skin: Warm and dry   LABS: Cbc Recent Labs  Lab 02/12/18 2029  02/12/18 2052 02/13/18 0507  WBC  --    < > 9.7 9.3  HGB 15.0  --  13.5 10.3*  HCT 44.0  --  43.6 33.1*  PLT  --   --  470* 311   < > = values in this interval not displayed.   Chemistry Recent Labs  Lab 02/12/18 2029 02/12/18 2052 02/13/18 0507  NA 132* 135 134*  K 4.6 4.7 4.0  CL 88* 90* 94*  CO2  --  35* 33*  BUN 16 12 18  CREATININE 0.80 0.66 1.05*  CALCIUM  --  8.5* 7.6*  GLUCOSE 136* 151* 211*   Liver fxn No results for input(s): AST, ALT, ALKPHOS, BILITOT, PROT, ALBUMIN in the last 168 hours.   coags No results for input(s): APTT, INR in the last 168 hours.   Sepsis markers Recent Labs  Lab 02/12/18 2036 02/13/18 0136  LATICACIDVEN 2.11*  --   PROCALCITON  --  0.50   Cardiac markers No results for input(s): CKTOTAL, CKMB, TROPONINI in the last 168 hours.   BNP No results for input(s): PROBNP in the last 168 hours.   ABG Recent Labs  Lab 02/12/18 2029 02/13/18 0030  PHART  --  7.154*  PCO2ART  --  89.5*  PO2ART  --  280*  HCO3  --  30.0*  TCO2 38*  --    CBG  trend Recent Labs  Lab 02/12/18 2137 02/13/18 0347  GLUCAP 139* 203*   DIAGNOSES: Active Problems:   Sepsis ([place])   Acute respiratory failure with hypoxia and hypercapnia (HCC)  ASSESSMENT / PLAN: Patient is a 67 y.o female with COPD and a history of MRSA pneumonia requiring tracheostomy placement in April 2018 and HFpEF who presented with acute on chronic hypercarbic respiratory failure and septic shock.   PULMONARY A: H/o COPD (No PFTs on file but CT showing severe centrilobular emphysema) H/o of tracheostomy (Placed in April 2018, now removed) Acute on Chronic Hypercarbic Respiratory Failure (HCO3 baseline 32, lives with a pCO2 around 60-65) LLL Pneumonia   P:   Follow cultures and RVP Continue Vancomycin and Levofloxacin Continue Solumedrol 60 mg q6 hours  Continue scheduled breathing treatments (pulmicort and duonebs) VAP precautions  Current sedation: Precedex   RASS goal 0 Repeat ABG pending. Will adjust vent based on results.   INFECTIOUS A: LLL Pneumonia  Procal 0.50   P:   Trend fever curve and WBC Continue Vancomycin and Levaquin for now  CARDIOVASCULAR A: H/o HFpEF (2016; LVEF 55-60% with hypokinesis of the midinferolateral and inferior myocardium, elevated PA pressures) H/o NSTEMI (2016; left heart cath without significant CAD) Shock   P:  On norepinephrine 4 mcg. Wean to maintain MAP >65   RENAL A:  Acute Kidney Injury   P:   Likely pre-renal in setting of sepsis  Monitor urine output. No urine output since arriving to the floor. Bladder scan showing 325 mL. Will give 1L LR Trend creatinine   GASTROINTESTINAL A: Nutrition  GI ppx  P:   Will hold off on tube feeds Continue GI ppx   HEMATOLOGIC A: Iron Deficiency Anemia   P:  Monitor CBC  ENDOCRINE A: Normal AM cortisol (Cortisol 4.2 at 01:37) Hyperglycemia 2/2 steroids  P:   Would not start stress dose steroids.  SSI-sensitive while receiving high dose steroids.  NEUROLOGIC A:   Sedated   P:   RASS: -2 Current sedation: Precedex   RASS goal 0 Wean sedation.  Pulmonary and [place] Pager: [phone]  02/13/2018, 7:40 AM

## 2018-02-14 ENCOUNTER — Inpatient Hospital Stay (HOSPITAL_COMMUNITY): Payer: Medicare Other

## 2018-02-14 LAB — BASIC METABOLIC PANEL
ANION GAP: 8 (ref 5–15)
BUN: 21 mg/dL — ABNORMAL HIGH (ref 6–20)
CHLORIDE: 98 mmol/L — AB (ref 101–111)
CO2: 33 mmol/L — ABNORMAL HIGH (ref 22–32)
Calcium: 8.4 mg/dL — ABNORMAL LOW (ref 8.9–10.3)
Creatinine, Ser: 0.92 mg/dL (ref 0.44–1.00)
GFR calc non Af Amer: >60 mL/min (ref >60)
Glucose, Bld: 157 mg/dL — ABNORMAL HIGH (ref 65–99)
POTASSIUM: 3.9 mmol/L (ref 3.5–5.1)
Sodium: 139 mmol/L (ref 135–145)

## 2018-02-14 LAB — GLUCOSE, CAPILLARY
GLUCOSE-CAPILLARY: 102 mg/dL — AB (ref 65–99)
GLUCOSE-CAPILLARY: 121 mg/dL — AB (ref 65–99)
GLUCOSE-CAPILLARY: 145 mg/dL — AB (ref 65–99)
GLUCOSE-CAPILLARY: 151 mg/dL — AB (ref 65–99)
Glucose-Capillary: 104 mg/dL — ABNORMAL HIGH (ref 65–99)
Glucose-Capillary: 210 mg/dL — ABNORMAL HIGH (ref 65–99)
Glucose-Capillary: 67 mg/dL (ref 65–99)

## 2018-02-14 LAB — MAGNESIUM: Magnesium: 1.8 mg/dL (ref 1.7–2.4)

## 2018-02-14 LAB — CBC
HEMATOCRIT: 32.9 % — AB (ref 36.0–46.0)
Hemoglobin: 10.4 g/dL — ABNORMAL LOW (ref 12.0–15.0)
MCH: 30.4 pg (ref 26.0–34.0)
MCHC: 31.6 g/dL (ref 30.0–36.0)
MCV: 96.2 fL (ref 78.0–100.0)
PLATELETS: 262 10*3/uL (ref 150–400)
RBC: 3.42 MIL/uL — AB (ref 3.87–5.11)
RDW: 12.7 % (ref 11.5–15.5)
WBC: 7.2 10*3/uL (ref 4.0–10.5)

## 2018-02-14 LAB — PHOSPHORUS: Phosphorus: 3 mg/dL (ref 2.5–4.6)

## 2018-02-14 MED ORDER — METOPROLOL TARTRATE 25 MG/10 ML ORAL SUSPENSION
12.5000 mg | Freq: Two times a day (BID) | ORAL | Status: DC
  Administered 2018-02-14 (×2): 12.5 mg
  Filled 2018-02-14 (×3): qty 5

## 2018-02-14 MED ORDER — CLONAZEPAM 0.25 MG PO TBDP
0.2500 mg | ORAL_TABLET | Freq: Two times a day (BID) | ORAL | Status: DC
  Administered 2018-02-14 (×2): 0.25 mg via ORAL
  Filled 2018-02-14 (×2): qty 1

## 2018-02-14 MED ORDER — ORAL CARE MOUTH RINSE
15.0000 mL | Freq: Four times a day (QID) | OROMUCOSAL | Status: DC
  Administered 2018-02-14 (×3): 15 mL via OROMUCOSAL

## 2018-02-14 MED ORDER — CHLORHEXIDINE GLUCONATE CLOTH 2 % EX PADS
6.0000 | MEDICATED_PAD | Freq: Every day | CUTANEOUS | Status: DC
  Administered 2018-02-15 – 2018-02-21 (×7): 6 via TOPICAL

## 2018-02-14 MED ORDER — FENTANYL CITRATE (PF) 100 MCG/2ML IJ SOLN
25.0000 ug | Freq: Once | INTRAMUSCULAR | Status: AC
  Administered 2018-02-14: 100 ug via INTRAVENOUS
  Filled 2018-02-14: qty 2

## 2018-02-14 MED ORDER — METHYLPREDNISOLONE SODIUM SUCC 125 MG IJ SOLR
60.0000 mg | Freq: Three times a day (TID) | INTRAMUSCULAR | Status: DC
  Administered 2018-02-14 (×2): 60 mg via INTRAVENOUS
  Filled 2018-02-14 (×3): qty 0.96

## 2018-02-14 MED ORDER — SODIUM CHLORIDE 0.9% FLUSH: 10.0000 mL | INTRAVENOUS | Status: DC | PRN

## 2018-02-14 MED ORDER — DEXMEDETOMIDINE HCL IN NACL 400 MCG/100ML IV SOLN
0.4000 ug/kg/h | INTRAVENOUS | Status: DC
  Administered 2018-02-14: 1.2 ug/kg/h via INTRAVENOUS
  Administered 2018-02-15: 0.4 ug/kg/h via INTRAVENOUS
  Administered 2018-02-15: 0.5 ug/kg/h via INTRAVENOUS
  Administered 2018-02-16: 0.6 ug/kg/h via INTRAVENOUS
  Administered 2018-02-16: 1 ug/kg/h via INTRAVENOUS
  Administered 2018-02-17: 0.6 ug/kg/h via INTRAVENOUS
  Administered 2018-02-18: 0.8 ug/kg/h via INTRAVENOUS
  Administered 2018-02-18: 0.7 ug/kg/h via INTRAVENOUS
  Administered 2018-02-19: 0.5 ug/kg/h via INTRAVENOUS
  Administered 2018-02-20 – 2018-02-21 (×3): 0.6 ug/kg/h via INTRAVENOUS
  Filled 2018-02-14 (×11): qty 100

## 2018-02-14 MED ORDER — ALPRAZOLAM 0.5 MG PO TABS
0.5000 mg | ORAL_TABLET | Freq: Two times a day (BID) | ORAL | Status: DC | PRN
  Administered 2018-02-14: 0.5 mg
  Filled 2018-02-14: qty 1

## 2018-02-14 MED ORDER — LORAZEPAM 2 MG/ML IJ SOLN
0.5000 mg | Freq: Once | INTRAMUSCULAR | Status: AC
  Administered 2018-02-15: 0.5 mg via INTRAVENOUS
  Filled 2018-02-14: qty 1

## 2018-02-14 MED ORDER — CLONAZEPAM 0.5 MG PO TABS
0.2500 mg | ORAL_TABLET | Freq: Two times a day (BID) | ORAL | Status: DC
  Filled 2018-02-14: qty 1

## 2018-02-14 MED ORDER — ACETAMINOPHEN 325 MG PO TABS
650.0000 mg | ORAL_TABLET | Freq: Four times a day (QID) | ORAL | Status: DC | PRN
  Administered 2018-02-14: 650 mg via ORAL
  Filled 2018-02-14: qty 2

## 2018-02-14 MED ORDER — CLONAZEPAM 0.1 MG/ML ORAL SUSPENSION: 0.2500 mg | Freq: Two times a day (BID) | ORAL | Status: DC

## 2018-02-14 MED ORDER — AZITHROMYCIN 250 MG PO TABS
250.0000 mg | ORAL_TABLET | Freq: Every day | ORAL | Status: AC
  Administered 2018-02-14 – 2018-02-16 (×3): 250 mg
  Filled 2018-02-14 (×3): qty 1

## 2018-02-14 MED ORDER — PHENOL 1.4 % MT LIQD
1.0000 | OROMUCOSAL | Status: DC | PRN
  Administered 2018-02-14 – 2018-02-19 (×6): 1 via OROMUCOSAL
  Filled 2018-02-14: qty 177

## 2018-02-14 MED ORDER — FENTANYL CITRATE (PF) 100 MCG/2ML IJ SOLN: 50.0000 ug | INTRAMUSCULAR | Status: DC | PRN

## 2018-02-14 MED ORDER — CHLORHEXIDINE GLUCONATE 0.12% ORAL RINSE (MEDLINE KIT)
15.0000 mL | Freq: Two times a day (BID) | OROMUCOSAL | Status: DC
  Administered 2018-02-14 (×2): 15 mL via OROMUCOSAL

## 2018-02-14 MED ORDER — SODIUM CHLORIDE 0.9% FLUSH
10.0000 mL | Freq: Two times a day (BID) | INTRAVENOUS | Status: DC
  Administered 2018-02-15 – 2018-02-21 (×9): 10 mL

## 2018-02-14 MED ORDER — ACETAMINOPHEN 500 MG PO TABS
1000.0000 mg | ORAL_TABLET | Freq: Four times a day (QID) | ORAL | Status: DC | PRN
  Administered 2018-02-14: 1000 mg via ORAL
  Filled 2018-02-14: qty 2

## 2018-02-14 MED ORDER — METOPROLOL TARTRATE 12.5 MG HALF TABLET: 12.5000 mg | ORAL_TABLET | Freq: Two times a day (BID) | ORAL | Status: DC

## 2018-02-14 MED ORDER — ORAL CARE MOUTH RINSE
15.0000 mL | Freq: Two times a day (BID) | OROMUCOSAL | Status: DC
  Administered 2018-02-14 – 2018-02-15 (×3): 15 mL via OROMUCOSAL

## 2018-02-14 MED ORDER — ALPRAZOLAM 0.5 MG PO TABS
0.5000 mg | ORAL_TABLET | Freq: Two times a day (BID) | ORAL | Status: DC | PRN
  Administered 2018-02-14: 0.5 mg via ORAL
  Filled 2018-02-14: qty 1

## 2018-02-14 NOTE — Telephone Encounter (Signed)
Called and spoke with Fraser Din who stated she was needing the OV notes from pt's visit 01/24/17.  She also stated a recert form was sent to our office yesterday, 02/13/18 via fax.  Will fax pt's OV notes to [facility] with [employer].  Rodena Piety, please advise if you received a recert form from [place] on pt. Thanks!

## 2018-02-14 NOTE — Telephone Encounter (Signed)
I just called and spoke with Fraser Din from [place] and she states that she has the form and office note and now has everything she needs to bill the insurance

## 2018-02-14 NOTE — Procedures (Signed)
Extubation Procedure Note  Patient Details:   Name: Marrianne Sica DOB: [birth year] MRN: [id number]   Airway Documentation:  Airway 7.5 mm (Active)  Secured at (cm) 23 cm 02/14/2018 11:57 AM  Measured From Lips 02/14/2018 11:57 AM  Secured Location Left 02/14/2018 11:57 AM  Secured By [employer] 02/14/2018 11:57 AM  Tube Holder Repositioned Yes 02/14/2018 11:57 AM  Cuff Pressure (cm H2O) 28 cm H2O 02/14/2018  8:47 AM  Site Condition Dry 02/14/2018  8:47 AM    Evaluation  O2 sats: stable throughout Complications: No apparent complications Patient did tolerate procedure well. Bilateral Breath Sounds: Clear, Diminished   Yes  Extubated patient to 3L nasal canula. Patient oriented to time and place.   Dimple Nanas 02/14/2018, 2:39 PM

## 2018-02-14 NOTE — Progress Notes (Signed)
PULMONARY  / CRITICAL CARE MEDICINE  Name: Megan [name] MRN: [id number] DOB: 1951-07-29    LOS: 7  REFERRING MD :  EDP  CHIEF COMPLAINT:  SOB  BRIEF PATIENT DESCRIPTION: Patient is a 67 y.o female with COPD and a history of MRSA pneumonia requiring tracheostomy placement in April 2018 and HFpEF who presented with progressive dyspnea of 2 months duration. She was initially seen by her PCP in March and treated for an acute COPD exacerbation with prednisone and doxycycline; however, failed to improve. She then presented to her pulmonologist (Dr. Elsworth Soho) on 02/07/18 and again treated for a COPD exacerbation with an extended duration of prednisone and doxycycline. She failed to improve and presented to the ED. In the ED she was found to have worsening acute on chronic hypercarbic respiratory failure with a trial of BiPAP and was subsequently intubated. She was in septic shock requiring pressor support and PCCM was called for further management.   LINES / TUBES: Endotracheal tube 4/14 Right IJ CVC 4/14  Right peripheral foot  CULTURES: 4/14: Blood cultures with no growth at 24 hours 4/14: Tracheal aspirate pending  4/14: Respiratory Viral Panel positive for metapneumovirus   ANTIBIOTICS: Vancomycin 4/14 -> Discontinued on 4/15  Levofloxacin 4/14 -> Current  SIGNIFICANT EVENTS:  March treated for COPD exacerbation with Prednisone and Doxycycline >>> Again treated for COPD exacerbation with Prednisone and Doxyccyline 02/07/18 >>> Acute on Chronic Hypercarbic Respiratory Failure requiring intubation 02/12/18 >>>  INTERVAL HISTORY:  Patient is very anxious this AM. Writing that she is tired, in pain, and feeling sick. She would like something to help her sleep and additional pain/anxiety medications. We discussed that we are going to try to wean her from the ventilator today and will work on controlling her pain medication.   Very anxious overnight.  Fentanyl added for increased agitation overnight.   Afebrile overnight.   No Family at bedside.   VITAL SIGNS: Temp:  [97.6 F (36.4 C)-100.4 F (38 C)] 97.8 F (36.6 C) (04/16 0332) Pulse Rate:  [48-136] 126 (04/16 0600) Resp:  [12-28] 19 (04/16 0600) BP: (102-165)/(67-132) 120/87 (04/16 0600) SpO2:  [97 %-100 %] 100 % (04/16 0600) FiO2 (%):  [40 %-60 %] 40 % (04/16 0400) Weight:  [100 lb 15.5 oz (45.8 kg)] 100 lb 15.5 oz (45.8 kg) (04/16 0400)  VENTILATOR SETTINGS: Vent Mode: PRVC FiO2 (%):  [40 %-60 %] 40 % Set Rate:  [12 bmp] 12 bmp Vt Set:  [420 mL] 420 mL PEEP:  [5 cmH20] 5 cmH20 Pressure Support:  [5 cmH20] 5 cmH20 Plateau Pressure:  [18 cmH20-20 cmH20] 20 cmH20  INTAKE / OUTPUT: Intake/Output      04/15 0701 - 04/16 0700 04/16 0701 - 04/17 0700   P.O. 0    I.V. (mL/kg) 411.6 (9)    NG/GT 702.5    IV Piggyback 1099    Total Intake(mL/kg) 2213.1 (48.3)    Urine (mL/kg/hr) 1550 (1.4)    Total Output 1550    Net +663.1          PHYSICAL EXAMINATION:  General: Thin elderly female, anxious Neuro: Alert and oriented, writing on her paper.  HEENT: Normocephalic, atraumatic, ET in place Cardiovascular: RRR, no murmurs, no rubs Lungs: Good air movement with minor wheezing throughout  Abdomen: Active bowel sounds, soft, non-distended Musculoskeletal: No LE edema, pulses palpable  Skin: Warm and dry   LABS: Cbc Recent Labs  Lab 02/12/18 2052 02/13/18 0507 02/14/18 0410  WBC 9.7 9.3 7.2  HGB  13.5 10.3* 10.4*  HCT 43.6 33.1* 32.9*  PLT 470* 311 262   Chemistry Recent Labs  Lab 02/12/18 2052 02/13/18 0507 02/13/18 1324 02/14/18 0410  NA 135 134*  --  139  K 4.7 4.0  --  3.9  CL 90* 94*  --  98*  CO2 35* 33*  --  33*  BUN 12 18  --  21*  CREATININE 0.66 1.05*  --  0.92  CALCIUM 8.5* 7.6*  --  8.4*  MG  --   --  1.6* 1.8  PHOS  --   --  3.0 3.0  GLUCOSE 151* 211*  --  157*   Liver fxn No results for input(s): AST, ALT, ALKPHOS, BILITOT, PROT, ALBUMIN in the last 168 hours.   coags No results  for input(s): APTT, INR in the last 168 hours.   Sepsis markers Recent Labs  Lab 02/12/18 2036 02/13/18 0136 02/13/18 1349  LATICACIDVEN 2.11*  --  0.9  PROCALCITON  --  0.50  --    Cardiac markers No results for input(s): CKTOTAL, CKMB, TROPONINI in the last 168 hours.   BNP No results for input(s): PROBNP in the last 168 hours.   ABG Recent Labs  Lab 02/12/18 2029 02/13/18 0030 02/13/18 1131  PHART  --  7.154* 7.333*  PCO2ART  --  89.5* 67.8*  PO2ART  --  280* 90.0  HCO3  --  30.0* 36.0*  TCO2 38*  --  38*   CBG trend Recent Labs  Lab 02/13/18 1112 02/13/18 1537 02/13/18 2000 02/13/18 2359 02/14/18 0330  GLUCAP 156* 159* 194* 151* 104*   DIAGNOSES: Active Problems:   Sepsis (HCC)   Acute respiratory failure with hypoxia and hypercapnia (HCC)   Malnutrition of moderate degree  ASSESSMENT / PLAN: Patient is a 67 y.o female with COPD and a history of MRSA pneumonia requiring tracheostomy placement in April 2018 and HFpEF who presented with acute on chronic hypercarbic respiratory failure and septic shock.   PULMONARY A: H/o COPD (No PFTs on file but CT showing severe centrilobular emphysema) H/o of tracheostomy (Placed in April 2018, now removed) Acute on Chronic Hypercarbic Respiratory Failure (HCO3 baseline 32, lives with a pCO2 around 60-65) Acute COPD exacerbation 2/2 Metapneumonvirus   P:   Continue to follow cultures Discontinue levofloxacin today  Continue Solumedrol 60 mg q6 hours, transition to q12 hours. Continue scheduled breathing treatments (pulmicort and duonebs) VAP precautions  Current sedation: Precedex and fentanyl  RASS goal 0  INFECTIOUS A: Acute COPD exacerbation 2/2 Metapneumonvirus   P:   Trend fever curve and WBC Discontinue antibiotics   CARDIOVASCULAR A: H/o HFpEF (2016; LVEF 55-60% with hypokinesis of the midinferolateral and inferior myocardium, elevated PA pressures) H/o NSTEMI (2016; left heart cath without  significant CAD) Shock   P:  Off pressor support.  Restart metoprolol 12.5 mg BID  RENAL A:  Acute Kidney Injury  Likely pre-renal in setting of sepsis   P:   Creatinine improved this AM. Continue to trend  Monitor urine output.   GASTROINTESTINAL A: Nutrition  GI ppx  P:   Stop tube feeds if able to extubate today.  Continue GI ppx   HEMATOLOGIC A: Iron Deficiency Anemia   P:  Monitor CBC  ENDOCRINE A: Normal AM cortisol (Cortisol 4.2 at 01:37) Hyperglycemia 2/2 steroids  P:   SSI-sensitive while receiving high dose steroids.  NEUROLOGIC A:  Sedated  Anxiety   P:   RASS: -2 Current sedation: Precedex and  fentanyl  RASS goal 0 Wean sedation. Ativan 05. Mg BID PRN Restart Sertraline 25 mg QD  Pulmonary and Critical Care Medicine [facility] Pager: [phone]  02/14/2018, 7:15 AM

## 2018-02-14 NOTE — Telephone Encounter (Signed)
Pat from [place] is calling back [phone]

## 2018-02-15 ENCOUNTER — Other Ambulatory Visit: Payer: Self-pay

## 2018-02-15 ENCOUNTER — Inpatient Hospital Stay (HOSPITAL_COMMUNITY): Payer: Medicare Other

## 2018-02-15 DIAGNOSIS — E440 Moderate protein-calorie malnutrition: Secondary | ICD-10-CM

## 2018-02-15 LAB — BLOOD GAS, ARTERIAL
ACID-BASE EXCESS: 15.5 mmol/L — AB (ref 0.0–2.0)
ACID-BASE EXCESS: 15.4 mmol/L — AB (ref 0.0–2.0)
Bicarbonate: 43.2 mmol/L — ABNORMAL HIGH (ref 20.0–28.0)
Bicarbonate: 43 mmol/L — ABNORMAL HIGH (ref 20.0–28.0)
DRAWN BY: 23703
DRAWN BY: 518061
FIO2: 55
FIO2: 52
O2 Content: 8 L/min
O2 SAT: 89.5 %
O2 Saturation: 99.1 %
PCO2 ART: 105 mmHg — AB (ref 32.0–48.0)
PCO2 ART: 102 mmHg — AB (ref 32.0–48.0)
PH ART: 7.25 — AB (ref 7.350–7.450)
Patient temperature: 98.6
Patient temperature: 98.6
pH, Arterial: 7.24 — ABNORMAL LOW (ref 7.350–7.450)
pO2, Arterial: 65.3 mmHg — ABNORMAL LOW (ref 83.0–108.0)
pO2, Arterial: 275 mmHg — ABNORMAL HIGH (ref 83.0–108.0)

## 2018-02-15 LAB — CULTURE, RESPIRATORY: CULTURE: NORMAL

## 2018-02-15 LAB — GLUCOSE, CAPILLARY
GLUCOSE-CAPILLARY: 81 mg/dL (ref 65–99)
GLUCOSE-CAPILLARY: 84 mg/dL (ref 65–99)
GLUCOSE-CAPILLARY: 104 mg/dL — AB (ref 65–99)
Glucose-Capillary: 127 mg/dL — ABNORMAL HIGH (ref 65–99)
Glucose-Capillary: 128 mg/dL — ABNORMAL HIGH (ref 65–99)
Glucose-Capillary: 130 mg/dL — ABNORMAL HIGH (ref 65–99)
Glucose-Capillary: 175 mg/dL — ABNORMAL HIGH (ref 65–99)

## 2018-02-15 LAB — BASIC METABOLIC PANEL
ANION GAP: 8 (ref 5–15)
BUN: 20 mg/dL (ref 6–20)
CO2: 40 mmol/L — AB (ref 22–32)
Calcium: 8.4 mg/dL — ABNORMAL LOW (ref 8.9–10.3)
Chloride: 94 mmol/L — ABNORMAL LOW (ref 101–111)
Creatinine, Ser: 0.77 mg/dL (ref 0.44–1.00)
GFR calc Af Amer: >60 mL/min (ref >60)
GFR calc non Af Amer: >60 mL/min (ref >60)
GLUCOSE: 137 mg/dL — AB (ref 65–99)
POTASSIUM: 4.4 mmol/L (ref 3.5–5.1)
Sodium: 142 mmol/L (ref 135–145)

## 2018-02-15 LAB — POCT I-STAT 3, ART BLOOD GAS (G3+)
ACID-BASE EXCESS: 14 mmol/L — AB (ref 0.0–2.0)
Bicarbonate: 42 mmol/L — ABNORMAL HIGH (ref 20.0–28.0)
O2 SAT: 100 %
PCO2 ART: 73.1 mmHg — AB (ref 32.0–48.0)
TCO2: 44 mmol/L — ABNORMAL HIGH (ref 22–32)
pH, Arterial: 7.368 (ref 7.350–7.450)
pO2, Arterial: 234 mmHg — ABNORMAL HIGH (ref 83.0–108.0)

## 2018-02-15 LAB — CBC
HEMATOCRIT: 37 % (ref 36.0–46.0)
Hemoglobin: 11.2 g/dL — ABNORMAL LOW (ref 12.0–15.0)
MCH: 30.4 pg (ref 26.0–34.0)
MCHC: 30.3 g/dL (ref 30.0–36.0)
MCV: 100.3 fL — AB (ref 78.0–100.0)
Platelets: 397 10*3/uL (ref 150–400)
RBC: 3.69 MIL/uL — AB (ref 3.87–5.11)
RDW: 13.3 % (ref 11.5–15.5)
WBC: 13.9 10*3/uL — AB (ref 4.0–10.5)

## 2018-02-15 LAB — CULTURE, RESPIRATORY W GRAM STAIN

## 2018-02-15 MED ORDER — FENTANYL CITRATE (PF) 100 MCG/2ML IJ SOLN
12.5000 ug | INTRAMUSCULAR | Status: DC | PRN
  Administered 2018-02-16 – 2018-02-17 (×5): 12.5 ug via INTRAVENOUS
  Filled 2018-02-15 (×5): qty 2

## 2018-02-15 MED ORDER — VITAL AF 1.2 CAL PO LIQD
1000.0000 mL | ORAL | Status: DC
  Administered 2018-02-15 – 2018-02-20 (×5): 1000 mL
  Filled 2018-02-15 (×4): qty 1000

## 2018-02-15 MED ORDER — DILTIAZEM HCL ER COATED BEADS 240 MG PO CP24
240.0000 mg | ORAL_CAPSULE | Freq: Every day | ORAL | Status: DC
  Filled 2018-02-15: qty 1

## 2018-02-15 MED ORDER — ORAL CARE MOUTH RINSE
15.0000 mL | OROMUCOSAL | Status: DC
  Administered 2018-02-16 – 2018-02-21 (×47): 15 mL via OROMUCOSAL

## 2018-02-15 MED ORDER — FENTANYL CITRATE (PF) 100 MCG/2ML IJ SOLN
INTRAMUSCULAR | Status: AC
  Administered 2018-02-15: 50 ug
  Filled 2018-02-15: qty 2

## 2018-02-15 MED ORDER — PRO-STAT SUGAR FREE PO LIQD
30.0000 mL | Freq: Two times a day (BID) | ORAL | Status: DC
  Filled 2018-02-15 (×2): qty 30

## 2018-02-15 MED ORDER — ALBUTEROL (5 MG/ML) CONTINUOUS INHALATION SOLN
10.0000 mg/h | INHALATION_SOLUTION | RESPIRATORY_TRACT | Status: DC
  Administered 2018-02-15: 10 mg/h via RESPIRATORY_TRACT
  Filled 2018-02-15: qty 20

## 2018-02-15 MED ORDER — DOBUTAMINE IN D5W 4-5 MG/ML-% IV SOLN
2.5000 ug/kg/min | INTRAVENOUS | Status: DC
  Filled 2018-02-15: qty 250

## 2018-02-15 MED ORDER — ETOMIDATE 2 MG/ML IV SOLN
10.0000 mg | Freq: Once | INTRAVENOUS | Status: AC
  Administered 2018-02-15: 10 mg via INTRAVENOUS

## 2018-02-15 MED ORDER — ORAL CARE MOUTH RINSE: 15.0000 mL | Freq: Four times a day (QID) | OROMUCOSAL | Status: DC

## 2018-02-15 MED ORDER — ALBUTEROL SULFATE (2.5 MG/3ML) 0.083% IN NEBU
INHALATION_SOLUTION | RESPIRATORY_TRACT | Status: AC
  Filled 2018-02-15: qty 12

## 2018-02-15 MED ORDER — DEXTROSE 50 % IV SOLN
25.0000 mL | Freq: Once | INTRAVENOUS | Status: AC
  Administered 2018-02-15: 25 mL via INTRAVENOUS

## 2018-02-15 MED ORDER — HYDRALAZINE HCL 20 MG/ML IJ SOLN
10.0000 mg | INTRAMUSCULAR | Status: DC | PRN
  Administered 2018-02-15: 10 mg via INTRAVENOUS
  Filled 2018-02-15: qty 1

## 2018-02-15 MED ORDER — METOPROLOL TARTRATE 12.5 MG HALF TABLET: 12.5000 mg | ORAL_TABLET | Freq: Two times a day (BID) | ORAL | Status: DC

## 2018-02-15 MED ORDER — PREDNISONE 20 MG PO TABS
40.0000 mg | ORAL_TABLET | Freq: Every day | ORAL | Status: DC
  Filled 2018-02-15: qty 2

## 2018-02-15 MED ORDER — METOPROLOL TARTRATE 5 MG/5ML IV SOLN
5.0000 mg | Freq: Four times a day (QID) | INTRAVENOUS | Status: DC
  Administered 2018-02-15 – 2018-02-18 (×14): 5 mg via INTRAVENOUS
  Filled 2018-02-15 (×14): qty 5

## 2018-02-15 MED ORDER — LORAZEPAM 2 MG/ML IJ SOLN
1.0000 mg | Freq: Once | INTRAMUSCULAR | Status: AC
  Administered 2018-02-15: 1 mg via INTRAVENOUS
  Filled 2018-02-15: qty 1

## 2018-02-15 MED ORDER — WHITE PETROLATUM EX OINT
TOPICAL_OINTMENT | CUTANEOUS | Status: AC
  Administered 2018-02-15: 1
  Filled 2018-02-15: qty 28.35

## 2018-02-15 MED ORDER — FAMOTIDINE IN NACL 20-0.9 MG/50ML-% IV SOLN
20.0000 mg | Freq: Two times a day (BID) | INTRAVENOUS | Status: DC
  Administered 2018-02-15 – 2018-02-16 (×2): 20 mg via INTRAVENOUS
  Filled 2018-02-15 (×2): qty 50

## 2018-02-15 MED ORDER — DEXTROSE 50 % IV SOLN
INTRAVENOUS | Status: AC
  Administered 2018-02-15: 25 mL via INTRAVENOUS
  Filled 2018-02-15: qty 50

## 2018-02-15 MED ORDER — MIDAZOLAM HCL 2 MG/2ML IJ SOLN: 1.0000 mg | Freq: Once | INTRAMUSCULAR | Status: DC

## 2018-02-15 MED ORDER — METHYLPREDNISOLONE SODIUM SUCC 40 MG IJ SOLR
40.0000 mg | Freq: Two times a day (BID) | INTRAMUSCULAR | Status: DC
  Administered 2018-02-15 – 2018-02-18 (×7): 40 mg via INTRAVENOUS
  Filled 2018-02-15 (×7): qty 1

## 2018-02-15 MED ORDER — CHLORHEXIDINE GLUCONATE 0.12% ORAL RINSE (MEDLINE KIT)
15.0000 mL | Freq: Two times a day (BID) | OROMUCOSAL | Status: DC
  Administered 2018-02-15: 15 mL via OROMUCOSAL

## 2018-02-15 MED ORDER — CLONAZEPAM 0.25 MG PO TBDP
0.2500 mg | ORAL_TABLET | Freq: Two times a day (BID) | ORAL | Status: DC
  Administered 2018-02-15 – 2018-02-16 (×3): 0.25 mg
  Filled 2018-02-15 (×3): qty 1

## 2018-02-15 MED ORDER — VITAL HIGH PROTEIN PO LIQD: 1000.0000 mL | ORAL | Status: DC

## 2018-02-15 MED ORDER — FENTANYL CITRATE (PF) 100 MCG/2ML IJ SOLN: 50.0000 ug | Freq: Once | INTRAMUSCULAR | Status: DC

## 2018-02-15 MED ORDER — CHLORHEXIDINE GLUCONATE 0.12% ORAL RINSE (MEDLINE KIT)
15.0000 mL | Freq: Two times a day (BID) | OROMUCOSAL | Status: DC
  Administered 2018-02-16 – 2018-02-21 (×11): 15 mL via OROMUCOSAL

## 2018-02-15 MED ORDER — METOPROLOL TARTRATE 5 MG/5ML IV SOLN: 2.5000 mg | Freq: Four times a day (QID) | INTRAVENOUS | Status: DC

## 2018-02-15 MED ORDER — LEVALBUTEROL HCL 0.63 MG/3ML IN NEBU
0.6300 mg | INHALATION_SOLUTION | Freq: Once | RESPIRATORY_TRACT | Status: AC
  Administered 2018-02-15: 0.63 mg via RESPIRATORY_TRACT
  Filled 2018-02-15: qty 3

## 2018-02-15 MED ORDER — MIDAZOLAM HCL 2 MG/2ML IJ SOLN
INTRAMUSCULAR | Status: AC
  Administered 2018-02-15: 1 mg
  Filled 2018-02-15: qty 2

## 2018-02-15 MED ORDER — SERTRALINE HCL 25 MG PO TABS
25.0000 mg | ORAL_TABLET | Freq: Every day | ORAL | Status: DC
  Administered 2018-02-16 – 2018-02-21 (×6): 25 mg
  Filled 2018-02-15 (×6): qty 1

## 2018-02-15 MED ORDER — BUDESONIDE 0.5 MG/2ML IN SUSP
0.5000 mg | Freq: Two times a day (BID) | RESPIRATORY_TRACT | Status: DC
  Administered 2018-02-15 – 2018-02-27 (×24): 0.5 mg via RESPIRATORY_TRACT
  Filled 2018-02-15 (×25): qty 2

## 2018-02-15 MED ORDER — ACETAMINOPHEN 325 MG PO TABS
650.0000 mg | ORAL_TABLET | Freq: Four times a day (QID) | ORAL | Status: DC | PRN
  Administered 2018-02-16 – 2018-02-17 (×2): 650 mg
  Filled 2018-02-15 (×2): qty 2

## 2018-02-15 MED ORDER — SERTRALINE HCL 25 MG PO TABS
25.0000 mg | ORAL_TABLET | Freq: Every day | ORAL | Status: DC
  Filled 2018-02-15: qty 1

## 2018-02-15 MED ORDER — METOPROLOL TARTRATE 5 MG/5ML IV SOLN
2.5000 mg | Freq: Four times a day (QID) | INTRAVENOUS | Status: DC
  Administered 2018-02-15: 2.5 mg via INTRAVENOUS
  Filled 2018-02-15 (×2): qty 5

## 2018-02-15 NOTE — Progress Notes (Signed)
Pt placed back on Bipap at this time per MD due to ABG results, pt tolerating well at this time, RT will monitor

## 2018-02-15 NOTE — Progress Notes (Signed)
Placed patient on BIPAP per ABG results. 12/6 R-16 50%.  MD at bedside.  Patient awake, alert, responding, talking, following commands.

## 2018-02-15 NOTE — Progress Notes (Addendum)
Paged by nurse stating that the patient appears more agitated, having increased work of breathing, mentation seems to have deteriorated, is tachycardic to upper 130s, tachypneic 20-30s, and hypoxic in low 80s. Earlier in night Elink was contacted regarding tachycardia and 1mg  of IV ativan was given.   ABG ordered and resulted with worsening hypercapnic respiratory failure (ph=7.2, co2=105, o2=65, bicarb=43). Patient was started on bipap.   After starting bipap patient appears more comfortable. Still has expiratory wheezes and mild use of accessory muscle. However,respiratory rate has improved ranging 15-20. Patient's heart rate improved slightly.   -Continue bipap -Monitor heart rate and give metoprolol or diltiazem per day team preference  Lars Mage, MD Internal Medicine PGY1 Pager:[phone] 02/15/2018, 6:17 AM

## 2018-02-15 NOTE — Progress Notes (Signed)
Hypoglycemic Event  CBG: 67  Treatment: D50 IV 25 mL  Symptoms: Nervous/Shaky  Follow-up CBG: Time 0027 CBG Result:130  Possible Reasons for Event: Inadequate meal intake    Megan [name] [name]

## 2018-02-15 NOTE — Progress Notes (Addendum)
Spoke with daughter at depth about her mother's COPD. The patient is on 2.5L/min [place] at baseline and her functional status is poor because of her COPD. She has debilitating anxiety/PTSD after witnessing her son commit suicide. She denies the use of other illicit substance and states that she typically takes two Xanax per day. In the past she has had issues with the BiPAP and it typically intensifies her anxiety.   She states that her mother has always said she wants to "fight, fight, fight." She would want her to be intubated again if need and they would only be opposed to tracheostomy if it meant she would have to live in a nursing home. She states that her mother would not want to be in a nursing home.

## 2018-02-15 NOTE — Progress Notes (Signed)
[place] Progress Note Patient Name: Megan [name] DOB: [birth year] MRN: [id number]   Date of Service  02/15/2018  HPI/Events of Note  Tachycardia. Sinus rhythm ?Anxiety  eICU Interventions  Ativan 1 mg IV     Intervention Category Evaluation Type: Other  Ardell Makarewicz 02/15/2018, 2:30 AM

## 2018-02-15 NOTE — Progress Notes (Signed)
SLP Cancellation Note  Patient Details Name: Roniyah Llorens MRN: [id number] DOB: 1951/04/26   Cancelled treatment:       Reason Eval/Treat Not Completed: Medical issues which prohibited therapy. Pt requiring Bi-PAP over night, just came off this morning. Per discussion with RN will follow up later to ensure tolerance off Bi-PAP before trying POs.  [place] Jayveion Stalling SLP Student Clinician  [place] Violetta Lavalle 02/15/2018, 9:49 AM

## 2018-02-15 NOTE — Procedures (Signed)
Intubation Procedure Note Megan [name] [id number] [birth year]  Procedure: Intubation Indications: Respiratory insufficiency  Procedure Details Consent: Risks of procedure as well as the alternatives and risks of each were explained to the (patient/caregiver).  Consent for procedure obtained.   Time Out: Verified patient identification, verified procedure, site/side was marked, verified correct patient position, special equipment/implants available, medications/allergies/relevent history reviewed, required imaging and test results available.  Performed.  MAC and 3 / Glide-scope utilized for intubation.  Edentulous.  Vocal cords easily visualized.  ETT visualized passing through cords.  Positive color change on end-tidal CO2, condensation in tube, + bilateral breath sounds.  Fentanyl 50 mcg, Versed 1 mg and Etomidate 10 mg utilized for intubation.    Evaluation Hemodynamic Status: Transient hypotension treated with fluid (500 ml) with quick resolution; O2 sats: stable throughout Patient's Current Condition: stable Complications: No apparent complications Patient did tolerate procedure well. Chest X-ray ordered to verify placement.  CXR: pending.   Procedure performed under direct supervision of Dr. Lamonte Sakai and with glide-scope for direct visualization of intubation.  Megan Gens, NP-C [facility] Pgr: [phone] or if no answer [phone] 02/15/2018, 2:57 PM

## 2018-02-15 NOTE — Progress Notes (Signed)
Pt taken off Bipap per MD and placed on 6L HFNC, tolerating well at this time. RT will monitor

## 2018-02-15 NOTE — Progress Notes (Signed)
PT Cancellation Note  Patient Details Name: Megan [name] MRN: [id number] DOB: [birth year]   Cancelled Treatment:    Reason Eval/Treat Not Completed: Patient not medically ready. Per MD, hold for PT evaluation secondary to increased WOB.   [name], PT, DPT Acute Rehab Services  Pager: [place] 02/15/2018, 9:15 AM

## 2018-02-15 NOTE — Progress Notes (Addendum)
PULMONARY  / CRITICAL CARE MEDICINE  Name: Megan [name] MRN: [id number] DOB: [birth year]    LOS: 2  REFERRING MD :  EDP  CHIEF COMPLAINT:  SOB  BRIEF PATIENT DESCRIPTION: Patient is a 67 y.o female with COPD and a history of MRSA pneumonia requiring tracheostomy placement in April 2018 and HFpEF who presented with progressive dyspnea of 2 months duration. She was initially seen by her PCP in March and treated for an acute COPD exacerbation with prednisone and doxycycline; however, failed to improve. She then presented to her pulmonologist (Dr. Elsworth Soho) on 02/07/18 and again treated for a COPD exacerbation with an extended duration of prednisone and doxycycline. She failed to improve and presented to the ED. In the ED she was found to have worsening acute on chronic hypercarbic respiratory failure with a trial of BiPAP and was subsequently intubated. She was in septic shock requiring pressor support and PCCM was called for further management.   LINES / TUBES: Right IJ CVC 4/14  Right peripheral foot  CULTURES: 4/14: Blood cultures with no growth to date 4/14: Tracheal aspirate no growth to date  4/14: Respiratory Viral Panel positive for metapneumovirus   ANTIBIOTICS: Vancomycin 4/14 -> Discontinued on 4/15  Levofloxacin 4/14 -> Discontinued on 4/16  Azithromycin 4/16 ->   SIGNIFICANT EVENTS:  March treated for COPD exacerbation with Prednisone and Doxycycline >>> Again treated for COPD exacerbation with Prednisone and Doxyccyline 02/07/18 >>> Acute on Chronic Hypercarbic Respiratory Failure requiring intubation 02/12/18 >>> Extubated on 02/14/18 >>>  INTERVAL HISTORY:  Patient extubated over the interval.  Continue to struggle with severe anxiety.  Received 0.5 mg Xanax, then 0.5 mg Ativan, then 1 mg Ativan at 0230. Became somnolent around 4pm, ABG with elevated pCO2. Placed on BiPAP  No family at bedside.   VITAL SIGNS: Temp:  [97.5 F (36.4 C)-99.2 F (37.3 C)] 98.5 F (36.9 C)  (04/17 0412) Pulse Rate:  [25-153] 135 (04/17 0400) Resp:  [15-34] 30 (04/17 0400) BP: (120-183)/(72-130) 168/89 (04/17 0400) SpO2:  [74 %-100 %] 99 % (04/17 0400) FiO2 (%):  [40 %-50 %] 50 % (04/17 0400) Weight:  [96 lb 5.5 oz (43.7 kg)] 96 lb 5.5 oz (43.7 kg) (04/17 0500)  VENTILATOR SETTINGS: Vent Mode: CPAP;PSV FiO2 (%):  [40 %-50 %] 50 % Set Rate:  [12 bmp] 12 bmp Vt Set:  [420 mL] 420 mL PEEP:  [5 cmH20] 5 cmH20 Pressure Support:  [5 cmH20-14 cmH20] 5 cmH20  INTAKE / OUTPUT: Intake/Output      04/16 0701 - 04/17 0700   I.V. (mL/kg) 501.6 (11.5)   NG/GT 315   Total Intake(mL/kg) 816.6 (18.7)   Urine (mL/kg/hr) 1350 (1.3)   Total Output 1350   Net -533.5       Urine Occurrence 2 x    PHYSICAL EXAMINATION:  General: Thin elderly female, resting comfortably on BiPAP Neuro: Arouses to touch and verbal stimuli  HEENT: Normocephalic, atraumatic, ET in place Cardiovascular: tachycardic, no murmurs, no rubs Lungs: Good air movement with no obvious wheezing Abdomen: Active bowel sounds, soft, non-distended Musculoskeletal: No LE edema, pulses palpable  Skin: Warm and dry   LABS: Cbc Recent Labs  Lab 02/13/18 0507 02/14/18 0410 02/15/18 0358  WBC 9.3 7.2 13.9*  HGB 10.3* 10.4* 11.2*  HCT 33.1* 32.9* 37.0  PLT 311 262 397   Chemistry Recent Labs  Lab 02/13/18 0507 02/13/18 1324 02/14/18 0410 02/15/18 0358  NA 134*  --  139 142  K 4.0  --  3.9 4.4  CL 94*  --  98* 94*  CO2 33*  --  33* 40*  BUN 18  --  21* 20  CREATININE 1.05*  --  0.92 0.77  CALCIUM 7.6*  --  8.4* 8.4*  MG  --  1.6* 1.8  --   PHOS  --  3.0 3.0  --   GLUCOSE 211*  --  157* 137*   Liver fxn No results for input(s): AST, ALT, ALKPHOS, BILITOT, PROT, ALBUMIN in the last 168 hours.   coags No results for input(s): APTT, INR in the last 168 hours.   Sepsis markers Recent Labs  Lab 02/12/18 2036 02/13/18 0136 02/13/18 1349  LATICACIDVEN 2.11*  --  0.9  PROCALCITON  --  0.50  --     Cardiac markers No results for input(s): CKTOTAL, CKMB, TROPONINI in the last 168 hours.   BNP No results for input(s): PROBNP in the last 168 hours.   ABG Recent Labs  Lab 02/12/18 2029 02/13/18 0030 02/13/18 1131  PHART  --  7.154* 7.333*  PCO2ART  --  89.5* 67.8*  PO2ART  --  280* 90.0  HCO3  --  30.0* 36.0*  TCO2 38*  --  38*   CBG trend Recent Labs  Lab 02/14/18 1543 02/14/18 2004 02/14/18 2352 02/15/18 0027 02/15/18 0403  GLUCAP 102* 121* 65 130* 128*   DIAGNOSES: Active Problems:   Sepsis ([place])   Acute respiratory failure with hypoxia and hypercapnia (HCC)   Malnutrition of moderate degree  ASSESSMENT / PLAN: Patient is a 67 y.o female with COPD and a history of MRSA pneumonia requiring tracheostomy placement in April 2018 and HFpEF who presented with acute on chronic hypercarbic respiratory failure and septic shock.   PULMONARY A: H/o COPD (No PFTs on file but CT showing severe centrilobular emphysema) H/o of tracheostomy (Placed in April 2018, now removed) Acute on Chronic Hypercarbic Respiratory Failure (HCO3 baseline 32, lives with a pCO2 around 60-65) Acute COPD exacerbation 2/2 Metapneumonvirus   P:   Continue to follow cultures Discontinue Solumedrol 60 mg and transition to Prednisone 40 mg PO (Day #3/5) Continue Azithromycin (Day #2/3) Continue scheduled breathing treatments (pulmicort and duonebs) Limit central depressing medications (benzos). Will attempt to come off BiPAP this AM.  INFECTIOUS A: Acute COPD exacerbation 2/2 Metapneumonvirus   P:   Trend fever curve and WBC Continue Azithromycin (Day #2/3)  CARDIOVASCULAR A: H/o HFpEF (2016; LVEF 55-60% with hypokinesis of the midinferolateral and inferior myocardium, elevated PA pressures) H/o NSTEMI (2016; left heart cath without significant CAD) Sinus Tachycardia  Shock   P:  Continue Metoprolol 12.5 mg BID  Restart Diltiazem 240 mg QD  RENAL A:  Acute Kidney Injury  Likely  pre-renal in setting of sepsis   P:   Creatinine improved this AM. Continue to trend  Monitor urine output.   GASTROINTESTINAL A: Nutrition  GI ppx  P:   NPO until speech evaluation and off BiPAP Discontinue GI ppx   HEMATOLOGIC A: Iron Deficiency Anemia   P:  Monitor CBC  ENDOCRINE A: Normal AM cortisol (Cortisol 4.2 at 01:37) Hyperglycemia 2/2 steroids  P:   Stop SSI  NEUROLOGIC A:  Sedated  Anxiety   P:   Continue Clonopin 0.25 mg BID Ativan 05. Mg BID PRN Restart Sertraline 25 mg QD  Pulmonary and Critical Care Medicine [facility] Pager: [phone]  02/15/2018, 5:28 AM

## 2018-02-16 ENCOUNTER — Inpatient Hospital Stay (HOSPITAL_COMMUNITY): Payer: Medicare Other

## 2018-02-16 DIAGNOSIS — Z515 Encounter for palliative care: Secondary | ICD-10-CM

## 2018-02-16 DIAGNOSIS — Z7189 Other specified counseling: Secondary | ICD-10-CM

## 2018-02-16 LAB — BASIC METABOLIC PANEL
Anion gap: 8 (ref 5–15)
BUN: 47 mg/dL — AB (ref 6–20)
CALCIUM: 8.4 mg/dL — AB (ref 8.9–10.3)
CO2: 38 mmol/L — ABNORMAL HIGH (ref 22–32)
CREATININE: 1.02 mg/dL — AB (ref 0.44–1.00)
Chloride: 96 mmol/L — ABNORMAL LOW (ref 101–111)
GFR calc Af Amer: >60 mL/min (ref >60)
GFR, EST NON AFRICAN AMERICAN: 56 mL/min — AB (ref >60)
Glucose, Bld: 245 mg/dL — ABNORMAL HIGH (ref 65–99)
Potassium: 3.9 mmol/L (ref 3.5–5.1)
SODIUM: 142 mmol/L (ref 135–145)

## 2018-02-16 LAB — CBC
HCT: 33.5 % — ABNORMAL LOW (ref 36.0–46.0)
Hemoglobin: 10 g/dL — ABNORMAL LOW (ref 12.0–15.0)
MCH: 30.1 pg (ref 26.0–34.0)
MCHC: 29.9 g/dL — AB (ref 30.0–36.0)
MCV: 100.9 fL — ABNORMAL HIGH (ref 78.0–100.0)
PLATELETS: 284 10*3/uL (ref 150–400)
RBC: 3.32 MIL/uL — AB (ref 3.87–5.11)
RDW: 13.5 % (ref 11.5–15.5)
WBC: 7.3 10*3/uL (ref 4.0–10.5)

## 2018-02-16 LAB — GLUCOSE, CAPILLARY
GLUCOSE-CAPILLARY: 257 mg/dL — AB (ref 65–99)
GLUCOSE-CAPILLARY: 198 mg/dL — AB (ref 65–99)
Glucose-Capillary: 206 mg/dL — ABNORMAL HIGH (ref 65–99)
Glucose-Capillary: 182 mg/dL — ABNORMAL HIGH (ref 65–99)

## 2018-02-16 MED ORDER — FAMOTIDINE IN NACL 20-0.9 MG/50ML-% IV SOLN
20.0000 mg | INTRAVENOUS | Status: DC
  Administered 2018-02-17 – 2018-02-21 (×5): 20 mg via INTRAVENOUS
  Filled 2018-02-16 (×5): qty 50

## 2018-02-16 MED ORDER — DOCUSATE SODIUM 50 MG/5ML PO LIQD
100.0000 mg | Freq: Every day | ORAL | Status: DC
  Administered 2018-02-16: 100 mg
  Filled 2018-02-16 (×2): qty 10

## 2018-02-16 MED ORDER — AMLODIPINE 1 MG/ML ORAL SUSPENSION
10.0000 mg | Freq: Every day | ORAL | Status: DC
  Administered 2018-02-16: 10 mg
  Filled 2018-02-16 (×3): qty 10

## 2018-02-16 MED ORDER — INSULIN ASPART 100 UNIT/ML ~~LOC~~ SOLN
0.0000 [IU] | SUBCUTANEOUS | Status: DC
  Administered 2018-02-16 (×3): 2 [IU] via SUBCUTANEOUS
  Administered 2018-02-16: 3 [IU] via SUBCUTANEOUS
  Administered 2018-02-17 (×5): 2 [IU] via SUBCUTANEOUS
  Administered 2018-02-17 – 2018-02-18 (×2): 3 [IU] via SUBCUTANEOUS
  Administered 2018-02-18: 2 [IU] via SUBCUTANEOUS
  Administered 2018-02-18: 1 [IU] via SUBCUTANEOUS
  Administered 2018-02-18 – 2018-02-19 (×3): 2 [IU] via SUBCUTANEOUS
  Administered 2018-02-19 (×2): 1 [IU] via SUBCUTANEOUS
  Administered 2018-02-19 – 2018-02-20 (×2): 2 [IU] via SUBCUTANEOUS
  Administered 2018-02-20: 1 [IU] via SUBCUTANEOUS
  Administered 2018-02-20: 2 [IU] via SUBCUTANEOUS
  Administered 2018-02-22: 5 [IU] via SUBCUTANEOUS

## 2018-02-16 MED ORDER — SENNOSIDES 8.8 MG/5ML PO SYRP
5.0000 mL | ORAL_SOLUTION | Freq: Every day | ORAL | Status: DC
  Administered 2018-02-16 – 2018-02-19 (×3): 5 mL
  Filled 2018-02-16 (×6): qty 5

## 2018-02-16 MED ORDER — HYDRALAZINE HCL 20 MG/ML IJ SOLN: 10.0000 mg | INTRAMUSCULAR | Status: DC | PRN

## 2018-02-16 NOTE — Plan of Care (Signed)
  Problem: Coping: Goal: Level of anxiety will decrease Outcome: Not Progressing Note:  Patient with very high anxiety level. Patient has episodes of panic where her heart rate increases to 150's and it is difficult to console her. Patient given dose of fentanyl and she was able to go back to sleep. Approximately 3 episodes of this high anxiety over shift.

## 2018-02-16 NOTE — Consult Note (Signed)
Consultation Note Date: 02/16/2018   Patient Name: Megan [name]  DOB: 09-07-1951  MRN: [id number]  Age / Sex: 67 y.o., female  PCP: [name], MD Referring Physician: Collene Gobble, MD  Reason for Consultation: Establishing goals of care  HPI/Patient Profile: 67 y.o. female  with past medical history of COPD, MRSA pna, HF, NSTEMI, and trach in Apri 2018 admitted on 02/12/2018 with dyspnea x 11month. Treated for COPD exacerbation - intubated on admission 4/14. Extubated 4/16 and re-intubated 4/17. Positive for metapneumovirus.  PMT consulted for GMathis   Clinical Assessment and Goals of Care: I have reviewed medical records including EPIC notes, labs and imaging, received report from Dr. HTarri Abernethy assessed the patient and then met at the bedside  to discuss diagnosis prognosis, GOC, EOL wishes, disposition and options.  On chart review, patient has MOST form that was completed in 2016 opting for all aggressive medical interventions. Also, patient has met with PMT before in 2016 and spoke of desiring aggressive treatment options at that time as well.   Patient unable to participate in GPelhamconversation d/t intubation and sedation. Spoke with patient's daughter, CMyriam Jacobson CMyriam Jacobsonis unable to be at the bedside often because she has 2 small children with Menkes syndrome at home she has to care for. She tells me she has no other support - her mom is a big source of support. She also shares that she had a 276year old son who passed away several months ago on a ventilator d/t Menkes syndrome.   I introduced Palliative Medicine as specialized medical care for people living with serious illness. It focuses on providing relief from the symptoms and stress of a serious illness. The goal is to improve quality of life for both the patient and the family.  We discussed a brief life review of the patient. [place] She tells me her grandchildren are very  important to the patient. Also, shares with me that patient is a "strong woman".  As far as functional status, CMyriam Jacobsondescribes the patient as "superwoman". She tells me that the patient was incredibly functional for the past year after removal of trach. However, she also shares that the patient's activity is limited d/t shortness of breath and anxiety.    We discussed her current illness of COPD and how it is chronic and progressive. CMyriam Jacobsonunderstands this; however she tells me multiple times throughout our conversation that her mom "will fight to the end" and "she won't quit". She tells me her mother would want a trach if it came to that. The only limit she set is that she would never want her mother placed in a SNF.   She tells me her mother needs support at home - she tells me she needs bipap and better symptom control. She tells me that they have previously had home health support, but her mother would refuse therapy after a few visits.   She believes her mother's biggest issue is anxiety which leads to shortness of breath. We discussed that it is a cycle and one feeds the other. We discussed symptom management options - medications for anxiety/dyspnea and a fan. She tells me she wants to limit narcotics.   I introduced hospice as a source of support that specializes in symptom management. However, patient's goals of care are not in line with hospice philosophy - she would want to be rehospitalized/intubated/trach if necessary.  Questions and concerns were addressed. The family was encouraged to call with  questions or concerns.   Primary Decision Maker NEXT OF KIN daughter [name]; patient if/when extubated and alert   SUMMARY OF RECOMMENDATIONS   - [place] clear: full code/full scope treatment -would want trach if unable to wean - PMT will follow along for decisions that may come up and can assist with symptom management if/when extubated  Code Status/Advance Care Planning:  Full  code   Symptom Management:   Per primary  Palliative Prophylaxis:   Bowel Regimen, Frequent Pain Assessment and Oral Care  Additional Recommendations (Limitations, Scope, Preferences):  Full Scope Treatment  Psycho-social/Spiritual:   Desire for further Chaplaincy support:no  Additional Recommendations: Caregiving  Support/Resources and Education on Hospice  Prognosis:   Unable to determine  Discharge Planning: To Be Determined      Primary Diagnoses: Present on Admission: . Acute respiratory failure with hypoxia and hypercapnia (HCC)   I have reviewed the medical record, interviewed the patient and family, and examined the patient. The following aspects are pertinent.  Past Medical History:  Diagnosis Date  . Anemia   . Anxiety state 08/20/2015  . CAP (community acquired pneumonia)   . CHF (congestive heart failure) ([place])   . COPD (chronic obstructive pulmonary disease) ([place])   . Depression   . Essential hypertension 08/19/2015  . GERD (gastroesophageal reflux disease)   . Headache   . History of hiatal hernia   . Myocardial infarction ([place])   . Shortness of breath dyspnea    Social History   Socioeconomic History  . Marital status: Widowed    Spouse name: Not on file  . Number of children: 3  . Years of education: 87  . Highest education level: Not on file  Occupational History  . Occupation: Disability  Social Needs  . Financial resource strain: Not on file  . Food insecurity:    Worry: Not on file    Inability: Not on file  . Transportation needs:    Medical: Not on file    Non-medical: Not on file  Tobacco Use  . Smoking status: Former Smoker    Packs/day: 1.00    Years: 30.00    Pack years: 30.00    Types: Cigarettes    Last attempt to quit: 08/18/2000    Years since quitting: 17.5  . Smokeless tobacco: Never Used  Substance and Sexual Activity  . Alcohol use: No  . Drug use: No  . Sexual activity: Never  Lifestyle  . Physical  activity:    Days per week: Not on file    Minutes per session: Not on file  . Stress: Not on file  Relationships  . Social connections:    Talks on phone: Not on file    Gets together: Not on file    Attends religious service: Not on file    Active member of club or organization: Not on file    Attends meetings of clubs or organizations: Not on file    Relationship status: Not on file  Other Topics Concern  . Not on file  Social History Narrative   Denies abuse and feels safe at home.    Family History  Problem Relation Age of Onset  . Cirrhosis Mother   . Depression Mother   . Heart disease Father    Scheduled Meds: . budesonide (PULMICORT) nebulizer solution  0.5 mg Nebulization BID  . chlorhexidine gluconate (MEDLINE KIT)  15 mL Mouth Rinse BID  . Chlorhexidine Gluconate Cloth  6 each Topical Daily  .  clonazepam  0.25 mg Per Tube BID  . fentaNYL (SUBLIMAZE) injection  50 mcg Intravenous Once  . heparin  5,000 Units Subcutaneous Q8H  . insulin aspart  0-9 Units Subcutaneous Q4H  . ipratropium-albuterol  3 mL Nebulization Q6H  . mouth rinse  15 mL Mouth Rinse 10 times per day  . methylPREDNISolone (SOLU-MEDROL) injection  40 mg Intravenous Q12H  . metoprolol tartrate  5 mg Intravenous Q6H  . midazolam  1 mg Intravenous Once  . sertraline  25 mg Per Tube Daily  . sodium chloride flush  10-40 mL Intracatheter Q12H   Continuous Infusions: . sodium chloride    . albuterol 10 mg/hr (02/15/18 1223)  . dexmedetomidine 0.603 mcg/kg/hr (02/16/18 0800)  . famotidine (PEPCID) IV 20 mg (02/16/18 0900)  . feeding supplement (VITAL AF 1.2 CAL) 45 mL/hr at 02/16/18 0800   PRN Meds:.sodium chloride, acetaminophen, fentaNYL (SUBLIMAZE) injection, hydrALAZINE, phenol, sodium chloride flush Allergies  Allergen Reactions  . Penicillins Rash and Hives    Has patient had a PCN reaction causing immediate rash, facial/tongue/throat swelling, SOB or lightheadedness with hypotension:  No Has patient had a PCN reaction causing severe rash involving mucus membranes or skin necrosis:NO Has patient had a PCN reaction that required hospitalization No Has patient had a PCN reaction occurring within the last 10 years:NO If all of the above answers are "NO", then may proceed with Cephalosporin use.  . Fentanyl And Related Other (See Comments)    Behavioral changes per daughter   Review of Systems  Unable to perform ROS: Intubated    Physical Exam  Constitutional: She appears cachectic. No distress. She is sedated and intubated.  HENT:  Head: Normocephalic and atraumatic.  Cardiovascular: Tachycardia present.  Pulmonary/Chest: Breath sounds normal. She is intubated.  Abdominal: Soft. Bowel sounds are normal.  Musculoskeletal:       Right lower leg: She exhibits no edema.       Left lower leg: She exhibits no edema.  Skin: Skin is warm and dry.    Vital Signs: BP (!) 159/85 (BP Location: Left Arm)   Pulse 98   Temp 97.8 F (36.6 C) (Axillary)   Resp 19   Ht _0  (1.6 m)   Wt 40.9 kg (90 lb 2.7 oz)   SpO2 98%   BMI 15.97 kg/m  Pain Scale: CPOT   Pain Score: 7    SpO2: SpO2: 98 % O2 Device:SpO2: 98 % O2 Flow Rate: .O2 Flow Rate (L/min): 8 L/min  IO: Intake/output summary:   Intake/Output Summary (Last 24 hours) at 02/16/2018 0954 Last data filed at 02/16/2018 0800 Gross per 24 hour  Intake 912.79 ml  Output 625 ml  Net 287.79 ml    LBM: Last BM Date: (PTA) Baseline Weight: Weight: 45.1 kg (99 lb 6.8 oz) Most recent weight: Weight: 40.9 kg (90 lb 2.7 oz)     Palliative Assessment/Data: PPS 60%     Time In: 0900 Time Out: 1000 Time Total: 60 minutes Greater than 50%  of this time was spent counseling and coordinating care related to the above assessment and plan.  Juel Burrow, DNP, AGNP-C Palliative Medicine Team [phone]

## 2018-02-16 NOTE — Progress Notes (Addendum)
PULMONARY  / CRITICAL CARE MEDICINE  Name: Megan [name] MRN: [id number] DOB: 1951/05/29    LOS: 49  REFERRING MD :  EDP  CHIEF COMPLAINT:  SOB  BRIEF PATIENT DESCRIPTION: Patient is a 67 y.o female with COPD and a history of MRSA pneumonia requiring tracheostomy placement in April 2018 and HFpEF who presented with progressive dyspnea of 2 months duration. She was initially seen by her PCP in March and treated for an acute COPD exacerbation with prednisone and doxycycline; however, failed to improve. She then presented to her pulmonologist (Dr. Elsworth Soho) on 02/07/18 and again treated for a COPD exacerbation with an extended duration of prednisone and doxycycline. She failed to improve and presented to the ED. In the ED she was found to have worsening acute on chronic hypercarbic respiratory failure with a trial of BiPAP and was subsequently intubated. She was in septic shock requiring pressor support and PCCM was called for further management.   LINES / TUBES: Right IJ CVC 4/14  Right peripheral foot Endotracheal tube 4/17  CULTURES: 4/14: Blood cultures with no growth to date 4/14: Tracheal aspirate no growth to date  4/14: Respiratory Viral Panel positive for metapneumovirus   ANTIBIOTICS: Vancomycin 4/14 -> Discontinued on 4/15  Levofloxacin 4/14 -> Discontinued on 4/16  Azithromycin 4/16 ->   SIGNIFICANT EVENTS:  March treated for COPD exacerbation with Prednisone and Doxycycline >>> Again treated for COPD exacerbation with Prednisone and Doxyccyline 02/07/18 >>> Acute on Chronic Hypercarbic Respiratory Failure requiring intubation 02/12/18 >>> Extubated on 02/14/18 >>> Re-intubated for acute on chronic hypercarbic respiratory failure 02/15/18 >>>  INTERVAL HISTORY: Very anxious over night.  Febrile over the interval.  On precedex for sedation with PRN fentanyl.   No family at bedside.   VITAL SIGNS: Temp:  [97.3 F (36.3 C)-100.6 F (38.1 C)] 100.6 F (38.1 C) (04/17 2346) Pulse  Rate:  [71-141] 74 (04/18 0700) Resp:  [16-30] 18 (04/18 0700) BP: (73-185)/(48-102) 165/85 (04/18 0700) SpO2:  [93 %-100 %] 99 % (04/18 0700) FiO2 (%):  [30 %-60 %] 30 % (04/18 0331) Weight:  [90 lb 2.7 oz (40.9 kg)] 90 lb 2.7 oz (40.9 kg) (04/18 0342)  VENTILATOR SETTINGS: Vent Mode: PRVC FiO2 (%):  [30 %-60 %] 30 % Set Rate:  [16 bmp-18 bmp] 18 bmp Vt Set:  [410 mL-420 mL] 410 mL PEEP:  [5 cmH20] 5 cmH20 Plateau Pressure:  [17 cmH20-20 cmH20] 19 cmH20  INTAKE / OUTPUT: Intake/Output      04/17 0701 - 04/18 0700 04/18 0701 - 04/19 0700   I.V. (mL/kg) 167.2 (4.1)    Other 120    NG/GT 501.8    IV Piggyback 50    Total Intake(mL/kg) 839 (20.5)    Urine (mL/kg/hr) 675 (0.7)    Total Output 675    Net +164          PHYSICAL EXAMINATION:  General: Thin elderly female, resting comfortably  Neuro: Alert, anxious, trying to communicate HEENT: Normocephalic, atraumatic, ET in place Cardiovascular: tachycardic, no murmurs, no rubs Lungs: Good air movement with minor wheezing Abdomen: Active bowel sounds, soft, non-distended Musculoskeletal: No LE edema, pulses palpable  Skin: Warm and dry   LABS: Cbc Recent Labs  Lab 02/14/18 0410 02/15/18 0358 02/16/18 0344  WBC 7.2 13.9* 7.3  HGB 10.4* 11.2* 10.0*  HCT 32.9* 37.0 33.5*  PLT 262 397 284   Chemistry Recent Labs  Lab 02/13/18 1324 02/14/18 0410 02/15/18 0358 02/16/18 0344  NA  --  139 142  142  K  --  3.9 4.4 3.9  CL  --  98* 94* 96*  CO2  --  33* 40* 38*  BUN  --  21* 20 47*  CREATININE  --  0.92 0.77 1.02*  CALCIUM  --  8.4* 8.4* 8.4*  MG 1.6* 1.8  --   --   PHOS 3.0 3.0  --   --   GLUCOSE  --  157* 137* 245*   Liver fxn No results for input(s): AST, ALT, ALKPHOS, BILITOT, PROT, ALBUMIN in the last 168 hours.   coags No results for input(s): APTT, INR in the last 168 hours.   Sepsis markers Recent Labs  Lab 02/12/18 2036 02/13/18 0136 02/13/18 1349  LATICACIDVEN 2.11*  --  0.9  PROCALCITON  --   0.50  --    Cardiac markers No results for input(s): CKTOTAL, CKMB, TROPONINI in the last 168 hours.   BNP No results for input(s): PROBNP in the last 168 hours.   ABG Recent Labs  Lab 02/12/18 2029  02/13/18 1131 02/15/18 0530 02/15/18 1228 02/15/18 1609  PHART  --    < > 7.333* 7.240* 7.250* 7.368  PCO2ART  --    < > 67.8* 105* 102* 73.1*  PO2ART  --    < > 90.0 65.3* 275* 234.0*  HCO3  --    < > 36.0* 43.2* 43.0* 42.0*  TCO2 38*  --  38*  --   --  44*   < > = values in this interval not displayed.   CBG trend Recent Labs  Lab 02/15/18 0750 02/15/18 1146 02/15/18 1535 02/15/18 2048 02/15/18 2348  GLUCAP 81 84 104* 127* 175*   DIAGNOSES: Active Problems:   Sepsis (HCC)   Acute respiratory failure with hypoxia and hypercapnia (HCC)   Malnutrition of moderate degree  ASSESSMENT / PLAN: Patient is a 67 y.o female with COPD and a history of MRSA pneumonia requiring tracheostomy placement in April 2018 and HFpEF who presented with acute on chronic hypercarbic respiratory failure and septic shock.   PULMONARY A: H/o COPD (No PFTs on file but CT showing severe centrilobular emphysema) H/o of tracheostomy (Placed in April 2018, now removed) Acute on Chronic Hypercarbic Respiratory Failure (HCO3 baseline 32, lives with a pCO2 around 60-65) Acute COPD exacerbation 2/2 Metapneumonvirus   P:   Continue Solumedrol 40 mg BID (Day #4) Finish up Azithromycin (Day #3/3) Continue scheduled breathing treatments (pulmicort and duonebs) Limit central depressing medications (benzos).  Will attempt SBT  INFECTIOUS A: Acute COPD exacerbation 2/2 Metapneumonvirus   P:   Trend fever curve and WBC Continue Azithromycin (Day #3/3)  CARDIOVASCULAR A: H/o HFpEF (2016; LVEF 55-60% with hypokinesis of the midinferolateral and inferior myocardium, elevated PA pressures) H/o NSTEMI (2016; left heart cath without significant CAD) Sinus Tachycardia  Shock   P:  Continue metoprolol 5  mg every 6 hours   RENAL A:  Acute Kidney Injury  Likely pre-renal in setting of sepsis   P:   Continue to trend creatinine.  Monitor urine output.   GASTROINTESTINAL A: Nutrition  GI ppx  P:   Started on tube feeds.  Continue GI ppx   HEMATOLOGIC A: Iron Deficiency Anemia   P:  Monitor CBC  ENDOCRINE A: Normal AM cortisol (Cortisol 4.2 at 01:37) Hyperglycemia 2/2 steroids  P:   SSI every 4 hours while on tube feeds.  NEUROLOGIC A:  Sedated  Anxiety   P:   Continue Klonopin 0.25 mg BID  Continue Zoloft 25 mg QD  Pulmonary and [place] Pager: [phone]  02/16/2018, 7:40 AM

## 2018-02-16 NOTE — Care Management Note (Signed)
Case Management Note  Patient Details  Name: Megan [name] MRN: [id number] Date of Birth: 1951/10/20  Subjective/Objective:     Pt admitted with metapnemovirus                Action/Plan:  PTA from home.  Pt is ventilated.  Palliative care consulted   Expected Discharge Date:                  Expected Discharge Plan:     In-House Referral:     Discharge planning Services  CM Consult  Post Acute Care Choice:    Choice offered to:     DME Arranged:    DME Agency:     HH Arranged:    HH Agency:     Status of Service:     If discussed at [employer], dates discussed:    Additional Comments:  Maryclare Labrador, RN 02/16/2018, 2:59 PM

## 2018-02-16 NOTE — Progress Notes (Addendum)
Nutrition Follow-up  DOCUMENTATION CODES:   Non-severe (moderate) malnutrition in context of chronic illness, Underweight  INTERVENTION:  Continue TF Vital 1.2 @ 45 ml/hr (1097m/24hrs) providing 1296 kcal, 81 grams protein, and 876 ml H2O.    NUTRITION DIAGNOSIS:   Moderate Malnutrition related to chronic illness(COPD) as evidenced by mild fat depletion, mild muscle depletion, moderate muscle depletion, severe muscle depletion.  Ongoing  GOAL:   Patient will meet greater than or equal to 90% of their needs  Met with tube feeding  MONITOR:   Vent status, Weight trends, Labs, I & O's, Diet advancement  REASON FOR ASSESSMENT:   Ventilator    ASSESSMENT:   67yo female with PMH of MI, CHF, COPD, depression, GERD, anemia, HTN, and anxiety who was admitted on 4/14 with SOB and worsening respiratory failure related to LLL PNA, requiring intubation on admission.    Pt extubated on 4/16, re-intubated on 4/17.   Pt sleeping. Spoke with RN, pt is currently tolerating feed well and that pt is doing much better today than yesterday. Pt is still anxious, but is now on precedex. Observed Vital HP hanging; informed RN who said she will change it to Vital 1.2 as ordered.   Current EN: Vital 1.2 @ 45 ml/hr.   Medications reviewed: pulmicort, sublimaze, heparin, novolog 0-9 units, solu-medrol, lopressor, versed, sodium chloride flush 0.9% 10-40 ml, pepcid, precedex, fentanyl.   MVe: 5.2  MAP: 116  Temp (24hrs), Avg:98.8 F (37.1 C), Min:97.3 F (36.3 C), Max:100.6 F (38.1 C)  Labs reviewed: Cl 96 (L), CO2 38 (H), BG 245 (H), BUN 47 (H), creatinine 1.02 (H), GFR 56 (L), RBC 3.32 (L), hemoglobin 10 (L), HCT 33.5 (L).    Intake/Output Summary (Last 24 hours) at 02/16/2018 0947 Last data filed at 02/16/2018 0800 Gross per 24 hour  Intake 912.79 ml  Output 625 ml  Net 287.79 ml   Lab Results  Component Value Date   HGBA1C 5.5 01/19/2018    Diet Order:  Diet NPO time  specified  EDUCATION NEEDS:   Not appropriate for education at this time  Skin:  Skin Assessment: Reviewed RN Assessment  Last BM:  unknown  Height:   Ht Readings from Last 1 Encounters:  02/15/18 _0  (1.6 m)    Weight:   Wt Readings from Last 1 Encounters:  02/16/18 90 lb 2.7 oz (40.9 kg)    Ideal Body Weight:  52.3 kg  BMI:  Body mass index is 15.97 kg/m.  Estimated Nutritional Needs:   Kcal:  1280 kcal  Protein:  70-85 gm  Fluid:  1.4 - 1.5 L  MHope Budds Dietetic Intern

## 2018-02-16 NOTE — Progress Notes (Signed)
Results for RENIYA, MCCLEES (MRN [id number]) as of 02/16/2018 12:51  Ref. Range 02/15/2018 15:35 02/15/2018 20:48 02/15/2018 23:48 02/16/2018 08:05 02/16/2018 11:59  Glucose-Capillary Latest Ref Range: 65 - 99 mg/dL 104 (H) 127 (H) 175 (H) 257 (H) 206 (H)  Noted that blood sugars continue to be greater than 180 mg/dl.  Recommend patient being on ICU hyperglycemia protocol. With current orders, recommend adding Novolog 3 units every 4 hours as tube feed coverage. May need to adjust dosage according to blood sugar trend. Will continue to monitor blood sugars while in the hospital.   Harvel Ricks RN BSN CDE Diabetes Coordinator Pager: [phone]  8am-5pm

## 2018-02-17 ENCOUNTER — Inpatient Hospital Stay (HOSPITAL_COMMUNITY): Payer: Medicare Other

## 2018-02-17 LAB — BASIC METABOLIC PANEL
Anion gap: 9 (ref 5–15)
BUN: 29 mg/dL — AB (ref 6–20)
CO2: 37 mmol/L — ABNORMAL HIGH (ref 22–32)
CREATININE: 0.66 mg/dL (ref 0.44–1.00)
Calcium: 8.4 mg/dL — ABNORMAL LOW (ref 8.9–10.3)
Chloride: 94 mmol/L — ABNORMAL LOW (ref 101–111)
GFR calc Af Amer: >60 mL/min (ref >60)
GFR calc non Af Amer: >60 mL/min (ref >60)
GLUCOSE: 252 mg/dL — AB (ref 65–99)
POTASSIUM: 3.4 mmol/L — AB (ref 3.5–5.1)
Sodium: 140 mmol/L (ref 135–145)

## 2018-02-17 LAB — GLUCOSE, CAPILLARY
GLUCOSE-CAPILLARY: 184 mg/dL — AB (ref 65–99)
GLUCOSE-CAPILLARY: 151 mg/dL — AB (ref 65–99)
Glucose-Capillary: 168 mg/dL — ABNORMAL HIGH (ref 65–99)
Glucose-Capillary: 170 mg/dL — ABNORMAL HIGH (ref 65–99)
Glucose-Capillary: 175 mg/dL — ABNORMAL HIGH (ref 65–99)
Glucose-Capillary: 193 mg/dL — ABNORMAL HIGH (ref 65–99)
Glucose-Capillary: 229 mg/dL — ABNORMAL HIGH (ref 65–99)

## 2018-02-17 LAB — CULTURE, BLOOD (ROUTINE X 2)
CULTURE: NO GROWTH
CULTURE: NO GROWTH

## 2018-02-17 LAB — CBC
HEMATOCRIT: 31.7 % — AB (ref 36.0–46.0)
Hemoglobin: 9.8 g/dL — ABNORMAL LOW (ref 12.0–15.0)
MCH: 30.2 pg (ref 26.0–34.0)
MCHC: 30.9 g/dL (ref 30.0–36.0)
MCV: 97.8 fL (ref 78.0–100.0)
Platelets: 225 10*3/uL (ref 150–400)
RBC: 3.24 MIL/uL — ABNORMAL LOW (ref 3.87–5.11)
RDW: 13.5 % (ref 11.5–15.5)
WBC: 6.3 10*3/uL (ref 4.0–10.5)

## 2018-02-17 MED ORDER — AMLODIPINE BESYLATE 10 MG PO TABS
10.0000 mg | ORAL_TABLET | Freq: Every day | ORAL | Status: DC
  Administered 2018-02-17 – 2018-02-20 (×4): 10 mg
  Filled 2018-02-17 (×4): qty 1

## 2018-02-17 MED ORDER — CLONAZEPAM 0.5 MG PO TBDP
0.5000 mg | ORAL_TABLET | Freq: Two times a day (BID) | ORAL | Status: DC
  Administered 2018-02-17 – 2018-02-21 (×8): 0.5 mg
  Filled 2018-02-17 (×8): qty 1

## 2018-02-17 MED ORDER — POTASSIUM CHLORIDE 20 MEQ PO PACK
40.0000 meq | PACK | Freq: Two times a day (BID) | ORAL | Status: DC
  Administered 2018-02-17: 40 meq
  Filled 2018-02-17: qty 2

## 2018-02-17 MED ORDER — KETOROLAC TROMETHAMINE 15 MG/ML IJ SOLN
15.0000 mg | Freq: Once | INTRAMUSCULAR | Status: AC
  Administered 2018-02-17: 15 mg via INTRAVENOUS
  Filled 2018-02-17: qty 1

## 2018-02-17 MED ORDER — DIPHENHYDRAMINE HCL 25 MG PO CAPS
25.0000 mg | ORAL_CAPSULE | Freq: Once | ORAL | Status: AC
  Administered 2018-02-17: 25 mg
  Filled 2018-02-17: qty 1

## 2018-02-17 MED ORDER — POLYETHYLENE GLYCOL 3350 17 G PO PACK
17.0000 g | PACK | Freq: Every day | ORAL | Status: DC
  Administered 2018-02-19: 17 g
  Filled 2018-02-17 (×5): qty 1

## 2018-02-17 MED ORDER — SENNOSIDES-DOCUSATE SODIUM 8.6-50 MG PO TABS
1.0000 | ORAL_TABLET | Freq: Two times a day (BID) | ORAL | Status: DC
  Administered 2018-02-17 – 2018-02-26 (×18): 1 via ORAL
  Filled 2018-02-17 (×22): qty 1

## 2018-02-17 MED ORDER — POTASSIUM CHLORIDE 20 MEQ/15ML (10%) PO SOLN
40.0000 meq | Freq: Once | ORAL | Status: AC
  Administered 2018-02-17: 40 meq
  Filled 2018-02-17: qty 30

## 2018-02-17 MED ORDER — ALPRAZOLAM 0.5 MG PO TABS
0.5000 mg | ORAL_TABLET | Freq: Two times a day (BID) | ORAL | Status: DC | PRN
  Administered 2018-02-17 – 2018-02-21 (×7): 0.5 mg
  Filled 2018-02-17 (×8): qty 1

## 2018-02-17 MED ORDER — PROCHLORPERAZINE EDISYLATE 10 MG/2ML IJ SOLN
10.0000 mg | Freq: Once | INTRAMUSCULAR | Status: DC
  Filled 2018-02-17: qty 2

## 2018-02-17 MED ORDER — KETOROLAC TROMETHAMINE 15 MG/ML IJ SOLN
15.0000 mg | Freq: Three times a day (TID) | INTRAMUSCULAR | Status: AC | PRN
  Administered 2018-02-17 – 2018-02-21 (×8): 15 mg via INTRAVENOUS
  Filled 2018-02-17 (×9): qty 1

## 2018-02-17 MED ORDER — KETOROLAC TROMETHAMINE 15 MG/ML IJ SOLN
15.0000 mg | Freq: Three times a day (TID) | INTRAMUSCULAR | Status: DC
  Filled 2018-02-17: qty 1

## 2018-02-17 NOTE — Plan of Care (Signed)
  Problem: Clinical Measurements: Goal: Respiratory complications will improve Outcome: Progressing Note:  Patient tolerated weaning all day during day shift 4/18. Placed back on PRVC overnight to rest.    Problem: Coping: Goal: Level of anxiety will decrease Outcome: Progressing Note:  Extreme anxiety continues to be a challenge for patient. Able to calm her down and redirect her with conversation at times, but she becomes very agitated and irritable when she is in these high anxiety states.

## 2018-02-17 NOTE — Progress Notes (Signed)
No charge note.  Palliative care:  Rounded on patient - weaning on pressure support. No family at bedside. Patient resting. RN tells me of periods of extreme anxiety. Spoke w/family yesterday - goals of care clear for aggressive care. Wishes for aggressive interventions expressed by patient prior to intubation and on MOST form in chart. PMT will continue to shadow chart. Please call if needed.  Juel Burrow, DNP, AGNP-C Palliative Medicine Team Team Phone # [phone]

## 2018-02-17 NOTE — Progress Notes (Signed)
PULMONARY  / CRITICAL CARE MEDICINE  Name: Megan [name] MRN: [id number] DOB: October 16, 1951    LOS: 67  REFERRING MD :  EDP  CHIEF COMPLAINT:  SOB  BRIEF PATIENT DESCRIPTION: Patient is a 67 y.o female with COPD and a history of MRSA pneumonia requiring tracheostomy placement in April 2018 and HFpEF who presented with progressive dyspnea of 2 months duration. She was initially seen by her PCP in March and treated for an acute COPD exacerbation with prednisone and doxycycline; however, failed to improve. She then presented to her pulmonologist (Dr. Elsworth [name]) on 02/07/18 and again treated for a COPD exacerbation with an extended duration of prednisone and doxycycline. She failed to improve and presented to the ED. In the ED she was found to have worsening acute on chronic hypercarbic respiratory failure with a trial of BiPAP and was subsequently intubated. She was in septic shock requiring pressor support and PCCM was called for further management.   LINES / TUBES: Right IJ CVC 4/14  Endotracheal tube 4/17  CULTURES: 4/14: Blood cultures with no growth to date 4/14: Tracheal aspirate no growth to date  4/14: Respiratory Viral Panel positive for metapneumovirus   ANTIBIOTICS: Vancomycin 4/14 -> Discontinued on 4/15  Levofloxacin 4/14 -> Discontinued on 4/16  Azithromycin 4/16 -> Finished on 4/18  SIGNIFICANT EVENTS:  March treated for COPD exacerbation with Prednisone and Doxycycline >>> Again treated for COPD exacerbation with Prednisone and Doxyccyline 02/07/18 >>> Acute on Chronic Hypercarbic Respiratory Failure requiring intubation 02/12/18 >>> Extubated on 02/14/18 >>> Re-intubated for acute on chronic hypercarbic respiratory failure 02/15/18 >>>  INTERVAL HISTORY: Very anxious this AM. She is specifically asking for Xanax. She has a sore throat, chest tightness, and feels very anxious.   Spoke with family yesterday. Full aggressive care.   VITAL SIGNS: Temp:  [97.4 F (36.3 C)-98.8 F (37.1  C)] 97.5 F (36.4 C) (04/19 0734) Pulse Rate:  [50-128] 81 (04/19 0530) Resp:  [15-29] 18 (04/19 0530) BP: (89-170)/(64-117) 130/79 (04/19 0530) SpO2:  [95 %-100 %] 100 % (04/19 0530) FiO2 (%):  [30 %] 30 % (04/19 0359) Weight:  [90 lb 2.7 oz (40.9 kg)] 90 lb 2.7 oz (40.9 kg) (04/19 0500)  VENTILATOR SETTINGS: Vent Mode: PRVC FiO2 (%):  [30 %] 30 % Set Rate:  [18 bmp] 18 bmp Vt Set:  [410 mL] 410 mL PEEP:  [5 cmH20] 5 cmH20 Pressure Support:  [15 cmH20] 15 cmH20 Plateau Pressure:  [21 cmH20-25 cmH20] 25 cmH20  INTAKE / OUTPUT: Intake/Output      04/18 0701 - 04/19 0700 04/19 0701 - 04/20 0700   I.V. (mL/kg) 233.1 (5.7)    Other     NG/GT 1080    IV Piggyback     Total Intake(mL/kg) 1313.1 (32.1)    Urine (mL/kg/hr) 905 (0.9)    Total Output 905    Net +408.1         Urine Occurrence 1 x     PHYSICAL EXAMINATION:  General: Thin elderly female, anxious Neuro: Alert, anxious, writing on paper to communicate HEENT: Normocephalic, atraumatic, ET in place Cardiovascular: tachycardic, no murmurs, no rubs Lungs: Good air movement, wheezing improved Abdomen: Active bowel sounds, soft, non-distended Musculoskeletal: No LE edema, pulses palpable  Skin: Warm and dry   LABS: Cbc Recent Labs  Lab 02/15/18 0358 02/16/18 0344 02/17/18 0503  WBC 13.9* 7.3 6.3  HGB 11.2* 10.0* 9.8*  HCT 37.0 33.5* 31.7*  PLT 397 284 225   Chemistry Recent Labs  Lab  02/13/18 1324 02/14/18 0410 02/15/18 0358 02/16/18 0344 02/17/18 0503  NA  --  139 142 142 140  K  --  3.9 4.4 3.9 3.4*  CL  --  98* 94* 96* 94*  CO2  --  33* 40* 38* 37*  BUN  --  21* 20 47* 29*  CREATININE  --  0.92 0.77 1.02* 0.66  CALCIUM  --  8.4* 8.4* 8.4* 8.4*  MG 1.6* 1.8  --   --   --   PHOS 3.0 3.0  --   --   --   GLUCOSE  --  157* 137* 245* 252*   Liver fxn No results for input(s): AST, ALT, ALKPHOS, BILITOT, PROT, ALBUMIN in the last 168 hours.   coags No results for input(s): APTT, INR in the last  168 hours.   Sepsis markers Recent Labs  Lab 02/12/18 2036 02/13/18 0136 02/13/18 1349  LATICACIDVEN 2.11*  --  0.9  PROCALCITON  --  0.50  --    Cardiac markers No results for input(s): CKTOTAL, CKMB, TROPONINI in the last 168 hours.   BNP No results for input(s): PROBNP in the last 168 hours.   ABG Recent Labs  Lab 02/12/18 2029  02/13/18 1131 02/15/18 0530 02/15/18 1228 02/15/18 1609  PHART  --    < > 7.333* 7.240* 7.250* 7.368  PCO2ART  --    < > 67.8* 105* 102* 73.1*  PO2ART  --    < > 90.0 65.3* 275* 234.0*  HCO3  --    < > 36.0* 43.2* 43.0* 42.0*  TCO2 38*  --  38*  --   --  44*   < > = values in this interval not displayed.   CBG trend Recent Labs  Lab 02/16/18 1159 02/16/18 1549 02/16/18 2009 02/17/18 0017 02/17/18 0406  GLUCAP 206* 182* 198* 193* 229*   DIAGNOSES: Active Problems:   Sepsis ([place])   Acute respiratory failure with hypoxia and hypercapnia (HCC)   Malnutrition of moderate degree   Palliative care by specialist  ASSESSMENT / PLAN: Patient is a 67 y.o female with COPD and a history of MRSA pneumonia requiring tracheostomy placement in April 2018 and HFpEF who presented with acute on chronic hypercarbic respiratory failure and septic shock.   PULMONARY A: H/o COPD (No PFTs on file but CT showing severe centrilobular emphysema) H/o of tracheostomy (Placed in April 2018, now removed) Acute on Chronic Hypercarbic Respiratory Failure (HCO3 baseline 32, lives with a pCO2 around 60-65) Acute COPD exacerbation 2/2 Metapneumonvirus   P:   Continue Solumedrol 40 mg BID (Day #5) Finished 3 day course of azithromycin on 4/18 Continue scheduled breathing treatments (pulmicort and duonebs) Limit central depressing medications (benzos).  Will attempt SBT  INFECTIOUS A: Acute COPD exacerbation 2/2 Metapneumonvirus   P:   Trend fever curve and WBC Finished 3 day course of azithromycin on 4/18  CARDIOVASCULAR A: H/o HFpEF (2016; LVEF 55-60%  with hypokinesis of the midinferolateral and inferior myocardium, elevated PA pressures) H/o NSTEMI (2016; left heart cath without significant CAD) Sinus Tachycardia  Shock   P:  Continue metoprolol 5 mg every 6 hours   RENAL A:  Acute Kidney Injury  Likely pre-renal in setting of sepsis   P:   Continue to trend creatinine.  Monitor urine output.   GASTROINTESTINAL A: Nutrition  GI ppx  P:   Continue tube feeds.  Continue GI ppx   HEMATOLOGIC A: Iron Deficiency Anemia   P:  Monitor  CBC  ENDOCRINE A: Normal AM cortisol (Cortisol 4.2 at 01:37) Hyperglycemia 2/2 steroids  P:   SSI every 4 hours while on tube feeds.  NEUROLOGIC A:  Sedated  Anxiety   P:   Continue Klonopin 0.25 mg BID Continue Zoloft 25 mg QD  Pulmonary and Critical Care Medicine [facility] Pager: [phone]  02/17/2018, 7:36 AM

## 2018-02-17 NOTE — Progress Notes (Signed)
IV therapy here, attempting to get peripheral IV so that CVC may be discontinued.  Tried 4x, unsuccessful. Will attempt again tomorrow

## 2018-02-18 ENCOUNTER — Inpatient Hospital Stay (HOSPITAL_COMMUNITY): Payer: Medicare Other

## 2018-02-18 LAB — GLUCOSE, CAPILLARY
GLUCOSE-CAPILLARY: 102 mg/dL — AB (ref 65–99)
Glucose-Capillary: 223 mg/dL — ABNORMAL HIGH (ref 65–99)
Glucose-Capillary: 166 mg/dL — ABNORMAL HIGH (ref 65–99)
Glucose-Capillary: 133 mg/dL — ABNORMAL HIGH (ref 65–99)
Glucose-Capillary: 152 mg/dL — ABNORMAL HIGH (ref 65–99)

## 2018-02-18 LAB — BASIC METABOLIC PANEL
ANION GAP: 7 (ref 5–15)
BUN: 32 mg/dL — ABNORMAL HIGH (ref 6–20)
CALCIUM: 8.7 mg/dL — AB (ref 8.9–10.3)
CO2: 35 mmol/L — AB (ref 22–32)
CREATININE: 0.7 mg/dL (ref 0.44–1.00)
Chloride: 98 mmol/L — ABNORMAL LOW (ref 101–111)
GFR calc Af Amer: >60 mL/min (ref >60)
Glucose, Bld: 250 mg/dL — ABNORMAL HIGH (ref 65–99)
Potassium: 4.2 mmol/L (ref 3.5–5.1)
SODIUM: 140 mmol/L (ref 135–145)

## 2018-02-18 LAB — CBC
HCT: 33.1 % — ABNORMAL LOW (ref 36.0–46.0)
Hemoglobin: 10 g/dL — ABNORMAL LOW (ref 12.0–15.0)
MCH: 29.9 pg (ref 26.0–34.0)
MCHC: 30.2 g/dL (ref 30.0–36.0)
MCV: 99.1 fL (ref 78.0–100.0)
Platelets: 240 10*3/uL (ref 150–400)
RBC: 3.34 MIL/uL — ABNORMAL LOW (ref 3.87–5.11)
RDW: 13.8 % (ref 11.5–15.5)
WBC: 5 10*3/uL (ref 4.0–10.5)

## 2018-02-18 MED ORDER — PREDNISONE 5 MG/5ML PO SOLN
30.0000 mg | Freq: Every day | ORAL | Status: DC
  Administered 2018-02-19 – 2018-02-20 (×2): 30 mg
  Filled 2018-02-18 (×3): qty 30

## 2018-02-18 NOTE — Progress Notes (Signed)
PULMONARY  / CRITICAL CARE MEDICINE  Name: Megan [name] MRN: [id number] DOB: 09/28/1951    LOS: 29  REFERRING MD :  EDP  CHIEF COMPLAINT:  SOB  BRIEF PATIENT DESCRIPTION: Patient is a 67 y.o female with COPD and a history of MRSA pneumonia requiring tracheostomy placement in April 2018 and HFpEF who presented with progressive dyspnea of 2 months duration. She was initially seen by her PCP in March and treated for an acute COPD exacerbation with prednisone and doxycycline; however, failed to improve. She then presented to her pulmonologist (Dr. Elsworth Soho) on 02/07/18 and again treated for a COPD exacerbation with an extended duration of prednisone and doxycycline. She failed to improve and presented to the ED. In the ED she was found to have worsening acute on chronic hypercarbic respiratory failure with a trial of BiPAP and was subsequently intubated. She was in septic shock requiring pressor support and PCCM was called for further management.   LINES / TUBES: Right IJ CVC 4/14  Endotracheal tube 4/17  CULTURES: 4/14: Blood cultures with no growth to date 4/14: Tracheal aspirate no growth to date  4/14: Respiratory Viral Panel positive for metapneumovirus   ANTIBIOTICS: Vancomycin 4/14 -> Discontinued on 4/15  Levofloxacin 4/14 -> Discontinued on 4/16  Azithromycin 4/16 -> Finished on 4/18  SIGNIFICANT EVENTS:  March treated for COPD exacerbation with Prednisone and Doxycycline >>> Again treated for COPD exacerbation with Prednisone and Doxyccyline 02/07/18 >>> Acute on Chronic Hypercarbic Respiratory Failure requiring intubation 02/12/18 >>> Extubated on 02/14/18 >>> Re-intubated for acute on chronic hypercarbic respiratory failure 02/15/18 >>>  INTERVAL HISTORY: Clonazepam adjusted and Xanax added back on 4/19 Still with intermittent agitation Tolerating pressure support ventilation this morning with some associated tachypnea   VITAL SIGNS: Temp:  [97.2 F (36.2 C)-98 F (36.7 C)] 97.6  F (36.4 C) (04/20 0755) Pulse Rate:  [77-126] 104 (04/20 1030) Resp:  [17-33] 23 (04/20 1030) BP: (88-137)/(65-105) 128/67 (04/20 1030) SpO2:  [91 %-100 %] 100 % (04/20 1030) FiO2 (%):  [30 %-40 %] 40 % (04/20 0800) Weight:  [40.3 kg (88 lb 13.5 oz)] 40.3 kg (88 lb 13.5 oz) (04/20 0500)  VENTILATOR SETTINGS: Vent Mode: CPAP;PSV FiO2 (%):  [30 %-40 %] 40 % Set Rate:  [18 bmp] 18 bmp Vt Set:  [410 mL] 410 mL PEEP:  [5 cmH20] 5 cmH20 Pressure Support:  [5 cmH20-10 cmH20] 5 cmH20 Plateau Pressure:  [21 cmH20-24 cmH20] 24 cmH20  INTAKE / OUTPUT: Intake/Output      04/19 0701 - 04/20 0700 04/20 0701 - 04/21 0700   I.V. (mL/kg) 321.6 (8) 66.8 (1.7)   Other 20    NG/GT 1425 180   IV Piggyback 50    Total Intake(mL/kg) 1816.6 (45.1) 246.8 (6.1)   Urine (mL/kg/hr) 650 (0.7) 250 (1.7)   Stool 0    Total Output 650 250   Net +1166.6 -3.2        Urine Occurrence 6 x    Stool Occurrence 3 x     PHYSICAL EXAMINATION:  General: Cachectic elderly woman, mechanically ventilated Neuro: Awake, alert, anxious, moves all extremities with good strength HEENT: ET tube in good place, no oral lesions Cardiovascular: Tachycardic, regular, no murmur Lungs: Very distant, I do not hear any wheezing today Abdomen: Soft, nondistended, positive bowel sounds Musculoskeletal: No deformities, no edema Skin: No rash  LABS: Cbc Recent Labs  Lab 02/16/18 0344 02/17/18 0503 02/18/18 0410  WBC 7.3 6.3 5.0  HGB 10.0* 9.8* 10.0*  HCT  33.5* 31.7* 33.1*  PLT 284 225 240   Chemistry Recent Labs  Lab 02/13/18 1324 02/14/18 0410  02/16/18 0344 02/17/18 0503 02/18/18 0410  NA  --  139   < > 142 140 140  K  --  3.9   < > 3.9 3.4* 4.2  CL  --  98*   < > 96* 94* 98*  CO2  --  33*   < > 38* 37* 35*  BUN  --  21*   < > 47* 29* 32*  CREATININE  --  0.92   < > 1.02* 0.66 0.70  CALCIUM  --  8.4*   < > 8.4* 8.4* 8.7*  MG 1.6* 1.8  --   --   --   --   PHOS 3.0 3.0  --   --   --   --   GLUCOSE  --   157*   < > 245* 252* 250*   < > = values in this interval not displayed.   Liver fxn No results for input(s): AST, ALT, ALKPHOS, BILITOT, PROT, ALBUMIN in the last 168 hours.   coags No results for input(s): APTT, INR in the last 168 hours.   Sepsis markers Recent Labs  Lab 02/12/18 2036 02/13/18 0136 02/13/18 1349  LATICACIDVEN 2.11*  --  0.9  PROCALCITON  --  0.50  --    Cardiac markers No results for input(s): CKTOTAL, CKMB, TROPONINI in the last 168 hours.   BNP No results for input(s): PROBNP in the last 168 hours.   ABG Recent Labs  Lab 02/12/18 2029  02/13/18 1131 02/15/18 0530 02/15/18 1228 02/15/18 1609  PHART  --    < > 7.333* 7.240* 7.250* 7.368  PCO2ART  --    < > 67.8* 105* 102* 73.1*  PO2ART  --    < > 90.0 65.3* 275* 234.0*  HCO3  --    < > 36.0* 43.2* 43.0* 42.0*  TCO2 38*  --  38*  --   --  44*   < > = values in this interval not displayed.   CBG trend Recent Labs  Lab 02/17/18 1517 02/17/18 1946 02/17/18 2332 02/18/18 0348 02/18/18 0810  GLUCAP 184* 151* 168* 223* 166*   DIAGNOSES: Active Problems:   Sepsis (HCC)   Acute respiratory failure with hypoxia and hypercapnia (HCC)   Malnutrition of moderate degree   Palliative care by specialist  ASSESSMENT / PLAN: Patient is a 67 y.o female with COPD and a history of MRSA pneumonia requiring tracheostomy placement in April 2018 and HFpEF who presented with acute on chronic hypercarbic respiratory failure and septic shock.   PULMONARY A: H/o COPD (No PFTs on file but CT showing severe centrilobular emphysema) H/o of tracheostomy (Placed in April 2018, now removed) Acute on Chronic Hypercarbic Respiratory Failure (HCO3 baseline 32, lives with a pCO2 around 60-65) Acute COPD exacerbation 2/2 Metapneumonvirus   P:   Convert Solu-Medrol to prednisone per tube 4/20 Course of azithromycin completed Scheduled Pulmicort, DuoNeb Continue with spontaneous breathing trials.  She is marginal for  extubation, there is some contribution of her anxiety.  Interest she looks almost exactly like she did the first time I extubated her.  Unclear to me whether she will be able to tolerate the work of breathing for any extended period of time.  She is likely performing well enough that she deserves a chance for an extubation for success.  If she fails then we would reintubate and  plan for tracheostomy and longer-term ventilation based on her goals for care.  INFECTIOUS A: Acute COPD exacerbation 2/2 Metapneumonvirus   P:   Following off of antibiotics  CARDIOVASCULAR A: H/o HFpEF (2016; LVEF 55-60% with hypokinesis of the midinferolateral and inferior myocardium, elevated PA pressures) H/o NSTEMI (2016; left heart cath without significant CAD) Sinus Tachycardia  Shock   P:  Scheduled metoprolol  RENAL A:  Acute Kidney Injury  Likely pre-renal in setting of sepsis   P:   Urine output, BMP Replace left lites as indicated  GASTROINTESTINAL A: Nutrition  GI ppx  P:   Continue tube feeds.  Continue GI ppx   HEMATOLOGIC A: Iron Deficiency Anemia   P:  Follow CBC  ENDOCRINE A: Normal AM cortisol (Cortisol 4.2 at 01:37) Hyperglycemia 2/2 steroids  P:   Continue sliding-scale insulin as ordered  NEUROLOGIC A:  Sedated  Anxiety   P:   Clonazepam increased to 0.5 mg twice daily on 4/19, consider increase again on 4/21 Xanax added as needed Continue Zoloft 25 mg QD  Independent CC time 32 minutes  [name], MD, PhD 02/18/2018, 10:53 AM [place] [phone] or if no answer [phone]

## 2018-02-18 NOTE — Progress Notes (Deleted)
Pts Mag 1.4 this AM. Relayed to covering CCM NP

## 2018-02-18 NOTE — Progress Notes (Signed)
While weaning on SBT psv5, pt desat to 89-90% on 30% fio2.   Fio2 increased to 40%.

## 2018-02-19 LAB — TROPONIN I
TROPONIN I: 0.11 ng/mL — AB (ref <0.03)
TROPONIN I: 0.11 ng/mL — AB (ref <0.03)
Troponin I: 0.1 ng/mL (ref <0.03)

## 2018-02-19 LAB — GLUCOSE, CAPILLARY
GLUCOSE-CAPILLARY: 98 mg/dL (ref 65–99)
Glucose-Capillary: 127 mg/dL — ABNORMAL HIGH (ref 65–99)
Glucose-Capillary: 144 mg/dL — ABNORMAL HIGH (ref 65–99)
Glucose-Capillary: 146 mg/dL — ABNORMAL HIGH (ref 65–99)
Glucose-Capillary: 161 mg/dL — ABNORMAL HIGH (ref 65–99)
Glucose-Capillary: 171 mg/dL — ABNORMAL HIGH (ref 65–99)
Glucose-Capillary: 191 mg/dL — ABNORMAL HIGH (ref 65–99)

## 2018-02-19 LAB — BASIC METABOLIC PANEL
Anion gap: 8 (ref 5–15)
BUN: 39 mg/dL — AB (ref 6–20)
CHLORIDE: 104 mmol/L (ref 101–111)
CO2: 29 mmol/L (ref 22–32)
CREATININE: 0.74 mg/dL (ref 0.44–1.00)
Calcium: 7.5 mg/dL — ABNORMAL LOW (ref 8.9–10.3)
GFR calc Af Amer: >60 mL/min (ref >60)
GFR calc non Af Amer: >60 mL/min (ref >60)
Glucose, Bld: 179 mg/dL — ABNORMAL HIGH (ref 65–99)
Potassium: 3.7 mmol/L (ref 3.5–5.1)
Sodium: 141 mmol/L (ref 135–145)

## 2018-02-19 LAB — CBC
HEMATOCRIT: 28.5 % — AB (ref 36.0–46.0)
HEMOGLOBIN: 8.7 g/dL — AB (ref 12.0–15.0)
MCH: 30.6 pg (ref 26.0–34.0)
MCHC: 30.5 g/dL (ref 30.0–36.0)
MCV: 100.4 fL — ABNORMAL HIGH (ref 78.0–100.0)
Platelets: 203 10*3/uL (ref 150–400)
RBC: 2.84 MIL/uL — ABNORMAL LOW (ref 3.87–5.11)
RDW: 14 % (ref 11.5–15.5)
WBC: 6.5 10*3/uL (ref 4.0–10.5)

## 2018-02-19 MED ORDER — SODIUM CHLORIDE 0.9 % IV BOLUS
1000.0000 mL | Freq: Once | INTRAVENOUS | Status: AC
  Administered 2018-02-19: 1000 mL via INTRAVENOUS

## 2018-02-19 MED ORDER — ASPIRIN 81 MG PO CHEW
81.0000 mg | CHEWABLE_TABLET | Freq: Every day | ORAL | Status: DC
  Administered 2018-02-19 – 2018-02-21 (×3): 81 mg
  Filled 2018-02-19 (×3): qty 1

## 2018-02-19 MED ORDER — METOPROLOL TARTRATE 5 MG/5ML IV SOLN
2.5000 mg | Freq: Four times a day (QID) | INTRAVENOUS | Status: DC
  Administered 2018-02-19 – 2018-02-20 (×3): 2.5 mg via INTRAVENOUS
  Filled 2018-02-19 (×5): qty 5

## 2018-02-19 MED ORDER — NITROGLYCERIN 0.4 MG SL SUBL
0.4000 mg | SUBLINGUAL_TABLET | SUBLINGUAL | Status: DC | PRN
  Administered 2018-02-19 (×2): 0.4 mg via SUBLINGUAL
  Filled 2018-02-19 (×2): qty 1

## 2018-02-19 NOTE — Progress Notes (Signed)
Patient stats that her chest pain is getting better after one dose of Nitro, but still has pain. The second dose of nitro was given.

## 2018-02-19 NOTE — Progress Notes (Signed)
[place] Progress Note Patient Name: Megan [name] DOB: 1951-10-15 MRN: [id number]   Date of Service  02/19/2018  HPI/Events of Note  Multiple issues: 1. Hypotension - BP = 78/55. Precedex IV infusion now held and 2. Patient c/o chest pain - 12 Lead EKG --> NSR. No acute changes.   eICU Interventions  Will order: 1. Bolus with 0.9 NaCl 1 liter IV over 1 hour now.  2. Continue to hold Precedex IV infusion. 3. ASA 81 mg per tube now and Q day.  4. Cycle Troponin.      Intervention Category Major Interventions: Other:  [name] Cornelia Copa 02/19/2018, 4:55 AM

## 2018-02-19 NOTE — Progress Notes (Signed)
[place] Progress Note Patient Name: Megan [name] DOB: December 13, 1950 MRN: [id number]   Date of Service  02/19/2018  HPI/Events of Note  Still c/o chest pain. Troponin #1 = 0.11. EKG - NSR. No acute changes. Already on ASA. BP has improved to 137/43.  eICU Interventions  Will order: 1. Nitroglycerin 0.4 mg SL Q 5 minutes PRN chest pain.  2. Continue to cycle Troponin.      Intervention Category Intermediate Interventions: Diagnostic test evaluation  Sommer,Steven Eugene 02/19/2018, 6:07 AM

## 2018-02-19 NOTE — Progress Notes (Signed)
PULMONARY  / CRITICAL CARE MEDICINE  Name: Megan [name] MRN: [id number] DOB: Apr 09, 1951    LOS: 68  REFERRING MD :  EDP  CHIEF COMPLAINT:  SOB  BRIEF PATIENT DESCRIPTION: Patient is a 67 y.o female with COPD and a history of MRSA pneumonia requiring tracheostomy placement in April 2018 and HFpEF who presented with progressive dyspnea of 2 months duration. She was initially seen by her PCP in March and treated for an acute COPD exacerbation with prednisone and doxycycline; however, failed to improve. She then presented to her pulmonologist (Dr. Elsworth Soho) on 02/07/18 and again treated for a COPD exacerbation with an extended duration of prednisone and doxycycline. She failed to improve and presented to the ED. In the ED she was found to have worsening acute on chronic hypercarbic respiratory failure with a trial of BiPAP and was subsequently intubated. She was in septic shock requiring pressor support and PCCM was called for further management.   LINES / TUBES: Right IJ CVC 4/14  Endotracheal tube 4/17  CULTURES: 4/14: Blood cultures with no growth to date 4/14: Tracheal aspirate no growth to date  4/14: Respiratory Viral Panel positive for metapneumovirus   ANTIBIOTICS: Vancomycin 4/14 - 4/15  Levofloxacin 4/14 - 4/16  Azithromycin 4/16 - 4/18  SIGNIFICANT EVENTS:  March treated for COPD exacerbation with Prednisone and Doxycycline >>> Again treated for COPD exacerbation with Prednisone and Doxyccyline 02/07/18 >>> Acute on Chronic Hypercarbic Respiratory Failure requiring intubation 02/12/18 >>> Extubated on 02/14/18 >>> Re-intubated for acute on chronic hypercarbic respiratory failure 02/15/18 >>>  INTERVAL HISTORY: She developed some hypotension overnight after receiving IV metoprolol for tachycardia. IV fluids were given and metoprolol and precedex held for a while. She felt chest pressure during this episode and troponin and EKg checked showing NSR and 0.11. SL nitroglycerin was given x2.  Megan [name] felt this was coming from a long day yesterday and working hard to breathe and that it is now improved.   VITAL SIGNS: Temp:  [97.6 F (36.4 C)-99.2 F (37.3 C)] 99.2 F (37.3 C) (04/21 0757) Pulse Rate:  [77-126] 126 (04/21 0804) Resp:  [15-31] 28 (04/21 0804) BP: (90-143)/(43-87) 143/87 (04/21 0800) SpO2:  [88 %-100 %] 96 % (04/21 0804) FiO2 (%):  [40 %] 40 % (04/21 0804) Weight:  [93 lb 11.1 oz (42.5 kg)] 93 lb 11.1 oz (42.5 kg) (04/21 0500)  VENTILATOR SETTINGS: Vent Mode: PRVC FiO2 (%):  [40 %] 40 % Set Rate:  [18 bmp] 18 bmp Vt Set:  [410 mL] 410 mL PEEP:  [5 cmH20] 5 cmH20 Pressure Support:  [12 cmH20] 12 cmH20 Plateau Pressure:  [21 cmH20-22 cmH20] 21 cmH20  INTAKE / OUTPUT: Intake/Output      04/20 0701 - 04/21 0700 04/21 0701 - 04/22 0700   I.V. (mL/kg) 261.7 (6.2)    Other     NG/GT 1165 45   IV Piggyback 820.5    Total Intake(mL/kg) 2247.1 (52.9) 45 (1.1)   Urine (mL/kg/hr) 710 (0.7)    Emesis/NG output 200    Stool     Total Output 910    Net +1337.1 +45        Stool Occurrence  1 x    PHYSICAL EXAMINATION:  General: Cachectic elderly woman, mechanically ventilated Neuro: Awake, anxious, moves all extremities with good strength HEENT: ETT in good position, NG tube Cardiovascular: Tachycardic, regular, no murmur Lungs: Distant breath sounds, no wheezes Abdomen: Soft, nondistended,bowel sounds present Musculoskeletal: No edema Skin: No rash, few scattered ecchymoses  on forearms  LABS: Cbc Recent Labs  Lab 02/17/18 0503 02/18/18 0410 02/19/18 0510  WBC 6.3 5.0 6.5  HGB 9.8* 10.0* 8.7*  HCT 31.7* 33.1* 28.5*  PLT 225 240 203   Chemistry Recent Labs  Lab 02/13/18 1324 02/14/18 0410  02/17/18 0503 02/18/18 0410 02/19/18 0510  NA  --  139   < > 140 140 141  K  --  3.9   < > 3.4* 4.2 3.7  CL  --  98*   < > 94* 98* 104  CO2  --  33*   < > 37* 35* 29  BUN  --  21*   < > 29* 32* 39*  CREATININE  --  0.92   < > 0.66 0.70 0.74   CALCIUM  --  8.4*   < > 8.4* 8.7* 7.5*  MG 1.6* 1.8  --   --   --   --   PHOS 3.0 3.0  --   --   --   --   GLUCOSE  --  157*   < > 252* 250* 179*   < > = values in this interval not displayed.   Liver fxn No results for input(s): AST, ALT, ALKPHOS, BILITOT, PROT, ALBUMIN in the last 168 hours.   coags No results for input(s): APTT, INR in the last 168 hours.   Sepsis markers Recent Labs  Lab 02/12/18 2036 02/13/18 0136 02/13/18 1349  LATICACIDVEN 2.11*  --  0.9  PROCALCITON  --  0.50  --    Cardiac markers Recent Labs  Lab 02/19/18 0510  TROPONINI 0.11*     BNP No results for input(s): PROBNP in the last 168 hours.   ABG Recent Labs  Lab 02/12/18 2029  02/13/18 1131 02/15/18 0530 02/15/18 1228 02/15/18 1609  PHART  --    < > 7.333* 7.240* 7.250* 7.368  PCO2ART  --    < > 67.8* 105* 102* 73.1*  PO2ART  --    < > 90.0 65.3* 275* 234.0*  HCO3  --    < > 36.0* 43.2* 43.0* 42.0*  TCO2 38*  --  38*  --   --  44*   < > = values in this interval not displayed.   CBG trend Recent Labs  Lab 02/18/18 1535 02/18/18 1948 02/18/18 2343 02/19/18 0408 02/19/18 0759  GLUCAP 152* 102* 127* 146* 98   DIAGNOSES: Active Problems:   Sepsis (HCC)   Acute respiratory failure with hypoxia and hypercapnia (HCC)   Malnutrition of moderate degree   Palliative care by specialist  ASSESSMENT / PLAN: Patient is a 67 y.o female with COPD and a history of MRSA pneumonia requiring tracheostomy placement in April 2018 and HFpEF who presented with acute on chronic hypercarbic respiratory failure and septic shock.   PULMONARY A: H/o COPD (No PFTs on file but CT showing severe centrilobular emphysema) H/o of tracheostomy (Placed in April 2018, now removed) Acute on Chronic Hypercarbic Respiratory Failure (HCO3 baseline 32, lives with a pCO2 around 60-65) Acute COPD exacerbation 2/2 Metapneumonvirus, finished azithromycin course P:   Continue prednisone Scheduled Pulmicort,  DuoNeb Continue SBT She currently appears anxious and very tachycardic. She was awake overnight with chest pain and hypotension and not clear how great of a candidate she is for extubation currently. If she fails then we would reintubate and plan for tracheostomy and longer-term ventilation based on her goals for care.  INFECTIOUS A: Acute COPD exacerbation 2/2 Metapneumonvirus  P:  Following off of antibiotics  CARDIOVASCULAR A: H/o HFpEF (2016; LVEF 55-60% with hypokinesis of the midinferolateral and inferior myocardium, elevated PA pressures) H/o NSTEMI (2016; left heart cath without significant CAD) Sinus Tachycardia  Hypotension overnight with chest pressure, troponin 0.11, EKG without ischemic changes, improved P:  Decreased metoprolol frequency Trending troponins  RENAL A: Acute Kidney Injury, corrected P:   F/U Bmets  GASTROINTESTINAL A: Nutrition  GI ppx P:   On TFs 45cc/hr  SUP  HEMATOLOGIC A: Iron Deficiency Anemia, stable  ENDOCRINE A: Normal AM cortisol (Cortisol 4.2 at 01:37) Hyperglycemia 2/2 steroids P:   Continue sliding-scale insulin as ordered  NEUROLOGIC A: Precedex sedation Anxiety  Chest pain episode overnight - reports feeling unable to breathe (on PRVC) Clonazepam increased to 0.5 mg twice daily on 4/19 P:  Still anxious, consider dose adjustments again today Xanax PRN Zoloft 25 mg QD   Collier Salina, MD PGY-III Internal Medicine Resident Pager# [phone] 02/19/2018, 8:56 AM

## 2018-02-19 NOTE — Progress Notes (Signed)
CRITICAL VALUE ALERT  Critical Value: Troponin   Date & Time Notied:  02/19/18 06:00  Provider Notified: Warren Lacy MD  Orders Received/Actions taken: To administer Nitroglycerin 0.4mg 

## 2018-02-19 NOTE — Progress Notes (Signed)
Attempted vent weaning SBT this morning.  Pt currently having bowel movement and very anxious currently- tachypnea, tachycardic.  Will attempt weaning later. Discussed w/ RN .

## 2018-02-19 NOTE — Progress Notes (Signed)
eLink Physician-Brief Progress Note Patient Name: Megan [name] DOB: 07-07-1951 MRN: [id number]   Date of Service  02/19/2018  HPI/Events of Note  Hypotension - BP decreased to 82/59 post Metoprolol 5 mg IV. Last LVEF = 55% to 60%.  eICU Interventions  Will order: 1. Bolus with 0.9 NaCl 1 liter IV over 1 hour now.  2. Decrease Metoprolol IV dose to 2.5 mg IV Q 6 hours. Hold for SBP < 110 or HR < 60.      Intervention Category Major Interventions: Hypotension - evaluation and management  Donnetta Gillin Eugene 02/19/2018, 12:53 AM

## 2018-02-19 NOTE — Progress Notes (Signed)
Patient was still having an event of hypotension, she also informed me that she has chest pain. Elink MD was notified about both issues. After the bolus, BP is better, but still has chest pain. 12 lead EKG showed normal sinus. Will continue to monitor.

## 2018-02-19 NOTE — Progress Notes (Signed)
Patient had an episode of hypotension after giving metoprolol. Elink MD initiated a fluid bolus. Patient's BP is getting better. Will continue to monitor.

## 2018-02-19 NOTE — Progress Notes (Signed)
Pt desaturated to 81% on vent weaning.  100% fio2 breaths given x 2 minutes, then fio2 at 40%, sat now 99%

## 2018-02-20 ENCOUNTER — Encounter (HOSPITAL_COMMUNITY): Payer: Self-pay

## 2018-02-20 ENCOUNTER — Inpatient Hospital Stay (HOSPITAL_COMMUNITY): Payer: Medicare Other

## 2018-02-20 LAB — BASIC METABOLIC PANEL
Anion gap: 5 (ref 5–15)
BUN: 35 mg/dL — AB (ref 6–20)
CALCIUM: 8.5 mg/dL — AB (ref 8.9–10.3)
CO2: 34 mmol/L — ABNORMAL HIGH (ref 22–32)
CREATININE: 0.65 mg/dL (ref 0.44–1.00)
Chloride: 99 mmol/L — ABNORMAL LOW (ref 101–111)
GFR calc Af Amer: >60 mL/min (ref >60)
Glucose, Bld: 176 mg/dL — ABNORMAL HIGH (ref 65–99)
Potassium: 3.7 mmol/L (ref 3.5–5.1)
SODIUM: 138 mmol/L (ref 135–145)

## 2018-02-20 LAB — GLUCOSE, CAPILLARY
GLUCOSE-CAPILLARY: 133 mg/dL — AB (ref 65–99)
Glucose-Capillary: 170 mg/dL — ABNORMAL HIGH (ref 65–99)
Glucose-Capillary: 114 mg/dL — ABNORMAL HIGH (ref 65–99)
Glucose-Capillary: 112 mg/dL — ABNORMAL HIGH (ref 65–99)
Glucose-Capillary: 123 mg/dL — ABNORMAL HIGH (ref 65–99)

## 2018-02-20 LAB — CBC
HCT: 28.7 % — ABNORMAL LOW (ref 36.0–46.0)
Hemoglobin: 8.9 g/dL — ABNORMAL LOW (ref 12.0–15.0)
MCH: 30.8 pg (ref 26.0–34.0)
MCHC: 31 g/dL (ref 30.0–36.0)
MCV: 99.3 fL (ref 78.0–100.0)
PLATELETS: 247 10*3/uL (ref 150–400)
RBC: 2.89 MIL/uL — AB (ref 3.87–5.11)
RDW: 14.2 % (ref 11.5–15.5)
WBC: 7.8 10*3/uL (ref 4.0–10.5)

## 2018-02-20 MED ORDER — CHLORHEXIDINE GLUCONATE 0.12 % MT SOLN: 15.0000 mL | Freq: Two times a day (BID) | OROMUCOSAL | Status: DC

## 2018-02-20 MED ORDER — ORAL CARE MOUTH RINSE: 15.0000 mL | Freq: Two times a day (BID) | OROMUCOSAL | Status: DC

## 2018-02-20 MED ORDER — METOPROLOL TARTRATE 5 MG/5ML IV SOLN: 2.5000 mg | Freq: Four times a day (QID) | INTRAVENOUS | Status: DC | PRN

## 2018-02-20 MED ORDER — DILTIAZEM HCL 60 MG PO TABS
30.0000 mg | ORAL_TABLET | Freq: Three times a day (TID) | ORAL | Status: DC
  Administered 2018-02-20 – 2018-02-26 (×16): 30 mg via ORAL
  Filled 2018-02-20 (×19): qty 1

## 2018-02-20 MED ORDER — SODIUM CHLORIDE 0.9 % IV BOLUS
1000.0000 mL | Freq: Once | INTRAVENOUS | Status: AC
  Administered 2018-02-20: 1000 mL via INTRAVENOUS

## 2018-02-20 NOTE — Procedures (Signed)
Extubation Procedure Note  Patient Details:   Name: Megan [name] DOB: 11/25/1950 MRN: [id number]   Airway Documentation:    Vent end date: 02/14/18 Vent end time: 1438   Evaluation  O2 sats: stable throughout Complications: No apparent complications Patient did tolerate procedure well. Bilateral Breath Sounds: Clear, Diminished   Yes   Patient extubated to Bipap per MD.  Positive cuff leak was noted.  No evidence of stridor prior to being placed on bipap.  Sats currently 100%.  Patient is currently tolerating bipap well.  Plan is to leave patient on bipap until AM.  Will continue to monitor.    Philomena Doheny 02/20/2018, 2:15 PM

## 2018-02-20 NOTE — Progress Notes (Signed)
eLink Physician-Brief Progress Note Patient Name: Megan [name] DOB: Jan 16, 1951 MRN: [id number]   Date of Service  02/20/2018  HPI/Events of Note  Looks stable on BiPAP at this time.   eICU Interventions       Intervention Category Intermediate Interventions: Other:  Collene Gobble 02/20/2018, 9:41 PM

## 2018-02-20 NOTE — Progress Notes (Signed)
Patient is on contious Bipap after being extubated earlier this afternoon. Dr. Posey Pronto was called about her oral medications and suggested to hold them, and not take her off of the Bipap for a period of time due to her previous unstable respiratory events. She is currently resting calmly with stable VS.

## 2018-02-20 NOTE — Progress Notes (Signed)
PULMONARY  / CRITICAL CARE MEDICINE  Name: Megan [name] MRN: [id number] DOB: [birth year]    LOS: 53  REFERRING MD :  EDP  CHIEF COMPLAINT:  SOB  BRIEF PATIENT DESCRIPTION:  Patient is a 67 y.o female with COPD and a history of MRSA pneumonia requiring tracheostomy placement in April 2018 and HFpEF who presented with progressive dyspnea of 2 months duration. She was initially seen by her PCP in March and treated for an acute COPD exacerbation with prednisone and doxycycline; however, failed to improve. She then presented to her pulmonologist (Dr. Elsworth [name]) on 02/07/18 and again treated for a COPD exacerbation with an extended duration of prednisone and doxycycline. She failed to improve and presented to the ED. In the ED she was found to have worsening acute on chronic hypercarbic respiratory failure with a trial of BiPAP and was subsequently intubated. She was in septic shock requiring pressor support and PCCM was called for further management.   LINES / TUBES: Right IJ CVC 4/14  Endotracheal tube 4/14> 4/16, re-intubated 4/17  CULTURES: 4/14: Blood cultures with no growth to date 4/14: Tracheal aspirate no growth to date  4/14: Respiratory Viral Panel positive for metapneumovirus   ANTIBIOTICS: Vancomycin 4/14 - 4/15  Levofloxacin 4/14 - 4/16  Azithromycin 4/16 - 4/18  SIGNIFICANT EVENTS:  March treated for COPD exacerbation with Prednisone and Doxycycline >>> Again treated for COPD exacerbation with Prednisone and Doxyccyline 02/07/18 >>> Acute on Chronic Hypercarbic Respiratory Failure requiring intubation 02/12/18 >>> Extubated on 02/14/18 >>> Re-intubated for acute on chronic hypercarbic respiratory failure 02/15/18 >>>  INTERVAL HISTORY: She did well overnight without any issues. Her metoprolol was held yesterday for hypotension, BP has been stable when able to get a good cuff read. She is awake and following commands this morning. Doing well on SBT and wishes to have ETT removed.     VITAL SIGNS: Temp:  [98.1 F (36.7 C)-99.1 F (37.3 C)] 98.1 F (36.7 C) (04/22 0738) Pulse Rate:  [46-147] 46 (04/22 0425) Resp:  [15-31] 15 (04/22 0630) BP: (62-188)/(50-86) 62/50 (04/22 0630) SpO2:  [80 %-100 %] 82 % (04/22 0425) FiO2 (%):  [30 %-40 %] 30 % (04/22 0400) Weight:  [95 lb 0.3 oz (43.1 kg)] 95 lb 0.3 oz (43.1 kg) (04/22 0420)  VENTILATOR SETTINGS: Vent Mode: PRVC FiO2 (%):  [30 %-40 %] 30 % Set Rate:  [18 bmp] 18 bmp Vt Set:  [410 mL] 410 mL PEEP:  [5 cmH20] 5 cmH20 Pressure Support:  [5 cmH20] 5 cmH20 Plateau Pressure:  [24 cmH20-27 cmH20] 27 cmH20  INTAKE / OUTPUT: Intake/Output      04/21 0701 - 04/22 0700 04/22 0701 - 04/23 0700   I.V. (mL/kg) 231.5 (5.4)    NG/GT 1035    IV Piggyback 50    Total Intake(mL/kg) 1316.5 (30.5)    Urine (mL/kg/hr) 750 (0.7)    Emesis/NG output     Stool 0    Total Output 750    Net +566.5         Stool Occurrence 1 x     PHYSICAL EXAMINATION:  General: Cachectic elderly woman, mechanically ventilated Neuro: Awake, moves all extremities with good strength HEENT: ETT in good position, NG tube Cardiovascular: Tachycardic, regular, no murmur Lungs: Distant breath sounds, no wheezes Abdomen: Soft, nondistended,bowel sounds present Musculoskeletal: No edema Skin: No rash, few scattered ecchymoses on forearms  LABS: Cbc Recent Labs  Lab 02/18/18 0410 02/19/18 0510 02/20/18 0603  WBC 5.0 6.5 7.8  HGB  10.0* 8.7* 8.9*  HCT 33.1* 28.5* 28.7*  PLT 240 203 247   Chemistry Recent Labs  Lab 02/13/18 1324 02/14/18 0410  02/18/18 0410 02/19/18 0510 02/20/18 0603  NA  --  139   < > 140 141 138  K  --  3.9   < > 4.2 3.7 3.7  CL  --  98*   < > 98* 104 99*  CO2  --  33*   < > 35* 29 34*  BUN  --  21*   < > 32* 39* 35*  CREATININE  --  0.92   < > 0.70 0.74 0.65  CALCIUM  --  8.4*   < > 8.7* 7.5* 8.5*  MG 1.6* 1.8  --   --   --   --   PHOS 3.0 3.0  --   --   --   --   GLUCOSE  --  157*   < > 250* 179* 176*    < > = values in this interval not displayed.   Liver fxn No results for input(s): AST, ALT, ALKPHOS, BILITOT, PROT, ALBUMIN in the last 168 hours.   coags No results for input(s): APTT, INR in the last 168 hours.   Sepsis markers Recent Labs  Lab 02/13/18 1349  LATICACIDVEN 0.9   Cardiac markers Recent Labs  Lab 02/19/18 0510 02/19/18 1026 02/19/18 1631  TROPONINI 0.11* 0.11* 0.10*     BNP No results for input(s): PROBNP in the last 168 hours.   ABG Recent Labs  Lab 02/13/18 1131 02/15/18 0530 02/15/18 1228 02/15/18 1609  PHART 7.333* 7.240* 7.250* 7.368  PCO2ART 67.8* 105* 102* 73.1*  PO2ART 90.0 65.3* 275* 234.0*  HCO3 36.0* 43.2* 43.0* 42.0*  TCO2 38*  --   --  44*   CBG trend Recent Labs  Lab 02/19/18 1622 02/19/18 1932 02/19/18 2352 02/20/18 0409 02/20/18 0737  GLUCAP 171* 144* 161* 133* 170*   DIAGNOSES: Active Problems:   Sepsis (HCC)   Acute respiratory failure with hypoxia and hypercapnia (HCC)   Malnutrition of moderate degree   Palliative care by specialist  ASSESSMENT / PLAN: Patient is a 67 y.o female with COPD and a history of MRSA pneumonia requiring tracheostomy placement in April 2018 and HFpEF who presented with acute on chronic hypercarbic respiratory failure and septic shock.   PULMONARY A: H/o COPD (No PFTs on file but CT showing severe centrilobular emphysema) H/o of tracheostomy (Placed in April 2018, now removed) Acute on Chronic Hypercarbic Respiratory Failure (HCO3 baseline 32, lives with a pCO2 around 60-65) Acute COPD exacerbation 2/2 Metapneumonvirus, finished azithromycin course  P:   Continue prednisone taper Scheduled Pulmicort, DuoNeb Continue SBT > plan for extubation this morning to BiPAP bridge; re-intubate if needed reintubate and plan for tracheostomy and longer-term ventilation based on her goals for care. If tolerates extubation, will need BiPAP qhs.   INFECTIOUS A: Acute COPD exacerbation 2/2  Metapneumonvirus  P:   Following off of antibiotics  CARDIOVASCULAR A: H/o HFpEF (2016; LVEF 55-60% with hypokinesis of the midinferolateral and inferior myocardium, elevated PA pressures) H/o NSTEMI (2016; left heart cath without significant CAD) Sinus Tachycardia  Hypotension overnight with chest pressure, troponin 0.11 > 0.1, EKG without ischemic changes, improved P:  Change metoprolol to prn D/C amlodipine Re-start home Cardizem at 60 mg tid  RENAL A: Acute Kidney Injury, corrected P:   F/U Bmets  GASTROINTESTINAL A: Nutrition  GI ppx P:   On TFs 45cc/hr  SUP  HEMATOLOGIC A: Iron Deficiency Anemia, stable  ENDOCRINE A: Normal AM cortisol (Cortisol 4.2 at 01:37) Hyperglycemia 2/2 steroids P:   Continue sliding-scale insulin as ordered  NEUROLOGIC A: Precedex sedation Anxiety  Chest pain episode overnight 4/20 - improved Clonazepam increased to 0.5 mg twice daily on 4/19 P:  Continue clonazepam and precedex Xanax PRN Zoloft 25 mg QD

## 2018-02-21 LAB — BASIC METABOLIC PANEL
Anion gap: 6 (ref 5–15)
BUN: 22 mg/dL — AB (ref 6–20)
CHLORIDE: 101 mmol/L (ref 101–111)
CO2: 35 mmol/L — ABNORMAL HIGH (ref 22–32)
Calcium: 8.4 mg/dL — ABNORMAL LOW (ref 8.9–10.3)
Creatinine, Ser: 0.54 mg/dL (ref 0.44–1.00)
GFR calc Af Amer: >60 mL/min (ref >60)
GFR calc non Af Amer: >60 mL/min (ref >60)
Glucose, Bld: 90 mg/dL (ref 65–99)
POTASSIUM: 3.9 mmol/L (ref 3.5–5.1)
SODIUM: 142 mmol/L (ref 135–145)

## 2018-02-21 LAB — CBC
HEMATOCRIT: 29.3 % — AB (ref 36.0–46.0)
HEMOGLOBIN: 8.8 g/dL — AB (ref 12.0–15.0)
MCH: 30.2 pg (ref 26.0–34.0)
MCHC: 30 g/dL (ref 30.0–36.0)
MCV: 100.7 fL — ABNORMAL HIGH (ref 78.0–100.0)
Platelets: 295 10*3/uL (ref 150–400)
RBC: 2.91 MIL/uL — AB (ref 3.87–5.11)
RDW: 13.7 % (ref 11.5–15.5)
WBC: 7.2 10*3/uL (ref 4.0–10.5)

## 2018-02-21 LAB — GLUCOSE, CAPILLARY
GLUCOSE-CAPILLARY: 84 mg/dL (ref 65–99)
GLUCOSE-CAPILLARY: 89 mg/dL (ref 65–99)
Glucose-Capillary: 76 mg/dL (ref 65–99)
Glucose-Capillary: 92 mg/dL (ref 65–99)
Glucose-Capillary: 98 mg/dL (ref 65–99)

## 2018-02-21 MED ORDER — ORAL CARE MOUTH RINSE
15.0000 mL | Freq: Two times a day (BID) | OROMUCOSAL | Status: DC
  Administered 2018-02-21: 15 mL via OROMUCOSAL

## 2018-02-21 MED ORDER — SERTRALINE HCL 25 MG PO TABS
25.0000 mg | ORAL_TABLET | Freq: Every day | ORAL | Status: DC
  Administered 2018-02-22 – 2018-02-27 (×6): 25 mg via ORAL
  Filled 2018-02-21 (×6): qty 1

## 2018-02-21 MED ORDER — POLYETHYLENE GLYCOL 3350 17 G PO PACK
17.0000 g | PACK | Freq: Every day | ORAL | Status: DC
  Administered 2018-02-24 – 2018-02-26 (×3): 17 g via ORAL
  Filled 2018-02-21 (×4): qty 1

## 2018-02-21 MED ORDER — ALPRAZOLAM 0.25 MG PO TABS
0.2500 mg | ORAL_TABLET | Freq: Two times a day (BID) | ORAL | Status: DC | PRN
Start: 2018-02-21 — End: 2018-02-27
  Administered 2018-02-21 – 2018-02-27 (×10): 0.25 mg via ORAL
  Filled 2018-02-21 (×10): qty 1

## 2018-02-21 MED ORDER — ASPIRIN 81 MG PO CHEW
81.0000 mg | CHEWABLE_TABLET | Freq: Every day | ORAL | Status: DC
  Administered 2018-02-22 – 2018-02-27 (×6): 81 mg via ORAL
  Filled 2018-02-21 (×6): qty 1

## 2018-02-21 MED ORDER — CLONAZEPAM 0.25 MG PO TBDP
0.5000 mg | ORAL_TABLET | Freq: Two times a day (BID) | ORAL | Status: AC
  Administered 2018-02-21 – 2018-02-22 (×2): 0.5 mg via ORAL
  Filled 2018-02-21 (×2): qty 2

## 2018-02-21 MED ORDER — PREDNISONE 5 MG/5ML PO SOLN
30.0000 mg | Freq: Every day | ORAL | Status: DC
  Administered 2018-02-22: 30 mg via ORAL
  Filled 2018-02-21 (×2): qty 30

## 2018-02-21 MED ORDER — IPRATROPIUM-ALBUTEROL 0.5-2.5 (3) MG/3ML IN SOLN
3.0000 mL | Freq: Four times a day (QID) | RESPIRATORY_TRACT | Status: DC
  Administered 2018-02-22 (×2): 3 mL via RESPIRATORY_TRACT
  Filled 2018-02-21 (×2): qty 3

## 2018-02-21 MED ORDER — PANTOPRAZOLE SODIUM 40 MG PO TBEC
40.0000 mg | DELAYED_RELEASE_TABLET | Freq: Every day | ORAL | Status: DC
  Administered 2018-02-22 – 2018-02-27 (×6): 40 mg via ORAL
  Filled 2018-02-21 (×6): qty 1

## 2018-02-21 MED ORDER — ACETAMINOPHEN 325 MG PO TABS
650.0000 mg | ORAL_TABLET | Freq: Four times a day (QID) | ORAL | Status: DC | PRN
  Administered 2018-02-21 – 2018-02-27 (×6): 650 mg via ORAL
  Filled 2018-02-21 (×7): qty 2

## 2018-02-21 MED ORDER — ALPRAZOLAM 0.5 MG PO TABS: 0.5000 mg | ORAL_TABLET | Freq: Two times a day (BID) | ORAL | Status: DC | PRN

## 2018-02-21 NOTE — Progress Notes (Signed)
PULMONARY  / CRITICAL CARE MEDICINE  Name: Megan [name] MRN: [id number] DOB: 05-Jul-1951    LOS: 104  REFERRING MD :  EDP  CHIEF COMPLAINT:  SOB  BRIEF PATIENT DESCRIPTION:  Patient is a 67 y.o female with COPD and a history of MRSA pneumonia requiring tracheostomy placement in April 2018 and HFpEF who presented with progressive dyspnea of 2 months duration. She was initially seen by her PCP in March and treated for an acute COPD exacerbation with prednisone and doxycycline; however, failed to improve. She then presented to her pulmonologist (Dr. Elsworth Soho) on 02/07/18 and again treated for a COPD exacerbation with an extended duration of prednisone and doxycycline. She failed to improve and presented to the ED. In the ED she was found to have worsening acute on chronic hypercarbic respiratory failure with a trial of BiPAP and was subsequently intubated. She was in septic shock requiring pressor support and PCCM was called for further management.   LINES / TUBES: Right IJ CVC 4/14  Endotracheal tube 4/14> 4/16, re-intubated 4/17 > 4/22 to BiPAP  CULTURES: 4/14: Blood cultures with no growth to date 4/14: Tracheal aspirate no growth to date  4/14: Respiratory Viral Panel positive for metapneumovirus   ANTIBIOTICS: Vancomycin 4/14 - 4/15  Levofloxacin 4/14 - 4/16  Azithromycin 4/16 - 4/18  SIGNIFICANT EVENTS:  March treated for COPD exacerbation with Prednisone and Doxycycline >>> Again treated for COPD exacerbation with Prednisone and Doxyccyline 02/07/18 >>> Acute on Chronic Hypercarbic Respiratory Failure requiring intubation 02/12/18 >>> Extubated on 02/14/18 >>> Re-intubated for acute on chronic hypercarbic respiratory failure 02/15/18 >>> Extubate to BiPAP 4/22  INTERVAL HISTORY: She did well overnight without any issues. Off the BiPAP this morning and doing well on 4L [place]. She voices no complaints.   VITAL SIGNS: Temp:  [97.6 F (36.4 C)-98.3 F (36.8 C)] 97.9 F (36.6 C) (04/23  1200) Pulse Rate:  [76-111] 83 (04/23 1100) Resp:  [14-32] 18 (04/23 1100) BP: (106-157)/(46-100) 131/63 (04/23 1100) SpO2:  [98 %-100 %] 100 % (04/23 1100) FiO2 (%):  [40 %] 40 % (04/23 0405) Weight:  [95 lb 7.4 oz (43.3 kg)] 95 lb 7.4 oz (43.3 kg) (04/23 0500)  VENTILATOR SETTINGS: Vent Mode: BIPAP FiO2 (%):  [40 %] 40 % Set Rate:  [8 bmp] 8 bmp PEEP:  [5 cmH20] 5 cmH20 Pressure Support:  [5 cmH20] 5 cmH20  INTAKE / OUTPUT: Intake/Output      04/22 0701 - 04/23 0700 04/23 0701 - 04/24 0700   I.V. (mL/kg) 394.8 (9.1) 33.8 (0.8)   Other 1050    NG/GT 360 0   IV Piggyback     Total Intake(mL/kg) 1804.8 (41.7) 33.8 (0.8)   Urine (mL/kg/hr) 1950 (1.9) 500 (2.2)   Stool     Total Output 1950 500   Net -145.2 -466.2        Urine Occurrence 1 x     PHYSICAL EXAMINATION:  General: Cachectic elderly woman, sitting upright in bed in no acute distress Neuro: Awake, moves all extremities with good strength HEENT: Normocephalic, atraumatic Cardiovascular: Tachycardic, regular, no murmur Lungs: CTAB, no wheezes Abdomen: Soft, nondistended, bowel sounds present Musculoskeletal: No edema Skin: No rash, few scattered ecchymoses on forearms  LABS: Cbc Recent Labs  Lab 02/19/18 0510 02/20/18 0603 02/21/18 0500  WBC 6.5 7.8 7.2  HGB 8.7* 8.9* 8.8*  HCT 28.5* 28.7* 29.3*  PLT 203 247 295   Chemistry Recent Labs  Lab 02/19/18 0510 02/20/18 0603 02/21/18 0500  NA 141  138 142  K 3.7 3.7 3.9  CL 104 99* 101  CO2 29 34* 35*  BUN 39* 35* 22*  CREATININE 0.74 0.65 0.54  CALCIUM 7.5* 8.5* 8.4*  GLUCOSE 179* 176* 90   Liver fxn No results for input(s): AST, ALT, ALKPHOS, BILITOT, PROT, ALBUMIN in the last 168 hours.   coags No results for input(s): APTT, INR in the last 168 hours.   Sepsis markers No results for input(s): LATICACIDVEN, PROCALCITON in the last 168 hours. Cardiac markers Recent Labs  Lab 02/19/18 0510 02/19/18 1026 02/19/18 1631  TROPONINI 0.11*  0.11* 0.10*     BNP No results for input(s): PROBNP in the last 168 hours.   ABG Recent Labs  Lab 02/15/18 0530 02/15/18 1228 02/15/18 1609  PHART 7.240* 7.250* 7.368  PCO2ART 105* 102* 73.1*  PO2ART 65.3* 275* 234.0*  HCO3 43.2* 43.0* 42.0*  TCO2  --   --  44*   CBG trend Recent Labs  Lab 02/20/18 1540 02/20/18 1958 02/21/18 0007 02/21/18 0440 02/21/18 0814  GLUCAP 112* 123* 84 89 76   DIAGNOSES: Active Problems:   Sepsis (HCC)   Acute respiratory failure with hypoxia and hypercapnia (HCC)   Malnutrition of moderate degree   Palliative care by specialist  ASSESSMENT / PLAN: Patient is a 67 y.o female with COPD and a history of MRSA pneumonia requiring tracheostomy placement in April 2018 and HFpEF who presented with acute on chronic hypercarbic respiratory failure and septic shock.   PULMONARY A: H/o COPD (No PFTs on file but CT showing severe centrilobular emphysema) H/o of tracheostomy (Placed in April 2018, now removed) Acute on Chronic Hypercarbic Respiratory Failure (HCO3 baseline 32, lives with a pCO2 around 60-65) Acute COPD exacerbation 2/2 Metapneumonvirus, finished azithromycin course  P:   Continue prednisone taper Scheduled Pulmicort, DuoNeb BiPAP qhs  INFECTIOUS A: Acute COPD exacerbation 2/2 Metapneumonvirus  P:   Following off of antibiotics  CARDIOVASCULAR A: H/o HFpEF (2016; LVEF 55-60% with hypokinesis of the midinferolateral and inferior myocardium, elevated PA pressures) H/o NSTEMI (2016; left heart cath without significant CAD) Sinus Tachycardia  Hypotension overnight with chest pressure, troponin 0.11 > 0.1, EKG without ischemic changes, improved P:  Change metoprolol to prn D/C amlodipine Re-start home Cardizem at 60 mg tid  RENAL A: Acute Kidney Injury, corrected P:   F/U Bmets  GASTROINTESTINAL A: Nutrition - SLP eval GI ppx P:   Diet pending SLP evalution Protonix daily  HEMATOLOGIC A: Iron Deficiency Anemia,  stable  ENDOCRINE A: Normal AM cortisol (Cortisol 4.2 at 01:37) Hyperglycemia 2/2 steroids P:   Continue sliding-scale insulin as ordered  NEUROLOGIC A: Anxiety  Chest pain episode overnight 4/20 - improved  P:  D/C clonazepam and precedex Home Xanax PRN Zoloft 25 mg QD  Updates: No family at bedside

## 2018-02-21 NOTE — Progress Notes (Signed)
Upon arrival to patient room to do AM assessment and give AM nebulizer treatment, RN had taken patient off of bipap and was performing mouth care on patient.  Left patient off of bipap and placed on 4L nasal cannula and given AM treatment.  Patient currently tolerating cannula well.  Sats at 100%.  Will continue to monitor and assess for bipap needs.

## 2018-02-21 NOTE — Evaluation (Signed)
Clinical/Bedside Swallow Evaluation Patient Details  Name: Megan [name] MRN: [id number] Date of Birth: 1951-04-23  Today's Date: 02/21/2018 Time: SLP Start Time (ACUTE ONLY): 0850 SLP Stop Time (ACUTE ONLY): 0914 SLP Time Calculation (min) (ACUTE ONLY): 24 min  Past Medical History:  Past Medical History:  Diagnosis Date  . Anemia   . Anxiety state 08/20/2015  . CAP (community acquired pneumonia)   . CHF (congestive heart failure) ([place])   . COPD (chronic obstructive pulmonary disease) ([place])   . Depression   . Essential hypertension 08/19/2015  . GERD (gastroesophageal reflux disease)   . Headache   . History of hiatal hernia   . Myocardial infarction ([place])   . Shortness of breath dyspnea    Past Surgical History:  Past Surgical History:  Procedure Laterality Date  . CARDIAC CATHETERIZATION    . CARDIAC CATHETERIZATION N/A 08/26/2015   Procedure: Left Heart Cath and Coronary Angiography;  Surgeon: Jettie Booze, MD;  Location: [place] [facility];  Service: Cardiovascular;  Laterality: N/A;   HPI:  Patient is a 67 y.o female with COPD and a history of MRSA pneumonia requiring tracheostomy placement in April 2018 and HFpEF who presented with acute on chronic hypercarbic respiratory failure and septic shock. Pt was intubated 4/14-4/16 and then reintubated 4/17-4/22. PMH also includes MI, GERD, HTN, depression, anxiety, CAP. Pt was seen by SLP for swallowing in April 2017 with recommendations for Dys 3 diet and thin liquids. In May 2018 she was seen for PMV trials with limited tolerance, therefore swallowing was not assessed. MBS was completed while pt was at [facility] with report not available.   Assessment / Plan / Recommendation Clinical Impression  Pt has multiple subswallows across all textures and mildly prolonged mastication with solids, but she has no overt s/s of aspiration even when challenged with the 3 ounce water screen. Her voice s/p extubation is deep and she reports  mildly soft, but the quality is clear and her volitional cough appears strong. Pt does also have subjective c/o needing to burp and feels full quickly, attributing this to her reflux. Although report from prior imaging is not available, SLP reviewed imaging, with good airway protection observed. Recommend starting thin liquids and Dys 3 diet for energy conservation. Education was provided about esophageal and aspiration precautions, as well as the need for breaks PRN should she become short of breath. Will f/u for diet tolerance given lengthy intubation and premorbid risk factors (COPD, GERD).  SLP Visit Diagnosis: Dysphagia, unspecified (R13.10)    Aspiration Risk  Mild aspiration risk    Diet Recommendation Dysphagia 3 (Mech soft);Thin liquid   Liquid Administration via: Cup;Straw Medication Administration: Whole meds with liquid Supervision: Patient able to self feed;Intermittent supervision to cue for compensatory strategies Compensations: Slow rate;Small sips/bites Postural Changes: Seated upright at 90 degrees;Remain upright for at least 30 minutes after po intake    Other  Recommendations Oral Care Recommendations: Oral care BID   Follow up Recommendations (tba - none anticipated)      Frequency and Duration min 2x/week  2 weeks       Prognosis Prognosis for Safe Diet Advancement: Good      Swallow Study   General HPI: Patient is a 67 y.o female with COPD and a history of MRSA pneumonia requiring tracheostomy placement in April 2018 and HFpEF who presented with acute on chronic hypercarbic respiratory failure and septic shock. Pt was intubated 4/14-4/16 and then reintubated 4/17-4/22. PMH also includes MI, GERD, HTN, depression,  anxiety, CAP. Pt was seen by SLP for swallowing in April 2017 with recommendations for Dys 3 diet and thin liquids. In May 2018 she was seen for PMV trials with limited tolerance, therefore swallowing was not assessed. MBS was completed while pt was at  [facility] with report not available. Type of Study: Bedside Swallow Evaluation Previous Swallow Assessment: see HPI Diet Prior to this Study: NPO Temperature Spikes Noted: No Respiratory Status: Nasal cannula History of Recent Intubation: Yes Length of Intubations (days): 9 days(across 2 intubations) Date extubated: 02/20/18 Behavior/Cognition: Alert;Cooperative;Pleasant mood Oral Cavity Assessment: Within Functional Limits Oral Care Completed by SLP: Recent completion by staff Oral Cavity - Dentition: Dentures, top;Dentures, bottom Vision: Functional for self-feeding Self-Feeding Abilities: Able to feed self Patient Positioning: Upright in bed Baseline Vocal Quality: Other (comment)(deep, mildly soft per pt) Volitional Cough: Strong;Congested Volitional Swallow: Able to elicit    Oral/Motor/Sensory Function Overall Oral Motor/Sensory Function: Within functional limits   Ice Chips Ice chips: Impaired Presentation: Spoon Pharyngeal Phase Impairments: Multiple swallows   Thin Liquid Thin Liquid: Impaired Presentation: Cup;Self Fed;Straw Pharyngeal  Phase Impairments: Multiple swallows    Nectar Thick Nectar Thick Liquid: Not tested   Honey Thick Honey Thick Liquid: Not tested   Puree Puree: Impaired Presentation: Self Fed;Spoon Pharyngeal Phase Impairments: Multiple swallows   Solid   GO   Solid: Impaired Presentation: Self Fed Oral Phase Functional Implications: Impaired mastication Pharyngeal Phase Impairments: Multiple swallows        Germain Osgood 02/21/2018,9:26 AM  Germain Osgood, M.A. CCC-SLP [phone]

## 2018-02-21 NOTE — Progress Notes (Signed)
Patient arrived to unit via 2 person transport from 97M via wheel chair transport. O2 @ 4 L/min via N/C. Patient assisted to bed via 1 person stand and pivot. VS obtained. Patient stable. Breath sounds with audible expiratory wheezing. CHG bath given. Patient oriented to room and unit. Call light in reach. Personal fan at bedside per patient request for assistance with ease of breathing. IV site to right forearm intact and Saline locked. No drainage at IV site.

## 2018-02-21 NOTE — Progress Notes (Signed)
   02/21/18 1000  Clinical Encounter Type  Visited With Patient  Visit Type Initial  Referral From Chaplain  Consult/Referral To Chaplain  Spiritual Encounters  Spiritual Needs Emotional  Stress Factors  Patient Stress Factors Exhausted  Family Stress Factors None identified    Pt was laying on her bed seemingly very anxious. Chaplain introduced herself and Pt asked for someone to help her to use bathroom. Chaplain went and called for Pt's help and got helped . Chaplain will visit Pt when ready,. Chaplain provided compassionate presence.  Vernor Monnig a Medical sales representative, [employer]

## 2018-02-21 NOTE — Evaluation (Signed)
Physical Therapy Evaluation Patient Details Name: Megan [name] MRN: [id number] DOB: 1951-10-15 Today's Date: 02/21/2018   History of Present Illness  Patient is a 67 y.o female with COPD and a history of MRSA pneumonia requiring tracheostomy placement in April 2018 and HFpEF who presented with acute on chronic hypercarbic respiratory failure and septic shock. Pt was intubated 4/14-4/16 and then reintubated 4/17-4/22. PMH also includes MI, GERD, HTN, depression, anxiety, CAP.  Clinical Impression  Pt admitted with above diagnosis. Pt currently with functional limitations due to the deficits listed below (see PT Problem List). Pt was able to take a few steps to the 3N1 and then to recliner once finished on 3N1 with min steadying assist.  NT came in and cleaned pt.  Pt should progress well.  VSS on 4LO2.  Will follow acutely.  Pt will benefit from skilled PT to increase their independence and safety with mobility to allow discharge to the venue listed below.      Follow Up Recommendations Home health PT;Supervision/Assistance - 24 hour    Equipment Recommendations  (pt requests shower seat)    Recommendations for Other Services       Precautions / Restrictions Precautions Precautions: Fall Restrictions Weight Bearing Restrictions: No      Mobility  Bed Mobility Overal bed mobility: Independent                Transfers Overall transfer level: Needs assistance   Transfers: Sit to/from Stand;Stand Pivot Transfers Sit to Stand: Supervision;Min guard Stand pivot transfers: Supervision;Min guard       General transfer comment: Pt steadying herself by holding onto PT arms while tranferring to 3N1 and then to recliner.    Ambulation/Gait                Stairs            Wheelchair Mobility    Modified Rankin (Stroke Patients Only)       Balance Overall balance assessment: Mild deficits observed, not formally tested                                            Pertinent Vitals/Pain Pain Assessment: No/denies pain    Home Living Family/patient expects to be discharged to:: Private residence Living Arrangements: Children Available Help at Discharge: Family;Available 24 hours/day Type of Home: House Home Access: Stairs to enter   [employer] of Steps: 2 Home Layout: One level Home Equipment: Cane - single point;Walker - 4 wheels      Prior Function Level of Independence: Independent         Comments: Daughter is with pt most of time. young  Grandsons are present as well.     Hand Dominance   Dominant Hand: Right    Extremity/Trunk Assessment   Upper Extremity Assessment Upper Extremity Assessment: Defer to OT evaluation    Lower Extremity Assessment Lower Extremity Assessment: Overall WFL for tasks assessed    Cervical / Trunk Assessment Cervical / Trunk Assessment: Normal  Communication   Communication: No difficulties  Cognition Arousal/Alertness: Awake/alert Behavior During Therapy: WFL for tasks assessed/performed Overall Cognitive Status: Within Functional Limits for tasks assessed                                 General Comments: Pt reports she felt anxious  at end of treatment and asking for anxiety meds.  Notified nurse.       General Comments General comments (skin integrity, edema, etc.): Pt on 4LO2 with VSS.     Exercises General Exercises - Lower Extremity Ankle Circles/Pumps: AROM;Both;15 reps;Supine Long Arc Quad: AROM;Both;5 reps;Seated   Assessment/Plan    PT Assessment Patient needs continued PT services  PT Problem List Decreased activity tolerance;Decreased balance;Decreased mobility;Decreased knowledge of use of DME;Decreased safety awareness;Decreased knowledge of precautions       PT Treatment Interventions DME instruction;Functional mobility training;Therapeutic activities;Therapeutic exercise;Balance training;Patient/family education;Gait  training;Stair training    PT Goals (Current goals can be found in the Care Plan section)  Acute Rehab PT Goals Patient Stated Goal: to get better PT Goal Formulation: With patient Time For Goal Achievement: 03/07/18 Potential to Achieve Goals: Good    Frequency Min 3X/week   Barriers to discharge        Co-evaluation               AM-PAC PT "6 Clicks" Daily Activity  Outcome Measure Difficulty turning over in bed (including adjusting bedclothes, sheets and blankets)?: None Difficulty moving from lying on back to sitting on the side of the bed? : None Difficulty sitting down on and standing up from a chair with arms (e.g., wheelchair, bedside commode, etc,.)?: A Little Help needed moving to and from a bed to chair (including a wheelchair)?: A Little Help needed walking in hospital room?: A Lot Help needed climbing 3-5 steps with a railing? : A Lot 6 Click Score: 18    End of Session Equipment Utilized During Treatment: Gait belt;Oxygen Activity Tolerance: Patient limited by fatigue Patient left: in chair;with call bell/phone within reach;with chair alarm set Nurse Communication: Mobility status(pt asking for anxiety meds) PT Visit Diagnosis: Unsteadiness on feet (R26.81);Muscle weakness (generalized) (M62.81)    Time: 9390-3009 PT Time Calculation (min) (ACUTE ONLY): 23 min   Charges:   PT Evaluation $PT Eval Moderate Complexity: 1 Mod PT Treatments $Therapeutic Activity: 8-22 mins   PT G Codes:        Zavia Pullen,PT Acute Rehabilitation [phone] (pager)   Denice Paradise 02/21/2018, 11:39 AM

## 2018-02-22 LAB — CBC
HEMATOCRIT: 36.2 % (ref 36.0–46.0)
Hemoglobin: 11.2 g/dL — ABNORMAL LOW (ref 12.0–15.0)
MCH: 31.8 pg (ref 26.0–34.0)
MCHC: 30.9 g/dL (ref 30.0–36.0)
MCV: 102.8 fL — ABNORMAL HIGH (ref 78.0–100.0)
Platelets: 468 10*3/uL — ABNORMAL HIGH (ref 150–400)
RBC: 3.52 MIL/uL — ABNORMAL LOW (ref 3.87–5.11)
RDW: 13.3 % (ref 11.5–15.5)
WBC: 15.5 10*3/uL — AB (ref 4.0–10.5)

## 2018-02-22 LAB — BASIC METABOLIC PANEL
ANION GAP: 13 (ref 5–15)
BUN: 13 mg/dL (ref 6–20)
CO2: 33 mmol/L — ABNORMAL HIGH (ref 22–32)
Calcium: 9 mg/dL (ref 8.9–10.3)
Chloride: 96 mmol/L — ABNORMAL LOW (ref 101–111)
Creatinine, Ser: 0.57 mg/dL (ref 0.44–1.00)
GFR calc Af Amer: >60 mL/min (ref >60)
Glucose, Bld: 75 mg/dL (ref 65–99)
POTASSIUM: 4.1 mmol/L (ref 3.5–5.1)
SODIUM: 142 mmol/L (ref 135–145)

## 2018-02-22 LAB — GLUCOSE, CAPILLARY
GLUCOSE-CAPILLARY: 70 mg/dL (ref 65–99)
GLUCOSE-CAPILLARY: 147 mg/dL — AB (ref 65–99)
GLUCOSE-CAPILLARY: 156 mg/dL — AB (ref 65–99)
Glucose-Capillary: 282 mg/dL — ABNORMAL HIGH (ref 65–99)

## 2018-02-22 MED ORDER — TRAMADOL HCL 50 MG PO TABS
50.0000 mg | ORAL_TABLET | Freq: Once | ORAL | Status: AC
  Administered 2018-02-22: 50 mg via ORAL
  Filled 2018-02-22: qty 1

## 2018-02-22 MED ORDER — INSULIN ASPART 100 UNIT/ML ~~LOC~~ SOLN
0.0000 [IU] | Freq: Three times a day (TID) | SUBCUTANEOUS | Status: DC
  Administered 2018-02-24: 2 [IU] via SUBCUTANEOUS
  Administered 2018-02-24 – 2018-02-25 (×3): 3 [IU] via SUBCUTANEOUS
  Administered 2018-02-25 – 2018-02-26 (×2): 2 [IU] via SUBCUTANEOUS
  Administered 2018-02-26: 3 [IU] via SUBCUTANEOUS
  Administered 2018-02-26 – 2018-02-27 (×3): 2 [IU] via SUBCUTANEOUS

## 2018-02-22 MED ORDER — PREDNISONE 20 MG PO TABS
30.0000 mg | ORAL_TABLET | Freq: Every day | ORAL | Status: DC
  Administered 2018-02-23 – 2018-02-27 (×5): 30 mg via ORAL
  Filled 2018-02-22 (×5): qty 1

## 2018-02-22 MED ORDER — IPRATROPIUM-ALBUTEROL 0.5-2.5 (3) MG/3ML IN SOLN
3.0000 mL | Freq: Three times a day (TID) | RESPIRATORY_TRACT | Status: DC
  Administered 2018-02-22 – 2018-02-27 (×15): 3 mL via RESPIRATORY_TRACT
  Filled 2018-02-22 (×15): qty 3

## 2018-02-22 NOTE — Plan of Care (Signed)
  Problem: Clinical Measurements: Goal: Respiratory complications will improve Outcome: Progressing  Patient remains at 100 O2 saturation however, she refuses to wear the BiPaP she has been prescribed to wear overnight.

## 2018-02-22 NOTE — Progress Notes (Signed)
She was intubated for acute hypercarbic respiratory failure 4/14, reintubated 4/17.  She was extubated 4/22 to BiPAP and seems to have done well since Respiratory viral panel showed Meta pneumo virus. On exam-comfortable on nasal cannula, decreased breath sounds bilateral, no rhonchi, no edema  She was unable to use the BiPAP last night feels that she wants to try it some more. I will provide her with a BiPAP on discharge only if she is able to show compliance for the next 2 nights in the hospital.  There is some limited evidence that  noninvasive nocturnal ventilation is of some benefit and chronic hypercarbic respiratory failure  P CCM available as needed Rakesh V. Elsworth Soho MD [phone]

## 2018-02-22 NOTE — Evaluation (Signed)
Occupational Therapy Evaluation Patient Details Name: Megan [name] MRN: [id number] DOB: [birth year] Today's Date: 02/22/2018    History of Present Illness Patient is a 67 y.o female with COPD and a history of MRSA pneumonia requiring tracheostomy placement in April 2018 and HFpEF who presented with acute on chronic hypercarbic respiratory failure and septic shock. Pt was intubated 4/14-4/16 and then reintubated 4/17-4/22. PMH also includes MI, GERD, HTN, depression, anxiety, CAP.   Clinical Impression   PTA, pt reports independence with ADL and functional mobility and living with her daughter and grandchildren. She currently presents with decreased activity tolerance for ADL as well as generalized weakness. Pt currently requiring min guard assist for LB ADL and toileting hygiene. Poor activity tolerance, headache, and dyspnea impacted her ability to progress with functional mobility beyond standing at EOB for ADL participation this session. However, pt remains very motivated to return to [facility] and be with her grandchildren. Pt on 1.5L/min supplemental O2 via [place] initially during session with stable SpO2; however, she did desaturate to 86% on return to bed and increased supplemental from 1.5L to 3L (per RN) and encouraged pursed lip breathing techniques with rebound to >96% and returned pt to 2L supplemental O2. She would benefit from continued OT services while admitted to improve independence and safety with ADL and functional mobility prior to returning home with home health OT follow-up depending on progress.    Follow Up Recommendations  Home health OT;Supervision/Assistance - 24 hour    Equipment Recommendations  3 in 1 bedside commode    Recommendations for Other Services       Precautions / Restrictions Precautions Precautions: Fall Restrictions Weight Bearing Restrictions: No      Mobility Bed Mobility Overal bed mobility: Independent                Transfers Overall  transfer level: Needs assistance   Transfers: Sit to/from Stand Sit to Stand: Min guard         General transfer comment: Min guard assist for safety. Limited activity tolerance for standing during ADL tasks.     Balance Overall balance assessment: Needs assistance Sitting-balance support: No upper extremity supported;Feet supported Sitting balance-Leahy Scale: Good     Standing balance support: No upper extremity supported;Single extremity supported;During functional activity Standing balance-Leahy Scale: Fair Standing balance comment: Statically able to stand without UE support. Reaching for single UE support for stability at times.                            ADL either performed or assessed with clinical judgement   ADL Overall ADL's : Needs assistance/impaired Eating/Feeding: Set up;Sitting   Grooming: Min guard;Standing Grooming Details (indicate cue type and reason): Limited activity tolerance and only able to tolerate standing for brief periods of time.  Upper Body Bathing: Supervision/ safety;Sitting   Lower Body Bathing: Min guard;Sit to/from stand   Upper Body Dressing : Set up;Sitting   Lower Body Dressing: Min guard;Sit to/from Health and safety inspector Details (indicate cue type and reason): Completed sit<>stand with min guard assist this session for safety and stability. However, pt unable to tolerate standing for pivot or ambulation this session secondary to weakness and headache.  Toileting- Water quality scientist and Hygiene: Min guard;Sit to/from stand         General ADL Comments: Pt soaked with urine on my arrival despite [employer] [place]. Pt requiring encouragement to participate initially but able to stand  at EOB and complete toileting hygiene to clean herself as well as don mesh underwear with min guard assist.      Vision Patient Visual Report: No change from baseline Vision Assessment?: No apparent visual deficits     Perception      Praxis      Pertinent Vitals/Pain Pain Assessment: Faces Faces Pain Scale: Hurts a little bit Pain Location: head Pain Descriptors / Indicators: Grimacing Pain Intervention(s): Limited activity within patient's tolerance;Monitored during session;Repositioned     Hand Dominance Right   Extremity/Trunk Assessment Upper Extremity Assessment Upper Extremity Assessment: Generalized weakness   Lower Extremity Assessment Lower Extremity Assessment: Generalized weakness       Communication Communication Communication: No difficulties   Cognition Arousal/Alertness: Awake/alert Behavior During Therapy: WFL for tasks assessed/performed Overall Cognitive Status: Within Functional Limits for tasks assessed                                 General Comments: Pt reporting that today is a hard day as she lost a son and potentially a grandchild on this day a few years ago. Pt encouraged to participate and very motivated.    General Comments  Pt with SpO2 92-94% on 1.5 L O2 via [place] during toileting hygiene and LB dressing tasks. Increased supplemental O2 to 3L and encouraged pursed lip breathing techniques with pt able to improve saturations to >96%. Did note some redness in sacral region during assessment.     Exercises     Shoulder Instructions      Home Living Family/patient expects to be discharged to:: Private residence Living Arrangements: Children(and grandchildren) Available Help at Discharge: Family;Available 24 hours/day Type of Home: House Home Access: Stairs to enter [employer] of Steps: 2   Home Layout: One level     Bathroom Shower/Tub: Teacher, early years/pre: Standard     Home Equipment: Cane - single point;Walker - 4 wheels          Prior Functioning/Environment Level of Independence: Independent        Comments: Daughter is "in and out"         OT Problem List: Decreased strength;Decreased activity  tolerance;Impaired balance (sitting and/or standing);Decreased safety awareness;Decreased knowledge of use of DME or AE;Decreased knowledge of precautions;Pain      OT Treatment/Interventions: Self-care/ADL training;Therapeutic exercise;Energy conservation;DME and/or AE instruction;Therapeutic activities;Patient/family education;Balance training    OT Goals(Current goals can be found in the care plan section) Acute Rehab OT Goals Patient Stated Goal: to get better OT Goal Formulation: With patient Time For Goal Achievement: 03/08/18 Potential to Achieve Goals: Good ADL Goals Pt Will Perform Grooming: (P) with modified independence;standing Pt Will Perform Lower Body Dressing: (P) with modified independence;sit to/from stand Pt Will Transfer to Toilet: (P) with modified independence;ambulating;regular height toilet Pt Will Perform Toileting - Clothing Manipulation and hygiene: (P) with modified independence;sit to/from stand Pt Will Perform Tub/Shower Transfer: (P) with modified independence;3 in 1;Tub transfer  OT Frequency: Min 2X/week   Barriers to D/C:            Co-evaluation              AM-PAC PT "6 Clicks" Daily Activity     Outcome Measure Help from another person eating meals?: None Help from another person taking care of personal grooming?: A Little Help from another person toileting, which includes using toliet, bedpan, or urinal?: A Little Help from  another person bathing (including washing, rinsing, drying)?: A Little Help from another person to put on and taking off regular upper body clothing?: A Little Help from another person to put on and taking off regular lower body clothing?: A Little 6 Click Score: 19   End of Session Equipment Utilized During Treatment: Oxygen Nurse Communication: Mobility status  Activity Tolerance: Patient tolerated treatment well Patient left: in bed;with call bell/phone within reach;with bed alarm set  OT Visit Diagnosis: Other  abnormalities of gait and mobility (R26.89);Muscle weakness (generalized) (M62.81)                Time: 7014-1030 OT Time Calculation (min): 28 min Charges:  OT General Charges $OT Visit: 1 Visit OT Evaluation $OT Eval Moderate Complexity: 1 Mod OT Treatments $Self Care/Home Management : 8-22 mins G-Codes:     Norman Herrlich, MS OTR/L  Pager: [place] A Xareni Kelch 02/22/2018, 5:41 PM

## 2018-02-22 NOTE — Progress Notes (Signed)
Patient agreed to have respiratory place the BiPaP on her. She wore the mask for 35 minutes then asked for it to be removed. Placed on 4L O2 [place] again. Will continue to monitor. Lajoyce Corners, RN

## 2018-02-22 NOTE — Care Management Note (Addendum)
Case Management Note Marvetta Gibbons RN, BSN Unit 4E-Case Manager [phone]  Patient Details  Name: Megan [name] MRN: [id number] Date of Birth: 03-29-1951  Subjective/Objective:  Pt admitted with acute resp. Failure- septic shock requiring pressors- and intubated on 4/14- extubated on 4/17 to Bipap (hx of COPD and MRSA pna)                Action/Plan: PTA pt lived at home with children (daughter is primary caregiver ,Daughter is Randel Pigg: [phone] ( home) [phone] (cell)- per PT eval recommendations for [facility]- will need orders prior to discharge- pt would like shower chair- needs DME order- has walker and cane at home --pt also has home 02 baseline 2-3L-  CM to follow for transition of care needs.   Expected Discharge Date:                  Expected Discharge Plan:  [place]  In-House Referral:     Discharge planning Services  CM Consult  Post Acute Care Choice:  Home Health Choice offered to:     DME Arranged:    DME Agency:     HH Arranged:    [place] Agency:     Status of Service:  In process, will continue to follow  If discussed at Long Length of Stay Meetings, dates discussed:  4/23, 4/25  Discharge Disposition:   Additional Comments:  Dawayne Patricia, RN 02/22/2018, 12:11 PM

## 2018-02-22 NOTE — Progress Notes (Signed)
Patient Demographics:    Megan [name], is a 67 y.o. female, DOB - Sep 27, 1951, RDE:[id number]  Admit date - 02/12/2018   Admitting Physician Kandice Hams, MD  Outpatient Primary MD for the patient is [name], MD  LOS - 9   Chief Complaint  Patient presents with  . Respiratory Distress        Subjective:    Megan [name] today has no fevers, no emesis,  No chest pain, patient not compliant with CPAP/BiPAP use,  Assessment  & Plan :    Active Problems:   Sepsis ([place])   Acute respiratory failure with hypoxia and hypercapnia (HCC)   Malnutrition of moderate degree   Palliative care by specialist  Brief summary Pt admitted to 67 year old with past medical history relevant for HFPEF, chronic hypoxic and respiratory respiratory failure in the setting of COPD on 2 L/min PTA, history of prior MRSA pneumonia requiring tracheostomy placement in April 2018 who was admitted  on 02/12/18 with acute on chronic hypoxic and hypercapnic respiratory Failure in the setting of acute pneumonia and septic shock requiring pressors-prior to admission patient had failed prednisone and doxycycline as outpatient for COPD exacerbation . She was intubated on 02/12/18 (on admission), extubated 02/14/2018, reintubated 02/15/2018 then extubated 02/20/2018, now requiring BiPAP nightly.                  Plan:- 1)Acute on chronic hypoxic and hypercapnic respiratory failure--overall improving,  s/p extubation on 02/20/2018, patient has severe emphysema,  Respiratory Viral Panel positive for metapneumovirus  continue low-flow oxygen to keep O2 sats in the low 90s, use CPAP/BiPAP nightly, patient is at times noncompliant with BiPAP/CPAP use, prior to admission she was on 2 L of oxygen via cannula at home, currently on prednisone 30 mg daily  2)H/o CAD/Prior NSTEMI- PCCM had discontinued beta-blocker presumably due to concerns about  bronchial spasms, may use metoprolol as needed however, borderline elevation of troponin this admission probably related to demand ischemia secondary to #1 above, echocardiogram to rule out wall motion abnormalities pending  3)HFpEF-clinically appears stable/compensated, last known EF 55 to 60%, borderline elevation of troponin this admission probably related to demand ischemia secondary to #1 above, echocardiogram to rule out wall motion abnormalities pending  4)H/o HTN-currently on Cardizem 30 mg 3 times daily,   may use IV Hydralazine 10 mg  Every 4 hours Prn for systolic blood pressure over 160 mmhg  Code Status : Full Disposition Plan  : await PT/OT Eval  Consults  :  PCCM  CULTURES: 4/14: Blood cultures with no growth to date 4/14: Tracheal aspirate no growth to date  4/14: Respiratory Viral Panel positive for metapneumovirus   ANTIBIOTICS: Vancomycin 4/14 - 4/15  Levofloxacin 4/14 - 4/16  Azithromycin 4/16 - 4/18  SIGNIFICANT EVENTS:  March treated for COPD exacerbation with Prednisone and Doxycycline >>> Again treated for COPD exacerbation with Prednisone and Doxyccyline 02/07/18 >>> Acute on Chronic Hypercarbic Respiratory Failure requiring intubation 02/12/18 >>> Extubated on 02/14/18 >>> Re-intubated for acute on chronic hypercarbic respiratory failure 02/15/18 >>> Extubate to BiPAP 4/22 H/o of tracheostomy (Placed in April 2018, subsequently removed)  DVT Prophylaxis  :  - Heparin  Lab Results  Component Value Date   PLT 468 (H)  02/22/2018    Inpatient Medications  Scheduled Meds: . aspirin  81 mg Oral Daily  . budesonide (PULMICORT) nebulizer solution  0.5 mg Nebulization BID  . diltiazem  30 mg Oral Q8H  . heparin  5,000 Units Subcutaneous Q8H  . insulin aspart  0-9 Units Subcutaneous Q4H  . ipratropium-albuterol  3 mL Nebulization TID  . pantoprazole  40 mg Oral Daily  . polyethylene glycol  17 g Oral Daily  . predniSONE  30 mg Oral Q breakfast  .  senna-docusate  1 tablet Oral BID  . sertraline  25 mg Oral Daily   Continuous Infusions: . sodium chloride 10 mL/hr at 02/21/18 0700   PRN Meds:.sodium chloride, acetaminophen, ALPRAZolam, hydrALAZINE, metoprolol tartrate, nitroGLYCERIN, phenol    Anti-infectives (From admission, onward)   Start     Dose/Rate Route Frequency Ordered Stop   02/14/18 1015  azithromycin (ZITHROMAX) tablet 250 mg     250 mg Per Tube Daily 02/14/18 1000 02/16/18 0900   02/13/18 2200  vancomycin (VANCOCIN) IVPB 1000 mg/200 mL premix  Status:  Discontinued     1,000 mg 200 mL/hr over 60 Minutes Intravenous Every 24 hours 02/13/18 0102 02/13/18 1036   02/13/18 2200  levofloxacin (LEVAQUIN) IVPB 500 mg  Status:  Discontinued     500 mg 100 mL/hr over 60 Minutes Intravenous Every 24 hours 02/13/18 0102 02/14/18 1000   02/12/18 2145  levofloxacin (LEVAQUIN) IVPB 750 mg     750 mg 100 mL/hr over 90 Minutes Intravenous  Once 02/12/18 2132 02/13/18 0137   02/12/18 2145  vancomycin (VANCOCIN) IVPB 1000 mg/200 mL premix     1,000 mg 200 mL/hr over 60 Minutes Intravenous  Once 02/12/18 2132 02/13/18 0137        Objective:   Vitals:   02/22/18 0955 02/22/18 1122 02/22/18 1224 02/22/18 1309  BP:      Pulse: (!) 114 (!) 111  (!) 120  Resp: (!) 24 (!) 23  (!) 21  Temp:      TempSrc:      SpO2: 98% 91% 95% 98%  Weight:      Height:        Wt Readings from Last 3 Encounters:  02/22/18 41.2 kg (90 lb 14.4 oz)  02/07/18 41.5 kg (91 lb 9.6 oz)  01/19/18 42.2 kg (93 lb)     Intake/Output Summary (Last 24 hours) at 02/22/2018 1446 Last data filed at 02/22/2018 0819 Gross per 24 hour  Intake 360 ml  Output 1450 ml  Net -1090 ml     Physical Exam  Gen:- Awake Alert, chronically and cachectic appearing HEENT:- [place].AT, No sclera icterus Nose- [place] 2L/min Neck-Supple Neck,No JVD,.  Lungs-diminished in bases , no significant wheezing at this time,  CV- S1, S2 normal Abd-  +ve B.Sounds, Abd Soft, No  tenderness,    Extremity/Skin:- No  edema,   good pulses Psych-affect is appropriate, oriented x3 Neuro-no new focal deficits, no tremors   Data Review:   Micro Results Recent Results (from the past 240 hour(s))  Blood culture (routine x 2)     Status: None   Collection Time: 02/12/18  8:52 PM  Result Value Ref Range Status   Specimen Description BLOOD RIGHT UPPER ARM  Final   Special Requests   Final    BOTTLES DRAWN AEROBIC ONLY Blood Culture results may not be optimal due to an inadequate volume of blood received in culture bottles   Culture   Final    NO  GROWTH 5 DAYS Performed at [facility], [place] [address], [place], [place] [address]    Report Status 02/17/2018 FINAL  Final  Blood culture (routine x 2)     Status: None   Collection Time: 02/12/18  9:30 PM  Result Value Ref Range Status   Specimen Description BLOOD RIGHT HAND  Final   Special Requests   Final    BOTTLES DRAWN AEROBIC AND ANAEROBIC Blood Culture results may not be optimal due to an inadequate volume of blood received in culture bottles   Culture   Final    NO GROWTH 5 DAYS Performed at [facility], [place] [address], [place], [place] [address]    Report Status 02/17/2018 FINAL  Final  Respiratory Panel by PCR     Status: Abnormal   Collection Time: 02/12/18 11:50 PM  Result Value Ref Range Status   Adenovirus NOT DETECTED NOT DETECTED Final   Coronavirus 229E NOT DETECTED NOT DETECTED Final   Coronavirus HKU1 NOT DETECTED NOT DETECTED Final   Coronavirus NL63 NOT DETECTED NOT DETECTED Final   Coronavirus OC43 NOT DETECTED NOT DETECTED Final   Metapneumovirus DETECTED (A) NOT DETECTED Final   Rhinovirus / Enterovirus NOT DETECTED NOT DETECTED Final   Influenza A NOT DETECTED NOT DETECTED Final   Influenza B NOT DETECTED NOT DETECTED Final   Parainfluenza Virus 1 NOT DETECTED NOT DETECTED Final   Parainfluenza Virus 2 NOT DETECTED NOT DETECTED Final   Parainfluenza Virus 3 NOT DETECTED NOT  DETECTED Final   Parainfluenza Virus 4 NOT DETECTED NOT DETECTED Final   Respiratory Syncytial Virus NOT DETECTED NOT DETECTED Final   Bordetella pertussis NOT DETECTED NOT DETECTED Final   Chlamydophila pneumoniae NOT DETECTED NOT DETECTED Final   Mycoplasma pneumoniae NOT DETECTED NOT DETECTED Final    Comment: Performed at [facility], [place] [address] [place], [place] [address]  Culture, respiratory (NON-Expectorated)     Status: None   Collection Time: 02/13/18  2:44 AM  Result Value Ref Range Status   Specimen Description TRACHEAL ASPIRATE  Final   Special Requests NONE  Final   Gram Stain   Final    RARE WBC PRESENT, PREDOMINANTLY PMN NO ORGANISMS SEEN    Culture   Final    RARE Consistent with normal respiratory flora. Performed at [facility], [place] [address] [place], [place] [address]    Report Status 02/15/2018 FINAL  Final  MRSA PCR Screening     Status: None   Collection Time: 02/13/18  3:04 AM  Result Value Ref Range Status   MRSA by PCR NEGATIVE NEGATIVE Final    Comment:        The GeneXpert MRSA Assay (FDA approved for NASAL specimens only), is one component of a comprehensive MRSA colonization surveillance program. It is not intended to diagnose MRSA infection nor to guide or monitor treatment for MRSA infections. Performed at [facility], [place] [address], [place], [place] [address]     Radiology Reports Dg Chest 2 View  Result Date: 02/07/2018 CLINICAL DATA:  Cough, chest tightness, congestion, and shortness of breath for the past 2 weeks. History of COPD, former smoker. Previous episodes of pneumonia. EXAM: CHEST - 2 VIEW COMPARISON:  PA and lateral chest x-ray of July 07, 2017 FINDINGS: The lungs are hyperinflated with hemidiaphragm flattening. There is no focal infiltrate. The heart and pulmonary vascularity are normal. The mediastinum is normal in width. There is no pleural  effusion. There is calcification in the wall of the  aortic arch. Nipple shadows are visible bilaterally. The bony thorax is unremarkable. IMPRESSION: Marked hyperinflation consistent with COPD. There is no acute cardiopulmonary abnormality. Thoracic aortic atherosclerosis. Electronically Signed   By: David  Martin[place] M.D.   On: 02/07/2018 13:07   Dg Abd 1 View  Result Date: 02/15/2018 CLINICAL DATA:  NG placement EXAM: ABDOMEN - 1 VIEW COMPARISON:  02/13/2018 FINDINGS: NG has been advanced. The tube is in the region of the gastric antrum and could be coiled in the antrum however the tip could be in the second portion the duodenum. Normal bowel gas pattern.  No ileus or obstruction IMPRESSION: NG tube is in the gastric antrum versus proximal duodenum. Normal bowel gas pattern Electronically Signed   By: Franchot Gallo M.D.   On: 02/15/2018 18:17   Dg Chest Port 1 View  Result Date: 02/20/2018 CLINICAL DATA:  Intubation EXAM: PORTABLE CHEST 1 VIEW COMPARISON:  Portable exam 1226 hours compared to 02/18/2018 FINDINGS: Endotracheal tube, nasogastric tube and RIGHT jugular line stable. Normal heart size, mediastinal contours, and pulmonary vascularity. Emphysematous and bronchitic changes consistent with COPD. No acute infiltrate, pleural effusion or pneumothorax. Bones demineralized. IMPRESSION: COPD changes without acute infiltrate. Electronically Signed   By: Lavonia Dana M.D.   On: 02/20/2018 12:44   Dg Chest Port 1 View  Result Date: 02/18/2018 CLINICAL DATA:  Endotracheally intubated. EXAM: PORTABLE CHEST 1 VIEW COMPARISON:  02/17/2018 FINDINGS: The endotracheal tube is appropriately positioned between the clavicles and carina. Enteric tube courses into the left upper abdomen with tip not imaged. Right jugular catheter terminates over the lower SVC. The cardiac silhouette is normal in size. The lungs remain hyperinflated without evidence of airspace consolidation, edema, sizable pleural effusion, or pneumothorax. IMPRESSION: Support devices as above.  Hyperinflation evidence of active cardiopulmonary disease. Electronically Signed   By: Logan Bores M.D.   On: 02/18/2018 08:50   Dg Chest Port 1 View  Result Date: 02/17/2018 CLINICAL DATA:  Assess et tube resp failure EXAM: PORTABLE CHEST 1 VIEW COMPARISON:  02/16/2018 FINDINGS: Endotracheal tube is 5.1 centimeters above the carina. RIGHT IJ central line tip overlies the superior vena cava. Nasogastric tube is in place, tip beyond the gastroesophageal junction and off the image. Heart size is normal. Lungs are hyperinflated. No focal consolidations. No pulmonary edema. IMPRESSION: Stable lines and tubes. 1. Hyperinflation. 2.  No evidence for acute pulmonary abnormality. Electronically Signed   By: Nolon Nations M.D.   On: 02/17/2018 09:22   Dg Chest Port 1 View  Result Date: 02/16/2018 CLINICAL DATA:  Acute respiratory failure, hypoxia EXAM: PORTABLE CHEST 1 VIEW COMPARISON:  02/15/2018 FINDINGS: Endotracheal tube and right central line are unchanged. Interval placement of NG tube into the stomach. There is hyperinflation of the lungs compatible with COPD. No confluent airspace opacities or effusions. Heart is normal size. IMPRESSION: COPD.  No active disease. Interval placement of NG tube.  Other support devices stable. Electronically Signed   By: Rolm Baptise M.D.   On: 02/16/2018 09:01   Dg Chest Port 1 View  Result Date: 02/15/2018 CLINICAL DATA:  Encounter for intubation EXAM: PORTABLE CHEST 1 VIEW COMPARISON:  Yesterday FINDINGS: Right IJ central line with tip at the SVC. Endotracheal tube tip between the clavicular heads and carina. COPD with hyperinflation and interstitial coarsening. Symmetric nipple shadows. Normal heart size and mediastinal contours. Partial coverage of the bases. No visible effusion or pneumothorax. IMPRESSION: 1. Endotracheal tube in  good position. 2. COPD.  No acute superimposed finding. 3. Incomplete visualization of the bases. Electronically Signed   By: Monte Fantasia M.D.   On: 02/15/2018 15:09   Dg Chest Port 1 View  Result Date: 02/14/2018 CLINICAL DATA:  Status post intubation. EXAM: PORTABLE CHEST 1 VIEW COMPARISON:  Radiograph of February 13, 2018. FINDINGS: The heart size and mediastinal contours are within normal limits. Endotracheal and nasogastric tubes are unchanged in position. Right internal jugular catheter is unchanged in position. No pneumothorax or pleural effusion is noted. Hyperexpansion of the lungs is noted. Atherosclerosis of thoracic aorta is noted. No acute pulmonary disease is noted. The visualized skeletal structures are unremarkable. IMPRESSION: Stable support apparatus. Hyperexpansion of the lungs. No acute cardiopulmonary abnormality seen. Aortic Atherosclerosis (ICD10-I70.0). Electronically Signed   By: Marijo Conception, M.D.   On: 02/14/2018 07:11   Dg Chest Port 1 View  Result Date: 02/13/2018 CLINICAL DATA:  Shortness of breath.  Endotracheal placement. EXAM: PORTABLE CHEST 1 VIEW COMPARISON:  02/12/2018 FINDINGS: Endotracheal tube tip is 2 cm above the carina. Nasogastric tube enters the abdomen. Right internal jugular central line tip is 5 cm above the right atrium. The lungs remain hyperinflated but clear. No sign of infiltrate or collapse. Normal heart size. Aortic atherosclerosis as seen previously. IMPRESSION: Lines and tubes well positioned.  Emphysema without focal finding. Electronically Signed   By: Nelson Chimes M.D.   On: 02/13/2018 07:05   Dg Chest Portable 1 View  Result Date: 02/12/2018 CLINICAL DATA:  Endotracheal tube, central line and orogastric tube placement. EXAM: PORTABLE CHEST 1 VIEW COMPARISON:  Chest radiograph performed earlier today at 8:25 p.m. FINDINGS: The patient's endotracheal tube is seen ending 2 cm above the carina. An enteric tube is noted extending below the diaphragm. A right IJ line is noted ending about the mid SVC. The lungs are hyperexpanded, with flattening of the diaphragms, reflecting  emphysema. The lung bases are incompletely characterized on this study. No significant pleural effusion or pneumothorax is seen. The cardiomediastinal silhouette is normal in size. No acute osseous abnormalities are identified. IMPRESSION: 1. Endotracheal tube seen ending 2 cm above the carina. 2. Enteric tube noted extending below the diaphragm. 3. Right IJ line noted ending about the mid SVC. 4. Emphysema.  Lungs otherwise grossly clear. Electronically Signed   By: Garald Balding M.D.   On: 02/12/2018 23:47   Dg Chest Port 1 View  Result Date: 02/12/2018 CLINICAL DATA:  Respiratory distress EXAM: PORTABLE CHEST 1 VIEW COMPARISON:  02/07/2018 FINDINGS: Left lower lobe opacity, new/increased from the prior, suspicious for pneumonia. Right lung is essentially clear, noting hyperinflation with emphysematous changes. Possible small left pleural effusion.  No pneumothorax. The heart is normal in size. IMPRESSION: Left lower lobe opacity, suspicious for pneumonia. Electronically Signed   By: Julian Hy M.D.   On: 02/12/2018 21:08   Dg Abd Portable 1v  Result Date: 02/13/2018 CLINICAL DATA:  Encounter for orogastric (OG) tube placement EXAM: PORTABLE ABDOMEN - 1 VIEW COMPARISON:  None. FINDINGS: NG tube extends the stomach. Side port is at the GE junction. Film is overexposed the preventing evaluation of the lung parenchyma. IMPRESSION: NG tube in stomach with side port at the GE junction. Electronically Signed   By: Suzy Bouchard M.D.   On: 02/13/2018 21:17     CBC Recent Labs  Lab 02/18/18 0410 02/19/18 0510 02/20/18 0603 02/21/18 0500 02/22/18 0637  WBC 5.0 6.5 7.8 7.2 15.5*  HGB 10.0* 8.7*  8.9* 8.8* 11.2*  HCT 33.1* 28.5* 28.7* 29.3* 36.2  PLT 240 203 247 295 468*  MCV 99.1 100.4* 99.3 100.7* 102.8*  MCH 29.9 30.6 30.8 30.2 31.8  MCHC 30.2 30.5 31.0 30.0 30.9  RDW 13.8 14.0 14.2 13.7 13.3    Chemistries  Recent Labs  Lab 02/18/18 0410 02/19/18 0510 02/20/18 0603  02/21/18 0500 02/22/18 0236  NA 140 141 138 142 142  K 4.2 3.7 3.7 3.9 4.1  CL 98* 104 99* 101 96*  CO2 35* 29 34* 35* 33*  GLUCOSE 250* 179* 176* 90 75  BUN 32* 39* 35* 22* 13  CREATININE 0.70 0.74 0.65 0.54 0.57  CALCIUM 8.7* 7.5* 8.5* 8.4* 9.0   ------------------------------------------------------------------------------------------------------------------ No results for input(s): CHOL, HDL, LDLCALC, TRIG, CHOLHDL, LDLDIRECT in the last 72 hours.  Lab Results  Component Value Date   HGBA1C 5.5 01/19/2018   ------------------------------------------------------------------------------------------------------------------ No results for input(s): TSH, T4TOTAL, T3FREE, THYROIDAB in the last 72 hours.  Invalid input(s): FREET3 ------------------------------------------------------------------------------------------------------------------ No results for input(s): VITAMINB12, FOLATE, FERRITIN, TIBC, IRON, RETICCTPCT in the last 72 hours.  Coagulation profile No results for input(s): INR, PROTIME in the last 168 hours.  No results for input(s): DDIMER in the last 72 hours.  Cardiac Enzymes Recent Labs  Lab 02/19/18 0510 02/19/18 1026 02/19/18 1631  TROPONINI 0.11* 0.11* 0.10*   ------------------------------------------------------------------------------------------------------------------    Component Value Date/Time   BNP 184.2 (H) 02/12/2018 2052     Roxan Hockey M.D on 02/22/2018 at 2:46 PM  Between 7am to 7pm - Pager - [phone]  After 7pm go to [email or web address] - password [email or web address]  [facility] -  Office  [phone]   Voice Recognition Viviann Spare dictation system was used to create this note, attempts have been made to correct errors. Please contact the author with questions and/or clarifications.

## 2018-02-22 NOTE — Progress Notes (Signed)
  Speech Language Pathology Treatment: Dysphagia  Patient Details Name: Megan [name] MRN: [id number] DOB: [birth year] Today's Date: 02/22/2018 Time: 2395-3202 SLP Time Calculation (min) (ACUTE ONLY): 13 min  Assessment / Plan / Recommendation Clinical Impression   Pt presents with no signs/symptoms concerning for aspiration with PO trials of regular solids and thin liquids. Pt required Min verbal cues to take breaks between bites/sips. Given pt's notable fatigue during PO trials, recommend pt continue on Dysphagia 3 (mechanical soft) for energy conservation. Continue with thin liquids and intermittent supervision, with pills administered whole with thin liquid. SLP will follow for continued assessment of diet appropriateness/readiness to advance diet textures.    HPI HPI: Patient is a 67 y.o female with COPD and a history of MRSA pneumonia requiring tracheostomy placement in April 2018 and HFpEF who presented with acute on chronic hypercarbic respiratory failure and septic shock. Pt was intubated 4/14-4/16 and then reintubated 4/17-4/22. PMH also includes MI, GERD, HTN, depression, anxiety, CAP. Pt was seen by SLP for swallowing in April 2017 with recommendations for Dys 3 diet and thin liquids. In May 2018 she was seen for PMV trials with limited tolerance, therefore swallowing was not assessed. MBS was completed while pt was at [facility] with report not available.      SLP Plan  Continue with current plan of care       Recommendations  Diet recommendations: Dysphagia 3 (mechanical soft);Thin liquid Liquids provided via: Cup;Straw Medication Administration: Whole meds with liquid Supervision: Patient able to self feed;Intermittent supervision to cue for compensatory strategies Compensations: Slow rate;Small sips/bites Postural Changes and/or Swallow Maneuvers: Seated upright 90 degrees                Oral Care Recommendations: Oral care BID Follow up Recommendations: None SLP Visit  Diagnosis: Dysphagia, unspecified (R13.10) Plan: Continue with current plan of care       GO               [place] Nicki Gracy SLP Student Clinician  [place] Elinore Shults 02/22/2018, 4:58 PM

## 2018-02-22 NOTE — Progress Notes (Signed)
Attempted x3 to place patient on bipap for the night. Pt adamantly refusing, states she would like to speak to MD in AM about the need to wear the mask. This RN and RT have explained necessity of bipap to pt. Currently, pt is AOx4, diminished lung sounds, 100% on 4L [place]. Denies current SOB, unlabored breathing. Explained to pt to notify RN if SOB worsens. Will continue to closely monitor.  Jaymes Graff, RN

## 2018-02-23 ENCOUNTER — Inpatient Hospital Stay (HOSPITAL_COMMUNITY): Payer: Medicare Other

## 2018-02-23 DIAGNOSIS — E872 Acidosis: Secondary | ICD-10-CM

## 2018-02-23 DIAGNOSIS — G9340 Encephalopathy, unspecified: Secondary | ICD-10-CM

## 2018-02-23 LAB — GLUCOSE, CAPILLARY
GLUCOSE-CAPILLARY: 115 mg/dL — AB (ref 65–99)
Glucose-Capillary: 119 mg/dL — ABNORMAL HIGH (ref 65–99)
Glucose-Capillary: 131 mg/dL — ABNORMAL HIGH (ref 65–99)
Glucose-Capillary: 120 mg/dL — ABNORMAL HIGH (ref 65–99)

## 2018-02-23 LAB — ECHOCARDIOGRAM COMPLETE
Height: 63 in
Weight: 1454.4 oz

## 2018-02-23 LAB — BLOOD GAS, ARTERIAL
ACID-BASE EXCESS: 20.7 mmol/L — AB (ref 0.0–2.0)
BICARBONATE: 48.7 mmol/L — AB (ref 20.0–28.0)
DELIVERY SYSTEMS: POSITIVE
DRAWN BY: 277551
Expiratory PAP: 6
FIO2: 60
Inspiratory PAP: 14
O2 Saturation: 98.3 %
PATIENT TEMPERATURE: 98.6
PCO2 ART: 115 mmHg — AB (ref 32.0–48.0)
PH ART: 7.251 — AB (ref 7.350–7.450)
PO2 ART: 124 mmHg — AB (ref 83.0–108.0)

## 2018-02-23 MED ORDER — ONDANSETRON HCL 4 MG/2ML IJ SOLN
4.0000 mg | Freq: Four times a day (QID) | INTRAMUSCULAR | Status: DC | PRN
  Administered 2018-02-23: 4 mg via INTRAVENOUS
  Filled 2018-02-23: qty 2

## 2018-02-23 MED ORDER — TRAMADOL HCL 50 MG PO TABS
50.0000 mg | ORAL_TABLET | Freq: Once | ORAL | Status: AC
  Administered 2018-02-23: 50 mg via ORAL
  Filled 2018-02-23: qty 1

## 2018-02-23 NOTE — Progress Notes (Signed)
Increased FIO2 60% due to decreased sats during bedside procedure

## 2018-02-23 NOTE — Progress Notes (Signed)
Increased IPAP 20 and decreased FIO2 40 due to abg results. RN notified MD

## 2018-02-23 NOTE — Progress Notes (Signed)
Physical Therapy Treatment Patient Details Name: Megan [name] MRN: [id number] DOB: [birth year] Today's Date: 02/23/2018    History of Present Illness Patient is a 67 y.o female with COPD and a history of MRSA pneumonia requiring tracheostomy placement in April 2018 and HFpEF who presented with acute on chronic hypercarbic respiratory failure and septic shock. Pt was intubated 4/14-4/16 and then reintubated 4/17-4/22. PMH also includes MI, GERD, HTN, depression, anxiety, CAP.    PT Comments    Mobility limited secondary to patient placement on BiPAP. Patient remains very motivated and based on limited examination of strength and transfers, suspect she will progress well. Needing supervision for transferring from bed to [facility]. Rest of session focused on bilateral lower extremity therapeutic exercises with increased repetitions to promote endurance. Will follow to progress functional mobility.    Follow Up Recommendations  Home health PT;Supervision/Assistance - 24 hour     Equipment Recommendations  (pt requests shower seat)    Recommendations for Other Services       Precautions / Restrictions Precautions Precautions: Fall Restrictions Weight Bearing Restrictions: No    Mobility  Bed Mobility Overal bed mobility: Independent                Transfers Overall transfer level: Needs assistance   Transfers: Sit to/from Stand;Stand Pivot Transfers Sit to Stand: Min guard Stand pivot transfers: Supervision       General transfer comment: Supervision for safety with sit to stand from bed and stand pivot to and from [facility]  Ambulation/Gait                 Stairs             Wheelchair Mobility    Modified Rankin (Stroke Patients Only)       Balance Overall balance assessment: Needs assistance Sitting-balance support: No upper extremity supported;Feet supported Sitting balance-Leahy Scale: Good     Standing balance support: No upper extremity  supported;Single extremity supported;During functional activity Standing balance-Leahy Scale: Fair Standing balance comment: Statically able to stand without UE support. Reaching for single UE support for stability at times.                             Cognition Arousal/Alertness: Awake/alert Behavior During Therapy: WFL for tasks assessed/performed Overall Cognitive Status: Within Functional Limits for tasks assessed                                        Exercises General Exercises - Lower Extremity Heel Slides: AROM;Both;20 reps;Supine Straight Leg Raises: 20 reps;Supine;AROM;Both    General Comments General comments (skin integrity, edema, etc.): SpO2 maintaining 96% on BiPAP FIO2 40, HR 119      Pertinent Vitals/Pain Pain Assessment: No/denies pain    Home Living                      Prior Function            PT Goals (current goals can now be found in the care plan section) Acute Rehab PT Goals Patient Stated Goal: to get better PT Goal Formulation: With patient Time For Goal Achievement: 03/07/18 Potential to Achieve Goals: Good Progress towards PT goals: Progressing toward goals    Frequency    Min 3X/week      PT Plan Current plan remains appropriate  Co-evaluation              AM-PAC PT "6 Clicks" Daily Activity  Outcome Measure  Difficulty turning over in bed (including adjusting bedclothes, sheets and blankets)?: None Difficulty moving from lying on back to sitting on the side of the bed? : None Difficulty sitting down on and standing up from a chair with arms (e.g., wheelchair, bedside commode, etc,.)?: A Little Help needed moving to and from a bed to chair (including a wheelchair)?: A Little Help needed walking in hospital room?: A Little Help needed climbing 3-5 steps with a railing? : A Lot 6 Click Score: 19    End of Session Equipment Utilized During Treatment: Gait belt;Oxygen Activity  Tolerance: Other (comment)(Limited by BiPAP) Patient left: in bed;with call bell/phone within reach Nurse Communication: Mobility status PT Visit Diagnosis: Unsteadiness on feet (R26.81);Muscle weakness (generalized) (M62.81)     Time: 4562-5638 PT Time Calculation (min) (ACUTE ONLY): 17 min  Charges:  $Therapeutic Activity: 8-22 mins                    G Codes:      Ellamae Sia, PT, DPT Acute Rehabilitation Services  Pager: [phone]    Willy Eddy 02/23/2018, 3:04 PM

## 2018-02-23 NOTE — Progress Notes (Signed)
Name: Megan [name] MRN: [id number] DOB: [birth year]    ADMISSION DATE:  02/12/2018  REFERRING MD :  EDP   CHIEF COMPLAINT:  Respiratory failure   BRIEF PATIENT DESCRIPTION:  67 year old woman with severe COPD on home oxygen admitted 4/14 for acute hypercarbic respiratory failure after she failed outpatient treatment for COPD exacerbation.  Intubated in ER.  She failed extubation on the 16th due to acute respiratory acidosis, respiratory viral panel positive for Meta pneumo virus. She has a history of MRSA pneumonia requiring tracheostomy in 01/2017  LINES / TUBES: Right IJ CVC 4/14  Endotracheal tube 4/14> 4/16, re-intubated 4/17  CULTURES: 4/14: Blood cultures with no growth to date 4/14: Tracheal aspirate no growth to date  4/14: Respiratory Viral Panel positive for metapneumovirus   ANTIBIOTICS: Vancomycin 4/14 - 4/15  Levofloxacin 4/14 - 4/16  Azithromycin 4/16 - 4/18  SIGNIFICANT EVENTS:  March treated for COPD exacerbation with Prednisone and Doxycycline >>> Again treated for COPD exacerbation with Prednisone and Doxyccyline 02/07/18 >>> Acute on Chronic Hypercarbic Respiratory Failure requiring intubation 02/12/18 >>> Extubated on 02/14/18 >>> Re-intubated for acute on chronic hypercarbic respiratory failure 02/15/18 >>> Extubated 4/22   SUBJECTIVE:  Re-consulted for lethargy, hypercarbia.  Now on bipap.  ABG with respiratory acidosis, PCO2 115.   Per RN did not wear bipap overnight.  Had xanax at 3am, nothing sedating since.  On my arrival she is waking up.  Easily arousable now.  Comfortable on bipap.  C/o headache   VITAL SIGNS: Temp:  [97.8 F (36.6 C)-99.2 F (37.3 C)] 98.8 F (37.1 C) (04/25 1120) Pulse Rate:  [76-108] 89 (04/25 1229) Resp:  [14-26] 16 (04/25 1229) BP: (123-184)/(54-72) 123/54 (04/25 1120) SpO2:  [89 %-100 %] 99 % (04/25 1323) Weight:  [41.2 kg (90 lb 14.4 oz)] 41.2 kg (90 lb 14.4 oz) (04/25 0300)  PHYSICAL EXAMINATION: General:  Thin,  frail, chronically ill appearing female, NAD on bipap  Neuro:  Drowsy but arouses easily, answers questions appropriately, MAE, follows commands  HEENT:  Mm moist, bipap  Cardiovascular:  s1s2 rrr Lungs:  resps even non labored on bipap, rare exp wheeze  Abdomen:  Soft, non tender  Musculoskeletal:  Warm and dry, no sig edema   Recent Labs  Lab 02/20/18 0603 02/21/18 0500 02/22/18 0236  NA 138 142 142  K 3.7 3.9 4.1  CL 99* 101 96*  CO2 34* 35* 33*  BUN 35* 22* 13  CREATININE 0.65 0.54 0.57  GLUCOSE 176* 90 75   Recent Labs  Lab 02/20/18 0603 02/21/18 0500 02/22/18 0637  HGB 8.9* 8.8* 11.2*  HCT 28.7* 29.3* 36.2  WBC 7.8 7.2 15.5*  PLT 247 295 468*   Dg Chest 2 View  Result Date: 02/23/2018 CLINICAL DATA:  Shortness of Breath EXAM: CHEST - 2 VIEW COMPARISON:  02/20/2018 FINDINGS: There is hyperinflation of the lungs compatible with COPD. Small left pleural effusion. Areas of scarring in the lung bases. Heart is normal size. No acute bony abnormality. IMPRESSION: COPD/chronic changes. Small left effusion. Electronically Signed   By: Rolm Baptise M.D.   On: 02/23/2018 10:23    ASSESSMENT / PLAN: Patient is a 67 y.o female with COPD and a history of MRSA pneumonia requiring tracheostomy placement in April 2018 and HFpEF who presented with acute on chronic hypercarbic respiratory failure and septic shock.  Patient was subsequently extubated and sent to SDU.  On SDU, the patient became unresponsive and an ABG was obtained that showed acute on chronic  respiratory acidosis.  Patient was placed on BiPAP and PCCM was called on consultation for intubation.  On exam, patient is on BiPAP but is easily arousable and protecting her airway.  I reviewed CXR myself, hyperinflation noted, no changes.  Discussed with PCCM-NP.  PULMONARY A: H/O COPD (no PFTs but emphysema on CT) Acute on chronic hypercarbic respiratory failure with AMS (HCO3 baseline 32, lives with a pCO2 around  60-65) Tracheostomy history (Placed in April 2018, now removed) Acute COPD exacerbation 2/2 Metapneumonvirus, finished azithromycin course  P:   Mandatory nocturnal BiPAP Will need nocturnal BiPAP at home Minimize sedating medications Prednisone 30 with taper (patient is not wheezing on exam) Duonebs Budesonide Avoid intubation if able Would recommend involvement of palliative care and discussion of code status, inappropriate to discuss now given mental status. Monitor closely on SDU for airway protection.  The patient is critically ill with multiple organ systems failure and requires high complexity decision making for assessment and support, frequent evaluation and titration of therapies, application of advanced monitoring technologies and extensive interpretation of multiple databases.   Critical Care Time devoted to patient care services described in this note is  34  Minutes. This time reflects time of care of this signee Dr Jennet Maduro. This critical care time does not reflect procedure time, or teaching time or supervisory time of PA/NP/Med student/Med Resident etc but could involve care discussion time.  Rush Farmer, M.D. [facility] Pulmonary/Critical Care Medicine. Pager: [phone]. After hours pager: [phone].  02/23/2018, 2:26 PM

## 2018-02-23 NOTE — Progress Notes (Signed)
  Echocardiogram 2D Echocardiogram has been performed.  Jennette Dubin 02/23/2018, 1:45 PM

## 2018-02-23 NOTE — Progress Notes (Signed)
Pt continues to c/o of headache but is having stomach pain paged Tylene Fantasia who ordered a second dose of ultram pt is still refusing to wear cpap after multiple attempts and education as to why it is needed

## 2018-02-23 NOTE — Progress Notes (Signed)
Patient Demographics:    Megan [name], is a 67 y.o. female, DOB - January 18, 1951, EVO:[id number]  Admit date - 02/12/2018   Admitting Physician Kandice Hams, MD  Outpatient Primary MD for the patient is [name], MD  LOS - 10   Chief Complaint  Patient presents with  . Respiratory Distress        Subjective:    Megan [name] today has no fevers, no emesis,  No chest pain, patient not compliant with CPAP/BiPAP use, became very lethargic due to noncompliance with BiPAP, even after putting her back on BiPAP for 2-1/2 hours PCO2 on ABG was still 115 with a pH of 7.25 eventually after several hours on BiPAP patient became more arousable and more awake  Assessment  & Plan :    Active Problems:   Sepsis ([place])   Acute respiratory failure with hypoxia and hypercapnia (HCC)   Malnutrition of moderate degree   Palliative care by specialist  Brief summary Pt admitted to 67 year old with past medical history relevant for HFPEF, chronic hypoxic and respiratory respiratory failure in the setting of COPD on 2 L/min PTA, history of prior MRSA pneumonia requiring tracheostomy placement in April 2018 who was admitted  on 02/12/18 with acute on chronic hypoxic and hypercapnic respiratory Failure in the setting of acute pneumonia and septic shock requiring pressors-prior to admission patient had failed prednisone and doxycycline as outpatient for COPD exacerbation . She was intubated on 02/12/18 (on admission), extubated 02/14/2018, reintubated 02/15/2018 then extubated 02/20/2018, now requiring BiPAP nightly.                  Plan:- 1)Acute on chronic hypoxic and hypercapnic respiratory failure--overall improving,  s/p extubation on 02/20/2018, patient has severe emphysema,  Respiratory Viral Panel positive for metapneumovirus  continue low-flow oxygen to keep O2 sats in the low 90s, use CPAP/BiPAP nightly, patient is at  times noncompliant with BiPAP/CPAP use, prior to admission she was on 2 L of oxygen via cannula at home, okay to taper prednisone,, patient not compliant with CPAP/BiPAP use, became very lethargic due to noncompliance with BiPAP, even after putting her back on BiPAP for 2-1/2 hours PCO2 on ABG was still 115 with a pH of 7.25 eventually after several hours on BiPAP patient became more arousable and more awake   2)H/o CAD/Prior NSTEMI- PCCM had discontinued beta-blocker presumably due to concerns about bronchial spasms, may use metoprolol as needed however, borderline elevation of troponin this admission probably related to demand ischemia secondary to #1 above, echocardiogram to rule out wall motion abnormalities pending  3)HFpEF-clinically appears stable/compensated, last known EF 55 to 60%, borderline elevation of troponin this admission probably related to demand ischemia secondary to #1 above, echocardiogram to rule out wall motion abnormalities pending  4)H/o HTN-currently on Cardizem 30 mg 3 times daily,   may use IV Hydralazine 10 mg  Every 4 hours Prn for systolic blood pressure over 160 mmhg  Code Status : Full Disposition Plan  : await PT/OT Eval, will need Bipap/Trology upon discharge- , patient not compliant with CPAP/BiPAP use, became very lethargic due to noncompliance with BiPAP, even after putting her back on BiPAP for 2-1/2 hours PCO2 on ABG was still 115 with a pH of 7.25  eventually after several hours on BiPAP patient became more arousable and more awake   Consults  :  PCCM  CULTURES: 4/14: Blood cultures with no growth to date 4/14: Tracheal aspirate no growth to date  4/14: Respiratory Viral Panel positive for metapneumovirus   ANTIBIOTICS: Vancomycin 4/14 - 4/15  Levofloxacin 4/14 - 4/16  Azithromycin 4/16 - 4/18  SIGNIFICANT EVENTS:  March treated for COPD exacerbation with Prednisone and Doxycycline >>> Again treated for COPD exacerbation with Prednisone and  Doxyccyline 02/07/18 >>> Acute on Chronic Hypercarbic Respiratory Failure requiring intubation 02/12/18 >>> Extubated on 02/14/18 >>> Re-intubated for acute on chronic hypercarbic respiratory failure 02/15/18 >>> Extubate to BiPAP 4/22 H/o of tracheostomy (Placed in April 2018, subsequently removed)  DVT Prophylaxis  :  - Heparin  Lab Results  Component Value Date   PLT 468 (H) 02/22/2018    Inpatient Medications  Scheduled Meds: . aspirin  81 mg Oral Daily  . budesonide (PULMICORT) nebulizer solution  0.5 mg Nebulization BID  . diltiazem  30 mg Oral Q8H  . heparin  5,000 Units Subcutaneous Q8H  . insulin aspart  0-9 Units Subcutaneous TID WC  . ipratropium-albuterol  3 mL Nebulization TID  . pantoprazole  40 mg Oral Daily  . polyethylene glycol  17 g Oral Daily  . predniSONE  30 mg Oral Q breakfast  . senna-docusate  1 tablet Oral BID  . sertraline  25 mg Oral Daily   Continuous Infusions: . sodium chloride 10 mL/hr at 02/21/18 0700   PRN Meds:.sodium chloride, acetaminophen, ALPRAZolam, hydrALAZINE, metoprolol tartrate, nitroGLYCERIN, ondansetron (ZOFRAN) IV, phenol    Anti-infectives (From admission, onward)   Start     Dose/Rate Route Frequency Ordered Stop   02/14/18 1015  azithromycin (ZITHROMAX) tablet 250 mg     250 mg Per Tube Daily 02/14/18 1000 02/16/18 0900   02/13/18 2200  vancomycin (VANCOCIN) IVPB 1000 mg/200 mL premix  Status:  Discontinued     1,000 mg 200 mL/hr over 60 Minutes Intravenous Every 24 hours 02/13/18 0102 02/13/18 1036   02/13/18 2200  levofloxacin (LEVAQUIN) IVPB 500 mg  Status:  Discontinued     500 mg 100 mL/hr over 60 Minutes Intravenous Every 24 hours 02/13/18 0102 02/14/18 1000   02/12/18 2145  levofloxacin (LEVAQUIN) IVPB 750 mg     750 mg 100 mL/hr over 90 Minutes Intravenous  Once 02/12/18 2132 02/13/18 0137   02/12/18 2145  vancomycin (VANCOCIN) IVPB 1000 mg/200 mL premix     1,000 mg 200 mL/hr over 60 Minutes Intravenous  Once  02/12/18 2132 02/13/18 0137        Objective:   Vitals:   02/23/18 1620 02/23/18 1807 02/23/18 1925 02/23/18 1941  BP: (!) 98/48 (!) 106/43  (!) 89/63  Pulse: 88   (!) 127  Resp: 16 15  15   Temp:    97.6 F (36.4 C)  TempSrc:    Oral  SpO2: 100%  95% 94%  Weight:      Height:        Wt Readings from Last 3 Encounters:  02/23/18 41.2 kg (90 lb 14.4 oz)  02/07/18 41.5 kg (91 lb 9.6 oz)  01/19/18 42.2 kg (93 lb)     Intake/Output Summary (Last 24 hours) at 02/23/2018 2020 Last data filed at 02/23/2018 1500 Gross per 24 hour  Intake 180 ml  Output 300 ml  Net -120 ml     Physical Exam  Gen:-  very lethargic, chronically and cachectic appearing  HEENT:- [place].AT, No sclera icterus Nose-BiPAP  neck-Supple Neck,No JVD,.  Lungs-diminished in bases , no significant wheezing at this time,  CV- S1, S2 normal Abd-  +ve B.Sounds, Abd Soft, No tenderness,    Extremity/Skin:- No  edema,   good pulses Psych-very lethargic due to high CO2  Neuro-difficult to arouse   Data Review:   Micro Results No results found for this or any previous visit (from the past 240 hour(s)).  Radiology Reports Dg Chest 2 View  Result Date: 02/23/2018 CLINICAL DATA:  Shortness of Breath EXAM: CHEST - 2 VIEW COMPARISON:  02/20/2018 FINDINGS: There is hyperinflation of the lungs compatible with COPD. Small left pleural effusion. Areas of scarring in the lung bases. Heart is normal size. No acute bony abnormality. IMPRESSION: COPD/chronic changes. Small left effusion. Electronically Signed   By: Rolm Baptise M.D.   On: 02/23/2018 10:23   Dg Chest 2 View  Result Date: 02/07/2018 CLINICAL DATA:  Cough, chest tightness, congestion, and shortness of breath for the past 2 weeks. History of COPD, former smoker. Previous episodes of pneumonia. EXAM: CHEST - 2 VIEW COMPARISON:  PA and lateral chest x-ray of July 07, 2017 FINDINGS: The lungs are hyperinflated with hemidiaphragm flattening. There is no focal  infiltrate. The heart and pulmonary vascularity are normal. The mediastinum is normal in width. There is no pleural effusion. There is calcification in the wall of the aortic arch. Nipple shadows are visible bilaterally. The bony thorax is unremarkable. IMPRESSION: Marked hyperinflation consistent with COPD. There is no acute cardiopulmonary abnormality. Thoracic aortic atherosclerosis. Electronically Signed   By: David  Martinique M.D.   On: 02/07/2018 13:07   Dg Abd 1 View  Result Date: 02/15/2018 CLINICAL DATA:  NG placement EXAM: ABDOMEN - 1 VIEW COMPARISON:  02/13/2018 FINDINGS: NG has been advanced. The tube is in the region of the gastric antrum and could be coiled in the antrum however the tip could be in the second portion the duodenum. Normal bowel gas pattern.  No ileus or obstruction IMPRESSION: NG tube is in the gastric antrum versus proximal duodenum. Normal bowel gas pattern Electronically Signed   By: Franchot Gallo M.D.   On: 02/15/2018 18:17   Dg Chest Port 1 View  Result Date: 02/20/2018 CLINICAL DATA:  Intubation EXAM: PORTABLE CHEST 1 VIEW COMPARISON:  Portable exam 1226 hours compared to 02/18/2018 FINDINGS: Endotracheal tube, nasogastric tube and RIGHT jugular line stable. Normal heart size, mediastinal contours, and pulmonary vascularity. Emphysematous and bronchitic changes consistent with COPD. No acute infiltrate, pleural effusion or pneumothorax. Bones demineralized. IMPRESSION: COPD changes without acute infiltrate. Electronically Signed   By: Lavonia Dana M.D.   On: 02/20/2018 12:44   Dg Chest Port 1 View  Result Date: 02/18/2018 CLINICAL DATA:  Endotracheally intubated. EXAM: PORTABLE CHEST 1 VIEW COMPARISON:  02/17/2018 FINDINGS: The endotracheal tube is appropriately positioned between the clavicles and carina. Enteric tube courses into the left upper abdomen with tip not imaged. Right jugular catheter terminates over the lower SVC. The cardiac silhouette is normal in size.  The lungs remain hyperinflated without evidence of airspace consolidation, edema, sizable pleural effusion, or pneumothorax. IMPRESSION: Support devices as above. Hyperinflation evidence of active cardiopulmonary disease. Electronically Signed   By: Logan Bores M.D.   On: 02/18/2018 08:50   Dg Chest Port 1 View  Result Date: 02/17/2018 CLINICAL DATA:  Assess et tube resp failure EXAM: PORTABLE CHEST 1 VIEW COMPARISON:  02/16/2018 FINDINGS: Endotracheal tube is 5.1 centimeters above the carina.  RIGHT IJ central line tip overlies the superior vena cava. Nasogastric tube is in place, tip beyond the gastroesophageal junction and off the image. Heart size is normal. Lungs are hyperinflated. No focal consolidations. No pulmonary edema. IMPRESSION: Stable lines and tubes. 1. Hyperinflation. 2.  No evidence for acute pulmonary abnormality. Electronically Signed   By: Nolon Nations M.D.   On: 02/17/2018 09:22   Dg Chest Port 1 View  Result Date: 02/16/2018 CLINICAL DATA:  Acute respiratory failure, hypoxia EXAM: PORTABLE CHEST 1 VIEW COMPARISON:  02/15/2018 FINDINGS: Endotracheal tube and right central line are unchanged. Interval placement of NG tube into the stomach. There is hyperinflation of the lungs compatible with COPD. No confluent airspace opacities or effusions. Heart is normal size. IMPRESSION: COPD.  No active disease. Interval placement of NG tube.  Other support devices stable. Electronically Signed   By: Rolm Baptise M.D.   On: 02/16/2018 09:01   Dg Chest Port 1 View  Result Date: 02/15/2018 CLINICAL DATA:  Encounter for intubation EXAM: PORTABLE CHEST 1 VIEW COMPARISON:  Yesterday FINDINGS: Right IJ central line with tip at the SVC. Endotracheal tube tip between the clavicular heads and carina. COPD with hyperinflation and interstitial coarsening. Symmetric nipple shadows. Normal heart size and mediastinal contours. Partial coverage of the bases. No visible effusion or pneumothorax.  IMPRESSION: 1. Endotracheal tube in good position. 2. COPD.  No acute superimposed finding. 3. Incomplete visualization of the bases. Electronically Signed   By: Monte Fantasia M.D.   On: 02/15/2018 15:09   Dg Chest Port 1 View  Result Date: 02/14/2018 CLINICAL DATA:  Status post intubation. EXAM: PORTABLE CHEST 1 VIEW COMPARISON:  Radiograph of February 13, 2018. FINDINGS: The heart size and mediastinal contours are within normal limits. Endotracheal and nasogastric tubes are unchanged in position. Right internal jugular catheter is unchanged in position. No pneumothorax or pleural effusion is noted. Hyperexpansion of the lungs is noted. Atherosclerosis of thoracic aorta is noted. No acute pulmonary disease is noted. The visualized skeletal structures are unremarkable. IMPRESSION: Stable support apparatus. Hyperexpansion of the lungs. No acute cardiopulmonary abnormality seen. Aortic Atherosclerosis (ICD10-I70.0). Electronically Signed   By: Marijo Conception, M.D.   On: 02/14/2018 07:11   Dg Chest Port 1 View  Result Date: 02/13/2018 CLINICAL DATA:  Shortness of breath.  Endotracheal placement. EXAM: PORTABLE CHEST 1 VIEW COMPARISON:  02/12/2018 FINDINGS: Endotracheal tube tip is 2 cm above the carina. Nasogastric tube enters the abdomen. Right internal jugular central line tip is 5 cm above the right atrium. The lungs remain hyperinflated but clear. No sign of infiltrate or collapse. Normal heart size. Aortic atherosclerosis as seen previously. IMPRESSION: Lines and tubes well positioned.  Emphysema without focal finding. Electronically Signed   By: Nelson Chimes M.D.   On: 02/13/2018 07:05   Dg Chest Portable 1 View  Result Date: 02/12/2018 CLINICAL DATA:  Endotracheal tube, central line and orogastric tube placement. EXAM: PORTABLE CHEST 1 VIEW COMPARISON:  Chest radiograph performed earlier today at 8:25 p.m. FINDINGS: The patient's endotracheal tube is seen ending 2 cm above the carina. An enteric tube  is noted extending below the diaphragm. A right IJ line is noted ending about the mid SVC. The lungs are hyperexpanded, with flattening of the diaphragms, reflecting emphysema. The lung bases are incompletely characterized on this study. No significant pleural effusion or pneumothorax is seen. The cardiomediastinal silhouette is normal in size. No acute osseous abnormalities are identified. IMPRESSION: 1. Endotracheal tube seen ending 2  cm above the carina. 2. Enteric tube noted extending below the diaphragm. 3. Right IJ line noted ending about the mid SVC. 4. Emphysema.  Lungs otherwise grossly clear. Electronically Signed   By: Garald Balding M.D.   On: 02/12/2018 23:47   Dg Chest Port 1 View  Result Date: 02/12/2018 CLINICAL DATA:  Respiratory distress EXAM: PORTABLE CHEST 1 VIEW COMPARISON:  02/07/2018 FINDINGS: Left lower lobe opacity, new/increased from the prior, suspicious for pneumonia. Right lung is essentially clear, noting hyperinflation with emphysematous changes. Possible small left pleural effusion.  No pneumothorax. The heart is normal in size. IMPRESSION: Left lower lobe opacity, suspicious for pneumonia. Electronically Signed   By: Julian Hy M.D.   On: 02/12/2018 21:08   Dg Abd Portable 1v  Result Date: 02/13/2018 CLINICAL DATA:  Encounter for orogastric (OG) tube placement EXAM: PORTABLE ABDOMEN - 1 VIEW COMPARISON:  None. FINDINGS: NG tube extends the stomach. Side port is at the GE junction. Film is overexposed the preventing evaluation of the lung parenchyma. IMPRESSION: NG tube in stomach with side port at the GE junction. Electronically Signed   By: Suzy Bouchard M.D.   On: 02/13/2018 21:17     CBC Recent Labs  Lab 02/18/18 0410 02/19/18 0510 02/20/18 0603 02/21/18 0500 02/22/18 0637  WBC 5.0 6.5 7.8 7.2 15.5*  HGB 10.0* 8.7* 8.9* 8.8* 11.2*  HCT 33.1* 28.5* 28.7* 29.3* 36.2  PLT 240 203 247 295 468*  MCV 99.1 100.4* 99.3 100.7* 102.8*  MCH 29.9 30.6 30.8  30.2 31.8  MCHC 30.2 30.5 31.0 30.0 30.9  RDW 13.8 14.0 14.2 13.7 13.3    Chemistries  Recent Labs  Lab 02/18/18 0410 02/19/18 0510 02/20/18 0603 02/21/18 0500 02/22/18 0236  NA 140 141 138 142 142  K 4.2 3.7 3.7 3.9 4.1  CL 98* 104 99* 101 96*  CO2 35* 29 34* 35* 33*  GLUCOSE 250* 179* 176* 90 75  BUN 32* 39* 35* 22* 13  CREATININE 0.70 0.74 0.65 0.54 0.57  CALCIUM 8.7* 7.5* 8.5* 8.4* 9.0   ------------------------------------------------------------------------------------------------------------------ No results for input(s): CHOL, HDL, LDLCALC, TRIG, CHOLHDL, LDLDIRECT in the last 72 hours.  Lab Results  Component Value Date   HGBA1C 5.5 01/19/2018   ------------------------------------------------------------------------------------------------------------------ No results for input(s): TSH, T4TOTAL, T3FREE, THYROIDAB in the last 72 hours.  Invalid input(s): FREET3 ------------------------------------------------------------------------------------------------------------------ No results for input(s): VITAMINB12, FOLATE, FERRITIN, TIBC, IRON, RETICCTPCT in the last 72 hours.  Coagulation profile No results for input(s): INR, PROTIME in the last 168 hours.  No results for input(s): DDIMER in the last 72 hours.  Cardiac Enzymes Recent Labs  Lab 02/19/18 0510 02/19/18 1026 02/19/18 1631  TROPONINI 0.11* 0.11* 0.10*   ------------------------------------------------------------------------------------------------------------------    Component Value Date/Time   BNP 184.2 (H) 02/12/2018 2052     Roxan Hockey M.D on 02/23/2018 at 8:20 PM  Between 7am to 7pm - Pager - [phone]  After 7pm go to [email or web address] - password [email or web address]  [facility] -  Office  [phone]   Voice Recognition Viviann Spare dictation system was used to create this note, attempts have been made to correct errors. Please contact the author with questions and/or clarifications.

## 2018-02-23 NOTE — Progress Notes (Signed)
Pt declined use of CPAP this pm due to headache.  Machine at bedside for patient use.  Will continue to offer for patient to use.

## 2018-02-23 NOTE — Progress Notes (Signed)
Pt was complaining H/a gave tylenol hour later pt still complaing and refusing to put on CPAP paged K kirby who ordered one time dose of ultram given rechecked pt 45 mins later still refusing cpap r/t H/a will continue to monitor

## 2018-02-23 NOTE — Progress Notes (Signed)
Nutrition Follow-up  DOCUMENTATION CODES:   Non-severe (moderate) malnutrition in context of chronic illness, Underweight  INTERVENTION:    Ensure Enlive po BID, each supplement provides 350 kcal and 20 grams of protein  NUTRITION DIAGNOSIS:   Moderate Malnutrition related to chronic illness(COPD) as evidenced by mild fat depletion, mild muscle depletion, moderate muscle depletion, severe muscle depletion, ongoing   GOAL:   Patient will meet greater than or equal to 90% of their needs, progressing  MONITOR:   PO intake, Supplement acceptance, Labs, Skin, Weight trends, I & O's  ASSESSMENT:   67 yo female with PMH of MI, CHF, COPD, depression, GERD, anemia, HTN, and anxiety who was admitted on 4/14 with SOB and worsening respiratory failure related to LLL PNA, requiring intubation on admission.    4/16 extubated 4/17 re-intubated  Pt extubated 4/22. Transferred out of MICU 4/23. S/p bedside swallow evaluation 4/23. SLP rec Dys 3-thin liquids. PO intake variable at 0-50% per flowsheet records.  Would benefit from addition of nutrition supplements. Labs and medications reviewed.  CBG's [id number].  Intake/Output Summary (Last 24 hours) at 02/23/2018 1244 Last data filed at 02/23/2018 0301 Gross per 24 hour  Intake 360 ml  Output 502 ml  Net -142 ml   Diet Order:  DIET DYS 3 Room service appropriate? Yes; Fluid consistency: Thin  EDUCATION NEEDS:   No education needs have been identified at this time  Skin:  Skin Assessment: Reviewed RN Assessment  Last BM:  4/22  Height:   Ht Readings from Last 1 Encounters:  02/15/18 5\' 3"  (1.6 m)   Weight:   Wt Readings from Last 1 Encounters:  02/23/18 90 lb 14.4 oz (41.2 kg)   Ideal Body Weight:  52.3 kg  BMI:  Body mass index is 16.1 kg/m.  Estimated Nutritional Needs:   Kcal:  1200-1400  Protein:  60-75 gm  Fluid:  >/= 1.5 L  Arthur Holms, RD, LDN Pager #: [phone] After-Hours Pager #: [phone]

## 2018-02-24 DIAGNOSIS — J9622 Acute and chronic respiratory failure with hypercapnia: Secondary | ICD-10-CM

## 2018-02-24 LAB — BASIC METABOLIC PANEL
Anion gap: 8 (ref 5–15)
BUN: 17 mg/dL (ref 6–20)
CO2: 43 mmol/L — AB (ref 22–32)
Calcium: 9.2 mg/dL (ref 8.9–10.3)
Chloride: 85 mmol/L — ABNORMAL LOW (ref 101–111)
Creatinine, Ser: 0.64 mg/dL (ref 0.44–1.00)
GFR calc Af Amer: >60 mL/min (ref >60)
GLUCOSE: 193 mg/dL — AB (ref 65–99)
POTASSIUM: 3.8 mmol/L (ref 3.5–5.1)
Sodium: 136 mmol/L (ref 135–145)

## 2018-02-24 LAB — GLUCOSE, CAPILLARY
GLUCOSE-CAPILLARY: 220 mg/dL — AB (ref 65–99)
GLUCOSE-CAPILLARY: 108 mg/dL — AB (ref 65–99)
Glucose-Capillary: 103 mg/dL — ABNORMAL HIGH (ref 65–99)
Glucose-Capillary: 192 mg/dL — ABNORMAL HIGH (ref 65–99)

## 2018-02-24 LAB — CBC
HEMATOCRIT: 36.1 % (ref 36.0–46.0)
Hemoglobin: 11.1 g/dL — ABNORMAL LOW (ref 12.0–15.0)
MCH: 30.7 pg (ref 26.0–34.0)
MCHC: 30.7 g/dL (ref 30.0–36.0)
MCV: 99.7 fL (ref 78.0–100.0)
Platelets: 538 10*3/uL — ABNORMAL HIGH (ref 150–400)
RBC: 3.62 MIL/uL — AB (ref 3.87–5.11)
RDW: 13.1 % (ref 11.5–15.5)
WBC: 10.1 10*3/uL (ref 4.0–10.5)

## 2018-02-24 NOTE — Progress Notes (Signed)
Patient Demographics:    Megan [name], is a 67 y.o. female, DOB - August 03, 1951, [id number]  Admit date - 02/12/2018   Admitting Physician Kandice Hams, MD  Outpatient Primary MD for the patient is [name], MD  LOS - 11   Chief Complaint  Patient presents with  . Respiratory Distress        Subjective:    Megan [name] today has no fevers, no emesis,  No chest pain, patient is more awake now because she used BiPAP overnight for over 4 hours  Assessment  & Plan :    Active Problems:   Sepsis ([place])   Acute respiratory failure with hypoxia and hypercapnia (HCC)   Malnutrition of moderate degree   Palliative care by specialist  Brief summary Pt admitted to 67 year old with past medical history relevant for HFPEF, chronic hypoxic and respiratory respiratory failure in the setting of COPD on 2 L/min PTA, history of prior MRSA pneumonia requiring tracheostomy placement in April 2018 who was admitted  on 02/12/18 with acute on chronic hypoxic and hypercapnic respiratory Failure in the setting of acute pneumonia and septic shock requiring pressors-prior to admission patient had failed prednisone and doxycycline as outpatient for COPD exacerbation . She was intubated on 02/12/18 (on admission), extubated 02/14/2018, reintubated 02/15/2018 then extubated 02/20/2018, now requiring BiPAP nightly.  Persistent severe and very very symptomatic hypercapnia , patient would definitely need  BIPAP vs NIV/Trilogy at discharge                Plan:- 1)Acute on chronic hypoxic and hypercapnic respiratory failure--overall improving,  s/p extubation on 02/20/2018, patient has severe emphysema,  Respiratory Viral Panel positive for metapneumovirus  continue low-flow oxygen to keep O2 sats in the low 90s, use CPAP/BiPAP nightly, get ABG overnight on BiPAP to help patient qualify for NIV device upon discharge home  persistent severe and very very symptomatic hypercapnia , patient would definitely need  BIPAP vs NIV/Trilogy at discharge   2)H/o CAD/Prior NSTEMI- PCCM had discontinued beta-blocker presumably due to concerns about bronchial spasms, may use metoprolol as needed however, borderline elevation of troponin this admission probably related to demand ischemia secondary to #1 above, echocardiogram to rule out wall motion abnormalities pending  3)HFpEF-clinically appears stable/compensated, last known EF 55 to 60%, borderline elevation of troponin this admission probably related to demand ischemia secondary to #1 above, echocardiogram to rule out wall motion abnormalities pending  4)H/o HTN-currently on Cardizem 30 mg 3 times daily,   may use IV Hydralazine 10 mg  Every 4 hours Prn for systolic blood pressure over 160 mmhg  Code Status : Full Disposition Plan  : PT/OT Eval, patient can be discharged with home health services,  however will need Bipap/Trology upon discharge- ,Persistent severe and very very symptomatic hypercapnia , patient would definitely need  BIPAP vs NIV/Trilogy at discharge  Consults  :  PCCM  CULTURES: 4/14: Blood cultures with no growth to date 4/14: Tracheal aspirate no growth to date  4/14: Respiratory Viral Panel positive for metapneumovirus   ANTIBIOTICS: Vancomycin 4/14 - 4/15  Levofloxacin 4/14 - 4/16  Azithromycin 4/16 - 4/18  SIGNIFICANT EVENTS:  March treated for COPD exacerbation with Prednisone and Doxycycline >>> Again treated for COPD exacerbation  with Prednisone and Doxyccyline 02/07/18 >>> Acute on Chronic Hypercarbic Respiratory Failure requiring intubation 02/12/18 >>> Extubated on 02/14/18 >>> Re-intubated for acute on chronic hypercarbic respiratory failure 02/15/18 >>> Extubate to BiPAP 4/22 H/o of tracheostomy (Placed in April 2018, subsequently removed)  DVT Prophylaxis  :  - Heparin  Lab Results  Component Value Date   PLT 538 (H) 02/24/2018      Inpatient Medications  Scheduled Meds: . aspirin  81 mg Oral Daily  . budesonide (PULMICORT) nebulizer solution  0.5 mg Nebulization BID  . diltiazem  30 mg Oral Q8H  . heparin  5,000 Units Subcutaneous Q8H  . insulin aspart  0-9 Units Subcutaneous TID WC  . ipratropium-albuterol  3 mL Nebulization TID  . pantoprazole  40 mg Oral Daily  . polyethylene glycol  17 g Oral Daily  . predniSONE  30 mg Oral Q breakfast  . senna-docusate  1 tablet Oral BID  . sertraline  25 mg Oral Daily   Continuous Infusions: . sodium chloride 10 mL/hr at 02/21/18 0700   PRN Meds:.sodium chloride, acetaminophen, ALPRAZolam, hydrALAZINE, metoprolol tartrate, nitroGLYCERIN, ondansetron (ZOFRAN) IV, phenol    Anti-infectives (From admission, onward)   Start     Dose/Rate Route Frequency Ordered Stop   02/14/18 1015  azithromycin (ZITHROMAX) tablet 250 mg     250 mg Per Tube Daily 02/14/18 1000 02/16/18 0900   02/13/18 2200  vancomycin (VANCOCIN) IVPB 1000 mg/200 mL premix  Status:  Discontinued     1,000 mg 200 mL/hr over 60 Minutes Intravenous Every 24 hours 02/13/18 0102 02/13/18 1036   02/13/18 2200  levofloxacin (LEVAQUIN) IVPB 500 mg  Status:  Discontinued     500 mg 100 mL/hr over 60 Minutes Intravenous Every 24 hours 02/13/18 0102 02/14/18 1000   02/12/18 2145  levofloxacin (LEVAQUIN) IVPB 750 mg     750 mg 100 mL/hr over 90 Minutes Intravenous  Once 02/12/18 2132 02/13/18 0137   02/12/18 2145  vancomycin (VANCOCIN) IVPB 1000 mg/200 mL premix     1,000 mg 200 mL/hr over 60 Minutes Intravenous  Once 02/12/18 2132 02/13/18 0137        Objective:   Vitals:   02/24/18 0747 02/24/18 0801 02/24/18 1138 02/24/18 1327  BP: (!) 148/71  (!) 141/67   Pulse: (!) 112  60   Resp: (!) 22  (!) 26   Temp: 98.5 F (36.9 C)  98.5 F (36.9 C)   TempSrc: Oral  Oral   SpO2: 98% 96% 90% 95%  Weight:      Height:        Wt Readings from Last 3 Encounters:  02/24/18 39 kg (86 lb)  02/07/18 41.5  kg (91 lb 9.6 oz)  01/19/18 42.2 kg (93 lb)     Intake/Output Summary (Last 24 hours) at 02/24/2018 1843 Last data filed at 02/23/2018 2030 Gross per 24 hour  Intake 120 ml  Output -  Net 120 ml   Physical Exam  Gen:-Awake and talking,, chronically and cachectic appearing HEENT:- [place].AT, No sclera icterus Nose- [place] 2 L/min neck-Supple Neck,No JVD,.  Lungs-diminished in bases , no significant wheezing at this time,  CV- S1, S2 normal Abd-  +ve B.Sounds, Abd Soft, No tenderness,    Extremity/Skin:- No  edema,   good pulses Psych-appropriate affect, oriented x3,  neuro-no new focal deficits, no tremors   Data Review:   Micro Results No results found for this or any previous visit (from the past 240 hour(s)).  Radiology  Reports Dg Chest 2 View  Result Date: 02/23/2018 CLINICAL DATA:  Shortness of Breath EXAM: CHEST - 2 VIEW COMPARISON:  02/20/2018 FINDINGS: There is hyperinflation of the lungs compatible with COPD. Small left pleural effusion. Areas of scarring in the lung bases. Heart is normal size. No acute bony abnormality. IMPRESSION: COPD/chronic changes. Small left effusion. Electronically Signed   By: Rolm Baptise M.D.   On: 02/23/2018 10:23   Dg Chest 2 View  Result Date: 02/07/2018 CLINICAL DATA:  Cough, chest tightness, congestion, and shortness of breath for the past 2 weeks. History of COPD, former smoker. Previous episodes of pneumonia. EXAM: CHEST - 2 VIEW COMPARISON:  PA and lateral chest x-ray of July 07, 2017 FINDINGS: The lungs are hyperinflated with hemidiaphragm flattening. There is no focal infiltrate. The heart and pulmonary vascularity are normal. The mediastinum is normal in width. There is no pleural effusion. There is calcification in the wall of the aortic arch. Nipple shadows are visible bilaterally. The bony thorax is unremarkable. IMPRESSION: Marked hyperinflation consistent with COPD. There is no acute cardiopulmonary abnormality. Thoracic aortic  atherosclerosis. Electronically Signed   By: David  Martinique M.D.   On: 02/07/2018 13:07   Dg Abd 1 View  Result Date: 02/15/2018 CLINICAL DATA:  NG placement EXAM: ABDOMEN - 1 VIEW COMPARISON:  02/13/2018 FINDINGS: NG has been advanced. The tube is in the region of the gastric antrum and could be coiled in the antrum however the tip could be in the second portion the duodenum. Normal bowel gas pattern.  No ileus or obstruction IMPRESSION: NG tube is in the gastric antrum versus proximal duodenum. Normal bowel gas pattern Electronically Signed   By: Franchot Gallo M.D.   On: 02/15/2018 18:17   Dg Chest Port 1 View  Result Date: 02/20/2018 CLINICAL DATA:  Intubation EXAM: PORTABLE CHEST 1 VIEW COMPARISON:  Portable exam 1226 hours compared to 02/18/2018 FINDINGS: Endotracheal tube, nasogastric tube and RIGHT jugular line stable. Normal heart size, mediastinal contours, and pulmonary vascularity. Emphysematous and bronchitic changes consistent with COPD. No acute infiltrate, pleural effusion or pneumothorax. Bones demineralized. IMPRESSION: COPD changes without acute infiltrate. Electronically Signed   By: Lavonia Dana M.D.   On: 02/20/2018 12:44   Dg Chest Port 1 View  Result Date: 02/18/2018 CLINICAL DATA:  Endotracheally intubated. EXAM: PORTABLE CHEST 1 VIEW COMPARISON:  02/17/2018 FINDINGS: The endotracheal tube is appropriately positioned between the clavicles and carina. Enteric tube courses into the left upper abdomen with tip not imaged. Right jugular catheter terminates over the lower SVC. The cardiac silhouette is normal in size. The lungs remain hyperinflated without evidence of airspace consolidation, edema, sizable pleural effusion, or pneumothorax. IMPRESSION: Support devices as above. Hyperinflation evidence of active cardiopulmonary disease. Electronically Signed   By: Logan Bores M.D.   On: 02/18/2018 08:50   Dg Chest Port 1 View  Result Date: 02/17/2018 CLINICAL DATA:  Assess et tube  resp failure EXAM: PORTABLE CHEST 1 VIEW COMPARISON:  02/16/2018 FINDINGS: Endotracheal tube is 5.1 centimeters above the carina. RIGHT IJ central line tip overlies the superior vena cava. Nasogastric tube is in place, tip beyond the gastroesophageal junction and off the image. Heart size is normal. Lungs are hyperinflated. No focal consolidations. No pulmonary edema. IMPRESSION: Stable lines and tubes. 1. Hyperinflation. 2.  No evidence for acute pulmonary abnormality. Electronically Signed   By: Nolon Nations M.D.   On: 02/17/2018 09:22   Dg Chest Port 1 View  Result Date: 02/16/2018 CLINICAL DATA:  Acute respiratory failure, hypoxia EXAM: PORTABLE CHEST 1 VIEW COMPARISON:  02/15/2018 FINDINGS: Endotracheal tube and right central line are unchanged. Interval placement of NG tube into the stomach. There is hyperinflation of the lungs compatible with COPD. No confluent airspace opacities or effusions. Heart is normal size. IMPRESSION: COPD.  No active disease. Interval placement of NG tube.  Other support devices stable. Electronically Signed   By: Rolm Baptise M.D.   On: 02/16/2018 09:01   Dg Chest Port 1 View  Result Date: 02/15/2018 CLINICAL DATA:  Encounter for intubation EXAM: PORTABLE CHEST 1 VIEW COMPARISON:  Yesterday FINDINGS: Right IJ central line with tip at the SVC. Endotracheal tube tip between the clavicular heads and carina. COPD with hyperinflation and interstitial coarsening. Symmetric nipple shadows. Normal heart size and mediastinal contours. Partial coverage of the bases. No visible effusion or pneumothorax. IMPRESSION: 1. Endotracheal tube in good position. 2. COPD.  No acute superimposed finding. 3. Incomplete visualization of the bases. Electronically Signed   By: Monte Fantasia M.D.   On: 02/15/2018 15:09   Dg Chest Port 1 View  Result Date: 02/14/2018 CLINICAL DATA:  Status post intubation. EXAM: PORTABLE CHEST 1 VIEW COMPARISON:  Radiograph of February 13, 2018. FINDINGS: The  heart size and mediastinal contours are within normal limits. Endotracheal and nasogastric tubes are unchanged in position. Right internal jugular catheter is unchanged in position. No pneumothorax or pleural effusion is noted. Hyperexpansion of the lungs is noted. Atherosclerosis of thoracic aorta is noted. No acute pulmonary disease is noted. The visualized skeletal structures are unremarkable. IMPRESSION: Stable support apparatus. Hyperexpansion of the lungs. No acute cardiopulmonary abnormality seen. Aortic Atherosclerosis (ICD10-I70.0). Electronically Signed   By: Marijo Conception, M.D.   On: 02/14/2018 07:11   Dg Chest Port 1 View  Result Date: 02/13/2018 CLINICAL DATA:  Shortness of breath.  Endotracheal placement. EXAM: PORTABLE CHEST 1 VIEW COMPARISON:  02/12/2018 FINDINGS: Endotracheal tube tip is 2 cm above the carina. Nasogastric tube enters the abdomen. Right internal jugular central line tip is 5 cm above the right atrium. The lungs remain hyperinflated but clear. No sign of infiltrate or collapse. Normal heart size. Aortic atherosclerosis as seen previously. IMPRESSION: Lines and tubes well positioned.  Emphysema without focal finding. Electronically Signed   By: Nelson Chimes M.D.   On: 02/13/2018 07:05   Dg Chest Portable 1 View  Result Date: 02/12/2018 CLINICAL DATA:  Endotracheal tube, central line and orogastric tube placement. EXAM: PORTABLE CHEST 1 VIEW COMPARISON:  Chest radiograph performed earlier today at 8:25 p.m. FINDINGS: The patient's endotracheal tube is seen ending 2 cm above the carina. An enteric tube is noted extending below the diaphragm. A right IJ line is noted ending about the mid SVC. The lungs are hyperexpanded, with flattening of the diaphragms, reflecting emphysema. The lung bases are incompletely characterized on this study. No significant pleural effusion or pneumothorax is seen. The cardiomediastinal silhouette is normal in size. No acute osseous abnormalities are  identified. IMPRESSION: 1. Endotracheal tube seen ending 2 cm above the carina. 2. Enteric tube noted extending below the diaphragm. 3. Right IJ line noted ending about the mid SVC. 4. Emphysema.  Lungs otherwise grossly clear. Electronically Signed   By: Garald Balding M.D.   On: 02/12/2018 23:47   Dg Chest Port 1 View  Result Date: 02/12/2018 CLINICAL DATA:  Respiratory distress EXAM: PORTABLE CHEST 1 VIEW COMPARISON:  02/07/2018 FINDINGS: Left lower lobe opacity, new/increased from the prior, suspicious for pneumonia. Right  lung is essentially clear, noting hyperinflation with emphysematous changes. Possible small left pleural effusion.  No pneumothorax. The heart is normal in size. IMPRESSION: Left lower lobe opacity, suspicious for pneumonia. Electronically Signed   By: Julian Hy M.D.   On: 02/12/2018 21:08   Dg Abd Portable 1v  Result Date: 02/13/2018 CLINICAL DATA:  Encounter for orogastric (OG) tube placement EXAM: PORTABLE ABDOMEN - 1 VIEW COMPARISON:  None. FINDINGS: NG tube extends the stomach. Side port is at the GE junction. Film is overexposed the preventing evaluation of the lung parenchyma. IMPRESSION: NG tube in stomach with side port at the GE junction. Electronically Signed   By: Suzy Bouchard M.D.   On: 02/13/2018 21:17     CBC Recent Labs  Lab 02/19/18 0510 02/20/18 0603 02/21/18 0500 02/22/18 0637 02/24/18 0235  WBC 6.5 7.8 7.2 15.5* 10.1  HGB 8.7* 8.9* 8.8* 11.2* 11.1*  HCT 28.5* 28.7* 29.3* 36.2 36.1  PLT 203 247 295 468* 538*  MCV 100.4* 99.3 100.7* 102.8* 99.7  MCH 30.6 30.8 30.2 31.8 30.7  MCHC 30.5 31.0 30.0 30.9 30.7  RDW 14.0 14.2 13.7 13.3 13.1    Chemistries  Recent Labs  Lab 02/19/18 0510 02/20/18 0603 02/21/18 0500 02/22/18 0236 02/24/18 0235  NA 141 138 142 142 136  K 3.7 3.7 3.9 4.1 3.8  CL 104 99* 101 96* 85*  CO2 29 34* 35* 33* 43*  GLUCOSE 179* 176* 90 75 193*  BUN 39* 35* 22* 13 17  CREATININE 0.74 0.65 0.54 0.57 0.64    CALCIUM 7.5* 8.5* 8.4* 9.0 9.2   ------------------------------------------------------------------------------------------------------------------ No results for input(s): CHOL, HDL, LDLCALC, TRIG, CHOLHDL, LDLDIRECT in the last 72 hours.  Lab Results  Component Value Date   HGBA1C 5.5 01/19/2018   ------------------------------------------------------------------------------------------------------------------ No results for input(s): TSH, T4TOTAL, T3FREE, THYROIDAB in the last 72 hours.  Invalid input(s): FREET3 ------------------------------------------------------------------------------------------------------------------ No results for input(s): VITAMINB12, FOLATE, FERRITIN, TIBC, IRON, RETICCTPCT in the last 72 hours.  Coagulation profile No results for input(s): INR, PROTIME in the last 168 hours.  No results for input(s): DDIMER in the last 72 hours.  Cardiac Enzymes Recent Labs  Lab 02/19/18 0510 02/19/18 1026 02/19/18 1631  TROPONINI 0.11* 0.11* 0.10*   ------------------------------------------------------------------------------------------------------------------    Component Value Date/Time   BNP 184.2 (H) 02/12/2018 2052     Roxan Hockey M.D on 02/24/2018 at 6:43 PM  Between 7am to 7pm - Pager - [phone]  After 7pm go to [email or web address] - password [email or web address]  [facility] -  Office  [phone]   Voice Recognition Viviann Spare dictation system was used to create this note, attempts have been made to correct errors. Please contact the author with questions and/or clarifications.

## 2018-02-24 NOTE — Care Management Note (Signed)
Case Management Note Marvetta Gibbons RN, BSN Unit 4E-Case Manager [phone]  Patient Details  Name: Terresa Marlett MRN: [id number] Date of Birth: 15-May-1951  Subjective/Objective:  Pt admitted with acute resp. Failure- septic shock requiring pressors- and intubated on 4/14- extubated on 4/17 to Bipap (hx of COPD and MRSA pna)                Action/Plan: PTA pt lived at home with children (daughter is primary caregiver ,Daughter is Randel Pigg: [phone] ( home) [phone] (cell)- per PT eval recommendations for [facility]- will need orders prior to discharge- pt would like shower chair- needs DME order- has walker and cane at home --pt also has home 02 baseline 2-3L-  CM to follow for transition of care needs.   Expected Discharge Date:                  Expected Discharge Plan:  [place]  In-House Referral:     Discharge planning Services  CM Consult  Post Acute Care Choice:  Home Health Choice offered to:     DME Arranged:    DME Agency:     HH Arranged:    [place] Agency:     Status of Service:  In process, will continue to follow  If discussed at Long Length of Stay Meetings, dates discussed:  4/23, 4/25  Discharge Disposition:   Additional Comments:  02/24/18- 1600- Marvetta Gibbons RN, CM- spoke with MD regarding need for BIPAP vs NIV at discharge- paperwork for NIV placed on shadow chart for signature- pt will need ABG on BIPAP- MD to place order- bedside RN aware and to pass off in report- spoke with Brenton Grills at [facility] regarding possible NIV/BIPAP needs for discharge- will f/u on signature/orders  Dawayne Patricia, RN 02/24/2018, 4:11 PM

## 2018-02-24 NOTE — Plan of Care (Signed)
  Problem: Health Behavior/Discharge Planning: Goal: Ability to manage health-related needs will improve Outcome: Progressing   Problem: Clinical Measurements: Goal: Will remain free from infection Outcome: Progressing Goal: Cardiovascular complication will be avoided Outcome: Progressing

## 2018-02-24 NOTE — Progress Notes (Signed)
Attempted to place pt on BIPAP for nap.  Pt only tolerated for ~4-5 minutes, then wanted the BiIpap removed.  Pt was placed on baseline settings, tried to adjust some for comfort, pt still not able to tolerate. Able to wean FIO2 with SpO2 upper 90s.  Encouraged use during naps and the necessity of using HS.  Pt alert and oriented at this time. Returned to 2L [place]

## 2018-02-24 NOTE — Progress Notes (Signed)
Pt finally agreed to wear the BiPAP around midnight, took it off around 0400 for couple of minutes about 50mims, and then put it back on till this morning. Staff will continue to encourage pt to wear BiPAP as ordered.

## 2018-02-24 NOTE — Progress Notes (Addendum)
Adm for acute hypercarbic respiratory failure 4/14, reintubated 4/17. She was extubated 4/22 to BiPAP  Respiratory viral panel showed Meta pneumo virus.  She did not use BiPAP 2 nights and developed acute respiratory acidosis again 4/25 that improved with BiPAP again. On exam-no distress, tachycardic, decreased breath sounds bilateral, no rhonchi, no edema, no accessory muscle usage. Impression/plan  COPD exacerbation-bronchospasm appears to have resolved, continue steroid taper and can transition to home bronchodilator regimen upon discharge.  Recurrent acute on chronic hypercarbic respiratory failure-she does need nocturnal ventilation ideally trilogy, but we will write for case management to provide her with nocturnal BiPAP I emphasized compliance and she does seem to understand need to use this at least 6 hours every night  PCCM available as needed  Megan [name] V. Elsworth Soho MD [phone]

## 2018-02-25 LAB — BLOOD GAS, ARTERIAL
Acid-Base Excess: 25 mmol/L — ABNORMAL HIGH (ref 0.0–2.0)
BICARBONATE: 51.2 mmol/L — AB (ref 20.0–28.0)
DRAWN BY: 52078
Delivery systems: POSITIVE
EXPIRATORY PAP: 6
FIO2: 30
Inspiratory PAP: 12
O2 SAT: 94.8 %
PATIENT TEMPERATURE: 98.6
PO2 ART: 67.6 mmHg — AB (ref 83.0–108.0)
pCO2 arterial: 74.5 mmHg (ref 32.0–48.0)
pH, Arterial: 7.452 — ABNORMAL HIGH (ref 7.350–7.450)

## 2018-02-25 LAB — GLUCOSE, CAPILLARY
Glucose-Capillary: 217 mg/dL — ABNORMAL HIGH (ref 65–99)
Glucose-Capillary: 181 mg/dL — ABNORMAL HIGH (ref 65–99)
Glucose-Capillary: 229 mg/dL — ABNORMAL HIGH (ref 65–99)
Glucose-Capillary: 153 mg/dL — ABNORMAL HIGH (ref 65–99)

## 2018-02-25 NOTE — Plan of Care (Signed)
  Problem: Health Behavior/Discharge Planning: Goal: Ability to manage health-related needs will improve Outcome: Progressing   Problem: Clinical Measurements: Goal: Ability to maintain clinical measurements within normal limits will improve Outcome: Progressing Goal: Will remain free from infection Outcome: Progressing Goal: Diagnostic test results will improve Outcome: Progressing Goal: Respiratory complications will improve Outcome: Progressing Goal: Cardiovascular complication will be avoided Outcome: Progressing   Problem: Activity: Goal: Risk for activity intolerance will decrease Outcome: Progressing   Problem: Nutrition: Goal: Adequate nutrition will be maintained Outcome: Progressing   Problem: Coping: Goal: Level of anxiety will decrease Outcome: Progressing   Problem: Elimination: Goal: Will not experience complications related to bowel motility Outcome: Progressing   Problem: Pain Managment: Goal: General experience of comfort will improve Outcome: Progressing   Problem: Skin Integrity: Goal: Risk for impaired skin integrity will decrease Outcome: Progressing

## 2018-02-25 NOTE — Progress Notes (Signed)
  Speech Language Pathology Treatment: Dysphagia  Patient Details Name: Megan [name] MRN: [id number] DOB: [birth year] Today's Date: 02/25/2018 Time: 1545-1600 SLP Time Calculation (min) (ACUTE ONLY): 15 min  Assessment / Plan / Recommendation Clinical Impression  Pt seen for follow-up today for post-extubation dysphagia. Per RN and pt she has been tolerating her diet without difficulty, although she does report diminished appetite. Pt wears dentures but reports she hasn't felt like wearing them, and prefers to continue with soft foods (dys 3) as a result. SLP provided skilled observation as pt consumed dys 3 solids, thin liquids with no overt signs of aspiration or respiratory distress, even when challenged with consecutive sips. Voice is clear. Recommend she continue current diet of dys 3, thin liquids. No further skilled ST needs identified. SLP will s/o. Pt in agreement.    HPI HPI: Patient is a 67 y.o female with COPD and a history of MRSA pneumonia requiring tracheostomy placement in April 2018 and HFpEF who presented with acute on chronic hypercarbic respiratory failure and septic shock. Pt was intubated 4/14-4/16 and then reintubated 4/17-4/22. PMH also includes MI, GERD, HTN, depression, anxiety, CAP. Pt was seen by SLP for swallowing in April 2017 with recommendations for Dys 3 diet and thin liquids. In May 2018 she was seen for PMV trials with limited tolerance, therefore swallowing was not assessed. MBS was completed while pt was at [facility] with report not available.      SLP Plan  Discharge SLP treatment due to (comment);All goals met       Recommendations  Diet recommendations: Dysphagia 3 (mechanical soft);Thin liquid Liquids provided via: Cup;Straw Medication Administration: Whole meds with liquid Supervision: Patient able to self feed;Intermittent supervision to cue for compensatory strategies Compensations: Slow rate;Small sips/bites Postural Changes and/or Swallow Maneuvers:  Seated upright 90 degrees                Oral Care Recommendations: Oral care BID Follow up Recommendations: None SLP Visit Diagnosis: Dysphagia, unspecified (R13.10) Plan: Discharge SLP treatment due to (comment);All goals met       [place], [place], [place] Pathologist [phone]   Aliene Altes 02/25/2018, 4:04 PM

## 2018-02-25 NOTE — Progress Notes (Addendum)
Patient Demographics:    Megan [name], is a 67 y.o. female, DOB - 03-Oct-1951, YKZ:[id number]  Admit date - 02/12/2018   Admitting Physician Kandice Hams, MD  Outpatient Primary MD for the patient is [name], MD  LOS - 12   Chief Complaint  Patient presents with  . Respiratory Distress        Subjective:    [name] today has no fevers, no emesis,  No chest pain, no new complaints, patient is awake and interactive because she used BiPAP last night   Assessment  & Plan :    Active Problems:   Sepsis ([place])   Acute respiratory failure with hypoxia and hypercapnia (HCC)   Malnutrition of moderate degree   Palliative care by specialist  Brief summary Pt admitted to 67 year old with past medical history relevant for HFPEF, chronic hypoxic and respiratory respiratory failure in the setting of COPD on 2 L/min PTA, history of prior MRSA pneumonia requiring tracheostomy placement in April 2018 who was admitted  on 02/12/18 with acute on chronic hypoxic and hypercapnic respiratory Failure in the setting of acute pneumonia and septic shock requiring pressors-prior to admission patient had failed prednisone and doxycycline as outpatient for COPD exacerbation . She was intubated on 02/12/18 (on admission), extubated 02/14/2018, reintubated 02/15/2018 then extubated 02/20/2018, now requiring BiPAP nightly.  Persistent severe and very very symptomatic hypercapnia , patient would definitely need  NIV/Trilogy at discharge. Patient's PCO2 on ABG was 115 despite being on BiPAP                Plan:- 1)Acute on chronic hypoxic and hypercapnic respiratory failure--overall improving,  s/p extubation on 02/20/2018, patient has severe emphysema,  Respiratory Viral Panel positive for metapneumovirus  continue low-flow oxygen to keep O2 sats in the low 90s, use CPAP/BiPAP nightly, get ABG overnight on BiPAP to help  patient qualify for NIV device upon discharge home persistent severe and very very symptomatic hypercapnia , patient would definitely need NIV/Trilogy at discharge   2)H/o CAD/Prior NSTEMI- PCCM had discontinued beta-blocker presumably due to concerns about bronchial spasms, may use metoprolol as needed however, borderline elevation of troponin this admission probably related to demand ischemia secondary to #1 above, echocardiogram to rule out wall motion abnormalities pending  3)HFpEF-clinically appears stable/compensated, last known EF 55 to 60%, borderline elevation of troponin this admission probably related to demand ischemia secondary to #1 above, echocardiogram to rule out wall motion abnormalities pending  4)H/o HTN-currently on Cardizem 30 mg 3 times daily,   may use IV Hydralazine 10 mg  Every 4 hours Prn for systolic blood pressure over 160 mmhg  NB!!!  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  Despite aggressive treatment and recent intubation and extubation, patient continues to exhibit signs of significant hypercapnia associated with chronic respiratory failure secondary to severe/end-stage COPD.  Patient requires the use of NIV both nightly and daytime to help with exacerbation periods.  The use of the NIV will treat patient's high PCO2 levels (please see ABG results on Bipap), and use of NIV can reduce risk of exacerbation in future hospitalizations when used at night and during the day.  Patient will need these advanced settings in  conjunction with the current medication regimen and aggressive pulmonary treatment: BiPAP is not an option due to his functional limitations and the severity of the patient's condition.   Failure to have an IV available for use over 24-hour could lead to death.  Over the last couple of days patient had significant episodes of severe lethargy and unresponsiveness due to very very  high CO2 levels please see ABG in EMR... Patient's PCO2 on ABG was 115 despite being on BiPAP  Code Status : Full Disposition Plan  : PT/OT Eval, patient can be discharged with home health services,  however will need NIV/Trology upon discharge- ,Persistent severe and very very symptomatic hypercapnia , patient would definitely need   NIV/Trilogy at discharge  Consults  :  PCCM  CULTURES: 4/14: Blood cultures with no growth to date 4/14: Tracheal aspirate no growth to date  4/14: Respiratory Viral Panel positive for metapneumovirus   ANTIBIOTICS: Vancomycin 4/14 - 4/15  Levofloxacin 4/14 - 4/16  Azithromycin 4/16 - 4/18  SIGNIFICANT EVENTS:  March treated for COPD exacerbation with Prednisone and Doxycycline >>> Again treated for COPD exacerbation with Prednisone and Doxyccyline 02/07/18 >>> Acute on Chronic Hypercarbic Respiratory Failure requiring intubation 02/12/18 >>> Extubated on 02/14/18 >>> Re-intubated for acute on chronic hypercarbic respiratory failure 02/15/18 >>> Extubate to BiPAP 4/22 H/o of tracheostomy (Placed in April 2018, subsequently removed)  DVT Prophylaxis  :  - Heparin  Lab Results  Component Value Date   PLT 538 (H) 02/24/2018    Inpatient Medications  Scheduled Meds: . aspirin  81 mg Oral Daily  . budesonide (PULMICORT) nebulizer solution  0.5 mg Nebulization BID  . diltiazem  30 mg Oral Q8H  . heparin  5,000 Units Subcutaneous Q8H  . insulin aspart  0-9 Units Subcutaneous TID WC  . ipratropium-albuterol  3 mL Nebulization TID  . pantoprazole  40 mg Oral Daily  . polyethylene glycol  17 g Oral Daily  . predniSONE  30 mg Oral Q breakfast  . senna-docusate  1 tablet Oral BID  . sertraline  25 mg Oral Daily   Continuous Infusions: . sodium chloride 10 mL/hr at 02/21/18 0700   PRN Meds:.sodium chloride, acetaminophen, ALPRAZolam, hydrALAZINE, metoprolol tartrate, nitroGLYCERIN, ondansetron (ZOFRAN) IV, phenol    Anti-infectives (From  admission, onward)   Start     Dose/Rate Route Frequency Ordered Stop   02/14/18 1015  azithromycin (ZITHROMAX) tablet 250 mg     250 mg Per Tube Daily 02/14/18 1000 02/16/18 0900   02/13/18 2200  vancomycin (VANCOCIN) IVPB 1000 mg/200 mL premix  Status:  Discontinued     1,000 mg 200 mL/hr over 60 Minutes Intravenous Every 24 hours 02/13/18 0102 02/13/18 1036   02/13/18 2200  levofloxacin (LEVAQUIN) IVPB 500 mg  Status:  Discontinued     500 mg 100 mL/hr over 60 Minutes Intravenous Every 24 hours 02/13/18 0102 02/14/18 1000   02/12/18 2145  levofloxacin (LEVAQUIN) IVPB 750 mg     750 mg 100 mL/hr over 90 Minutes Intravenous  Once 02/12/18 2132 02/13/18 0137   02/12/18 2145  vancomycin (VANCOCIN) IVPB 1000 mg/200 mL premix     1,000 mg 200 mL/hr over 60 Minutes Intravenous  Once 02/12/18 2132 02/13/18 0137        Objective:   Vitals:   02/25/18 0842 02/25/18 0938 02/25/18 1139 02/25/18 1407  BP:   (!) 148/60   Pulse:   (!) 119   Resp:   (!) 23   Temp:  98.6  F (37 C) 98.4 F (36.9 C)   TempSrc:  Oral Oral   SpO2: 100%  99% 99%  Weight:      Height:        Wt Readings from Last 3 Encounters:  02/25/18 38.9 kg (85 lb 12.8 oz)  02/07/18 41.5 kg (91 lb 9.6 oz)  01/19/18 42.2 kg (93 lb)     Intake/Output Summary (Last 24 hours) at 02/25/2018 1857 Last data filed at 02/25/2018 1625 Gross per 24 hour  Intake -  Output 1000 ml  Net -1000 ml   Physical Exam  Gen:-Awake and talking,, chronically and cachectic appearing HEENT:- [place].AT, No sclera icterus Nose- [place] 2 L/min neck-Supple Neck,No JVD,.  Lungs-diminished in bases , no significant wheezing at this time,  CV- S1, S2 normal Abd-  +ve B.Sounds, Abd Soft, No tenderness,    Extremity/Skin:- No  edema,   good pulses Psych-appropriate affect, oriented x3,  neuro-no new focal deficits, no tremors   Data Review:   Micro Results No results found for this or any previous visit (from the past 240 hour(s)).  Radiology  Reports Dg Chest 2 View  Result Date: 02/23/2018 CLINICAL DATA:  Shortness of Breath EXAM: CHEST - 2 VIEW COMPARISON:  02/20/2018 FINDINGS: There is hyperinflation of the lungs compatible with COPD. Small left pleural effusion. Areas of scarring in the lung bases. Heart is normal size. No acute bony abnormality. IMPRESSION: COPD/chronic changes. Small left effusion. Electronically Signed   By: Rolm Baptise M.D.   On: 02/23/2018 10:23   Dg Chest 2 View  Result Date: 02/07/2018 CLINICAL DATA:  Cough, chest tightness, congestion, and shortness of breath for the past 2 weeks. History of COPD, former smoker. Previous episodes of pneumonia. EXAM: CHEST - 2 VIEW COMPARISON:  PA and lateral chest x-ray of July 07, 2017 FINDINGS: The lungs are hyperinflated with hemidiaphragm flattening. There is no focal infiltrate. The heart and pulmonary vascularity are normal. The mediastinum is normal in width. There is no pleural effusion. There is calcification in the wall of the aortic arch. Nipple shadows are visible bilaterally. The bony thorax is unremarkable. IMPRESSION: Marked hyperinflation consistent with COPD. There is no acute cardiopulmonary abnormality. Thoracic aortic atherosclerosis. Electronically Signed   By: David  Martinique M.D.   On: 02/07/2018 13:07   Dg Abd 1 View  Result Date: 02/15/2018 CLINICAL DATA:  NG placement EXAM: ABDOMEN - 1 VIEW COMPARISON:  02/13/2018 FINDINGS: NG has been advanced. The tube is in the region of the gastric antrum and could be coiled in the antrum however the tip could be in the second portion the duodenum. Normal bowel gas pattern.  No ileus or obstruction IMPRESSION: NG tube is in the gastric antrum versus proximal duodenum. Normal bowel gas pattern Electronically Signed   By: Franchot Gallo M.D.   On: 02/15/2018 18:17   Dg Chest Port 1 View  Result Date: 02/20/2018 CLINICAL DATA:  Intubation EXAM: PORTABLE CHEST 1 VIEW COMPARISON:  Portable exam 1226 hours compared to  02/18/2018 FINDINGS: Endotracheal tube, nasogastric tube and RIGHT jugular line stable. Normal heart size, mediastinal contours, and pulmonary vascularity. Emphysematous and bronchitic changes consistent with COPD. No acute infiltrate, pleural effusion or pneumothorax. Bones demineralized. IMPRESSION: COPD changes without acute infiltrate. Electronically Signed   By: Lavonia Dana M.D.   On: 02/20/2018 12:44   Dg Chest Port 1 View  Result Date: 02/18/2018 CLINICAL DATA:  Endotracheally intubated. EXAM: PORTABLE CHEST 1 VIEW COMPARISON:  02/17/2018 FINDINGS: The endotracheal tube  is appropriately positioned between the clavicles and carina. Enteric tube courses into the left upper abdomen with tip not imaged. Right jugular catheter terminates over the lower SVC. The cardiac silhouette is normal in size. The lungs remain hyperinflated without evidence of airspace consolidation, edema, sizable pleural effusion, or pneumothorax. IMPRESSION: Support devices as above. Hyperinflation evidence of active cardiopulmonary disease. Electronically Signed   By: Logan Bores M.D.   On: 02/18/2018 08:50   Dg Chest Port 1 View  Result Date: 02/17/2018 CLINICAL DATA:  Assess et tube resp failure EXAM: PORTABLE CHEST 1 VIEW COMPARISON:  02/16/2018 FINDINGS: Endotracheal tube is 5.1 centimeters above the carina. RIGHT IJ central line tip overlies the superior vena cava. Nasogastric tube is in place, tip beyond the gastroesophageal junction and off the image. Heart size is normal. Lungs are hyperinflated. No focal consolidations. No pulmonary edema. IMPRESSION: Stable lines and tubes. 1. Hyperinflation. 2.  No evidence for acute pulmonary abnormality. Electronically Signed   By: Nolon Nations M.D.   On: 02/17/2018 09:22   Dg Chest Port 1 View  Result Date: 02/16/2018 CLINICAL DATA:  Acute respiratory failure, hypoxia EXAM: PORTABLE CHEST 1 VIEW COMPARISON:  02/15/2018 FINDINGS: Endotracheal tube and right central line are  unchanged. Interval placement of NG tube into the stomach. There is hyperinflation of the lungs compatible with COPD. No confluent airspace opacities or effusions. Heart is normal size. IMPRESSION: COPD.  No active disease. Interval placement of NG tube.  Other support devices stable. Electronically Signed   By: Rolm Baptise M.D.   On: 02/16/2018 09:01   Dg Chest Port 1 View  Result Date: 02/15/2018 CLINICAL DATA:  Encounter for intubation EXAM: PORTABLE CHEST 1 VIEW COMPARISON:  Yesterday FINDINGS: Right IJ central line with tip at the SVC. Endotracheal tube tip between the clavicular heads and carina. COPD with hyperinflation and interstitial coarsening. Symmetric nipple shadows. Normal heart size and mediastinal contours. Partial coverage of the bases. No visible effusion or pneumothorax. IMPRESSION: 1. Endotracheal tube in good position. 2. COPD.  No acute superimposed finding. 3. Incomplete visualization of the bases. Electronically Signed   By: Monte Fantasia M.D.   On: 02/15/2018 15:09   Dg Chest Port 1 View  Result Date: 02/14/2018 CLINICAL DATA:  Status post intubation. EXAM: PORTABLE CHEST 1 VIEW COMPARISON:  Radiograph of February 13, 2018. FINDINGS: The heart size and mediastinal contours are within normal limits. Endotracheal and nasogastric tubes are unchanged in position. Right internal jugular catheter is unchanged in position. No pneumothorax or pleural effusion is noted. Hyperexpansion of the lungs is noted. Atherosclerosis of thoracic aorta is noted. No acute pulmonary disease is noted. The visualized skeletal structures are unremarkable. IMPRESSION: Stable support apparatus. Hyperexpansion of the lungs. No acute cardiopulmonary abnormality seen. Aortic Atherosclerosis (ICD10-I70.0). Electronically Signed   By: Marijo Conception, M.D.   On: 02/14/2018 07:11   Dg Chest Port 1 View  Result Date: 02/13/2018 CLINICAL DATA:  Shortness of breath.  Endotracheal placement. EXAM: PORTABLE CHEST 1  VIEW COMPARISON:  02/12/2018 FINDINGS: Endotracheal tube tip is 2 cm above the carina. Nasogastric tube enters the abdomen. Right internal jugular central line tip is 5 cm above the right atrium. The lungs remain hyperinflated but clear. No sign of infiltrate or collapse. Normal heart size. Aortic atherosclerosis as seen previously. IMPRESSION: Lines and tubes well positioned.  Emphysema without focal finding. Electronically Signed   By: Nelson Chimes M.D.   On: 02/13/2018 07:05   Dg Chest Portable 1 View  Result Date: 02/12/2018 CLINICAL DATA:  Endotracheal tube, central line and orogastric tube placement. EXAM: PORTABLE CHEST 1 VIEW COMPARISON:  Chest radiograph performed earlier today at 8:25 p.m. FINDINGS: The patient's endotracheal tube is seen ending 2 cm above the carina. An enteric tube is noted extending below the diaphragm. A right IJ line is noted ending about the mid SVC. The lungs are hyperexpanded, with flattening of the diaphragms, reflecting emphysema. The lung bases are incompletely characterized on this study. No significant pleural effusion or pneumothorax is seen. The cardiomediastinal silhouette is normal in size. No acute osseous abnormalities are identified. IMPRESSION: 1. Endotracheal tube seen ending 2 cm above the carina. 2. Enteric tube noted extending below the diaphragm. 3. Right IJ line noted ending about the mid SVC. 4. Emphysema.  Lungs otherwise grossly clear. Electronically Signed   By: Garald Balding M.D.   On: 02/12/2018 23:47   Dg Chest Port 1 View  Result Date: 02/12/2018 CLINICAL DATA:  Respiratory distress EXAM: PORTABLE CHEST 1 VIEW COMPARISON:  02/07/2018 FINDINGS: Left lower lobe opacity, new/increased from the prior, suspicious for pneumonia. Right lung is essentially clear, noting hyperinflation with emphysematous changes. Possible small left pleural effusion.  No pneumothorax. The heart is normal in size. IMPRESSION: Left lower lobe opacity, suspicious for  pneumonia. Electronically Signed   By: Julian Hy M.D.   On: 02/12/2018 21:08   Dg Abd Portable 1v  Result Date: 02/13/2018 CLINICAL DATA:  Encounter for orogastric (OG) tube placement EXAM: PORTABLE ABDOMEN - 1 VIEW COMPARISON:  None. FINDINGS: NG tube extends the stomach. Side port is at the GE junction. Film is overexposed the preventing evaluation of the lung parenchyma. IMPRESSION: NG tube in stomach with side port at the GE junction. Electronically Signed   By: Suzy Bouchard M.D.   On: 02/13/2018 21:17     CBC Recent Labs  Lab 02/19/18 0510 02/20/18 0603 02/21/18 0500 02/22/18 0637 02/24/18 0235  WBC 6.5 7.8 7.2 15.5* 10.1  HGB 8.7* 8.9* 8.8* 11.2* 11.1*  HCT 28.5* 28.7* 29.3* 36.2 36.1  PLT 203 247 295 468* 538*  MCV 100.4* 99.3 100.7* 102.8* 99.7  MCH 30.6 30.8 30.2 31.8 30.7  MCHC 30.5 31.0 30.0 30.9 30.7  RDW 14.0 14.2 13.7 13.3 13.1    Chemistries  Recent Labs  Lab 02/19/18 0510 02/20/18 0603 02/21/18 0500 02/22/18 0236 02/24/18 0235  NA 141 138 142 142 136  K 3.7 3.7 3.9 4.1 3.8  CL 104 99* 101 96* 85*  CO2 29 34* 35* 33* 43*  GLUCOSE 179* 176* 90 75 193*  BUN 39* 35* 22* 13 17  CREATININE 0.74 0.65 0.54 0.57 0.64  CALCIUM 7.5* 8.5* 8.4* 9.0 9.2   ------------------------------------------------------------------------------------------------------------------ No results for input(s): CHOL, HDL, LDLCALC, TRIG, CHOLHDL, LDLDIRECT in the last 72 hours.  Lab Results  Component Value Date   HGBA1C 5.5 01/19/2018   ------------------------------------------------------------------------------------------------------------------ No results for input(s): TSH, T4TOTAL, T3FREE, THYROIDAB in the last 72 hours.  Invalid input(s): FREET3 ------------------------------------------------------------------------------------------------------------------ No results for input(s): VITAMINB12, FOLATE, FERRITIN, TIBC, IRON, RETICCTPCT in the last 72  hours.  Coagulation profile No results for input(s): INR, PROTIME in the last 168 hours.  No results for input(s): DDIMER in the last 72 hours.  Cardiac Enzymes Recent Labs  Lab 02/19/18 0510 02/19/18 1026 02/19/18 1631  TROPONINI 0.11* 0.11* 0.10*   ------------------------------------------------------------------------------------------------------------------    Component Value Date/Time   BNP 184.2 (H) 02/12/2018 2052     Roxan Hockey M.D on 02/25/2018 at 6:57  PM  Between 7am to 7pm - Pager - [phone]  After 7pm go to [email or web address] - password [email or web address]  [facility] -  Office  [phone]   Voice Recognition Viviann Spare dictation system was used to create this note, attempts have been made to correct errors. Please contact the author with questions and/or clarifications.

## 2018-02-25 NOTE — Progress Notes (Addendum)
NB!!!  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  Despite aggressive treatment and recent intubation and extubation, patient continues to exhibit signs of significant hypercapnia associated with chronic respiratory failure secondary to severe/end-stage COPD.  Patient requires the use of NIV both nightly and daytime to help with exacerbation periods.  The use of the NIV will treat patient's high PCO2 levels (please see ABG results on Bipap), and use of NIV can reduce risk of exacerbation in future hospitalizations when used at night and during the day.  Patient will need these advanced settings in conjunction with the current medication regimen and aggressive pulmonary treatment: BiPAP is not an option due to his functional limitations and the severity of the patient's condition.   Failure to have an IV available for use over 24-hour could lead to death.  Over the last couple of days patient had significant episodes of severe lethargy and unresponsiveness due to very very high CO2 levels please see ABG in EMR  Code Status : Full Disposition Plan  : PT/OT Eval, patient can be discharged with home health services,  however will need NIV/Trology upon discharge- ,Persistent severe and very very symptomatic hypercapnia , patient would definitely need   NIV/Trilogy at discharge   Patient's PCO2 on ABG was 115 despite being on BiPAP

## 2018-02-25 NOTE — Plan of Care (Signed)
  Problem: Health Behavior/Discharge Planning: Goal: Ability to manage health-related needs will improve Outcome: Progressing   Problem: Clinical Measurements: Goal: Ability to maintain clinical measurements within normal limits will improve Outcome: Progressing   

## 2018-02-25 NOTE — Progress Notes (Signed)
CRITICAL VALUE ALERT  Critical Value:  ABG ph 7.45, PCO2 74.5, pO2 67.6, Bicab 51.2  Date & Time Notied:  02/25/18 0432  Provider Notified: Kennon Holter  Orders Received/Actions taken: None. Response pending

## 2018-02-26 DIAGNOSIS — J449 Chronic obstructive pulmonary disease, unspecified: Secondary | ICD-10-CM | POA: Diagnosis present

## 2018-02-26 LAB — GLUCOSE, CAPILLARY
GLUCOSE-CAPILLARY: 247 mg/dL — AB (ref 65–99)
Glucose-Capillary: 153 mg/dL — ABNORMAL HIGH (ref 65–99)
Glucose-Capillary: 188 mg/dL — ABNORMAL HIGH (ref 65–99)
Glucose-Capillary: 125 mg/dL — ABNORMAL HIGH (ref 65–99)

## 2018-02-26 MED ORDER — DILTIAZEM HCL ER COATED BEADS 180 MG PO CP24
180.0000 mg | ORAL_CAPSULE | Freq: Every day | ORAL | Status: DC
  Administered 2018-02-26 – 2018-02-27 (×2): 180 mg via ORAL
  Filled 2018-02-26 (×2): qty 1

## 2018-02-26 NOTE — Progress Notes (Signed)
SATURATION QUALIFICATIONS: (This note is used to comply with regulatory documentation for home oxygen)  Patient Saturations on Room Air at Rest = 92%  Patient Saturations on Room Air while Ambulating = 81%  Patient Saturations on 3 Liters of oxygen while Ambulating = 92%  Please briefly explain why patient needs home oxygen:Sats will drop with little exertion Megan [name]

## 2018-02-26 NOTE — Plan of Care (Signed)
  Problem: Health Behavior/Discharge Planning: Goal: Ability to manage health-related needs will improve Outcome: Progressing   Problem: Clinical Measurements: Goal: Ability to maintain clinical measurements within normal limits will improve Outcome: Progressing Goal: Will remain free from infection Outcome: Progressing Goal: Diagnostic test results will improve Outcome: Progressing Goal: Respiratory complications will improve Outcome: Progressing Goal: Cardiovascular complication will be avoided Outcome: Progressing   Problem: Activity: Goal: Risk for activity intolerance will decrease Outcome: Progressing   Problem: Nutrition: Goal: Adequate nutrition will be maintained Outcome: Progressing   Problem: Coping: Goal: Level of anxiety will decrease Outcome: Progressing   Problem: Elimination: Goal: Will not experience complications related to bowel motility Outcome: Progressing   Problem: Pain Managment: Goal: General experience of comfort will improve Outcome: Progressing   Problem: Skin Integrity: Goal: Risk for impaired skin integrity will decrease Outcome: Progressing

## 2018-02-26 NOTE — Progress Notes (Signed)
Patient asked that Bipap be removed at 0350. Keeps complaining about the Bipap.

## 2018-02-26 NOTE — Progress Notes (Addendum)
Patient Demographics:    Megan [name], is a 67 y.o. female, DOB - [birth year], UXL:[id number]  Admit date - 02/12/2018   Admitting Physician Kandice Hams, MD  Outpatient Primary MD for the patient is [name], MD  LOS - 13   Chief Complaint  Patient presents with  . 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 She was intubated on 02/12/18 (on admission), extubated 02/14/2018, reintubated 02/15/2018 then extubated 02/20/2018, now requiring BiPAP nightly.  Persistent severe and very very symptomatic hypercapnia , patient would definitely need  NIV/Trilogy at discharge. Patient's PCO2 on ABG was 115 despite being on BiPAP                Plan:- 1)Acute on chronic hypoxic and hypercapnic respiratory failure--overall  improving,  s/p extubation on 02/20/2018, patient has severe emphysema,  Respiratory Viral Panel positive for metapneumovirus  continue low-flow oxygen to keep O2 sats in the low 90s, use CPAP/BiPAP nightly,   persistent severe and very very symptomatic hypercapnia , patient would definitely need NIV/Trilogy at discharge   2)H/o CAD/Prior NSTEMI- PCCM had discontinued beta-blocker presumably due to concerns about bronchial spasms, may use metoprolol as needed however, borderline elevation of troponin this admission probably related to demand ischemia secondary to #1 above, echocardiogram with preserved EF of 60 to 65%, no wall motion abnormalities pending  3)HFpEF-clinically appears stable/compensated, borderline elevation of troponin this admission probably related to demand ischemia secondary to #1 above, echocardiogram with preserved EF of 60 to 65%, no wall motion abnormalities pending  4)H/o HTN-BP is not at goal, tachycardia persist, currently on Cardizem 30 mg 3 times daily, switch to Cardizem CD 180 mg daily   may use IV Hydralazine 10 mg  Every 4 hours Prn for systolic blood pressure over 160 mmhg  5)Disposition Plan  : PT/OT Eval, patient can be discharged with home health services,  however will need NIV/Trology upon discharge- ,Persistent severe and very very symptomatic hypercapnia, patient would definitely need NIV/Trilogy at discharge, Patient's PCO2 on ABG was 115 despite being on BiPAP  At this time patient is awaiting approval of trilogy/NIV prior to being discharged home, otherwise she is medically  ready for discharge home  NB!!!  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  Despite aggressive treatment and recent intubation and extubation, patient continues to exhibit signs of significant hypercapnia associated with chronic respiratory failure secondary to severe/end-stage COPD.  Patient requires the  use of NIV both nightly and daytime to help with exacerbation periods.  The use of the NIV will treat patient's high PCO2 levels (please see ABG results on Bipap), and use of NIV can reduce risk of exacerbation in future hospitalizations when used at night and during the day.  Patient will need these advanced settings in conjunction with the current medication regimen and aggressive pulmonary treatment: BiPAP is not an option due to his functional limitations and the severity of the patient's condition.   Failure to have an IV available for use over 24-hour could lead to death.  Over the last couple of days patient had significant episodes of severe lethargy and unresponsiveness due to very very high CO2 levels please see ABG in EMR... Patient's PCO2 on ABG was 115 despite being on BiPAP  Code Status : Full Disposition Plan  : PT/OT Eval, patient can be discharged with home health services,  however will need NIV/Trology upon discharge- ,Persistent severe and very very symptomatic hypercapnia , patient would definitely need   NIV/Trilogy at discharge  Consults  :  PCCM  CULTURES: 4/14: Blood cultures with no growth to date 4/14: Tracheal aspirate no growth to date  4/14: Respiratory Viral Panel positive for metapneumovirus   ANTIBIOTICS: Vancomycin 4/14 - 4/15  Levofloxacin 4/14 - 4/16  Azithromycin 4/16 - 4/18  SIGNIFICANT EVENTS:  March treated for COPD exacerbation with Prednisone and Doxycycline >>> Again treated for COPD exacerbation with Prednisone and Doxyccyline 02/07/18 >>> Acute on Chronic Hypercarbic Respiratory Failure requiring intubation 02/12/18 >>> Extubated on 02/14/18 >>> Re-intubated for acute on chronic hypercarbic respiratory failure 02/15/18 >>> Extubate to BiPAP 4/22 H/o of tracheostomy (Placed in April 2018, subsequently removed)  DVT Prophylaxis  :  - Heparin  Lab Results  Component Value Date   PLT 538 (H) 02/24/2018    Inpatient Medications  Scheduled  Meds: . aspirin  81 mg Oral Daily  . budesonide (PULMICORT) nebulizer solution  0.5 mg Nebulization BID  . diltiazem  180 mg Oral Daily  . heparin  5,000 Units Subcutaneous Q8H  . insulin aspart  0-9 Units Subcutaneous TID WC  . ipratropium-albuterol  3 mL Nebulization TID  . pantoprazole  40 mg Oral Daily  . polyethylene glycol  17 g Oral Daily  . predniSONE  30 mg Oral Q breakfast  . senna-docusate  1 tablet Oral BID  . sertraline  25 mg Oral Daily   Continuous Infusions: . sodium chloride 10 mL/hr at 02/21/18 0700   PRN Meds:.sodium chloride, acetaminophen, ALPRAZolam, hydrALAZINE, metoprolol tartrate, nitroGLYCERIN, ondansetron (ZOFRAN) IV, phenol    Anti-infectives (From admission, onward)   Start     Dose/Rate Route Frequency Ordered Stop   02/14/18 1015  azithromycin (ZITHROMAX) tablet 250 mg     250 mg Per Tube Daily 02/14/18 1000 02/16/18 0900   02/13/18 2200  vancomycin (VANCOCIN) IVPB 1000 mg/200 mL premix  Status:  Discontinued     1,000 mg 200 mL/hr over 60 Minutes Intravenous Every 24 hours 02/13/18 0102 02/13/18 1036   02/13/18 2200  levofloxacin (LEVAQUIN) IVPB 500 mg  Status:  Discontinued     500 mg 100 mL/hr over 60 Minutes Intravenous Every 24  hours 02/13/18 0102 02/14/18 1000   02/12/18 2145  levofloxacin (LEVAQUIN) IVPB 750 mg     750 mg 100 mL/hr over 90 Minutes Intravenous  Once 02/12/18 2132 02/13/18 0137   02/12/18 2145  vancomycin (VANCOCIN) IVPB 1000 mg/200 mL premix     1,000 mg 200 mL/hr over 60 Minutes Intravenous  Once 02/12/18 2132 02/13/18 0137        Objective:   Vitals:   02/26/18 0729 02/26/18 0732 02/26/18 0811 02/26/18 1257  BP:   (!) 169/71   Pulse:   (!) 121   Resp:   (!) 21   Temp:   98 F (36.7 C)   TempSrc:   Oral   SpO2: 96% 93% 94% 97%  Weight:      Height:        Wt Readings from Last 3 Encounters:  02/26/18 37.9 kg (83 lb 9.6 oz)  02/07/18 41.5 kg (91 lb 9.6 oz)  01/19/18 42.2 kg (93 lb)     Intake/Output  Summary (Last 24 hours) at 02/26/2018 1544 Last data filed at 02/26/2018 0657 Gross per 24 hour  Intake -  Output 750 ml  Net -750 ml   Physical Exam  Gen:-Awake and talking,, chronically and cachectic appearing HEENT:- [place].AT, No sclera icterus Nose- [place] 2 L/min neck-Supple Neck,No JVD,.  Lungs-diminished in bases , no significant wheezing at this time,  CV- S1, S2 normal Abd-  +ve B.Sounds, Abd Soft, No tenderness,    Extremity/Skin:- No  edema,   good pulses Psych-appropriate affect, oriented x3,  neuro-no new focal deficits, no tremors   Data Review:   Micro Results No results found for this or any previous visit (from the past 240 hour(s)).  Radiology Reports Dg Chest 2 View  Result Date: 02/23/2018 CLINICAL DATA:  Shortness of Breath EXAM: CHEST - 2 VIEW COMPARISON:  02/20/2018 FINDINGS: There is hyperinflation of the lungs compatible with COPD. Small left pleural effusion. Areas of scarring in the lung bases. Heart is normal size. No acute bony abnormality. IMPRESSION: COPD/chronic changes. Small left effusion. Electronically Signed   By: Rolm Baptise M.D.   On: 02/23/2018 10:23   Dg Chest 2 View  Result Date: 02/07/2018 CLINICAL DATA:  Cough, chest tightness, congestion, and shortness of breath for the past 2 weeks. History of COPD, former smoker. Previous episodes of pneumonia. EXAM: CHEST - 2 VIEW COMPARISON:  PA and lateral chest x-ray of July 07, 2017 FINDINGS: The lungs are hyperinflated with hemidiaphragm flattening. There is no focal infiltrate. The heart and pulmonary vascularity are normal. The mediastinum is normal in width. There is no pleural effusion. There is calcification in the wall of the aortic arch. Nipple shadows are visible bilaterally. The bony thorax is unremarkable. IMPRESSION: Marked hyperinflation consistent with COPD. There is no acute cardiopulmonary abnormality. Thoracic aortic atherosclerosis. Electronically Signed   By: David  Martinique M.D.   On:  02/07/2018 13:07   Dg Abd 1 View  Result Date: 02/15/2018 CLINICAL DATA:  NG placement EXAM: ABDOMEN - 1 VIEW COMPARISON:  02/13/2018 FINDINGS: NG has been advanced. The tube is in the region of the gastric antrum and could be coiled in the antrum however the tip could be in the second portion the duodenum. Normal bowel gas pattern.  No ileus or obstruction IMPRESSION: NG tube is in the gastric antrum versus proximal duodenum. Normal bowel gas pattern Electronically Signed   By: Franchot Gallo M.D.   On: 02/15/2018 18:17   Dg Chest [facility]  1 View  Result Date: 02/20/2018 CLINICAL DATA:  Intubation EXAM: PORTABLE CHEST 1 VIEW COMPARISON:  Portable exam 1226 hours compared to 02/18/2018 FINDINGS: Endotracheal tube, nasogastric tube and RIGHT jugular line stable. Normal heart size, mediastinal contours, and pulmonary vascularity. Emphysematous and bronchitic changes consistent with COPD. No acute infiltrate, pleural effusion or pneumothorax. Bones demineralized. IMPRESSION: COPD changes without acute infiltrate. Electronically Signed   By: Lavonia Dana M.D.   On: 02/20/2018 12:44   Dg Chest Port 1 View  Result Date: 02/18/2018 CLINICAL DATA:  Endotracheally intubated. EXAM: PORTABLE CHEST 1 VIEW COMPARISON:  02/17/2018 FINDINGS: The endotracheal tube is appropriately positioned between the clavicles and carina. Enteric tube courses into the left upper abdomen with tip not imaged. Right jugular catheter terminates over the lower SVC. The cardiac silhouette is normal in size. The lungs remain hyperinflated without evidence of airspace consolidation, edema, sizable pleural effusion, or pneumothorax. IMPRESSION: Support devices as above. Hyperinflation evidence of active cardiopulmonary disease. Electronically Signed   By: Logan Bores M.D.   On: 02/18/2018 08:50   Dg Chest Port 1 View  Result Date: 02/17/2018 CLINICAL DATA:  Assess et tube resp failure EXAM: PORTABLE CHEST 1 VIEW COMPARISON:  02/16/2018  FINDINGS: Endotracheal tube is 5.1 centimeters above the carina. RIGHT IJ central line tip overlies the superior vena cava. Nasogastric tube is in place, tip beyond the gastroesophageal junction and off the image. Heart size is normal. Lungs are hyperinflated. No focal consolidations. No pulmonary edema. IMPRESSION: Stable lines and tubes. 1. Hyperinflation. 2.  No evidence for acute pulmonary abnormality. Electronically Signed   By: Nolon Nations M.D.   On: 02/17/2018 09:22   Dg Chest Port 1 View  Result Date: 02/16/2018 CLINICAL DATA:  Acute respiratory failure, hypoxia EXAM: PORTABLE CHEST 1 VIEW COMPARISON:  02/15/2018 FINDINGS: Endotracheal tube and right central line are unchanged. Interval placement of NG tube into the stomach. There is hyperinflation of the lungs compatible with COPD. No confluent airspace opacities or effusions. Heart is normal size. IMPRESSION: COPD.  No active disease. Interval placement of NG tube.  Other support devices stable. Electronically Signed   By: Rolm Baptise M.D.   On: 02/16/2018 09:01   Dg Chest Port 1 View  Result Date: 02/15/2018 CLINICAL DATA:  Encounter for intubation EXAM: PORTABLE CHEST 1 VIEW COMPARISON:  Yesterday FINDINGS: Right IJ central line with tip at the SVC. Endotracheal tube tip between the clavicular heads and carina. COPD with hyperinflation and interstitial coarsening. Symmetric nipple shadows. Normal heart size and mediastinal contours. Partial coverage of the bases. No visible effusion or pneumothorax. IMPRESSION: 1. Endotracheal tube in good position. 2. COPD.  No acute superimposed finding. 3. Incomplete visualization of the bases. Electronically Signed   By: Monte Fantasia M.D.   On: 02/15/2018 15:09   Dg Chest Port 1 View  Result Date: 02/14/2018 CLINICAL DATA:  Status post intubation. EXAM: PORTABLE CHEST 1 VIEW COMPARISON:  Radiograph of February 13, 2018. FINDINGS: The heart size and mediastinal contours are within normal limits.  Endotracheal and nasogastric tubes are unchanged in position. Right internal jugular catheter is unchanged in position. No pneumothorax or pleural effusion is noted. Hyperexpansion of the lungs is noted. Atherosclerosis of thoracic aorta is noted. No acute pulmonary disease is noted. The visualized skeletal structures are unremarkable. IMPRESSION: Stable support apparatus. Hyperexpansion of the lungs. No acute cardiopulmonary abnormality seen. Aortic Atherosclerosis (ICD10-I70.0). Electronically Signed   By: Marijo Conception, M.D.   On: 02/14/2018 07:11  Dg Chest Port 1 View  Result Date: 02/13/2018 CLINICAL DATA:  Shortness of breath.  Endotracheal placement. EXAM: PORTABLE CHEST 1 VIEW COMPARISON:  02/12/2018 FINDINGS: Endotracheal tube tip is 2 cm above the carina. Nasogastric tube enters the abdomen. Right internal jugular central line tip is 5 cm above the right atrium. The lungs remain hyperinflated but clear. No sign of infiltrate or collapse. Normal heart size. Aortic atherosclerosis as seen previously. IMPRESSION: Lines and tubes well positioned.  Emphysema without focal finding. Electronically Signed   By: Nelson Chimes M.D.   On: 02/13/2018 07:05   Dg Chest Portable 1 View  Result Date: 02/12/2018 CLINICAL DATA:  Endotracheal tube, central line and orogastric tube placement. EXAM: PORTABLE CHEST 1 VIEW COMPARISON:  Chest radiograph performed earlier today at 8:25 p.m. FINDINGS: The patient's endotracheal tube is seen ending 2 cm above the carina. An enteric tube is noted extending below the diaphragm. A right IJ line is noted ending about the mid SVC. The lungs are hyperexpanded, with flattening of the diaphragms, reflecting emphysema. The lung bases are incompletely characterized on this study. No significant pleural effusion or pneumothorax is seen. The cardiomediastinal silhouette is normal in size. No acute osseous abnormalities are identified. IMPRESSION: 1. Endotracheal tube seen ending 2 cm  above the carina. 2. Enteric tube noted extending below the diaphragm. 3. Right IJ line noted ending about the mid SVC. 4. Emphysema.  Lungs otherwise grossly clear. Electronically Signed   By: Garald Balding M.D.   On: 02/12/2018 23:47   Dg Chest Port 1 View  Result Date: 02/12/2018 CLINICAL DATA:  Respiratory distress EXAM: PORTABLE CHEST 1 VIEW COMPARISON:  02/07/2018 FINDINGS: Left lower lobe opacity, new/increased from the prior, suspicious for pneumonia. Right lung is essentially clear, noting hyperinflation with emphysematous changes. Possible small left pleural effusion.  No pneumothorax. The heart is normal in size. IMPRESSION: Left lower lobe opacity, suspicious for pneumonia. Electronically Signed   By: Julian Hy M.D.   On: 02/12/2018 21:08   Dg Abd Portable 1v  Result Date: 02/13/2018 CLINICAL DATA:  Encounter for orogastric (OG) tube placement EXAM: PORTABLE ABDOMEN - 1 VIEW COMPARISON:  None. FINDINGS: NG tube extends the stomach. Side port is at the GE junction. Film is overexposed the preventing evaluation of the lung parenchyma. IMPRESSION: NG tube in stomach with side port at the GE junction. Electronically Signed   By: Suzy Bouchard M.D.   On: 02/13/2018 21:17     CBC Recent Labs  Lab 02/20/18 0603 02/21/18 0500 02/22/18 0637 02/24/18 0235  WBC 7.8 7.2 15.5* 10.1  HGB 8.9* 8.8* 11.2* 11.1*  HCT 28.7* 29.3* 36.2 36.1  PLT 247 295 468* 538*  MCV 99.3 100.7* 102.8* 99.7  MCH 30.8 30.2 31.8 30.7  MCHC 31.0 30.0 30.9 30.7  RDW 14.2 13.7 13.3 13.1    Chemistries  Recent Labs  Lab 02/20/18 0603 02/21/18 0500 02/22/18 0236 02/24/18 0235  NA 138 142 142 136  K 3.7 3.9 4.1 3.8  CL 99* 101 96* 85*  CO2 34* 35* 33* 43*  GLUCOSE 176* 90 75 193*  BUN 35* 22* 13 17  CREATININE 0.65 0.54 0.57 0.64  CALCIUM 8.5* 8.4* 9.0 9.2   ------------------------------------------------------------------------------------------------------------------ No results for  input(s): CHOL, HDL, LDLCALC, TRIG, CHOLHDL, LDLDIRECT in the last 72 hours.  Lab Results  Component Value Date   HGBA1C 5.5 01/19/2018   ------------------------------------------------------------------------------------------------------------------ No results for input(s): TSH, T4TOTAL, T3FREE, THYROIDAB in the last 72 hours.  Invalid input(s):  FREET3 ------------------------------------------------------------------------------------------------------------------ No results for input(s): VITAMINB12, FOLATE, FERRITIN, TIBC, IRON, RETICCTPCT in the last 72 hours.  Coagulation profile No results for input(s): INR, PROTIME in the last 168 hours.  No results for input(s): DDIMER in the last 72 hours.  Cardiac Enzymes Recent Labs  Lab 02/19/18 1631  TROPONINI 0.10*   ------------------------------------------------------------------------------------------------------------------    Component Value Date/Time   BNP 184.2 (H) 02/12/2018 2052     Roxan Hockey M.D on 02/26/2018 at 3:44 PM  Between 7am to 7pm - Pager - [phone]  After 7pm go to [email or web address] - password [email or web address]  [facility] -  Office  [phone]   Voice Recognition Viviann Spare dictation system was used to create this note, attempts have been made to correct errors. Please contact the author with questions and/or clarifications.

## 2018-02-26 NOTE — Progress Notes (Signed)
Received phone call from [place] that the trilogy will be delivered to the room around 6 pm tonight. CM notified Dr. [name]. Plan is for patient to d/c home tomorrow with trilogy.

## 2018-02-26 NOTE — Progress Notes (Signed)
Transferring patient and all belongings by bed to 2W. Report given to Santiago Glad.

## 2018-02-26 NOTE — Progress Notes (Signed)
02/26/2018 4:21 PM Pt's RA O2 sat while lying in bed was 92% HR 106.  When pt stood up and took a couple steps her sats dropped to 81% HR 140's.  Pt placed back on 02 @ 3L and after a couple minutes sats where back up to 94% HR 114. [place]

## 2018-02-26 NOTE — Progress Notes (Signed)
Jessica with [facility] was in to deliver trilogy to patient and set up in room. She taught patient how to use and will follow tomorrow at home to continue teaching with patient and family.

## 2018-02-26 NOTE — Progress Notes (Signed)
Pt ready for d/c per MD,  awaiting Trilogy approval. [facility]/ Jermaine @ [phone] to provide DME: trilogy once approved. MD and nurse made aware. Whitman Hero RN,BSN,CM

## 2018-02-27 DIAGNOSIS — E440 Moderate protein-calorie malnutrition: Secondary | ICD-10-CM

## 2018-02-27 LAB — GLUCOSE, CAPILLARY
GLUCOSE-CAPILLARY: 151 mg/dL — AB (ref 65–99)
GLUCOSE-CAPILLARY: 199 mg/dL — AB (ref 65–99)
Glucose-Capillary: 103 mg/dL — ABNORMAL HIGH (ref 65–99)

## 2018-02-27 MED ORDER — UMECLIDINIUM BROMIDE 62.5 MCG/INH IN AEPB: 1.0000 | INHALATION_SPRAY | Freq: Every day | RESPIRATORY_TRACT | 1 refills | Status: DC

## 2018-02-27 MED ORDER — ALPRAZOLAM 0.25 MG PO TABS: 0.2500 mg | ORAL_TABLET | Freq: Two times a day (BID) | ORAL | 0 refills | Status: DC | PRN

## 2018-02-27 MED ORDER — DILTIAZEM HCL ER COATED BEADS 240 MG PO CP24: 240.0000 mg | ORAL_CAPSULE | Freq: Every day | ORAL | 3 refills | Status: DC

## 2018-02-27 MED ORDER — GUAIFENESIN ER 600 MG PO TB12: 600.0000 mg | ORAL_TABLET | Freq: Two times a day (BID) | ORAL | 0 refills | Status: AC

## 2018-02-27 MED ORDER — ALPRAZOLAM 0.25 MG PO TABS: 0.2500 mg | ORAL_TABLET | Freq: Two times a day (BID) | ORAL | 1 refills | Status: DC | PRN

## 2018-02-27 MED ORDER — SERTRALINE HCL 50 MG PO TABS: 50.0000 mg | ORAL_TABLET | Freq: Every day | ORAL | 2 refills | Status: DC

## 2018-02-27 MED ORDER — PANTOPRAZOLE SODIUM 40 MG PO TBEC: 40.0000 mg | DELAYED_RELEASE_TABLET | Freq: Every day | ORAL | 1 refills | Status: DC

## 2018-02-27 MED ORDER — PREDNISONE 20 MG PO TABS: 20.0000 mg | ORAL_TABLET | Freq: Every day | ORAL | 0 refills | Status: DC

## 2018-02-27 MED ORDER — METOPROLOL TARTRATE 25 MG PO TABS: 12.5000 mg | ORAL_TABLET | Freq: Two times a day (BID) | ORAL | 1 refills | Status: DC

## 2018-02-27 MED ORDER — LEVALBUTEROL HCL 1.25 MG/0.5ML IN NEBU: 1.2500 mg | INHALATION_SOLUTION | Freq: Four times a day (QID) | RESPIRATORY_TRACT | 12 refills | Status: DC

## 2018-02-27 MED ORDER — SENNOSIDES-DOCUSATE SODIUM 8.6-50 MG PO TABS: 1.0000 | ORAL_TABLET | Freq: Two times a day (BID) | ORAL | 1 refills | Status: DC

## 2018-02-27 MED ORDER — FLUTICASONE-SALMETEROL 500-50 MCG/DOSE IN AEPB: 1.0000 | INHALATION_SPRAY | Freq: Two times a day (BID) | RESPIRATORY_TRACT | 3 refills | Status: DC

## 2018-02-27 NOTE — Progress Notes (Signed)
Occupational Therapy Treatment Patient Details Name: Megan [name] MRN: [id number] DOB: 1951/09/26 Today's Date: 02/27/2018    History of present illness Patient is a 67 y.o female with COPD and a history of MRSA pneumonia requiring tracheostomy placement in April 2018 and HFpEF who presented with acute on chronic hypercarbic respiratory failure and septic shock. Pt was intubated 4/14-4/16 and then reintubated 4/17-4/22. PMH also includes MI, GERD, HTN, depression, anxiety, CAP.   OT comments  Pt reports she is discharging home today.  She is able to perform ADLs with min guard assist - supervision.  She fatigues quite rapidly with activity - reports she didn't sleep well  last night.  DOE 3/4 with minimal activity.  Reviewed energy conservation techniques    Home health OT;Supervision/Assistance - 24 hour    Equipment Recommendations  None recommended by OT    Recommendations for Other Services      Precautions / Restrictions Precautions Precautions: Fall       Mobility Bed Mobility Overal bed mobility: Independent                Transfers Overall transfer level: Needs assistance   Transfers: Sit to/from Stand;Stand Pivot Transfers Sit to Stand: Min guard Stand pivot transfers: Supervision       General transfer comment: assist for safety     Balance Overall balance assessment: Needs assistance Sitting-balance support: No upper extremity supported;Feet supported Sitting balance-Leahy Scale: Good     Standing balance support: No upper extremity supported;Single extremity supported;During functional activity Standing balance-Leahy Scale: Fair                             ADL either performed or assessed with clinical judgement   ADL Overall ADL's : Needs assistance/impaired                         Toilet Transfer: Min guard;Ambulation;Regular Toilet;Grab bars;RW   Toileting- Water quality scientist and Hygiene: Min guard;Sit to/from  stand       Functional mobility during ADLs: Supervision/safety;Min guard;Rolling walker General ADL Comments: Pt fatigued rapidly with activity      Vision       Perception     Praxis      Cognition Arousal/Alertness: Awake/alert Behavior During Therapy: WFL for tasks assessed/performed Overall Cognitive Status: Within Functional Limits for tasks assessed                                 General Comments: mildly anxious with activity         Exercises     Shoulder Instructions       General Comments pt with DOE 3/4 on 2L supplemental 02.  Reviewed energy conservation techniques with pt.  Reinforced sit when fatigued to reduce risk of falls.  Pt reports her current shower seat/bench is too large for her shower.  Discussed options for shower seats - she does not want 3in1     Pertinent Vitals/ Pain       Pain Assessment: No/denies pain  Home Living                                          Prior Functioning/Environment  Frequency  Min 2X/week        Progress Toward Goals  OT Goals(current goals can now be found in the care plan section)  Progress towards OT goals: Progressing toward goals     Plan Discharge plan remains appropriate;Equipment recommendations need to be updated    Co-evaluation                 AM-PAC PT "6 Clicks" Daily Activity     Outcome Measure   Help from another person eating meals?: None Help from another person taking care of personal grooming?: A Little Help from another person toileting, which includes using toliet, bedpan, or urinal?: A Little Help from another person bathing (including washing, rinsing, drying)?: A Little Help from another person to put on and taking off regular upper body clothing?: A Little Help from another person to put on and taking off regular lower body clothing?: A Little 6 Click Score: 19    End of Session Equipment Utilized During Treatment:  Oxygen  OT Visit Diagnosis: Other abnormalities of gait and mobility (R26.89);Muscle weakness (generalized) (M62.81)   Activity Tolerance Patient limited by fatigue   Patient Left in bed;with call bell/phone within reach;with bed alarm set   Nurse Communication Mobility status        Time: 3016-0109 OT Time Calculation (min): 17 min  Charges: OT General Charges $OT Visit: 1 Visit OT Treatments $Self Care/Home Management : 8-22 mins  [facility], OTR/L [phone]    Lucille Passy M 02/27/2018, 9:24 AM

## 2018-02-27 NOTE — Care Management Note (Signed)
Case Management Note  Patient Details  Name: Megan [name] MRN: [id number] Date of Birth: 12-28-1950  Subjective/Objective:  From home with daughter, acute/chronic resp failure with hypoxia and severe hypercapnia. Patient wants NCM to speak with daughter [name] regarding [facility] services, daughter chose [facility] from [employer].  Referral given to Butch Penny for [facility] 2 disease management , [place], [place].  Soc will begin 24-48 hrs post discharge. Jermaine with [facility] states [facility] rep will come out to patient's home this evening to teach about the NIV.  She also has oxygen in her room to go home with (already on home oxygen).  Patient has a shower chair already at home that she received in March.                                 Action/Plan:  DC home with [facility] services with [facility] when ready.  Expected Discharge Date:                  Expected Discharge Plan:  [place]  In-House Referral:     Discharge planning Services  CM Consult  Post Acute Care Choice:  Home Health, Durable Medical Equipment Choice offered to:  Patient, Adult Children  DME Arranged:  [facility] DME Agency:  [place]:  RN, PT, OT [facility] Agency:  [facility]  Status of Service:  Completed, signed off  If discussed at [place] of Stay Meetings, dates discussed:    Additional Comments:  Zenon Mayo, RN 02/27/2018, 3:21 PM

## 2018-02-27 NOTE — Progress Notes (Signed)
Physical Therapy Treatment Patient Details Name: Megan [name] MRN: [id number] DOB: 01-23-1951 Today's Date: 02/27/2018    History of Present Illness Patient is a 67 y.o female with COPD and a history of MRSA pneumonia requiring tracheostomy placement in April 2018 and HFpEF who presented with acute on chronic hypercarbic respiratory failure and septic shock. Pt was intubated 4/14-4/16 and then reintubated 4/17-4/22. PMH also includes MI, GERD, HTN, depression, anxiety, CAP.    PT Comments    Pt received in bed and agreeable to participation in therapy. Pt ambulated in room 20 feet with RW min guard assist. 3/4 DOE noted. Pt fatigues quickly. Unable to complete further gait trials due to fatigue/SOB, requiring return to supine in bed. SpO2 92% on 2 L during mobility. SpO2 95% at rest in bed on 2 L.   Follow Up Recommendations        Equipment Recommendations  Other (comment)(shower seat)    Recommendations for Other Services       Precautions / Restrictions Precautions Precautions: Fall;Other (comment) Precaution Comments: watch sats    Mobility  Bed Mobility Overal bed mobility: Independent                Transfers   Equipment used: Ambulation equipment used Transfers: Sit to/from Stand;Stand Pivot Transfers Sit to Stand: Supervision Stand pivot transfers: Supervision       General transfer comment: supervision for safety  Ambulation/Gait Ambulation/Gait assistance: Min guard Ambulation Distance (Feet): 20 Feet Assistive device: Rolling walker (2 wheeled) Gait Pattern/deviations: Step-through pattern;Decreased stride length Gait velocity: decreased   General Gait Details: Pt faitgues quickly. Ambulated on 2 L O2 with SpO2 92% and max HR 105. Upon return to bed, SpO2 95% and HR 102.    Stairs             Wheelchair Mobility    Modified Rankin (Stroke Patients Only)       Balance   Sitting-balance support: No upper extremity supported;Feet  supported Sitting balance-Leahy Scale: Good     Standing balance support: No upper extremity supported;During functional activity Standing balance-Leahy Scale: Fair                              Cognition Arousal/Alertness: Awake/alert Behavior During Therapy: WFL for tasks assessed/performed Overall Cognitive Status: Within Functional Limits for tasks assessed                                 General Comments: mildly anxious with activity       Exercises      General Comments        Pertinent Vitals/Pain Pain Assessment: No/denies pain    Home Living                      Prior Function            PT Goals (current goals can now be found in the care plan section) Acute Rehab PT Goals Patient Stated Goal: home  PT Goal Formulation: With patient Time For Goal Achievement: 03/07/18 Potential to Achieve Goals: Good Progress towards PT goals: Progressing toward goals    Frequency    Min 3X/week      PT Plan Current plan remains appropriate    Co-evaluation              AM-PAC PT "6 Clicks" Daily Activity  Outcome Measure  Difficulty turning over in bed (including adjusting bedclothes, sheets and blankets)?: None Difficulty moving from lying on back to sitting on the side of the bed? : None Difficulty sitting down on and standing up from a chair with arms (e.g., wheelchair, bedside commode, etc,.)?: None Help needed moving to and from a bed to chair (including a wheelchair)?: None Help needed walking in hospital room?: A Little Help needed climbing 3-5 steps with a railing? : A Little 6 Click Score: 22    End of Session Equipment Utilized During Treatment: Gait belt;Oxygen Activity Tolerance: Patient tolerated treatment well Patient left: in bed;with call bell/phone within reach Nurse Communication: Mobility status PT Visit Diagnosis: Unsteadiness on feet (R26.81);Muscle weakness (generalized) (M62.81)     Time:  7841-2820 PT Time Calculation (min) (ACUTE ONLY): 20 min  Charges:  $Gait Training: 8-22 mins                    G Codes:       Megan [name], PT  Office # [phone] Pager [phone]    Megan [name] 02/27/2018, 1:36 PM

## 2018-02-27 NOTE — Discharge Summary (Signed)
Megan [name], is a 67 y.o. female  DOB 14-Aug-1951  MRN [id number].  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----discharge home with trilogy/NiV machine  2)Please use your Trilogy/NiV breathing support machine every time you go to sleep-failure to use this breathing machine when you go to sleep could result in your DEATH  3) take medications as prescribed  4)Avoid tobacco exposure  5) use 2 L of supplemental oxygen via Nasal Cannula when at rest may increase oxygen to 3 or 4 L with activity   Admission Diagnosis  COPD exacerbation (HCC) [J44.1] Acute respiratory failure with hypoxia and hypercapnia (HCC) [J96.01, J96.02] Sepsis, due to unspecified organism ([place]) [A41.9] Community acquired pneumonia of left lower lobe of lung ([place]) [J18.1]   Discharge Diagnosis  COPD exacerbation ([place]) [J44.1] Acute respiratory failure with hypoxia and hypercapnia ([place]) [J96.01, J96.02] Sepsis, due to unspecified organism ([place]) [A41.9] Community acquired pneumonia of left lower lobe of lung ([place]) [J18.1]    Principal Problem:   Acute on Chronic respiratory failure with hypoxia and SEVERE Hypercapnia (HCC) Active Problems:   Essential hypertension   COPD exacerbation/COPD, very severe/End- Stage with Severe Hypercarpnia and Hypoxia   Protein-calorie malnutrition, severe   Sepsis ([place])   Palliative care by specialist   COPD, very severe/End- Stage with Severe Hypercarpnia and Hypoxia   Malnutrition of moderate degree      Past Medical History:  Diagnosis Date  . Anemia   . Anxiety state 08/20/2015  . CAP (community acquired pneumonia)   . CHF (congestive heart failure)  ([place])   . COPD (chronic obstructive pulmonary disease) ([place])   . Depression   . Essential hypertension 08/19/2015  . GERD (gastroesophageal reflux disease)   . Headache   . History of hiatal hernia   . Myocardial infarction ([place])   . Shortness of breath dyspnea     Past Surgical History:  Procedure Laterality Date  . CARDIAC CATHETERIZATION    . CARDIAC CATHETERIZATION N/A 08/26/2015   Procedure: Left Heart Cath and Coronary Angiography;  Surgeon: Jettie Booze, MD;  Location: [facility];  Service: Cardiovascular;  Laterality: N/A;       HPI  from the history and physical done on the day of admission:     CHIEF COMPLAINT:  Dyspnea  HISTORY OF PRESENT ILLNESS:   Megan [name] is a 67 y.o f with copd with hx of exacerbation requiring tracheostomy and mrsa positive respiratory cultures in April 2018, essential hypertension, HFpEF (55-60%, increased pa pressure, hypokinesis of midinferolateral and inferior myocardium 08/2015), multiple pulmonary nodules, chf PTSD, who presented with dyspnea. The patient's daughter was at bedside and reported that the patient has not been feeling well over the past few weeks, having decreased appetite, chest tightness, been fatigued, and has been coughing up green  colored sputum. On day of admission the patient was noted to have altered mental status. Daughter reports that she took 3 doses of her home xanax.   The patient has been having difficulty with breathing for the past 2 months. She was seen by her pcp in March for copd exacerbation and treated with 5 day course of prednisone 50mg  and doxycycline. She was evaluated by pulmonology (Dr. Elsworth Soho) on 02/07/18 in clinic for 2 month history of chills, fatigue, and chest tightness. During that visit Dr. Elsworth Soho diagnosed her with COPD exacerbation and prescribed her a 12 day steroid taper and 7 day course of doxycycline. The patient's symptoms did not improve with prednisone and doxycycline and she was  told by the clinic to increase her oxygen from 2L to 3L.  In the ED the patient was placed on bipap initially, but after patient's mental status worsened and abg noted worsening respiratory acidosis the patient was intubated. Patient afebrile, tachycardic, tachypnic, hypertensive, and hypoxic. Chest xray showed small amount of infiltrate concerning for left lower lobe pneumonia and therefore was started on vancomycin and levofloxacin. While in the ed the patient's pressure dropped and therefore she was started on levophed after right IJ access obtained.        Hospital Course:     Brief summary Pt admitted to 67 year old with past medical history relevant for HFPEF, chronic hypoxic and respiratory respiratory failure in the setting of COPD on 2 L/min PTA, history of prior MRSA pneumonia requiring tracheostomy placement in April 2018 who was admitted  on 02/12/18 with acute on chronic hypoxic and hypercapnic respiratory Failure in the setting of acute pneumonia and septic shock requiring pressors-prior to admission patient had failed prednisone and doxycycline as outpatient for COPD exacerbation . She was intubated on 02/12/18 (on admission), extubated 02/14/2018, reintubated 02/15/2018 then extubated 02/20/2018, now requiring BiPAP nightly.  Persistent severe and very very symptomatic hypercapnia, patient would definitely needNIV/Trilogy at discharge. Patient's PCO2 on ABG was 115 despite being on BiPAP     Plan:- 1)Acute on chronic hypoxic and hypercapnic respiratory failure--overall much improved since patient became compliant with BiPAP nightly,  s/p extubation on 02/20/2018, patient has severe emphysema,  Respiratory Viral Panel positive for metapneumovirus , continue low-flow oxygen to keep O2 sats in the low 90s, use CPAP/BiPAP nightly,   persistent severe and very very symptomatic hypercapnia, patient would definitely need NIV/Trilogy at discharge .  Discharge home with trilogy  machine   2)H/o CAD/Prior NSTEMI- PCCM had discontinued beta-blocker presumably due to concerns about bronchial spasms, may use low-dose metoprolol ,  borderline elevation of troponin this admission probably related to demand ischemia secondary to #1 above, echocardiogram with preserved EF of 60 to 65%, no wall motion abnormalities pending  3)HFpEF-clinically appears stable/compensated, borderline elevation of troponin this admission probably related to demand ischemia secondary to #1 above, echocardiogram with preserved EF of 60 to 65%, no wall motion abnormalities   4)H/o HTN-improved BP,,  discharge home Cardizem CD 240 mg daily and low-dose metoprolol      5)Disposition Plan:PT/OT Eval appreciated, discharged with home health services, however will need NIV/Trology upon discharge- ,Persistent severe and very very symptomatic hypercapnia, patient would definitely needNIV/Trilogy at discharge, Patient's PCO2 on ABG was 115 despite being on BiPAP  6)Depression/anxiety-increase Zoloft to 50 mg daily, use Xanax very sparingly due to respiratory concerns  7) moderate protein caloric malnutrition-patient has COPD cachexia, supplements advised, increased caloric intake advised  NB!!!  NIV Requirement Statement:- Patient has severe/end-stage COPD  with chronic hypoxic and hypercapnic respiratory failure.  Despite aggressive treatment and recent intubation and extubation, patient continues to exhibit signs of significant hypercapnia associated with chronic respiratory failure secondary to severe/end-stage COPD.  Patient requires the use of NIV both nightly and daytime to help with exacerbation periods.  The use of the NIV will treat patient's high PCO2 levels (please see ABG results on Bipap), and use of NIV can reduce risk of exacerbation in future hospitalizations when used at night and during the day.  Patient will need these advanced settings in conjunction with the current  medication regimen and aggressive pulmonary treatment: BiPAP is not an option due to his functional limitations and the severity of the patient's condition.   Failure to have an IV available for use over 24-hour could lead to death.  Over the last couple of days patient had significant episodes of severe lethargy and unresponsiveness due to very very high CO2 levels please see ABG in EMR... Patient's PCO2 on ABG was 115 despite being on BiPAP  Code Status : Full Disposition Plan  : PT/OT Eval appreciated, discharged with home health services,  however will need NIV/Trology upon discharge- ,Persistent severe and very very symptomatic hypercapnia, patient would definitely need NIV/Trilogy at discharge  Consults  :  PCCM  CULTURES: 4/14: Blood cultures with no growth to date 4/14: Tracheal aspirate no growth to date  4/14: Respiratory Viral Panel positive for metapneumovirus   ANTIBIOTICS: Vancomycin 4/14 - 4/15  Levofloxacin 4/14 - 4/16  Azithromycin 4/16 - 4/18  SIGNIFICANT EVENTS:  March treated for COPD exacerbation with Prednisone and Doxycycline >>> Again treated for COPD exacerbation with Prednisone and Doxyccyline 02/07/18 >>> Acute on Chronic Hypercarbic Respiratory Failure requiring intubation 02/12/18 >>> Extubated on 02/14/18 >>> Re-intubated for acute on chronic hypercarbic respiratory failure 02/15/18 >>> Extubate to BiPAP 4/22 H/o of tracheostomy (Placed in April 2018, subsequently removed)   Discharge Condition: Stable  Follow UP  [facility] Follow up.   Why:  NIV,  Contact information: [address] [place] [address] [phone]        [facility] Follow [facility].   Specialty:  Home Health Services Why:  HHRN, HHHPT, [place] Contact information: [address] [place] [address] [phone]            Diet and Activity recommendation:  As advised  Discharge  Instructions     Discharge Instructions    Call MD for:  difficulty breathing, headache or visual disturbances   Complete by:  As directed    Call MD for:  persistant dizziness or light-headedness   Complete by:  As directed    Call MD for:  persistant nausea and vomiting   Complete by:  As directed    Call MD for:  severe uncontrolled pain   Complete by:  As directed    Call MD for:  temperature >100.4   Complete by:  As directed    Diet - low sodium heart healthy   Complete by:  As directed    Discharge instructions   Complete by:  As directed    1)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----discharge home with trilogy/NiV machine  2)Please use your Trilogy/NiV breathing support machine every time you go to sleep-failure to use his breathing machine when you go to sleep could result in your DEATH  3) take medications as prescribed  4)Avoid tobacco exposure  5)  use 2 L of supplemental oxygen via Nasal Cannula when at rest may increase oxygen to 3 or 4 L with activity   Increase activity slowly   Complete by:  As directed         Discharge Medications     Allergies as of 02/27/2018      Reactions   Penicillins Rash, Hives   Has patient had a PCN reaction causing immediate rash, facial/tongue/throat swelling, SOB or lightheadedness with hypotension: No Has patient had a PCN reaction causing severe rash involving mucus membranes or skin necrosis:NO Has patient had a PCN reaction that required hospitalization No Has patient had a PCN reaction occurring within the last 10 years:NO If all of the above answers are "NO", then may proceed with Cephalosporin use.   Fentanyl And Related Other (See Comments)   Behavioral changes per daughter      Medication List    STOP taking these medications   doxycycline 100 MG tablet Commonly known as:  VIBRA-TABS     TAKE these medications   ALPRAZolam 0.25 MG tablet Commonly known as:   XANAX Take 1 tablet (0.25 mg total) by mouth 2 (two) times daily as needed. for anxiety   aspirin EC 81 MG tablet Take 81 mg by mouth daily.   budesonide 0.5 MG/2ML nebulizer solution Commonly known as:  PULMICORT Take 2 mLs (0.5 mg total) by nebulization 2 (two) times daily.   butalbital-acetaminophen-caffeine 50-325-40 MG tablet Commonly known as:  FIORICET, ESGIC Take 1 tablet by mouth 2 (two) times daily as needed for headache.   diltiazem 240 MG 24 hr capsule Commonly known as:  CARDIZEM CD Take 1 capsule (240 mg total) by mouth daily.   feeding supplement (ENSURE ENLIVE) Liqd Take 237 mLs by mouth 4 (four) times daily.   fluticasone 50 MCG/ACT nasal spray Commonly known as:  FLONASE Place 2 sprays into both nostrils daily.   Fluticasone-Salmeterol 500-50 MCG/DOSE Aepb Commonly known as:  ADVAIR DISKUS Inhale 1 puff into the lungs 2 (two) times daily. What changed:  See the new instructions.   guaiFENesin 600 MG 12 hr tablet Commonly known as:  MUCINEX Take 1 tablet (600 mg total) by mouth 2 (two) times daily for 10 days.   levalbuterol 1.25 MG/0.5ML nebulizer solution Commonly known as:  XOPENEX Take 1.25 mg by nebulization every 6 (six) hours. What changed:  Another medication with the same name was removed. Continue taking this medication, and follow the directions you see here.   levalbuterol 45 MCG/ACT inhaler Commonly known as:  XOPENEX HFA INHALE 1 TO 2 PUFFS INTO THE LUNGS EVERY 4 HOURS AS NEEDED FOR WHEEZING   loratadine 10 MG tablet Commonly known as:  CLARITIN Take 1 tablet (10 mg total) daily by mouth.   metoprolol tartrate 25 MG tablet Commonly known as:  LOPRESSOR Take 0.5 tablets (12.5 mg total) by mouth 2 (two) times daily.   pantoprazole 40 MG tablet Commonly known as:  PROTONIX Take 1 tablet (40 mg total) by mouth daily. Start taking on:  02/28/2018 What changed:  See the new instructions.   polyethylene glycol packet Commonly known as:   MIRALAX / GLYCOLAX Take 17 g by mouth 2 (two) times daily.   predniSONE 20 MG tablet Commonly known as:  DELTASONE Take 1 tablet (20 mg total) by mouth daily with breakfast. What changed:    medication strength  how much to take  how to take this  when to take this  additional instructions  senna-docusate 8.6-50 MG tablet Commonly known as:  Senokot-S Take 1 tablet by mouth 2 (two) times daily.   sertraline 50 MG tablet Commonly known as:  ZOLOFT Take 1 tablet (50 mg total) by mouth daily. What changed:    medication strength  See the new instructions.   tiZANidine 4 MG tablet Commonly known as:  ZANAFLEX TAKE 1 TABLET(4 MG) BY MOUTH EVERY 6 HOURS AS NEEDED FOR MUSCLE SPASMS   umeclidinium bromide 62.5 MCG/INH Aepb Commonly known as:  INCRUSE ELLIPTA Inhale 1 puff into the lungs daily. What changed:  See the new instructions.       Major procedures and Radiology Reports - PLEASE review detailed and final reports for all details, in brief -   Dg Chest 2 View  Result Date: 02/23/2018 CLINICAL DATA:  Shortness of Breath EXAM: CHEST - 2 VIEW COMPARISON:  02/20/2018 FINDINGS: There is hyperinflation of the lungs compatible with COPD. Small left pleural effusion. Areas of scarring in the lung bases. Heart is normal size. No acute bony abnormality. IMPRESSION: COPD/chronic changes. Small left effusion. Electronically Signed   By: Rolm Baptise M.D.   On: 02/23/2018 10:23   Dg Chest 2 View  Result Date: 02/07/2018 CLINICAL DATA:  Cough, chest tightness, congestion, and shortness of breath for the past 2 weeks. History of COPD, former smoker. Previous episodes of pneumonia. EXAM: CHEST - 2 VIEW COMPARISON:  PA and lateral chest x-ray of July 07, 2017 FINDINGS: The lungs are hyperinflated with hemidiaphragm flattening. There is no focal infiltrate. The heart and pulmonary vascularity are normal. The mediastinum is normal in width. There is no pleural effusion. There is  calcification in the wall of the aortic arch. Nipple shadows are visible bilaterally. The bony thorax is unremarkable. IMPRESSION: Marked hyperinflation consistent with COPD. There is no acute cardiopulmonary abnormality. Thoracic aortic atherosclerosis. Electronically Signed   By: David  Martinique M.D.   On: 02/07/2018 13:07   Dg Abd 1 View  Result Date: 02/15/2018 CLINICAL DATA:  NG placement EXAM: ABDOMEN - 1 VIEW COMPARISON:  02/13/2018 FINDINGS: NG has been advanced. The tube is in the region of the gastric antrum and could be coiled in the antrum however the tip could be in the second portion the duodenum. Normal bowel gas pattern.  No ileus or obstruction IMPRESSION: NG tube is in the gastric antrum versus proximal duodenum. Normal bowel gas pattern Electronically Signed   By: Franchot Gallo M.D.   On: 02/15/2018 18:17   Dg Chest Port 1 View  Result Date: 02/20/2018 CLINICAL DATA:  Intubation EXAM: PORTABLE CHEST 1 VIEW COMPARISON:  Portable exam 1226 hours compared to 02/18/2018 FINDINGS: Endotracheal tube, nasogastric tube and RIGHT jugular line stable. Normal heart size, mediastinal contours, and pulmonary vascularity. Emphysematous and bronchitic changes consistent with COPD. No acute infiltrate, pleural effusion or pneumothorax. Bones demineralized. IMPRESSION: COPD changes without acute infiltrate. Electronically Signed   By: Lavonia Dana M.D.   On: 02/20/2018 12:44   Dg Chest Port 1 View  Result Date: 02/18/2018 CLINICAL DATA:  Endotracheally intubated. EXAM: PORTABLE CHEST 1 VIEW COMPARISON:  02/17/2018 FINDINGS: The endotracheal tube is appropriately positioned between the clavicles and carina. Enteric tube courses into the left upper abdomen with tip not imaged. Right jugular catheter terminates over the lower SVC. The cardiac silhouette is normal in size. The lungs remain hyperinflated without evidence of airspace consolidation, edema, sizable pleural effusion, or pneumothorax. IMPRESSION:  Support devices as above. Hyperinflation evidence of active cardiopulmonary disease. Electronically Signed  By: Logan Bores M.D.   On: 02/18/2018 08:50   Dg Chest Port 1 View  Result Date: 02/17/2018 CLINICAL DATA:  Assess et tube resp failure EXAM: PORTABLE CHEST 1 VIEW COMPARISON:  02/16/2018 FINDINGS: Endotracheal tube is 5.1 centimeters above the carina. RIGHT IJ central line tip overlies the superior vena cava. Nasogastric tube is in place, tip beyond the gastroesophageal junction and off the image. Heart size is normal. Lungs are hyperinflated. No focal consolidations. No pulmonary edema. IMPRESSION: Stable lines and tubes. 1. Hyperinflation. 2.  No evidence for acute pulmonary abnormality. Electronically Signed   By: Nolon Nations M.D.   On: 02/17/2018 09:22   Dg Chest Port 1 View  Result Date: 02/16/2018 CLINICAL DATA:  Acute respiratory failure, hypoxia EXAM: PORTABLE CHEST 1 VIEW COMPARISON:  02/15/2018 FINDINGS: Endotracheal tube and right central line are unchanged. Interval placement of NG tube into the stomach. There is hyperinflation of the lungs compatible with COPD. No confluent airspace opacities or effusions. Heart is normal size. IMPRESSION: COPD.  No active disease. Interval placement of NG tube.  Other support devices stable. Electronically Signed   By: Rolm Baptise M.D.   On: 02/16/2018 09:01   Dg Chest Port 1 View  Result Date: 02/15/2018 CLINICAL DATA:  Encounter for intubation EXAM: PORTABLE CHEST 1 VIEW COMPARISON:  Yesterday FINDINGS: Right IJ central line with tip at the SVC. Endotracheal tube tip between the clavicular heads and carina. COPD with hyperinflation and interstitial coarsening. Symmetric nipple shadows. Normal heart size and mediastinal contours. Partial coverage of the bases. No visible effusion or pneumothorax. IMPRESSION: 1. Endotracheal tube in good position. 2. COPD.  No acute superimposed finding. 3. Incomplete visualization of the bases.  Electronically Signed   By: Monte Fantasia M.D.   On: 02/15/2018 15:09   Dg Chest Port 1 View  Result Date: 02/14/2018 CLINICAL DATA:  Status post intubation. EXAM: PORTABLE CHEST 1 VIEW COMPARISON:  Radiograph of February 13, 2018. FINDINGS: The heart size and mediastinal contours are within normal limits. Endotracheal and nasogastric tubes are unchanged in position. Right internal jugular catheter is unchanged in position. No pneumothorax or pleural effusion is noted. Hyperexpansion of the lungs is noted. Atherosclerosis of thoracic aorta is noted. No acute pulmonary disease is noted. The visualized skeletal structures are unremarkable. IMPRESSION: Stable support apparatus. Hyperexpansion of the lungs. No acute cardiopulmonary abnormality seen. Aortic Atherosclerosis (ICD10-I70.0). Electronically Signed   By: Marijo Conception, M.D.   On: 02/14/2018 07:11   Dg Chest Port 1 View  Result Date: 02/13/2018 CLINICAL DATA:  Shortness of breath.  Endotracheal placement. EXAM: PORTABLE CHEST 1 VIEW COMPARISON:  02/12/2018 FINDINGS: Endotracheal tube tip is 2 cm above the carina. Nasogastric tube enters the abdomen. Right internal jugular central line tip is 5 cm above the right atrium. The lungs remain hyperinflated but clear. No sign of infiltrate or collapse. Normal heart size. Aortic atherosclerosis as seen previously. IMPRESSION: Lines and tubes well positioned.  Emphysema without focal finding. Electronically Signed   By: Nelson Chimes M.D.   On: 02/13/2018 07:05   Dg Chest Portable 1 View  Result Date: 02/12/2018 CLINICAL DATA:  Endotracheal tube, central line and orogastric tube placement. EXAM: PORTABLE CHEST 1 VIEW COMPARISON:  Chest radiograph performed earlier [name] at 8:25 p.m. FINDINGS: The patient's endotracheal tube is seen ending 2 cm above the carina. An enteric tube is noted extending below the diaphragm. A right IJ line is noted ending about the mid SVC. The lungs are  hyperexpanded, with  flattening of the diaphragms, reflecting emphysema. The lung bases are incompletely characterized on this study. No significant pleural effusion or pneumothorax is seen. The cardiomediastinal silhouette is normal in size. No acute osseous abnormalities are identified. IMPRESSION: 1. Endotracheal tube seen ending 2 cm above the carina. 2. Enteric tube noted extending below the diaphragm. 3. Right IJ line noted ending about the mid SVC. 4. Emphysema.  Lungs otherwise grossly clear. Electronically Signed   By: Garald Balding M.D.   On: 02/12/2018 23:47   Dg Chest Port 1 View  Result Date: 02/12/2018 CLINICAL DATA:  Respiratory distress EXAM: PORTABLE CHEST 1 VIEW COMPARISON:  02/07/2018 FINDINGS: Left lower lobe opacity, new/increased from the prior, suspicious for pneumonia. Right lung is essentially clear, noting hyperinflation with emphysematous changes. Possible small left pleural effusion.  No pneumothorax. The heart is normal in size. IMPRESSION: Left lower lobe opacity, suspicious for pneumonia. Electronically Signed   By: Julian Hy M.D.   On: 02/12/2018 21:08   Dg Abd Portable 1v  Result Date: 02/13/2018 CLINICAL DATA:  Encounter for orogastric (OG) tube placement EXAM: PORTABLE ABDOMEN - 1 VIEW COMPARISON:  None. FINDINGS: NG tube extends the stomach. Side port is at the GE junction. Film is overexposed the preventing evaluation of the lung parenchyma. IMPRESSION: NG tube in stomach with side port at the GE junction. Electronically Signed   By: Suzy Bouchard M.D.   On: 02/13/2018 21:17    Micro Results    No results found for this or any previous visit (from the past 240 hour(s)).     [name]   Subjective    Megan [name] has no complaints, breathing better with BiPAP use more awake more alert more interactive          Patient has been seen and examined prior to discharge   Objective   Blood pressure (!) 132/56, pulse 87, temperature 98.6 F (37 C), temperature  source Oral, resp. rate 16, height 5\' 3"  (1.6 m), weight 38.9 kg (85 lb 12.1 oz), SpO2 100 %.   Intake/Output Summary (Last 24 hours) at 02/27/2018 1731 Last data filed at 02/27/2018 0309 Gross per 24 hour  Intake 240 ml  Output -  Net 240 ml    Exam Gen:-Awake and talking,, chronically and cachectic appearing HEENT:- [place].AT, No sclera icterus Nose- [place] 2 L/min neck-Supple Neck,No JVD,.  Lungs-improved air movement, no significant wheezing at this time,  CV- S1, S2 normal Abd-  +ve B.Sounds, Abd Soft, No tenderness,    Extremity/Skin:- No  edema,   good pulses Psych-appropriate affect, oriented x3,  neuro-no new focal deficits, no tremors     Data Review   CBC w Diff:  Lab Results  Component Value Date   WBC 10.1 02/24/2018   HGB 11.1 (L) 02/24/2018   HCT 36.1 02/24/2018   PLT 538 (H) 02/24/2018   LYMPHOPCT 24 02/12/2018   MONOPCT 11 02/12/2018   EOSPCT 0 02/12/2018   BASOPCT 0 02/12/2018    CMP:  Lab Results  Component Value Date   NA 136 02/24/2018   K 3.8 02/24/2018   CL 85 (L) 02/24/2018   CO2 43 (H) 02/24/2018   BUN 17 02/24/2018   CREATININE 0.64 02/24/2018   PROT 6.8 01/04/2018   ALBUMIN 4.1 01/04/2018   BILITOT 0.3 01/04/2018   ALKPHOS 66 01/04/2018   AST 14 01/04/2018   ALT 12 01/04/2018  .   Total Discharge time is about 33 minutes  Megan [name]  M.D on 02/27/2018 at 5:31 PM  [facility]   Office  [phone]  Voice Recognition Viviann Spare dictation system was used to create this note, attempts have been made to correct errors. Please contact the author with questions and/or clarifications.

## 2018-02-27 NOTE — Care Management Note (Addendum)
Case Management Note  Patient Details  Name: Megan [name] MRN: [id number] Date of Birth: [birth year]  Subjective/Objective:   From home with daughter, acute/chronic resp failure with hypoxia and severe hypercapnia. Patient wants NCM to speak with daughter [name] regarding [facility] services, daughter chose [facility] from [employer].  Referral given to Butch Penny for [facility] disease management , [place].  Soc will begin 24-48 hrs post discharge. Jermaine with [facility] states [facility] rep will come out to patient's home this evening to teach about the NIV.  She also has oxygen in her room to go home with (already on home oxygen).  Patient has a shower chair already at home that she received in March.              Action/Plan: Discharge home with [facility] services with [facility].  Expected Discharge Date:                  Expected Discharge Plan:  [place]  In-House Referral:     Discharge planning Services  CM Consult  Post Acute Care Choice:  Home Health, Durable Medical Equipment Choice offered to:  Patient, Adult Children  DME Arranged:  NIV, Shower stool DME Agency:  [place]:  RN, PT, OT [facility] Agency:  [place]  Status of Service:  Completed, signed off  If discussed at [place] of Stay Meetings, dates discussed:    Additional Comments:  Zenon Mayo, RN 02/27/2018, 2:31 PM

## 2018-02-27 NOTE — Discharge Instructions (Signed)
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

## 2018-02-27 NOTE — Care Management Important Message (Signed)
Important Message  Patient Details  Name: Megan [name] MRN: [id number] Date of Birth: 1951-01-14   Medicare Important Message Given:  Yes    Orbie Pyo 02/27/2018, 2:38 PM

## 2018-03-01 DIAGNOSIS — F4310 Post-traumatic stress disorder, unspecified: Secondary | ICD-10-CM | POA: Diagnosis not present

## 2018-03-01 DIAGNOSIS — Z87891 Personal history of nicotine dependence: Secondary | ICD-10-CM | POA: Diagnosis not present

## 2018-03-01 DIAGNOSIS — R918 Other nonspecific abnormal finding of lung field: Secondary | ICD-10-CM | POA: Diagnosis not present

## 2018-03-01 DIAGNOSIS — F329 Major depressive disorder, single episode, unspecified: Secondary | ICD-10-CM | POA: Diagnosis not present

## 2018-03-01 DIAGNOSIS — I110 Hypertensive heart disease with heart failure: Secondary | ICD-10-CM | POA: Diagnosis not present

## 2018-03-01 DIAGNOSIS — I5030 Unspecified diastolic (congestive) heart failure: Secondary | ICD-10-CM | POA: Diagnosis not present

## 2018-03-01 DIAGNOSIS — J441 Chronic obstructive pulmonary disease with (acute) exacerbation: Secondary | ICD-10-CM | POA: Diagnosis not present

## 2018-03-01 DIAGNOSIS — Z7982 Long term (current) use of aspirin: Secondary | ICD-10-CM | POA: Diagnosis not present

## 2018-03-01 DIAGNOSIS — J9602 Acute respiratory failure with hypercapnia: Secondary | ICD-10-CM | POA: Diagnosis not present

## 2018-03-01 DIAGNOSIS — Z7951 Long term (current) use of inhaled steroids: Secondary | ICD-10-CM | POA: Diagnosis not present

## 2018-03-01 DIAGNOSIS — I2510 Atherosclerotic heart disease of native coronary artery without angina pectoris: Secondary | ICD-10-CM | POA: Diagnosis not present

## 2018-03-01 DIAGNOSIS — J9601 Acute respiratory failure with hypoxia: Secondary | ICD-10-CM | POA: Diagnosis not present

## 2018-03-01 DIAGNOSIS — K219 Gastro-esophageal reflux disease without esophagitis: Secondary | ICD-10-CM | POA: Diagnosis not present

## 2018-03-01 DIAGNOSIS — Z8701 Personal history of pneumonia (recurrent): Secondary | ICD-10-CM | POA: Diagnosis not present

## 2018-03-01 DIAGNOSIS — D649 Anemia, unspecified: Secondary | ICD-10-CM | POA: Diagnosis not present

## 2018-03-01 DIAGNOSIS — E43 Unspecified severe protein-calorie malnutrition: Secondary | ICD-10-CM | POA: Diagnosis not present

## 2018-03-03 ENCOUNTER — Ambulatory Visit: Payer: Medicare Other | Admitting: Emergency Medicine

## 2018-03-06 ENCOUNTER — Telehealth: Payer: Self-pay | Admitting: Emergency Medicine

## 2018-03-06 NOTE — Telephone Encounter (Signed)
lmtcb x 1 for Emmilee with [facility].

## 2018-03-07 DIAGNOSIS — J441 Chronic obstructive pulmonary disease with (acute) exacerbation: Secondary | ICD-10-CM | POA: Diagnosis not present

## 2018-03-07 DIAGNOSIS — D649 Anemia, unspecified: Secondary | ICD-10-CM | POA: Diagnosis not present

## 2018-03-07 DIAGNOSIS — I110 Hypertensive heart disease with heart failure: Secondary | ICD-10-CM | POA: Diagnosis not present

## 2018-03-07 DIAGNOSIS — I5030 Unspecified diastolic (congestive) heart failure: Secondary | ICD-10-CM | POA: Diagnosis not present

## 2018-03-07 DIAGNOSIS — I2510 Atherosclerotic heart disease of native coronary artery without angina pectoris: Secondary | ICD-10-CM | POA: Diagnosis not present

## 2018-03-07 DIAGNOSIS — E43 Unspecified severe protein-calorie malnutrition: Secondary | ICD-10-CM | POA: Diagnosis not present

## 2018-03-07 NOTE — Telephone Encounter (Signed)
Per Ria Comment since patient has not been seen by [facility] in over two years for routine care, the verbals will have to come from PCP.  Called and spoke with Glenard Haring and advised her of this. Nothing further needed.

## 2018-03-07 NOTE — Telephone Encounter (Signed)
Megan [name] is returning call. Cb is [phone]. Per Megan [name], please leave detailed msg on VM.

## 2018-03-07 NOTE — Telephone Encounter (Signed)
Attempted to call [facility] with [facility]. I did not receive an answer, call went straight to voicemail. I have left a message for Glenard Haring to return our call.

## 2018-03-09 ENCOUNTER — Telehealth: Payer: Self-pay | Admitting: Internal Medicine

## 2018-03-09 NOTE — Telephone Encounter (Signed)
Copied from [place] [phone]. Topic: Quick Communication - See Telephone Encounter >> Mar 09, 2018  9:23 AM Ether Griffins B wrote: CRM for notification. See Telephone encounter for: 03/09/18.  Glenard Haring RN with [facility] calling requesting verbal orders for: skilled nursing 2 x 2, 1 x 7 / Education officer, museum visit / PT, OT and Speech evaluation / home health aid 2 x 1, 1 x 2. CB# [phone] ok to leave detailed VM.

## 2018-03-09 NOTE — Telephone Encounter (Signed)
Ok for verbals 

## 2018-03-10 ENCOUNTER — Other Ambulatory Visit: Payer: Self-pay | Admitting: Internal Medicine

## 2018-03-10 DIAGNOSIS — J441 Chronic obstructive pulmonary disease with (acute) exacerbation: Secondary | ICD-10-CM | POA: Diagnosis not present

## 2018-03-10 DIAGNOSIS — I5030 Unspecified diastolic (congestive) heart failure: Secondary | ICD-10-CM | POA: Diagnosis not present

## 2018-03-10 DIAGNOSIS — F4312 Post-traumatic stress disorder, chronic: Secondary | ICD-10-CM

## 2018-03-10 DIAGNOSIS — E43 Unspecified severe protein-calorie malnutrition: Secondary | ICD-10-CM | POA: Diagnosis not present

## 2018-03-10 DIAGNOSIS — I110 Hypertensive heart disease with heart failure: Secondary | ICD-10-CM | POA: Diagnosis not present

## 2018-03-10 DIAGNOSIS — I2510 Atherosclerotic heart disease of native coronary artery without angina pectoris: Secondary | ICD-10-CM | POA: Diagnosis not present

## 2018-03-10 DIAGNOSIS — D649 Anemia, unspecified: Secondary | ICD-10-CM | POA: Diagnosis not present

## 2018-03-10 NOTE — Telephone Encounter (Signed)
Refill request for Xanax 0.25mg , last filled on 02/27/18 # 4 by another provider  LOV: 12/09/17 Dr. Jenny Reichmann  [facility] [address]

## 2018-03-10 NOTE — Telephone Encounter (Signed)
Notified Angel w/MD response.Marland KitchenJohny [name]

## 2018-03-10 NOTE — Telephone Encounter (Signed)
Copied from [place] [phone]. Topic: Quick Communication - Rx Refill/Question >> Mar 10, 2018  4:15 PM Marin Olp L wrote: Medication: ALPRAZolam Duanne Moron) 0.25 MG tablet (almost out of meds) Has the patient contacted their pharmacy? Yes.   (Agent: If no, request that the patient contact the pharmacy for the refill.) Preferred Pharmacy (with phone number or street name): [facility] - [address] [place] [address] Phone: [phone] Fax: [phone] Agent: Please be advised that RX refills may take up to 3 business days. We ask that you follow-up with your pharmacy.

## 2018-03-13 ENCOUNTER — Telehealth: Payer: Self-pay | Admitting: Pulmonary Disease

## 2018-03-13 MED ORDER — ALPRAZOLAM 0.25 MG PO TABS
0.2500 mg | ORAL_TABLET | Freq: Two times a day (BID) | ORAL | 2 refills | Status: DC | PRN
Start: 2018-03-13 — End: 2018-04-14

## 2018-03-13 NOTE — Telephone Encounter (Signed)
Done erx 

## 2018-03-13 NOTE — Telephone Encounter (Signed)
Called and spoke with Glenard Haring with [facility] regarding pt's referral request. Dr. Jeneen Rinks MD; pt's PCP has already verbalized request for pt today. Nothing further needed at this time.

## 2018-03-13 NOTE — Telephone Encounter (Signed)
02/07/2018 60# 

## 2018-03-16 DIAGNOSIS — I5030 Unspecified diastolic (congestive) heart failure: Secondary | ICD-10-CM | POA: Diagnosis not present

## 2018-03-16 DIAGNOSIS — I110 Hypertensive heart disease with heart failure: Secondary | ICD-10-CM | POA: Diagnosis not present

## 2018-03-16 DIAGNOSIS — I2510 Atherosclerotic heart disease of native coronary artery without angina pectoris: Secondary | ICD-10-CM | POA: Diagnosis not present

## 2018-03-16 DIAGNOSIS — D649 Anemia, unspecified: Secondary | ICD-10-CM | POA: Diagnosis not present

## 2018-03-16 DIAGNOSIS — E43 Unspecified severe protein-calorie malnutrition: Secondary | ICD-10-CM | POA: Diagnosis not present

## 2018-03-16 DIAGNOSIS — J441 Chronic obstructive pulmonary disease with (acute) exacerbation: Secondary | ICD-10-CM | POA: Diagnosis not present

## 2018-03-18 DIAGNOSIS — E43 Unspecified severe protein-calorie malnutrition: Secondary | ICD-10-CM | POA: Diagnosis not present

## 2018-03-18 DIAGNOSIS — J441 Chronic obstructive pulmonary disease with (acute) exacerbation: Secondary | ICD-10-CM | POA: Diagnosis not present

## 2018-03-18 DIAGNOSIS — I2510 Atherosclerotic heart disease of native coronary artery without angina pectoris: Secondary | ICD-10-CM | POA: Diagnosis not present

## 2018-03-18 DIAGNOSIS — I110 Hypertensive heart disease with heart failure: Secondary | ICD-10-CM | POA: Diagnosis not present

## 2018-03-18 DIAGNOSIS — I5030 Unspecified diastolic (congestive) heart failure: Secondary | ICD-10-CM | POA: Diagnosis not present

## 2018-03-18 DIAGNOSIS — D649 Anemia, unspecified: Secondary | ICD-10-CM | POA: Diagnosis not present

## 2018-03-22 DIAGNOSIS — D649 Anemia, unspecified: Secondary | ICD-10-CM | POA: Diagnosis not present

## 2018-03-22 DIAGNOSIS — E43 Unspecified severe protein-calorie malnutrition: Secondary | ICD-10-CM | POA: Diagnosis not present

## 2018-03-22 DIAGNOSIS — I110 Hypertensive heart disease with heart failure: Secondary | ICD-10-CM | POA: Diagnosis not present

## 2018-03-22 DIAGNOSIS — J441 Chronic obstructive pulmonary disease with (acute) exacerbation: Secondary | ICD-10-CM | POA: Diagnosis not present

## 2018-03-22 DIAGNOSIS — I5030 Unspecified diastolic (congestive) heart failure: Secondary | ICD-10-CM | POA: Diagnosis not present

## 2018-03-22 DIAGNOSIS — I2510 Atherosclerotic heart disease of native coronary artery without angina pectoris: Secondary | ICD-10-CM | POA: Diagnosis not present

## 2018-03-27 DIAGNOSIS — I5030 Unspecified diastolic (congestive) heart failure: Secondary | ICD-10-CM | POA: Diagnosis not present

## 2018-03-27 DIAGNOSIS — E43 Unspecified severe protein-calorie malnutrition: Secondary | ICD-10-CM | POA: Diagnosis not present

## 2018-03-27 DIAGNOSIS — I2510 Atherosclerotic heart disease of native coronary artery without angina pectoris: Secondary | ICD-10-CM | POA: Diagnosis not present

## 2018-03-27 DIAGNOSIS — J441 Chronic obstructive pulmonary disease with (acute) exacerbation: Secondary | ICD-10-CM | POA: Diagnosis not present

## 2018-03-27 DIAGNOSIS — D649 Anemia, unspecified: Secondary | ICD-10-CM | POA: Diagnosis not present

## 2018-03-27 DIAGNOSIS — I110 Hypertensive heart disease with heart failure: Secondary | ICD-10-CM | POA: Diagnosis not present

## 2018-03-31 ENCOUNTER — Encounter: Payer: Self-pay | Admitting: Emergency Medicine

## 2018-03-31 ENCOUNTER — Ambulatory Visit (INDEPENDENT_AMBULATORY_CARE_PROVIDER_SITE_OTHER): Payer: Medicare Other | Admitting: Emergency Medicine

## 2018-03-31 DIAGNOSIS — J9612 Chronic respiratory failure with hypercapnia: Secondary | ICD-10-CM | POA: Diagnosis not present

## 2018-03-31 DIAGNOSIS — J309 Allergic rhinitis, unspecified: Secondary | ICD-10-CM | POA: Insufficient documentation

## 2018-03-31 DIAGNOSIS — J301 Allergic rhinitis due to pollen: Secondary | ICD-10-CM | POA: Diagnosis not present

## 2018-03-31 DIAGNOSIS — J439 Emphysema, unspecified: Secondary | ICD-10-CM | POA: Diagnosis not present

## 2018-03-31 DIAGNOSIS — J9611 Chronic respiratory failure with hypoxia: Secondary | ICD-10-CM

## 2018-03-31 MED ORDER — ALBUTEROL SULFATE HFA 108 (90 BASE) MCG/ACT IN AERS: 2.0000 | INHALATION_SPRAY | Freq: Four times a day (QID) | RESPIRATORY_TRACT | 5 refills | Status: DC | PRN

## 2018-03-31 MED ORDER — FLUTICASONE-UMECLIDIN-VILANT 100-62.5-25 MCG/INH IN AEPB: 1.0000 | INHALATION_SPRAY | Freq: Every day | RESPIRATORY_TRACT | 5 refills | Status: DC

## 2018-03-31 NOTE — Addendum Note (Signed)
Addended by: Jannette Spanner on: 03/31/2018 01:03 PM   Modules accepted: Orders

## 2018-03-31 NOTE — Assessment & Plan Note (Addendum)
We will stop Advair and Incruse for now.  Start Trelegy one inhalation once a day. Rinse and gargle after using. If you benefit then we will order through your pharmacy.  We will stop levalbuterol  Use instead albuterol 2 puffs up to every 4 hours if needed for shortness of breath, cough, wheeze.  Depending on how you are doing when we follow up, we may decide to add low dose daily prednisone.  Follow with Dr Lamonte Sakai in 1 month or next available

## 2018-03-31 NOTE — Patient Instructions (Signed)
We will stop Advair and Incruse for now.  Start Trelegy one inhalation once a day. Rinse and gargle after using. If you benefit then we will order through your pharmacy.  We will stop levalbuterol  Use instead albuterol 2 puffs up to every 4 hours if needed for shortness of breath, cough, wheeze.  Please continue your trilogy ventilator every night.  Please continue your oxygen at 2-4l/min at all times. Our goal is to keep Spo2 90-93% Please continue your flonase and claritin as you are taking them  Depending on how you are doing when we follow up, we may decide to add low dose daily prednisone.  Follow with Dr Lamonte Sakai in 1 month or next available

## 2018-03-31 NOTE — Assessment & Plan Note (Signed)
Managed on Trilogy mechanical ventilator, good compliance.  She is using oxygen typically at 2 L/min.  She has been told that she can uptitrate.  I like for her to obtain an oximeter so she can keep her goal saturations between 90 and 93%.

## 2018-03-31 NOTE — Assessment & Plan Note (Signed)
Please continue your flonase and claritin as you are taking them

## 2018-03-31 NOTE — Progress Notes (Signed)
Subjective:    Patient ID: Megan [name], female    DOB: 1950-11-25, 67 y.o.   MRN: [id number]  HPI 67 year old former smoker with a history of Severe COPD, past hospitalization for acute on chronic respiratory failure 01/2017 requiring tracheostomy.  She also has hypertension with diastolic CHF, secondary pulmonary hypertension pulmonary nodular disease.  I have never actually seen her in the office although she has been seen by others here.  Most recently by Dr. Elsworth Soho in April.  She was then admitted 02/13/2018 with an exacerbation and acute on chronic respiratory failure.  She was treated for septic shock, required BiPAP.  Her RVP was positive for metapneumovirus.   She is currently on Advair, Incruse. She has xopenex HFA, uses about 4x a day. Her O2 is at 2-4L/min. She has a Trilogy vent that she uses at night through [facility]. She has a full face mask. She is off prednisone, it was tapered after the hospitalization. Her daily functional capacity is limited. She has daily cough, more for the last week. Thick clear mucous.   Review of Systems  Past Medical History:  Diagnosis Date  . Anemia   . Anxiety state 08/20/2015  . CAP (community acquired pneumonia)   . CHF (congestive heart failure) ([place])   . COPD (chronic obstructive pulmonary disease) ([place])   . Depression   . Essential hypertension 08/19/2015  . GERD (gastroesophageal reflux disease)   . Headache   . History of hiatal hernia   . Myocardial infarction ([place])   . Shortness of breath dyspnea      Family History  Problem Relation Age of Onset  . Cirrhosis Mother   . Depression Mother   . Heart disease Father      Social History   Socioeconomic History  . Marital status: Widowed    Spouse name: Not on file  . Number of children: 3  . Years of education: 53  . Highest education level: Not on file  Occupational History  . Occupation: Disability  Social Needs  . Financial resource strain: Not on file  . Food  insecurity:    Worry: Not on file    Inability: Not on file  . Transportation needs:    Medical: Not on file    Non-medical: Not on file  Tobacco Use  . Smoking status: Former Smoker    Packs/day: 1.00    Years: 30.00    Pack years: 30.00    Types: Cigarettes    Last attempt to quit: 08/18/2000    Years since quitting: 17.6  . Smokeless tobacco: Never Used  Substance and Sexual Activity  . Alcohol use: No  . Drug use: No  . Sexual activity: Never  Lifestyle  . Physical activity:    Days per week: Not on file    Minutes per session: Not on file  . Stress: Not on file  Relationships  . Social connections:    Talks on phone: Not on file    Gets together: Not on file    Attends religious service: Not on file    Active member of club or organization: Not on file    Attends meetings of clubs or organizations: Not on file    Relationship status: Not on file  . Intimate partner violence:    Fear of current or ex partner: Not on file    Emotionally abused: Not on file    Physically abused: Not on file    Forced sexual activity: Not on  file  Other Topics Concern  . Not on file  Social History Narrative   Denies abuse and feels safe at home.   has worked in [employer] before, also Educational psychologist, Chief Operating Officer.  [place], [place], [place]   Allergies  Allergen Reactions  . Penicillins Rash and Hives    Has patient had a PCN reaction causing immediate rash, facial/tongue/throat swelling, SOB or lightheadedness with hypotension: No Has patient had a PCN reaction causing severe rash involving mucus membranes or skin necrosis:NO Has patient had a PCN reaction that required hospitalization No Has patient had a PCN reaction occurring within the last 10 years:NO If all of the above answers are "NO", then may proceed with Cephalosporin use.  . Fentanyl And Related Other (See Comments)    Behavioral changes per daughter     Outpatient Medications Prior to Visit  Medication Sig Dispense Refill  . ALPRAZolam  (XANAX) 0.25 MG tablet Take 1 tablet (0.25 mg total) by mouth 2 (two) times daily as needed. for anxiety 60 tablet 2  . aspirin EC 81 MG tablet Take 81 mg by mouth daily.    . butalbital-acetaminophen-caffeine (FIORICET, ESGIC) 50-325-40 MG tablet Take 1 tablet by mouth 2 (two) times daily as needed for headache. 30 tablet 0  . diltiazem (CARDIZEM CD) 240 MG 24 hr capsule Take 1 capsule (240 mg total) by mouth daily. 30 capsule 3  . feeding supplement, ENSURE ENLIVE, (ENSURE ENLIVE) LIQD Take 237 mLs by mouth 4 (four) times daily. 237 mL 0  . fluticasone (FLONASE) 50 MCG/ACT nasal spray Place 2 sprays into both nostrils daily. 16 g 6  . Fluticasone-Salmeterol (ADVAIR DISKUS) 500-50 MCG/DOSE AEPB Inhale 1 puff into the lungs 2 (two) times daily. 60 each 3  . levalbuterol (XOPENEX HFA) 45 MCG/ACT inhaler INHALE 1 TO 2 PUFFS INTO THE LUNGS EVERY 4 HOURS AS NEEDED FOR WHEEZING 75 g 2  . levalbuterol (XOPENEX) 1.25 MG/0.5ML nebulizer solution Take 1.25 mg by nebulization every 6 (six) hours. 30 each 12  . loratadine (CLARITIN) 10 MG tablet Take 1 tablet (10 mg total) daily by mouth. 90 tablet 3  . metoprolol tartrate (LOPRESSOR) 25 MG tablet Take 0.5 tablets (12.5 mg total) by mouth 2 (two) times daily. 30 tablet 1  . pantoprazole (PROTONIX) 40 MG tablet Take 1 tablet (40 mg total) by mouth daily. 30 tablet 1  . polyethylene glycol (MIRALAX / GLYCOLAX) packet Take 17 g by mouth 2 (two) times daily. 14 each 0  . predniSONE (DELTASONE) 20 MG tablet Take 1 tablet (20 mg total) by mouth daily with breakfast. 5 tablet 0  . senna-docusate (SENOKOT-S) 8.6-50 MG tablet Take 1 tablet by mouth 2 (two) times daily. 120 tablet 1  . sertraline (ZOLOFT) 50 MG tablet Take 1 tablet (50 mg total) by mouth daily. 30 tablet 2  . tiZANidine (ZANAFLEX) 4 MG tablet TAKE 1 TABLET(4 MG) BY MOUTH EVERY 6 HOURS AS NEEDED FOR MUSCLE SPASMS 360 tablet 1  . umeclidinium bromide (INCRUSE ELLIPTA) 62.5 MCG/INH AEPB Inhale 1 puff into  the lungs daily. 30 each 1  . budesonide (PULMICORT) 0.5 MG/2ML nebulizer solution Take 2 mLs (0.5 mg total) by nebulization 2 (two) times daily. 120 mL 0   No facility-administered medications prior to visit.          Objective:   Physical Exam Vitals:   03/31/18 1154  BP: 100/70  Pulse: 77  SpO2: 91%  Weight: 89 lb (40.4 kg)  Height: 5\' 3"  (1.6 m)  Gen: Pleasant, very thin, in no distress on 2L/min,  normal affect  ENT: No lesions,  mouth clear,  oropharynx clear, upper denture, no postnasal drip  Neck: No JVD, no stridor  Lungs: No use of accessory muscles, very distant, no wheeze on normal breath, she does wheeze on a forced exp  Cardiovascular: RRR, heart sounds normal, no murmur or gallops, no peripheral edema  Musculoskeletal: No deformities, no cyanosis or clubbing  Neuro: alert, non focal  Skin: Warm, no lesions or rash      Assessment & Plan:  Chronic respiratory failure with hypoxia and hypercapnia (HCC) Managed on Trilogy mechanical ventilator, good compliance.  She is using oxygen typically at 2 L/min.  She has been told that she can uptitrate.  I like for her to obtain an oximeter so she can keep her goal saturations between 90 and 93%.  COPD (chronic obstructive pulmonary disease) (HCC) We will stop Advair and Incruse for now.  Start Trelegy one inhalation once a day. Rinse and gargle after using. If you benefit then we will order through your pharmacy.  We will stop levalbuterol  Use instead albuterol 2 puffs up to every 4 hours if needed for shortness of breath, cough, wheeze.  Depending on how you are doing when we follow up, we may decide to add low dose daily prednisone.  Follow with Dr Lamonte Sakai in 1 month or next available  Allergic rhinitis Please continue your flonase and claritin as you are taking them   [name], MD, PhD 03/31/2018, 12:31 PM [facility] [phone] or if no answer [phone]

## 2018-04-07 DIAGNOSIS — E43 Unspecified severe protein-calorie malnutrition: Secondary | ICD-10-CM | POA: Diagnosis not present

## 2018-04-07 DIAGNOSIS — D649 Anemia, unspecified: Secondary | ICD-10-CM | POA: Diagnosis not present

## 2018-04-07 DIAGNOSIS — J441 Chronic obstructive pulmonary disease with (acute) exacerbation: Secondary | ICD-10-CM | POA: Diagnosis not present

## 2018-04-07 DIAGNOSIS — I110 Hypertensive heart disease with heart failure: Secondary | ICD-10-CM | POA: Diagnosis not present

## 2018-04-07 DIAGNOSIS — I2510 Atherosclerotic heart disease of native coronary artery without angina pectoris: Secondary | ICD-10-CM | POA: Diagnosis not present

## 2018-04-07 DIAGNOSIS — I5030 Unspecified diastolic (congestive) heart failure: Secondary | ICD-10-CM | POA: Diagnosis not present

## 2018-04-12 DIAGNOSIS — E43 Unspecified severe protein-calorie malnutrition: Secondary | ICD-10-CM | POA: Diagnosis not present

## 2018-04-12 DIAGNOSIS — I5030 Unspecified diastolic (congestive) heart failure: Secondary | ICD-10-CM | POA: Diagnosis not present

## 2018-04-12 DIAGNOSIS — I2510 Atherosclerotic heart disease of native coronary artery without angina pectoris: Secondary | ICD-10-CM | POA: Diagnosis not present

## 2018-04-12 DIAGNOSIS — J441 Chronic obstructive pulmonary disease with (acute) exacerbation: Secondary | ICD-10-CM | POA: Diagnosis not present

## 2018-04-12 DIAGNOSIS — D649 Anemia, unspecified: Secondary | ICD-10-CM | POA: Diagnosis not present

## 2018-04-12 DIAGNOSIS — I110 Hypertensive heart disease with heart failure: Secondary | ICD-10-CM | POA: Diagnosis not present

## 2018-04-14 ENCOUNTER — Other Ambulatory Visit: Payer: Self-pay | Admitting: Internal Medicine

## 2018-04-14 DIAGNOSIS — F4312 Post-traumatic stress disorder, chronic: Secondary | ICD-10-CM

## 2018-04-16 ENCOUNTER — Other Ambulatory Visit: Payer: Self-pay

## 2018-04-16 ENCOUNTER — Emergency Department (HOSPITAL_COMMUNITY): Payer: Medicare Other

## 2018-04-16 ENCOUNTER — Encounter (HOSPITAL_COMMUNITY): Payer: Self-pay

## 2018-04-16 ENCOUNTER — Inpatient Hospital Stay (HOSPITAL_COMMUNITY)
Admission: EM | Admit: 2018-04-16 | Discharge: 2018-04-21 | DRG: 393 | Disposition: A | Discharge status: Home Health | Payer: Medicare Other | Attending: Internal Medicine | Admitting: Internal Medicine

## 2018-04-16 DIAGNOSIS — Q396 Congenital diverticulum of esophagus: Secondary | ICD-10-CM | POA: Diagnosis not present

## 2018-04-16 DIAGNOSIS — J189 Pneumonia, unspecified organism: Secondary | ICD-10-CM | POA: Diagnosis not present

## 2018-04-16 DIAGNOSIS — Z79899 Other long term (current) drug therapy: Secondary | ICD-10-CM

## 2018-04-16 DIAGNOSIS — R402413 Glasgow coma scale score 13-15, at hospital admission: Secondary | ICD-10-CM | POA: Diagnosis present

## 2018-04-16 DIAGNOSIS — Z9981 Dependence on supplemental oxygen: Secondary | ICD-10-CM | POA: Diagnosis not present

## 2018-04-16 DIAGNOSIS — R1012 Left upper quadrant pain: Secondary | ICD-10-CM | POA: Diagnosis not present

## 2018-04-16 DIAGNOSIS — R000 Tachycardia, unspecified: Secondary | ICD-10-CM | POA: Diagnosis not present

## 2018-04-16 DIAGNOSIS — I119 Hypertensive heart disease without heart failure: Secondary | ICD-10-CM | POA: Diagnosis present

## 2018-04-16 DIAGNOSIS — K449 Diaphragmatic hernia without obstruction or gangrene: Secondary | ICD-10-CM | POA: Diagnosis present

## 2018-04-16 DIAGNOSIS — R1110 Vomiting, unspecified: Secondary | ICD-10-CM | POA: Diagnosis not present

## 2018-04-16 DIAGNOSIS — R0789 Other chest pain: Secondary | ICD-10-CM | POA: Diagnosis not present

## 2018-04-16 DIAGNOSIS — R1310 Dysphagia, unspecified: Secondary | ICD-10-CM | POA: Diagnosis not present

## 2018-04-16 DIAGNOSIS — Z7982 Long term (current) use of aspirin: Secondary | ICD-10-CM

## 2018-04-16 DIAGNOSIS — G4730 Sleep apnea, unspecified: Secondary | ICD-10-CM | POA: Diagnosis present

## 2018-04-16 DIAGNOSIS — Z885 Allergy status to narcotic agent status: Secondary | ICD-10-CM

## 2018-04-16 DIAGNOSIS — R079 Chest pain, unspecified: Secondary | ICD-10-CM | POA: Diagnosis present

## 2018-04-16 DIAGNOSIS — I252 Old myocardial infarction: Secondary | ICD-10-CM

## 2018-04-16 DIAGNOSIS — F329 Major depressive disorder, single episode, unspecified: Secondary | ICD-10-CM | POA: Diagnosis present

## 2018-04-16 DIAGNOSIS — T18128A Food in esophagus causing other injury, initial encounter: Secondary | ICD-10-CM | POA: Diagnosis not present

## 2018-04-16 DIAGNOSIS — R52 Pain, unspecified: Secondary | ICD-10-CM

## 2018-04-16 DIAGNOSIS — R1013 Epigastric pain: Secondary | ICD-10-CM

## 2018-04-16 DIAGNOSIS — R933 Abnormal findings on diagnostic imaging of other parts of digestive tract: Secondary | ICD-10-CM | POA: Diagnosis present

## 2018-04-16 DIAGNOSIS — J9611 Chronic respiratory failure with hypoxia: Secondary | ICD-10-CM | POA: Diagnosis not present

## 2018-04-16 DIAGNOSIS — K224 Dyskinesia of esophagus: Secondary | ICD-10-CM | POA: Diagnosis present

## 2018-04-16 DIAGNOSIS — I4519 Other right bundle-branch block: Secondary | ICD-10-CM | POA: Diagnosis present

## 2018-04-16 DIAGNOSIS — M797 Fibromyalgia: Secondary | ICD-10-CM | POA: Diagnosis present

## 2018-04-16 DIAGNOSIS — Z7952 Long term (current) use of systemic steroids: Secondary | ICD-10-CM

## 2018-04-16 DIAGNOSIS — X58XXXA Exposure to other specified factors, initial encounter: Secondary | ICD-10-CM | POA: Diagnosis present

## 2018-04-16 DIAGNOSIS — R509 Fever, unspecified: Secondary | ICD-10-CM

## 2018-04-16 DIAGNOSIS — K219 Gastro-esophageal reflux disease without esophagitis: Secondary | ICD-10-CM | POA: Diagnosis present

## 2018-04-16 DIAGNOSIS — J439 Emphysema, unspecified: Secondary | ICD-10-CM | POA: Diagnosis not present

## 2018-04-16 DIAGNOSIS — J449 Chronic obstructive pulmonary disease, unspecified: Secondary | ICD-10-CM | POA: Diagnosis present

## 2018-04-16 DIAGNOSIS — Z87891 Personal history of nicotine dependence: Secondary | ICD-10-CM

## 2018-04-16 DIAGNOSIS — I10 Essential (primary) hypertension: Secondary | ICD-10-CM | POA: Diagnosis present

## 2018-04-16 DIAGNOSIS — F419 Anxiety disorder, unspecified: Secondary | ICD-10-CM | POA: Diagnosis present

## 2018-04-16 DIAGNOSIS — Z7951 Long term (current) use of inhaled steroids: Secondary | ICD-10-CM

## 2018-04-16 DIAGNOSIS — J440 Chronic obstructive pulmonary disease with acute lower respiratory infection: Secondary | ICD-10-CM | POA: Diagnosis not present

## 2018-04-16 DIAGNOSIS — R109 Unspecified abdominal pain: Secondary | ICD-10-CM | POA: Diagnosis present

## 2018-04-16 DIAGNOSIS — K228 Other specified diseases of esophagus: Secondary | ICD-10-CM | POA: Diagnosis present

## 2018-04-16 DIAGNOSIS — D649 Anemia, unspecified: Secondary | ICD-10-CM | POA: Diagnosis present

## 2018-04-16 DIAGNOSIS — R51 Headache: Secondary | ICD-10-CM | POA: Diagnosis present

## 2018-04-16 LAB — CBC
HEMATOCRIT: 36.8 % (ref 36.0–46.0)
HEMOGLOBIN: 11.2 g/dL — AB (ref 12.0–15.0)
MCH: 29.2 pg (ref 26.0–34.0)
MCHC: 30.4 g/dL (ref 30.0–36.0)
MCV: 96.1 fL (ref 78.0–100.0)
Platelets: 379 10*3/uL (ref 150–400)
RBC: 3.83 MIL/uL — AB (ref 3.87–5.11)
RDW: 13.3 % (ref 11.5–15.5)
WBC: 7.2 10*3/uL (ref 4.0–10.5)

## 2018-04-16 LAB — HEPATIC FUNCTION PANEL
ALBUMIN: 3.8 g/dL (ref 3.5–5.0)
ALT: 20 U/L (ref 14–54)
AST: 30 U/L (ref 15–41)
Alkaline Phosphatase: 67 U/L (ref 38–126)
Bilirubin, Direct: 0.3 mg/dL (ref 0.1–0.5)
Indirect Bilirubin: 0.3 mg/dL (ref 0.3–0.9)
TOTAL PROTEIN: 7.2 g/dL (ref 6.5–8.1)
Total Bilirubin: 0.6 mg/dL (ref 0.3–1.2)

## 2018-04-16 LAB — URINALYSIS, ROUTINE W REFLEX MICROSCOPIC
BILIRUBIN URINE: NEGATIVE
Glucose, UA: NEGATIVE mg/dL
Hgb urine dipstick: NEGATIVE
Ketones, ur: NEGATIVE mg/dL
Leukocytes, UA: NEGATIVE
NITRITE: NEGATIVE
Protein, ur: NEGATIVE mg/dL
SPECIFIC GRAVITY, URINE: 1.008 (ref 1.005–1.030)
pH: 9 — ABNORMAL HIGH (ref 5.0–8.0)

## 2018-04-16 LAB — BASIC METABOLIC PANEL
ANION GAP: 10 (ref 5–15)
BUN: 9 mg/dL (ref 6–20)
CALCIUM: 9.4 mg/dL (ref 8.9–10.3)
CHLORIDE: 93 mmol/L — AB (ref 101–111)
CO2: 35 mmol/L — AB (ref 22–32)
Creatinine, Ser: 0.68 mg/dL (ref 0.44–1.00)
GFR calc non Af Amer: >60 mL/min (ref >60)
Glucose, Bld: 135 mg/dL — ABNORMAL HIGH (ref 65–99)
POTASSIUM: 4.8 mmol/L (ref 3.5–5.1)
Sodium: 138 mmol/L (ref 135–145)

## 2018-04-16 LAB — I-STAT TROPONIN, ED: TROPONIN I, POC: 0 ng/mL (ref 0.00–0.08)

## 2018-04-16 LAB — LIPASE, BLOOD: Lipase: 25 U/L (ref 11–51)

## 2018-04-16 LAB — I-STAT CG4 LACTIC ACID, ED
LACTIC ACID, VENOUS: 1.41 mmol/L (ref 0.5–1.9)
LACTIC ACID, VENOUS: 0.75 mmol/L (ref 0.5–1.9)

## 2018-04-16 LAB — I-STAT BETA HCG BLOOD, ED (MC, WL, AP ONLY)

## 2018-04-16 LAB — TROPONIN I: Troponin I: <0.03 ng/mL (ref <0.03)

## 2018-04-16 MED ORDER — SODIUM CHLORIDE 0.9 % IV BOLUS
1000.0000 mL | Freq: Once | INTRAVENOUS | Status: AC
  Administered 2018-04-16: 1000 mL via INTRAVENOUS

## 2018-04-16 MED ORDER — ACETAMINOPHEN 650 MG RE SUPP: 650.0000 mg | Freq: Four times a day (QID) | RECTAL | Status: DC | PRN

## 2018-04-16 MED ORDER — ONDANSETRON HCL 4 MG PO TABS: 4.0000 mg | ORAL_TABLET | Freq: Four times a day (QID) | ORAL | Status: DC | PRN

## 2018-04-16 MED ORDER — FLUTICASONE-UMECLIDIN-VILANT 100-62.5-25 MCG/INH IN AEPB: 1.0000 | INHALATION_SPRAY | Freq: Every day | RESPIRATORY_TRACT | Status: DC

## 2018-04-16 MED ORDER — NITROGLYCERIN 0.4 MG SL SUBL
0.4000 mg | SUBLINGUAL_TABLET | SUBLINGUAL | Status: DC | PRN
  Filled 2018-04-16: qty 1

## 2018-04-16 MED ORDER — METOPROLOL TARTRATE 5 MG/5ML IV SOLN: 5.0000 mg | Freq: Four times a day (QID) | INTRAVENOUS | Status: DC

## 2018-04-16 MED ORDER — MORPHINE SULFATE (PF) 4 MG/ML IV SOLN
2.0000 mg | INTRAVENOUS | Status: DC | PRN
  Administered 2018-04-16: 4 mg via INTRAVENOUS
  Administered 2018-04-18: 2 mg via INTRAVENOUS
  Administered 2018-04-18: 4 mg via INTRAVENOUS
  Filled 2018-04-16 (×3): qty 1

## 2018-04-16 MED ORDER — ASPIRIN 300 MG RE SUPP
300.0000 mg | Freq: Every day | RECTAL | Status: DC
  Administered 2018-04-17: 300 mg via RECTAL
  Filled 2018-04-16 (×2): qty 1

## 2018-04-16 MED ORDER — NITROGLYCERIN 0.4 MG SL SUBL
0.4000 mg | SUBLINGUAL_TABLET | SUBLINGUAL | Status: DC | PRN
  Administered 2018-04-16 (×2): 0.4 mg via SUBLINGUAL

## 2018-04-16 MED ORDER — FLUTICASONE FUROATE-VILANTEROL 100-25 MCG/INH IN AEPB
1.0000 | INHALATION_SPRAY | Freq: Every day | RESPIRATORY_TRACT | Status: DC
  Administered 2018-04-18 – 2018-04-21 (×4): 1 via RESPIRATORY_TRACT
  Filled 2018-04-16: qty 28

## 2018-04-16 MED ORDER — NITROGLYCERIN 0.4 MG SL SUBL: 0.4000 mg | SUBLINGUAL_TABLET | SUBLINGUAL | Status: DC | PRN

## 2018-04-16 MED ORDER — SODIUM CHLORIDE 0.9% FLUSH
3.0000 mL | Freq: Two times a day (BID) | INTRAVENOUS | Status: DC
  Administered 2018-04-16 – 2018-04-21 (×8): 3 mL via INTRAVENOUS

## 2018-04-16 MED ORDER — LEVALBUTEROL HCL 1.25 MG/0.5ML IN NEBU
1.2500 mg | INHALATION_SOLUTION | Freq: Four times a day (QID) | RESPIRATORY_TRACT | Status: DC
  Filled 2018-04-16: qty 0.5

## 2018-04-16 MED ORDER — LACTATED RINGERS IV SOLN
INTRAVENOUS | Status: DC
  Administered 2018-04-16 – 2018-04-18 (×2): via INTRAVENOUS

## 2018-04-16 MED ORDER — LORAZEPAM 2 MG/ML IJ SOLN
0.5000 mg | Freq: Three times a day (TID) | INTRAMUSCULAR | Status: DC | PRN
  Administered 2018-04-17 – 2018-04-18 (×2): 0.5 mg via INTRAVENOUS
  Filled 2018-04-16 (×2): qty 1

## 2018-04-16 MED ORDER — FAMOTIDINE IN NACL 20-0.9 MG/50ML-% IV SOLN
20.0000 mg | INTRAVENOUS | Status: DC
  Administered 2018-04-16 – 2018-04-17 (×2): 20 mg via INTRAVENOUS
  Filled 2018-04-16 (×2): qty 50

## 2018-04-16 MED ORDER — LEVALBUTEROL HCL 1.25 MG/0.5ML IN NEBU: 1.2500 mg | INHALATION_SOLUTION | Freq: Four times a day (QID) | RESPIRATORY_TRACT | Status: DC

## 2018-04-16 MED ORDER — GI COCKTAIL ~~LOC~~
30.0000 mL | Freq: Once | ORAL | Status: DC
  Filled 2018-04-16: qty 30

## 2018-04-16 MED ORDER — ONDANSETRON HCL 4 MG/2ML IJ SOLN
4.0000 mg | Freq: Four times a day (QID) | INTRAMUSCULAR | Status: DC | PRN
  Administered 2018-04-16: 4 mg via INTRAVENOUS
  Filled 2018-04-16: qty 2

## 2018-04-16 MED ORDER — ACETAMINOPHEN 325 MG PO TABS
650.0000 mg | ORAL_TABLET | Freq: Four times a day (QID) | ORAL | Status: DC | PRN
  Filled 2018-04-16 (×2): qty 2

## 2018-04-16 MED ORDER — PROMETHAZINE HCL 25 MG/ML IJ SOLN
12.5000 mg | Freq: Four times a day (QID) | INTRAMUSCULAR | Status: DC | PRN
  Administered 2018-04-16: 12.5 mg via INTRAVENOUS
  Filled 2018-04-16: qty 1

## 2018-04-16 MED ORDER — METOPROLOL TARTRATE 5 MG/5ML IV SOLN
5.0000 mg | Freq: Four times a day (QID) | INTRAVENOUS | Status: DC
  Administered 2018-04-16 – 2018-04-18 (×9): 5 mg via INTRAVENOUS
  Filled 2018-04-16 (×7): qty 5

## 2018-04-16 MED ORDER — UMECLIDINIUM BROMIDE 62.5 MCG/INH IN AEPB
1.0000 | INHALATION_SPRAY | Freq: Every day | RESPIRATORY_TRACT | Status: DC
Start: 2018-04-16 — End: 2018-04-22
  Administered 2018-04-18 – 2018-04-21 (×4): 1 via RESPIRATORY_TRACT
  Filled 2018-04-16: qty 7

## 2018-04-16 MED ORDER — MORPHINE SULFATE (PF) 4 MG/ML IV SOLN
4.0000 mg | Freq: Once | INTRAVENOUS | Status: AC
  Administered 2018-04-16: 4 mg via INTRAVENOUS
  Filled 2018-04-16: qty 1

## 2018-04-16 NOTE — ED Notes (Signed)
Patient transported to X-ray 

## 2018-04-16 NOTE — ED Provider Notes (Signed)
[facility] EMERGENCY DEPARTMENT Provider Note   CSN: [id number] Arrival date & time: 04/16/18  1422     History   Chief Complaint Chief Complaint  Patient presents with  . Abdominal Pain  . Chest Pain  . Emesis    HPI Megan [name] is a 67 y.o. female.  HPI  Patient is a 17 female with a history of COPD, oxygen dependence, heart failure with preserved ejection fraction, anxiety presenting for epigastric pain, chest pain patient reports that she has a long history of requiring esophageal dilatation due to esophageal spasm and narrowing, however it is worsened over the past 2 days.  Patient reports that over the past 2 days, she is regurgitating every solid food she eats, usually half an hour after eating, and to be down minimal liquid items.  Patient denies any shortness of breath, palpitations, or pleuritic CP.  Patient reports that today, she forcefully swallowed a large amount of water in order to try to force it down, however she felt a "ripping" sensation to the left side of her abdomen.  This is since resolved, but she still feels epigastric pain.  Patient is not regularly followed by gastroenterology.  Past Medical History:  Diagnosis Date  . Anemia   . Anxiety state 08/20/2015  . CAP (community acquired pneumonia)   . CHF (congestive heart failure) ([place])   . COPD (chronic obstructive pulmonary disease) ([place])   . Depression   . Essential hypertension 08/19/2015  . GERD (gastroesophageal reflux disease)   . Headache   . History of hiatal hernia   . Myocardial infarction ([place])   . Shortness of breath dyspnea     Patient Active Problem List   Diagnosis Date Noted  . Allergic rhinitis 03/31/2018  . Malnutrition of moderate degree 02/27/2018  . 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 Palliative care by specialist   . Acute on Chronic respiratory failure with hypoxia and SEVERE Hypercapnia ([place]) 02/13/2018  .  Acute pain of right shoulder 01/07/2018  . Flu-like symptoms 12/09/2017  . Fibromyalgia 09/16/2017  . Fatigue 09/16/2017  . Chronic respiratory failure with hypoxia and hypercapnia (HCC)   . Sepsis ([place])   . Hyperglycemia 01/05/2017  . Hair loss 06/22/2016  . Rash 06/22/2016  . Mixed headache 03/19/2016  . COPD (chronic obstructive pulmonary disease) ([place]) 03/19/2016  . Esophageal reflux 09/01/2015  . Goals of care, counseling/discussion   . Palliative care encounter   . Anxiety 08/20/2015  . Chronic post-traumatic stress disorder (PTSD) 08/20/2015  . Multiple pulmonary nodules determined by computed tomography of lung 08/19/2015  . Essential hypertension 08/19/2015  . Protein-calorie malnutrition, severe 08/19/2015    Past Surgical History:  Procedure Laterality Date  . CARDIAC CATHETERIZATION    . CARDIAC CATHETERIZATION N/A 08/26/2015   Procedure: Left Heart Cath and Coronary Angiography;  Surgeon: Jettie Booze, MD;  Location: [facility];  Service: Cardiovascular;  Laterality: N/A;     OB History   None      Home Medications    Prior to Admission medications   Medication Sig Start Date End Date Taking? Authorizing Provider  albuterol (PROVENTIL HFA;VENTOLIN HFA) 108 (90 Base) MCG/ACT inhaler Inhale 2 puffs into the lungs every 6 (six) hours as needed for wheezing or shortness of breath. Insurance preference 03/31/18   Collene Gobble, MD  ALPRAZolam Duanne Moron) 0.25 MG tablet Take 1 tablet (0.25 mg total) by mouth 2 (two) times daily as needed.  for anxiety 03/13/18   [name], MD  aspirin EC 81 MG tablet Take 81 mg by mouth daily.    [provider]  budesonide (PULMICORT) 0.5 MG/2ML nebulizer solution Take 2 mLs (0.5 mg total) by nebulization 2 (two) times daily. 03/08/17 04/07/17  Damita Lack, MD  butalbital-acetaminophen-caffeine (FIORICET, ESGIC) [id number] MG tablet Take 1 tablet by mouth 2 (two) times daily as needed for headache. 02/10/18    [name], MD  diltiazem (CARDIZEM CD) 240 MG 24 hr capsule Take 1 capsule (240 mg total) by mouth daily. 02/27/18   Roxan Hockey, MD  feeding supplement, ENSURE ENLIVE, (ENSURE ENLIVE) LIQD Take 237 mLs by mouth 4 (four) times daily. 01/11/17   Eugenie Filler, MD  fluticasone (FLONASE) 50 MCG/ACT nasal spray Place 2 sprays into both nostrils daily. 01/04/18   Marrian Salvage, FNP  Fluticasone-Salmeterol (ADVAIR DISKUS) 500-50 MCG/DOSE AEPB Inhale 1 puff into the lungs 2 (two) times daily. 02/27/18   Roxan Hockey, MD  Fluticasone-Umeclidin-Vilant (TRELEGY ELLIPTA) 100-62.5-25 MCG/INH AEPB Inhale 1 puff into the lungs daily. 03/31/18   Collene Gobble, MD  levalbuterol (XOPENEX HFA) 45 MCG/ACT inhaler INHALE 1 TO 2 PUFFS INTO THE LUNGS EVERY 4 HOURS AS NEEDED FOR WHEEZING 08/01/17   Golden Circle, FNP  levalbuterol (XOPENEX) 1.25 MG/0.5ML nebulizer solution Take 1.25 mg by nebulization every 6 (six) hours. 02/27/18   Roxan Hockey, MD  loratadine (CLARITIN) 10 MG tablet Take 1 tablet (10 mg total) daily by mouth. 09/16/17   [name], MD  metoprolol tartrate (LOPRESSOR) 25 MG tablet Take 0.5 tablets (12.5 mg total) by mouth 2 (two) times daily. 02/27/18   Roxan Hockey, MD  pantoprazole (PROTONIX) 40 MG tablet Take 1 tablet (40 mg total) by mouth daily. 02/28/18   Roxan Hockey, MD  polyethylene glycol (MIRALAX / GLYCOLAX) packet Take 17 g by mouth 2 (two) times daily. 01/11/17   Eugenie Filler, MD  predniSONE (DELTASONE) 20 MG tablet Take 1 tablet (20 mg total) by mouth daily with breakfast. 02/27/18   Emokpae, Courage, MD  senna-docusate (SENOKOT-S) 8.6-50 MG tablet Take 1 tablet by mouth 2 (two) times daily. 02/27/18   Roxan Hockey, MD  sertraline (ZOLOFT) 50 MG tablet Take 1 tablet (50 mg total) by mouth daily. 02/27/18   Roxan Hockey, MD  tiZANidine (ZANAFLEX) 4 MG tablet TAKE 1 TABLET(4 MG) BY MOUTH EVERY 6 HOURS AS NEEDED FOR MUSCLE SPASMS 09/16/17   [name], MD  umeclidinium bromide (INCRUSE ELLIPTA) 62.5 MCG/INH AEPB Inhale 1 puff into the lungs daily. 02/27/18   Roxan Hockey, MD    Family History Family History  Problem Relation Age of Onset  . Cirrhosis Mother   . Depression Mother   . Heart disease Father     Social History Social History   Tobacco Use  . Smoking status: Former Smoker    Packs/day: 1.00    Years: 30.00    Pack years: 30.00    Types: Cigarettes    Last attempt to quit: 08/18/2000    Years since quitting: 17.6  . Smokeless tobacco: Never Used  Substance Use Topics  . Alcohol use: No  . Drug use: No     Allergies   Penicillins and Fentanyl and related   Review of Systems Review of Systems  Constitutional: Negative for chills and fever.  Respiratory: Positive for chest tightness. Negative for shortness of breath.   Cardiovascular: Positive for chest pain. Negative for palpitations  and leg swelling.  Gastrointestinal: Positive for abdominal pain, nausea and vomiting. Negative for constipation and diarrhea.  Genitourinary: Negative for dysuria.  Musculoskeletal: Negative for back pain.  Skin: Negative for color change.  All other systems reviewed and are negative.    Physical Exam Updated Vital Signs BP (!) 158/79   Pulse 90   Temp 98.4 F (36.9 C) (Oral)   Resp 15   Ht 5\' 3"  (1.6 m)   Wt 40.8 kg (90 lb)   SpO2 99%   BMI 15.94 kg/m   Physical Exam  Constitutional:  Thin appearing and poorly nourished female.  HENT:  Head: Normocephalic and atraumatic.  Mouth/Throat: Oropharynx is clear and moist.  Eyes: Pupils are equal, round, and reactive to light. Conjunctivae and EOM are normal.  Neck: Normal range of motion. Neck supple.  Cardiovascular: Normal rate, regular rhythm, S1 normal and S2 normal.  No murmur heard. Pulmonary/Chest: Effort normal.  Diminished lung sounds in bilateral lung bases.  Abdominal: Soft. She exhibits no distension. There is tenderness. There is no  guarding.  Tenderness palpation of the epigastrium and left upper quadrant.  Musculoskeletal: Normal range of motion. She exhibits no edema or deformity.  Lymphadenopathy:    She has no cervical adenopathy.  Neurological: She is alert.  Cranial nerves grossly intact. Patient moves extremities symmetrically and with good coordination.  Skin: Skin is warm and dry. No rash noted. No erythema.  Psychiatric: She has a normal mood and affect. Her behavior is normal. Judgment and thought content normal.  Nursing note and vitals reviewed.    ED Treatments / Results  Labs (all labs ordered are listed, but only abnormal results are displayed) Labs Reviewed  CBC - Abnormal; Notable for the following components:      Result Value   RBC 3.83 (*)    Hemoglobin 11.2 (*)    All other components within normal limits  BASIC METABOLIC PANEL  LIPASE, BLOOD  URINALYSIS, ROUTINE W REFLEX MICROSCOPIC  I-STAT TROPONIN, ED  I-STAT CG4 LACTIC ACID, ED    EKG EKG Interpretation  Date/Time:  Sunday April 16 2018 14:30:05 EDT Ventricular Rate:  89 PR Interval:    QRS Duration: 84 QT Interval:  367 QTC Calculation: 447 R Axis:   61 Text Interpretation:  Sinus rhythm Biatrial enlargement Anterior infarct, possibly acute No significant change since last tracing Confirmed by Blanchie Dessert [phone]) on 04/16/2018 3:18:23 PM   Radiology Dg Chest 2 View  Result Date: 04/16/2018 CLINICAL DATA:  Upper abdominal and lower chest pain radiating to the back with vomiting. EXAM: CHEST - 2 VIEW COMPARISON:  CT 02/23/2018 and CXR 02/20/2018 FINDINGS: The heart size and mediastinal contours are within normal limits. Nonaneurysmal atherosclerotic aorta. Emphysematous hyperinflation of the lungs with clearing of posterior pleural effusions since prior exam. Nodular densities compatible with nipple shadows are seen bilaterally overlying the mid lung, best seen on the frontal view. These can be confirmed with nipple  markers and repeat imaging. Superimposed small moderate-sized hiatal hernia. Osteopenic appearance of the bony thorax without acute nor suspicious osseous appearing abnormalities. IMPRESSION: 1. Emphysematous hyperinflation of the lungs compatible COPD. No active pulmonary disease. 2. Small to moderate-sized hiatal hernia. 3. Clearing of bilateral posterior pleural effusions since prior. 4. Nodular densities seen at the same level bilaterally overlying the mid lung compatible with nipple shadows. These can be confirmed with repeat imaging and nipple markers in place to exclude pulmonary nodules. Electronically Signed   By: Meredith Leeds.D.  On: 04/16/2018 15:57    Procedures Procedures (including critical care time)  Medications Ordered in ED Medications  nitroGLYCERIN (NITROSTAT) SL tablet 0.4 mg (0.4 mg Sublingual Given 04/16/18 1521)  morphine 4 MG/ML injection 4 mg (has no administration in time range)  sodium chloride 0.9 % bolus 1,000 mL (has no administration in time range)     Initial Impression / Assessment and Plan / ED Course  I have reviewed the triage vital signs and the nursing notes.  Pertinent labs & imaging results that were available during my care of the patient were reviewed by me and considered in my medical decision making (see chart for details).    Patient is nontoxic-appearing, afebrile, and hemodynamically stable.  Differential diagnosis includes esophageal dysmotility, esophageal spasming, Schatzki's ring, achalasia, ACS, pancreatitis.  Will attempt nitroglycerin as well as morphine for pain control of esophageal spasming as well as pursue cardiac work-up.  Will consult gastroenterology.  N.p.o. at this time.   Chart reviewed, and appears that patient had clean coronary arteries  Feel that today's episode currently most likely represents a gastroenterology cause of symptoms.   Initial troponin is negative.  EKG is consistent with prior.  Chest x-ray demonstrates no  acute cardiopulmonary abnormality specifically no free air under the diaphragm, pneumatosis, or mediastinal air.  Per discussion with Dr. Benson Norway of gastroenterology, patient to be admitted, and likely will have a scope tomorrow.   Awaiting labs at this time.  Appreciate Dr. Ulyses Amor involvement in the care of this patient. Care signed out out to [facility], PA-C at 4:05 PM.   Final Clinical Impressions(s) / ED Diagnoses   Final diagnoses:  Epigastric pain  Regurgitation of food    ED Discharge Orders    None       Tamala Julian 04/16/18 1615    Blanchie Dessert, MD 04/17/18 [phone]

## 2018-04-16 NOTE — H&P (Addendum)
History and Physical  Charene Mccallister AJO:[id number] DOB: Jun 11, 1951 DOA: 04/16/2018  PCP: [name], MD   Chief Complaint: abdominal pain  HPI:  86yow PMH COPD, chronic hypoxic respiratory failure on oxygen, essential hypertension, esophageal problems requiring several episodes of dilatation, presented to the emergency department with 2-3-day history of increasing chest and abdominal pain after developing difficulty swallowing.  Has been unable to eat or drink anything for the last 2 days secondary to severe pain and some regurgitation.  Symptoms similar to previous esophageal problems.  Pain described as severe, pressure, improved with nitroglycerin in the emergency department, associated with some spitting up without frank vomiting. Shortness of breath at baseline.  Initial evaluation was reassuring, GI was contacted, plan for observation overnight, EGD in a.m.  ED Course: Treated with IV fluids, nitroglycerin, morphine. GI cocktail was ordered but patient could not tolerate.  Review of Systems:  Negative for fever, new visual changes, rash, new muscle aches, new shortness of breath, dysuria, bleeding  Positive for intermittent sore throat, chronic muscle aches, chest pain, chronic shortness of breath at baseline, bowels moving regularly, intermittent nausea.   Past Medical History:  Diagnosis Date  . Anemia   . Anxiety state 08/20/2015  . CAP (community acquired pneumonia)   . CHF (congestive heart failure) ([place])   . COPD (chronic obstructive pulmonary disease) ([place])   . Depression   . Essential hypertension 08/19/2015  . GERD (gastroesophageal reflux disease)   . Headache   . History of hiatal hernia   . Myocardial infarction ([place])   . Shortness of breath dyspnea     Past Surgical History:  Procedure Laterality Date  . CARDIAC CATHETERIZATION    . CARDIAC CATHETERIZATION N/A 08/26/2015   Procedure: Left Heart Cath and Coronary Angiography;  Surgeon: Jettie Booze, MD;   Location: [facility];  Service: Cardiovascular;  Laterality: N/A;  . tracheostomy       reports that she quit smoking about 17 years ago. Her smoking use included cigarettes. She has a 30.00 pack-year smoking history. She has never used smokeless tobacco. She reports that she does not drink alcohol or use drugs. Mobility: Ambulatory  Allergies  Allergen Reactions  . Penicillins Rash and Hives    Has patient had a PCN reaction causing immediate rash, facial/tongue/throat swelling, SOB or lightheadedness with hypotension: No Has patient had a PCN reaction causing severe rash involving mucus membranes or skin necrosis:NO Has patient had a PCN reaction that required hospitalization No Has patient had a PCN reaction occurring within the last 10 years:NO If all of the above answers are "NO", then may proceed with Cephalosporin use.  . Fentanyl And Related Other (See Comments)    Behavioral changes per daughter    Family History  Problem Relation Age of Onset  . Cirrhosis Mother   . Depression Mother   . Heart disease Father      Prior to Admission medications   Medication Sig Start Date End Date Taking? Authorizing Provider  albuterol (PROVENTIL HFA;VENTOLIN HFA) 108 (90 Base) MCG/ACT inhaler Inhale 2 puffs into the lungs every 6 (six) hours as needed for wheezing or shortness of breath. Insurance preference 03/31/18   Collene Gobble, MD  ALPRAZolam Duanne Moron) 0.25 MG tablet Take 1 tablet (0.25 mg total) by mouth 2 (two) times daily as needed. for anxiety 03/13/18   [name], MD  aspirin EC 81 MG tablet Take 81 mg by mouth daily.    [provider]  budesonide (PULMICORT) 0.5 MG/2ML nebulizer solution Take 2 mLs (0.5 mg total) by nebulization 2 (two) times daily. 03/08/17 04/07/17  Damita Lack, MD  butalbital-acetaminophen-caffeine (FIORICET, ESGIC) 253 160 0264 MG tablet Take 1 tablet by mouth 2 (two) times daily as needed for headache. 02/10/18   [name], MD    diltiazem (CARDIZEM CD) 240 MG 24 hr capsule Take 1 capsule (240 mg total) by mouth daily. 02/27/18   Roxan Hockey, MD  feeding supplement, ENSURE ENLIVE, (ENSURE ENLIVE) LIQD Take 237 mLs by mouth 4 (four) times daily. 01/11/17   Eugenie Filler, MD  fluticasone (FLONASE) 50 MCG/ACT nasal spray Place 2 sprays into both nostrils daily. 01/04/18   Marrian Salvage, FNP  Fluticasone-Salmeterol (ADVAIR DISKUS) 500-50 MCG/DOSE AEPB Inhale 1 puff into the lungs 2 (two) times daily. 02/27/18   Roxan Hockey, MD  Fluticasone-Umeclidin-Vilant (TRELEGY ELLIPTA) 100-62.5-25 MCG/INH AEPB Inhale 1 puff into the lungs daily. 03/31/18   Collene Gobble, MD  levalbuterol (XOPENEX HFA) 45 MCG/ACT inhaler INHALE 1 TO 2 PUFFS INTO THE LUNGS EVERY 4 HOURS AS NEEDED FOR WHEEZING 08/01/17   Golden Circle, FNP  levalbuterol (XOPENEX) 1.25 MG/0.5ML nebulizer solution Take 1.25 mg by nebulization every 6 (six) hours. 02/27/18   Roxan Hockey, MD  loratadine (CLARITIN) 10 MG tablet Take 1 tablet (10 mg total) daily by mouth. 09/16/17   [name], MD  metoprolol tartrate (LOPRESSOR) 25 MG tablet Take 0.5 tablets (12.5 mg total) by mouth 2 (two) times daily. 02/27/18   Roxan Hockey, MD  pantoprazole (PROTONIX) 40 MG tablet Take 1 tablet (40 mg total) by mouth daily. 02/28/18   Roxan Hockey, MD  polyethylene glycol (MIRALAX / GLYCOLAX) packet Take 17 g by mouth 2 (two) times daily. 01/11/17   Eugenie Filler, MD  predniSONE (DELTASONE) 20 MG tablet Take 1 tablet (20 mg total) by mouth daily with breakfast. 02/27/18   Emokpae, Courage, MD  senna-docusate (SENOKOT-S) 8.6-50 MG tablet Take 1 tablet by mouth 2 (two) times daily. 02/27/18   Roxan Hockey, MD  sertraline (ZOLOFT) 50 MG tablet Take 1 tablet (50 mg total) by mouth daily. 02/27/18   Roxan Hockey, MD  tiZANidine (ZANAFLEX) 4 MG tablet TAKE 1 TABLET(4 MG) BY MOUTH EVERY 6 HOURS AS NEEDED FOR MUSCLE SPASMS 09/16/17   [name], MD   umeclidinium bromide (INCRUSE ELLIPTA) 62.5 MCG/INH AEPB Inhale 1 puff into the lungs daily. 02/27/18   Roxan Hockey, MD    Physical Exam: Vitals:   04/16/18 1715 04/16/18 1730  BP: (!) 160/74 (!) 157/75  Pulse: 79 90  Resp: 16 (!) 24  Temp:    SpO2: 97% 98%    Constitutional:   . Appears calm, mildly uncomfortable, nontoxic. Eyes:  . pupils and irises appear normal.  Cataract right eye. . Normal lids  ENMT:  . Slightly hard of hearing. . Lips appear normal Neck:  . neck appears normal, no masses . no thyromegaly Respiratory:  . CTA bilaterally, no w/r/r.,  Fair air movement, decreased breath sounds. Marland Kitchen Respiratory effort normal.  Able to speak in full sentences. Cardiovascular:  . RRR, no m/r/g . No LE extremity edema   . Symmetric, normal radial pulses bilaterally . Symmetric, normal DP pulses bilaterally Abdomen:  . Abdomen appears normal, moderate epigastric tenderness with palpation, no rebound.  Mild guarding of the epigastrium. . No hernias noted Musculoskeletal:  . RUE, LUE, RLE, LLE   o strength and tone normal, no atrophy, no abnormal movements  o No tenderness, masses Skin:  . No rashes, lesions, ulcers . palpation of skin: no induration or nodules Psychiatric:  . Mental status o Mood, affect appropriate . judgment and insight appear intact    I have personally reviewed following labs and imaging studies  Labs:   Complete metabolic panel unremarkable  Troponin negative  Lactic acid within normal limits  CBC unremarkable  Urinalysis negative  Imaging studies:   Newly reviewed, no acute disease, hyperinflation noted.  Medical tests:   EKG independently reviewed: Sinus rhythm, bilateral atrial enlargement, pulmonary disease pattern, incomplete right bundle branch block, old.  Large T waves, similar to previous study 02/19/2018.   Principal Problem:   Epigastric pain Active Problems:   Essential hypertension   Esophageal reflux    COPD (chronic obstructive pulmonary disease) (HCC)   Abdominal pain   Chest pain   Chronic respiratory failure with hypoxia (HCC)   Dysphagia   Odynophagia   Assessment/Plan Chest and epigastric abdominal pain, inability to swallow solids and liquids without severe pain, with history of esophageal problems requiring dilatation in the past.  Initial troponin negative, EKG consistent with previous.  History and clinical findings most suggestive of esophageal abnormality, including dysmotility, spasm, anatomical abnormality or achalasia.  I do not suspect ACS at this time.  Cardiac catheterization 2016 with no significant coronary artery disease.  2D echocardiogram 01/2018 normal LV function 60-65%.  Grade 1 diastolic dysfunction.  GI has been consulted with plan for further evaluation and EGD 6/17. --Keep n.p.o., IV fluids, IV H2 blocker (Protonix on back order) --Analgesics, antiemetics --Serial troponin, repeat EKG in a.m.  COPD, chronic hypoxic respiratory failure on home oxygen --Appears stable at this point.  Continue bronchodilators, oxygen.  Essential hypertension --Stable.  Hold beta-blocker and calcium channel blocker.  Schedule IV metoprolol.  GERD --H2 blocker.  Severity of Illness: The appropriate patient status for this patient is OBSERVATION. Observation status is judged to be reasonable and necessary in order to provide the required intensity of service to ensure the patient's safety. The patient's presenting symptoms, physical exam findings, and initial radiographic and laboratory data in the context of their medical condition is felt to place them at decreased risk for further clinical deterioration. Furthermore, it is anticipated that the patient will be medically stable for discharge from the hospital within 2 midnights of admission. The following factors support the patient status of observation.   See immediately above  DVT prophylaxis:SCDs Code Status: Full Family  Communication: none Consults called: GI    Time spent: 60 minutes  Murray Hodgkins, MD  [facility] Direct contact: [phone] --[place] [place]  --[email or web address]; password [email or web address]  7PM-7AM contact night coverage as above  04/16/2018, 6:15 PM

## 2018-04-16 NOTE — ED Triage Notes (Addendum)
Per [facility], pt coming from home with upper abdominal pain, lower chest pain radiating to back, and vomiting x2 days. Pt reports hx of esophageal stretching and feels the same. Pt reports chugging water yesterday and feeling "something rip" on left side of abdomen. Pt always on 2L [place].

## 2018-04-16 NOTE — ED Notes (Signed)
Admitting at bedside 

## 2018-04-16 NOTE — Progress Notes (Signed)
Patient  Refused breathing tx due to nausea and did not want to be woken for AM treatment, she just wants to rest for the evening, no distress noted.

## 2018-04-16 NOTE — ED Provider Notes (Signed)
Pt signed out to me by Megan Valere Dross, PA-C. Please see previous notes for further history.   In brief, pt presenting for evaluation of abd pain, vomiting, and chest pn. Pt states hs has Megan h/o esophageal spasms/stricture requiring dilatation. Her sxs are consistent with prior episodes. She has been unable to tolerate p.o. for the past 2 days, unable to tolerate liquids today.  She has not been able to establish with GI in [place] since moving down.  History of COPD on oxygen at baseline.  Discussed with Dr. Benson Norway from GI who recommends admission to the hospital under hospitalist service for sx control with plan for EGD, does not recommend EGD done emergently today.   Labs reassuring, no leukocytosis.  Electrolytes stable.  Bicarb mildly elevated at 35.  On reassessment, patient reports symptoms are improved with sublingual nitro and morphine.  No longer having severe pain.  Reporting continued pressure, like something is stuck in her throat.  Will consult with hospitalist for admission.  Discussed with hospitalist, patient to be admitted under [facility] service.   Franchot Heidelberg, PA-C 04/16/18 1745    Hayden Rasmussen, MD 04/17/18 1140

## 2018-04-16 NOTE — Consult Note (Signed)
Consult Note for Megan [name]  Reason for Consult: Dysphagia Referring Physician: [facility]  Megan [name] HPI: This is a 67 year old female with a PMH of COPD with oxygen dependency who presents to the ER with complaints of dysphagia.  She started to have an acute onset of her symptoms yesterday and it was progressive.  In the past, she suffered with these types of symptoms, but the majority of the time they would pass.  She recalls needing an EGD 2-3 times when she was living in [place] and dilation was performed.  Today she attempted to improve the symptoms by forcing a large bolus of water down her esophagus.  Prior to this time she was regurgitating her PO intake.  The fluid bolus passed through, but it caused her to have severe pain in the epigastric region and the LUQ.  Because of the persistent pain issues she presented to the ER.  She reports a history of esophageal spasm and there was a component of chest pain.  Two SL NTG were administered, which helped with her symptoms temporarily.  She is able to manage her oral secretions.  The patient does not recall being given a diagnosis of GERD or taking a PPI on a routine basis.  Past Medical History:  Diagnosis Date  . Anemia   . Anxiety state 08/20/2015  . CAP (community acquired pneumonia)   . CHF (congestive heart failure) ([place])   . COPD (chronic obstructive pulmonary disease) ([place])   . Depression   . Essential hypertension 08/19/2015  . GERD (gastroesophageal reflux disease)   . Headache   . History of hiatal hernia   . Myocardial infarction ([place])   . Shortness of breath dyspnea     Past Surgical History:  Procedure Laterality Date  . CARDIAC CATHETERIZATION    . CARDIAC CATHETERIZATION N/A 08/26/2015   Procedure: Left Heart Cath and Coronary Angiography;  Surgeon: Jettie Booze, MD;  Location: [facility];  Service: Cardiovascular;  Laterality: N/A;  . tracheostomy      Family History  Problem Relation Age of  Onset  . Cirrhosis Mother   . Depression Mother   . Heart disease Father     Social History:  reports that she quit smoking about 17 years ago. Her smoking use included cigarettes. She has a 30.00 pack-year smoking history. She has never used smokeless tobacco. She reports that she does not drink alcohol or use drugs.  Allergies:  Allergies  Allergen Reactions  . Penicillins Rash and Hives    Has patient had a PCN reaction causing immediate rash, facial/tongue/throat swelling, SOB or lightheadedness with hypotension: No Has patient had a PCN reaction causing severe rash involving mucus membranes or skin necrosis:NO Has patient had a PCN reaction that required hospitalization No Has patient had a PCN reaction occurring within the last 10 years:NO If all of the above answers are "NO", then may proceed with Cephalosporin use.  . Fentanyl And Related Other (See Comments)    Behavioral changes per daughter    Medications: Scheduled:  Continuous:   Results for orders placed or performed during the hospital encounter of 04/16/18 (from the past 24 hour(s))  Basic metabolic panel     Status: Abnormal   Collection Time: 04/16/18  2:30 PM  Result Value Ref Range   Sodium 138 135 - 145 mmol/L   Potassium 4.8 3.5 - 5.1 mmol/L   Chloride 93 (L) 101 - 111 mmol/L   CO2 35 (H) 22 -  32 mmol/L   Glucose, Bld 135 (H) 65 - 99 mg/dL   BUN 9 6 - 20 mg/dL   Creatinine, Ser 0.68 0.44 - 1.00 mg/dL   Calcium 9.4 8.9 - 10.3 mg/dL   GFR calc non Af Amer >60 >60 mL/min   GFR calc Af Amer >60 >60 mL/min   Anion gap 10 5 - 15  CBC     Status: Abnormal   Collection Time: 04/16/18  2:30 PM  Result Value Ref Range   WBC 7.2 4.0 - 10.5 K/uL   RBC 3.83 (L) 3.87 - 5.11 MIL/uL   Hemoglobin 11.2 (L) 12.0 - 15.0 g/dL   HCT 36.8 36.0 - 46.0 %   MCV 96.1 78.0 - 100.0 fL   MCH 29.2 26.0 - 34.0 pg   MCHC 30.4 30.0 - 36.0 g/dL   RDW 13.3 11.5 - 15.5 %   Platelets 379 150 - 400 K/uL  Lipase, blood     Status:  None   Collection Time: 04/16/18  2:30 PM  Result Value Ref Range   Lipase 25 11 - 51 U/L  Hepatic function panel     Status: None   Collection Time: 04/16/18  2:30 PM  Result Value Ref Range   Total Protein 7.2 6.5 - 8.1 g/dL   Albumin 3.8 3.5 - 5.0 g/dL   AST 30 15 - 41 U/L   ALT 20 14 - 54 U/L   Alkaline Phosphatase 67 38 - 126 U/L   Total Bilirubin 0.6 0.3 - 1.2 mg/dL   Bilirubin, Direct 0.3 0.1 - 0.5 mg/dL   Indirect Bilirubin 0.3 0.3 - 0.9 mg/dL  I-stat troponin, ED     Status: None   Collection Time: 04/16/18  2:42 PM  Result Value Ref Range   Troponin i, poc 0.00 0.00 - 0.08 ng/mL   Comment 3          Urinalysis, Routine w reflex microscopic     Status: Abnormal   Collection Time: 04/16/18  4:17 PM  Result Value Ref Range   Color, Urine STRAW (A) YELLOW   APPearance CLEAR CLEAR   Specific Gravity, Urine 1.008 1.005 - 1.030   pH 9.0 (H) 5.0 - 8.0   Glucose, UA NEGATIVE NEGATIVE mg/dL   Hgb urine dipstick NEGATIVE NEGATIVE   Bilirubin Urine NEGATIVE NEGATIVE   Ketones, ur NEGATIVE NEGATIVE mg/dL   Protein, ur NEGATIVE NEGATIVE mg/dL   Nitrite NEGATIVE NEGATIVE   Leukocytes, UA NEGATIVE NEGATIVE  I-Stat beta hCG blood, ED ([facility], [facility], [facility] only)     Status: None   Collection Time: 04/16/18  4:42 PM  Result Value Ref Range   I-stat hCG, quantitative <5.0 <5 mIU/mL   Comment 3          I-Stat CG4 Lactic Acid, ED     Status: None   Collection Time: 04/16/18  5:36 PM  Result Value Ref Range   Lactic Acid, Venous 1.41 0.5 - 1.9 mmol/L  I-Stat CG4 Lactic Acid, ED     Status: None   Collection Time: 04/16/18  5:51 PM  Result Value Ref Range   Lactic Acid, Venous 0.75 0.5 - 1.9 mmol/L     Dg Chest 2 View  Result Date: 04/16/2018 CLINICAL DATA:  Upper abdominal and lower chest pain radiating to the back with vomiting. EXAM: CHEST - 2 VIEW COMPARISON:  CT 02/23/2018 and CXR 02/20/2018 FINDINGS: The heart size and mediastinal contours are within normal limits. Nonaneurysmal  atherosclerotic aorta. Emphysematous  hyperinflation of the lungs with clearing of posterior pleural effusions since prior exam. Nodular densities compatible with nipple shadows are seen bilaterally overlying the mid lung, best seen on the frontal view. These can be confirmed with nipple markers and repeat imaging. Superimposed small moderate-sized hiatal hernia. Osteopenic appearance of the bony thorax without acute nor suspicious osseous appearing abnormalities. IMPRESSION: 1. Emphysematous hyperinflation of the lungs compatible COPD. No active pulmonary disease. 2. Small to moderate-sized hiatal hernia. 3. Clearing of bilateral posterior pleural effusions since prior. 4. Nodular densities seen at the same level bilaterally overlying the mid lung compatible with nipple shadows. These can be confirmed with repeat imaging and nipple markers in place to exclude pulmonary nodules. Electronically Signed   By: Ashley Royalty M.D.   On: 04/16/2018 15:57    ROS:  As stated above in the HPI otherwise negative.  Blood pressure (!) 157/75, pulse 90, temperature 98.4 F (36.9 C), temperature source Oral, resp. rate (!) 24, height 5\' 3"  (1.6 m), weight 40.8 kg (90 lb), SpO2 98 %.    PE: Gen: NAD, Alert and Oriented HEENT:  [place]/AT, EOMI Neck: Supple, no LAD Lungs: CTA Bilaterally CV: RRR without M/G/R ABM: Soft, tenderness in the epigastric and LUQ - ? Carnett's sign, +BS Ext: No C/C/E  Assessment/Plan: 1) Dysphagia. 2) COPD - oxygen dependency. 3) Epigastric/LUQ pain.   She has a recurrent history of dysphagia and the majority of the time it does improve on its own.  She did have some temporary improvement with using SL NTG.  In this instance, it will be helpful to obtain an esophagram with a 13 mm tablet first.  Her symptoms may purely be motility rather than a fixed stricture.  An EGD will most likely also be required.  Plan: 1) Esophagram with 13 mm tablet.  Megan [name] D 04/16/2018, 6:07 PM

## 2018-04-17 ENCOUNTER — Other Ambulatory Visit: Payer: Self-pay

## 2018-04-17 ENCOUNTER — Observation Stay (HOSPITAL_COMMUNITY): Payer: Medicare Other

## 2018-04-17 ENCOUNTER — Telehealth: Payer: Self-pay | Admitting: Emergency Medicine

## 2018-04-17 DIAGNOSIS — J9611 Chronic respiratory failure with hypoxia: Secondary | ICD-10-CM | POA: Diagnosis not present

## 2018-04-17 DIAGNOSIS — R109 Unspecified abdominal pain: Secondary | ICD-10-CM | POA: Diagnosis present

## 2018-04-17 DIAGNOSIS — R079 Chest pain, unspecified: Secondary | ICD-10-CM | POA: Diagnosis not present

## 2018-04-17 DIAGNOSIS — I119 Hypertensive heart disease without heart failure: Secondary | ICD-10-CM | POA: Diagnosis not present

## 2018-04-17 DIAGNOSIS — X58XXXA Exposure to other specified factors, initial encounter: Secondary | ICD-10-CM | POA: Diagnosis present

## 2018-04-17 DIAGNOSIS — T18128A Food in esophagus causing other injury, initial encounter: Secondary | ICD-10-CM | POA: Diagnosis not present

## 2018-04-17 DIAGNOSIS — R1013 Epigastric pain: Secondary | ICD-10-CM | POA: Diagnosis not present

## 2018-04-17 DIAGNOSIS — D649 Anemia, unspecified: Secondary | ICD-10-CM | POA: Diagnosis not present

## 2018-04-17 DIAGNOSIS — R402413 Glasgow coma scale score 13-15, at hospital admission: Secondary | ICD-10-CM | POA: Diagnosis present

## 2018-04-17 DIAGNOSIS — T18128S Food in esophagus causing other injury, sequela: Secondary | ICD-10-CM | POA: Diagnosis not present

## 2018-04-17 DIAGNOSIS — R1310 Dysphagia, unspecified: Secondary | ICD-10-CM | POA: Diagnosis not present

## 2018-04-17 DIAGNOSIS — M797 Fibromyalgia: Secondary | ICD-10-CM | POA: Diagnosis present

## 2018-04-17 DIAGNOSIS — Q396 Congenital diverticulum of esophagus: Secondary | ICD-10-CM | POA: Diagnosis not present

## 2018-04-17 DIAGNOSIS — K219 Gastro-esophageal reflux disease without esophagitis: Secondary | ICD-10-CM | POA: Diagnosis present

## 2018-04-17 DIAGNOSIS — F329 Major depressive disorder, single episode, unspecified: Secondary | ICD-10-CM | POA: Diagnosis not present

## 2018-04-17 DIAGNOSIS — Z9981 Dependence on supplemental oxygen: Secondary | ICD-10-CM | POA: Diagnosis not present

## 2018-04-17 DIAGNOSIS — K449 Diaphragmatic hernia without obstruction or gangrene: Secondary | ICD-10-CM | POA: Diagnosis not present

## 2018-04-17 DIAGNOSIS — R933 Abnormal findings on diagnostic imaging of other parts of digestive tract: Secondary | ICD-10-CM

## 2018-04-17 DIAGNOSIS — G4730 Sleep apnea, unspecified: Secondary | ICD-10-CM | POA: Diagnosis present

## 2018-04-17 DIAGNOSIS — J449 Chronic obstructive pulmonary disease, unspecified: Secondary | ICD-10-CM | POA: Diagnosis not present

## 2018-04-17 DIAGNOSIS — K228 Other specified diseases of esophagus: Secondary | ICD-10-CM | POA: Diagnosis not present

## 2018-04-17 DIAGNOSIS — J181 Lobar pneumonia, unspecified organism: Secondary | ICD-10-CM | POA: Diagnosis not present

## 2018-04-17 DIAGNOSIS — F419 Anxiety disorder, unspecified: Secondary | ICD-10-CM | POA: Diagnosis present

## 2018-04-17 DIAGNOSIS — I4519 Other right bundle-branch block: Secondary | ICD-10-CM | POA: Diagnosis present

## 2018-04-17 DIAGNOSIS — J189 Pneumonia, unspecified organism: Secondary | ICD-10-CM | POA: Diagnosis not present

## 2018-04-17 DIAGNOSIS — K224 Dyskinesia of esophagus: Secondary | ICD-10-CM | POA: Diagnosis not present

## 2018-04-17 DIAGNOSIS — R0781 Pleurodynia: Secondary | ICD-10-CM | POA: Diagnosis not present

## 2018-04-17 DIAGNOSIS — I10 Essential (primary) hypertension: Secondary | ICD-10-CM | POA: Diagnosis not present

## 2018-04-17 DIAGNOSIS — R51 Headache: Secondary | ICD-10-CM | POA: Diagnosis present

## 2018-04-17 DIAGNOSIS — J9612 Chronic respiratory failure with hypercapnia: Secondary | ICD-10-CM | POA: Diagnosis not present

## 2018-04-17 DIAGNOSIS — J440 Chronic obstructive pulmonary disease with acute lower respiratory infection: Secondary | ICD-10-CM | POA: Diagnosis not present

## 2018-04-17 LAB — CBC
HEMATOCRIT: 34.8 % — AB (ref 36.0–46.0)
Hemoglobin: 10.7 g/dL — ABNORMAL LOW (ref 12.0–15.0)
MCH: 29.7 pg (ref 26.0–34.0)
MCHC: 30.7 g/dL (ref 30.0–36.0)
MCV: 96.7 fL (ref 78.0–100.0)
PLATELETS: 354 10*3/uL (ref 150–400)
RBC: 3.6 MIL/uL — ABNORMAL LOW (ref 3.87–5.11)
RDW: 13.1 % (ref 11.5–15.5)
WBC: 6.3 10*3/uL (ref 4.0–10.5)

## 2018-04-17 LAB — BASIC METABOLIC PANEL
Anion gap: 7 (ref 5–15)
BUN: 9 mg/dL (ref 6–20)
CO2: 36 mmol/L — ABNORMAL HIGH (ref 22–32)
Calcium: 8.7 mg/dL — ABNORMAL LOW (ref 8.9–10.3)
Chloride: 96 mmol/L — ABNORMAL LOW (ref 101–111)
Creatinine, Ser: 0.67 mg/dL (ref 0.44–1.00)
GFR calc Af Amer: >60 mL/min (ref >60)
GLUCOSE: 71 mg/dL (ref 65–99)
POTASSIUM: 4.1 mmol/L (ref 3.5–5.1)
SODIUM: 139 mmol/L (ref 135–145)

## 2018-04-17 LAB — MRSA PCR SCREENING: MRSA BY PCR: POSITIVE — AB

## 2018-04-17 LAB — TROPONIN I: Troponin I: <0.03 ng/mL (ref <0.03)

## 2018-04-17 MED ORDER — CHLORHEXIDINE GLUCONATE CLOTH 2 % EX PADS
6.0000 | MEDICATED_PAD | Freq: Every day | CUTANEOUS | Status: DC
  Administered 2018-04-17 – 2018-04-21 (×4): 6 via TOPICAL

## 2018-04-17 MED ORDER — LEVALBUTEROL HCL 1.25 MG/0.5ML IN NEBU
1.2500 mg | INHALATION_SOLUTION | Freq: Four times a day (QID) | RESPIRATORY_TRACT | Status: DC
  Administered 2018-04-17 – 2018-04-18 (×4): 1.25 mg via RESPIRATORY_TRACT
  Filled 2018-04-17 (×7): qty 0.5

## 2018-04-17 MED ORDER — MUPIROCIN 2 % EX OINT
1.0000 "application " | TOPICAL_OINTMENT | Freq: Two times a day (BID) | CUTANEOUS | Status: DC
  Administered 2018-04-17 – 2018-04-21 (×9): 1 via NASAL
  Filled 2018-04-17: qty 22

## 2018-04-17 NOTE — H&P (View-Only) (Signed)
Daily Rounding Note  04/17/2018, 8:44 AM  LOS: 0 days   SUBJECTIVE:   Chief complaint: difficulty swallowing.  Previously intermittent, now persistent to solids/liquids along with epigastric pain and foamy, clear regurgitation.      OBJECTIVE:         Vital signs in last 24 hours:    Temp:  [98 F (36.7 C)-98.4 F (36.9 C)] 98.3 F (36.8 C) (06/17 0522) Pulse Rate:  [63-92] 63 (06/17 0522) Resp:  [13-24] 18 (06/17 0522) BP: (135-162)/(57-81) 143/63 (06/17 0522) SpO2:  [93 %-100 %] 100 % (06/17 0522) Weight:  [87 lb 11.2 oz (39.8 kg)-90 lb (40.8 kg)] 87 lb 11.2 oz (39.8 kg) (06/16 1959) Last BM Date: 04/15/18 Filed Weights   04/16/18 1425 04/16/18 1959  Weight: 90 lb (40.8 kg) 87 lb 11.2 oz (39.8 kg)   General: NAD.  Looks old for age   Heart: RRR Chest: clear bil.  No labored breathing Abdomen: soft, NT, ND.  BS active  Extremities: no CCE Neuro/Psych:  Oriented x 3.  Alert.   No gross deficits.    Intake/Output from previous day: 06/16 0701 - 06/17 0700 In: 1981.7 [I.V.:931.3; IV Piggyback:1050.4] Out: -   Intake/Output this shift: No intake/output data recorded.  Lab Results: Recent Labs    04/16/18 1430 04/17/18 0507  WBC 7.2 6.3  HGB 11.2* 10.7*  HCT 36.8 34.8*  PLT 379 354   BMET Recent Labs    04/16/18 1430 04/17/18 0507  NA 138 139  K 4.8 4.1  CL 93* 96*  CO2 35* 36*  GLUCOSE 135* 71  BUN 9 9  CREATININE 0.68 0.67  CALCIUM 9.4 8.7*   LFT Recent Labs    04/16/18 1430  PROT 7.2  ALBUMIN 3.8  AST 30  ALT 20  ALKPHOS 67  BILITOT 0.6  BILIDIR 0.3  IBILI 0.3    Studies/Results: Dg Chest 2 View  Result Date: 04/16/2018 CLINICAL DATA:  Upper abdominal and lower chest pain radiating to the back with vomiting. EXAM: CHEST - 2 VIEW COMPARISON:  CT 02/23/2018 and CXR 02/20/2018 FINDINGS: The heart size and mediastinal contours are within normal limits. Nonaneurysmal  atherosclerotic aorta. Emphysematous hyperinflation of the lungs with clearing of posterior pleural effusions since prior exam. Nodular densities compatible with nipple shadows are seen bilaterally overlying the mid lung, best seen on the frontal view. These can be confirmed with nipple markers and repeat imaging. Superimposed small moderate-sized hiatal hernia. Osteopenic appearance of the bony thorax without acute nor suspicious osseous appearing abnormalities. IMPRESSION: 1. Emphysematous hyperinflation of the lungs compatible COPD. No active pulmonary disease. 2. Small to moderate-sized hiatal hernia. 3. Clearing of bilateral posterior pleural effusions since prior. 4. Nodular densities seen at the same level bilaterally overlying the mid lung compatible with nipple shadows. These can be confirmed with repeat imaging and nipple markers in place to exclude pulmonary nodules. Electronically Signed   By: Ashley Royalty M.D.   On: 04/16/2018 15:57   Scheduled Meds: . aspirin  300 mg Rectal Daily  . Chlorhexidine Gluconate Cloth  6 each Topical Q0600  . fluticasone furoate-vilanterol  1 puff Inhalation Daily   And  . umeclidinium bromide  1 puff Inhalation Daily  . levalbuterol  1.25 mg Nebulization Q6H  . metoprolol tartrate  5 mg Intravenous Q6H  . mupirocin ointment  1 application Nasal BID  . sodium chloride flush  3 mL Intravenous Q12H   Continuous Infusions: .  famotidine (PEPCID) IV Stopped (04/16/18 2140)  . lactated ringers 100 mL/hr at 04/16/18 2104   PRN Meds:.acetaminophen **OR** acetaminophen, LORazepam, morphine injection, nitroGLYCERIN, nitroGLYCERIN, ondansetron **OR** ondansetron (ZOFRAN) IV, promethazine   ASSESMENT:   *  Dysphagia, odynophagia, epigastric pain.  No chronic PPI.  sxs are in upper and lower Esophageal areas.    Previous EGDs and dilations in [place] and latest at [facility] in [place] 06/2014.      *  O2 dependent, severe COPD.  Uses Trilogy vent at night.   Admission 01/2018 with flare, sepsis, shock requiring Bipap.    *   Normocytic anemia.     PLAN   *  Await esophagram and determine if EGD indicated.  Pt endorsing need for EGD as she says she can not eat due to dysphagia.     Megan [name]  04/17/2018, 8:44 AM Phone [phone]    Attending physician's note   I have taken an interval history, reviewed the chart and examined the patient. I agree with the Advanced Practitioner's note, impression and recommendations.   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 Large hiatal hernia with stasis of contrast. 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  EGD tomorrow to further evaluate however symptoms are likely from esophageal dysmotility and large hiatal hernia.   Lucio Edward, MD FACG [phone] office

## 2018-04-17 NOTE — Telephone Encounter (Signed)
03/11/2018 30# 

## 2018-04-17 NOTE — Telephone Encounter (Signed)
Checked RB's cubby and front folder, and did not see any forms on this patient.   lmtcb for Megan [name] Comment to have form refaxed.

## 2018-04-17 NOTE — Progress Notes (Signed)
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 Positive MRSA standing orders implemented. Will continue to monitor and treat per MD orders.

## 2018-04-17 NOTE — Progress Notes (Addendum)
Daily Rounding Note  04/17/2018, 8:44 AM  LOS: 0 days   SUBJECTIVE:   Chief complaint: difficulty swallowing.  Previously intermittent, now persistent to solids/liquids along with epigastric pain and foamy, clear regurgitation.      OBJECTIVE:         Vital signs in last 24 hours:    Temp:  [98 F (36.7 C)-98.4 F (36.9 C)] 98.3 F (36.8 C) (06/17 0522) Pulse Rate:  [63-92] 63 (06/17 0522) Resp:  [13-24] 18 (06/17 0522) BP: (135-162)/(57-81) 143/63 (06/17 0522) SpO2:  [93 %-100 %] 100 % (06/17 0522) Weight:  [87 lb 11.2 oz (39.8 kg)-90 lb (40.8 kg)] 87 lb 11.2 oz (39.8 kg) (06/16 1959) Last BM Date: 04/15/18 Filed Weights   04/16/18 1425 04/16/18 1959  Weight: 90 lb (40.8 kg) 87 lb 11.2 oz (39.8 kg)   General: NAD.  Looks old for age   Heart: RRR Chest: clear bil.  No labored breathing Abdomen: soft, NT, ND.  BS active  Extremities: no CCE Neuro/Psych:  Oriented x 3.  Alert.   No gross deficits.    Intake/Output from previous day: 06/16 0701 - 06/17 0700 In: 1981.7 [I.V.:931.3; IV Piggyback:1050.4] Out: -   Intake/Output this shift: No intake/output data recorded.  Lab Results: Recent Labs    04/16/18 1430 04/17/18 0507  WBC 7.2 6.3  HGB 11.2* 10.7*  HCT 36.8 34.8*  PLT 379 354   BMET Recent Labs    04/16/18 1430 04/17/18 0507  NA 138 139  K 4.8 4.1  CL 93* 96*  CO2 35* 36*  GLUCOSE 135* 71  BUN 9 9  CREATININE 0.68 0.67  CALCIUM 9.4 8.7*   LFT Recent Labs    04/16/18 1430  PROT 7.2  ALBUMIN 3.8  AST 30  ALT 20  ALKPHOS 67  BILITOT 0.6  BILIDIR 0.3  IBILI 0.3    Studies/Results: Dg Chest 2 View  Result Date: 04/16/2018 CLINICAL DATA:  Upper abdominal and lower chest pain radiating to the back with vomiting. EXAM: CHEST - 2 VIEW COMPARISON:  CT 02/23/2018 and CXR 02/20/2018 FINDINGS: The heart size and mediastinal contours are within normal limits. Nonaneurysmal  atherosclerotic aorta. Emphysematous hyperinflation of the lungs with clearing of posterior pleural effusions since prior exam. Nodular densities compatible with nipple shadows are seen bilaterally overlying the mid lung, best seen on the frontal view. These can be confirmed with nipple markers and repeat imaging. Superimposed small moderate-sized hiatal hernia. Osteopenic appearance of the bony thorax without acute nor suspicious osseous appearing abnormalities. IMPRESSION: 1. Emphysematous hyperinflation of the lungs compatible COPD. No active pulmonary disease. 2. Small to moderate-sized hiatal hernia. 3. Clearing of bilateral posterior pleural effusions since prior. 4. Nodular densities seen at the same level bilaterally overlying the mid lung compatible with nipple shadows. These can be confirmed with repeat imaging and nipple markers in place to exclude pulmonary nodules. Electronically Signed   By: Ashley Royalty M.D.   On: 04/16/2018 15:57   Scheduled Meds: . aspirin  300 mg Rectal Daily  . Chlorhexidine Gluconate Cloth  6 each Topical Q0600  . fluticasone furoate-vilanterol  1 puff Inhalation Daily   And  . umeclidinium bromide  1 puff Inhalation Daily  . levalbuterol  1.25 mg Nebulization Q6H  . metoprolol tartrate  5 mg Intravenous Q6H  . mupirocin ointment  1 application Nasal BID  . sodium chloride flush  3 mL Intravenous Q12H   Continuous Infusions: .  famotidine (PEPCID) IV Stopped (04/16/18 2140)  . lactated ringers 100 mL/hr at 04/16/18 2104   PRN Meds:.acetaminophen **OR** acetaminophen, LORazepam, morphine injection, nitroGLYCERIN, nitroGLYCERIN, ondansetron **OR** ondansetron (ZOFRAN) IV, promethazine   ASSESMENT:   *  Dysphagia, odynophagia, epigastric pain.  No chronic PPI.  sxs are in upper and lower Esophageal areas.    Previous EGDs and dilations in [place] and latest at [facility] in [place] 06/2014.      *  O2 dependent, severe COPD.  Uses Trilogy vent at night.   Admission 01/2018 with flare, sepsis, shock requiring Bipap.    *   Normocytic anemia.     PLAN   *  Await esophagram and determine if EGD indicated.  Pt endorsing need for EGD as she says she can not eat due to dysphagia.     Azucena Freed  04/17/2018, 8:44 AM Phone [phone]    Attending physician's note   I have taken an interval history, reviewed the chart and examined the patient. I agree with the Advanced Practitioner's note, impression and recommendations.   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 Large hiatal hernia with stasis of contrast. 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  EGD tomorrow to further evaluate however symptoms are likely from esophageal dysmotility and large hiatal hernia.   Lucio Edward, MD FACG [phone] office

## 2018-04-17 NOTE — Telephone Encounter (Signed)
Done erx 

## 2018-04-17 NOTE — Progress Notes (Signed)
Progress Note    Megan [name]  WUJ:[id number] DOB: Apr 28, 1951  DOA: 04/16/2018 PCP: [name], MD    Brief Narrative:   Medical records reviewed and are as summarized below:  Megan [name] is an 67 y.o. female PMH COPD, chronic hypoxic respiratory failure on oxygen, essential hypertension, esophageal problems requiring several episodes of dilatation, presented to the emergency department with 2-3-day history of increasing chest and abdominal pain after developing difficulty swallowing.  Has been unable to eat or drink anything for the last 2 days secondary to severe pain and some regurgitation.  Symptoms similar to previous esophageal problems.  Pain described as severe, pressure, improved with nitroglycerin in the emergency department, associated with some spitting up without frank vomiting.    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 Large hiatal hernia with stasis of contrast. 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 -for EGD in AM  COPD, chronic hypoxic respiratory failure on home oxygen --Appears stable at this point.  Continue bronchodilators, oxygen.  Essential hypertension --Stable.  Hold beta-blocker and calcium channel blocker.  Schedule IV metoprolol until able to take PO  GERD --H2 blocker    Family Communication/Anticipated D/C date and plan/Code Status   DVT prophylaxis: scd Code Status: Full Code.  Family Communication:  Disposition Plan: EGD in AM   Medical Consultants:    GI   Subjective:   Going down for  swallow evaluation  Objective:    Vitals:   04/17/18 0522 04/17/18 1140 04/17/18 1335 04/17/18 1414  BP: (!) 143/63 (!) 144/67 (!) 140/59   Pulse: 63  63   Resp: 18  18   Temp: 98.3 F (36.8 C)  98 F (36.7 C)   TempSrc: Oral  Oral   SpO2: 100%  100% 93%  Weight:      Height:        Intake/Output Summary (Last 24 hours) at 04/17/2018 1442 Last data filed at 04/17/2018 1033 Gross per 24 hour  Intake 1981.73 ml  Output -  Net 1981.73 ml   Filed Weights   04/16/18 1425 04/16/18 1959  Weight: 40.8 kg (90 lb) 39.8 kg (87 lb 11.2 oz)    Exam: In bed, NAD   Data Reviewed:   I have personally reviewed following labs and imaging studies:  Labs: Labs show the following:   Basic Metabolic Panel: Recent Labs  Lab 04/16/18 1430 04/17/18 0507  NA 138 139  K 4.8 4.1  CL 93* 96*  CO2 35* 36*  GLUCOSE 135* 71  BUN 9 9  CREATININE 0.68 0.67  CALCIUM 9.4 8.7*   GFR Estimated Creatinine Clearance: 44 mL/min (by C-G formula based on SCr of 0.67 mg/dL). Liver Function Tests: Recent Labs  Lab 04/16/18 1430  AST 30  ALT 20  ALKPHOS 67  BILITOT 0.6  PROT 7.2  ALBUMIN 3.8   Recent Labs  Lab 04/16/18 1430  LIPASE 25   No results for input(s): AMMONIA in the last 168 hours. Coagulation profile No results for input(s): INR, PROTIME in the last 168 hours.  CBC: Recent Labs  Lab 04/16/18 1430 04/17/18  0507  WBC 7.2 6.3  HGB 11.2* 10.7*  HCT 36.8 34.8*  MCV 96.1 96.7  PLT 379 354   Cardiac Enzymes: Recent Labs  Lab 04/16/18 2040 04/17/18 0507 04/17/18 0916  TROPONINI <0.03 <0.03 <0.03   BNP (last 3 results) No results for input(s): PROBNP in the last 8760 hours. CBG: No results for input(s): GLUCAP in the last 168 hours. D-Dimer: No results for input(s): DDIMER in the last 72 hours. Hgb A1c: No results for input(s): HGBA1C in the last 72 hours. Lipid Profile: No results for input(s): CHOL, HDL, LDLCALC, TRIG, CHOLHDL, LDLDIRECT in the last 72  hours. Thyroid function studies: No results for input(s): TSH, T4TOTAL, T3FREE, THYROIDAB in the last 72 hours.  Invalid input(s): FREET3 Anemia work up: No results for input(s): VITAMINB12, FOLATE, FERRITIN, TIBC, IRON, RETICCTPCT in the last 72 hours. Sepsis Labs: Recent Labs  Lab 04/16/18 1430 04/16/18 1736 04/16/18 1751 04/17/18 0507  WBC 7.2  --   --  6.3  LATICACIDVEN  --  1.41 0.75  --     Microbiology Recent Results (from the past 240 hour(s))  MRSA PCR Screening     Status: Abnormal   Collection Time: 04/17/18  1:31 AM  Result Value Ref Range Status   MRSA by PCR POSITIVE (A) NEGATIVE Final    Comment:        The GeneXpert MRSA Assay (FDA approved for NASAL specimens only), is one component of a comprehensive MRSA colonization surveillance program. It is not intended to diagnose MRSA infection nor to guide or monitor treatment for MRSA infections. RESULT CALLED TO, READ BACK BY AND VERIFIED WITH: COBLE,K RN 04/17/18 AT 0542 SKEEN,P Performed at [facility], [place] [address], [place], [place] [address]     Procedures and diagnostic studies:  Dg Chest 2 View  Result Date: 04/16/2018 CLINICAL DATA:  Upper abdominal and lower chest pain radiating to the back with vomiting. EXAM: CHEST - 2 VIEW COMPARISON:  CT 02/23/2018 and CXR 02/20/2018 FINDINGS: The heart size and mediastinal contours are within normal limits. Nonaneurysmal atherosclerotic aorta. Emphysematous hyperinflation of the lungs with clearing of posterior pleural effusions since prior exam. Nodular densities compatible with nipple shadows are seen bilaterally overlying the mid lung, best seen on the frontal view. These can be confirmed with nipple markers and repeat imaging. Superimposed small moderate-sized hiatal hernia. Osteopenic appearance of the bony thorax without acute nor suspicious osseous appearing abnormalities. IMPRESSION: 1. Emphysematous hyperinflation of the lungs compatible COPD. No  active pulmonary disease. 2. Small to moderate-sized hiatal hernia. 3. Clearing of bilateral posterior pleural effusions since prior. 4. Nodular densities seen at the same level bilaterally overlying the mid lung compatible with nipple shadows. These can be confirmed with repeat imaging and nipple markers in place to exclude pulmonary nodules. Electronically Signed   By: Ashley Royalty M.D.   On: 04/16/2018 15:57   Dg Esophagus  Result Date: 04/17/2018 CLINICAL DATA:  Regurgitation of food, epigastric pain. EXAM: ESOPHOGRAM/BARIUM SWALLOW TECHNIQUE: Single contrast examination was performed using  thin barium. FLUOROSCOPY TIME:  Fluoroscopy Time:  2 minutes 16 seconds Radiation Exposure Index (if provided by the fluoroscopic device): Number of Acquired Spot Images: 0 COMPARISON:  None FINDINGS: Fluoroscopic evaluation of swallowing demonstrates disruption of primary esophageal peristaltic waves with stasis. 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 There is stasis of contrast within the diverticulum. Large hiatal hernia noted. No fixed esophageal stricture. IMPRESSION: Large wide-mouth diverticulum  off the mid to distal thoracic esophagus with stasis of contrast within the diverticulum. Large hiatal hernia with stasis of contrast. 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 Electronically Signed   By: Rolm Baptise M.D.   On: 04/17/2018 10:45    Medications:   . aspirin  300 mg Rectal Daily  . Chlorhexidine Gluconate Cloth  6 each Topical Q0600  . fluticasone furoate-vilanterol  1 puff Inhalation Daily   And  . umeclidinium bromide  1 puff Inhalation Daily  . levalbuterol  1.25 mg Nebulization Q6H  . metoprolol tartrate  5 mg Intravenous Q6H  . mupirocin ointment  1 application Nasal BID  . sodium chloride flush  3 mL Intravenous Q12H   Continuous Infusions: . famotidine (PEPCID) IV Stopped (04/16/18 2140)  .  lactated ringers 100 mL/hr at 04/16/18 2104     LOS: 0 days   Geradine Girt  [facility]   *Please refer to [email or web address], password [email or web address] to get updated schedule on who will round on this patient, as hospitalists switch teams weekly. If 7PM-7AM, please contact night-coverage at [email or web address], password [email or web address] for any overnight needs.  04/17/2018, 2:42 PM

## 2018-04-18 ENCOUNTER — Telehealth: Payer: Self-pay

## 2018-04-18 ENCOUNTER — Encounter (HOSPITAL_COMMUNITY)
Admission: EM | Disposition: A | Discharge status: Home Health | Payer: Self-pay | Source: Home / Self Care | Attending: Internal Medicine

## 2018-04-18 ENCOUNTER — Inpatient Hospital Stay (HOSPITAL_COMMUNITY): Payer: Medicare Other | Admitting: Anesthesiology

## 2018-04-18 ENCOUNTER — Encounter (HOSPITAL_COMMUNITY): Payer: Self-pay | Admitting: Anesthesiology

## 2018-04-18 ENCOUNTER — Inpatient Hospital Stay (HOSPITAL_COMMUNITY): Payer: Medicare Other

## 2018-04-18 DIAGNOSIS — K228 Other specified diseases of esophagus: Secondary | ICD-10-CM

## 2018-04-18 DIAGNOSIS — T18128A Food in esophagus causing other injury, initial encounter: Secondary | ICD-10-CM

## 2018-04-18 DIAGNOSIS — R933 Abnormal findings on diagnostic imaging of other parts of digestive tract: Secondary | ICD-10-CM

## 2018-04-18 DIAGNOSIS — W44F3XA Food entering into or through a natural orifice, initial encounter: Secondary | ICD-10-CM

## 2018-04-18 HISTORY — PX: ESOPHAGOGASTRODUODENOSCOPY: SHX5428

## 2018-04-18 SURGERY — EGD (ESOPHAGOGASTRODUODENOSCOPY)
Anesthesia: Monitor Anesthesia Care

## 2018-04-18 MED ORDER — ASPIRIN EC 81 MG PO TBEC
81.0000 mg | DELAYED_RELEASE_TABLET | Freq: Every day | ORAL | Status: DC
  Administered 2018-04-19 – 2018-04-21 (×3): 81 mg via ORAL
  Filled 2018-04-18 (×3): qty 1

## 2018-04-18 MED ORDER — LIDOCAINE 2% (20 MG/ML) 5 ML SYRINGE
INTRAMUSCULAR | Status: DC | PRN
  Administered 2018-04-18: 50 mg via INTRAVENOUS

## 2018-04-18 MED ORDER — ENSURE ENLIVE PO LIQD
237.0000 mL | Freq: Four times a day (QID) | ORAL | Status: DC
  Administered 2018-04-18 – 2018-04-21 (×12): 237 mL via ORAL

## 2018-04-18 MED ORDER — DILTIAZEM HCL ER COATED BEADS 240 MG PO CP24
240.0000 mg | ORAL_CAPSULE | Freq: Every day | ORAL | Status: DC
  Administered 2018-04-18 – 2018-04-21 (×4): 240 mg via ORAL
  Filled 2018-04-18 (×4): qty 1

## 2018-04-18 MED ORDER — AZTREONAM 1 G IJ SOLR
1.0000 g | Freq: Three times a day (TID) | INTRAMUSCULAR | Status: DC
  Filled 2018-04-18 (×2): qty 1

## 2018-04-18 MED ORDER — ALPRAZOLAM 0.25 MG PO TABS
0.2500 mg | ORAL_TABLET | Freq: Two times a day (BID) | ORAL | Status: DC | PRN
  Administered 2018-04-18 – 2018-04-21 (×4): 0.25 mg via ORAL
  Filled 2018-04-18 (×4): qty 1

## 2018-04-18 MED ORDER — METOPROLOL TARTRATE 12.5 MG HALF TABLET
12.5000 mg | ORAL_TABLET | Freq: Two times a day (BID) | ORAL | Status: DC
  Administered 2018-04-18 – 2018-04-21 (×6): 12.5 mg via ORAL
  Filled 2018-04-18 (×6): qty 1

## 2018-04-18 MED ORDER — SERTRALINE HCL 50 MG PO TABS
50.0000 mg | ORAL_TABLET | Freq: Every day | ORAL | Status: DC
  Administered 2018-04-19 – 2018-04-21 (×3): 50 mg via ORAL
  Filled 2018-04-18 (×3): qty 1

## 2018-04-18 MED ORDER — PANTOPRAZOLE SODIUM 40 MG PO TBEC
40.0000 mg | DELAYED_RELEASE_TABLET | Freq: Every day | ORAL | Status: DC
  Administered 2018-04-18 – 2018-04-21 (×4): 40 mg via ORAL
  Filled 2018-04-18 (×4): qty 1

## 2018-04-18 MED ORDER — BUTALBITAL-APAP-CAFFEINE 50-325-40 MG PO TABS: 1.0000 | ORAL_TABLET | Freq: Two times a day (BID) | ORAL | Status: DC | PRN

## 2018-04-18 MED ORDER — LACTATED RINGERS IV SOLN
INTRAVENOUS | Status: AC | PRN
  Administered 2018-04-18: 1000 mL via INTRAVENOUS

## 2018-04-18 MED ORDER — PREDNISONE 20 MG PO TABS
20.0000 mg | ORAL_TABLET | Freq: Every day | ORAL | Status: DC
  Administered 2018-04-19 – 2018-04-21 (×3): 20 mg via ORAL
  Filled 2018-04-18 (×3): qty 1

## 2018-04-18 MED ORDER — LEVALBUTEROL HCL 1.25 MG/0.5ML IN NEBU: 1.2500 mg | INHALATION_SOLUTION | Freq: Four times a day (QID) | RESPIRATORY_TRACT | Status: DC | PRN

## 2018-04-18 MED ORDER — HYDROCODONE-ACETAMINOPHEN 5-325 MG PO TABS
1.0000 | ORAL_TABLET | Freq: Four times a day (QID) | ORAL | Status: DC | PRN
  Administered 2018-04-18 – 2018-04-21 (×8): 1 via ORAL
  Filled 2018-04-18 (×8): qty 1

## 2018-04-18 MED ORDER — VANCOMYCIN HCL IN DEXTROSE 1-5 GM/200ML-% IV SOLN
1000.0000 mg | INTRAVENOUS | Status: DC
  Administered 2018-04-18 – 2018-04-20 (×3): 1000 mg via INTRAVENOUS
  Filled 2018-04-18 (×4): qty 200

## 2018-04-18 MED ORDER — SODIUM CHLORIDE 0.9 % IV SOLN
2.0000 g | INTRAVENOUS | Status: DC
  Administered 2018-04-18 – 2018-04-20 (×3): 2 g via INTRAVENOUS
  Filled 2018-04-18 (×4): qty 2

## 2018-04-18 MED ORDER — LIDOCAINE 5 % EX PTCH
1.0000 | MEDICATED_PATCH | CUTANEOUS | Status: DC
  Administered 2018-04-18 – 2018-04-21 (×4): 1 via TRANSDERMAL
  Filled 2018-04-18 (×4): qty 1

## 2018-04-18 MED ORDER — PROPOFOL 10 MG/ML IV BOLUS
INTRAVENOUS | Status: DC | PRN
  Administered 2018-04-18: 40 mg via INTRAVENOUS
  Administered 2018-04-18: 10 mg via INTRAVENOUS
  Administered 2018-04-18 (×2): 20 mg via INTRAVENOUS
  Administered 2018-04-18: 10 mg via INTRAVENOUS

## 2018-04-18 NOTE — Telephone Encounter (Signed)
-----   Message from Vena Rua, PA-C sent at 04/18/2018  2:18 PM EDT ----- Regarding: arranging mano study [place].  Dr Fuller Plan sent this to me. Pt needs manometry, indication is dysphagia, rule out achalasia.   I know you are more familiar with arranging outpt studies so will you be able to arrange this as well as the outpt follow up?  Let me know, if you get dates set before pt discharges, I can let her know.   Thank You, Judson Roch.    Would you be able to arrange thi ----- Message ----- From: Ladene Artist, MD Sent: 04/18/2018  12:55 PM To: Vena Rua, PA-C  Please arrange outpatient esophageal manometry at [facility] read by VN. GI office appt about 2 weeks after esophageal mano with me or APP.

## 2018-04-18 NOTE — Telephone Encounter (Signed)
Ria Comment [facility], [phone] ext [phone].  States she is re-faxing now the plan of care form with start date of 05/01.

## 2018-04-18 NOTE — Anesthesia Preprocedure Evaluation (Signed)
Anesthesia Evaluation  Patient identified by MRN, date of birth, ID band Patient awake    Reviewed: Allergy & Precautions, NPO status , Patient's Chart, lab work & pertinent test results, reviewed documented beta blocker date and time   History of Anesthesia Complications Negative for: history of anesthetic complications  Airway Mallampati: II  TM Distance: >3 FB Neck ROM: Full    Dental  (+) Edentulous Upper, Edentulous Lower   Pulmonary sleep apnea and Continuous Positive Airway Pressure Ventilation , COPD,  COPD inhaler and oxygen dependent, former smoker (quit 2001),  S/p trach, now closed, for COPD   breath sounds clear to auscultation       Cardiovascular hypertension, Pt. on medications and Pt. on home beta blockers  Rhythm:Regular Rate:Normal  4/19 ECHO: EF 60-65%, valves OK   Neuro/Psych Anxiety Depression    GI/Hepatic Neg liver ROS, GERD  Medicated and Controlled,  Endo/Other  negative endocrine ROS  Renal/GU negative Renal ROS     Musculoskeletal  (+) Fibromyalgia -  Abdominal   Peds  Hematology Hb 10.7   Anesthesia Other Findings   Reproductive/Obstetrics                             Anesthesia Physical Anesthesia Plan  ASA: III  Anesthesia Plan: MAC   Post-op Pain Management:    Induction:   PONV Risk Score and Plan: 2 and Treatment may vary due to age or medical condition  Airway Management Planned: Natural Airway and Nasal Cannula  Additional Equipment:   Intra-op Plan:   Post-operative Plan:   Informed Consent: I have reviewed the patients History and Physical, chart, labs and discussed the procedure including the risks, benefits and alternatives for the proposed anesthesia with the patient or authorized representative who has indicated his/her understanding and acceptance.     Plan Discussed with:   Anesthesia Plan Comments: (Plan routine monitors, MAC)         Anesthesia Quick Evaluation

## 2018-04-18 NOTE — Telephone Encounter (Signed)
Will hold until form is received.

## 2018-04-18 NOTE — Progress Notes (Signed)
Progress Note    Megan [name]  ENI:[id number] DOB: Feb 12, 1951  DOA: 04/16/2018 PCP: [name], MD    Brief Narrative:   Medical records reviewed and are as summarized below:  Megan [name] is an 67 y.o. female PMH COPD, chronic hypoxic respiratory failure on oxygen, essential hypertension, esophageal problems requiring several episodes of dilatation, presented to the emergency department with 2-3-day history of increasing chest and abdominal pain after developing difficulty swallowing.  Has been unable to eat or drink anything for the last 2 days secondary to severe pain and some regurgitation.  Symptoms similar to previous esophageal problems.  Pain described as severe, pressure, improved with nitroglycerin in the emergency department, associated with some spitting up without frank vomiting.    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 Large hiatal hernia with stasis of contrast. 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 -s/p EGD:  A non-bleeding diverticulum with a large opening and no stigmata of recent bleeding was found in the mid esophagus.  Food was found in the distal esophagus. It was gently advanced into the stomach under direct visualization.The lumen of the esophagus was moderately dilated.  - Esophageal motility disorder and esophageal food impaction contributing to dysphagia. - Full liquid diet today. Advance to soft diet as tolerated. Avoid foods that causes difficulty  swallowing.  -  Antireflux measures.  - Perform routine esophageal manometry as  outpatient at appointment to be scheduled. - Protonix 40 mg po qam.   - GI follow up  after manometry reading.    COPD, chronic hypoxic respiratory failure on home oxygen --Appears stable at this point.  Continue bronchodilators, oxygen.  Essential hypertension --resume home meds  GERD --PPI    Family Communication/Anticipated D/C date and plan/Code Status   DVT prophylaxis: scd Code Status: Full Code.  Family Communication:  Disposition Plan: advance diet as tolerated   Medical Consultants:    GI   Subjective:   C/o left chest wall pain  Objective:    Vitals:   04/18/18 1312 04/18/18 1315 04/18/18 1316 04/18/18 1349  BP: (!) 178/89  (!) 197/78 (!) 156/95  Pulse: 93  88 87  Resp:   17 17  Temp:    99.6 F (37.6 C)  TempSrc: Oral   Oral  SpO2: (!) 88% 93% 94% 96%  Weight:      Height:        Intake/Output Summary (Last 24 hours) at 04/18/2018 1433 Last data filed at 04/18/2018 1238 Gross per 24 hour  Intake 800 ml  Output -  Net 800 ml   Filed Weights   04/16/18 1425 04/16/18 1959 04/18/18 1149  Weight: 40.8 kg (90 lb) 39.8 kg (87 lb 11.2 oz) 39.5 kg (87 lb)    Exam: In bed, NAD No rash on left side of chest Mood/affect normal   Data Reviewed:   I have personally reviewed following labs and imaging studies:  Labs: Labs show the following:   Basic Metabolic Panel: Recent Labs  Lab 04/16/18 1430 04/17/18 0507  NA 138  139  K 4.8 4.1  CL 93* 96*  CO2 35* 36*  GLUCOSE 135* 71  BUN 9 9  CREATININE 0.68 0.67  CALCIUM 9.4 8.7*   GFR Estimated Creatinine Clearance: 43.7 mL/min (by C-G formula based on SCr of 0.67 mg/dL). Liver Function Tests: Recent Labs  Lab 04/16/18 1430  AST 30  ALT 20  ALKPHOS 67  BILITOT 0.6  PROT 7.2  ALBUMIN 3.8   Recent Labs  Lab 04/16/18 1430  LIPASE 25   No results for input(s): AMMONIA in the last 168 hours. Coagulation  profile No results for input(s): INR, PROTIME in the last 168 hours.  CBC: Recent Labs  Lab 04/16/18 1430 04/17/18 0507  WBC 7.2 6.3  HGB 11.2* 10.7*  HCT 36.8 34.8*  MCV 96.1 96.7  PLT 379 354   Cardiac Enzymes: Recent Labs  Lab 04/16/18 2040 04/17/18 0507 04/17/18 0916  TROPONINI <0.03 <0.03 <0.03   BNP (last 3 results) No results for input(s): PROBNP in the last 8760 hours. CBG: No results for input(s): GLUCAP in the last 168 hours. D-Dimer: No results for input(s): DDIMER in the last 72 hours. Hgb A1c: No results for input(s): HGBA1C in the last 72 hours. Lipid Profile: No results for input(s): CHOL, HDL, LDLCALC, TRIG, CHOLHDL, LDLDIRECT in the last 72 hours. Thyroid function studies: No results for input(s): TSH, T4TOTAL, T3FREE, THYROIDAB in the last 72 hours.  Invalid input(s): FREET3 Anemia work up: No results for input(s): VITAMINB12, FOLATE, FERRITIN, TIBC, IRON, RETICCTPCT in the last 72 hours. Sepsis Labs: Recent Labs  Lab 04/16/18 1430 04/16/18 1736 04/16/18 1751 04/17/18 0507  WBC 7.2  --   --  6.3  LATICACIDVEN  --  1.41 0.75  --     Microbiology Recent Results (from the past 240 hour(s))  MRSA PCR Screening     Status: Abnormal   Collection Time: 04/17/18  1:31 AM  Result Value Ref Range Status   MRSA by PCR POSITIVE (A) NEGATIVE Final    Comment:        The GeneXpert MRSA Assay (FDA approved for NASAL specimens only), is one component of a comprehensive MRSA colonization surveillance program. It is not intended to diagnose MRSA infection nor to guide or monitor treatment for MRSA infections. RESULT CALLED TO, READ BACK BY AND VERIFIED WITH: COBLE,K RN 04/17/18 AT 0542 SKEEN,P Performed at [facility], [place] [address], [place], [place] [address]     Procedures and diagnostic studies:  Dg Chest 2 View  Result Date: 04/16/2018 CLINICAL DATA:  Upper abdominal and lower chest pain radiating to the back with vomiting. EXAM:  CHEST - 2 VIEW COMPARISON:  CT 02/23/2018 and CXR 02/20/2018 FINDINGS: The heart size and mediastinal contours are within normal limits. Nonaneurysmal atherosclerotic aorta. Emphysematous hyperinflation of the lungs with clearing of posterior pleural effusions since prior exam. Nodular densities compatible with nipple shadows are seen bilaterally overlying the mid lung, best seen on the frontal view. These can be confirmed with nipple markers and repeat imaging. Superimposed small moderate-sized hiatal hernia. Osteopenic appearance of the bony thorax without acute nor suspicious osseous appearing abnormalities. IMPRESSION: 1. Emphysematous hyperinflation of the lungs compatible COPD. No active pulmonary disease. 2. Small to moderate-sized hiatal hernia. 3. Clearing of bilateral posterior pleural effusions since prior. 4. Nodular densities seen at the same level bilaterally overlying the mid lung compatible with nipple shadows. These can be confirmed with repeat imaging and nipple markers in place to exclude pulmonary nodules. Electronically  Signed   By: Ashley Royalty M.D.   On: 04/16/2018 15:57   Dg Esophagus  Result Date: 04/17/2018 CLINICAL DATA:  Regurgitation of food, epigastric pain. EXAM: ESOPHOGRAM/BARIUM SWALLOW TECHNIQUE: Single contrast examination was performed using  thin barium. FLUOROSCOPY TIME:  Fluoroscopy Time:  2 minutes 16 seconds Radiation Exposure Index (if provided by the fluoroscopic device): Number of Acquired Spot Images: 0 COMPARISON:  None FINDINGS: Fluoroscopic evaluation of swallowing demonstrates disruption of primary esophageal peristaltic waves with stasis. 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 There is stasis of contrast within the diverticulum. Large hiatal hernia noted. No fixed esophageal stricture. 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  Large hiatal hernia with stasis of contrast. 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 Electronically Signed   By: Rolm Baptise M.D.   On: 04/17/2018 10:45    Medications:   . aspirin EC  81 mg Oral Daily  . Chlorhexidine Gluconate Cloth  6 each Topical Q0600  . diltiazem  240 mg Oral Daily  . feeding supplement (ENSURE ENLIVE)  237 mL Oral QID  . fluticasone furoate-vilanterol  1 puff Inhalation Daily   And  . umeclidinium bromide  1 puff Inhalation Daily  . lidocaine  1 patch Transdermal Q24H  . metoprolol tartrate  12.5 mg Oral BID  . mupirocin ointment  1 application Nasal BID  . pantoprazole  40 mg Oral Daily  . [START ON 04/19/2018] predniSONE  20 mg Oral Q breakfast  . sertraline  50 mg Oral Daily  . sodium chloride flush  3 mL Intravenous Q12H   Continuous Infusions:    LOS: 1 day   Geradine Girt  [facility]   *Please refer to [email or web address], password [email or web address] to get updated schedule on who will round on this patient, as hospitalists switch teams weekly. If 7PM-7AM, please contact night-coverage at [email or web address], password [email or web address] for any overnight needs.  04/18/2018, 2:33 PM

## 2018-04-18 NOTE — Interval H&P Note (Signed)
History and Physical Interval Note:  04/18/2018 12:22 PM  Megan [name]  has presented today for surgery, with the diagnosis of dysphagia, odynophagia  The various methods of treatment have been discussed with the patient and family. After consideration of risks, benefits and other options for treatment, the patient has consented to  Procedure(s): ESOPHAGOGASTRODUODENOSCOPY (EGD) (N/A) as a surgical intervention .  The patient's history has been reviewed, patient examined, no change in status, stable for surgery.  I have reviewed the patient's chart and labs.  Questions were answered to the patient's satisfaction.     Pricilla Riffle. Fuller Plan

## 2018-04-18 NOTE — Op Note (Addendum)
[facility] Patient Name: Megan [name] Procedure Date : 04/18/2018 MRN: [id number] Attending MD: Ladene Artist , MD Date of Birth: [birth year] CSN: [id number] Age: 67 Admit Type: Inpatient Procedure:                Upper GI endoscopy Indications:              Dysphagia, Abnormal esophagram, Epigastric pain Providers:                Pricilla Riffle. Fuller Plan, MD, Isidore Moos, Technician Referring MD:             [facility] Medicines:                Monitored Anesthesia Care Complications:            No immediate complications. Estimated Blood Loss:     Estimated blood loss: none. Procedure:                Pre-Anesthesia Assessment:                           - Prior to the procedure, a History and Physical                            was performed, and patient medications and                            allergies were reviewed. The patient's tolerance of                            previous anesthesia was also reviewed. The risks                            and benefits of the procedure and the sedation                            options and risks were discussed with the patient.                            All questions were answered, and informed consent                            was obtained. Prior Anticoagulants: The patient has                            taken no previous anticoagulant or antiplatelet                            agents. ASA Grade Assessment: III - A patient with                            severe systemic disease. After reviewing the risks  and benefits, the patient was deemed in                            satisfactory condition to undergo the procedure.                           After obtaining informed consent, the endoscope was                            passed under direct vision. Throughout the                            procedure, the patient's blood pressure, pulse, and                      oxygen saturations were monitored continuously. The                            [id number] ([id number]) scope was introduced through the                            mouth, and advanced to the second part of duodenum.                            The upper GI endoscopy was accomplished without                            difficulty. The patient tolerated the procedure                            well. Scope In: Scope Out: Findings:      A non-bleeding diverticulum with a large opening and no stigmata of       recent bleeding was found in the mid esophagus.      Food was found in the distal esophagus. It was gently advanced into the       stomach under direct visualization.      The lumen of the esophagus was moderately dilated.      The exam of the esophagus was otherwise normal.      A small hiatal hernia was present.      A small amount of food (residue) was found in the gastric fundus that       partially limited the exam.      The exam of the stomach was otherwise normal.      The duodenal bulb and second portion of the duodenum were normal. 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                           - Food in the distal esophagus.                           - Dilation in the entire esophagus.                           -  Small hiatal hernia.                           - A small amount of food (residue) in the stomach.                           - Normal duodenal bulb and second portion of the                            duodenum.                           - No specimens collected. Recommendation:           - Esophageal motility disorder and esophageal food                            impaction contributing to dysphagia.                           - Return patient to hospital ward for ongoing care.                           - Full liquid diet today. Advance to soft diet as                            tolerated. Avoid foods that causes difficulty                             swallowing.                           - Antireflux measures.                           - Perform routine esophageal manometry as                            outpatient at appointment to be scheduled.                           - Protonix 40 mg po qam.                           - OK for discharge from GI standpoint. GI follow up                            after manometry reading. Procedure Code(s):        --- Professional ---                           [phone], Esophagogastroduodenoscopy, flexible,                            transoral; diagnostic, including collection of  specimen(s) by brushing or washing, when performed                            (separate procedure) Diagnosis Code(s):        --- Professional ---                           Q39.6, Congenital diverticulum of esophagus                           T18.128A, Food in esophagus causing other injury,                            initial encounter                           K22.8, Other specified diseases of esophagus                           K44.9, Diaphragmatic hernia without obstruction or                            gangrene                           R13.10, Dysphagia, unspecified                           R93.3, Abnormal findings on diagnostic imaging of                            other parts of digestive tract CPT copyright 2017 American [employer]. All rights reserved. The codes documented in this report are preliminary and upon coder review may  be revised to meet current compliance requirements. Ladene Artist, MD 04/18/2018 12:53:05 PM This report has been signed electronically. Number of Addenda: 0

## 2018-04-18 NOTE — Anesthesia Postprocedure Evaluation (Signed)
Anesthesia Post Note  Patient: Megan [name]  Procedure(s) Performed: ESOPHAGOGASTRODUODENOSCOPY (EGD) (N/A )     Patient location during evaluation: Endoscopy Anesthesia Type: MAC Level of consciousness: awake and alert, oriented and patient cooperative Pain management: pain level controlled Vital Signs Assessment: post-procedure vital signs reviewed and stable Respiratory status: spontaneous breathing, nonlabored ventilation, respiratory function stable and patient connected to nasal cannula oxygen Cardiovascular status: blood pressure returned to baseline and stable Postop Assessment: no apparent nausea or vomiting Anesthetic complications: no    Last Vitals:  Vitals:   04/18/18 1315 04/18/18 1316  BP:  (!) 197/78  Pulse:  88  Resp:  17  Temp:    SpO2: 93% 94%    Last Pain:  Vitals:   04/18/18 1312  TempSrc: Oral  PainSc:                  Keivon Garden,E. Derl Abalos

## 2018-04-18 NOTE — Telephone Encounter (Signed)
I have arranged esophageal mano for 05/01/18 at [facility] 10:15 arrival.  I will mail her instructions.  Follow up with Nicoletta Ba PA on 05/24/18 10:00.  I will send her this in the mail as well.    If you will please notify her of the appointment dates and times.

## 2018-04-18 NOTE — Transfer of Care (Signed)
Immediate Anesthesia Transfer of Care Note  Patient: Megan [name]  Procedure(s) Performed: ESOPHAGOGASTRODUODENOSCOPY (EGD) (N/A )  Patient Location: PACU and Endoscopy Unit  Anesthesia Type:MAC  Level of Consciousness: awake, alert , oriented and patient cooperative  Airway & Oxygen Therapy: Patient Spontanous Breathing and Patient connected to nasal cannula oxygen  Post-op Assessment: Report given to RN and Post -op Vital signs reviewed and stable  Post vital signs: Reviewed and stable  Last Vitals:  Vitals Value Taken Time  BP 167/78 04/18/2018 12:45 PM  Temp    Pulse 93 04/18/2018 12:47 PM  Resp 23 04/18/2018 12:47 PM  SpO2 98 % 04/18/2018 12:47 PM  Vitals shown include unvalidated device data.  Last Pain:  Vitals:   04/18/18 1245  TempSrc:   PainSc: 5       Patients Stated Pain Goal: 2 (40/34/74 2595)  Complications: No apparent anesthesia complications

## 2018-04-18 NOTE — Anesthesia Procedure Notes (Signed)
Procedure Name: MAC Date/Time: 04/18/2018 12:35 PM Performed by: Renato Shin, CRNA Pre-anesthesia Checklist: Patient identified, Emergency Drugs available, Suction available and Patient being monitored Patient Re-evaluated:Patient Re-evaluated prior to induction Oxygen Delivery Method: Nasal cannula Preoxygenation: Pre-oxygenation with 100% oxygen Induction Type: IV induction Placement Confirmation: positive ETCO2,  CO2 detector and breath sounds checked- equal and bilateral Dental Injury: Teeth and Oropharynx as per pre-operative assessment

## 2018-04-18 NOTE — Progress Notes (Signed)
Pharmacy Antibiotic Note  Megan [name] is a 67 y.o. female admitted on 04/16/2018 with chest and abdominal pain after developing difficulty swallowing.  Pharmacy has been consulted for vancomycin dosing for PNA; also ordered aztreonam for PCN allergy. Per [facility] review, she has tolerated cefepime in the past. Spoke with K. Schorr and ok to change to cefepime. Renal function is normal. She is afebrile and WBC are normal. CXR with possible PNA.  Plan: Vancomycin 1000 mg IV every 24 hours.  Goal trough 15-20 mcg/mL.  Cefepime 2 g IV q24h Monitor renal function, clinical progress, and C/S Vancomycin trough at steady-state with low weight  Height: 5\' 3"  (160 cm) Weight: 87 lb (39.5 kg) IBW/kg (Calculated) : 52.4  Temp (24hrs), Avg:98.2 F (36.8 C), Min:97.5 F (36.4 C), Max:99.6 F (37.6 C)  Recent Labs  Lab 04/16/18 1430 04/16/18 1736 04/16/18 1751 04/17/18 0507  WBC 7.2  --   --  6.3  CREATININE 0.68  --   --  0.67  LATICACIDVEN  --  1.41 0.75  --     Estimated Creatinine Clearance: 43.7 mL/min (by C-G formula based on SCr of 0.67 mg/dL).    Allergies  Allergen Reactions  . Penicillins Rash and Hives    Has patient had a PCN reaction causing immediate rash, facial/tongue/throat swelling, SOB or lightheadedness with hypotension: No Has patient had a PCN reaction causing severe rash involving mucus membranes or skin necrosis:NO Has patient had a PCN reaction that required hospitalization No Has patient had a PCN reaction occurring within the last 10 years:NO If all of the above answers are "NO", then may proceed with Cephalosporin use.  . Fentanyl And Related Other (See Comments)    Behavioral changes per daughter    Antimicrobials this admission: vancomycin 6/18 >>  cefepime 6/18 >>   Dose adjustments this admission:   Microbiology results: 6/18 BCx:  6/18 Sputum:   6/17 MRSA PCR: neg  Thank you for allowing pharmacy to be a part of this patient's care.  Renold Genta, PharmD, BCPS Clinical Pharmacist Clinical phone for 04/18/2018 until 10p is [phone] Please check AMION for all [employer] numbers 04/18/2018 7:29 PM

## 2018-04-19 ENCOUNTER — Encounter (HOSPITAL_COMMUNITY): Payer: Self-pay | Admitting: Gastroenterology

## 2018-04-19 DIAGNOSIS — J181 Lobar pneumonia, unspecified organism: Secondary | ICD-10-CM

## 2018-04-19 DIAGNOSIS — T18128S Food in esophagus causing other injury, sequela: Secondary | ICD-10-CM

## 2018-04-19 LAB — BASIC METABOLIC PANEL
ANION GAP: 11 (ref 5–15)
BUN: 11 mg/dL (ref 6–20)
CALCIUM: 9.2 mg/dL (ref 8.9–10.3)
CO2: 36 mmol/L — ABNORMAL HIGH (ref 22–32)
CREATININE: 0.63 mg/dL (ref 0.44–1.00)
Chloride: 91 mmol/L — ABNORMAL LOW (ref 101–111)
GFR calc Af Amer: >60 mL/min (ref >60)
GFR calc non Af Amer: >60 mL/min (ref >60)
GLUCOSE: 134 mg/dL — AB (ref 65–99)
Potassium: 3.4 mmol/L — ABNORMAL LOW (ref 3.5–5.1)
Sodium: 138 mmol/L (ref 135–145)

## 2018-04-19 LAB — CBC
HCT: 34 % — ABNORMAL LOW (ref 36.0–46.0)
Hemoglobin: 11 g/dL — ABNORMAL LOW (ref 12.0–15.0)
MCH: 29.8 pg (ref 26.0–34.0)
MCHC: 32.4 g/dL (ref 30.0–36.0)
MCV: 92.1 fL (ref 78.0–100.0)
Platelets: 314 10*3/uL (ref 150–400)
RBC: 3.69 MIL/uL — ABNORMAL LOW (ref 3.87–5.11)
RDW: 13.4 % (ref 11.5–15.5)
WBC: 11.2 10*3/uL — ABNORMAL HIGH (ref 4.0–10.5)

## 2018-04-19 MED ORDER — POLYETHYLENE GLYCOL 3350 17 G PO PACK
17.0000 g | PACK | Freq: Every day | ORAL | Status: DC
  Administered 2018-04-19 – 2018-04-21 (×3): 17 g via ORAL
  Filled 2018-04-19 (×3): qty 1

## 2018-04-19 MED ORDER — DOCUSATE SODIUM 100 MG PO CAPS: 100.0000 mg | ORAL_CAPSULE | Freq: Two times a day (BID) | ORAL | Status: DC | PRN

## 2018-04-19 NOTE — Progress Notes (Signed)
Progress Note    Megan [name]  JJO:[id number] DOB: 1951/10/15  DOA: 04/16/2018 PCP: [name], MD    Brief Narrative:   Medical records reviewed and are as summarized below:  Megan [name] is an 67 y.o. female PMH COPD, chronic hypoxic respiratory failure on oxygen, essential hypertension, esophageal problems requiring several episodes of dilatation, presented to the emergency department with 2-3-day history of increasing chest and abdominal pain after developing difficulty swallowing.  Has been unable to eat or drink anything for the last 2 days secondary to severe pain and some regurgitation.  Symptoms similar to previous esophageal problems.  Pain described as severe, pressure, improved with nitroglycerin in the emergency department, associated with some spitting up without frank vomiting.    Assessment/Plan:   Principal Problem:   Epigastric pain Active Problems:   Essential hypertension   Esophageal reflux   COPD (chronic obstructive pulmonary disease) (HCC)   Abdominal pain   Chest pain   Chronic respiratory failure with hypoxia (HCC)   Dysphagia   Odynophagia   Food impaction of esophagus   Abnormal esophagram  Left sided pneumonia -treat as hospital associated since has been hospitalized in last 3 months -trend WBC count and monitor for fever -suspect left sided chest wall pain with improve with treatment of pna -will need follow up xray to ensure clearing   Dysphagia -s/p esophagram with: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 Large hiatal hernia with stasis of contrast. 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 -s/p EGD:  A non-bleeding diverticulum with a large opening and no stigmata of recent bleeding was found in the mid esophagus.  Food was found in the distal esophagus. It was gently advanced into the stomach under direct  visualization.The lumen of the esophagus was moderately dilated.  - Esophageal motility disorder and esophageal food impaction contributing to dysphagia. - Full liquid diet today. Advance to soft diet as tolerated. Avoid foods that causes difficulty  swallowing.  - Antireflux measures.  - Perform routine esophageal manometry as  outpatient at appointment to be scheduled. - Protonix 40 mg po qam.   - GI follow up  after manometry reading.    COPD, chronic hypoxic respiratory failure on home oxygen --Appears stable at this point.  Continue bronchodilators, oxygen.  Essential hypertension --resume home meds  GERD --PPI    Family Communication/Anticipated D/C date and plan/Code Status   DVT prophylaxis: scd Code Status: Full Code.  Family Communication:  Disposition Plan: advance diet as tolerated to soft-- home in 24-48 hours   Medical Consultants:    GI   Subjective:   Still with some left sided chest pain  Objective:    Vitals:   04/18/18 2046 04/19/18 0518 04/19/18 0756 04/19/18 0828  BP: (!) 172/77 132/71  (!) 144/99  Pulse: 94 94  (!) 113  Resp: 19 18    Temp: 97.8 F (36.6 C) 98 F (36.7 C)    TempSrc: Oral Oral    SpO2: 90% 93% 92%   Weight:      Height:        Intake/Output Summary (Last 24 hours) at 04/19/2018 0844 Last data filed at 04/19/2018 0839 Gross per 24 hour  Intake 1103 ml  Output 1500 ml  Net -397 ml   Filed Weights   04/16/18 1425 04/16/18 1959 04/18/18 1149  Weight: 40.8 kg (90 lb) 39.8 kg (87 lb 11.2 oz) 39.5 kg (  87 lb)    Exam: Anxious appearing Wearing nasal canula rrr No increased work of breathing No LE edema   Data Reviewed:   I have personally reviewed following labs and imaging studies:  Labs: Labs show the following:   Basic Metabolic Panel: Recent Labs  Lab 04/16/18 1430 04/17/18 0507 04/19/18 0623  NA 138 139 138  K 4.8 4.1 3.4*  CL 93* 96* 91*  CO2 35* 36* 36*  GLUCOSE 135* 71 134*  BUN 9 9 11    CREATININE 0.68 0.67 0.63  CALCIUM 9.4 8.7* 9.2   GFR Estimated Creatinine Clearance: 43.7 mL/min (by C-G formula based on SCr of 0.63 mg/dL). Liver Function Tests: Recent Labs  Lab 04/16/18 1430  AST 30  ALT 20  ALKPHOS 67  BILITOT 0.6  PROT 7.2  ALBUMIN 3.8   Recent Labs  Lab 04/16/18 1430  LIPASE 25   No results for input(s): AMMONIA in the last 168 hours. Coagulation profile No results for input(s): INR, PROTIME in the last 168 hours.  CBC: Recent Labs  Lab 04/16/18 1430 04/17/18 0507 04/19/18 0623  WBC 7.2 6.3 11.2*  HGB 11.2* 10.7* 11.0*  HCT 36.8 34.8* 34.0*  MCV 96.1 96.7 92.1  PLT 379 354 314   Cardiac Enzymes: Recent Labs  Lab 04/16/18 2040 04/17/18 0507 04/17/18 0916  TROPONINI <0.03 <0.03 <0.03   BNP (last 3 results) No results for input(s): PROBNP in the last 8760 hours. CBG: No results for input(s): GLUCAP in the last 168 hours. D-Dimer: No results for input(s): DDIMER in the last 72 hours. Hgb A1c: No results for input(s): HGBA1C in the last 72 hours. Lipid Profile: No results for input(s): CHOL, HDL, LDLCALC, TRIG, CHOLHDL, LDLDIRECT in the last 72 hours. Thyroid function studies: No results for input(s): TSH, T4TOTAL, T3FREE, THYROIDAB in the last 72 hours.  Invalid input(s): FREET3 Anemia work up: No results for input(s): VITAMINB12, FOLATE, FERRITIN, TIBC, IRON, RETICCTPCT in the last 72 hours. Sepsis Labs: Recent Labs  Lab 04/16/18 1430 04/16/18 1736 04/16/18 1751 04/17/18 0507 04/19/18 0623  WBC 7.2  --   --  6.3 11.2*  LATICACIDVEN  --  1.41 0.75  --   --     Microbiology Recent Results (from the past 240 hour(s))  MRSA PCR Screening     Status: Abnormal   Collection Time: 04/17/18  1:31 AM  Result Value Ref Range Status   MRSA by PCR POSITIVE (A) NEGATIVE Final    Comment:        The GeneXpert MRSA Assay (FDA approved for NASAL specimens only), is one component of a comprehensive MRSA  colonization surveillance program. It is not intended to diagnose MRSA infection nor to guide or monitor treatment for MRSA infections. RESULT CALLED TO, READ BACK BY AND VERIFIED WITH: COBLE,K RN 04/17/18 AT 0542 SKEEN,P Performed at [facility], [place] [address], [place], [place] [address]     Procedures and diagnostic studies:  Dg Chest 2 View  Result Date: 04/18/2018 CLINICAL DATA:  Chest pain, fever. EXAM: CHEST - 2 VIEW COMPARISON:  Radiographs of April 16, 2018. FINDINGS: The heart size and mediastinal contours are within normal limits. No pneumothorax is noted. Hyperexpansion of the lungs is noted consistent with chronic obstructive pulmonary disease. No acute abnormality seen in the right lung. Mild left pleural effusion is noted with associated atelectasis or infiltrate. The visualized skeletal structures are unremarkable. IMPRESSION: Mild left basilar pneumonia or atelectasis is noted with associated pleural effusion. Followup  PA and lateral chest X-ray is recommended in 3-4 weeks following trial of antibiotic therapy to ensure resolution and exclude underlying malignancy. Hyperexpansion of the lungs is noted consistent with chronic obstructive pulmonary disease. Electronically Signed   By: Marijo Conception, M.D.   On: 04/18/2018 16:30   Dg Ribs Unilateral Left  Result Date: 04/18/2018 CLINICAL DATA:  Acute left rib pain.  Fever. EXAM: LEFT RIBS - 2 VIEW COMPARISON:  Radiographs of April 16, 2018. FINDINGS: No fracture or other bone lesions are seen involving the ribs. IMPRESSION: Normal left ribs. Electronically Signed   By: Marijo Conception, M.D.   On: 04/18/2018 16:28   Dg Esophagus  Result Date: 04/17/2018 CLINICAL DATA:  Regurgitation of food, epigastric pain. EXAM: ESOPHOGRAM/BARIUM SWALLOW TECHNIQUE: Single contrast examination was performed using  thin barium. FLUOROSCOPY TIME:  Fluoroscopy Time:  2 minutes 16 seconds Radiation Exposure Index (if provided by the  fluoroscopic device): Number of Acquired Spot Images: 0 COMPARISON:  None FINDINGS: Fluoroscopic evaluation of swallowing demonstrates disruption of primary esophageal peristaltic waves with stasis. 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 There is stasis of contrast within the diverticulum. Large hiatal hernia noted. No fixed esophageal stricture. 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 Large hiatal hernia with stasis of contrast. 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 Electronically Signed   By: Rolm Baptise M.D.   On: 04/17/2018 10:45    Medications:   . aspirin EC  81 mg Oral Daily  . Chlorhexidine Gluconate Cloth  6 each Topical Q0600  . diltiazem  240 mg Oral Daily  . feeding supplement (ENSURE ENLIVE)  237 mL Oral QID  . fluticasone furoate-vilanterol  1 puff Inhalation Daily   And  . umeclidinium bromide  1 puff Inhalation Daily  . lidocaine  1 patch Transdermal Q24H  . metoprolol tartrate  12.5 mg Oral BID  . mupirocin ointment  1 application Nasal BID  . pantoprazole  40 mg Oral Daily  . predniSONE  20 mg Oral Q breakfast  . sertraline  50 mg Oral Daily  . sodium chloride flush  3 mL Intravenous Q12H   Continuous Infusions: . ceFEPime (MAXIPIME) IV Stopped (04/18/18 2249)  . vancomycin Stopped (04/18/18 2323)     LOS: 2 days   Geradine Girt  [facility]   *Please refer to [email or web address], password [email or web address] to get updated schedule on who will round on this patient, as hospitalists switch teams weekly. If 7PM-7AM, please contact night-coverage at [email or web address], password [email or web address] for any overnight needs.  04/19/2018, 8:44 AM

## 2018-04-19 NOTE — Telephone Encounter (Signed)
Form has been received, and placed in "[place] sign" folder in RB's cubby.   Forwarding to [place] to follow up on.

## 2018-04-20 DIAGNOSIS — J189 Pneumonia, unspecified organism: Secondary | ICD-10-CM

## 2018-04-20 MED ORDER — SODIUM CHLORIDE 0.9 % IV SOLN: INTRAVENOUS | Status: DC | PRN

## 2018-04-20 NOTE — Progress Notes (Signed)
IV site was assessed and flushed prior to iv vanc being hung. Went back in to assess pt's iv site to find that it had infiltrated. Iv site to be removed and iv team consulted to start a new IV site on pt.

## 2018-04-20 NOTE — Progress Notes (Addendum)
Progress Note    Megan [name]  WJX:[id number] DOB: 19-May-1951  DOA: 04/16/2018 PCP: [name], MD    Brief Narrative:   Medical records reviewed and are as summarized below:  Megan [name] is an 67 y.o. female PMH COPD, chronic hypoxic respiratory failure on oxygen, essential hypertension, esophageal problems requiring several episodes of dilatation, presented to the emergency department with 2-3-day history of increasing chest and abdominal pain after developing difficulty swallowing.  Has been unable to eat or drink anything for the last 2 days secondary to severe pain and some regurgitation. Found to have food impaction that was cleared.  Then found to have a left sided PNA.  Slowly improving.  PT eval and home in AM on PO abx-- ? levaquin (not sure doxy is best choice with esophageal issues).   Assessment/Plan:   Principal Problem:   Epigastric pain Active Problems:   Essential hypertension   Esophageal reflux   COPD (chronic obstructive pulmonary disease) (HCC)   Abdominal pain   Chest pain   Chronic respiratory failure with hypoxia (HCC)   Dysphagia   Odynophagia   Food impaction of esophagus   Abnormal esophagram  Left sided pneumonia -treat as hospital associated since has been hospitalized in last 3 months -trend WBC count and monitor for fever -suspect left sided chest wall pain with improve with treatment of pna-- have added lidocaine patch -will need follow up xray to ensure clearing in 3-4 weeks   Abdominal pain due to food impaction/Dysphagia -s/p esophagram with: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 Large hiatal hernia with stasis of contrast. 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 -s/p EGD:  A non-bleeding diverticulum with a large opening and no stigmata of recent bleeding was found in the mid esophagus.  Food was found in the  distal esophagus. It was gently advanced into the stomach under direct visualization.The lumen of the esophagus was moderately dilated.  - Esophageal motility disorder and esophageal food impaction contributing to dysphagia. - Full liquid diet today. Advance to soft diet as tolerated. Avoid foods that causes difficulty  swallowing.  - Antireflux measures.  - Perform routine esophageal manometry as  outpatient at appointment to be scheduled. - Protonix 40 mg po qam.   - GI follow up  after manometry reading.    COPD, chronic hypoxic respiratory failure on home oxygen --Appears stable at this point.  Continue bronchodilators, oxygen.  Essential hypertension --resume home meds  GERD --PPI    Family Communication/Anticipated D/C date and plan/Code Status   DVT prophylaxis: scd Code Status: Full Code.  Family Communication:  Disposition Plan: advance diet as tolerated to soft-- home in AM?   Medical Consultants:    GI   Subjective:   Coughing up yellow mucous  Objective:    Vitals:   04/19/18 2127 04/20/18 0100 04/20/18 0436 04/20/18 0853  BP: 124/65 (!) 127/91 (!) 124/57   Pulse: 82 84 60   Resp: 17 16 17    Temp: 98.4 F (36.9 C) 97.8 F (36.6 C) 98.1 F (36.7 C)   TempSrc: Oral Oral Oral   SpO2: 95% 100% 97% 97%  Weight:      Height:        Intake/Output Summary (Last 24 hours) at 04/20/2018 1102 Last data filed at 04/20/2018 1003 Gross per 24 hour  Intake 660 ml  Output -  Net 660 ml   [employer]  04/16/18 1425 04/16/18 1959 04/18/18 1149  Weight: 40.8 kg (90 lb) 39.8 kg (87 lb 11.2 oz) 39.5 kg (87 lb)    Exam: More relaxed, breathing easier Diminished, not moving much air rrr   Data Reviewed:   I have personally reviewed following labs and imaging studies:  Labs: Labs show the following:   Basic Metabolic Panel: Recent Labs  Lab 04/16/18 1430 04/17/18 0507 04/19/18 0623  NA 138 139 138  K 4.8 4.1 3.4*  CL 93* 96* 91*  CO2  35* 36* 36*  GLUCOSE 135* 71 134*  BUN 9 9 11   CREATININE 0.68 0.67 0.63  CALCIUM 9.4 8.7* 9.2   GFR Estimated Creatinine Clearance: 43.7 mL/min (by C-G formula based on SCr of 0.63 mg/dL). Liver Function Tests: Recent Labs  Lab 04/16/18 1430  AST 30  ALT 20  ALKPHOS 67  BILITOT 0.6  PROT 7.2  ALBUMIN 3.8   Recent Labs  Lab 04/16/18 1430  LIPASE 25   No results for input(s): AMMONIA in the last 168 hours. Coagulation profile No results for input(s): INR, PROTIME in the last 168 hours.  CBC: Recent Labs  Lab 04/16/18 1430 04/17/18 0507 04/19/18 0623  WBC 7.2 6.3 11.2*  HGB 11.2* 10.7* 11.0*  HCT 36.8 34.8* 34.0*  MCV 96.1 96.7 92.1  PLT 379 354 314   Cardiac Enzymes: Recent Labs  Lab 04/16/18 2040 04/17/18 0507 04/17/18 0916  TROPONINI <0.03 <0.03 <0.03   BNP (last 3 results) No results for input(s): PROBNP in the last 8760 hours. CBG: No results for input(s): GLUCAP in the last 168 hours. D-Dimer: No results for input(s): DDIMER in the last 72 hours. Hgb A1c: No results for input(s): HGBA1C in the last 72 hours. Lipid Profile: No results for input(s): CHOL, HDL, LDLCALC, TRIG, CHOLHDL, LDLDIRECT in the last 72 hours. Thyroid function studies: No results for input(s): TSH, T4TOTAL, T3FREE, THYROIDAB in the last 72 hours.  Invalid input(s): FREET3 Anemia work up: No results for input(s): VITAMINB12, FOLATE, FERRITIN, TIBC, IRON, RETICCTPCT in the last 72 hours. Sepsis Labs: Recent Labs  Lab 04/16/18 1430 04/16/18 1736 04/16/18 1751 04/17/18 0507 04/19/18 0623  WBC 7.2  --   --  6.3 11.2*  LATICACIDVEN  --  1.41 0.75  --   --     Microbiology Recent Results (from the past 240 hour(s))  MRSA PCR Screening     Status: Abnormal   Collection Time: 04/17/18  1:31 AM  Result Value Ref Range Status   MRSA by PCR POSITIVE (A) NEGATIVE Final    Comment:        The GeneXpert MRSA Assay (FDA approved for NASAL specimens only), is one component of  a comprehensive MRSA colonization surveillance program. It is not intended to diagnose MRSA infection nor to guide or monitor treatment for MRSA infections. RESULT CALLED TO, READ BACK BY AND VERIFIED WITH: COBLE,K RN 04/17/18 AT 0542 SKEEN,P Performed at [facility], [place] [address], [place], [place] [address]   Culture, blood (routine x 2) Call MD if unable to obtain prior to antibiotics being given     Status: None (Preliminary result)   Collection Time: 04/18/18  8:15 PM  Result Value Ref Range Status   Specimen Description BLOOD RIGHT ANTECUBITAL  Final   Special Requests IN PEDIATRIC BOTTLE Blood Culture adequate volume  Final   Culture NO GROWTH < 24 HOURS  Final   Report Status PENDING  Incomplete  Culture, blood (routine x 2)  Call MD if unable to obtain prior to antibiotics being given     Status: None (Preliminary result)   Collection Time: 04/18/18  8:25 PM  Result Value Ref Range Status   Specimen Description BLOOD RIGHT HAND  Final   Special Requests IN PEDIATRIC BOTTLE Blood Culture adequate volume  Final   Culture NO GROWTH < 24 HOURS  Final   Report Status PENDING  Incomplete    Procedures and diagnostic studies:  Dg Chest 2 View  Result Date: 04/18/2018 CLINICAL DATA:  Chest pain, fever. EXAM: CHEST - 2 VIEW COMPARISON:  Radiographs of April 16, 2018. FINDINGS: The heart size and mediastinal contours are within normal limits. No pneumothorax is noted. Hyperexpansion of the lungs is noted consistent with chronic obstructive pulmonary disease. No acute abnormality seen in the right lung. Mild left pleural effusion is noted with associated atelectasis or infiltrate. The visualized skeletal structures are unremarkable. IMPRESSION: Mild left basilar pneumonia or atelectasis is noted with associated pleural effusion. Followup PA and lateral chest X-ray is recommended in 3-4 weeks following trial of antibiotic therapy to ensure resolution and exclude underlying  malignancy. Hyperexpansion of the lungs is noted consistent with chronic obstructive pulmonary disease. Electronically Signed   By: Marijo Conception, M.D.   On: 04/18/2018 16:30   Dg Ribs Unilateral Left  Result Date: 04/18/2018 CLINICAL DATA:  Acute left rib pain.  Fever. EXAM: LEFT RIBS - 2 VIEW COMPARISON:  Radiographs of April 16, 2018. FINDINGS: No fracture or other bone lesions are seen involving the ribs. IMPRESSION: Normal left ribs. Electronically Signed   By: Marijo Conception, M.D.   On: 04/18/2018 16:28    Medications:   . aspirin EC  81 mg Oral Daily  . Chlorhexidine Gluconate Cloth  6 each Topical Q0600  . diltiazem  240 mg Oral Daily  . feeding supplement (ENSURE ENLIVE)  237 mL Oral QID  . fluticasone furoate-vilanterol  1 puff Inhalation Daily   And  . umeclidinium bromide  1 puff Inhalation Daily  . lidocaine  1 patch Transdermal Q24H  . metoprolol tartrate  12.5 mg Oral BID  . mupirocin ointment  1 application Nasal BID  . pantoprazole  40 mg Oral Daily  . polyethylene glycol  17 g Oral Daily  . predniSONE  20 mg Oral Q breakfast  . sertraline  50 mg Oral Daily  . sodium chloride flush  3 mL Intravenous Q12H   Continuous Infusions: . ceFEPime (MAXIPIME) IV 200 mL/hr at 04/19/18 2113  . vancomycin 1,000 mg (04/19/18 2149)     LOS: 3 days   Geradine Girt  [facility]   *Please refer to [email or web address], password [email or web address] to get updated schedule on who will round on this patient, as hospitalists switch teams weekly. If 7PM-7AM, please contact night-coverage at [email or web address], password [email or web address] for any overnight needs.  04/20/2018, 11:02 AM

## 2018-04-20 NOTE — Progress Notes (Signed)
[facility]  Patient Status: Active (receiving services up to time of hospitalization)  [facility] is providing the following services: RN  If patient discharges after hours, please call [phone].   Megan [name] 04/20/2018, 11:23 AM

## 2018-04-20 NOTE — Telephone Encounter (Signed)
Form will be addressed when RB returns to the office on 04/24/18.

## 2018-04-21 LAB — BASIC METABOLIC PANEL
Anion gap: 4 — ABNORMAL LOW (ref 5–15)
BUN: 11 mg/dL (ref 6–20)
CALCIUM: 8.6 mg/dL — AB (ref 8.9–10.3)
CHLORIDE: 96 mmol/L — AB (ref 101–111)
CO2: 37 mmol/L — ABNORMAL HIGH (ref 22–32)
CREATININE: 0.58 mg/dL (ref 0.44–1.00)
Glucose, Bld: 153 mg/dL — ABNORMAL HIGH (ref 65–99)
Potassium: 3.9 mmol/L (ref 3.5–5.1)
SODIUM: 137 mmol/L (ref 135–145)

## 2018-04-21 LAB — CBC
HCT: 31.8 % — ABNORMAL LOW (ref 36.0–46.0)
HEMOGLOBIN: 10 g/dL — AB (ref 12.0–15.0)
MCH: 29.6 pg (ref 26.0–34.0)
MCHC: 31.4 g/dL (ref 30.0–36.0)
MCV: 94.1 fL (ref 78.0–100.0)
PLATELETS: 267 10*3/uL (ref 150–400)
RBC: 3.38 MIL/uL — ABNORMAL LOW (ref 3.87–5.11)
RDW: 13.7 % (ref 11.5–15.5)
WBC: 7.9 10*3/uL (ref 4.0–10.5)

## 2018-04-21 MED ORDER — DOXYCYCLINE HYCLATE 100 MG PO CAPS: 100.0000 mg | ORAL_CAPSULE | Freq: Two times a day (BID) | ORAL | 0 refills | Status: AC

## 2018-04-21 MED ORDER — LIDOCAINE 5 % EX PTCH: 1.0000 | MEDICATED_PATCH | CUTANEOUS | 0 refills | Status: DC

## 2018-04-21 MED ORDER — HYDROCODONE-ACETAMINOPHEN 5-325 MG PO TABS: 1.0000 | ORAL_TABLET | Freq: Four times a day (QID) | ORAL | 0 refills | Status: DC | PRN

## 2018-04-21 MED ORDER — CEFPODOXIME PROXETIL 200 MG PO TABS: 200.0000 mg | ORAL_TABLET | Freq: Two times a day (BID) | ORAL | 0 refills | Status: AC

## 2018-04-21 NOTE — Care Management Note (Signed)
Case Management Note  Patient Details  Name: Megan [name] MRN: [id number] Date of Birth: [birth year]  Subjective/Objective:    COPD exac, PNA                Action/Plan: NCM spoke to pt and offered choice for [facility]. Pt states she is active with [facility] for [facility] aware of dc home today. She has oxygen ([facility]) and RW at home.    Expected Discharge Date:  04/21/18               Expected Discharge Plan:  [place]  In-House Referral:  NA  Discharge planning Services  CM Consult  Post Acute Care Choice:  Home Health, Resumption of Svcs/PTA Provider Choice offered to:  Patient  DME Arranged:  N/A DME Agency:  NA  HH Arranged:  RN [place] Agency:  [place]  Status of Service:  Completed, signed off  If discussed at [place] of Stay Meetings, dates discussed:    Additional Comments:  Erenest Rasher, RN 04/21/2018, 4:15 PM

## 2018-04-21 NOTE — Progress Notes (Signed)
Discharge teaching done and paper is signed.

## 2018-04-21 NOTE — Care Management Important Message (Signed)
Important Message  Patient Details  Name: Megan [name] MRN: [id number] Date of Birth: 1951-05-17   Medicare Important Message Given:  Yes    Erenest Rasher, RN 04/21/2018, 12:00 PM

## 2018-04-21 NOTE — Progress Notes (Signed)
Patient stated she does not have a ride or access to her house until 7:30 pm.

## 2018-04-21 NOTE — Evaluation (Signed)
Physical Therapy Evaluation Patient Details Name: Megan [name] MRN: [id number] DOB: 05-24-1951 Today's Date: 04/21/2018   History of Present Illness  Pt is a 67 y.o. F with significant PMH of COPD (on 2L O2), chronic hypoxic respiratory failure on oxygen, essential hypertension, esophageal problems requiring several episodes of dilatation, who presented with 2-3 day history of increasing chest and abdominal pain after difficulty swallowing.  Clinical Impression  Patient evaluated by Physical Therapy with no further acute PT needs identified. Patient ambulating 250 feet independently with no device. Prior to ambulation, patient with 90% SpO2 on 2 L O2 and decrease to 79% SpO2 on 2 L O2 following ambulation (although still able to communicate while walking). After a ~2 minute rest break, oxygen increased back to 90% SpO2. Will need to continue home O2. Instructed patient on monitoring O2 at home and increasing oxygen to 3 L when mobilizing. All education has been completed and the patient has no further questions. No follow-up Physical Therapy or equipment needs. PT is signing off. Thank you for this referral.     Follow Up Recommendations No PT follow up    Equipment Recommendations  None recommended by PT    Recommendations for Other Services       Precautions / Restrictions Precautions Precautions: Other (comment) Precaution Comments: watch O2 Restrictions Weight Bearing Restrictions: No      Mobility  Bed Mobility Overal bed mobility: Independent                Transfers Overall transfer level: Independent                  Ambulation/Gait Ambulation/Gait assistance: Independent Gait Distance (Feet): 250 Feet Assistive device: None Gait Pattern/deviations: WFL(Within Functional Limits)        Stairs            Wheelchair Mobility    Modified Rankin (Stroke Patients Only)       Balance Overall balance assessment: No apparent balance deficits  (not formally assessed)                                           Pertinent Vitals/Pain Pain Assessment: No/denies pain    Home Living Family/patient expects to be discharged to:: Private residence Living Arrangements: Children(and grandchildren) Available Help at Discharge: Family;Available PRN/intermittently Type of Home: House Home Access: Stairs to enter Entrance Stairs-Rails: None Entrance Stairs-Number of Steps: 2 Home Layout: One level Home Equipment: Cane - single point;Walker - 4 wheels      Prior Function Level of Independence: Independent         Comments: enjoys playing with her grandchildren     Hand Dominance   Dominant Hand: Right    Extremity/Trunk Assessment   Upper Extremity Assessment Upper Extremity Assessment: Overall WFL for tasks assessed    Lower Extremity Assessment Lower Extremity Assessment: Overall WFL for tasks assessed    Cervical / Trunk Assessment Cervical / Trunk Assessment: Normal  Communication   Communication: No difficulties  Cognition Arousal/Alertness: Awake/alert Behavior During Therapy: WFL for tasks assessed/performed Overall Cognitive Status: Within Functional Limits for tasks assessed                                        General Comments General comments (skin integrity, edema, etc.): Extensively  educated on activity recommendations, pacing, conservation strategies, and monitoring oxygen saturation.    Exercises     Assessment/Plan    PT Assessment Patent does not need any further PT services  PT Problem List         PT Treatment Interventions      PT Goals (Current goals can be found in the Care Plan section)  Acute Rehab PT Goals Patient Stated Goal: be able to play with her grandchild PT Goal Formulation: All assessment and education complete, DC therapy    Frequency     Barriers to discharge        Co-evaluation               AM-PAC PT "6 Clicks"  Daily Activity  Outcome Measure Difficulty turning over in bed (including adjusting bedclothes, sheets and blankets)?: None Difficulty moving from lying on back to sitting on the side of the bed? : None Difficulty sitting down on and standing up from a chair with arms (e.g., wheelchair, bedside commode, etc,.)?: None Help needed moving to and from a bed to chair (including a wheelchair)?: None Help needed walking in hospital room?: None Help needed climbing 3-5 steps with a railing? : None 6 Click Score: 24    End of Session Equipment Utilized During Treatment: Oxygen Activity Tolerance: Patient tolerated treatment well Patient left: in bed;with call bell/phone within reach Nurse Communication: Mobility status PT Visit Diagnosis: Difficulty in walking, not elsewhere classified (R26.2);Unsteadiness on feet (R26.81)    Time: 1828-8337 PT Time Calculation (min) (ACUTE ONLY): 29 min   Charges:   PT Evaluation $PT Eval Low Complexity: 1 Low PT Treatments $Therapeutic Activity: 8-22 mins   PT G Codes:       Ellamae Sia, PT, DPT Acute Rehabilitation Services  Pager: [phone]   Willy Eddy 04/21/2018, 12:38 PM

## 2018-04-21 NOTE — Discharge Summary (Signed)
Physician Discharge Summary  Megan [name] GQQ:[id number] DOB: 08/16/1951 DOA: 04/16/2018  PCP: [name], MD  Admit date: 04/16/2018 Discharge date: 04/21/2018  Admitted From: Home Disposition:  Home  Discharge Condition:Stable CODE STATUS:FULL Diet recommendation: Heart Healthy  Brief/Interim Summary: Patient  is an 67 y.o. female PMH COPD, chronic hypoxic respiratory failure on oxygen, essential hypertension, esophageal problems requiring several episodes of dilatation, presented to the emergency department with 2-3-day history of increasing chest and abdominal pain after developing difficulty swallowing. She was  unable to eat or drink anything for the last 2 days secondary to severe pain and some regurgitation. Symptoms similar to previous esophageal problems. Pain described as severe, pressure, improved with nitroglycerin in the emergency department, associated with some spitting up without frank vomiting. She was also found to have left-sided pneumonia.  She was a started treatment for possible healthcare associated  pneumonia versus aspiration pneumonia.  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  Also underwent EGD by GI and food was found in the distal esophagus.  Her respiratory status is currently stable.  She responded to antibiotics.  Her pneumonia has clinically improved.  She will follow-up with gastroenterology as an outpatient on July 1 for esophageal manometry.  Antibiotics have been changed to oral today.  She is stable for discharge home.  Following problems were addressed during her hospitalization:  Left sided pneumonia -treated as hospital associated since has been hospitalized in last 3 months -She is afebrile and without leukocytosis. -Antibiotics changed to oral: Cefpodoxime and doxycycline that she will continue for 4 more days.    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 Large hiatal hernia with stasis of contrast. 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 -s/p EGD:  A non-bleeding diverticulum with a large opening and no stigmata ofrecent bleeding was found in the mid esophagus. Food was found in the distal esophagus. It was gently advanced into the stomach under direct visualization.The lumen of the esophagus was moderately dilated. - Esophageal motility disorder and esophageal food impaction contributing to dysphagia. - Full liquid diet today. Advance to soft diet as tolerated. Avoid foods that causes difficulty swallowing. - Antireflux measures. - Perform routine esophageal manometry as outpatient at appointment to be scheduled. - Protonix 40 mg po daily. - GI follow up after manometry reading.    COPD, chronic hypoxic respiratory failure on home oxygen --Appears stable at this point. Continue home bronchodilators, oxygen.  Essential hypertension --Resume home meds  GERD -Resume home PPI      Discharge Diagnoses:  Principal Problem:   Epigastric pain Active Problems:   Essential hypertension   Esophageal reflux   COPD (chronic obstructive pulmonary disease) (HCC)   Abdominal pain   Chest pain   Chronic respiratory failure with hypoxia (HCC)   Dysphagia   Odynophagia   Food impaction of esophagus   Abnormal esophagram    Discharge Instructions  Discharge Instructions    Diet - low sodium heart healthy   Complete by:  As directed    Discharge instructions   Complete by:  As directed    1) Follow up with your PCP in a week. 2) Take prescribed  medications as instructed. 3) You have appointment on July 1 for esophageal manometry test ,please follow-up with that.   Increase activity slowly   Complete  by:  As directed       Allergies as of 04/21/2018      Reactions   Penicillins Rash, Hives   Has patient had a PCN reaction causing immediate rash, facial/tongue/throat swelling, SOB or lightheadedness with hypotension: No Has patient had a PCN reaction causing severe rash involving mucus membranes or skin necrosis:NO Has patient had a PCN reaction that required hospitalization No Has patient had a PCN reaction occurring within the last 10 years:NO If all of the above answers are "NO", then may proceed with Cephalosporin use.   Fentanyl And Related Other (See Comments)   Behavioral changes per daughter      Medication List    STOP taking these medications   Fluticasone-Salmeterol 500-50 MCG/DOSE Aepb Commonly known as:  ADVAIR DISKUS   levalbuterol 45 MCG/ACT inhaler Commonly known as:  XOPENEX HFA   umeclidinium bromide 62.5 MCG/INH Aepb Commonly known as:  INCRUSE ELLIPTA     TAKE these medications   albuterol 108 (90 Base) MCG/ACT inhaler Commonly known as:  PROVENTIL HFA;VENTOLIN HFA Inhale 2 puffs into the lungs every 6 (six) hours as needed for wheezing or shortness of breath. Insurance preference   ALPRAZolam 0.25 MG tablet Commonly known as:  XANAX TAKE 1 TABLET(0.25 MG) BY MOUTH TWICE DAILY AS NEEDED FOR ANXIETY What changed:  See the new instructions.   aspirin EC 81 MG tablet Take 81 mg by mouth daily.   budesonide 0.5 MG/2ML nebulizer solution Commonly known as:  PULMICORT Take 2 mLs (0.5 mg total) by nebulization 2 (two) times daily.   butalbital-acetaminophen-caffeine 50-325-40 MG tablet Commonly known as:  FIORICET, ESGIC Take 1 tablet by mouth 2 (two) times daily as needed for headache.   cefpodoxime 200 MG tablet Commonly known as:  VANTIN Take 1 tablet (200 mg total) by mouth 2 (two) times daily for 4 days.   diltiazem 240 MG 24 hr capsule Commonly known as:  CARDIZEM CD Take 1 capsule (240 mg total) by mouth daily.   doxycycline 100 MG capsule Commonly known as:   VIBRAMYCIN Take 1 capsule (100 mg total) by mouth 2 (two) times daily for 4 days.   feeding supplement (ENSURE ENLIVE) Liqd Take 237 mLs by mouth 4 (four) times daily.   fluticasone 50 MCG/ACT nasal spray Commonly known as:  FLONASE Place 2 sprays into both nostrils daily.   Fluticasone-Umeclidin-Vilant 100-62.5-25 MCG/INH Aepb Commonly known as:  TRELEGY ELLIPTA Inhale 1 puff into the lungs daily.   HYDROcodone-acetaminophen 5-325 MG tablet Commonly known as:  NORCO/VICODIN Take 1 tablet by mouth every 6 (six) hours as needed for moderate pain.   levalbuterol 1.25 MG/0.5ML nebulizer solution Commonly known as:  XOPENEX Take 1.25 mg by nebulization every 6 (six) hours.   lidocaine 5 % Commonly known as:  LIDODERM Place 1 patch onto the skin daily. Remove & Discard patch within 12 hours or as directed by MD   loratadine 10 MG tablet Commonly known as:  CLARITIN Take 1 tablet (10 mg total) daily by mouth.   metoprolol tartrate 25 MG tablet Commonly known as:  LOPRESSOR Take 0.5 tablets (12.5 mg total) by mouth 2 (two) times daily.   pantoprazole 40 MG tablet Commonly known as:  PROTONIX Take 1 tablet (40 mg total) by mouth daily.   polyethylene glycol packet Commonly known as:  MIRALAX / GLYCOLAX Take 17 g by mouth 2 (two) times daily.   predniSONE 20 MG tablet Commonly known as:  DELTASONE Take 1 tablet (20 mg  total) by mouth daily with breakfast.   senna-docusate 8.6-50 MG tablet Commonly known as:  Senokot-S Take 1 tablet by mouth 2 (two) times daily.   sertraline 50 MG tablet Commonly known as:  ZOLOFT Take 1 tablet (50 mg total) by mouth daily.   tiZANidine 4 MG tablet Commonly known as:  ZANAFLEX TAKE 1 TABLET(4 MG) BY MOUTH EVERY 6 HOURS AS NEEDED FOR MUSCLE SPASMS      Follow-up Information    [facility] Follow up on 05/01/2018.   Why:  arrive 9:30 for 10:30 appt for manometry.  go to admissions and follow preop  instructions sent to you in mail.   Contact information: [place] [phone] [place]       Alfredia Ferguson, PA-C Follow up on 05/24/2018.   Specialty:  Gastroenterology Why:  10 AM visit with GI physician assistant.   Contact information: [place] [address] [phone]        [name], MD. Schedule an appointment as soon as possible for a visit in 1 week(s).   Specialties:  Internal Medicine, Radiology Contact information: [address] [place] [address] [phone]          Allergies  Allergen Reactions  . Penicillins Rash and Hives    Has patient had a PCN reaction causing immediate rash, facial/tongue/throat swelling, SOB or lightheadedness with hypotension: No Has patient had a PCN reaction causing severe rash involving mucus membranes or skin necrosis:NO Has patient had a PCN reaction that required hospitalization No Has patient had a PCN reaction occurring within the last 10 years:NO If all of the above answers are "NO", then may proceed with Cephalosporin use.  . Fentanyl And Related Other (See Comments)    Behavioral changes per daughter    Consultations: Gastroenterology  Procedures/Studies: Dg Chest 2 View  Result Date: 04/18/2018 CLINICAL DATA:  Chest pain, fever. EXAM: CHEST - 2 VIEW COMPARISON:  Radiographs of April 16, 2018. FINDINGS: The heart size and mediastinal contours are within normal limits. No pneumothorax is noted. Hyperexpansion of the lungs is noted consistent with chronic obstructive pulmonary disease. No acute abnormality seen in the right lung. Mild left pleural effusion is noted with associated atelectasis or infiltrate. The visualized skeletal structures are unremarkable. IMPRESSION: Mild left basilar pneumonia or atelectasis is noted with associated pleural effusion. Followup PA and lateral chest X-ray is recommended in 3-4 weeks following trial of  antibiotic therapy to ensure resolution and exclude underlying malignancy. Hyperexpansion of the lungs is noted consistent with chronic obstructive pulmonary disease. Electronically Signed   By: Marijo Conception, M.D.   On: 04/18/2018 16:30   Dg Chest 2 View  Result Date: 04/16/2018 CLINICAL DATA:  Upper abdominal and lower chest pain radiating to the back with vomiting. EXAM: CHEST - 2 VIEW COMPARISON:  CT 02/23/2018 and CXR 02/20/2018 FINDINGS: The heart size and mediastinal contours are within normal limits. Nonaneurysmal atherosclerotic aorta. Emphysematous hyperinflation of the lungs with clearing of posterior pleural effusions since prior exam. Nodular densities compatible with nipple shadows are seen bilaterally overlying the mid lung, best seen on the frontal view. These can be confirmed with nipple markers and repeat imaging. Superimposed small moderate-sized hiatal hernia. Osteopenic appearance of the bony thorax without acute nor suspicious osseous appearing abnormalities. IMPRESSION: 1. Emphysematous hyperinflation of the lungs compatible COPD. No active pulmonary disease. 2. Small to moderate-sized hiatal hernia. 3. Clearing of  bilateral posterior pleural effusions since prior. 4. Nodular densities seen at the same level bilaterally overlying the mid lung compatible with nipple shadows. These can be confirmed with repeat imaging and nipple markers in place to exclude pulmonary nodules. Electronically Signed   By: Ashley Royalty M.D.   On: 04/16/2018 15:57   Dg Ribs Unilateral Left  Result Date: 04/18/2018 CLINICAL DATA:  Acute left rib pain.  Fever. EXAM: LEFT RIBS - 2 VIEW COMPARISON:  Radiographs of April 16, 2018. FINDINGS: No fracture or other bone lesions are seen involving the ribs. IMPRESSION: Normal left ribs. Electronically Signed   By: Marijo Conception, M.D.   On: 04/18/2018 16:28   Dg Esophagus  Result Date: 04/17/2018 CLINICAL DATA:  Regurgitation of food, epigastric pain. EXAM:  ESOPHOGRAM/BARIUM SWALLOW TECHNIQUE: Single contrast examination was performed using  thin barium. FLUOROSCOPY TIME:  Fluoroscopy Time:  2 minutes 16 seconds Radiation Exposure Index (if provided by the fluoroscopic device): Number of Acquired Spot Images: 0 COMPARISON:  None FINDINGS: Fluoroscopic evaluation of swallowing demonstrates disruption of primary esophageal peristaltic waves with stasis. 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 There is stasis of contrast within the diverticulum. Large hiatal hernia noted. No fixed esophageal stricture. 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 Large hiatal hernia with stasis of contrast. 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 Electronically Signed   By: Rolm Baptise M.D.   On: 04/17/2018 10:45       Subjective: Patient seen and examined the bedside this morning.  Remains comfortable.  Stable for discharge to home.  No new issues or events.  Discharge Exam: Vitals:   04/21/18 0920 04/21/18 0922  BP: (!) 111/57   Pulse: 74   Resp:    Temp:    SpO2:  93%   Vitals:   04/20/18 2154 04/21/18 0642 04/21/18 0920 04/21/18 0922  BP: 136/70 115/70 (!) 111/57   Pulse: 79 64 74   Resp: 18 20    Temp: 98.2 F (36.8 C) 98.2 F (36.8 C)    TempSrc: Oral Oral    SpO2: 93% 92%  93%  Weight:      Height:        General: Pt is alert, awake, not in acute distress Cardiovascular: RRR, S1/S2 +, no rubs, no gallops Respiratory: CTA bilaterally, no wheezing, no rhonchi Abdominal: Soft, NT, ND, bowel sounds + Extremities: no edema, no cyanosis    The results of significant diagnostics from this hospitalization (including imaging, microbiology, ancillary and laboratory) are listed below for reference.     Microbiology: Recent Results (from the past 240 hour(s))  MRSA PCR  Screening     Status: Abnormal   Collection Time: 04/17/18  1:31 AM  Result Value Ref Range Status   MRSA by PCR POSITIVE (A) NEGATIVE Final    Comment:        The GeneXpert MRSA Assay (FDA approved for NASAL specimens only), is one component of a comprehensive MRSA colonization surveillance program. It is not intended to diagnose MRSA infection nor to guide or monitor treatment for MRSA infections. RESULT CALLED TO, READ BACK BY AND VERIFIED WITH: COBLE,K RN 04/17/18 AT 0542 SKEEN,P Performed at [facility], [place] [address], [place], [place] [address]   Culture, blood (routine x 2) Call MD if unable to obtain prior to antibiotics being given  Status: None (Preliminary result)   Collection Time: 04/18/18  8:15 PM  Result Value Ref Range Status   Specimen Description BLOOD RIGHT ANTECUBITAL  Final   Special Requests IN PEDIATRIC BOTTLE Blood Culture adequate volume  Final   Culture   Final    NO GROWTH 2 DAYS Performed at [facility], [place] [address], [place], [place] [address]    Report Status PENDING  Incomplete  Culture, blood (routine x 2) Call MD if unable to obtain prior to antibiotics being given     Status: None (Preliminary result)   Collection Time: 04/18/18  8:25 PM  Result Value Ref Range Status   Specimen Description BLOOD RIGHT HAND  Final   Special Requests IN PEDIATRIC BOTTLE Blood Culture adequate volume  Final   Culture   Final    NO GROWTH 2 DAYS Performed at [facility], [place] [address], [place], [place] [address]    Report Status PENDING  Incomplete     Labs: BNP (last 3 results) Recent Labs    02/12/18 2052  BNP 703.5*   Basic Metabolic Panel: Recent Labs  Lab 04/16/18 1430 04/17/18 0507 04/19/18 0623 04/21/18 0527  NA 138 139 138 137  K 4.8 4.1 3.4* 3.9  CL 93* 96* 91* 96*  CO2 35* 36* 36* 37*  GLUCOSE 135* 71 134* 153*  BUN 9 9 11 11   CREATININE 0.68 0.67 0.63 0.58  CALCIUM 9.4 8.7* 9.2 8.6*   Liver Function  Tests: Recent Labs  Lab 04/16/18 1430  AST 30  ALT 20  ALKPHOS 67  BILITOT 0.6  PROT 7.2  ALBUMIN 3.8   Recent Labs  Lab 04/16/18 1430  LIPASE 25   No results for input(s): AMMONIA in the last 168 hours. CBC: Recent Labs  Lab 04/16/18 1430 04/17/18 0507 04/19/18 0623 04/21/18 0527  WBC 7.2 6.3 11.2* 7.9  HGB 11.2* 10.7* 11.0* 10.0*  HCT 36.8 34.8* 34.0* 31.8*  MCV 96.1 96.7 92.1 94.1  PLT 379 354 314 267   Cardiac Enzymes: Recent Labs  Lab 04/16/18 2040 04/17/18 0507 04/17/18 0916  TROPONINI <0.03 <0.03 <0.03   BNP: Invalid input(s): POCBNP CBG: No results for input(s): GLUCAP in the last 168 hours. D-Dimer No results for input(s): DDIMER in the last 72 hours. Hgb A1c No results for input(s): HGBA1C in the last 72 hours. Lipid Profile No results for input(s): CHOL, HDL, LDLCALC, TRIG, CHOLHDL, LDLDIRECT in the last 72 hours. Thyroid function studies No results for input(s): TSH, T4TOTAL, T3FREE, THYROIDAB in the last 72 hours.  Invalid input(s): FREET3 Anemia work up No results for input(s): VITAMINB12, FOLATE, FERRITIN, TIBC, IRON, RETICCTPCT in the last 72 hours. Urinalysis    Component Value Date/Time   COLORURINE STRAW (A) 04/16/2018 1617   APPEARANCEUR CLEAR 04/16/2018 1617   LABSPEC 1.008 04/16/2018 1617   PHURINE 9.0 (H) 04/16/2018 1617   GLUCOSEU NEGATIVE 04/16/2018 1617   GLUCOSEU NEGATIVE 01/04/2018 1406   HGBUR NEGATIVE 04/16/2018 1617   BILIRUBINUR NEGATIVE 04/16/2018 1617   KETONESUR NEGATIVE 04/16/2018 1617   PROTEINUR NEGATIVE 04/16/2018 1617   UROBILINOGEN 0.2 01/04/2018 1406   NITRITE NEGATIVE 04/16/2018 1617   LEUKOCYTESUR NEGATIVE 04/16/2018 1617   Sepsis Labs Invalid input(s): PROCALCITONIN,  WBC,  LACTICIDVEN Microbiology Recent Results (from the past 240 hour(s))  MRSA PCR Screening     Status: Abnormal   Collection Time: 04/17/18  1:31 AM  Result Value Ref Range Status   MRSA by PCR POSITIVE (A)  NEGATIVE Final     Comment:        The GeneXpert MRSA Assay (FDA approved for NASAL specimens only), is one component of a comprehensive MRSA colonization surveillance program. It is not intended to diagnose MRSA infection nor to guide or monitor treatment for MRSA infections. RESULT CALLED TO, READ BACK BY AND VERIFIED WITH: COBLE,K RN 04/17/18 AT 0542 SKEEN,P Performed at [facility], [place] [address] [place], [place] [address]   Culture, blood (routine x 2) Call MD if unable to obtain prior to antibiotics being given     Status: None (Preliminary result)   Collection Time: 04/18/18  8:15 PM  Result Value Ref Range Status   Specimen Description BLOOD RIGHT ANTECUBITAL  Final   Special Requests IN PEDIATRIC BOTTLE Blood Culture adequate volume  Final   Culture   Final    NO GROWTH 2 DAYS Performed at [facility], [place] [address] [place], [place] [address]    Report Status PENDING  Incomplete  Culture, blood (routine x 2) Call MD if unable to obtain prior to antibiotics being given     Status: None (Preliminary result)   Collection Time: 04/18/18  8:25 PM  Result Value Ref Range Status   Specimen Description BLOOD RIGHT HAND  Final   Special Requests IN PEDIATRIC BOTTLE Blood Culture adequate volume  Final   Culture   Final    NO GROWTH 2 DAYS Performed at [facility], [place] [address] [place],  [address]    Report Status PENDING  Incomplete    Please note: You were cared for by a hospitalist during your hospital stay. Once you are discharged, your primary care physician will handle any further medical issues. Please note that NO REFILLS for any discharge medications will be authorized once you are discharged, as it is imperative that you return to your primary care physician (or establish a relationship with a primary care physician if you do not have one) for your post hospital discharge needs so that they can reassess your need for medications and monitor your lab  values.    Time coordinating discharge: 40 minutes  SIGNED:   Shelly Coss, MD  [facility] 04/21/2018, 10:50 AM Pager [phone]  If 7PM-7AM, please contact night-coverage [email or web address] Password [email or web address]

## 2018-04-23 LAB — CULTURE, BLOOD (ROUTINE X 2)
CULTURE: NO GROWTH
CULTURE: NO GROWTH
SPECIAL REQUESTS: ADEQUATE
Special Requests: ADEQUATE

## 2018-04-24 NOTE — Telephone Encounter (Signed)
Form has been given to Dr. Lamonte Sakai to sign. Will update chart once this form is returned to me.

## 2018-04-25 NOTE — Telephone Encounter (Signed)
Form has been signed and returned to me. It has been faxed back to [facility]. Nothing further was needed.

## 2018-04-26 DIAGNOSIS — J441 Chronic obstructive pulmonary disease with (acute) exacerbation: Secondary | ICD-10-CM | POA: Diagnosis not present

## 2018-04-26 DIAGNOSIS — D649 Anemia, unspecified: Secondary | ICD-10-CM | POA: Diagnosis not present

## 2018-04-26 DIAGNOSIS — E43 Unspecified severe protein-calorie malnutrition: Secondary | ICD-10-CM | POA: Diagnosis not present

## 2018-04-26 DIAGNOSIS — I5030 Unspecified diastolic (congestive) heart failure: Secondary | ICD-10-CM | POA: Diagnosis not present

## 2018-04-26 DIAGNOSIS — I2510 Atherosclerotic heart disease of native coronary artery without angina pectoris: Secondary | ICD-10-CM | POA: Diagnosis not present

## 2018-04-26 DIAGNOSIS — I110 Hypertensive heart disease with heart failure: Secondary | ICD-10-CM | POA: Diagnosis not present

## 2018-04-30 DIAGNOSIS — Z7951 Long term (current) use of inhaled steroids: Secondary | ICD-10-CM | POA: Diagnosis not present

## 2018-04-30 DIAGNOSIS — D649 Anemia, unspecified: Secondary | ICD-10-CM | POA: Diagnosis not present

## 2018-04-30 DIAGNOSIS — I5030 Unspecified diastolic (congestive) heart failure: Secondary | ICD-10-CM | POA: Diagnosis not present

## 2018-04-30 DIAGNOSIS — I2510 Atherosclerotic heart disease of native coronary artery without angina pectoris: Secondary | ICD-10-CM | POA: Diagnosis not present

## 2018-04-30 DIAGNOSIS — E43 Unspecified severe protein-calorie malnutrition: Secondary | ICD-10-CM | POA: Diagnosis not present

## 2018-04-30 DIAGNOSIS — J9602 Acute respiratory failure with hypercapnia: Secondary | ICD-10-CM | POA: Diagnosis not present

## 2018-04-30 DIAGNOSIS — Z87891 Personal history of nicotine dependence: Secondary | ICD-10-CM | POA: Diagnosis not present

## 2018-04-30 DIAGNOSIS — K219 Gastro-esophageal reflux disease without esophagitis: Secondary | ICD-10-CM | POA: Diagnosis not present

## 2018-04-30 DIAGNOSIS — J189 Pneumonia, unspecified organism: Secondary | ICD-10-CM | POA: Diagnosis not present

## 2018-04-30 DIAGNOSIS — R918 Other nonspecific abnormal finding of lung field: Secondary | ICD-10-CM | POA: Diagnosis not present

## 2018-04-30 DIAGNOSIS — F329 Major depressive disorder, single episode, unspecified: Secondary | ICD-10-CM | POA: Diagnosis not present

## 2018-04-30 DIAGNOSIS — F4310 Post-traumatic stress disorder, unspecified: Secondary | ICD-10-CM | POA: Diagnosis not present

## 2018-04-30 DIAGNOSIS — I110 Hypertensive heart disease with heart failure: Secondary | ICD-10-CM | POA: Diagnosis not present

## 2018-04-30 DIAGNOSIS — R1310 Dysphagia, unspecified: Secondary | ICD-10-CM | POA: Diagnosis not present

## 2018-04-30 DIAGNOSIS — Z7982 Long term (current) use of aspirin: Secondary | ICD-10-CM | POA: Diagnosis not present

## 2018-04-30 DIAGNOSIS — J441 Chronic obstructive pulmonary disease with (acute) exacerbation: Secondary | ICD-10-CM | POA: Diagnosis not present

## 2018-04-30 DIAGNOSIS — J9601 Acute respiratory failure with hypoxia: Secondary | ICD-10-CM | POA: Diagnosis not present

## 2018-04-30 DIAGNOSIS — J440 Chronic obstructive pulmonary disease with acute lower respiratory infection: Secondary | ICD-10-CM | POA: Diagnosis not present

## 2018-04-30 DIAGNOSIS — Z8701 Personal history of pneumonia (recurrent): Secondary | ICD-10-CM | POA: Diagnosis not present

## 2018-05-01 ENCOUNTER — Ambulatory Visit (HOSPITAL_COMMUNITY): Admission: RE | Admit: 2018-05-01 | Payer: Medicare Other | Source: Ambulatory Visit | Admitting: Gastroenterology

## 2018-05-01 ENCOUNTER — Encounter (HOSPITAL_COMMUNITY): Admission: RE | Payer: Self-pay | Source: Ambulatory Visit

## 2018-05-01 SURGERY — MANOMETRY, ESOPHAGUS
Anesthesia: Topical

## 2018-05-02 ENCOUNTER — Telehealth: Payer: Self-pay | Admitting: Emergency Medicine

## 2018-05-02 DIAGNOSIS — I5030 Unspecified diastolic (congestive) heart failure: Secondary | ICD-10-CM | POA: Diagnosis not present

## 2018-05-02 DIAGNOSIS — I110 Hypertensive heart disease with heart failure: Secondary | ICD-10-CM | POA: Diagnosis not present

## 2018-05-02 DIAGNOSIS — J189 Pneumonia, unspecified organism: Secondary | ICD-10-CM | POA: Diagnosis not present

## 2018-05-02 DIAGNOSIS — I2510 Atherosclerotic heart disease of native coronary artery without angina pectoris: Secondary | ICD-10-CM | POA: Diagnosis not present

## 2018-05-02 DIAGNOSIS — J441 Chronic obstructive pulmonary disease with (acute) exacerbation: Secondary | ICD-10-CM | POA: Diagnosis not present

## 2018-05-02 DIAGNOSIS — J440 Chronic obstructive pulmonary disease with acute lower respiratory infection: Secondary | ICD-10-CM | POA: Diagnosis not present

## 2018-05-02 NOTE — Telephone Encounter (Signed)
Edwyna Ready from [facility] is calling back [phone]

## 2018-05-02 NOTE — Telephone Encounter (Signed)
Called and spoke to [name] RN, [name], and the patient. Patient has been having increase heart rate, cough, shortness of breath, and low grade fever. [name] stated she was initiating their protocol of drawing labs and getting an xray and would fax these over to [place]. Scheduled acute visit with [name], NP for 05/03/18 at 11:00. Nothing further needed at this time.

## 2018-05-02 NOTE — Telephone Encounter (Signed)
Patient is scheduled for an acute visit tomorrow 7/3 at 11am. Patient has been having an increased heart rate, cough, shortness of breath, and low grade fever (100.1) The home health nurse per protocol attempted to draw blood and was unable to get sample.   TP do you want [place] to put in lab orders prior to her appointment tomorrow?

## 2018-05-02 NOTE — Telephone Encounter (Signed)
Called and spoke to [facility] again. She stated that patient did not want to do chest xray through them and she would wait until visit with TP.  Routing to TP and Jess as FYI.

## 2018-05-03 ENCOUNTER — Ambulatory Visit (INDEPENDENT_AMBULATORY_CARE_PROVIDER_SITE_OTHER)
Admission: RE | Admit: 2018-05-03 | Discharge: 2018-05-03 | Disposition: A | Discharge status: Home / Self Care | Payer: Medicare Other | Source: Ambulatory Visit | Attending: Primary Care | Admitting: Primary Care

## 2018-05-03 ENCOUNTER — Ambulatory Visit (INDEPENDENT_AMBULATORY_CARE_PROVIDER_SITE_OTHER): Payer: Medicare Other | Admitting: Primary Care

## 2018-05-03 ENCOUNTER — Encounter: Payer: Self-pay | Admitting: Primary Care

## 2018-05-03 ENCOUNTER — Other Ambulatory Visit (INDEPENDENT_AMBULATORY_CARE_PROVIDER_SITE_OTHER): Payer: Medicare Other

## 2018-05-03 VITALS — BP 120/70 | HR 110 | Temp 101.9°F | Ht 62.5 in | Wt 86.0 lb

## 2018-05-03 DIAGNOSIS — R1310 Dysphagia, unspecified: Secondary | ICD-10-CM | POA: Diagnosis not present

## 2018-05-03 DIAGNOSIS — J9611 Chronic respiratory failure with hypoxia: Secondary | ICD-10-CM | POA: Diagnosis not present

## 2018-05-03 DIAGNOSIS — R05 Cough: Secondary | ICD-10-CM | POA: Diagnosis not present

## 2018-05-03 DIAGNOSIS — R509 Fever, unspecified: Secondary | ICD-10-CM

## 2018-05-03 DIAGNOSIS — J189 Pneumonia, unspecified organism: Secondary | ICD-10-CM

## 2018-05-03 DIAGNOSIS — R1319 Other dysphagia: Secondary | ICD-10-CM

## 2018-05-03 LAB — BASIC METABOLIC PANEL
BUN: 15 mg/dL (ref 6–23)
CHLORIDE: 90 meq/L — AB (ref 96–112)
CO2: 35 meq/L — AB (ref 19–32)
Calcium: 8.7 mg/dL (ref 8.4–10.5)
Creatinine, Ser: 0.66 mg/dL (ref 0.40–1.20)
GFR: 95.46 mL/min (ref >60.00)
Glucose, Bld: 71 mg/dL (ref 70–99)
POTASSIUM: 4.2 meq/L (ref 3.5–5.1)
Sodium: 135 mEq/L (ref 135–145)

## 2018-05-03 LAB — CBC WITH DIFFERENTIAL/PLATELET
BASOS PCT: 0.9 % (ref 0.0–3.0)
Basophils Absolute: 0.1 10*3/uL (ref 0.0–0.1)
EOS PCT: 0 % (ref 0.0–5.0)
Eosinophils Absolute: 0 10*3/uL (ref 0.0–0.7)
HEMATOCRIT: 35.9 % — AB (ref 36.0–46.0)
HEMOGLOBIN: 11.8 g/dL — AB (ref 12.0–15.0)
LYMPHS PCT: 13.4 % (ref 12.0–46.0)
Lymphs Abs: 0.8 10*3/uL (ref 0.7–4.0)
MCHC: 33 g/dL (ref 30.0–36.0)
MCV: 91.1 fl (ref 78.0–100.0)
Monocytes Absolute: 0.7 10*3/uL (ref 0.1–1.0)
Monocytes Relative: 12.3 % — ABNORMAL HIGH (ref 3.0–12.0)
NEUTROS ABS: 4.3 10*3/uL (ref 1.4–7.7)
Neutrophils Relative %: 73.4 % (ref 43.0–77.0)
PLATELETS: 413 10*3/uL — AB (ref 150.0–400.0)
RBC: 3.94 Mil/uL (ref 3.87–5.11)
RDW: 15.4 % (ref 11.5–15.5)
WBC: 5.9 10*3/uL (ref 4.0–10.5)

## 2018-05-03 MED ORDER — LEVOFLOXACIN 500 MG PO TABS: 500.0000 mg | ORAL_TABLET | Freq: Every day | ORAL | 0 refills | Status: DC

## 2018-05-03 NOTE — Assessment & Plan Note (Addendum)
New; Likely d/t aspiration. CXR showing complete clearing of left side infiltrate, chronic emphysema and bronchitic changes. Plan levaquin x 7 days, tylenol prn, encourage oral fluids and deep breathing exercises. FU in office on Monday.

## 2018-05-03 NOTE — Assessment & Plan Note (Addendum)
Recent hosp admission in June and treated for left side PNA with cefpodoxime and doxycycline.  Complains of fever and cough x3 days. Appears well in office. Breathing is not labored and no resp distress noted. Requiring 3L O2. CXR obtained showing complete clearing of recent left sided PNA, severe emphysema and bronchitic changes.   Will treat for suspected aspiration vs bronchitis with levaquin x7 days and follow-up in 5 days. Instructed to go to ED if symptoms worsen d/t holiday weekend.

## 2018-05-03 NOTE — Assessment & Plan Note (Addendum)
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   Patient reports eating chicken wings and fries 2 days ago and vomiting, likely aspirated. Strongly encourage soft solid diet with strict aspiration precautions.

## 2018-05-03 NOTE — Patient Instructions (Addendum)
Please adhere to soft solids diet and aspiration precautions at all times Encourage oral fluids,Tylenol prn temp CXR showing resolved PNA. Will send new ABX today.  Cough and deep breathing exercises ED if not improving Follow-up on Monday in office

## 2018-05-03 NOTE — Progress Notes (Signed)
@Patient  ID: Megan [name], female    DOB: 11/29/1950, 67 y.o.   MRN: [id number]  Chief Complaint  Patient presents with  . Acute Visit    3 days of cough mucus is thick and cant get it up and fever 100.1 . She threw up yesterday    Referring provider: [name], MD  HPI: Presents today with complaints of fever and cough x3 days. Temp 101 today in office. Recently admitted in hosp for Left side PNA, treated with cefpodoxime and doxycycline. 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 Instructed to have soft diet. States that she ate chicken wings and carnival fries 2 days ago and vomited. She has not had anything to eat today and has not taken her medications. She feels as though she could try eating today. She is alert and awake, communicating without trouble. Wearing home oxygen, 3L 91%. She does not want to go to acute rehab. Lives with her daughter and has some to little support at home. She also reports that she has some additional nursing support at home for her granddaughters. Plan to obtain stat CXR today and labs.   Rexene Edison, NP in exam room to help formulate plan of care.   Allergies  Allergen Reactions  . Penicillins Rash and Hives    Has patient had a PCN reaction causing immediate rash, facial/tongue/throat swelling, SOB or lightheadedness with hypotension: No Has patient had a PCN reaction causing severe rash involving mucus membranes or skin necrosis:NO Has patient had a PCN reaction that required hospitalization No Has patient had a PCN reaction occurring within the last 10 years:NO If all of the above answers are "NO", then may proceed with Cephalosporin use.  . Fentanyl And Related Other (See Comments)    Behavioral changes per daughter    Immunization History  Administered Date(s) Administered  . HiB (PRP-OMP) 09/10/2015  . Influenza, High Dose Seasonal PF 08/27/2016  . Influenza,inj,Quad PF,6+ Mos 09/16/2017  . Pneumococcal  Conjugate-13 09/16/2017  . Tdap 09/16/2017    Past Medical History:  Diagnosis Date  . Anemia   . Anxiety state 08/20/2015  . CAP (community acquired pneumonia)   . CHF (congestive heart failure) ([place])   . COPD (chronic obstructive pulmonary disease) ([place])   . Depression   . Essential hypertension 08/19/2015  . GERD (gastroesophageal reflux disease)   . Headache   . History of hiatal hernia   . Myocardial infarction ([place])   . Shortness of breath dyspnea     Tobacco History: Social History   Tobacco Use  Smoking Status Former Smoker  . Packs/day: 1.00  . Years: 30.00  . Pack years: 30.00  . Types: Cigarettes  . Last attempt to quit: 08/18/2000  . Years since quitting: 17.7  Smokeless Tobacco Never Used   Counseling given: Not Answered   Outpatient Medications Prior to Visit  Medication Sig Dispense Refill  . albuterol (PROVENTIL HFA;VENTOLIN HFA) 108 (90 Base) MCG/ACT inhaler Inhale 2 puffs into the lungs every 6 (six) hours as needed for wheezing or shortness of breath. Insurance preference 1 Inhaler 5  . ALPRAZolam (XANAX) 0.25 MG tablet TAKE 1 TABLET(0.25 MG) BY MOUTH TWICE DAILY AS NEEDED FOR ANXIETY 60 tablet 2  . aspirin EC 81 MG tablet Take 81 mg by mouth daily.    . butalbital-acetaminophen-caffeine (FIORICET, ESGIC) 50-325-40 MG tablet Take 1 tablet by mouth 2 (two) times daily as needed for headache. 30 tablet 0  . diltiazem (CARDIZEM CD) 240  MG 24 hr capsule Take 1 capsule (240 mg total) by mouth daily. 30 capsule 3  . feeding supplement, ENSURE ENLIVE, (ENSURE ENLIVE) LIQD Take 237 mLs by mouth 4 (four) times daily. 237 mL 0  . fluticasone (FLONASE) 50 MCG/ACT nasal spray Place 2 sprays into both nostrils daily. 16 g 6  . Fluticasone-Umeclidin-Vilant (TRELEGY ELLIPTA) 100-62.5-25 MCG/INH AEPB Inhale 1 puff into the lungs daily. 60 each 5  . HYDROcodone-acetaminophen (NORCO/VICODIN) 5-325 MG tablet Take 1 tablet by mouth every 6 (six) hours as needed for  moderate pain. 10 tablet 0  . levalbuterol (XOPENEX) 1.25 MG/0.5ML nebulizer solution Take 1.25 mg by nebulization every 6 (six) hours. 30 each 12  . lidocaine (LIDODERM) 5 % Place 1 patch onto the skin daily. Remove & Discard patch within 12 hours or as directed by MD 10 patch 0  . loratadine (CLARITIN) 10 MG tablet Take 1 tablet (10 mg total) daily by mouth. 90 tablet 3  . metoprolol tartrate (LOPRESSOR) 25 MG tablet Take 0.5 tablets (12.5 mg total) by mouth 2 (two) times daily. 30 tablet 1  . pantoprazole (PROTONIX) 40 MG tablet Take 1 tablet (40 mg total) by mouth daily. 30 tablet 1  . polyethylene glycol (MIRALAX / GLYCOLAX) packet Take 17 g by mouth 2 (two) times daily. 14 each 0  . predniSONE (DELTASONE) 20 MG tablet Take 1 tablet (20 mg total) by mouth daily with breakfast. 5 tablet 0  . senna-docusate (SENOKOT-S) 8.6-50 MG tablet Take 1 tablet by mouth 2 (two) times daily. 120 tablet 1  . sertraline (ZOLOFT) 50 MG tablet Take 1 tablet (50 mg total) by mouth daily. 30 tablet 2  . tiZANidine (ZANAFLEX) 4 MG tablet TAKE 1 TABLET(4 MG) BY MOUTH EVERY 6 HOURS AS NEEDED FOR MUSCLE SPASMS 360 tablet 1  . budesonide (PULMICORT) 0.5 MG/2ML nebulizer solution Take 2 mLs (0.5 mg total) by nebulization 2 (two) times daily. 120 mL 0   No facility-administered medications prior to visit.       Review of Systems  Review of Systems  Constitutional: Positive for chills, fatigue and fever.  Respiratory: Positive for cough, shortness of breath and wheezing.   Gastrointestinal: Positive for vomiting.  Genitourinary: Positive for vaginal discharge.  Psychiatric/Behavioral: Positive for behavioral problems.     Physical Exam  BP 120/70 (BP Location: Left Arm, Cuff Size: Normal)   Pulse (!) 110   Temp (!) 101.9 F (38.8 C) (Oral)   Ht 5' 2.5" (1.588 m)   Wt 86 lb (39 kg)   SpO2 91%   BMI 15.48 kg/m  Physical Exam  Cardiovascular: Regular rhythm. Tachycardia present.  Pulmonary/Chest:  Accessory muscle usage present. Tachypnea noted. She is in respiratory distress. She has rhonchi in the right upper field, the right lower field, the left upper field and the left lower field.     Lab Results:  CBC    Component Value Date/Time   WBC 5.9 05/03/2018 1201   RBC 3.94 05/03/2018 1201   HGB 11.8 (L) 05/03/2018 1201   HCT 35.9 (L) 05/03/2018 1201   PLT 413.0 (H) 05/03/2018 1201   MCV 91.1 05/03/2018 1201   MCH 29.6 04/21/2018 0527   MCHC 33.0 05/03/2018 1201   RDW 15.4 05/03/2018 1201   LYMPHSABS 0.8 05/03/2018 1201   MONOABS 0.7 05/03/2018 1201   EOSABS 0.0 05/03/2018 1201   BASOSABS 0.1 05/03/2018 1201    BMET    Component Value Date/Time   NA 135 05/03/2018 1201  K 4.2 05/03/2018 1201   CL 90 (L) 05/03/2018 1201   CO2 35 (H) 05/03/2018 1201   GLUCOSE 71 05/03/2018 1201   BUN 15 05/03/2018 1201   CREATININE 0.66 05/03/2018 1201   CALCIUM 8.7 05/03/2018 1201   GFRNONAA >60 04/21/2018 0527   GFRAA >60 04/21/2018 0527    BNP    Component Value Date/Time   BNP 184.2 (H) 02/12/2018 2052    ProBNP No results found for: PROBNP  Imaging: Dg Chest 2 View  Result Date: 05/03/2018 CLINICAL DATA:  Pneumonia. Cough and chills and fever and body aches. Emphysema. EXAM: CHEST - 2 VIEW COMPARISON:  04/18/2018 and 04/16/2018 FINDINGS: There has been complete clearing of the infiltrate at the left lung base. No acute infiltrates or effusions. Hyperinflation of the lungs with flattening of the diaphragm consistent with emphysema. New peribronchial thickening consistent with bronchitis. Pulmonary arteries are less prominent than on the prior study. Heart size is normal. No acute bone abnormality. IMPRESSION: 1. Complete clearing of the infiltrate at the left lung base. 2. Severe emphysema. 3. Bronchitic changes. Electronically Signed   By: [name] M.D.   On: 05/03/2018 12:04   Dg Chest 2 View  Result Date: 04/18/2018 CLINICAL DATA:  Chest pain, fever. EXAM: CHEST  - 2 VIEW COMPARISON:  Radiographs of April 16, 2018. FINDINGS: The heart size and mediastinal contours are within normal limits. No pneumothorax is noted. Hyperexpansion of the lungs is noted consistent with chronic obstructive pulmonary disease. No acute abnormality seen in the right lung. Mild left pleural effusion is noted with associated atelectasis or infiltrate. The visualized skeletal structures are unremarkable. IMPRESSION: Mild left basilar pneumonia or atelectasis is noted with associated pleural effusion. Followup PA and lateral chest X-ray is recommended in 3-4 weeks following trial of antibiotic therapy to ensure resolution and exclude underlying malignancy. Hyperexpansion of the lungs is noted consistent with chronic obstructive pulmonary disease. Electronically Signed   By: Marijo Conception, M.D.   On: 04/18/2018 16:30   Dg Chest 2 View  Result Date: 04/16/2018 CLINICAL DATA:  Upper abdominal and lower chest pain radiating to the back with vomiting. EXAM: CHEST - 2 VIEW COMPARISON:  CT 02/23/2018 and CXR 02/20/2018 FINDINGS: The heart size and mediastinal contours are within normal limits. Nonaneurysmal atherosclerotic aorta. Emphysematous hyperinflation of the lungs with clearing of posterior pleural effusions since prior exam. Nodular densities compatible with nipple shadows are seen bilaterally overlying the mid lung, best seen on the frontal view. These can be confirmed with nipple markers and repeat imaging. Superimposed small moderate-sized hiatal hernia. Osteopenic appearance of the bony thorax without acute nor suspicious osseous appearing abnormalities. IMPRESSION: 1. Emphysematous hyperinflation of the lungs compatible COPD. No active pulmonary disease. 2. Small to moderate-sized hiatal hernia. 3. Clearing of bilateral posterior pleural effusions since prior. 4. Nodular densities seen at the same level bilaterally overlying the mid lung compatible with nipple shadows. These can be  confirmed with repeat imaging and nipple markers in place to exclude pulmonary nodules. Electronically Signed   By: Ashley Royalty M.D.   On: 04/16/2018 15:57   Dg Ribs Unilateral Left  Result Date: 04/18/2018 CLINICAL DATA:  Acute left rib pain.  Fever. EXAM: LEFT RIBS - 2 VIEW COMPARISON:  Radiographs of April 16, 2018. FINDINGS: No fracture or other bone lesions are seen involving the ribs. IMPRESSION: Normal left ribs. Electronically Signed   By: Marijo Conception, M.D.   On: 04/18/2018 16:28   Dg Esophagus  Result Date: 04/17/2018 CLINICAL DATA:  Regurgitation of food, epigastric pain. EXAM: ESOPHOGRAM/BARIUM SWALLOW TECHNIQUE: Single contrast examination was performed using  thin barium. FLUOROSCOPY TIME:  Fluoroscopy Time:  2 minutes 16 seconds Radiation Exposure Index (if provided by the fluoroscopic device): Number of Acquired Spot Images: 0 COMPARISON:  None FINDINGS: Fluoroscopic evaluation of swallowing demonstrates disruption of primary esophageal peristaltic waves with stasis. 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 There is stasis of contrast within the diverticulum. Large hiatal hernia noted. No fixed esophageal stricture. 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 Large hiatal hernia with stasis of contrast. 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 Electronically Signed   By: Rolm Baptise M.D.   On: 04/17/2018 10:45     Assessment & Plan:   Fever New; Likely d/t aspiration. CXR showing complete clearing of left side infiltrate, chronic emphysema and bronchitic changes. Plan levaquin x 7 days, tylenol prn, encourage oral fluids and deep breathing exercises. FU in office on Monday.   Chronic respiratory failure with hypoxia ([place]) Recent hosp admission in June and treated for left side PNA with cefpodoxime and  doxycycline.  Complains of fever and cough x3 days. Appears well in office. Breathing is not labored and no resp distress noted. Requiring 3L O2. CXR obtained showing complete clearing of recent left sided PNA, severe emphysema and bronchitic changes.   Will treat for suspected aspiration vs bronchitis with levaquin x7 days and follow-up in 5 days. Instructed to go to ED if symptoms worsen d/t holiday weekend.   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   Patient reports eating chicken wings and fries 2 days ago and vomiting, likely aspirated. Strongly encourage soft solid diet with strict aspiration precautions.      Martyn Ehrich, NP 05/03/2018

## 2018-05-04 DIAGNOSIS — J441 Chronic obstructive pulmonary disease with (acute) exacerbation: Secondary | ICD-10-CM | POA: Diagnosis not present

## 2018-05-04 DIAGNOSIS — I110 Hypertensive heart disease with heart failure: Secondary | ICD-10-CM | POA: Diagnosis not present

## 2018-05-04 DIAGNOSIS — I2510 Atherosclerotic heart disease of native coronary artery without angina pectoris: Secondary | ICD-10-CM | POA: Diagnosis not present

## 2018-05-04 DIAGNOSIS — I5030 Unspecified diastolic (congestive) heart failure: Secondary | ICD-10-CM | POA: Diagnosis not present

## 2018-05-04 DIAGNOSIS — J189 Pneumonia, unspecified organism: Secondary | ICD-10-CM | POA: Diagnosis not present

## 2018-05-04 DIAGNOSIS — J440 Chronic obstructive pulmonary disease with acute lower respiratory infection: Secondary | ICD-10-CM | POA: Diagnosis not present

## 2018-05-05 ENCOUNTER — Telehealth: Payer: Self-pay | Admitting: Primary Care

## 2018-05-05 MED ORDER — AZITHROMYCIN 250 MG PO TABS: ORAL_TABLET | ORAL | 0 refills | Status: DC

## 2018-05-05 NOTE — Telephone Encounter (Signed)
Called pt back but still unable to reach pt. Left message for pt to return call and specified in the message to have her tell the front staff to keep her on the phone and transfer her directly to triage due to having problems with the phone line.

## 2018-05-05 NOTE — Telephone Encounter (Signed)
Pt is returning call. Cb is [phone].

## 2018-05-05 NOTE — Telephone Encounter (Signed)
Patient returned phone call. °

## 2018-05-05 NOTE — Telephone Encounter (Signed)
LMOMTCB x 1 

## 2018-05-05 NOTE — Telephone Encounter (Signed)
Called and spoke to pt.  Pt is concerned about side effects with Levaquin.  Pt stated she had not started Levaquin. Pt stated with her past medical hx and weight, she is concerned about cardiac side effects per warning label on bottle. Pt stated she is typically prescribed Zpak.    MW please advise, as RB is unavailable.

## 2018-05-05 NOTE — Telephone Encounter (Signed)
Pt is returning call. Phone disconnected before call could be transferred. Cb is [phone].

## 2018-05-05 NOTE — Telephone Encounter (Signed)
Called pt but unable to reach her.  Left message for pt to return call x1

## 2018-05-05 NOTE — Telephone Encounter (Signed)
Fine to change to zpak

## 2018-05-05 NOTE — Telephone Encounter (Signed)
Called spoke with patient Advised of MW's recommendations as stated below Pt voiced her understanding Rx changed to zpak Pt aware to contact the office if symptoms do not improve or they worsen  Nothing further needed; will sign off

## 2018-05-08 ENCOUNTER — Ambulatory Visit: Payer: Medicare Other | Admitting: Primary Care

## 2018-05-09 DIAGNOSIS — F329 Major depressive disorder, single episode, unspecified: Secondary | ICD-10-CM

## 2018-05-09 DIAGNOSIS — J189 Pneumonia, unspecified organism: Secondary | ICD-10-CM | POA: Diagnosis not present

## 2018-05-09 DIAGNOSIS — Z7982 Long term (current) use of aspirin: Secondary | ICD-10-CM

## 2018-05-09 DIAGNOSIS — J441 Chronic obstructive pulmonary disease with (acute) exacerbation: Secondary | ICD-10-CM | POA: Diagnosis not present

## 2018-05-09 DIAGNOSIS — Z7951 Long term (current) use of inhaled steroids: Secondary | ICD-10-CM

## 2018-05-09 DIAGNOSIS — J9602 Acute respiratory failure with hypercapnia: Secondary | ICD-10-CM | POA: Diagnosis not present

## 2018-05-09 DIAGNOSIS — F4310 Post-traumatic stress disorder, unspecified: Secondary | ICD-10-CM

## 2018-05-09 DIAGNOSIS — I2510 Atherosclerotic heart disease of native coronary artery without angina pectoris: Secondary | ICD-10-CM | POA: Diagnosis not present

## 2018-05-09 DIAGNOSIS — Z8701 Personal history of pneumonia (recurrent): Secondary | ICD-10-CM | POA: Diagnosis not present

## 2018-05-09 DIAGNOSIS — R1310 Dysphagia, unspecified: Secondary | ICD-10-CM | POA: Diagnosis not present

## 2018-05-09 DIAGNOSIS — I5030 Unspecified diastolic (congestive) heart failure: Secondary | ICD-10-CM | POA: Diagnosis not present

## 2018-05-09 DIAGNOSIS — K219 Gastro-esophageal reflux disease without esophagitis: Secondary | ICD-10-CM

## 2018-05-09 DIAGNOSIS — D649 Anemia, unspecified: Secondary | ICD-10-CM | POA: Diagnosis not present

## 2018-05-09 DIAGNOSIS — J9601 Acute respiratory failure with hypoxia: Secondary | ICD-10-CM | POA: Diagnosis not present

## 2018-05-09 DIAGNOSIS — I110 Hypertensive heart disease with heart failure: Secondary | ICD-10-CM | POA: Diagnosis not present

## 2018-05-09 DIAGNOSIS — E43 Unspecified severe protein-calorie malnutrition: Secondary | ICD-10-CM | POA: Diagnosis not present

## 2018-05-09 DIAGNOSIS — Z87891 Personal history of nicotine dependence: Secondary | ICD-10-CM

## 2018-05-09 DIAGNOSIS — R918 Other nonspecific abnormal finding of lung field: Secondary | ICD-10-CM

## 2018-05-09 DIAGNOSIS — J440 Chronic obstructive pulmonary disease with acute lower respiratory infection: Secondary | ICD-10-CM | POA: Diagnosis not present

## 2018-05-12 DIAGNOSIS — I110 Hypertensive heart disease with heart failure: Secondary | ICD-10-CM | POA: Diagnosis not present

## 2018-05-12 DIAGNOSIS — J441 Chronic obstructive pulmonary disease with (acute) exacerbation: Secondary | ICD-10-CM | POA: Diagnosis not present

## 2018-05-12 DIAGNOSIS — J189 Pneumonia, unspecified organism: Secondary | ICD-10-CM | POA: Diagnosis not present

## 2018-05-12 DIAGNOSIS — I5030 Unspecified diastolic (congestive) heart failure: Secondary | ICD-10-CM | POA: Diagnosis not present

## 2018-05-12 DIAGNOSIS — I2510 Atherosclerotic heart disease of native coronary artery without angina pectoris: Secondary | ICD-10-CM | POA: Diagnosis not present

## 2018-05-12 DIAGNOSIS — J440 Chronic obstructive pulmonary disease with acute lower respiratory infection: Secondary | ICD-10-CM | POA: Diagnosis not present

## 2018-05-16 DIAGNOSIS — J440 Chronic obstructive pulmonary disease with acute lower respiratory infection: Secondary | ICD-10-CM | POA: Diagnosis not present

## 2018-05-16 DIAGNOSIS — I5030 Unspecified diastolic (congestive) heart failure: Secondary | ICD-10-CM | POA: Diagnosis not present

## 2018-05-16 DIAGNOSIS — I2510 Atherosclerotic heart disease of native coronary artery without angina pectoris: Secondary | ICD-10-CM | POA: Diagnosis not present

## 2018-05-16 DIAGNOSIS — J189 Pneumonia, unspecified organism: Secondary | ICD-10-CM | POA: Diagnosis not present

## 2018-05-16 DIAGNOSIS — I110 Hypertensive heart disease with heart failure: Secondary | ICD-10-CM | POA: Diagnosis not present

## 2018-05-16 DIAGNOSIS — J441 Chronic obstructive pulmonary disease with (acute) exacerbation: Secondary | ICD-10-CM | POA: Diagnosis not present

## 2018-05-17 ENCOUNTER — Telehealth: Payer: Self-pay | Admitting: Emergency Medicine

## 2018-05-17 MED ORDER — AZITHROMYCIN 250 MG PO TABS: ORAL_TABLET | ORAL | 0 refills | Status: AC

## 2018-05-17 MED ORDER — PREDNISONE 10 MG PO TABS: ORAL_TABLET | ORAL | 0 refills | Status: DC

## 2018-05-17 NOTE — Telephone Encounter (Signed)
Spoke with pt. She is aware of RB's recommendations. Rxs have been sent in. Nothing further was needed.

## 2018-05-17 NOTE — Telephone Encounter (Signed)
Spoke with pt. She states that she is not feeling well. Reports cough, chest congestion and SOB. Cough is producing clear mucus. Denies chest tightness, wheezing or fever. Declined appointment due to "her family needing her." She is requesting a prednisone taper.  RB - please advise. Thanks.

## 2018-05-17 NOTE — Telephone Encounter (Signed)
Ok to treat with pred: Take 40mg  daily for 3 days, then 30mg  daily for 3 days, then 20mg  daily for 3 days, then 10mg  daily for 3 days, then stop Also azithro z-pack.  She needs to call if failing to improve

## 2018-05-18 DIAGNOSIS — I2510 Atherosclerotic heart disease of native coronary artery without angina pectoris: Secondary | ICD-10-CM | POA: Diagnosis not present

## 2018-05-18 DIAGNOSIS — I110 Hypertensive heart disease with heart failure: Secondary | ICD-10-CM | POA: Diagnosis not present

## 2018-05-18 DIAGNOSIS — J441 Chronic obstructive pulmonary disease with (acute) exacerbation: Secondary | ICD-10-CM | POA: Diagnosis not present

## 2018-05-18 DIAGNOSIS — J440 Chronic obstructive pulmonary disease with acute lower respiratory infection: Secondary | ICD-10-CM | POA: Diagnosis not present

## 2018-05-18 DIAGNOSIS — I5030 Unspecified diastolic (congestive) heart failure: Secondary | ICD-10-CM | POA: Diagnosis not present

## 2018-05-18 DIAGNOSIS — J189 Pneumonia, unspecified organism: Secondary | ICD-10-CM | POA: Diagnosis not present

## 2018-05-24 ENCOUNTER — Ambulatory Visit: Payer: Medicare Other | Admitting: Physician Assistant

## 2018-05-25 ENCOUNTER — Ambulatory Visit: Payer: Medicare Other | Admitting: Emergency Medicine

## 2018-06-08 ENCOUNTER — Telehealth: Payer: Self-pay | Admitting: Emergency Medicine

## 2018-06-08 MED ORDER — PREDNISONE 10 MG PO TABS
ORAL_TABLET | ORAL | 0 refills | Status: DC
Start: 2018-06-08 — End: 2018-11-04

## 2018-06-08 MED ORDER — AZITHROMYCIN 250 MG PO TABS: ORAL_TABLET | ORAL | 0 refills | Status: DC

## 2018-06-08 NOTE — Telephone Encounter (Signed)
Spoke with pt. She is aware of CY's recommendation. Rxs have been sent in. Nothing further was needed. 

## 2018-06-08 NOTE — Telephone Encounter (Signed)
Pt c/o sob, chest tightness, prod cough with clear to white mucus X3 weeks.  Pt was given a pred taper and zpak on 7/17 for these same symptoms.  States this has improved her s/s but is still present.  Pt is requesting a refill on both zpak and pred taper. Pharmacy: [facility] on [address].   Sending to DOD as RB is off today.   CY please advise if ok to refill.  Thanks.

## 2018-06-08 NOTE — Telephone Encounter (Signed)
Ok to refill the Zpak and prednisone taper she got on 7/17

## 2018-06-14 DIAGNOSIS — J189 Pneumonia, unspecified organism: Secondary | ICD-10-CM | POA: Diagnosis not present

## 2018-06-14 DIAGNOSIS — J440 Chronic obstructive pulmonary disease with acute lower respiratory infection: Secondary | ICD-10-CM | POA: Diagnosis not present

## 2018-06-14 DIAGNOSIS — I5030 Unspecified diastolic (congestive) heart failure: Secondary | ICD-10-CM | POA: Diagnosis not present

## 2018-06-14 DIAGNOSIS — I2510 Atherosclerotic heart disease of native coronary artery without angina pectoris: Secondary | ICD-10-CM | POA: Diagnosis not present

## 2018-06-14 DIAGNOSIS — I110 Hypertensive heart disease with heart failure: Secondary | ICD-10-CM | POA: Diagnosis not present

## 2018-06-14 DIAGNOSIS — J441 Chronic obstructive pulmonary disease with (acute) exacerbation: Secondary | ICD-10-CM | POA: Diagnosis not present

## 2018-06-23 DIAGNOSIS — J440 Chronic obstructive pulmonary disease with acute lower respiratory infection: Secondary | ICD-10-CM | POA: Diagnosis not present

## 2018-06-23 DIAGNOSIS — I2510 Atherosclerotic heart disease of native coronary artery without angina pectoris: Secondary | ICD-10-CM | POA: Diagnosis not present

## 2018-06-23 DIAGNOSIS — I5030 Unspecified diastolic (congestive) heart failure: Secondary | ICD-10-CM | POA: Diagnosis not present

## 2018-06-23 DIAGNOSIS — J441 Chronic obstructive pulmonary disease with (acute) exacerbation: Secondary | ICD-10-CM | POA: Diagnosis not present

## 2018-06-23 DIAGNOSIS — I110 Hypertensive heart disease with heart failure: Secondary | ICD-10-CM | POA: Diagnosis not present

## 2018-06-23 DIAGNOSIS — J189 Pneumonia, unspecified organism: Secondary | ICD-10-CM | POA: Diagnosis not present

## 2018-06-28 DIAGNOSIS — J441 Chronic obstructive pulmonary disease with (acute) exacerbation: Secondary | ICD-10-CM | POA: Diagnosis not present

## 2018-06-28 DIAGNOSIS — I110 Hypertensive heart disease with heart failure: Secondary | ICD-10-CM | POA: Diagnosis not present

## 2018-06-28 DIAGNOSIS — I5030 Unspecified diastolic (congestive) heart failure: Secondary | ICD-10-CM | POA: Diagnosis not present

## 2018-06-28 DIAGNOSIS — J440 Chronic obstructive pulmonary disease with acute lower respiratory infection: Secondary | ICD-10-CM | POA: Diagnosis not present

## 2018-06-28 DIAGNOSIS — I2510 Atherosclerotic heart disease of native coronary artery without angina pectoris: Secondary | ICD-10-CM | POA: Diagnosis not present

## 2018-06-28 DIAGNOSIS — J189 Pneumonia, unspecified organism: Secondary | ICD-10-CM | POA: Diagnosis not present

## 2018-06-29 DIAGNOSIS — F4310 Post-traumatic stress disorder, unspecified: Secondary | ICD-10-CM | POA: Diagnosis not present

## 2018-06-29 DIAGNOSIS — I110 Hypertensive heart disease with heart failure: Secondary | ICD-10-CM | POA: Diagnosis not present

## 2018-06-29 DIAGNOSIS — R918 Other nonspecific abnormal finding of lung field: Secondary | ICD-10-CM | POA: Diagnosis not present

## 2018-06-29 DIAGNOSIS — E43 Unspecified severe protein-calorie malnutrition: Secondary | ICD-10-CM | POA: Diagnosis not present

## 2018-06-29 DIAGNOSIS — R1310 Dysphagia, unspecified: Secondary | ICD-10-CM | POA: Diagnosis not present

## 2018-06-29 DIAGNOSIS — J441 Chronic obstructive pulmonary disease with (acute) exacerbation: Secondary | ICD-10-CM | POA: Diagnosis not present

## 2018-06-29 DIAGNOSIS — Z7951 Long term (current) use of inhaled steroids: Secondary | ICD-10-CM | POA: Diagnosis not present

## 2018-06-29 DIAGNOSIS — K219 Gastro-esophageal reflux disease without esophagitis: Secondary | ICD-10-CM | POA: Diagnosis not present

## 2018-06-29 DIAGNOSIS — Z7982 Long term (current) use of aspirin: Secondary | ICD-10-CM | POA: Diagnosis not present

## 2018-06-29 DIAGNOSIS — D649 Anemia, unspecified: Secondary | ICD-10-CM | POA: Diagnosis not present

## 2018-06-29 DIAGNOSIS — I5030 Unspecified diastolic (congestive) heart failure: Secondary | ICD-10-CM | POA: Diagnosis not present

## 2018-06-29 DIAGNOSIS — Z8701 Personal history of pneumonia (recurrent): Secondary | ICD-10-CM | POA: Diagnosis not present

## 2018-06-29 DIAGNOSIS — F329 Major depressive disorder, single episode, unspecified: Secondary | ICD-10-CM | POA: Diagnosis not present

## 2018-06-29 DIAGNOSIS — J9601 Acute respiratory failure with hypoxia: Secondary | ICD-10-CM | POA: Diagnosis not present

## 2018-06-29 DIAGNOSIS — J9602 Acute respiratory failure with hypercapnia: Secondary | ICD-10-CM | POA: Diagnosis not present

## 2018-06-29 DIAGNOSIS — I2510 Atherosclerotic heart disease of native coronary artery without angina pectoris: Secondary | ICD-10-CM | POA: Diagnosis not present

## 2018-06-29 DIAGNOSIS — Z87891 Personal history of nicotine dependence: Secondary | ICD-10-CM | POA: Diagnosis not present

## 2018-07-13 ENCOUNTER — Telehealth: Payer: Self-pay | Admitting: Emergency Medicine

## 2018-07-13 DIAGNOSIS — I2510 Atherosclerotic heart disease of native coronary artery without angina pectoris: Secondary | ICD-10-CM | POA: Diagnosis not present

## 2018-07-13 DIAGNOSIS — I110 Hypertensive heart disease with heart failure: Secondary | ICD-10-CM | POA: Diagnosis not present

## 2018-07-13 DIAGNOSIS — E43 Unspecified severe protein-calorie malnutrition: Secondary | ICD-10-CM | POA: Diagnosis not present

## 2018-07-13 DIAGNOSIS — I5030 Unspecified diastolic (congestive) heart failure: Secondary | ICD-10-CM | POA: Diagnosis not present

## 2018-07-13 DIAGNOSIS — R1310 Dysphagia, unspecified: Secondary | ICD-10-CM | POA: Diagnosis not present

## 2018-07-13 DIAGNOSIS — J441 Chronic obstructive pulmonary disease with (acute) exacerbation: Secondary | ICD-10-CM | POA: Diagnosis not present

## 2018-07-17 ENCOUNTER — Ambulatory Visit: Payer: Medicare Other | Admitting: Nurse Practitioner

## 2018-07-17 NOTE — Telephone Encounter (Signed)
Patient concerns has been addressed.

## 2018-07-20 ENCOUNTER — Telehealth: Payer: Self-pay | Admitting: Internal Medicine

## 2018-07-20 DIAGNOSIS — E43 Unspecified severe protein-calorie malnutrition: Secondary | ICD-10-CM | POA: Diagnosis not present

## 2018-07-20 DIAGNOSIS — I110 Hypertensive heart disease with heart failure: Secondary | ICD-10-CM | POA: Diagnosis not present

## 2018-07-20 DIAGNOSIS — I2510 Atherosclerotic heart disease of native coronary artery without angina pectoris: Secondary | ICD-10-CM | POA: Diagnosis not present

## 2018-07-20 DIAGNOSIS — R1310 Dysphagia, unspecified: Secondary | ICD-10-CM | POA: Diagnosis not present

## 2018-07-20 DIAGNOSIS — J441 Chronic obstructive pulmonary disease with (acute) exacerbation: Secondary | ICD-10-CM | POA: Diagnosis not present

## 2018-07-20 DIAGNOSIS — I5030 Unspecified diastolic (congestive) heart failure: Secondary | ICD-10-CM | POA: Diagnosis not present

## 2018-07-20 NOTE — Telephone Encounter (Signed)
Copied from [place] [id number]. Topic: Quick Communication - See Telephone Encounter >> Jul 20, 2018  4:57 PM Blase Mess A wrote: CRM for notification. See Telephone encounter for: 07/20/18. [facility] is calling to report a fall of the patient. Her vital signs are stable.  Her pain is a 9 out of 10.  Fraser Din is requesting an xray.  She was in home she got light headed she fell on her bottom. No bruising.  Extremely sore.  Please advise PEC did schedule appt.

## 2018-07-21 ENCOUNTER — Ambulatory Visit: Payer: Medicare Other | Admitting: Internal Medicine

## 2018-07-21 NOTE — Telephone Encounter (Signed)
Patient had appt today but cancelled due to grand kids being sick.  Patient has rescheduled to 9/23.  Kim's phone number was not given so I could not call to update.

## 2018-07-21 NOTE — Telephone Encounter (Signed)
With pain that severe, she should be seen to determine which exact xrays she would need; needs ROV

## 2018-07-24 ENCOUNTER — Ambulatory Visit: Payer: Medicare Other | Admitting: Internal Medicine

## 2018-07-25 ENCOUNTER — Ambulatory Visit (INDEPENDENT_AMBULATORY_CARE_PROVIDER_SITE_OTHER)
Admission: RE | Admit: 2018-07-25 | Discharge: 2018-07-25 | Disposition: A | Discharge status: Home / Self Care | Payer: Medicare Other | Source: Ambulatory Visit | Attending: Internal Medicine | Admitting: Internal Medicine

## 2018-07-25 ENCOUNTER — Encounter: Payer: Self-pay | Admitting: Internal Medicine

## 2018-07-25 ENCOUNTER — Ambulatory Visit (INDEPENDENT_AMBULATORY_CARE_PROVIDER_SITE_OTHER): Payer: Medicare Other | Admitting: Internal Medicine

## 2018-07-25 VITALS — BP 130/80 | HR 123 | Ht 62.5 in | Wt 98.1 lb

## 2018-07-25 DIAGNOSIS — M545 Low back pain, unspecified: Secondary | ICD-10-CM

## 2018-07-25 DIAGNOSIS — M546 Pain in thoracic spine: Secondary | ICD-10-CM | POA: Insufficient documentation

## 2018-07-25 DIAGNOSIS — Z23 Encounter for immunization: Secondary | ICD-10-CM

## 2018-07-25 DIAGNOSIS — M533 Sacrococcygeal disorders, not elsewhere classified: Secondary | ICD-10-CM | POA: Diagnosis not present

## 2018-07-25 DIAGNOSIS — S3992XA Unspecified injury of lower back, initial encounter: Secondary | ICD-10-CM | POA: Diagnosis not present

## 2018-07-25 DIAGNOSIS — R1310 Dysphagia, unspecified: Secondary | ICD-10-CM | POA: Diagnosis not present

## 2018-07-25 DIAGNOSIS — J439 Emphysema, unspecified: Secondary | ICD-10-CM | POA: Diagnosis not present

## 2018-07-25 DIAGNOSIS — R1319 Other dysphagia: Secondary | ICD-10-CM

## 2018-07-25 DIAGNOSIS — S299XXA Unspecified injury of thorax, initial encounter: Secondary | ICD-10-CM | POA: Diagnosis not present

## 2018-07-25 MED ORDER — HYDROCODONE-ACETAMINOPHEN 7.5-325 MG PO TABS: 1.0000 | ORAL_TABLET | Freq: Four times a day (QID) | ORAL | 0 refills | Status: DC | PRN

## 2018-07-25 NOTE — Assessment & Plan Note (Addendum)
Mid midline thoracic tender, cant r/o t-spine fx, for plain films, consider MRI  Note:  Total time for pt hx, exam, review of record with pt in the room, determination of diagnoses and plan for further eval and tx is > 40 min, with over 50% spent in coordination and counseling of patient including the differential dx, tx, further evaluation and other management of thoracic back pain, lumbar pain, coccyx pain, copd and dysphagia

## 2018-07-25 NOTE — Assessment & Plan Note (Signed)
Very mild only midline tender, cant r/o ls-spine fx, for plain films, consider MRI

## 2018-07-25 NOTE — Progress Notes (Signed)
Subjective:    Patient ID: Megan [name], female    DOB: July 19, 1951, 67 y.o.   MRN: [id number]  HPI 67 y.o.femalePMH COPD, chronic hypoxic respiratory failure on oxygen, essential hypertension, esophageal problems requiring several episodes of dilatation.  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  Also underwent EGD by GI and food was found in the distal esophagus in June 2019  She was to follow-up with gastroenterology as an outpatient on May 01 2018 for esophageal manometry.  Was to have outpatient esophageal manometry at [facility] read by VN. GI office appt about 2 weeks after esophageal mano with APP or Dr Fuller Plan.  Pt states "I'm juts not ready" to follow through with these recommendations, was apparently traumatized to some degree with tx with what sounds like NG tube placement at one time.  BP Readings from Last 3 Encounters:  07/25/18 130/80  05/03/18 120/70  04/21/18 (!) 104/48   Wt Readings from Last 3 Encounters:  07/25/18 98 lb 2 oz (44.5 kg)  05/03/18 86 lb (39 kg)  04/18/18 87 lb (39.5 kg)  She was also found to have left-sided pneumonia.  She was a started treatment for possible healthcare associated  pneumonia versus aspiration pneumonia and resolved.  Was to f/u here at 1 wk but not able. Here today with home o2 in place 2L (though she is actually at 3L today until she gets home per pt);  Pt denies chest pain, increased sob or doe, wheezing, orthopnea, PND, increased LE swelling, palpitations, dizziness or syncope. Pt here due to fall 3 days ago x 1, recalls getting some off balance in her room and just went down, no prodromal symptoms, but fell down hard to buttocks and jarred her to the neck.  Helped up by a family member up on the bed.  Was able to ambulate after but has severe pain with certain movements  Such as even reaching for something or twisting a the waist,  pain severe , constant, sharp and dull sometimes like burning red hot poker., but no bowel or bladder change, worsening LE pain/numbness/weakness, gait change or falls.  Not better with aspercreme and ES tylenol. Recalls June 2019 hydrocodone 5 325 did not help much.    No longer taking prednisone - tapered herself off.  Declines DXA for now.   Past Medical History:  Diagnosis Date  . Anemia   . Anxiety state 08/20/2015  . CAP (community acquired pneumonia)   . CHF (congestive heart failure) ([place])   . COPD (chronic obstructive pulmonary disease) ([place])   . Depression   . Essential hypertension 08/19/2015  . GERD (gastroesophageal reflux disease)   . Headache   . History of hiatal hernia   . Myocardial infarction ([place])   . Shortness of breath dyspnea    Past Surgical History:  Procedure Laterality Date  . CARDIAC CATHETERIZATION    . CARDIAC CATHETERIZATION N/A 08/26/2015   Procedure: Left Heart Cath and Coronary Angiography;  Surgeon: Jettie Booze, MD;  Location: [place] [facility];  Service: Cardiovascular;  Laterality: N/A;  . ESOPHAGOGASTRODUODENOSCOPY N/A 04/18/2018   Procedure: ESOPHAGOGASTRODUODENOSCOPY (EGD);  Surgeon: Ladene Artist, MD;  Location: [facility] ENDOSCOPY;  Service: Endoscopy;  Laterality: N/A;  . tracheostomy      reports that she quit smoking about 17 years ago. Her smoking use included cigarettes. She has a 30.00 pack-year smoking history. She has never  used smokeless tobacco. She reports that she does not drink alcohol or use drugs. family history includes Cirrhosis in her mother; Depression in her mother; Heart disease in her father. Allergies  Allergen Reactions  . Penicillins Rash and Hives    Has patient had a PCN reaction causing immediate rash, facial/tongue/throat swelling, SOB or lightheadedness with hypotension: No Has patient had a PCN reaction causing severe rash involving mucus membranes or skin necrosis:NO Has patient had a PCN reaction that  required hospitalization No Has patient had a PCN reaction occurring within the last 10 years:NO If all of the above answers are "NO", then may proceed with Cephalosporin use.  . Fentanyl And Related Other (See Comments)    Behavioral changes per daughter   Current Outpatient Medications on File Prior to Visit  Medication Sig Dispense Refill  . albuterol (PROVENTIL HFA;VENTOLIN HFA) 108 (90 Base) MCG/ACT inhaler Inhale 2 puffs into the lungs every 6 (six) hours as needed for wheezing or shortness of breath. Insurance preference 1 Inhaler 5  . ALPRAZolam (XANAX) 0.25 MG tablet TAKE 1 TABLET(0.25 MG) BY MOUTH TWICE DAILY AS NEEDED FOR ANXIETY 60 tablet 2  . aspirin EC 81 MG tablet Take 81 mg by mouth daily.    . butalbital-acetaminophen-caffeine (FIORICET, ESGIC) 50-325-40 MG tablet Take 1 tablet by mouth 2 (two) times daily as needed for headache. 30 tablet 0  . diltiazem (CARDIZEM CD) 240 MG 24 hr capsule Take 1 capsule (240 mg total) by mouth daily. 30 capsule 3  . feeding supplement, ENSURE ENLIVE, (ENSURE ENLIVE) LIQD Take 237 mLs by mouth 4 (four) times daily. 237 mL 0  . fluticasone (FLONASE) 50 MCG/ACT nasal spray Place 2 sprays into both nostrils daily. 16 g 6  . Fluticasone-Umeclidin-Vilant (TRELEGY ELLIPTA) 100-62.5-25 MCG/INH AEPB Inhale 1 puff into the lungs daily. 60 each 5  . levalbuterol (XOPENEX) 1.25 MG/0.5ML nebulizer solution Take 1.25 mg by nebulization every 6 (six) hours. 30 each 12  . lidocaine (LIDODERM) 5 % Place 1 patch onto the skin daily. Remove & Discard patch within 12 hours or as directed by MD 10 patch 0  . loratadine (CLARITIN) 10 MG tablet Take 1 tablet (10 mg total) daily by mouth. 90 tablet 3  . metoprolol tartrate (LOPRESSOR) 25 MG tablet Take 0.5 tablets (12.5 mg total) by mouth 2 (two) times daily. 30 tablet 1  . pantoprazole (PROTONIX) 40 MG tablet Take 1 tablet (40 mg total) by mouth daily. 30 tablet 1  . polyethylene glycol (MIRALAX / GLYCOLAX) packet  Take 17 g by mouth 2 (two) times daily. 14 each 0  . predniSONE (DELTASONE) 10 MG tablet Take 4 tablets for 3 days, 3 tablets for 3 days, 2 tablets for 3 days, 1 tablet for 3 days 30 tablet 0  . predniSONE (DELTASONE) 20 MG tablet Take 1 tablet (20 mg total) by mouth daily with breakfast. 5 tablet 0  . senna-docusate (SENOKOT-S) 8.6-50 MG tablet Take 1 tablet by mouth 2 (two) times daily. 120 tablet 1  . sertraline (ZOLOFT) 50 MG tablet Take 1 tablet (50 mg total) by mouth daily. 30 tablet 2  . tiZANidine (ZANAFLEX) 4 MG tablet TAKE 1 TABLET(4 MG) BY MOUTH EVERY 6 HOURS AS NEEDED FOR MUSCLE SPASMS 360 tablet 1  . budesonide (PULMICORT) 0.5 MG/2ML nebulizer solution Take 2 mLs (0.5 mg total) by nebulization 2 (two) times daily. 120 mL 0   No current facility-administered medications on file prior to visit.    Review of Systems  Constitutional: Negative for other unusual diaphoresis or sweats HENT: Negative for ear discharge or swelling Eyes: Negative for other worsening visual disturbances Respiratory: Negative for stridor or other swelling  Gastrointestinal: Negative for worsening distension or other blood Genitourinary: Negative for retention or other urinary change Musculoskeletal: Negative for other MSK pain or swelling Skin: Negative for color change or other new lesions Neurological: Negative for worsening tremors and other numbness  Psychiatric/Behavioral: Negative for worsening agitation or other fatigue All other system neg per pt    Objective:   Physical Exam BP 130/80 (BP Location: Left Arm, Patient Position: Sitting, Cuff Size: Normal)   Pulse (!) 123   Ht 5' 2.5" (1.588 m)   Wt 98 lb 2 oz (44.5 kg)   SpO2 95%   BMI 17.66 kg/m  VS noted,  Constitutional: Pt appears in NAD HENT: Head: NCAT.  Right Ear: External ear normal.  Left Ear: External ear normal.  Eyes: . Pupils are equal, round, and reactive to light. Conjunctivae and EOM are normal Nose: without d/c or  deformity Neck: Neck supple. Gross normal ROM Cardiovascular: Normal rate and regular rhythm.   Pulmonary/Chest: Effort normal and breath sounds decreased without rales or wheezing.  Abd:  Soft, NT, ND, + BS, no organomegaly Spine: tender midline mid thoracic, very little tender to lumbar but severe tender to midline coccyx Neurological: Pt is alert. At baseline orientation, motor grossly intact Skin: Skin is warm. No rashes, other new lesions, no LE edema Psychiatric: Pt behavior is normal without agitation  No other exam findings  Lab Results  Component Value Date   WBC 5.9 05/03/2018   HGB 11.8 (L) 05/03/2018   HCT 35.9 (L) 05/03/2018   PLT 413.0 (H) 05/03/2018   GLUCOSE 71 05/03/2018   ALT 20 04/16/2018   AST 30 04/16/2018   NA 135 05/03/2018   K 4.2 05/03/2018   CL 90 (L) 05/03/2018   CREATININE 0.66 05/03/2018   BUN 15 05/03/2018   CO2 35 (H) 05/03/2018   TSH 1.37 01/04/2018   INR 1.05 03/09/2017   HGBA1C 5.5 01/19/2018      Assessment & Plan:

## 2018-07-25 NOTE — Assessment & Plan Note (Signed)
stable overall by history and exam, recent data reviewed with pt, and pt to continue medical treatment as before,  to f/u any worsening symptoms or concerns  

## 2018-07-25 NOTE — Patient Instructions (Signed)
.  Please take all new medication as prescribed - the pain medication  Please call in 1 week if more pain medication is needed, as we are limited by law to 1 week supply for now  Please continue all other medications as before, and refills have been done if requested.  Please have the pharmacy call with any other refills you may need.  Please keep your appointments with your specialists as you may have planned  Please consider calling to follow up with GI for the swallowing issue  Please go to the XRAY Department in the Basement (go straight as you get off the elevator) for the x-ray testing  You will be contacted by phone if any changes need to be made immediately.  Otherwise, you will receive a letter about your results with an explanation, but please check with MyChart first.  Please remember to sign up for MyChart if you have not done so, as this will be important to you in the future with finding out test results, communicating by private email, and scheduling acute appointments online when needed.  If there is a fracture, you may need to see Sports Medicine or Orthopedic, and consider a Bone Density testing when the pain is improved

## 2018-07-25 NOTE — Assessment & Plan Note (Signed)
Urged pt to f/u with Gi but declines to make appt for now

## 2018-07-25 NOTE — Assessment & Plan Note (Addendum)
Severe midline tender, cant r/o coccyx fx, for plain films, consider MRI, consider MRI, consider sports med or ortho referral, consdier dxa if fracture when pain improved

## 2018-07-26 ENCOUNTER — Telehealth: Payer: Self-pay | Admitting: Internal Medicine

## 2018-07-26 DIAGNOSIS — M545 Low back pain: Principal | ICD-10-CM

## 2018-07-26 DIAGNOSIS — G8929 Other chronic pain: Secondary | ICD-10-CM

## 2018-07-26 NOTE — Telephone Encounter (Signed)
Copied from [place] [id number]. Topic: Quick Communication - See Telephone Encounter >> Jul 26, 2018  3:41 PM Percell Belt A wrote: CRM for notification. See Telephone encounter for: 07/26/18.  Pt called in and stated that her back Is hurting.  She understands there was no fracture but she is having server back pain and not sure what to do.  She would like to know what she need to do next?    Best number  -[phone]

## 2018-07-26 NOTE — Telephone Encounter (Signed)
Patient was seen yesterday 9/24

## 2018-07-27 DIAGNOSIS — I5030 Unspecified diastolic (congestive) heart failure: Secondary | ICD-10-CM | POA: Diagnosis not present

## 2018-07-27 DIAGNOSIS — I110 Hypertensive heart disease with heart failure: Secondary | ICD-10-CM | POA: Diagnosis not present

## 2018-07-27 DIAGNOSIS — J441 Chronic obstructive pulmonary disease with (acute) exacerbation: Secondary | ICD-10-CM | POA: Diagnosis not present

## 2018-07-27 DIAGNOSIS — I2510 Atherosclerotic heart disease of native coronary artery without angina pectoris: Secondary | ICD-10-CM | POA: Diagnosis not present

## 2018-07-27 DIAGNOSIS — E43 Unspecified severe protein-calorie malnutrition: Secondary | ICD-10-CM | POA: Diagnosis not present

## 2018-07-27 DIAGNOSIS — R1310 Dysphagia, unspecified: Secondary | ICD-10-CM | POA: Diagnosis not present

## 2018-07-27 NOTE — Telephone Encounter (Signed)
Notified pt w/MD response. Pt states she doesn't need to be referred out to [employer]. She states there is something going on with her back. Something may be compressed. She is wanting to have more testing done " MRI" that can go deeper than xray.Marland KitchenJohny Chess

## 2018-07-27 NOTE — Telephone Encounter (Signed)
Pt was tx with narcotic at last visit; this was meant to be short term, but I can refer to pain management if she likes, as I do not treat chronic pain

## 2018-07-27 NOTE — Telephone Encounter (Signed)
See below, thanks

## 2018-07-27 NOTE — Telephone Encounter (Signed)
Ok for MRI  - done per [employer]

## 2018-07-28 NOTE — Telephone Encounter (Signed)
Notified pt w/MD response.../lmb 

## 2018-08-04 ENCOUNTER — Telehealth: Payer: Self-pay | Admitting: Emergency Medicine

## 2018-08-04 MED ORDER — PREDNISONE 10 MG PO TABS: ORAL_TABLET | ORAL | 0 refills | Status: DC

## 2018-08-04 NOTE — Telephone Encounter (Signed)
Called and spoke to patient. Advised of Dr. Agustina Caroli recommendations. Patient was very thankful and wanted Rx to be sent to [facility]. Rx sent. Nothing further needed at this time.

## 2018-08-04 NOTE — Telephone Encounter (Signed)
OK to give pred >> Take 40mg  daily for 3 days, then 30mg  daily for 3 days, then 20mg  daily for 3 days, then 10mg  daily for 3 days, then stop

## 2018-08-04 NOTE — Telephone Encounter (Signed)
Called and spoke with pt who stated she has had worsening SOB that has been worse x2 days and she thinks it is due the weather. Pt also states she has tightness in her ribs.  Pt is wanting something prescribed to help with her symptoms and states she believes she might need a prednisone taper to help.  Pt denies fever.  Advised pt we should schedule her for an OV as last OV with Derl Barrow, NP on 05/03/18 pt was to follow up that following Monday which pt has been cancelling all scheduled OVs but pt states at this time she is just wanting something to be prescribed to help with her symptoms as she has issues with transportation.  Dr. Lamonte Sakai, please advise on this for pt. Thanks!

## 2018-08-08 DIAGNOSIS — R1310 Dysphagia, unspecified: Secondary | ICD-10-CM | POA: Diagnosis not present

## 2018-08-08 DIAGNOSIS — E43 Unspecified severe protein-calorie malnutrition: Secondary | ICD-10-CM | POA: Diagnosis not present

## 2018-08-08 DIAGNOSIS — I2510 Atherosclerotic heart disease of native coronary artery without angina pectoris: Secondary | ICD-10-CM | POA: Diagnosis not present

## 2018-08-08 DIAGNOSIS — I5030 Unspecified diastolic (congestive) heart failure: Secondary | ICD-10-CM | POA: Diagnosis not present

## 2018-08-08 DIAGNOSIS — I110 Hypertensive heart disease with heart failure: Secondary | ICD-10-CM | POA: Diagnosis not present

## 2018-08-08 DIAGNOSIS — J441 Chronic obstructive pulmonary disease with (acute) exacerbation: Secondary | ICD-10-CM | POA: Diagnosis not present

## 2018-08-09 ENCOUNTER — Other Ambulatory Visit: Payer: Self-pay | Admitting: Internal Medicine

## 2018-08-09 DIAGNOSIS — F4312 Post-traumatic stress disorder, chronic: Secondary | ICD-10-CM

## 2018-08-09 DIAGNOSIS — M545 Low back pain, unspecified: Secondary | ICD-10-CM

## 2018-08-09 DIAGNOSIS — G8929 Other chronic pain: Secondary | ICD-10-CM

## 2018-08-09 NOTE — Telephone Encounter (Signed)
Copied from [place] [id number]. Topic: Quick Communication - Rx Refill/Question >> Aug 09, 2018  4:44 PM Wynetta Emery, [place] C wrote: Medication:HYDROcodone-acetaminophen ([place]) 7.5-325 MG tablet  and also ALPRAZolam Duanne Moron) 0.25 MG tablet  Has the patient contacted their pharmacy? No   (Agent: If no, request that the patient contact the pharmacy for the refill.) (Agent: If yes, when and what did the pharmacy advise?)  Preferred Pharmacy (with phone number or street name):[id number] - [place], [address] AT [address]: Please be advised that RX refills may take up to 3 business days. We ask that you follow-up with your pharmacy.

## 2018-08-09 NOTE — Telephone Encounter (Signed)
Requested medication (s) are due for refill today: yes  Requested medication (s) are on the active medication list: yes    Last refill: Xanax 04/17/18  #60 2 refills     Norco  07/25/18 #30 0 refills  Future visit scheduled no  Notes to clinic:  Requested Prescriptions  Pending Prescriptions Disp Refills   HYDROcodone-acetaminophen (NORCO) 7.5-325 MG tablet 30 tablet 0    Sig: Take 1 tablet by mouth every 6 (six) hours as needed for moderate pain.     Not Delegated - Analgesics:  Opioid Agonist Combinations Failed - 08/09/2018  5:19 PM      Failed - This refill cannot be delegated      Failed - Urine Drug Screen completed in last 360 days.      Passed - Valid encounter within last 6 months    Recent Outpatient Visits          2 weeks ago Acute midline thoracic back pain   [place] [facility], James W, MD   6 months ago SOB (shortness of breath)   [place], Enid Baas, MD   7 months ago Acute pain of right shoulder   [place], Enid Baas, MD   7 months ago [place], Marvis Repress, [place]   8 months ago Flu-like symptoms   [facility] -Georges Mouse, MD            ALPRAZolam Duanne Moron) 0.25 MG tablet 60 tablet 2     Not Delegated - Psychiatry:  Anxiolytics/Hypnotics Failed - 08/09/2018  5:19 PM      Failed - This refill cannot be delegated      Failed - Urine Drug Screen completed in last 360 days.      Passed - Valid encounter within last 6 months    Recent Outpatient Visits          2 weeks ago Acute midline thoracic back pain   [place] [facility], James W, MD   6 months ago SOB (shortness of breath)   [place], MD   7 months ago Acute pain of right shoulder   [place], Enid Baas, MD   7 months ago  [place], Marvis Repress, FNP   8 months ago Flu-like symptoms   [facility] -Georges Mouse, MD

## 2018-08-10 MED ORDER — HYDROCODONE-ACETAMINOPHEN 7.5-325 MG PO TABS: 1.0000 | ORAL_TABLET | Freq: Four times a day (QID) | ORAL | 0 refills | Status: DC | PRN

## 2018-08-10 MED ORDER — ALPRAZOLAM 0.25 MG PO TABS: ORAL_TABLET | ORAL | 2 refills | Status: DC

## 2018-08-10 NOTE — Telephone Encounter (Signed)
Done erx 

## 2018-08-10 NOTE — Telephone Encounter (Signed)
MD approved and sent electronically to pof../lmb  

## 2018-08-19 ENCOUNTER — Other Ambulatory Visit: Payer: Medicare Other

## 2018-08-25 ENCOUNTER — Other Ambulatory Visit: Payer: Self-pay | Admitting: Emergency Medicine

## 2018-09-08 ENCOUNTER — Telehealth: Payer: Self-pay | Admitting: Emergency Medicine

## 2018-09-08 MED ORDER — PREDNISONE 10 MG PO TABS: ORAL_TABLET | ORAL | 0 refills | Status: DC

## 2018-09-08 MED ORDER — AZITHROMYCIN 250 MG PO TABS: ORAL_TABLET | ORAL | 0 refills | Status: DC

## 2018-09-08 NOTE — Telephone Encounter (Signed)
SPoke with pt, she states SOB and cough with mucus.  She states she has COPD and thinks she has bronchitis.flare and thinks she needs a ZPak. She can usually knock it out to keep her from going to the hospital. She has some chills, denies fever, and has a little sore throat but thinks this may be from mouth breathing. Can we send in a ZPak? Please advise.   [facility] [place]

## 2018-09-08 NOTE — Telephone Encounter (Signed)
She needs an office visit. She has not been seen since 05/2018 , rx prednisone last month .  If she can not absoutely come in , Can have Zpack # 1 , take as directed. W/ food  Please schedule ov next week follow up , if not able to come in for visit for check then no further rx can be sent .  mucinex dm Twice daily  As needed  Cough /congestion  She is on oxygen and high risk for decompensation . Remind her phone medicine is not a good option for her.  Please contact office for sooner follow up if symptoms do not improve or worsen or seek emergency care

## 2018-09-08 NOTE — Telephone Encounter (Signed)
Called and spoke with Patient.  TP recommendations given.  Follow up appointment made with TP, for Monday 09/11/18, at 2pm. Z pack sent in to pharmacy.  Patient is requesting a Prednisone taper.  She stated that she was given a taper last month that seemed to help. Prednisone taper prescribed 08/04/18 for 12 days.  Will route to [name], to advise

## 2018-09-08 NOTE — Telephone Encounter (Addendum)
ATC pt, no answer. Left message for pt to call back.  Rx was sent to the pharmacy since it was verified just in case pt cannot get through since it is closing time.

## 2018-09-08 NOTE — Telephone Encounter (Signed)
Prednisone taper  Prednisone 10mg  4 tabs for 2 days, then 3 tabs for 2 days, 2 tabs for 2 days, then 1 tab for 2 days, then stop #20 , no refills   Please contact office for sooner follow up if symptoms do not improve or worsen or seek emergency care

## 2018-09-11 ENCOUNTER — Ambulatory Visit: Payer: Medicare Other | Admitting: Adult Health

## 2018-09-11 NOTE — Telephone Encounter (Signed)
Attempted to contact pt. I did not receive an answer. I have left a message for pt to return our call.  

## 2018-09-12 NOTE — Telephone Encounter (Signed)
Called and spoke with pt to see if she was able to pick up the prednisone Rx that was called in for her on 11/8 and pt stated she was able to get it.  Stated to pt if she was no better after that Rx to call the office and schedule an OV. Pt expressed understanding. Nothing further needed.

## 2018-09-20 ENCOUNTER — Telehealth: Payer: Self-pay | Admitting: Emergency Medicine

## 2018-09-20 ENCOUNTER — Ambulatory Visit: Payer: Self-pay | Admitting: *Deleted

## 2018-09-20 NOTE — Telephone Encounter (Signed)
Pt reports mild SOB "Little more so than usual." "Doesn't feel like my chest is expanding fully."  H/O COPD. Pt originally calling to request refill of prednisone and Z-Pack RXed by pulmonologist 09/08/18. States last day of Z-Pack. Reports "Chills, leaves me shaking." Denies diaphoresis, states does not have thermometer. Denies CP, dizziness. Suggested pt call pulmonologist as he had seen her and RXed the requested meds as well as indicated with her history. Pt agrees with disposition. Assured pt I would alert Dr. Jenny Reichmann. Advised pt to call back if no resolution with pulmonary.  Reason for Disposition . [1] Longstanding difficulty breathing (e.g., CHF, COPD, emphysema) AND [2] WORSE than normal    Mild worsening, chills, unsure if febrile  Answer Assessment - Initial Assessment Questions 1. RESPIRATORY STATUS: "Describe your breathing?" (e.g., wheezing, shortness of breath, unable to speak, severe coughing)      "A little more SOB than usual." 2. ONSET: "When did this breathing problem begin?"      today 3. PATTERN "Does the difficult breathing come and go, or has it been constant since it started?"      Sometimes worse than others 4. SEVERITY: "How bad is your breathing?" (e.g., mild, moderate, severe)    - MILD: No SOB at rest, mild SOB with walking, speaks normally in sentences, can lay down, no retractions, pulse < 100.    - MODERATE: SOB at rest, SOB with minimal exertion and prefers to sit, cannot lie down flat, speaks in phrases, mild retractions, audible wheezing, pulse 100-120.    - SEVERE: Very SOB at rest, speaks in single words, struggling to breathe, sitting hunched forward, retractions, pulse > 120      Mild 5. RECURRENT SYMPTOM: "Have you had difficulty breathing before?" If so, ask: "When was the last time?" and "What happened that time?"      H/O COPD 6. CARDIAC HISTORY: "Do you have any history of heart disease?" (e.g., heart attack, angina, bypass surgery, angioplasty)   CHF,SOB,MI 7. LUNG HISTORY: "Do you have any history of lung disease?"  (e.g., pulmonary embolus, asthma, emphysema)     COPD 8. CAUSE: "What do you think is causing the breathing problem?"      Maybe a virus with the chills. 9. OTHER SYMPTOMS: "Do you have any other symptoms? (e.g., dizziness, runny nose, cough, chest pain, fever)     "Feels like my lungs aren't quite expanding like they should." No CP, chills, no diaphoresis  Protocols used: BREATHING DIFFICULTY-A-AH

## 2018-09-20 NOTE — Telephone Encounter (Signed)
Pt needs to be scheduled for an OV due to [place] not seeing her since 05/03/18 so that way we can further address her symptoms.  Attempted to call pt but unable to reach her. Left message for pt to return call.

## 2018-09-20 NOTE — Telephone Encounter (Signed)
Patient scheduled with EW for 09/21/18.  No call back is required.

## 2018-09-21 ENCOUNTER — Ambulatory Visit: Payer: Medicare Other | Admitting: Primary Care

## 2018-09-22 ENCOUNTER — Ambulatory Visit (INDEPENDENT_AMBULATORY_CARE_PROVIDER_SITE_OTHER): Payer: Medicare Other | Admitting: Primary Care

## 2018-09-22 ENCOUNTER — Encounter: Payer: Self-pay | Admitting: Primary Care

## 2018-09-22 VITALS — BP 100/64 | HR 77 | Temp 98.3°F | Ht 62.5 in | Wt 92.4 lb

## 2018-09-22 DIAGNOSIS — J9611 Chronic respiratory failure with hypoxia: Secondary | ICD-10-CM

## 2018-09-22 DIAGNOSIS — R350 Frequency of micturition: Secondary | ICD-10-CM | POA: Insufficient documentation

## 2018-09-22 DIAGNOSIS — R6883 Chills (without fever): Secondary | ICD-10-CM | POA: Insufficient documentation

## 2018-09-22 DIAGNOSIS — J439 Emphysema, unspecified: Secondary | ICD-10-CM | POA: Diagnosis not present

## 2018-09-22 LAB — CBC WITH DIFFERENTIAL/PLATELET
BASOS PCT: 0.4 % (ref 0.0–3.0)
Basophils Absolute: 0 10*3/uL (ref 0.0–0.1)
EOS PCT: 0.7 % (ref 0.0–5.0)
Eosinophils Absolute: 0 10*3/uL (ref 0.0–0.7)
HEMATOCRIT: 35.4 % — AB (ref 36.0–46.0)
HEMOGLOBIN: 11.8 g/dL — AB (ref 12.0–15.0)
LYMPHS PCT: 23.5 % (ref 12.0–46.0)
Lymphs Abs: 1.3 10*3/uL (ref 0.7–4.0)
MCHC: 33.2 g/dL (ref 30.0–36.0)
MCV: 92.4 fl (ref 78.0–100.0)
MONO ABS: 0.6 10*3/uL (ref 0.1–1.0)
MONOS PCT: 10.5 % (ref 3.0–12.0)
Neutro Abs: 3.5 10*3/uL (ref 1.4–7.7)
Neutrophils Relative %: 64.9 % (ref 43.0–77.0)
Platelets: 398 10*3/uL (ref 150.0–400.0)
RBC: 3.83 Mil/uL — AB (ref 3.87–5.11)
RDW: 14.1 % (ref 11.5–15.5)
WBC: 5.4 10*3/uL (ref 4.0–10.5)

## 2018-09-22 LAB — BASIC METABOLIC PANEL
BUN: 14 mg/dL (ref 6–23)
CALCIUM: 8.9 mg/dL (ref 8.4–10.5)
CO2: 41 meq/L — AB (ref 19–32)
Chloride: 97 mEq/L (ref 96–112)
Creatinine, Ser: 0.77 mg/dL (ref 0.40–1.20)
GFR: 79.8 mL/min (ref >60.00)
GLUCOSE: 115 mg/dL — AB (ref 70–99)
Potassium: 4.5 mEq/L (ref 3.5–5.1)
SODIUM: 139 meq/L (ref 135–145)

## 2018-09-22 MED ORDER — MONTELUKAST SODIUM 10 MG PO TABS: 10.0000 mg | ORAL_TABLET | Freq: Every day | ORAL | 11 refills | Status: DC

## 2018-09-22 NOTE — Assessment & Plan Note (Signed)
Complains of chills and urinary frequency Check urine culture

## 2018-09-22 NOTE — Assessment & Plan Note (Signed)
Check labs, urine and CXR

## 2018-09-22 NOTE — Progress Notes (Signed)
@Patient  ID: Megan [name], female    DOB: [birth year], 67 y.o.   MRN: [id number]  Chief Complaint  Patient presents with  . Acute Visit    cough, lungs feel inflated, chills, burning in lungs    Referring provider: [name], MD  HPI: 67 year female, former smoker quit 2001. PMH COPD, chronic respiratory failure with hypoxia, allergic rhinitis, esophageal reflex. Patient of Dr. Lamonte Sakai, last seen in July for COPD exacerbation.   09/22/2018 Patient presents today with acute complaints of chills for weeks. States that she feels like her lungs are hyperinflated, some burning pleuritic pain. He has tried tylenol and ibuprofen with no improvement.Taking hydrocodon 10mg  as needed for pleuritic pain, prescribed by PCP. Associated mouth dryness, nasal congestion and cough. Cough is dry, at times congested. She has taken mucinex and continue claritin. Continues Trelegy. Wearing 2.5L - 3.5L oxygen at home. Plan to ambulate patient on oxygen today to determine true oxygen need. No wheezing,vomiting or diarrhea.    Allergies  Allergen Reactions  . Penicillins Rash and Hives    Has patient had a PCN reaction causing immediate rash, facial/tongue/throat swelling, SOB or lightheadedness with hypotension: No Has patient had a PCN reaction causing severe rash involving mucus membranes or skin necrosis:NO Has patient had a PCN reaction that required hospitalization No Has patient had a PCN reaction occurring within the last 10 years:NO If all of the above answers are "NO", then may proceed with Cephalosporin use.    Immunization History  Administered Date(s) Administered  . HiB (PRP-OMP) 09/10/2015  . Influenza, High Dose Seasonal PF 08/27/2016, 07/25/2018  . Influenza,inj,Quad PF,6+ Mos 09/16/2017  . Pneumococcal Conjugate-13 09/16/2017  . Tdap 09/16/2017    Past Medical History:  Diagnosis Date  . Anemia   . Anxiety state 08/20/2015  . CAP (community acquired pneumonia)   . CHF  (congestive heart failure) ([place])   . COPD (chronic obstructive pulmonary disease) ([place])   . Depression   . Essential hypertension 08/19/2015  . GERD (gastroesophageal reflux disease)   . Headache   . History of hiatal hernia   . Myocardial infarction ([place])   . Shortness of breath dyspnea     Tobacco History: Social History   Tobacco Use  Smoking Status Former Smoker  . Packs/day: 1.00  . Years: 30.00  . Pack years: 30.00  . Types: Cigarettes  . Last attempt to quit: 08/18/2000  . Years since quitting: 18.1  Smokeless Tobacco Never Used   Counseling given: Not Answered   Outpatient Medications Prior to Visit  Medication Sig Dispense Refill  . ALPRAZolam (XANAX) 0.25 MG tablet TAKE 1 TABLET(0.25 MG) BY MOUTH TWICE DAILY AS NEEDED FOR ANXIETY 60 tablet 2  . aspirin EC 81 MG tablet Take 81 mg by mouth daily.    . butalbital-acetaminophen-caffeine (FIORICET, ESGIC) 50-325-40 MG tablet Take 1 tablet by mouth 2 (two) times daily as needed for headache. 30 tablet 0  . diltiazem (CARDIZEM CD) 240 MG 24 hr capsule Take 1 capsule (240 mg total) by mouth daily. 30 capsule 3  . feeding supplement, ENSURE ENLIVE, (ENSURE ENLIVE) LIQD Take 237 mLs by mouth 4 (four) times daily. 237 mL 0  . fluticasone (FLONASE) 50 MCG/ACT nasal spray Place 2 sprays into both nostrils daily. 16 g 6  . Fluticasone-Umeclidin-Vilant (TRELEGY ELLIPTA) 100-62.5-25 MCG/INH AEPB Inhale 1 puff into the lungs daily. 60 each 5  . HYDROcodone-acetaminophen (NORCO) 7.5-325 MG tablet Take 1 tablet by mouth every 6 (six) hours as  needed for moderate pain. 60 tablet 0  . levalbuterol (XOPENEX) 1.25 MG/0.5ML nebulizer solution Take 1.25 mg by nebulization every 6 (six) hours. 30 each 12  . lidocaine (LIDODERM) 5 % Place 1 patch onto the skin daily. Remove & Discard patch within 12 hours or as directed by MD 10 patch 0  . loratadine (CLARITIN) 10 MG tablet Take 1 tablet (10 mg total) daily by mouth. 90 tablet 3  . metoprolol  tartrate (LOPRESSOR) 25 MG tablet Take 0.5 tablets (12.5 mg total) by mouth 2 (two) times daily. 30 tablet 1  . pantoprazole (PROTONIX) 40 MG tablet Take 1 tablet (40 mg total) by mouth daily. 30 tablet 1  . polyethylene glycol (MIRALAX / GLYCOLAX) packet Take 17 g by mouth 2 (two) times daily. 14 each 0  . predniSONE (DELTASONE) 10 MG tablet Take 4 tablets for 3 days, 3 tablets for 3 days, 2 tablets for 3 days, 1 tablet for 3 days 30 tablet 0  . predniSONE (DELTASONE) 10 MG tablet 40 mg X 3 days, 30mg  X 3 days, 20mg  X 3 days, 10mg  X 3 days, then stop 30 tablet 0  . predniSONE (DELTASONE) 10 MG tablet Take 4 tabs for 2 days, then 3 tabs for 2 days, 2 tabs for 2 days, then 1 tab for 2 days, then stop. 20 tablet 0  . senna-docusate (SENOKOT-S) 8.6-50 MG tablet Take 1 tablet by mouth 2 (two) times daily. 120 tablet 1  . sertraline (ZOLOFT) 50 MG tablet Take 1 tablet (50 mg total) by mouth daily. 30 tablet 2  . tiZANidine (ZANAFLEX) 4 MG tablet TAKE 1 TABLET(4 MG) BY MOUTH EVERY 6 HOURS AS NEEDED FOR MUSCLE SPASMS 360 tablet 1  . VENTOLIN HFA 108 (90 Base) MCG/ACT inhaler INHALE 2 PUFFS INTO THE LUNGS EVERY 6 HOURS AS NEEDED FOR WHEEZING OR SHORTNESS OF BREATH 1 Inhaler 2  . azithromycin (ZITHROMAX) 250 MG tablet Take as directed 6 tablet 0  . predniSONE (DELTASONE) 20 MG tablet Take 1 tablet (20 mg total) by mouth daily with breakfast. 5 tablet 0  . budesonide (PULMICORT) 0.5 MG/2ML nebulizer solution Take 2 mLs (0.5 mg total) by nebulization 2 (two) times daily. 120 mL 0   No facility-administered medications prior to visit.     Review of Systems  Review of Systems  Constitutional: Positive for chills.  HENT: Positive for sneezing.   Respiratory: Positive for cough, chest tightness and shortness of breath.   Cardiovascular: Negative.   Gastrointestinal: Positive for nausea. Negative for diarrhea and vomiting.  Genitourinary: Positive for frequency.  Psychiatric/Behavioral: The patient is  nervous/anxious.     Physical Exam  BP 100/64 (BP Location: Right Arm, Cuff Size: Normal)   Pulse 77   Temp 98.3 F (36.8 C)   Ht 5' 2.5" (1.588 m)   Wt 92 lb 6.4 oz (41.9 kg)   SpO2 95%   BMI 16.63 kg/m  Physical Exam  Constitutional: She is oriented to person, place, and time. She appears well-developed and well-nourished.  Thin   HENT:  Head: Normocephalic and atraumatic.  Eyes: Pupils are equal, round, and reactive to light. EOM are normal.  Neck: Normal range of motion. Neck supple.  Cardiovascular: Normal rate and regular rhythm.  Pulmonary/Chest: Effort normal. No stridor. No respiratory distress. She has no wheezes. She exhibits no tenderness.  Dim, scattered fine rales   Musculoskeletal: Normal range of motion.  Neurological: She is alert and oriented to person, place, and time.  Skin: Skin  is warm and dry.  Psychiatric: Judgment and thought content normal.     Lab Results:  CBC    Component Value Date/Time   WBC 5.9 05/03/2018 1201   RBC 3.94 05/03/2018 1201   HGB 11.8 (L) 05/03/2018 1201   HCT 35.9 (L) 05/03/2018 1201   PLT 413.0 (H) 05/03/2018 1201   MCV 91.1 05/03/2018 1201   MCH 29.6 04/21/2018 0527   MCHC 33.0 05/03/2018 1201   RDW 15.4 05/03/2018 1201   LYMPHSABS 0.8 05/03/2018 1201   MONOABS 0.7 05/03/2018 1201   EOSABS 0.0 05/03/2018 1201   BASOSABS 0.1 05/03/2018 1201    BMET    Component Value Date/Time   NA 135 05/03/2018 1201   K 4.2 05/03/2018 1201   CL 90 (L) 05/03/2018 1201   CO2 35 (H) 05/03/2018 1201   GLUCOSE 71 05/03/2018 1201   BUN 15 05/03/2018 1201   CREATININE 0.66 05/03/2018 1201   CALCIUM 8.7 05/03/2018 1201   GFRNONAA >60 04/21/2018 0527   GFRAA >60 04/21/2018 0527    BNP    Component Value Date/Time   BNP 184.2 (H) 02/12/2018 2052    ProBNP No results found for: PROBNP  Imaging: No results found.   Assessment & Plan:   COPD (chronic obstructive pulmonary disease) ([place]) No evidence for COPD  exacerbation  Continue Trelegy 1 puff daily  Add Singulair   Chronic respiratory failure with hypoxia ([facility]) Complains of hyperinflated lungs Wearing 2.5-3.5L oxygen at home at rest  Ambulatory oxygen levels today in office maintained >90% on 1-1.5L  Checking CXR   Urinary frequency Complains of chills and urinary frequency Check urine culture    Martyn Ehrich, NP 09/22/2018

## 2018-09-22 NOTE — Assessment & Plan Note (Addendum)
No evidence for COPD exacerbation  Continue Trelegy 1 puff daily  Add Singulair

## 2018-09-22 NOTE — Assessment & Plan Note (Addendum)
Complains of hyperinflated lungs Wearing 2.5-3.5L oxygen at home at rest  Ambulatory oxygen levels today in office maintained >90% on 1-1.5L  Checking CXR

## 2018-09-22 NOTE — Patient Instructions (Addendum)
Recommendations: - Biotene mouth spry/wash - for dry mouth - Make sure to wear warm clothing- Such as turtle necks, sweaters, warm layers, socks and shoes. Drink warm tea, warm beverages  RX:  -Add singulair daily at bedtime  Orders: -Check labs, urine culture -CXR Monday  -Walk to see oxygen requirements   Oxygen requirements:     Follow up with PCP regarding chills

## 2018-09-23 LAB — URINE CULTURE
MICRO NUMBER: 91411501
SPECIMEN QUALITY:: ADEQUATE

## 2018-10-05 ENCOUNTER — Telehealth: Payer: Self-pay | Admitting: Emergency Medicine

## 2018-10-05 NOTE — Telephone Encounter (Signed)
Called patient unable to reach left message to give us a call back.

## 2018-10-06 NOTE — Telephone Encounter (Signed)
O2 less than 88  HR less than 55 or greater than 100

## 2018-10-06 NOTE — Telephone Encounter (Signed)
Call made to patient, made aware of EW recommendations, voiced understanding. I had patient repeat back to me the ranges to be sure she understood. Nothing further is needed at this time.

## 2018-10-06 NOTE — Telephone Encounter (Signed)
Called and spoke with pt. Pt has a pulse oximeter that she is trying to figure out what she needs to put for values of her O2 sats and HR.  Pt states there is a place where she needs to place the min for the lowest number that her HR needs to not go past before an alarm will sound and also she needs to have the max for the highest number that her HR needs to not go past before an alarm will sound.  Pt also needs to know the same range for her O2 sats.  Beth, please clarify all of this information for pt what is adequate range for her HR and O2 sats so she can get the information placed on the pulse oximeter. Thanks!

## 2018-10-26 ENCOUNTER — Other Ambulatory Visit: Payer: Self-pay | Admitting: Emergency Medicine

## 2018-10-31 ENCOUNTER — Other Ambulatory Visit: Payer: Self-pay | Admitting: Emergency Medicine

## 2018-11-04 ENCOUNTER — Inpatient Hospital Stay (HOSPITAL_COMMUNITY)
Admission: EM | Admit: 2018-11-04 | Discharge: 2018-11-10 | DRG: 871 | Disposition: A | Discharge status: Home / Self Care | Payer: Medicare Other | Attending: Internal Medicine | Admitting: Internal Medicine

## 2018-11-04 ENCOUNTER — Other Ambulatory Visit: Payer: Self-pay

## 2018-11-04 ENCOUNTER — Encounter (HOSPITAL_COMMUNITY): Payer: Self-pay | Admitting: Emergency Medicine

## 2018-11-04 ENCOUNTER — Emergency Department (HOSPITAL_COMMUNITY): Payer: Medicare Other

## 2018-11-04 DIAGNOSIS — Z7951 Long term (current) use of inhaled steroids: Secondary | ICD-10-CM | POA: Diagnosis not present

## 2018-11-04 DIAGNOSIS — I252 Old myocardial infarction: Secondary | ICD-10-CM | POA: Diagnosis not present

## 2018-11-04 DIAGNOSIS — Z7982 Long term (current) use of aspirin: Secondary | ICD-10-CM

## 2018-11-04 DIAGNOSIS — R739 Hyperglycemia, unspecified: Secondary | ICD-10-CM | POA: Diagnosis present

## 2018-11-04 DIAGNOSIS — R0603 Acute respiratory distress: Secondary | ICD-10-CM

## 2018-11-04 DIAGNOSIS — A4189 Other specified sepsis: Principal | ICD-10-CM | POA: Diagnosis present

## 2018-11-04 DIAGNOSIS — R918 Other nonspecific abnormal finding of lung field: Secondary | ICD-10-CM | POA: Diagnosis present

## 2018-11-04 DIAGNOSIS — J9621 Acute and chronic respiratory failure with hypoxia: Secondary | ICD-10-CM | POA: Diagnosis present

## 2018-11-04 DIAGNOSIS — J9622 Acute and chronic respiratory failure with hypercapnia: Secondary | ICD-10-CM | POA: Diagnosis present

## 2018-11-04 DIAGNOSIS — K219 Gastro-esophageal reflux disease without esophagitis: Secondary | ICD-10-CM | POA: Diagnosis present

## 2018-11-04 DIAGNOSIS — Z681 Body mass index (BMI) 19 or less, adult: Secondary | ICD-10-CM | POA: Diagnosis not present

## 2018-11-04 DIAGNOSIS — F419 Anxiety disorder, unspecified: Secondary | ICD-10-CM | POA: Diagnosis present

## 2018-11-04 DIAGNOSIS — Z8249 Family history of ischemic heart disease and other diseases of the circulatory system: Secondary | ICD-10-CM

## 2018-11-04 DIAGNOSIS — R0602 Shortness of breath: Secondary | ICD-10-CM

## 2018-11-04 DIAGNOSIS — Z515 Encounter for palliative care: Secondary | ICD-10-CM | POA: Diagnosis present

## 2018-11-04 DIAGNOSIS — T380X5A Adverse effect of glucocorticoids and synthetic analogues, initial encounter: Secondary | ICD-10-CM | POA: Diagnosis present

## 2018-11-04 DIAGNOSIS — Z818 Family history of other mental and behavioral disorders: Secondary | ICD-10-CM | POA: Diagnosis not present

## 2018-11-04 DIAGNOSIS — R0902 Hypoxemia: Secondary | ICD-10-CM | POA: Diagnosis not present

## 2018-11-04 DIAGNOSIS — R6520 Severe sepsis without septic shock: Secondary | ICD-10-CM | POA: Diagnosis not present

## 2018-11-04 DIAGNOSIS — I110 Hypertensive heart disease with heart failure: Secondary | ICD-10-CM | POA: Diagnosis present

## 2018-11-04 DIAGNOSIS — A419 Sepsis, unspecified organism: Secondary | ICD-10-CM

## 2018-11-04 DIAGNOSIS — J9601 Acute respiratory failure with hypoxia: Secondary | ICD-10-CM | POA: Diagnosis not present

## 2018-11-04 DIAGNOSIS — R1310 Dysphagia, unspecified: Secondary | ICD-10-CM

## 2018-11-04 DIAGNOSIS — I2510 Atherosclerotic heart disease of native coronary artery without angina pectoris: Secondary | ICD-10-CM | POA: Diagnosis present

## 2018-11-04 DIAGNOSIS — R0689 Other abnormalities of breathing: Secondary | ICD-10-CM | POA: Diagnosis not present

## 2018-11-04 DIAGNOSIS — J441 Chronic obstructive pulmonary disease with (acute) exacerbation: Secondary | ICD-10-CM | POA: Diagnosis present

## 2018-11-04 DIAGNOSIS — Z79899 Other long term (current) drug therapy: Secondary | ICD-10-CM

## 2018-11-04 DIAGNOSIS — R05 Cough: Secondary | ICD-10-CM

## 2018-11-04 DIAGNOSIS — G4489 Other headache syndrome: Secondary | ICD-10-CM | POA: Diagnosis not present

## 2018-11-04 DIAGNOSIS — J101 Influenza due to other identified influenza virus with other respiratory manifestations: Secondary | ICD-10-CM | POA: Diagnosis present

## 2018-11-04 DIAGNOSIS — F411 Generalized anxiety disorder: Secondary | ICD-10-CM | POA: Diagnosis present

## 2018-11-04 DIAGNOSIS — J80 Acute respiratory distress syndrome: Secondary | ICD-10-CM | POA: Diagnosis not present

## 2018-11-04 DIAGNOSIS — F329 Major depressive disorder, single episode, unspecified: Secondary | ICD-10-CM | POA: Diagnosis present

## 2018-11-04 DIAGNOSIS — Z87891 Personal history of nicotine dependence: Secondary | ICD-10-CM

## 2018-11-04 DIAGNOSIS — Z7952 Long term (current) use of systemic steroids: Secondary | ICD-10-CM

## 2018-11-04 DIAGNOSIS — J9602 Acute respiratory failure with hypercapnia: Secondary | ICD-10-CM | POA: Diagnosis not present

## 2018-11-04 DIAGNOSIS — I5032 Chronic diastolic (congestive) heart failure: Secondary | ICD-10-CM | POA: Diagnosis present

## 2018-11-04 DIAGNOSIS — D649 Anemia, unspecified: Secondary | ICD-10-CM | POA: Diagnosis present

## 2018-11-04 DIAGNOSIS — J9611 Chronic respiratory failure with hypoxia: Secondary | ICD-10-CM | POA: Diagnosis present

## 2018-11-04 DIAGNOSIS — E43 Unspecified severe protein-calorie malnutrition: Secondary | ICD-10-CM | POA: Diagnosis present

## 2018-11-04 DIAGNOSIS — J9690 Respiratory failure, unspecified, unspecified whether with hypoxia or hypercapnia: Secondary | ICD-10-CM | POA: Diagnosis present

## 2018-11-04 DIAGNOSIS — I10 Essential (primary) hypertension: Secondary | ICD-10-CM | POA: Diagnosis present

## 2018-11-04 DIAGNOSIS — J449 Chronic obstructive pulmonary disease, unspecified: Secondary | ICD-10-CM | POA: Diagnosis present

## 2018-11-04 DIAGNOSIS — R059 Cough, unspecified: Secondary | ICD-10-CM

## 2018-11-04 DIAGNOSIS — J9612 Chronic respiratory failure with hypercapnia: Secondary | ICD-10-CM

## 2018-11-04 DIAGNOSIS — Z880 Allergy status to penicillin: Secondary | ICD-10-CM

## 2018-11-04 LAB — URINALYSIS, ROUTINE W REFLEX MICROSCOPIC
Bacteria, UA: NONE SEEN
Bilirubin Urine: NEGATIVE
Glucose, UA: 50 mg/dL — AB
Ketones, ur: NEGATIVE mg/dL
Leukocytes, UA: NEGATIVE
Nitrite: NEGATIVE
Protein, ur: 100 mg/dL — AB
Specific Gravity, Urine: 1.014 (ref 1.005–1.030)
pH: 6 (ref 5.0–8.0)

## 2018-11-04 LAB — CBC WITH DIFFERENTIAL/PLATELET
Abs Immature Granulocytes: 0.05 10*3/uL (ref 0.00–0.07)
Basophils Absolute: 0.1 10*3/uL (ref 0.0–0.1)
Basophils Relative: 0 %
EOS ABS: 0 10*3/uL (ref 0.0–0.5)
Eosinophils Relative: 0 %
HCT: 40.9 % (ref 36.0–46.0)
Hemoglobin: 12.2 g/dL (ref 12.0–15.0)
Immature Granulocytes: 0 %
Lymphocytes Relative: 28 %
Lymphs Abs: 3.9 10*3/uL (ref 0.7–4.0)
MCH: 28.9 pg (ref 26.0–34.0)
MCHC: 29.8 g/dL — ABNORMAL LOW (ref 30.0–36.0)
MCV: 96.9 fL (ref 80.0–100.0)
MONO ABS: 2 10*3/uL — AB (ref 0.1–1.0)
Monocytes Relative: 14 %
Neutro Abs: 7.7 10*3/uL (ref 1.7–7.7)
Neutrophils Relative %: 58 %
Platelets: 310 10*3/uL (ref 150–400)
RBC: 4.22 MIL/uL (ref 3.87–5.11)
RDW: 12.7 % (ref 11.5–15.5)
WBC: 13.7 10*3/uL — ABNORMAL HIGH (ref 4.0–10.5)
nRBC: 0 % (ref 0.0–0.2)

## 2018-11-04 LAB — INFLUENZA PANEL BY PCR (TYPE A & B)
Influenza A By PCR: POSITIVE — AB
Influenza B By PCR: NEGATIVE

## 2018-11-04 LAB — I-STAT CG4 LACTIC ACID, ED
Lactic Acid, Venous: 2.6 mmol/L (ref 0.5–1.9)
Lactic Acid, Venous: 1.09 mmol/L (ref 0.5–1.9)

## 2018-11-04 LAB — CBG MONITORING, ED: Glucose-Capillary: 171 mg/dL — ABNORMAL HIGH (ref 70–99)

## 2018-11-04 LAB — COMPREHENSIVE METABOLIC PANEL
ALT: 18 U/L (ref 0–44)
AST: 28 U/L (ref 15–41)
Albumin: 3.8 g/dL (ref 3.5–5.0)
Alkaline Phosphatase: 58 U/L (ref 38–126)
Anion gap: 10 (ref 5–15)
BUN: 10 mg/dL (ref 8–23)
CO2: 32 mmol/L (ref 22–32)
Calcium: 8.3 mg/dL — ABNORMAL LOW (ref 8.9–10.3)
Chloride: 94 mmol/L — ABNORMAL LOW (ref 98–111)
Creatinine, Ser: 0.84 mg/dL (ref 0.44–1.00)
GFR calc Af Amer: >60 mL/min (ref >60)
GFR calc non Af Amer: >60 mL/min (ref >60)
Glucose, Bld: 187 mg/dL — ABNORMAL HIGH (ref 70–99)
Potassium: 3.9 mmol/L (ref 3.5–5.1)
Sodium: 136 mmol/L (ref 135–145)
Total Bilirubin: 0.4 mg/dL (ref 0.3–1.2)
Total Protein: 6.5 g/dL (ref 6.5–8.1)

## 2018-11-04 LAB — I-STAT TROPONIN, ED
Troponin i, poc: 0.02 ng/mL (ref 0.00–0.08)
Troponin i, poc: 0.05 ng/mL (ref 0.00–0.08)

## 2018-11-04 LAB — GLUCOSE, CAPILLARY: Glucose-Capillary: 259 mg/dL — ABNORMAL HIGH (ref 70–99)

## 2018-11-04 LAB — LIPASE, BLOOD: Lipase: 26 U/L (ref 11–51)

## 2018-11-04 MED ORDER — METOPROLOL TARTRATE 12.5 MG HALF TABLET
12.5000 mg | ORAL_TABLET | Freq: Two times a day (BID) | ORAL | Status: DC
  Filled 2018-11-04 (×2): qty 1

## 2018-11-04 MED ORDER — BUDESONIDE 0.5 MG/2ML IN SUSP
0.5000 mg | Freq: Two times a day (BID) | RESPIRATORY_TRACT | Status: DC
  Administered 2018-11-05 – 2018-11-10 (×10): 0.5 mg via RESPIRATORY_TRACT
  Filled 2018-11-04 (×11): qty 2

## 2018-11-04 MED ORDER — VANCOMYCIN HCL IN DEXTROSE 1-5 GM/200ML-% IV SOLN
1000.0000 mg | INTRAVENOUS | Status: DC
  Filled 2018-11-04: qty 200

## 2018-11-04 MED ORDER — INSULIN ASPART 100 UNIT/ML ~~LOC~~ SOLN
0.0000 [IU] | Freq: Every day | SUBCUTANEOUS | Status: DC
  Administered 2018-11-04: 3 [IU] via SUBCUTANEOUS
  Administered 2018-11-05: 4 [IU] via SUBCUTANEOUS
  Administered 2018-11-06: 2 [IU] via SUBCUTANEOUS

## 2018-11-04 MED ORDER — LORATADINE 10 MG PO TABS
10.0000 mg | ORAL_TABLET | Freq: Every day | ORAL | Status: DC
  Administered 2018-11-04 – 2018-11-10 (×7): 10 mg via ORAL
  Filled 2018-11-04 (×7): qty 1

## 2018-11-04 MED ORDER — HYDROCODONE-ACETAMINOPHEN 5-325 MG PO TABS
1.0000 | ORAL_TABLET | Freq: Four times a day (QID) | ORAL | Status: DC | PRN
  Administered 2018-11-04 – 2018-11-09 (×10): 1 via ORAL
  Filled 2018-11-04 (×10): qty 1

## 2018-11-04 MED ORDER — SODIUM CHLORIDE 0.9 % IV SOLN
2.0000 g | Freq: Once | INTRAVENOUS | Status: AC
  Administered 2018-11-04: 2 g via INTRAVENOUS
  Filled 2018-11-04: qty 2

## 2018-11-04 MED ORDER — METHYLPREDNISOLONE SODIUM SUCC 125 MG IJ SOLR: 125.0000 mg | Freq: Once | INTRAMUSCULAR | Status: DC

## 2018-11-04 MED ORDER — SODIUM CHLORIDE 0.9 % IV SOLN
INTRAVENOUS | Status: DC
  Administered 2018-11-04 (×2): via INTRAVENOUS

## 2018-11-04 MED ORDER — IPRATROPIUM-ALBUTEROL 0.5-2.5 (3) MG/3ML IN SOLN
3.0000 mL | Freq: Once | RESPIRATORY_TRACT | Status: AC
  Administered 2018-11-04: 3 mL via RESPIRATORY_TRACT
  Filled 2018-11-04: qty 3

## 2018-11-04 MED ORDER — METRONIDAZOLE IN NACL 5-0.79 MG/ML-% IV SOLN
500.0000 mg | Freq: Three times a day (TID) | INTRAVENOUS | Status: DC
  Administered 2018-11-04 – 2018-11-05 (×3): 500 mg via INTRAVENOUS
  Filled 2018-11-04 (×3): qty 100

## 2018-11-04 MED ORDER — VANCOMYCIN HCL IN DEXTROSE 1-5 GM/200ML-% IV SOLN
1000.0000 mg | Freq: Once | INTRAVENOUS | Status: AC
  Administered 2018-11-04: 1000 mg via INTRAVENOUS
  Filled 2018-11-04: qty 200

## 2018-11-04 MED ORDER — INSULIN ASPART 100 UNIT/ML ~~LOC~~ SOLN
0.0000 [IU] | Freq: Three times a day (TID) | SUBCUTANEOUS | Status: DC
  Administered 2018-11-04: 2 [IU] via SUBCUTANEOUS
  Administered 2018-11-05: 3 [IU] via SUBCUTANEOUS
  Administered 2018-11-05 – 2018-11-06 (×2): 2 [IU] via SUBCUTANEOUS
  Administered 2018-11-06: 5 [IU] via SUBCUTANEOUS
  Administered 2018-11-07: 2 [IU] via SUBCUTANEOUS
  Administered 2018-11-08 (×2): 1 [IU] via SUBCUTANEOUS
  Administered 2018-11-09: 7 [IU] via SUBCUTANEOUS
  Administered 2018-11-09: 1 [IU] via SUBCUTANEOUS
  Administered 2018-11-10: 3 [IU] via SUBCUTANEOUS

## 2018-11-04 MED ORDER — SODIUM CHLORIDE 0.9 % IV BOLUS (SEPSIS)
1000.0000 mL | Freq: Once | INTRAVENOUS | Status: AC
  Administered 2018-11-04: 1000 mL via INTRAVENOUS

## 2018-11-04 MED ORDER — MONTELUKAST SODIUM 10 MG PO TABS
10.0000 mg | ORAL_TABLET | Freq: Every day | ORAL | Status: DC
  Administered 2018-11-04 – 2018-11-09 (×6): 10 mg via ORAL
  Filled 2018-11-04 (×7): qty 1

## 2018-11-04 MED ORDER — FLUTICASONE PROPIONATE 50 MCG/ACT NA SUSP
2.0000 | Freq: Every day | NASAL | Status: DC
  Administered 2018-11-05 – 2018-11-10 (×6): 2 via NASAL
  Filled 2018-11-04 (×2): qty 16

## 2018-11-04 MED ORDER — ALPRAZOLAM 0.25 MG PO TABS
0.2500 mg | ORAL_TABLET | Freq: Two times a day (BID) | ORAL | Status: DC
  Administered 2018-11-04 – 2018-11-10 (×13): 0.25 mg via ORAL
  Filled 2018-11-04 (×13): qty 1

## 2018-11-04 MED ORDER — OSELTAMIVIR PHOSPHATE 75 MG PO CAPS
75.0000 mg | ORAL_CAPSULE | Freq: Two times a day (BID) | ORAL | Status: DC
  Administered 2018-11-04 – 2018-11-05 (×3): 75 mg via ORAL
  Filled 2018-11-04 (×3): qty 1

## 2018-11-04 MED ORDER — SODIUM CHLORIDE 0.9 % IV SOLN
2.0000 g | INTRAVENOUS | Status: DC
  Administered 2018-11-05: 2 g via INTRAVENOUS
  Filled 2018-11-04: qty 2

## 2018-11-04 MED ORDER — PANTOPRAZOLE SODIUM 40 MG PO TBEC
40.0000 mg | DELAYED_RELEASE_TABLET | Freq: Every day | ORAL | Status: DC
  Administered 2018-11-04 – 2018-11-10 (×7): 40 mg via ORAL
  Filled 2018-11-04 (×7): qty 1

## 2018-11-04 MED ORDER — MAGNESIUM SULFATE 2 GM/50ML IV SOLN
2.0000 g | Freq: Once | INTRAVENOUS | Status: AC
  Administered 2018-11-04: 2 g via INTRAVENOUS
  Filled 2018-11-04: qty 50

## 2018-11-04 MED ORDER — IPRATROPIUM-ALBUTEROL 0.5-2.5 (3) MG/3ML IN SOLN
3.0000 mL | Freq: Four times a day (QID) | RESPIRATORY_TRACT | Status: DC
  Administered 2018-11-04 – 2018-11-05 (×4): 3 mL via RESPIRATORY_TRACT
  Filled 2018-11-04 (×4): qty 3

## 2018-11-04 MED ORDER — SERTRALINE HCL 50 MG PO TABS
50.0000 mg | ORAL_TABLET | Freq: Every day | ORAL | Status: DC
  Administered 2018-11-05 – 2018-11-10 (×6): 50 mg via ORAL
  Filled 2018-11-04 (×6): qty 1

## 2018-11-04 MED ORDER — METHYLPREDNISOLONE SODIUM SUCC 125 MG IJ SOLR
60.0000 mg | Freq: Four times a day (QID) | INTRAMUSCULAR | Status: DC
  Administered 2018-11-04 – 2018-11-06 (×7): 60 mg via INTRAVENOUS
  Filled 2018-11-04 (×7): qty 2

## 2018-11-04 MED ORDER — MORPHINE SULFATE (PF) 2 MG/ML IV SOLN
2.0000 mg | INTRAVENOUS | Status: DC | PRN
  Administered 2018-11-04 – 2018-11-05 (×2): 2 mg via INTRAVENOUS
  Filled 2018-11-04 (×2): qty 1

## 2018-11-04 MED ORDER — ASPIRIN EC 81 MG PO TBEC
81.0000 mg | DELAYED_RELEASE_TABLET | Freq: Every day | ORAL | Status: DC
  Administered 2018-11-04 – 2018-11-10 (×7): 81 mg via ORAL
  Filled 2018-11-04 (×7): qty 1

## 2018-11-04 MED ORDER — SODIUM CHLORIDE 0.9 % IV BOLUS (SEPSIS)
500.0000 mL | Freq: Once | INTRAVENOUS | Status: AC
  Administered 2018-11-04: 500 mL via INTRAVENOUS

## 2018-11-04 MED ORDER — DILTIAZEM HCL ER COATED BEADS 240 MG PO CP24
240.0000 mg | ORAL_CAPSULE | Freq: Every day | ORAL | Status: DC
  Administered 2018-11-04: 240 mg via ORAL
  Filled 2018-11-04 (×2): qty 1
  Filled 2018-11-04: qty 2

## 2018-11-04 MED ORDER — ENOXAPARIN SODIUM 40 MG/0.4ML ~~LOC~~ SOLN
40.0000 mg | SUBCUTANEOUS | Status: DC
  Administered 2018-11-04 – 2018-11-09 (×6): 40 mg via SUBCUTANEOUS
  Filled 2018-11-04 (×7): qty 0.4

## 2018-11-04 MED ORDER — SODIUM CHLORIDE 0.9 % IV SOLN: 2.0000 g | Freq: Once | INTRAVENOUS | Status: DC

## 2018-11-04 NOTE — ED Provider Notes (Signed)
[facility] EMERGENCY DEPARTMENT Provider Note   CSN: [id number] Arrival date & time: 11/04/18  1025     History   Chief Complaint Chief Complaint  Patient presents with  . Respiratory Distress    HPI Megan [name] is a 68 y.o. female.  The history is provided by the patient, the EMS personnel and medical records. No language interpreter was used.  Shortness of Breath  This is a recurrent problem. The problem occurs continuously.The current episode started 2 days ago. The problem has been rapidly worsening. Associated symptoms include a fever, cough, sputum production and wheezing. Pertinent negatives include no headaches, no rhinorrhea, no neck pain, no hemoptysis, no chest pain, no syncope, no vomiting, no abdominal pain, no rash, no leg pain and no leg swelling. She has tried beta-agonist inhalers for the symptoms. The treatment provided mild relief. Associated medical issues include COPD, chronic lung disease, CAD, heart failure and past MI.    Past Medical History:  Diagnosis Date  . Anemia   . Anxiety state 08/20/2015  . CAP (community acquired pneumonia)   . CHF (congestive heart failure) ([place])   . COPD (chronic obstructive pulmonary disease) ([place])   . Depression   . Essential hypertension 08/19/2015  . GERD (gastroesophageal reflux disease)   . Headache   . History of hiatal hernia   . Myocardial infarction ([place])   . Shortness of breath dyspnea     Patient Active Problem List   Diagnosis Date Noted  . Urinary frequency 09/22/2018  . Chills without fever 09/22/2018  . Thoracic back pain 07/25/2018  . Low back pain 07/25/2018  . Coccyx pain 07/25/2018  . Fever 05/03/2018  . Food impaction of esophagus   . Abnormal esophagram   . Abdominal pain 04/16/2018  . Epigastric pain 04/16/2018  . Chest pain 04/16/2018  . Chronic respiratory failure with hypoxia ([place]) 04/16/2018  . Dysphagia 04/16/2018  . Odynophagia 04/16/2018  . Allergic rhinitis  03/31/2018  . Malnutrition of moderate degree 02/27/2018  . 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 Palliative care by specialist   . Acute on Chronic respiratory failure with hypoxia and SEVERE Hypercapnia ([place]) 02/13/2018  . Acute pain of right shoulder 01/07/2018  . Flu-like symptoms 12/09/2017  . Fibromyalgia 09/16/2017  . Fatigue 09/16/2017  . Chronic respiratory failure with hypoxia and hypercapnia (HCC)   . Sepsis ([place])   . Hyperglycemia 01/05/2017  . Hair loss 06/22/2016  . Rash 06/22/2016  . Mixed headache 03/19/2016  . COPD (chronic obstructive pulmonary disease) ([place]) 03/19/2016  . Esophageal reflux 09/01/2015  . Goals of care, counseling/discussion   . Palliative care encounter   . Anxiety 08/20/2015  . Chronic post-traumatic stress disorder (PTSD) 08/20/2015  . Multiple pulmonary nodules determined by computed tomography of lung 08/19/2015  . Essential hypertension 08/19/2015  . Protein-calorie malnutrition, severe 08/19/2015    Past Surgical History:  Procedure Laterality Date  . CARDIAC CATHETERIZATION    . CARDIAC CATHETERIZATION N/A 08/26/2015   Procedure: Left Heart Cath and Coronary Angiography;  Surgeon: Jettie Booze, MD;  Location: [facility];  Service: Cardiovascular;  Laterality: N/A;  . ESOPHAGOGASTRODUODENOSCOPY N/A 04/18/2018   Procedure: ESOPHAGOGASTRODUODENOSCOPY (EGD);  Surgeon: Ladene Artist, MD;  [facility] ENDOSCOPY;  Service: Endoscopy;  Laterality: N/A;  . tracheostomy       OB History   No obstetric history on file.      Home Medications    Prior to  Admission medications   Medication Sig Start Date End Date Taking? Authorizing Provider  ALPRAZolam Duanne Moron) 0.25 MG tablet TAKE 1 TABLET(0.25 MG) BY MOUTH TWICE DAILY AS NEEDED FOR ANXIETY 08/10/18   [name], MD  aspirin EC 81 MG tablet Take 81 mg by mouth daily.    [provider]  budesonide (PULMICORT) 0.5 MG/2ML  nebulizer solution Take 2 mLs (0.5 mg total) by nebulization 2 (two) times daily. 03/08/17 04/07/17  Damita Lack, MD  butalbital-acetaminophen-caffeine (FIORICET, ESGIC) [id number] MG tablet Take 1 tablet by mouth 2 (two) times daily as needed for headache. 02/10/18   [name], MD  diltiazem (CARDIZEM CD) 240 MG 24 hr capsule Take 1 capsule (240 mg total) by mouth daily. 02/27/18   Roxan Hockey, MD  feeding supplement, ENSURE ENLIVE, (ENSURE ENLIVE) LIQD Take 237 mLs by mouth 4 (four) times daily. 01/11/17   Eugenie Filler, MD  fluticasone (FLONASE) 50 MCG/ACT nasal spray Place 2 sprays into both nostrils daily. 01/04/18   Marrian Salvage, FNP  HYDROcodone-acetaminophen (NORCO) 7.5-325 MG tablet Take 1 tablet by mouth every 6 (six) hours as needed for moderate pain. 08/10/18   [name], MD  levalbuterol Penne Lash) 1.25 MG/0.5ML nebulizer solution Take 1.25 mg by nebulization every 6 (six) hours. 02/27/18   Roxan Hockey, MD  lidocaine (LIDODERM) 5 % Place 1 patch onto the skin daily. Remove & Discard patch within 12 hours or as directed by MD 04/21/18   Shelly Coss, MD  loratadine (CLARITIN) 10 MG tablet Take 1 tablet (10 mg total) daily by mouth. 09/16/17   [name], MD  metoprolol tartrate (LOPRESSOR) 25 MG tablet Take 0.5 tablets (12.5 mg total) by mouth 2 (two) times daily. 02/27/18   Roxan Hockey, MD  montelukast (SINGULAIR) 10 MG tablet Take 1 tablet (10 mg total) by mouth at bedtime. 09/22/18   Martyn Ehrich, NP  pantoprazole (PROTONIX) 40 MG tablet Take 1 tablet (40 mg total) by mouth daily. 02/28/18   Roxan Hockey, MD  polyethylene glycol (MIRALAX / GLYCOLAX) packet Take 17 g by mouth 2 (two) times daily. 01/11/17   Eugenie Filler, MD  predniSONE (DELTASONE) 10 MG tablet Take 4 tablets for 3 days, 3 tablets for 3 days, 2 tablets for 3 days, 1 tablet for 3 days 06/08/18   Deneise Lever, MD  predniSONE (DELTASONE) 10 MG tablet 40 mg X 3 days, 30mg  X  3 days, 20mg  X 3 days, 10mg  X 3 days, then stop 08/04/18   Collene Gobble, MD  predniSONE (DELTASONE) 10 MG tablet Take 4 tabs for 2 days, then 3 tabs for 2 days, 2 tabs for 2 days, then 1 tab for 2 days, then stop. 09/08/18   Parrett, Fonnie Mu, NP  senna-docusate (SENOKOT-S) 8.6-50 MG tablet Take 1 tablet by mouth 2 (two) times daily. 02/27/18   Roxan Hockey, MD  sertraline (ZOLOFT) 50 MG tablet Take 1 tablet (50 mg total) by mouth daily. 02/27/18   Roxan Hockey, MD  tiZANidine (ZANAFLEX) 4 MG tablet TAKE 1 TABLET(4 MG) BY MOUTH EVERY 6 HOURS AS NEEDED FOR MUSCLE SPASMS 09/16/17   [name], MD  TRELEGY ELLIPTA 100-62.5-25 MCG/INH AEPB INHALE 1 PUFF INTO THE LUNGS DAILY 10/26/18   Collene Gobble, MD  VENTOLIN HFA 108 (90 Base) MCG/ACT inhaler INHALE 2 PUFFS INTO THE LUNGS EVERY 6 HOURS AS NEEDED FOR WHEEZING OR SHORTNESS OF BREATH 11/02/18   Collene Gobble, MD  Family History Family History  Problem Relation Age of Onset  . Cirrhosis Mother   . Depression Mother   . Heart disease Father     Social History Social History   Tobacco Use  . Smoking status: Former Smoker    Packs/day: 1.00    Years: 30.00    Pack years: 30.00    Types: Cigarettes    Last attempt to quit: 08/18/2000    Years since quitting: 18.2  . Smokeless tobacco: Never Used  Substance Use Topics  . Alcohol use: No  . Drug use: No     Allergies   Penicillins   Review of Systems Review of Systems  Constitutional: Positive for chills, fatigue and fever. Negative for diaphoresis.  HENT: Positive for congestion. Negative for rhinorrhea.   Respiratory: Positive for cough, sputum production, chest tightness, shortness of breath and wheezing. Negative for hemoptysis and stridor.   Cardiovascular: Negative for chest pain, leg swelling and syncope.  Gastrointestinal: Positive for nausea. Negative for abdominal pain, constipation, diarrhea and vomiting.  Genitourinary: Negative for dysuria and flank pain.    Musculoskeletal: Negative for back pain, neck pain and neck stiffness.  Skin: Negative for rash.  Neurological: Negative for light-headedness and headaches.  Psychiatric/Behavioral: Negative for agitation.  All other systems reviewed and are negative.    Physical Exam Updated Vital Signs BP (!) 117/52   Pulse (!) 139   Temp 99.9 F (37.7 C) (Axillary)   Resp (!) 26   SpO2 98%   Physical Exam Vitals signs and nursing note reviewed.  Constitutional:      General: She is not in acute distress.    Appearance: She is well-developed. She is ill-appearing. She is not toxic-appearing or diaphoretic.  HENT:     Head: Normocephalic and atraumatic.     Nose: Rhinorrhea present. No congestion.     Mouth/Throat:     Pharynx: No oropharyngeal exudate or posterior oropharyngeal erythema.  Eyes:     Extraocular Movements: Extraocular movements intact.     Conjunctiva/sclera: Conjunctivae normal.     Pupils: Pupils are equal, round, and reactive to light.  Neck:     Musculoskeletal: Neck supple.  Cardiovascular:     Rate and Rhythm: Regular rhythm. Tachycardia present.     Pulses: Normal pulses.     Heart sounds: No murmur.  Pulmonary:     Effort: Pulmonary effort is normal. No respiratory distress.     Breath sounds: Wheezing and rhonchi present. No rales.  Chest:     Chest wall: No tenderness.  Abdominal:     General: There is no distension.     Palpations: Abdomen is soft.     Tenderness: There is no abdominal tenderness.  Musculoskeletal:        General: No tenderness.     Right lower leg: No edema.     Left lower leg: No edema.  Skin:    General: Skin is warm and dry.     Capillary Refill: Capillary refill takes less than 2 seconds.  Neurological:     General: No focal deficit present.     Mental Status: She is alert.  Psychiatric:        Mood and Affect: Mood is anxious.      ED Treatments / Results  Labs (all labs ordered are listed, but only abnormal results  are displayed) Labs Reviewed  COMPREHENSIVE METABOLIC PANEL - Abnormal; Notable for the following components:      Result Value  Chloride 94 (*)    Glucose, Bld 187 (*)    Calcium 8.3 (*)    All other components within normal limits  CBC WITH DIFFERENTIAL/PLATELET - Abnormal; Notable for the following components:   WBC 13.7 (*)    MCHC 29.8 (*)    Monocytes Absolute 2.0 (*)    All other components within normal limits  INFLUENZA PANEL BY PCR (TYPE A & B) - Abnormal; Notable for the following components:   Influenza A By PCR POSITIVE (*)    All other components within normal limits  I-STAT CG4 LACTIC ACID, ED - Abnormal; Notable for the following components:   Lactic Acid, Venous 2.60 (*)    All other components within normal limits  CULTURE, BLOOD (ROUTINE X 2)  CULTURE, BLOOD (ROUTINE X 2)  URINE CULTURE  LIPASE, BLOOD  URINALYSIS, ROUTINE W REFLEX MICROSCOPIC  TROPONIN I  TROPONIN I  TROPONIN I  I-STAT TROPONIN, ED  I-STAT CG4 LACTIC ACID, ED  I-STAT TROPONIN, ED    EKG EKG Interpretation  Date/Time:  Saturday November 04 2018 11:57:00 EST Ventricular Rate:  140 PR Interval:    QRS Duration: 98 QT Interval:  302 QTC Calculation: 461 R Axis:   28 Text Interpretation:  Sinus tachycardia RSR' in V1 or V2, right VCD or RVH Consider anterior infarct Baseline wander in lead(s) V2 V3 When compared to prior, faster rate.  No STEMI Confirmed by Antony Blackbird [id number]) on 11/04/2018 12:08:24 PM   Radiology Dg Chest Port 1 View  Result Date: 11/04/2018 CLINICAL DATA:  Sepsis and hypoxia.  Cough and wheezing. EXAM: PORTABLE CHEST 1 VIEW COMPARISON:  05/03/2018 FINDINGS: Tapering of the peripheral pulmonary vasculature favors emphysema. Scattered calcified granulomas in the lungs. Heart size within normal limits. Atherosclerotic calcification of the aortic arch. Mild bony demineralization. IMPRESSION: 1. Emphysema with large lung volumes. 2. Old granulomatous disease. 3. Bony  demineralization. Electronically Signed   By: Van Clines M.D.   On: 11/04/2018 11:10    Procedures Procedures (including critical care time)  CRITICAL CARE Performed by: Gwenyth Allegra Tegeler Total critical care time: 45 minutes Critical care time was exclusive of separately billable procedures and treating other patients. Critical care was necessary to treat or prevent imminent or life-threatening deterioration. Critical care was time spent personally by me on the following activities: development of treatment plan with patient and/or surrogate as well as nursing, discussions with consultants, evaluation of patient's response to treatment, examination of patient, obtaining history from patient or surrogate, ordering and performing treatments and interventions, ordering and review of laboratory studies, ordering and review of radiographic studies, pulse oximetry and re-evaluation of patient's condition.  Medications Ordered in ED Medications  metroNIDAZOLE (FLAGYL) IVPB 500 mg (0 mg Intravenous Stopped 11/04/18 1341)  aspirin EC tablet 81 mg (has no administration in time range)  diltiazem (CARDIZEM CD) 24 hr capsule 240 mg (has no administration in time range)  metoprolol tartrate (LOPRESSOR) tablet 12.5 mg (has no administration in time range)  ALPRAZolam (XANAX) tablet 0.25 mg (has no administration in time range)  sertraline (ZOLOFT) tablet 50 mg (has no administration in time range)  pantoprazole (PROTONIX) EC tablet 40 mg (has no administration in time range)  budesonide (PULMICORT) nebulizer solution 0.5 mg (has no administration in time range)  fluticasone (FLONASE) 50 MCG/ACT nasal spray 2 spray (has no administration in time range)  loratadine (CLARITIN) tablet 10 mg (has no administration in time range)  montelukast (SINGULAIR) tablet 10 mg (has no administration in  time range)  HYDROcodone-acetaminophen (NORCO) 7.5-325 MG per tablet 1 tablet (has no administration in time  range)  enoxaparin (LOVENOX) injection 40 mg (has no administration in time range)  0.9 %  sodium chloride infusion (has no administration in time range)  morphine 2 MG/ML injection 2 mg (has no administration in time range)  insulin aspart (novoLOG) injection 0-9 Units (has no administration in time range)  insulin aspart (novoLOG) injection 0-5 Units (has no administration in time range)  vancomycin (VANCOCIN) IVPB 1000 mg/200 mL premix (has no administration in time range)  ceFEPIme (MAXIPIME) 2 g in sodium chloride 0.9 % 100 mL IVPB (has no administration in time range)  methylPREDNISolone sodium succinate (SOLU-MEDROL) 125 mg/2 mL injection 60 mg (has no administration in time range)  ipratropium-albuterol (DUONEB) 0.5-2.5 (3) MG/3ML nebulizer solution 3 mL (has no administration in time range)  oseltamivir (TAMIFLU) capsule 75 mg (has no administration in time range)  ipratropium-albuterol (DUONEB) 0.5-2.5 (3) MG/3ML nebulizer solution 3 mL (3 mLs Nebulization Given 11/04/18 1053)  magnesium sulfate IVPB 2 g 50 mL (0 g Intravenous Stopped 11/04/18 1158)  vancomycin (VANCOCIN) IVPB 1000 mg/200 mL premix (0 mg Intravenous Stopped 11/04/18 1225)  sodium chloride 0.9 % bolus 1,000 mL (0 mLs Intravenous Stopped 11/04/18 1242)    And  sodium chloride 0.9 % bolus 500 mL (0 mLs Intravenous Stopped 11/04/18 1257)  ceFEPIme (MAXIPIME) 2 g in sodium chloride 0.9 % 100 mL IVPB (0 g Intravenous Stopped 11/04/18 1153)     Initial Impression / Assessment and Plan / ED Course  I have reviewed the triage vital signs and the nursing notes.  Pertinent labs & imaging results that were available during my care of the patient were reviewed by me and considered in my medical decision making (see chart for details).     Megan [name] is a 68 y.o. female with past medical history significant for COPD, CHF, CAD with MI, hypertension, and prior pneumonia with sepsis who presents with fevers, chills, congestion, productive  cough, shortness of breath, hypoxia, wheezing, nausea, and malaise.  Patient reports that her symptoms have been ongoing for the last 2 days.  She called for help today when her oxygen at home was not helping her symptoms.  She reports that she does take some oxygen at home.  Patient was found to have oxygen saturations in the 70s with EMS.  Patient was coughing up a yellow sputum.  Patient had vital signs of 103 temperature orally, tachycardia and tachypnea with EMS.  Concern for sepsis with likely source of pneumonia with her respiratory symptoms.  On arrival, patient is still concern for sepsis.  On exam, lungs were coarse and wheezing.  Patient was breathing in the 20 and 30 times per minute.  Patient did have some coarseness and wheezing, no crackles in the lungs.  Legs were nonedematous.  Lower suspicion for heart failure exacerbation.  More concerned about sepsis.  Chest was nontender and abdomen was nontender.  Patient will be made a code sepsis and get broad-spectrum antibiotics.  She will be placed on BiPAP.  Patient already received Solu-Medrol with EMS, will give magnesium and another breathing treatment.  Anticipate admission.  At this time, will use BiPAP.  12:19 PM Patient is breathing better on BiPAP.  Initial troponin negative.  Lactic acid elevated at 2.6.  Leukocytosis also present.  Chest x-ray does not show pneumonia however clinically I am still concerned about pneumonia.  Patient will be admitted for further management of  sepsis likely due to pneumonia.  Patient still awaiting results of flu test.   Final Clinical Impressions(s) / ED Diagnoses   Final diagnoses:  Sepsis, due to unspecified organism, unspecified whether acute organ dysfunction present [facility])  Respiratory distress  Acute respiratory failure with hypoxia (HCC)  Shortness of breath  Cough  COPD exacerbation [facility])    ED Discharge Orders    None     Clinical Impression: 1. Sepsis, due to unspecified  organism, unspecified whether acute organ dysfunction present ([place])   2. Respiratory distress   3. Acute respiratory failure with hypoxia (HCC)   4. Shortness of breath   5. Cough   6. COPD exacerbation ([place])     Disposition: Admit  This note was prepared with assistance of Dragon voice recognition software. Occasional wrong-word or sound-a-like substitutions may have occurred due to the inherent limitations of voice recognition software.     Tegeler, Gwenyth Allegra, MD 11/04/18 [phone]

## 2018-11-04 NOTE — Progress Notes (Signed)
Pharmacy Antibiotic Note  Megan [name] is a 68 y.o. female admitted on 11/04/2018 with sepsis.  Pharmacy has been consulted for vancomycin and cefepime dosing.  Specific Drug That Caused Reaction: Penicillin Antibiotics tolerated in past  From Historical Data in EMR: Ceftriaxone and cefepime Reactions that Occurred: Rash / hives Information Received from: Medical Record  Based on the above interview, it has been deemed appropriate for patient to be administered Cefepime as risk for cross-reactivity to documented reaction is low  Plan: Give cefepime 2g IV x 1, then start cefepime 2g IV Q24h Give vancomycin 1g IV x 1, then start vancomycin 1g IV Q24h Start Flagyl 500mg  IV Q8h per MD Monitor clinical picture, renal function, vanc levels prn F/U C&S, abx deescalation / LOT  No data recorded.  No results for input(s): WBC, CREATININE, LATICACIDVEN, VANCOTROUGH, VANCOPEAK, VANCORANDOM, GENTTROUGH, GENTPEAK, GENTRANDOM, TOBRATROUGH, TOBRAPEAK, TOBRARND, AMIKACINPEAK, AMIKACINTROU, AMIKACIN in the last 168 hours.  CrCl cannot be calculated (Patient's most recent lab result is older than the maximum 21 days allowed.).    Allergies  Allergen Reactions  . Penicillins Rash and Hives    Has patient had a PCN reaction causing immediate rash, facial/tongue/throat swelling, SOB or lightheadedness with hypotension: No Has patient had a PCN reaction causing severe rash involving mucus membranes or skin necrosis:NO Has patient had a PCN reaction that required hospitalization No Has patient had a PCN reaction occurring within the last 10 years:NO If all of the above answers are "NO", then may proceed with Cephalosporin use.    Thank you for allowing pharmacy to be a part of this patient's care.  Reginia Naas 11/04/2018 10:45 AM

## 2018-11-04 NOTE — ED Notes (Signed)
Attempted report x1. 

## 2018-11-04 NOTE — H&P (Addendum)
[facility]                                                                                    Patient Demographics  Megan [name], is a 68 y.o. female  CSN: [id number]  MRN: [id number]  DOB - 12/13/1950  Admit Date - 11/04/2018  Outpatient Primary MD for the patient is [name], MD   With History of -  Past Medical History:  Diagnosis Date  . Anemia   . Anxiety state 08/20/2015  . CAP (community acquired pneumonia)   . CHF (congestive heart failure) ([place])   . COPD (chronic obstructive pulmonary disease) ([place])   . Depression   . Essential hypertension 08/19/2015  . GERD (gastroesophageal reflux disease)   . Headache   . History of hiatal hernia   . Myocardial infarction ([place])   . Shortness of breath dyspnea       Past Surgical History:  Procedure Laterality Date  . CARDIAC CATHETERIZATION    . CARDIAC CATHETERIZATION N/A 08/26/2015   Procedure: Left Heart Cath and Coronary Angiography;  Surgeon: Jettie Booze, MD;  Location: [place];  Service: Cardiovascular;  Laterality: N/A;  . ESOPHAGOGASTRODUODENOSCOPY N/A 04/18/2018   Procedure: ESOPHAGOGASTRODUODENOSCOPY (EGD);  Surgeon: Ladene Artist, MD;  Location: [facility] ENDOSCOPY;  Service: Endoscopy;  Laterality: N/A;  . tracheostomy      in for   Chief Complaint  Patient presents with  . Respiratory Distress     HPI  Megan [name]  is a 68 y.o. female, with past medical history significant for COPD, hypertension and coronary artery disease status post MI in the past presenting today with worsening shortness of breath, chest discomfort with fever chills and wheezing for the last 2 days.  No history of chest pains syncope or vomiting .  Reports nausea but no vomiting, no abdominal pain. In the emergency room the patient was noted to be severely hypoxic and was placed on BiPAP for support.  Flu test is pending.  When I saw the patient she was more comfortable.    Review of Systems     In addition to the HPI above,  No Fever-chills, No Headache, No changes with Vision or hearing, No problems swallowing food or Liquids, No Abdominal pain, Bowel movements are regular, No Blood in stool or Urine, No dysuria, No new skin rashes or bruises, No new joints pains-aches,  No new weakness, tingling, numbness in any extremity, No recent weight gain or loss, No polyuria, polydypsia or polyphagia, No significant Mental Stressors.  A full 10 point Review of Systems was done, except as stated above, all other Review of Systems were negative.   Social History Social History   Tobacco Use  . Smoking status: Former Smoker    Packs/day: 1.00    Years: 30.00    Pack years: 30.00    Types: Cigarettes    Last attempt to quit: 08/18/2000    Years since quitting: 18.2  . Smokeless tobacco: Never Used  Substance Use Topics  . Alcohol use: No     Family History Family History  Problem Relation Age of Onset  . Cirrhosis Mother   .  Depression Mother   . Heart disease Father      Prior to Admission medications   Medication Sig Start Date End Date Taking? Authorizing Provider  ALPRAZolam Duanne Moron) 0.25 MG tablet TAKE 1 TABLET(0.25 MG) BY MOUTH TWICE DAILY AS NEEDED FOR ANXIETY 08/10/18   [name], MD  aspirin EC 81 MG tablet Take 81 mg by mouth daily.    [provider]  budesonide (PULMICORT) 0.5 MG/2ML nebulizer solution Take 2 mLs (0.5 mg total) by nebulization 2 (two) times daily. 03/08/17 04/07/17  Damita Lack, MD  butalbital-acetaminophen-caffeine (FIORICET, ESGIC) [phone] MG tablet Take 1 tablet by mouth 2 (two) times daily as needed for headache. 02/10/18   [name], MD  diltiazem (CARDIZEM CD) 240 MG 24 hr capsule Take 1 capsule (240 mg total) by mouth daily. 02/27/18   Roxan Hockey, MD  feeding supplement, ENSURE ENLIVE, (ENSURE ENLIVE) LIQD Take 237 mLs by mouth 4 (four) times daily. 01/11/17   Eugenie Filler, MD  fluticasone (FLONASE)  50 MCG/ACT nasal spray Place 2 sprays into both nostrils daily. 01/04/18   Marrian Salvage, FNP  HYDROcodone-acetaminophen (NORCO) 7.5-325 MG tablet Take 1 tablet by mouth every 6 (six) hours as needed for moderate pain. 08/10/18   [name], MD  levalbuterol Penne Lash) 1.25 MG/0.5ML nebulizer solution Take 1.25 mg by nebulization every 6 (six) hours. 02/27/18   Roxan Hockey, MD  lidocaine (LIDODERM) 5 % Place 1 patch onto the skin daily. Remove & Discard patch within 12 hours or as directed by MD 04/21/18   Shelly Coss, MD  loratadine (CLARITIN) 10 MG tablet Take 1 tablet (10 mg total) daily by mouth. 09/16/17   [name], MD  metoprolol tartrate (LOPRESSOR) 25 MG tablet Take 0.5 tablets (12.5 mg total) by mouth 2 (two) times daily. 02/27/18   Roxan Hockey, MD  montelukast (SINGULAIR) 10 MG tablet Take 1 tablet (10 mg total) by mouth at bedtime. 09/22/18   Martyn Ehrich, NP  pantoprazole (PROTONIX) 40 MG tablet Take 1 tablet (40 mg total) by mouth daily. 02/28/18   Roxan Hockey, MD  polyethylene glycol (MIRALAX / GLYCOLAX) packet Take 17 g by mouth 2 (two) times daily. 01/11/17   Eugenie Filler, MD  predniSONE (DELTASONE) 10 MG tablet Take 4 tablets for 3 days, 3 tablets for 3 days, 2 tablets for 3 days, 1 tablet for 3 days 06/08/18   Deneise Lever, MD  predniSONE (DELTASONE) 10 MG tablet 40 mg X 3 days, 30mg  X 3 days, 20mg  X 3 days, 10mg  X 3 days, then stop 08/04/18   Collene Gobble, MD  predniSONE (DELTASONE) 10 MG tablet Take 4 tabs for 2 days, then 3 tabs for 2 days, 2 tabs for 2 days, then 1 tab for 2 days, then stop. 09/08/18   Parrett, Fonnie Mu, NP  senna-docusate (SENOKOT-S) 8.6-50 MG tablet Take 1 tablet by mouth 2 (two) times daily. 02/27/18   Roxan Hockey, MD  sertraline (ZOLOFT) 50 MG tablet Take 1 tablet (50 mg total) by mouth daily. 02/27/18   Roxan Hockey, MD  tiZANidine (ZANAFLEX) 4 MG tablet TAKE 1 TABLET(4 MG) BY MOUTH EVERY 6 HOURS AS NEEDED FOR  MUSCLE SPASMS 09/16/17   [name], MD  TRELEGY ELLIPTA 100-62.5-25 MCG/INH AEPB INHALE 1 PUFF INTO THE LUNGS DAILY 10/26/18   Collene Gobble, MD  VENTOLIN HFA 108 (90 Base) MCG/ACT inhaler INHALE 2 PUFFS INTO THE LUNGS EVERY 6 HOURS AS NEEDED  FOR WHEEZING OR SHORTNESS OF BREATH 11/02/18   Collene Gobble, MD    Allergies  Allergen Reactions  . Penicillins Rash and Hives    Has patient had a PCN reaction causing immediate rash, facial/tongue/throat swelling, SOB or lightheadedness with hypotension: No Has patient had a PCN reaction causing severe rash involving mucus membranes or skin necrosis:NO Has patient had a PCN reaction that required hospitalization No Has patient had a PCN reaction occurring within the last 10 years:NO If all of the above answers are "NO", then may proceed with Cephalosporin use.    Physical Exam  Vitals  Blood pressure (!) 107/50, pulse (!) 125, temperature 99.9 F (37.7 C), temperature source Axillary, resp. rate (!) 28, SpO2 98 %.   1. General elderly, in moderate shortness of breath  2. Normal affect and insight, Not Suicidal or Homicidal, Awake Alert, Oriented X 3.  3. No F.N deficits, grossly, patient is more alert awake and moving all extremities at this time.  4. Ears and Eyes appear Normal, Conjunctivae clear, PERRLA. Moist Oral Mucosa.  5. Supple Neck, No JVD, No cervical lymphadenopathy appriciated, No Carotid Bruits.  6. Symmetrical Chest wall movement, scattered rhonchi and wheezing.  7. RRR, No Gallops, Rubs or Murmurs, No Parasternal Heave.  8. Positive Bowel Sounds, Abdomen Soft, Non tender,.  9.  No Cyanosis, Normal Skin Turgor, No Skin Rash or Bruise.  10. Good muscle tone,  joints appear normal , no effusions, Normal ROM.    Data Review  CBC Recent Labs  Lab 11/04/18 1122  WBC 13.7*  HGB 12.2  HCT 40.9  PLT 310  MCV 96.9  MCH 28.9  MCHC 29.8*  RDW 12.7  LYMPHSABS 3.9  MONOABS 2.0*  EOSABS 0.0  BASOSABS 0.1    ------------------------------------------------------------------------------------------------------------------  Chemistries  Recent Labs  Lab 11/04/18 1122  NA 136  K 3.9  CL 94*  CO2 32  GLUCOSE 187*  BUN 10  CREATININE 0.84  CALCIUM 8.3*  AST 28  ALT 18  ALKPHOS 58  BILITOT 0.4   ------------------------------------------------------------------------------------------------------------------ CrCl cannot be calculated (Unknown ideal weight.). ------------------------------------------------------------------------------------------------------------------ No results for input(s): TSH, T4TOTAL, T3FREE, THYROIDAB in the last 72 hours.  Invalid input(s): FREET3   Coagulation profile No results for input(s): INR, PROTIME in the last 168 hours. ------------------------------------------------------------------------------------------------------------------- No results for input(s): DDIMER in the last 72 hours. -------------------------------------------------------------------------------------------------------------------  Cardiac Enzymes No results for input(s): CKMB, TROPONINI, MYOGLOBIN in the last 168 hours.  Invalid input(s): CK ------------------------------------------------------------------------------------------------------------------ Invalid input(s): POCBNP   ---------------------------------------------------------------------------------------------------------------  Urinalysis    Component Value Date/Time   COLORURINE STRAW (A) 04/16/2018 1617   APPEARANCEUR CLEAR 04/16/2018 1617   LABSPEC 1.008 04/16/2018 1617   PHURINE 9.0 (H) 04/16/2018 1617   GLUCOSEU NEGATIVE 04/16/2018 1617   GLUCOSEU NEGATIVE 01/04/2018 1406   HGBUR NEGATIVE 04/16/2018 1617   BILIRUBINUR NEGATIVE 04/16/2018 1617   KETONESUR NEGATIVE 04/16/2018 1617   PROTEINUR NEGATIVE 04/16/2018 1617   UROBILINOGEN 0.2 01/04/2018 1406   NITRITE NEGATIVE 04/16/2018 1617    LEUKOCYTESUR NEGATIVE 04/16/2018 1617    ----------------------------------------------------------------------------------------------------------------   Imaging results:   Dg Chest Port 1 View  Result Date: 11/04/2018 CLINICAL DATA:  Sepsis and hypoxia.  Cough and wheezing. EXAM: PORTABLE CHEST 1 VIEW COMPARISON:  05/03/2018 FINDINGS: Tapering of the peripheral pulmonary vasculature favors emphysema. Scattered calcified granulomas in the lungs. Heart size within normal limits. Atherosclerotic calcification of the aortic arch. Mild bony demineralization. IMPRESSION: 1. Emphysema with large lung volumes. 2. Old granulomatous disease. 3. Bony  demineralization. Electronically Signed   By: Van Clines M.D.   On: 11/04/2018 11:10    My personal review of EKG: Rhythm NSR, sinus tach at 140 bpm   assessment & Plan  Respiratory failure/COPD exacerbation/flu just came back positive Continue with BiPAP Nebulizer treatment IV steroids Continue with IV antibiotics Start Tamiflu  Hypertension Continue Lopressor/Cardizem  Anxiety Continue Xanax  Congestive heart failure With normal ejection fraction on 06/9372 and diastolic components  GERD Continue with Protonix    DVT Prophylaxis Lovenox  AM Labs Ordered, also please review Full Orders    Code Status full  Disposition Plan: Home  Time spent in minutes : 44 minutes  Condition GUARDED   @SIGNATURE @

## 2018-11-04 NOTE — Progress Notes (Signed)
Pt remains free of distress.  Pt transported on [place] without any complications. Report called to floor RT.  BiPAP was not transported.

## 2018-11-04 NOTE — ED Notes (Signed)
Updated pt's daughter of pt condition with permission of pt and EDP

## 2018-11-04 NOTE — ED Notes (Signed)
ORDERED DIET TRAY FOR PT  

## 2018-11-04 NOTE — ED Notes (Signed)
Patient placed on bipap, tolerating well at this time

## 2018-11-04 NOTE — Progress Notes (Signed)
Pt is resting comfortably at this time.  HR-1007, RR-21, SpO2-98 on 3L.  BiPAP in not needed at this time and in on stand-by in pt's room.  RT will continue to monitor and will place pt on BiPAP if needed or requested.

## 2018-11-04 NOTE — ED Triage Notes (Addendum)
Pt arrives via [facility] for c/o resp distress, ems reports they were called out for patient having difficulty breathing, pt has been coughing up thick phlegm. 76% O2 sat on RA, wears 2L O2 at baseline, HR 150, pt in tripod position with respirations labored, accessory muscle use. Hx of tracheostomy. Temp 103 per EMS. A/ox4. Pt received 125 mg solumedrol, 0.5 atrovent,10 albuterol, 4mg  zofran en route.

## 2018-11-04 NOTE — ED Notes (Signed)
Pt tolerating 4L [place], O2 sats have maintained 96-99%.

## 2018-11-04 NOTE — ED Notes (Signed)
ED Provider at bedside. 

## 2018-11-04 NOTE — ED Notes (Signed)
Pt taken off Bi-pap. Pt placed on 4L [place].

## 2018-11-05 ENCOUNTER — Other Ambulatory Visit: Payer: Self-pay

## 2018-11-05 LAB — CBC
HCT: 32.4 % — ABNORMAL LOW (ref 36.0–46.0)
Hemoglobin: 10.3 g/dL — ABNORMAL LOW (ref 12.0–15.0)
MCH: 30.9 pg (ref 26.0–34.0)
MCHC: 31.8 g/dL (ref 30.0–36.0)
MCV: 97.3 fL (ref 80.0–100.0)
Platelets: 211 10*3/uL (ref 150–400)
RBC: 3.33 MIL/uL — ABNORMAL LOW (ref 3.87–5.11)
RDW: 12.8 % (ref 11.5–15.5)
WBC: 5.8 10*3/uL (ref 4.0–10.5)
nRBC: 0 % (ref 0.0–0.2)

## 2018-11-05 LAB — URINE CULTURE: Culture: NO GROWTH

## 2018-11-05 LAB — TROPONIN I
Troponin I: 0.06 ng/mL (ref <0.03)
Troponin I: 0.04 ng/mL (ref <0.03)
Troponin I: <0.03 ng/mL (ref <0.03)

## 2018-11-05 LAB — BASIC METABOLIC PANEL
Anion gap: 8 (ref 5–15)
BUN: 12 mg/dL (ref 8–23)
CO2: 31 mmol/L (ref 22–32)
CREATININE: 0.84 mg/dL (ref 0.44–1.00)
Calcium: 8.1 mg/dL — ABNORMAL LOW (ref 8.9–10.3)
Chloride: 99 mmol/L (ref 98–111)
GFR calc Af Amer: >60 mL/min (ref >60)
GFR calc non Af Amer: >60 mL/min (ref >60)
Glucose, Bld: 201 mg/dL — ABNORMAL HIGH (ref 70–99)
Potassium: 3.8 mmol/L (ref 3.5–5.1)
Sodium: 138 mmol/L (ref 135–145)

## 2018-11-05 LAB — GLUCOSE, CAPILLARY
Glucose-Capillary: 162 mg/dL — ABNORMAL HIGH (ref 70–99)
Glucose-Capillary: 205 mg/dL — ABNORMAL HIGH (ref 70–99)
Glucose-Capillary: 101 mg/dL — ABNORMAL HIGH (ref 70–99)
Glucose-Capillary: 318 mg/dL — ABNORMAL HIGH (ref 70–99)

## 2018-11-05 LAB — MRSA PCR SCREENING: MRSA by PCR: POSITIVE — AB

## 2018-11-05 MED ORDER — GUAIFENESIN ER 600 MG PO TB12
600.0000 mg | ORAL_TABLET | Freq: Two times a day (BID) | ORAL | Status: DC
  Administered 2018-11-05 – 2018-11-10 (×11): 600 mg via ORAL
  Filled 2018-11-05 (×11): qty 1

## 2018-11-05 MED ORDER — BOOST / RESOURCE BREEZE PO LIQD CUSTOM
1.0000 | Freq: Three times a day (TID) | ORAL | Status: DC
  Administered 2018-11-05: 237 mL via ORAL
  Administered 2018-11-05: 1 via ORAL
  Administered 2018-11-06 (×2): via ORAL
  Administered 2018-11-08 – 2018-11-10 (×5): 1 via ORAL

## 2018-11-05 MED ORDER — DEXTROMETHORPHAN POLISTIREX ER 30 MG/5ML PO SUER
30.0000 mg | Freq: Two times a day (BID) | ORAL | Status: DC
  Administered 2018-11-05 – 2018-11-10 (×11): 30 mg via ORAL
  Filled 2018-11-05 (×15): qty 5

## 2018-11-05 MED ORDER — OSELTAMIVIR PHOSPHATE 30 MG PO CAPS
30.0000 mg | ORAL_CAPSULE | Freq: Two times a day (BID) | ORAL | Status: AC
  Administered 2018-11-05 – 2018-11-08 (×7): 30 mg via ORAL
  Filled 2018-11-05 (×7): qty 1

## 2018-11-05 MED ORDER — MENTHOL 3 MG MT LOZG
1.0000 | LOZENGE | OROMUCOSAL | Status: DC | PRN
  Administered 2018-11-09: 3 mg via ORAL
  Filled 2018-11-05: qty 9

## 2018-11-05 MED ORDER — IPRATROPIUM-ALBUTEROL 0.5-2.5 (3) MG/3ML IN SOLN
3.0000 mL | Freq: Two times a day (BID) | RESPIRATORY_TRACT | Status: DC
  Administered 2018-11-05 – 2018-11-07 (×4): 3 mL via RESPIRATORY_TRACT
  Filled 2018-11-05 (×4): qty 3

## 2018-11-05 MED ORDER — BENZONATATE 100 MG PO CAPS
100.0000 mg | ORAL_CAPSULE | Freq: Three times a day (TID) | ORAL | Status: DC
  Administered 2018-11-05 – 2018-11-10 (×15): 100 mg via ORAL
  Filled 2018-11-05 (×15): qty 1

## 2018-11-05 NOTE — Plan of Care (Signed)
  Problem: Activity: Goal: Ability to tolerate increased activity will improve Outcome: Progressing   Problem: Clinical Measurements: Goal: Ability to maintain a body temperature in the normal range will improve Outcome: Progressing   Problem: Respiratory: Goal: Ability to maintain adequate ventilation will improve Outcome: Progressing   Problem: Respiratory: Goal: Ability to maintain a clear airway will improve Outcome: Progressing

## 2018-11-05 NOTE — Progress Notes (Signed)
[facility] Progress Note  Patient: Megan [name] UYQ:[id number]   PCP: [name], MD DOB: 1950-11-15   DOA: 11/04/2018   DOS: 11/05/2018   Date of Service: the patient was seen and examined on 11/05/2018  Brief hospital course: Pt. with PMH of COPD, chronic diastolic CHF, COPD, depression, HTN, CAD; admitted on 11/04/2018, presented with complaint of shortness of breath, was found to have COPD exacerbation secondary to influenza A. Currently further plan is continue current care.  Subjective: Still having shortness of breath and cough.  No chest pain no nausea no vomiting.  No fever no chills anymore.  Telemetry: SVT  Assessment and Plan: 1.  COPD exacerbation. Acute on chronic hypoxic respiratory failure. Influenza PCR. Needed BiPAP overnight, currently on all oxygen. Influenza a positive. Continue Tamiflu. Was also started on IV antibiotics will currently hold it and monitor. Continue with IV steroids, continue nebulization.  2.  Dysphagia. Esophagram shows large [place] diverticulum. EGD showed nonbleeding diverticulum with food still in the distal esophagus. Continue soft diet.  3.  Essential HTN. Blood pressure soft. Currently on hold.  4.  Depression. Continue Zoloft.  5.  Steroid-induced hyperglycemia. On sliding scale insulin. We will probably be able to stop it tomorrow.  6. Anemia unidentified etiology. Baseline hemoglobin around 10-11. MCV normal. Continue to monitor for now. No active bleeding reported.  7.  Underweight.  Body mass index is 16.41 kg/m.  Continue to monitor. Dietary consultation. Continue Ensure.  Diet: soft diet DVT Prophylaxis: subcutaneous Heparin  Advance goals of care discussion: full code  Family Communication: no family was present at bedside, at the time of interview.   Disposition:  Discharge to home.  Consultants: none Procedures: none  Scheduled Meds: . ALPRAZolam  0.25 mg Oral BID  . aspirin EC  81 mg Oral  Daily  . benzonatate  100 mg Oral TID  . budesonide  0.5 mg Nebulization BID  . dextromethorphan  30 mg Oral BID  . enoxaparin (LOVENOX) injection  40 mg Subcutaneous Q24H  . feeding supplement  1 Container Oral TID BM  . fluticasone  2 spray Each Nare Daily  . guaiFENesin  600 mg Oral BID  . insulin aspart  0-5 Units Subcutaneous QHS  . insulin aspart  0-9 Units Subcutaneous TID WC  . ipratropium-albuterol  3 mL Nebulization BID  . loratadine  10 mg Oral Daily  . methylPREDNISolone (SOLU-MEDROL) injection  60 mg Intravenous Q6H  . montelukast  10 mg Oral QHS  . oseltamivir  30 mg Oral BID  . pantoprazole  40 mg Oral Daily  . sertraline  50 mg Oral Daily   Continuous Infusions: PRN Meds: HYDROcodone-acetaminophen, menthol-cetylpyridinium Antibiotics: Anti-infectives (From admission, onward)   Start     Dose/Rate Route Frequency Ordered Stop   11/05/18 2200  oseltamivir (TAMIFLU) capsule 30 mg     30 mg Oral 2 times daily 11/05/18 1330 11/09/18 0959   11/05/18 1000  vancomycin (VANCOCIN) IVPB 1000 mg/200 mL premix  Status:  Discontinued     1,000 mg 200 mL/hr over 60 Minutes Intravenous Every 24 hours 11/04/18 1259 11/05/18 1127   11/05/18 1000  ceFEPIme (MAXIPIME) 2 g in sodium chloride 0.9 % 100 mL IVPB  Status:  Discontinued     2 g 200 mL/hr over 30 Minutes Intravenous Every 24 hours 11/04/18 1259 11/05/18 1127   11/04/18 1500  oseltamivir (TAMIFLU) capsule 75 mg  Status:  Discontinued     75 mg Oral 2 times daily 11/04/18  1449 11/05/18 1330   11/04/18 1100  ceFEPIme (MAXIPIME) 2 g in sodium chloride 0.9 % 100 mL IVPB     2 g 200 mL/hr over 30 Minutes Intravenous  Once 11/04/18 1046 11/04/18 1153   11/04/18 1045  aztreonam (AZACTAM) 2 g in sodium chloride 0.9 % 100 mL IVPB  Status:  Discontinued     2 g 200 mL/hr over 30 Minutes Intravenous  Once 11/04/18 1042 11/04/18 1046   11/04/18 1045  metroNIDAZOLE (FLAGYL) IVPB 500 mg  Status:  Discontinued     500 mg 100 mL/hr over  60 Minutes Intravenous Every 8 hours 11/04/18 1042 11/05/18 0800   11/04/18 1045  vancomycin (VANCOCIN) IVPB 1000 mg/200 mL premix     1,000 mg 200 mL/hr over 60 Minutes Intravenous  Once 11/04/18 1042 11/04/18 1225       Objective: Physical Exam: Vitals:   11/05/18 0312 11/05/18 0811 11/05/18 0844 11/05/18 1209  BP: (!) 100/51 (!) 114/57  (!) 133/55  Pulse: 89 73  88  Resp: 18 19  19   Temp: 99.7 F (37.6 C) 99.6 F (37.6 C)  99.5 F (37.5 C)  TempSrc: Axillary Axillary  Axillary  SpO2: 100% 90% 99% 100%  Weight:      Height:        Intake/Output Summary (Last 24 hours) at 11/05/2018 1840 Last data filed at 11/05/2018 1429 Gross per 24 hour  Intake 551.39 ml  Output 651 ml  Net -99.61 ml   Filed Weights   11/04/18 2217  Weight: 40.7 kg   General: Alert, Awake and Oriented to Time, Place and Person. Appear in moderate distress, affect appropriate Eyes: PERRL, Conjunctiva normal ENT: Oral Mucosa clear moist. Neck: no JVD, no Abnormal Mass Or lumps Cardiovascular: S1 and S2 Present, no Murmur, Peripheral Pulses Present Respiratory: increased  respiratory effort, Bilateral Air entry equal and Decreased, no use of accessory muscle, bilateral  Crackles, bilateral  wheezes Abdomen: Bowel Sound present, Soft and no tenderness, no hernia Skin: no redness, no Rash, no induration Extremities: no Pedal edema, no calf tenderness Neurologic: Grossly no focal neuro deficit. Bilaterally Equal motor strength  Data Reviewed: CBC: Recent Labs  Lab 11/04/18 1122 11/05/18 0221  WBC 13.7* 5.8  NEUTROABS 7.7  --   HGB 12.2 10.3*  HCT 40.9 32.4*  MCV 96.9 97.3  PLT 310 244   Basic Metabolic Panel: Recent Labs  Lab 11/04/18 1122 11/05/18 0221  NA 136 138  K 3.9 3.8  CL 94* 99  CO2 32 31  GLUCOSE 187* 201*  BUN 10 12  CREATININE 0.84 0.84  CALCIUM 8.3* 8.1*    Liver Function Tests: Recent Labs  Lab 11/04/18 1122  AST 28  ALT 18  ALKPHOS 58  BILITOT 0.4  PROT 6.5    ALBUMIN 3.8   Recent Labs  Lab 11/04/18 1122  LIPASE 26   No results for input(s): AMMONIA in the last 168 hours. Coagulation Profile: No results for input(s): INR, PROTIME in the last 168 hours. Cardiac Enzymes: Recent Labs  Lab 11/05/18 0221 11/05/18 0827 11/05/18 1421  TROPONINI 0.06* 0.04* <0.03   BNP (last 3 results) No results for input(s): PROBNP in the last 8760 hours. CBG: Recent Labs  Lab 11/04/18 1743 11/04/18 2319 11/05/18 0811 11/05/18 1208 11/05/18 1648  GLUCAP 171* 259* 162* 205* 101*   Studies: No results found.   Time spent: 35 minutes  Author: Berle Mull, MD [facility] Pager: [phone] 11/05/2018 6:40 PM  Between 7PM-7AM,  please contact night-coverage at [email or web address], password [facility]

## 2018-11-06 LAB — BASIC METABOLIC PANEL
Anion gap: 3 — ABNORMAL LOW (ref 5–15)
BUN: 17 mg/dL (ref 8–23)
CHLORIDE: 97 mmol/L — AB (ref 98–111)
CO2: 35 mmol/L — ABNORMAL HIGH (ref 22–32)
Calcium: 8.4 mg/dL — ABNORMAL LOW (ref 8.9–10.3)
Creatinine, Ser: 0.58 mg/dL (ref 0.44–1.00)
GFR calc Af Amer: >60 mL/min (ref >60)
GFR calc non Af Amer: >60 mL/min (ref >60)
Glucose, Bld: 153 mg/dL — ABNORMAL HIGH (ref 70–99)
POTASSIUM: 4.7 mmol/L (ref 3.5–5.1)
Sodium: 135 mmol/L (ref 135–145)

## 2018-11-06 LAB — GLUCOSE, CAPILLARY
GLUCOSE-CAPILLARY: 164 mg/dL — AB (ref 70–99)
GLUCOSE-CAPILLARY: 208 mg/dL — AB (ref 70–99)
Glucose-Capillary: 260 mg/dL — ABNORMAL HIGH (ref 70–99)
Glucose-Capillary: 114 mg/dL — ABNORMAL HIGH (ref 70–99)

## 2018-11-06 LAB — CBC
HCT: 36.2 % (ref 36.0–46.0)
Hemoglobin: 11.3 g/dL — ABNORMAL LOW (ref 12.0–15.0)
MCH: 30.2 pg (ref 26.0–34.0)
MCHC: 31.2 g/dL (ref 30.0–36.0)
MCV: 96.8 fL (ref 80.0–100.0)
Platelets: 263 10*3/uL (ref 150–400)
RBC: 3.74 MIL/uL — AB (ref 3.87–5.11)
RDW: 12.7 % (ref 11.5–15.5)
WBC: 15.7 10*3/uL — ABNORMAL HIGH (ref 4.0–10.5)
nRBC: 0 % (ref 0.0–0.2)

## 2018-11-06 MED ORDER — METHOCARBAMOL 500 MG PO TABS
500.0000 mg | ORAL_TABLET | Freq: Three times a day (TID) | ORAL | Status: DC | PRN
  Administered 2018-11-06 – 2018-11-09 (×2): 500 mg via ORAL
  Filled 2018-11-06 (×2): qty 1

## 2018-11-06 MED ORDER — PREDNISONE 20 MG PO TABS
50.0000 mg | ORAL_TABLET | Freq: Every day | ORAL | Status: DC
  Administered 2018-11-07 – 2018-11-10 (×4): 50 mg via ORAL
  Filled 2018-11-06 (×6): qty 2

## 2018-11-06 MED ORDER — MUPIROCIN 2 % EX OINT
1.0000 "application " | TOPICAL_OINTMENT | Freq: Two times a day (BID) | CUTANEOUS | Status: DC
  Administered 2018-11-06 – 2018-11-10 (×7): 1 via NASAL
  Filled 2018-11-06 (×3): qty 22

## 2018-11-06 MED ORDER — ORAL CARE MOUTH RINSE
15.0000 mL | Freq: Two times a day (BID) | OROMUCOSAL | Status: DC
  Administered 2018-11-06 – 2018-11-09 (×6): 15 mL via OROMUCOSAL

## 2018-11-06 MED ORDER — CHLORHEXIDINE GLUCONATE CLOTH 2 % EX PADS
6.0000 | MEDICATED_PAD | Freq: Every day | CUTANEOUS | Status: DC
  Administered 2018-11-06 – 2018-11-10 (×4): 6 via TOPICAL

## 2018-11-06 NOTE — Progress Notes (Signed)
[facility] Progress Note  Patient: Megan [name] EUM:[id number]   PCP: [name], MD DOB: [birth year]   DOA: 11/04/2018   DOS: 11/06/2018   Date of Service: the patient was seen and examined on 11/06/2018  Brief hospital course: Pt. with PMH of COPD, chronic diastolic CHF, COPD, depression, HTN, CAD; admitted on 11/04/2018, presented with complaint of shortness of breath, was found to have COPD exacerbation secondary to influenza A. Currently further plan is continue current care.  Subjective: Reports headache, generalized body ache.  No nausea no vomiting no fever no chills.  Still having shortness of breath.  Telemetry: Sinus tachycardia  Assessment and Plan: 1.  COPD exacerbation. Acute on chronic hypoxic respiratory failure. Influenza PCR. Needed BiPAP overnight, currently on all oxygen. Influenza a positive. Continue Tamiflu. Was also started on IV antibiotics will currently hold it and monitor. Continue with IV steroids, continue nebulization. Switch to oral prednisone, add Robaxin  2.  Dysphagia. Esophagram shows large widemouth diverticulum. EGD showed nonbleeding diverticulum with food still in the distal esophagus. Continue soft diet.  3.  Essential HTN. Blood pressure soft. Currently on hold.  4.  Depression. Continue Zoloft.  5.  Steroid-induced hyperglycemia. On sliding scale insulin. We will probably be able to stop it tomorrow.  6. Anemia unidentified etiology. Baseline hemoglobin around 10-11. MCV normal. Continue to monitor for now. No active bleeding reported.  7.  Underweight.  Body mass index is 16.41 kg/m.  Continue to monitor. Dietary consultation. Continue Ensure.  Diet: soft diet DVT Prophylaxis: subcutaneous Heparin  Advance goals of care discussion: full code  Family Communication: no family was present at bedside, at the time of interview.   Disposition:  Discharge to home.  Consultants: none Procedures: none  Scheduled  Meds: . ALPRAZolam  0.25 mg Oral BID  . aspirin EC  81 mg Oral Daily  . benzonatate  100 mg Oral TID  . budesonide  0.5 mg Nebulization BID  . Chlorhexidine Gluconate Cloth  6 each Topical Q0600  . dextromethorphan  30 mg Oral BID  . enoxaparin (LOVENOX) injection  40 mg Subcutaneous Q24H  . feeding supplement  1 Container Oral TID BM  . fluticasone  2 spray Each Nare Daily  . guaiFENesin  600 mg Oral BID  . insulin aspart  0-5 Units Subcutaneous QHS  . insulin aspart  0-9 Units Subcutaneous TID WC  . ipratropium-albuterol  3 mL Nebulization BID  . loratadine  10 mg Oral Daily  . mouth rinse  15 mL Mouth Rinse BID  . montelukast  10 mg Oral QHS  . mupirocin ointment  1 application Nasal BID  . oseltamivir  30 mg Oral BID  . pantoprazole  40 mg Oral Daily  . [START ON 11/07/2018] predniSONE  50 mg Oral Q breakfast  . sertraline  50 mg Oral Daily   Continuous Infusions: PRN Meds: HYDROcodone-acetaminophen, menthol-cetylpyridinium, methocarbamol Antibiotics: Anti-infectives (From admission, onward)   Start     Dose/Rate Route Frequency Ordered Stop   11/05/18 2200  oseltamivir (TAMIFLU) capsule 30 mg     30 mg Oral 2 times daily 11/05/18 1330 11/09/18 0959   11/05/18 1000  vancomycin (VANCOCIN) IVPB 1000 mg/200 mL premix  Status:  Discontinued     1,000 mg 200 mL/hr over 60 Minutes Intravenous Every 24 hours 11/04/18 1259 11/05/18 1127   11/05/18 1000  ceFEPIme (MAXIPIME) 2 g in sodium chloride 0.9 % 100 mL IVPB  Status:  Discontinued     2 g  200 mL/hr over 30 Minutes Intravenous Every 24 hours 11/04/18 1259 11/05/18 1127   11/04/18 1500  oseltamivir (TAMIFLU) capsule 75 mg  Status:  Discontinued     75 mg Oral 2 times daily 11/04/18 1449 11/05/18 1330   11/04/18 1100  ceFEPIme (MAXIPIME) 2 g in sodium chloride 0.9 % 100 mL IVPB     2 g 200 mL/hr over 30 Minutes Intravenous  Once 11/04/18 1046 11/04/18 1153   11/04/18 1045  aztreonam (AZACTAM) 2 g in sodium chloride 0.9 % 100 mL  IVPB  Status:  Discontinued     2 g 200 mL/hr over 30 Minutes Intravenous  Once 11/04/18 1042 11/04/18 1046   11/04/18 1045  metroNIDAZOLE (FLAGYL) IVPB 500 mg  Status:  Discontinued     500 mg 100 mL/hr over 60 Minutes Intravenous Every 8 hours 11/04/18 1042 11/05/18 0800   11/04/18 1045  vancomycin (VANCOCIN) IVPB 1000 mg/200 mL premix     1,000 mg 200 mL/hr over 60 Minutes Intravenous  Once 11/04/18 1042 11/04/18 1225       Objective: Physical Exam: Vitals:   11/06/18 1024 11/06/18 1124 11/06/18 1721 11/06/18 1805  BP:    (!) 154/82  Pulse:    91  Resp: (!) 22 19 (!) 24 18  Temp:    98.7 F (37.1 C)  TempSrc:      SpO2:    98%  Weight:      Height:        Intake/Output Summary (Last 24 hours) at 11/06/2018 1837 Last data filed at 11/06/2018 1344 Gross per 24 hour  Intake 600 ml  Output 550 ml  Net 50 ml   Filed Weights   11/04/18 2217  Weight: 40.7 kg   General: Alert, Awake and Oriented to Time, Place and Person. Appear in moderate distress, affect appropriate Eyes: PERRL, Conjunctiva normal ENT: Oral Mucosa clear moist. Neck: no JVD, no Abnormal Mass Or lumps Cardiovascular: S1 and S2 Present, no Murmur, Peripheral Pulses Present Respiratory: increased  respiratory effort, Bilateral Air entry equal and Decreased, no use of accessory muscle, bilateral  Crackles, bilateral  wheezes Abdomen: Bowel Sound present, Soft and no tenderness, no hernia Skin: no redness, no Rash, no induration Extremities: no Pedal edema, no calf tenderness Neurologic: Grossly no focal neuro deficit. Bilaterally Equal motor strength  Data Reviewed: CBC: Recent Labs  Lab 11/04/18 1122 11/05/18 0221 11/06/18 0802  WBC 13.7* 5.8 15.7*  NEUTROABS 7.7  --   --   HGB 12.2 10.3* 11.3*  HCT 40.9 32.4* 36.2  MCV 96.9 97.3 96.8  PLT 310 211 185   Basic Metabolic Panel: Recent Labs  Lab 11/04/18 1122 11/05/18 0221 11/06/18 0802  NA 136 138 135  K 3.9 3.8 4.7  CL 94* 99 97*  CO2 32  31 35*  GLUCOSE 187* 201* 153*  BUN 10 12 17   CREATININE 0.84 0.84 0.58  CALCIUM 8.3* 8.1* 8.4*    Liver Function Tests: Recent Labs  Lab 11/04/18 1122  AST 28  ALT 18  ALKPHOS 58  BILITOT 0.4  PROT 6.5  ALBUMIN 3.8   Recent Labs  Lab 11/04/18 1122  LIPASE 26   No results for input(s): AMMONIA in the last 168 hours. Coagulation Profile: No results for input(s): INR, PROTIME in the last 168 hours. Cardiac Enzymes: Recent Labs  Lab 11/05/18 0221 11/05/18 0827 11/05/18 1421  TROPONINI 0.06* 0.04* <0.03   BNP (last 3 results) No results for input(s): PROBNP in the last  8760 hours. CBG: Recent Labs  Lab 11/05/18 1648 11/05/18 2229 11/06/18 0941 11/06/18 1213 11/06/18 1706  GLUCAP 101* 318* 260* 114* 164*   Studies: No results found.   Time spent: 35 minutes  Author: Berle Mull, MD [facility] Pager: [phone] 11/06/2018 6:37 PM  Between 7PM-7AM, please contact night-coverage at [email or web address], password [facility]

## 2018-11-07 LAB — BASIC METABOLIC PANEL
Anion gap: 8 (ref 5–15)
BUN: 23 mg/dL (ref 8–23)
CO2: 36 mmol/L — ABNORMAL HIGH (ref 22–32)
Calcium: 8.4 mg/dL — ABNORMAL LOW (ref 8.9–10.3)
Chloride: 91 mmol/L — ABNORMAL LOW (ref 98–111)
Creatinine, Ser: 0.76 mg/dL (ref 0.44–1.00)
GFR calc Af Amer: >60 mL/min (ref >60)
GFR calc non Af Amer: >60 mL/min (ref >60)
Glucose, Bld: 122 mg/dL — ABNORMAL HIGH (ref 70–99)
Potassium: 4.9 mmol/L (ref 3.5–5.1)
Sodium: 135 mmol/L (ref 135–145)

## 2018-11-07 LAB — CBC
HCT: 36.1 % (ref 36.0–46.0)
Hemoglobin: 10.9 g/dL — ABNORMAL LOW (ref 12.0–15.0)
MCH: 29 pg (ref 26.0–34.0)
MCHC: 30.2 g/dL (ref 30.0–36.0)
MCV: 96 fL (ref 80.0–100.0)
Platelets: 250 10*3/uL (ref 150–400)
RBC: 3.76 MIL/uL — ABNORMAL LOW (ref 3.87–5.11)
RDW: 12.9 % (ref 11.5–15.5)
WBC: 10.8 10*3/uL — ABNORMAL HIGH (ref 4.0–10.5)
nRBC: 0 % (ref 0.0–0.2)

## 2018-11-07 LAB — GLUCOSE, CAPILLARY
GLUCOSE-CAPILLARY: 101 mg/dL — AB (ref 70–99)
Glucose-Capillary: 195 mg/dL — ABNORMAL HIGH (ref 70–99)
Glucose-Capillary: 120 mg/dL — ABNORMAL HIGH (ref 70–99)
Glucose-Capillary: 122 mg/dL — ABNORMAL HIGH (ref 70–99)

## 2018-11-07 MED ORDER — METOPROLOL TARTRATE 12.5 MG HALF TABLET
12.5000 mg | ORAL_TABLET | Freq: Two times a day (BID) | ORAL | Status: DC
  Administered 2018-11-07 – 2018-11-10 (×7): 12.5 mg via ORAL
  Filled 2018-11-07 (×9): qty 1

## 2018-11-07 MED ORDER — ENSURE ENLIVE PO LIQD
237.0000 mL | Freq: Four times a day (QID) | ORAL | Status: DC
  Administered 2018-11-07 – 2018-11-10 (×9): 237 mL via ORAL

## 2018-11-07 MED ORDER — POLYETHYLENE GLYCOL 3350 17 G PO PACK
17.0000 g | PACK | Freq: Two times a day (BID) | ORAL | Status: DC
  Filled 2018-11-07 (×5): qty 1

## 2018-11-07 MED ORDER — SENNOSIDES-DOCUSATE SODIUM 8.6-50 MG PO TABS
1.0000 | ORAL_TABLET | Freq: Two times a day (BID) | ORAL | Status: DC
  Administered 2018-11-07 (×2): 1 via ORAL
  Filled 2018-11-07 (×7): qty 1

## 2018-11-07 MED ORDER — LEVALBUTEROL HCL 1.25 MG/0.5ML IN NEBU
1.2500 mg | INHALATION_SOLUTION | Freq: Two times a day (BID) | RESPIRATORY_TRACT | Status: DC
  Administered 2018-11-07 – 2018-11-10 (×6): 1.25 mg via RESPIRATORY_TRACT
  Filled 2018-11-07 (×6): qty 0.5

## 2018-11-07 MED ORDER — BUTALBITAL-APAP-CAFFEINE 50-325-40 MG PO TABS
1.0000 | ORAL_TABLET | Freq: Two times a day (BID) | ORAL | Status: DC | PRN
  Administered 2018-11-07 – 2018-11-09 (×2): 1 via ORAL
  Filled 2018-11-07 (×2): qty 1

## 2018-11-07 NOTE — Progress Notes (Signed)
Pt refuses to wear CPAP for the night.

## 2018-11-07 NOTE — Progress Notes (Signed)
Patient prefers [employer] over [employer]

## 2018-11-07 NOTE — Plan of Care (Signed)
  Problem: Activity: Goal: Ability to tolerate increased activity will improve Outcome: Progressing   Problem: Clinical Measurements: Goal: Ability to maintain a body temperature in the normal range will improve Outcome: Progressing   Problem: Respiratory: Goal: Ability to maintain adequate ventilation will improve Outcome: Progressing   Problem: Respiratory: Goal: Ability to maintain a clear airway will improve Outcome: Progressing

## 2018-11-07 NOTE — Progress Notes (Signed)
[facility] Progress Note  Patient: Megan [name] OZH:[id number]   PCP: [name], MD DOB: 1951/10/24   DOA: 11/04/2018   DOS: 11/07/2018   Date of Service: the patient was seen and examined on 11/07/2018  Brief hospital course: Pt. with PMH of COPD, chronic diastolic CHF, COPD, depression, HTN, CAD; admitted on 11/04/2018, presented with complaint of shortness of breath, was found to have COPD exacerbation secondary to influenza A. Currently further plan is continue current care.  Subjective: Reports headache, continues to have generalized body ache as well as neck pain.  No nausea no vomiting.  Shortness of breath and cough still present.  Concern about going home tomorrow.  Telemetry: Sinus tachycardia  Assessment and Plan: 1.  COPD exacerbation. Acute on chronic hypoxic respiratory failure. Influenza PCR. Needed BiPAP overnight, currently on all oxygen. Influenza a positive. Continue Tamiflu. Was also started on IV antibiotics will currently hold it and monitor. Continue with IV steroids, continue nebulization. Switch to oral prednisone, add Robaxin  2.  Dysphagia. Prior esophagram shows large widemouth diverticulum. Prior EGD showed nonbleeding diverticulum with food still in the distal esophagus. Continue soft diet. Follow-up with GI as recommended in the past admission.  3.  Essential HTN. Blood pressure now better. We will resume beta-blocker.  4.  Depression. Continue Zoloft.  5.  Steroid-induced hyperglycemia. On sliding scale insulin.  6. Anemia unidentified etiology. Baseline hemoglobin around 10-11. MCV normal. Continue to monitor for now. No active bleeding reported.  7.  Underweight.  Body mass index is 16.41 kg/m.  Continue to monitor. Dietary consultation. Continue Ensure.  Diet: soft diet DVT Prophylaxis: subcutaneous Heparin  Advance goals of care discussion: full code  Family Communication: no family was present at bedside, at the time of  interview.   Disposition:  Discharge to home tomorrow.  Consultants: none Procedures: none  Scheduled Meds: . ALPRAZolam  0.25 mg Oral BID  . aspirin EC  81 mg Oral Daily  . benzonatate  100 mg Oral TID  . budesonide  0.5 mg Nebulization BID  . Chlorhexidine Gluconate Cloth  6 each Topical Q0600  . dextromethorphan  30 mg Oral BID  . enoxaparin (LOVENOX) injection  40 mg Subcutaneous Q24H  . feeding supplement  1 Container Oral TID BM  . feeding supplement (ENSURE ENLIVE)  237 mL Oral QID  . fluticasone  2 spray Each Nare Daily  . guaiFENesin  600 mg Oral BID  . insulin aspart  0-5 Units Subcutaneous QHS  . insulin aspart  0-9 Units Subcutaneous TID WC  . levalbuterol  1.25 mg Nebulization BID  . loratadine  10 mg Oral Daily  . mouth rinse  15 mL Mouth Rinse BID  . metoprolol tartrate  12.5 mg Oral BID  . montelukast  10 mg Oral QHS  . mupirocin ointment  1 application Nasal BID  . oseltamivir  30 mg Oral BID  . pantoprazole  40 mg Oral Daily  . polyethylene glycol  17 g Oral BID  . predniSONE  50 mg Oral Q breakfast  . senna-docusate  1 tablet Oral BID  . sertraline  50 mg Oral Daily   Continuous Infusions: PRN Meds: butalbital-acetaminophen-caffeine, HYDROcodone-acetaminophen, menthol-cetylpyridinium, methocarbamol Antibiotics: Anti-infectives (From admission, onward)   Start     Dose/Rate Route Frequency Ordered Stop   11/05/18 2200  oseltamivir (TAMIFLU) capsule 30 mg     30 mg Oral 2 times daily 11/05/18 1330 11/09/18 0959   11/05/18 1000  vancomycin (VANCOCIN) IVPB 1000 mg/200  mL premix  Status:  Discontinued     1,000 mg 200 mL/hr over 60 Minutes Intravenous Every 24 hours 11/04/18 1259 11/05/18 1127   11/05/18 1000  ceFEPIme (MAXIPIME) 2 g in sodium chloride 0.9 % 100 mL IVPB  Status:  Discontinued     2 g 200 mL/hr over 30 Minutes Intravenous Every 24 hours 11/04/18 1259 11/05/18 1127   11/04/18 1500  oseltamivir (TAMIFLU) capsule 75 mg  Status:  Discontinued      75 mg Oral 2 times daily 11/04/18 1449 11/05/18 1330   11/04/18 1100  ceFEPIme (MAXIPIME) 2 g in sodium chloride 0.9 % 100 mL IVPB     2 g 200 mL/hr over 30 Minutes Intravenous  Once 11/04/18 1046 11/04/18 1153   11/04/18 1045  aztreonam (AZACTAM) 2 g in sodium chloride 0.9 % 100 mL IVPB  Status:  Discontinued     2 g 200 mL/hr over 30 Minutes Intravenous  Once 11/04/18 1042 11/04/18 1046   11/04/18 1045  metroNIDAZOLE (FLAGYL) IVPB 500 mg  Status:  Discontinued     500 mg 100 mL/hr over 60 Minutes Intravenous Every 8 hours 11/04/18 1042 11/05/18 0800   11/04/18 1045  vancomycin (VANCOCIN) IVPB 1000 mg/200 mL premix     1,000 mg 200 mL/hr over 60 Minutes Intravenous  Once 11/04/18 1042 11/04/18 1225       Objective: Physical Exam: Vitals:   11/06/18 2043 11/06/18 2345 11/07/18 0808 11/07/18 1713  BP:  139/68 (!) 159/96 (!) 177/98  Pulse:  (!) 101 (!) 108 (!) 102  Resp:  20 18 18   Temp:  98.5 F (36.9 C) 97.8 F (36.6 C) 98.2 F (36.8 C)  TempSrc:  Oral  Oral  SpO2: 97% 98% 96% 100%  Weight:      Height:        Intake/Output Summary (Last 24 hours) at 11/07/2018 1717 Last data filed at 11/07/2018 1712 Gross per 24 hour  Intake 600 ml  Output 2600 ml  Net -2000 ml   Filed Weights   11/04/18 2217  Weight: 40.7 kg   General: Alert, Awake and Oriented to Time, Place and Person. Appear in moderate distress, affect appropriate Eyes: PERRL, Conjunctiva normal ENT: Oral Mucosa clear moist. Neck: no JVD, no Abnormal Mass Or lumps Cardiovascular: S1 and S2 Present, no Murmur, Peripheral Pulses Present Respiratory: increased  respiratory effort, Bilateral Air entry equal and Decreased, no use of accessory muscle, bilateral  Crackles, bilateral  wheezes Abdomen: Bowel Sound present, Soft and no tenderness, no hernia Skin: no redness, no Rash, no induration Extremities: no Pedal edema, no calf tenderness Neurologic: Grossly no focal neuro deficit. Bilaterally Equal motor  strength  Data Reviewed: CBC: Recent Labs  Lab 11/04/18 1122 11/05/18 0221 11/06/18 0802 11/07/18 0814  WBC 13.7* 5.8 15.7* 10.8*  NEUTROABS 7.7  --   --   --   HGB 12.2 10.3* 11.3* 10.9*  HCT 40.9 32.4* 36.2 36.1  MCV 96.9 97.3 96.8 96.0  PLT 310 211 263 536   Basic Metabolic Panel: Recent Labs  Lab 11/04/18 1122 11/05/18 0221 11/06/18 0802 11/07/18 0814  NA 136 138 135 135  K 3.9 3.8 4.7 4.9  CL 94* 99 97* 91*  CO2 32 31 35* 36*  GLUCOSE 187* 201* 153* 122*  BUN 10 12 17 23   CREATININE 0.84 0.84 0.58 0.76  CALCIUM 8.3* 8.1* 8.4* 8.4*    Liver Function Tests: Recent Labs  Lab 11/04/18 1122  AST 28  ALT  18  ALKPHOS 58  BILITOT 0.4  PROT 6.5  ALBUMIN 3.8   Recent Labs  Lab 11/04/18 1122  LIPASE 26   No results for input(s): AMMONIA in the last 168 hours. Coagulation Profile: No results for input(s): INR, PROTIME in the last 168 hours. Cardiac Enzymes: Recent Labs  Lab 11/05/18 0221 11/05/18 0827 11/05/18 1421  TROPONINI 0.06* 0.04* <0.03   BNP (last 3 results) No results for input(s): PROBNP in the last 8760 hours. CBG: Recent Labs  Lab 11/06/18 1706 11/06/18 2103 11/07/18 0719 11/07/18 1216 11/07/18 1629  GLUCAP 164* 208* 101* 195* 120*   Studies: No results found.   Time spent: 35 minutes  Author: Berle Mull, MD [facility] Pager: [phone] 11/07/2018 5:17 PM  Between 7PM-7AM, please contact night-coverage at [email or web address], password [facility]

## 2018-11-08 DIAGNOSIS — J9602 Acute respiratory failure with hypercapnia: Secondary | ICD-10-CM

## 2018-11-08 DIAGNOSIS — J9601 Acute respiratory failure with hypoxia: Secondary | ICD-10-CM

## 2018-11-08 LAB — GLUCOSE, CAPILLARY
Glucose-Capillary: 87 mg/dL (ref 70–99)
Glucose-Capillary: 138 mg/dL — ABNORMAL HIGH (ref 70–99)
Glucose-Capillary: 143 mg/dL — ABNORMAL HIGH (ref 70–99)
Glucose-Capillary: 117 mg/dL — ABNORMAL HIGH (ref 70–99)

## 2018-11-08 LAB — CBC WITH DIFFERENTIAL/PLATELET
Abs Immature Granulocytes: 0.01 10*3/uL (ref 0.00–0.07)
Basophils Absolute: 0 10*3/uL (ref 0.0–0.1)
Basophils Relative: 0 %
EOS PCT: 0 %
Eosinophils Absolute: 0 10*3/uL (ref 0.0–0.5)
HEMATOCRIT: 36.4 % (ref 36.0–46.0)
HEMOGLOBIN: 11 g/dL — AB (ref 12.0–15.0)
Immature Granulocytes: 0 %
Lymphocytes Relative: 18 %
Lymphs Abs: 1.4 10*3/uL (ref 0.7–4.0)
MCH: 29 pg (ref 26.0–34.0)
MCHC: 30.2 g/dL (ref 30.0–36.0)
MCV: 96 fL (ref 80.0–100.0)
Monocytes Absolute: 1 10*3/uL (ref 0.1–1.0)
Monocytes Relative: 12 %
Neutro Abs: 5.5 10*3/uL (ref 1.7–7.7)
Neutrophils Relative %: 70 %
Platelets: 251 10*3/uL (ref 150–400)
RBC: 3.79 MIL/uL — ABNORMAL LOW (ref 3.87–5.11)
RDW: 12.8 % (ref 11.5–15.5)
WBC: 7.9 10*3/uL (ref 4.0–10.5)
nRBC: 0 % (ref 0.0–0.2)

## 2018-11-08 LAB — COMPREHENSIVE METABOLIC PANEL
ALT: 11 U/L (ref 0–44)
AST: 16 U/L (ref 15–41)
Albumin: 2.6 g/dL — ABNORMAL LOW (ref 3.5–5.0)
Alkaline Phosphatase: 42 U/L (ref 38–126)
Anion gap: 4 — ABNORMAL LOW (ref 5–15)
BUN: 19 mg/dL (ref 8–23)
CO2: 41 mmol/L — ABNORMAL HIGH (ref 22–32)
CREATININE: 0.6 mg/dL (ref 0.44–1.00)
Calcium: 8.4 mg/dL — ABNORMAL LOW (ref 8.9–10.3)
Chloride: 91 mmol/L — ABNORMAL LOW (ref 98–111)
GFR calc Af Amer: >60 mL/min (ref >60)
GFR calc non Af Amer: >60 mL/min (ref >60)
Glucose, Bld: 97 mg/dL (ref 70–99)
Potassium: 4.7 mmol/L (ref 3.5–5.1)
SODIUM: 136 mmol/L (ref 135–145)
Total Bilirubin: 0.6 mg/dL (ref 0.3–1.2)
Total Protein: 5.2 g/dL — ABNORMAL LOW (ref 6.5–8.1)

## 2018-11-08 LAB — MAGNESIUM: Magnesium: 2 mg/dL (ref 1.7–2.4)

## 2018-11-08 MED ORDER — DILTIAZEM HCL ER COATED BEADS 240 MG PO CP24
240.0000 mg | ORAL_CAPSULE | Freq: Every day | ORAL | Status: DC
  Administered 2018-11-08 – 2018-11-10 (×3): 240 mg via ORAL
  Filled 2018-11-08 (×3): qty 1

## 2018-11-08 MED ORDER — LIDOCAINE 5 % EX PTCH
1.0000 | MEDICATED_PATCH | CUTANEOUS | Status: DC
  Administered 2018-11-08 – 2018-11-10 (×3): 1 via TRANSDERMAL
  Filled 2018-11-08 (×3): qty 1

## 2018-11-08 MED ORDER — LEVALBUTEROL HCL 1.25 MG/0.5ML IN NEBU
INHALATION_SOLUTION | RESPIRATORY_TRACT | Status: AC
  Administered 2018-11-08: 1.25 mg via RESPIRATORY_TRACT
  Filled 2018-11-08: qty 0.5

## 2018-11-08 NOTE — Progress Notes (Signed)
[facility] Progress Note  Patient: Megan [name] HGD:[id number]   PCP: [name], MD DOB: 1951/07/21   DOA: 11/04/2018   DOS: 11/08/2018   Date of Service: the patient was seen and examined on 11/08/2018  Brief hospital course: Pt. with PMH of COPD, chronic diastolic CHF, COPD, depression, HTN, CAD; admitted on 11/04/2018, presented with complaint of shortness of breath, was found to have COPD exacerbation secondary to influenza A. -Improving slowly, anxiety contributing  Subjective: Extremely anxious this morning worried about going home, reports poor sleep last night and urinating more frequently  Assessment and Plan: 1.  Chronic hypoxic respiratory failure/COPD exacerbation  -Secondary to influenza A -improving, briefly required BiPAP on admission, now off, transition to oral steroids -Continue Tamiflu, complete 5-day course -Has severe anxiety also, continue home regimen of Xanax  2.  Dysphagia. Prior esophagram shows large widemouth diverticulum. Prior EGD showed nonbleeding diverticulum with food still in the distal esophagus. Continue soft diet. Follow-up with GI as recommended in the past admission.  3.  Essential HTN. -Started home regimen of low-dose beta-blocker and Cardizem  4.  Depression. Continue Zoloft.  5.  Steroid-induced hyperglycemia. On sliding scale insulin.  6.  Mild normocytic anemia -Stable  7.  Severe protein calorie malnutrition  Body mass index is 16.41 kg/m.  -Continue Ensure.  Diet: soft diet DVT Prophylaxis: subcutaneous Heparin  Advance goals of care discussion: full code  Family Communication: no family was present at bedside, at the time of interview.   Disposition: Discharge to home tomorrow.  Consultants: none Procedures: none  Scheduled Meds: . ALPRAZolam  0.25 mg Oral BID  . aspirin EC  81 mg Oral Daily  . benzonatate  100 mg Oral TID  . budesonide  0.5 mg Nebulization BID  . Chlorhexidine Gluconate Cloth  6 each  Topical Q0600  . dextromethorphan  30 mg Oral BID  . diltiazem  240 mg Oral Daily  . enoxaparin (LOVENOX) injection  40 mg Subcutaneous Q24H  . feeding supplement  1 Container Oral TID BM  . feeding supplement (ENSURE ENLIVE)  237 mL Oral QID  . fluticasone  2 spray Each Nare Daily  . guaiFENesin  600 mg Oral BID  . insulin aspart  0-5 Units Subcutaneous QHS  . insulin aspart  0-9 Units Subcutaneous TID WC  . levalbuterol  1.25 mg Nebulization BID  . lidocaine  1 patch Transdermal Q24H  . loratadine  10 mg Oral Daily  . mouth rinse  15 mL Mouth Rinse BID  . metoprolol tartrate  12.5 mg Oral BID  . montelukast  10 mg Oral QHS  . mupirocin ointment  1 application Nasal BID  . oseltamivir  30 mg Oral BID  . pantoprazole  40 mg Oral Daily  . polyethylene glycol  17 g Oral BID  . predniSONE  50 mg Oral Q breakfast  . senna-docusate  1 tablet Oral BID  . sertraline  50 mg Oral Daily   Continuous Infusions: PRN Meds: butalbital-acetaminophen-caffeine, HYDROcodone-acetaminophen, menthol-cetylpyridinium, methocarbamol Antibiotics: Anti-infectives (From admission, onward)   Start     Dose/Rate Route Frequency Ordered Stop   11/05/18 2200  oseltamivir (TAMIFLU) capsule 30 mg     30 mg Oral 2 times daily 11/05/18 1330 11/09/18 0959   11/05/18 1000  vancomycin (VANCOCIN) IVPB 1000 mg/200 mL premix  Status:  Discontinued     1,000 mg 200 mL/hr over 60 Minutes Intravenous Every 24 hours 11/04/18 1259 11/05/18 1127   11/05/18 1000  ceFEPIme (MAXIPIME)  2 g in sodium chloride 0.9 % 100 mL IVPB  Status:  Discontinued     2 g 200 mL/hr over 30 Minutes Intravenous Every 24 hours 11/04/18 1259 11/05/18 1127   11/04/18 1500  oseltamivir (TAMIFLU) capsule 75 mg  Status:  Discontinued     75 mg Oral 2 times daily 11/04/18 1449 11/05/18 1330   11/04/18 1100  ceFEPIme (MAXIPIME) 2 g in sodium chloride 0.9 % 100 mL IVPB     2 g 200 mL/hr over 30 Minutes Intravenous  Once 11/04/18 1046 11/04/18 1153    11/04/18 1045  aztreonam (AZACTAM) 2 g in sodium chloride 0.9 % 100 mL IVPB  Status:  Discontinued     2 g 200 mL/hr over 30 Minutes Intravenous  Once 11/04/18 1042 11/04/18 1046   11/04/18 1045  metroNIDAZOLE (FLAGYL) IVPB 500 mg  Status:  Discontinued     500 mg 100 mL/hr over 60 Minutes Intravenous Every 8 hours 11/04/18 1042 11/05/18 0800   11/04/18 1045  vancomycin (VANCOCIN) IVPB 1000 mg/200 mL premix     1,000 mg 200 mL/hr over 60 Minutes Intravenous  Once 11/04/18 1042 11/04/18 1225       Objective: Physical Exam: Vitals:   11/07/18 2006 11/07/18 2351 11/08/18 0747 11/08/18 1218  BP:  (!) 162/84 (!) 173/91   Pulse:  76 (!) 106   Resp:  (!) 21 16   Temp:      TempSrc:      SpO2: 98% 99% 100% 100%  Weight:      Height:        Intake/Output Summary (Last 24 hours) at 11/08/2018 1428 Last data filed at 11/08/2018 0600 Gross per 24 hour  Intake 480 ml  Output 3300 ml  Net -2820 ml   Filed Weights   11/04/18 2217  Weight: 40.7 kg  Gen: Awake, Alert, Oriented X 3, frail cachectic female, appears much older than stated age 45: PERRLA, Neck supple, no JVD Lungs: Poor air movement, no wheezes or rhonchi CVS: RRR,No Gallops,Rubs or new Murmurs Abd: soft, Non tender, non distended, BS present Extremities: No edema Skin: no new rashes  Data Reviewed: CBC: Recent Labs  Lab 11/04/18 1122 11/05/18 0221 11/06/18 0802 11/07/18 0814 11/08/18 0240  WBC 13.7* 5.8 15.7* 10.8* 7.9  NEUTROABS 7.7  --   --   --  5.5  HGB 12.2 10.3* 11.3* 10.9* 11.0*  HCT 40.9 32.4* 36.2 36.1 36.4  MCV 96.9 97.3 96.8 96.0 96.0  PLT 310 211 263 250 751   Basic Metabolic Panel: Recent Labs  Lab 11/04/18 1122 11/05/18 0221 11/06/18 0802 11/07/18 0814 11/08/18 0240  NA 136 138 135 135 136  K 3.9 3.8 4.7 4.9 4.7  CL 94* 99 97* 91* 91*  CO2 32 31 35* 36* 41*  GLUCOSE 187* 201* 153* 122* 97  BUN 10 12 17 23 19   CREATININE 0.84 0.84 0.58 0.76 0.60  CALCIUM 8.3* 8.1* 8.4* 8.4* 8.4*  MG   --   --   --   --  2.0    Liver Function Tests: Recent Labs  Lab 11/04/18 1122 11/08/18 0240  AST 28 16  ALT 18 11  ALKPHOS 58 42  BILITOT 0.4 0.6  PROT 6.5 5.2*  ALBUMIN 3.8 2.6*   Recent Labs  Lab 11/04/18 1122  LIPASE 26   No results for input(s): AMMONIA in the last 168 hours. Coagulation Profile: No results for input(s): INR, PROTIME in the last 168 hours. Cardiac Enzymes:  Recent Labs  Lab 11/05/18 0221 11/05/18 0827 11/05/18 1421  TROPONINI 0.06* 0.04* <0.03   BNP (last 3 results) No results for input(s): PROBNP in the last 8760 hours. CBG: Recent Labs  Lab 11/07/18 1216 11/07/18 1629 11/07/18 2124 11/08/18 0745 11/08/18 1137  GLUCAP 195* 120* 122* 87 138*   Studies: No results found.   Time spent: 25 minutes  Author: Domenic Polite, MD 11/08/2018 2:28 PM

## 2018-11-08 NOTE — Plan of Care (Signed)
  Problem: Activity: Goal: Ability to tolerate increased activity will improve Outcome: Progressing   Problem: Clinical Measurements: Goal: Ability to maintain a body temperature in the normal range will improve Outcome: Progressing   Problem: Respiratory: Goal: Ability to maintain adequate ventilation will improve Outcome: Progressing   Problem: Respiratory: Goal: Ability to maintain a clear airway will improve Outcome: Progressing

## 2018-11-08 NOTE — Evaluation (Signed)
Physical Therapy Evaluation Patient Details Name: Megan [name] MRN: [id number] DOB: 10/27/1951 Today's Date: 11/08/2018   History of Present Illness  Pt. with PMH of COPD, chronic diastolic CHF, COPD, depression, HTN, CAD; admitted on 11/04/2018, presented with complaint of shortness of breath, was found to have COPD exacerbation secondary to influenza A    Clinical Impression  Pt admitted with above diagnosis. Pt currently with functional limitations due to the deficits listed below (see PT Problem List). PTA, pt living with daughter, independent with moblity wearing 2L home O2. Today patient limited to bed level eval/ introduction of therex due to urinary incontinence. Pt anxious and not agreeable to OOB mobility, discussed benefits of activity and stimulation during day to sleep better as she also c/o of not sleeping well. RN aware. Will progress ambulation next visit and update recs if needed. Pt will benefit from skilled PT to increase their independence and safety with mobility to allow discharge to the venue listed below.       Follow Up Recommendations Home health PT(pending progress)    Equipment Recommendations  (TBD)    Recommendations for Other Services       Precautions / Restrictions Precautions Precautions: Fall Restrictions Weight Bearing Restrictions: No      Mobility  Bed Mobility               General bed mobility comments: pt declining mobility due to urinary incontinence   Transfers                    Ambulation/Gait                Stairs            Wheelchair Mobility    Modified Rankin (Stroke Patients Only)       Balance Overall balance assessment: (N/T)                                           Pertinent Vitals/Pain Pain Assessment: Faces Faces Pain Scale: Hurts a little bit Pain Location: chest, coughing Pain Descriptors / Indicators: Aching Pain Intervention(s): Limited activity within  patient's tolerance    Home Living Family/patient expects to be discharged to:: Private residence Living Arrangements: Children Available Help at Discharge: Family;Available PRN/intermittently Type of Home: House Home Access: Stairs to enter Entrance Stairs-Rails: None Entrance Stairs-Number of Steps: 2 Home Layout: One level Home Equipment: Cane - single point;Walker - 4 wheels      Prior Function Level of Independence: Independent         Comments: no longer drives, I with mobility 2L home O2     Hand Dominance   Dominant Hand: Right    Extremity/Trunk Assessment   Upper Extremity Assessment Upper Extremity Assessment: Overall WFL for tasks assessed    Lower Extremity Assessment Lower Extremity Assessment: Overall WFL for tasks assessed       Communication   Communication: No difficulties  Cognition Arousal/Alertness: Awake/alert Behavior During Therapy: WFL for tasks assessed/performed Overall Cognitive Status: Within Functional Limits for tasks assessed                                        General Comments      Exercises General Exercises - Lower Extremity Ankle Circles/Pumps: 20 reps Quad  Sets: 20 reps Heel Slides: 20 reps Hip ABduction/ADduction: 20 reps Straight Leg Raises: 20 reps   Assessment/Plan    PT Assessment Patient needs continued PT services  PT Problem List Decreased activity tolerance       PT Treatment Interventions DME instruction;Gait training;Stair training    PT Goals (Current goals can be found in the Care Plan section)  Acute Rehab PT Goals Patient Stated Goal: to stop having to urinate and get some rest PT Goal Formulation: With patient Potential to Achieve Goals: Good    Frequency Min 3X/week   Barriers to discharge        Co-evaluation               AM-PAC PT "6 Clicks" Mobility  Outcome Measure Help needed turning from your back to your side while in a flat bed without using  bedrails?: None Help needed moving from lying on your back to sitting on the side of a flat bed without using bedrails?: None Help needed moving to and from a bed to a chair (including a wheelchair)?: A Little Help needed standing up from a chair using your arms (e.g., wheelchair or bedside chair)?: A Little Help needed to walk in hospital room?: A Little Help needed climbing 3-5 steps with a railing? : A Lot 6 Click Score: 19    End of Session Equipment Utilized During Treatment: Gait belt Activity Tolerance: Patient tolerated treatment well Patient left: in bed Nurse Communication: Mobility status PT Visit Diagnosis: Difficulty in walking, not elsewhere classified (R26.2)    Time: 5945-8592 PT Time Calculation (min) (ACUTE ONLY): 26 min   Charges:   PT Evaluation $PT Eval Low Complexity: 1 Low PT Treatments $Therapeutic Exercise: 8-22 mins        Reinaldo Berber, PT, DPT Acute Rehabilitation Services Pager: [phone] Office: [place] 11/08/2018, 2:09 PM

## 2018-11-08 NOTE — Consult Note (Addendum)
            [facility] CM Primary Care Navigator  11/08/2018  Megan [name] 05-19-1951 [id number]   Went to seepatient at the bedside to identify possible discharge needs. Patient reportspresenting with worsening weakness, shortness of breath, headache, pain all over, chest discomfort with fever/ chills which all led to this admission. She was found to have COPD exacerbation secondary to influenza A.  Patient endorsesDr.James John with [employer] [place] as the primary care provider.   [facility] [address] [facility].   Patientstatesmanagingher ownmedications at home straight out of the containers.  Patient reportsthatshe uses "yellow cab" or her daughter Megan [name]) provides transportation to her doctors'appointments.  She verbalizedliving with daughter ("taking care of children") but is mainly independent with care prior to admission. She mentioned that her daughter, friends/ caregivers of her grandchildren are providing assistance to her, whenever it is needed.    Anticipated discharge planis home according to patient.   Patientexpressedunderstanding to call primary care provider's office for a post discharge follow-up appointment within1- 2 weeksor sooner if needs arise.Patient letter (with PCP's contact number) was provided as a reminder.  Explained to patient about [facility] CM services available for health management and resources at home and she seemed interested with it. COPD action plan was discussed with patient using teach back method. Patient mentioned plan of obtaining weighing scale after discharge to monitor and record daily weight. Although patient had indicated interest forservices, she had only opted and verbally agreed with phone calls ([facility] COPD calls) to follow-up with her recovery at home.  [facility] was made for [facility] COPDcall[facility] after discharge.  Patienthad expressed understanding to  seekreferral to [facility] care management from primary care provider if deemed necessary for further servicesin the future.   [facility] care management information provided for future needs that may arise.  Primary care provider's office is listed as providing transition of care (TOC) follow-up.    For additional questions please contact:  Edwena Felty A. Rumi Kolodziej, BSN, RN-BC [facility] PRIMARY CARE Navigator Cell: [phone]

## 2018-11-09 LAB — CULTURE, BLOOD (ROUTINE X 2)
CULTURE: NO GROWTH
CULTURE: NO GROWTH

## 2018-11-09 LAB — GLUCOSE, CAPILLARY
Glucose-Capillary: 79 mg/dL (ref 70–99)
Glucose-Capillary: 132 mg/dL — ABNORMAL HIGH (ref 70–99)
Glucose-Capillary: 327 mg/dL — ABNORMAL HIGH (ref 70–99)
Glucose-Capillary: 85 mg/dL (ref 70–99)

## 2018-11-09 MED ORDER — PREDNISONE 10 MG PO TABS: ORAL_TABLET | ORAL | 0 refills | Status: DC

## 2018-11-09 NOTE — Discharge Summary (Signed)
Physician Discharge Summary  Megan [name] GUR:[id number] DOB: 1951/08/25 DOA: 11/04/2018  PCP: [name], MD  Admit date: 11/04/2018 Discharge date: 11/09/2018  Time spent: 35 minutes  Recommendations for Outpatient Follow-up:  1. PCP in 1 week 2. Dr. Lamonte Sakai in 1 month 3.  gastroenterology Dr. Silverio Decamp in 1 month   Discharge Diagnoses:  Principal Problem:   Acute on Chronic respiratory failure with hypoxia and Hypercapnia (HCC)   Influenza A   Multiple pulmonary nodules determined by computed tomography of lung   Essential hypertension   Protein-calorie malnutrition, severe   Anxiety   COPD (chronic obstructive pulmonary disease) (HCC)   Chronic respiratory failure with hypoxia and hypercapnia (HCC)   Dysphagia   Respiratory failure ([place])   Discharge Condition: improving  Diet recommendation: Heart healthy  Filed Weights   11/04/18 2217  Weight: 40.7 kg    History of present illness:  68 year old female with COPD, chronic respiratory failure on 2 L home O2, chronic diastolic CHF, CAD, depression, hypertension presented to the ED with cough congestion shortness of breath and wheezing  Hospital Course:  1.    Acute on chronic hypoxic respiratory failure  -Secondary to influenza A/COPD exacerbation -improving, briefly required BiPAP on admission, now off, transition to oral steroids -Completed 5-day course of Tamiflu, discharged home on prednisone taper  -Has severe anxiety also, continued home regimen of Xanax  2.  Dysphagia. Prior esophagram shows large widemouth diverticulum. Prior EGD showed nonbleeding diverticulum with food still in the distal esophagus. Continue soft diet. Follow-up with GI as recommended in the past admission.  3.  Essential HTN. -Started home regimen of low-dose beta-blocker and Cardizem  4.  Depression. Continue Zoloft.  5.  Steroid-induced hyperglycemia. -Sliding scale insulin used inpatient  6.  Mild normocytic  anemia -Stable  7.  Severe protein calorie malnutrition  Body mass index is 16.41 kg/m.  -Continue Ensure  Discharge Exam: Vitals:   11/08/18 2348 11/09/18 0819  BP: 133/79 115/63  Pulse: 74 74  Resp: 19 (!) 21  Temp: 98 F (36.7 C) (!) 97.4 F (36.3 C)  SpO2: 97% 98%    General: Frail, alert awake oriented x3 Cardiovascular: S1-S2/regular rate rhythm Respiratory: Improved air movement, no wheezing or rhonchi  Discharge Instructions   Discharge Instructions    Diet - low sodium heart healthy   Complete by:  As directed    Diet - low sodium heart healthy   Complete by:  As directed    Increase activity slowly   Complete by:  As directed    Increase activity slowly   Complete by:  As directed      Allergies as of 11/09/2018      Reactions   Penicillins Rash, Hives   Has patient had a PCN reaction causing immediate rash, facial/tongue/throat swelling, SOB or lightheadedness with hypotension: No Has patient had a PCN reaction causing severe rash involving mucus membranes or skin necrosis:NO Has patient had a PCN reaction that required hospitalization No Has patient had a PCN reaction occurring within the last 10 years:NO If all of the above answers are "NO", then may proceed with Cephalosporin use.      Medication List    STOP taking these medications   tiZANidine 4 MG tablet Commonly known as:  ZANAFLEX     TAKE these medications   ALPRAZolam 0.25 MG tablet Commonly known as:  XANAX TAKE 1 TABLET(0.25 MG) BY MOUTH TWICE DAILY AS NEEDED FOR ANXIETY What changed:    how  much to take  how to take this  when to take this  reasons to take this   aspirin EC 81 MG tablet Take 81 mg by mouth daily.   budesonide 0.5 MG/2ML nebulizer solution Commonly known as:  PULMICORT Take 2 mLs (0.5 mg total) by nebulization 2 (two) times daily.   butalbital-acetaminophen-caffeine 50-325-40 MG tablet Commonly known as:  FIORICET, ESGIC Take 1 tablet by mouth 2  (two) times daily as needed for headache.   diltiazem 240 MG 24 hr capsule Commonly known as:  CARDIZEM CD Take 1 capsule (240 mg total) by mouth daily.   feeding supplement (ENSURE ENLIVE) Liqd Take 237 mLs by mouth 4 (four) times daily.   fluticasone 50 MCG/ACT nasal spray Commonly known as:  FLONASE Place 2 sprays into both nostrils daily.   HYDROcodone-acetaminophen 7.5-325 MG tablet Commonly known as:  NORCO Take 1 tablet by mouth every 6 (six) hours as needed for moderate pain.   levalbuterol 1.25 MG/0.5ML nebulizer solution Commonly known as:  XOPENEX Take 1.25 mg by nebulization every 6 (six) hours.   lidocaine 5 % Commonly known as:  LIDODERM Place 1 patch onto the skin daily. Remove & Discard patch within 12 hours or as directed by MD What changed:  additional instructions   loratadine 10 MG tablet Commonly known as:  CLARITIN Take 1 tablet (10 mg total) daily by mouth.   metoprolol tartrate 25 MG tablet Commonly known as:  LOPRESSOR Take 0.5 tablets (12.5 mg total) by mouth 2 (two) times daily.   montelukast 10 MG tablet Commonly known as:  SINGULAIR Take 1 tablet (10 mg total) by mouth at bedtime.   pantoprazole 40 MG tablet Commonly known as:  PROTONIX Take 1 tablet (40 mg total) by mouth daily.   polyethylene glycol packet Commonly known as:  MIRALAX / GLYCOLAX Take 17 g by mouth 2 (two) times daily. What changed:    when to take this  reasons to take this   predniSONE 10 MG tablet Commonly known as:  DELTASONE Take 4 tabs for 2 days, then 2 tabs for 2 days, then stop. What changed:  additional instructions   senna-docusate 8.6-50 MG tablet Commonly known as:  Senokot-S Take 1 tablet by mouth 2 (two) times daily. What changed:    when to take this  reasons to take this   sertraline 50 MG tablet Commonly known as:  ZOLOFT Take 1 tablet (50 mg total) by mouth daily. What changed:    when to take this  reasons to take this    TAB-A-VITE/BETA CAROTENE Tabs Take 1 tablet by mouth daily.   TRELEGY ELLIPTA 100-62.5-25 MCG/INH Aepb Generic drug:  Fluticasone-Umeclidin-Vilant INHALE 1 PUFF INTO THE LUNGS DAILY What changed:  See the new instructions.   VENTOLIN HFA 108 (90 Base) MCG/ACT inhaler Generic drug:  albuterol INHALE 2 PUFFS INTO THE LUNGS EVERY 6 HOURS AS NEEDED FOR WHEEZING OR SHORTNESS OF BREATH What changed:  See the new instructions.      Allergies  Allergen Reactions  . Penicillins Rash and Hives    Has patient had a PCN reaction causing immediate rash, facial/tongue/throat swelling, SOB or lightheadedness with hypotension: No Has patient had a PCN reaction causing severe rash involving mucus membranes or skin necrosis:NO Has patient had a PCN reaction that required hospitalization No Has patient had a PCN reaction occurring within the last 10 years:NO If all of the above answers are "NO", then may proceed with Cephalosporin use.   Follow-up  Information    [name], MD. Schedule an appointment as soon as possible for a visit in 1 week(s).   Specialties:  Internal Medicine, Radiology Contact information: [place] [address] [phone]        Collene Gobble, MD. Schedule an appointment as soon as possible for a visit in 2 week(s).   Specialty:  Pulmonary Disease Contact information: [address] [place] [address] [place] Follow up.   Specialty:  Rehabilitation Why:  they will contact you , if you do not hear from them in a couple of days , please contact them .  Contact information: [address] [place] [phone] [place] [phone]           The results of significant diagnostics from this hospitalization (including imaging, microbiology, ancillary and laboratory) are listed below for reference.    Significant Diagnostic  Studies: Dg Chest Port 1 View  Result Date: 11/04/2018 CLINICAL DATA:  Sepsis and hypoxia.  Cough and wheezing. EXAM: PORTABLE CHEST 1 VIEW COMPARISON:  05/03/2018 FINDINGS: Tapering of the peripheral pulmonary vasculature favors emphysema. Scattered calcified granulomas in the lungs. Heart size within normal limits. Atherosclerotic calcification of the aortic arch. Mild bony demineralization. IMPRESSION: 1. Emphysema with large lung volumes. 2. Old granulomatous disease. 3. Bony demineralization. Electronically Signed   By: Van Clines M.D.   On: 11/04/2018 11:10    Microbiology: Recent Results (from the past 240 hour(s))  Urine culture     Status: None   Collection Time: 11/04/18 10:41 AM  Result Value Ref Range Status   Specimen Description URINE, RANDOM  Final   Special Requests NONE  Final   Culture   Final    NO GROWTH Performed at [facility], [address], [place], [place] [address]    Report Status 11/05/2018 FINAL  Final  Blood Culture (routine x 2)     Status: None   Collection Time: 11/04/18 11:22 AM  Result Value Ref Range Status   Specimen Description BLOOD BLOOD RIGHT FOREARM  Final   Special Requests   Final    AEROBIC BOTTLE ONLY Blood Culture results may not be optimal due to an inadequate volume of blood received in culture bottles   Culture   Final    NO GROWTH 5 DAYS Performed at [facility], [place] [address] [place], [place] [address]    Report Status 11/09/2018 FINAL  Final  Blood Culture (routine x 2)     Status: None   Collection Time: 11/04/18 11:26 AM  Result Value Ref Range Status   Specimen Description BLOOD BLOOD RIGHT HAND  Final   Special Requests   Final    AEROBIC BOTTLE ONLY Blood Culture results may not be optimal due to an inadequate volume of blood received in culture bottles   Culture   Final    NO GROWTH 5 DAYS Performed at [facility], [place] [address], [place],  [address]    Report Status 11/09/2018  FINAL  Final  MRSA PCR Screening     Status: Abnormal   Collection Time: 11/04/18 10:20 PM  Result Value Ref Range Status   MRSA by PCR POSITIVE (A) NEGATIVE Final    Comment:        The GeneXpert MRSA Assay (FDA approved for NASAL specimens only), is one component of a  comprehensive MRSA colonization surveillance program. It is not intended to diagnose MRSA infection nor to guide or monitor treatment for MRSA infections. RESULT CALLED TO, READ BACK BY AND VERIFIED WITH: A. Elayne Snare 0141 11/05/2018 T. TYSOR      Labs: Basic Metabolic Panel: Recent Labs  Lab 11/04/18 1122 11/05/18 0221 11/06/18 0802 11/07/18 0814 11/08/18 0240  NA 136 138 135 135 136  K 3.9 3.8 4.7 4.9 4.7  CL 94* 99 97* 91* 91*  CO2 32 31 35* 36* 41*  GLUCOSE 187* 201* 153* 122* 97  BUN 10 12 17 23 19   CREATININE 0.84 0.84 0.58 0.76 0.60  CALCIUM 8.3* 8.1* 8.4* 8.4* 8.4*  MG  --   --   --   --  2.0   Liver Function Tests: Recent Labs  Lab 11/04/18 1122 11/08/18 0240  AST 28 16  ALT 18 11  ALKPHOS 58 42  BILITOT 0.4 0.6  PROT 6.5 5.2*  ALBUMIN 3.8 2.6*   Recent Labs  Lab 11/04/18 1122  LIPASE 26   No results for input(s): AMMONIA in the last 168 hours. CBC: Recent Labs  Lab 11/04/18 1122 11/05/18 0221 11/06/18 0802 11/07/18 0814 11/08/18 0240  WBC 13.7* 5.8 15.7* 10.8* 7.9  NEUTROABS 7.7  --   --   --  5.5  HGB 12.2 10.3* 11.3* 10.9* 11.0*  HCT 40.9 32.4* 36.2 36.1 36.4  MCV 96.9 97.3 96.8 96.0 96.0  PLT 310 211 263 250 251   Cardiac Enzymes: Recent Labs  Lab 11/05/18 0221 11/05/18 0827 11/05/18 1421  TROPONINI 0.06* 0.04* <0.03   BNP: BNP (last 3 results) Recent Labs    02/12/18 2052  BNP 184.2*    ProBNP (last 3 results) No results for input(s): PROBNP in the last 8760 hours.  CBG: Recent Labs  Lab 11/08/18 1137 11/08/18 1727 11/08/18 2141 11/09/18 0820 11/09/18 1134  GLUCAP 138* 143* 117* 79 132*       Signed:  Domenic Polite MD.  [facility] 11/09/2018, 2:23 PM

## 2018-11-09 NOTE — Progress Notes (Signed)
Pt reported family could pick her up between 1600 - 1700 then RN was updated hour by hour that family would be delayed.   Pt's daughter called to report that she could not pick up pt until 0830 tomorrow 11/10/18.   RN informed MD. MD states pt's family can pick her up at 0830 tomorrow. No orders to be changed as pt is appropriate for discharge as of 11/09/18.

## 2018-11-09 NOTE — Care Management Note (Signed)
Case Management Note  Patient Details  Name: Megan [name] MRN: [id number] Date of Birth: 1951/08/30  Subjective/Objective:   From home, has home oxygen with [facility], she is for dc today, will need outpatient pt,  NCM made referral thru epic and spoke with patient and gave her the brochure, informed patient they the outpt rehab facility will be contacting her about her apts.                 Action/Plan: DC home today.   Expected Discharge Date:  11/09/18               Expected Discharge Plan:  OP Rehab  In-House Referral:     Discharge planning Services  CM Consult  Post Acute Care Choice:    Choice offered to:     DME Arranged:    DME Agency:     HH Arranged:    HH Agency:     Status of Service:  Completed, signed off  If discussed at [employer] of Stay Meetings, dates discussed:    Additional Comments:  Zenon Mayo, RN 11/09/2018, 12:31 PM

## 2018-11-09 NOTE — Progress Notes (Signed)
Physical Therapy Treatment Patient Details Name: Megan [name] MRN: [id number] DOB: 06-11-1951 Today's Date: 11/09/2018    History of Present Illness Pt. with PMH of COPD, chronic diastolic CHF, COPD, depression, HTN, CAD; admitted on 11/04/2018, presented with complaint of shortness of breath, was found to have COPD exacerbation secondary to influenza A    PT Comments    Patient doing well today, ambulating OOB without assistance needed. Reports mild weakness from baseline but discussed gradual progression and role of ongoing PT. Updated recs to OP PT. Pt happy to be going home today. No DOE with short distance ambulation.     Follow Up Recommendations  Outpatient PT     Equipment Recommendations       Recommendations for Other Services       Precautions / Restrictions Precautions Precautions: Fall Restrictions Weight Bearing Restrictions: No    Mobility  Bed Mobility                  Transfers Overall transfer level: Modified independent                  Ambulation/Gait Ambulation/Gait assistance: Modified independent (Device/Increase time) Gait Distance (Feet): 20 Feet Assistive device: None Gait Pattern/deviations: Step-to pattern;Step-through pattern Gait velocity: decreased   General Gait Details: Pt walking in room back and forth, no balance issues, no DOE on 3L O2. patient states she feels mild weakness but confident she will regain her strength as she starts walking more at home. discussed gradual progression and role of ongoing PT to assist safely   Stairs             Wheelchair Mobility    Modified Rankin (Stroke Patients Only)       Balance Overall balance assessment: Mild deficits observed, not formally tested                                          Cognition Arousal/Alertness: Awake/alert Behavior During Therapy: WFL for tasks assessed/performed Overall Cognitive Status: Within Functional Limits for tasks  assessed                                        Exercises      General Comments        Pertinent Vitals/Pain Pain Assessment: Faces Faces Pain Scale: Hurts a little bit Pain Location: chest, coughing Pain Descriptors / Indicators: Aching Pain Intervention(s): Limited activity within patient's tolerance;Monitored during session;Premedicated before session    Home Living                      Prior Function            PT Goals (current goals can now be found in the care plan section) Acute Rehab PT Goals Patient Stated Goal: to stop having to urinate and get some rest PT Goal Formulation: With patient Potential to Achieve Goals: Good Progress towards PT goals: Progressing toward goals    Frequency    Min 3X/week      PT Plan Discharge plan needs to be updated    Co-evaluation              AM-PAC PT "6 Clicks" Mobility   Outcome Measure  Help needed turning from your back to your side while in  a flat bed without using bedrails?: None Help needed moving from lying on your back to sitting on the side of a flat bed without using bedrails?: None Help needed moving to and from a bed to a chair (including a wheelchair)?: None Help needed standing up from a chair using your arms (e.g., wheelchair or bedside chair)?: None Help needed to walk in hospital room?: A Little Help needed climbing 3-5 steps with a railing? : A Little 6 Click Score: 22    End of Session Equipment Utilized During Treatment: Gait belt Activity Tolerance: Patient tolerated treatment well Patient left: in bed Nurse Communication: Mobility status PT Visit Diagnosis: Difficulty in walking, not elsewhere classified (R26.2)     Time: 1130-1145 PT Time Calculation (min) (ACUTE ONLY): 15 min  Charges:  $Gait Training: 8-22 mins                     Reinaldo Berber, PT, DPT Acute Rehabilitation Services Pager: [phone] Office: [phone]     Reinaldo Berber 11/09/2018, 11:46 AM

## 2018-11-10 LAB — GLUCOSE, CAPILLARY
GLUCOSE-CAPILLARY: 202 mg/dL — AB (ref 70–99)
Glucose-Capillary: 87 mg/dL (ref 70–99)

## 2018-11-10 MED ORDER — LIDOCAINE 5 % EX PTCH: 1.0000 | MEDICATED_PATCH | CUTANEOUS | 0 refills | Status: DC

## 2018-11-10 NOTE — Plan of Care (Signed)
  Problem: Activity: Goal: Ability to tolerate increased activity will improve Outcome: Progressing   Problem: Respiratory: Goal: Ability to maintain adequate ventilation will improve Outcome: Progressing   Problem: Respiratory: Goal: Ability to maintain a clear airway will improve Outcome: Progressing

## 2018-11-10 NOTE — Progress Notes (Addendum)
Discharge instructions read and given to patient. Pt verbalize understanding.  Pt states family home care aid will be here to pick her up around 3pm

## 2018-11-15 ENCOUNTER — Inpatient Hospital Stay: Payer: Medicare Other | Admitting: Internal Medicine

## 2018-11-18 ENCOUNTER — Telehealth: Payer: Self-pay | Admitting: Internal Medicine

## 2018-11-18 MED ORDER — PREDNISONE 10 MG PO TABS: ORAL_TABLET | ORAL | 0 refills | Status: DC

## 2018-11-18 NOTE — Telephone Encounter (Signed)
D/c on tapering pred and worse since stopped Has appt for p hosp f/u 1/24  rec  Prednisone 10 mg take  4 each am x 2 days,   2 each am x 2 days,  1 each am x 2 days and stop   Rec re-eval pred need at f/u ov

## 2018-11-20 ENCOUNTER — Other Ambulatory Visit: Payer: Self-pay

## 2018-11-20 NOTE — Patient Outreach (Addendum)
[place] [facility]) Care Management  11/20/2018  Megan [name] [birth year] [id number]   EMMI-COPD RED ON EMMI ALERT Day # 5 Date: 11/18/2018 Red Alert Reason: " Harder to breathe? Yes  #of times rescue inhaler used in past 24 hrs? 4"    Outgoing call to patient again per her previous request. Patient state that she is doing fairly well. Reviewed and addressed red alerts with patient. She reports that she does continue to have some SOB at times at times with exertion and occassionally at rest as well. She called on call MD over the weekend. MD started her back on tapering dose of Prednisone. She is also on oxygen at 3L/min via [place]. Patient taking nebulizer treatments about 2-3x/day. She voices that she did have to use her rescue inhaler about four times the other day. She is aware of worsening s/s and when to seek medical attention. Patient has MD follow up appt on 11/24/2018 but will contact MD sooner if symptoms worsen and/or unresolved. Patient reports decreased appetite but has been drinking nutritional supplements but voices financial hardship being able to pay for them. Advised patient that RN CM would mail out coupons if some available in the office. She denies any further RN CM needs or concerns at this time. Advised patient that they would continue to get automated EMMI- COPD post discharge calls to assess how they are doing following recent hospitalization and will receive a call from a nurse if any of their responses were abnormal. Patient voiced understanding and was appreciative of f/u call.   Plan: RN CM will close case as no further interventions needed. RN CM will mail out Ensure coupons to patient via mail.   Enzo Montgomery, RN,BSN,CCM [place] [facility] Telephonic Care Management Coordinator Direct Phone: [phone] Toll Free: [phone] Fax: [phone]

## 2018-11-20 NOTE — Patient Outreach (Addendum)
[place] [facility]) Care Management  11/20/2018  Megan [name] 1951-10-21 [id number]    EMMI-COPD RED ON EMMI ALERT Day # 5 Date: 11/18/2018 Red Alert Reason: " Harder to breathe? Yes  #of times rescue inhaler used in past 24 hrs? 4"   Outreach attempt # 1 to patient. Spoke briefly with patient as she states she was asleep and phone woke her up. Patient requested a call back at another time when is awake and alert.       Plan: RN CM will make outreach attempt to patient within 3-4 business days.   Enzo Montgomery, RN,BSN,CCM [place] [facility] Telephonic Care Management Coordinator Direct Phone: [phone] Toll Free: [phone] Fax: [phone]

## 2018-11-22 ENCOUNTER — Other Ambulatory Visit: Payer: Self-pay

## 2018-11-22 NOTE — Patient Outreach (Signed)
[place] [facility]) Care Management  11/22/2018  Megan [name] Mar 03, 1951 [id number]    EMMI-COPD RED ON EMMI ALERT Day # 8 Date: 11/21/2018 Red Alert Reason: "# of times rescue inhaler used in past 24 hrs? 3"    Red on EMMI dashboard received. No outreach call warranted to patient at this time. RN CM addressed issue on previous call. Patient using rescue inhaler 3-4x/day until further eval by MD at appt this week.     Plan: RN CM will close case at this time.   Enzo Montgomery, RN,BSN,CCM [place] [facility] Telephonic Care Management Coordinator Direct Phone: [phone] Toll Free: [phone] Fax: [phone]

## 2018-11-22 NOTE — Progress Notes (Deleted)
@Patient  ID: Megan [name], female    DOB: 1951/02/26, 68 y.o.   MRN: [id number]  No chief complaint on file.   Referring provider: [name], MD  HPI:  68 year old female former smoker followed in our office for pulmonary emphysema, COPD, chronic respiratory failure (managed on home trilogy ventilator for hypercarbia), allergic rhinitis  PMH: Anxiety, PTSD, protein deficient malnutrition Smoker/ Smoking History: Former smoker.  Quit 2001.  30-pack-year smoking history. Maintenance: Trelegy Ellipta Pt of: Dr. Lamonte Sakai   11/22/2018  - Visit   68 year old female former smoker with severe centrilobular emphysema presenting to our office today for hospital follow-up.  Patient was previously admitted in the hospital for 6 days following contracting influenza A and a COPD exacerbation.  This did require the patient to be managed on BiPAP for a few days due to her hypercarbia.  Patient is typically maintained at home on a trilogy ventilato vent encounter r for baseline hypercarbia.  Patient was discharged on a prednisone taper and needed an additional 1 via tele-encounter by the on-call physician from our office.  Trelegy Ellipta use [place] use Pulmonary rehab Oxygen use Follow-up with primary care and GI    Tests:  11/04/2018-chest x-ray-emphysema with large lung volumes, old granulomatous disease, scattered calcified granulomas in the lungs  01/03/2017-CT Angio- marked hyperinflation, severe centrilobular emphysema, scattered stable linear nodular opacities in the periphery of both lungs, negative for PE, patchy consolidation in both lungs right greater than left may represent pneumonia  02/23/2018-echocardiogram- LV ejection fraction 60 to 65%, mild LVH, grade 1 diastolic dysfunction   FENO:  No results found for: NITRICOXIDE  PFT: No flowsheet data found.  Imaging: Dg Chest Port 1 View  Result Date: 11/04/2018 CLINICAL DATA:  Sepsis and hypoxia.  Cough and wheezing. EXAM: PORTABLE  CHEST 1 VIEW COMPARISON:  05/03/2018 FINDINGS: Tapering of the peripheral pulmonary vasculature favors emphysema. Scattered calcified granulomas in the lungs. Heart size within normal limits. Atherosclerotic calcification of the aortic arch. Mild bony demineralization. IMPRESSION: 1. Emphysema with large lung volumes. 2. Old granulomatous disease. 3. Bony demineralization. Electronically Signed   By: Van Clines M.D.   On: 11/04/2018 11:10      Specialty Problems      Pulmonary Problems   Multiple pulmonary nodules determined by computed tomography of lung   COPD (chronic obstructive pulmonary disease) (HCC)   Chronic respiratory failure with hypoxia and hypercapnia (HCC)   Acute on Chronic respiratory failure with hypoxia and SEVERE Hypercapnia (HCC)   COPD, very severe/End- Stage with Severe Hypercarpnia and Hypoxia   Allergic rhinitis   Chronic respiratory failure with hypoxia (HCC)   Respiratory failure (HCC)      Allergies  Allergen Reactions  . Penicillins Rash and Hives    Has patient had a PCN reaction causing immediate rash, facial/tongue/throat swelling, SOB or lightheadedness with hypotension: No Has patient had a PCN reaction causing severe rash involving mucus membranes or skin necrosis:NO Has patient had a PCN reaction that required hospitalization No Has patient had a PCN reaction occurring within the last 10 years:NO If all of the above answers are "NO", then may proceed with Cephalosporin use.    Immunization History  Administered Date(s) Administered  . HiB (PRP-OMP) 09/10/2015  . Influenza, High Dose Seasonal PF 08/27/2016, 07/25/2018  . Influenza,inj,Quad PF,6+ Mos 09/16/2017  . Pneumococcal Conjugate-13 09/16/2017  . Tdap 09/16/2017    Past Medical History:  Diagnosis Date  . Anemia   . Anxiety state 08/20/2015  . CAP (  community acquired pneumonia)   . CHF (congestive heart failure) ([place])   . COPD (chronic obstructive pulmonary disease) ([place])     . Depression   . Essential hypertension 08/19/2015  . GERD (gastroesophageal reflux disease)   . Headache   . History of hiatal hernia   . Myocardial infarction ([place])   . Shortness of breath dyspnea     Tobacco History: Social History   Tobacco Use  Smoking Status Former Smoker  . Packs/day: 1.00  . Years: 30.00  . Pack years: 30.00  . Types: Cigarettes  . Last attempt to quit: 08/18/2000  . Years since quitting: 18.2  Smokeless Tobacco Never Used   Counseling given: Not Answered   Outpatient Encounter Medications as of 11/24/2018  Medication Sig  . ALPRAZolam (XANAX) 0.25 MG tablet TAKE 1 TABLET(0.25 MG) BY MOUTH TWICE DAILY AS NEEDED FOR ANXIETY (Patient taking differently: Take 0.25 mg by mouth at bedtime as needed for anxiety. TAKE 1 TABLET(0.25 MG) BY MOUTH TWICE DAILY AS NEEDED FOR ANXIETY)  . aspirin EC 81 MG tablet Take 81 mg by mouth daily.  . budesonide (PULMICORT) 0.5 MG/2ML nebulizer solution Take 2 mLs (0.5 mg total) by nebulization 2 (two) times daily.  . butalbital-acetaminophen-caffeine (FIORICET, ESGIC) 50-325-40 MG tablet Take 1 tablet by mouth 2 (two) times daily as needed for headache.  . diltiazem (CARDIZEM CD) 240 MG 24 hr capsule Take 1 capsule (240 mg total) by mouth daily.  . feeding supplement, ENSURE ENLIVE, (ENSURE ENLIVE) LIQD Take 237 mLs by mouth 4 (four) times daily.  . fluticasone (FLONASE) 50 MCG/ACT nasal spray Place 2 sprays into both nostrils daily.  Marland Kitchen HYDROcodone-acetaminophen (NORCO) 7.5-325 MG tablet Take 1 tablet by mouth every 6 (six) hours as needed for moderate pain.  Marland Kitchen levalbuterol (XOPENEX) 1.25 MG/0.5ML nebulizer solution Take 1.25 mg by nebulization every 6 (six) hours.  . lidocaine (LIDODERM) 5 % Place 1 patch onto the skin daily. Remove & Discard patch within 12 hours or as directed by MD  . loratadine (CLARITIN) 10 MG tablet Take 1 tablet (10 mg total) daily by mouth.  . metoprolol tartrate (LOPRESSOR) 25 MG tablet Take 0.5  tablets (12.5 mg total) by mouth 2 (two) times daily.  . montelukast (SINGULAIR) 10 MG tablet Take 1 tablet (10 mg total) by mouth at bedtime.  . Multiple Vitamin (TAB-A-VITE/BETA CAROTENE) TABS Take 1 tablet by mouth daily.  . pantoprazole (PROTONIX) 40 MG tablet Take 1 tablet (40 mg total) by mouth daily.  . polyethylene glycol (MIRALAX / GLYCOLAX) packet Take 17 g by mouth 2 (two) times daily. (Patient taking differently: Take 17 g by mouth daily as needed for mild constipation. )  . predniSONE (DELTASONE) 10 MG tablet Prednisone 10 mg take  4 each am x 2 days,   2 each am x 2 days,  1 each am x 2 days and stop  . senna-docusate (SENOKOT-S) 8.6-50 MG tablet Take 1 tablet by mouth 2 (two) times daily. (Patient taking differently: Take 1 tablet by mouth at bedtime as needed for mild constipation. )  . sertraline (ZOLOFT) 50 MG tablet Take 1 tablet (50 mg total) by mouth daily. (Patient taking differently: Take 50 mg by mouth daily as needed (for anxiety). )  . TRELEGY ELLIPTA 100-62.5-25 MCG/INH AEPB INHALE 1 PUFF INTO THE LUNGS DAILY (Patient taking differently: Inhale 1 puff into the lungs daily. )  . VENTOLIN HFA 108 (90 Base) MCG/ACT inhaler INHALE 2 PUFFS INTO THE LUNGS EVERY 6 HOURS  AS NEEDED FOR WHEEZING OR SHORTNESS OF BREATH (Patient taking differently: Inhale 1-2 puffs into the lungs every 6 (six) hours as needed for wheezing. )   No facility-administered encounter medications on file as of 11/24/2018.      Review of Systems  Review of Systems   Physical Exam  There were no vitals taken for this visit.  Wt Readings from Last 5 Encounters:  11/04/18 89 lb 11.6 oz (40.7 kg)  09/22/18 92 lb 6.4 oz (41.9 kg)  07/25/18 98 lb 2 oz (44.5 kg)  05/03/18 86 lb (39 kg)  04/18/18 87 lb (39.5 kg)     Physical Exam    Lab Results:  CBC    Component Value Date/Time   WBC 7.9 11/08/2018 0240   RBC 3.79 (L) 11/08/2018 0240   HGB 11.0 (L) 11/08/2018 0240   HCT 36.4 11/08/2018 0240     PLT 251 11/08/2018 0240   MCV 96.0 11/08/2018 0240   MCH 29.0 11/08/2018 0240   MCHC 30.2 11/08/2018 0240   RDW 12.8 11/08/2018 0240   LYMPHSABS 1.4 11/08/2018 0240   MONOABS 1.0 11/08/2018 0240   EOSABS 0.0 11/08/2018 0240   BASOSABS 0.0 11/08/2018 0240    BMET    Component Value Date/Time   NA 136 11/08/2018 0240   K 4.7 11/08/2018 0240   CL 91 (L) 11/08/2018 0240   CO2 41 (H) 11/08/2018 0240   GLUCOSE 97 11/08/2018 0240   BUN 19 11/08/2018 0240   CREATININE 0.60 11/08/2018 0240   CALCIUM 8.4 (L) 11/08/2018 0240   GFRNONAA >60 11/08/2018 0240   GFRAA >60 11/08/2018 0240    BNP    Component Value Date/Time   BNP 184.2 (H) 02/12/2018 2052    ProBNP No results found for: PROBNP    Assessment & Plan:     No problem-specific Assessment & Plan notes found for this encounter.     Lauraine Rinne, NP 11/22/2018   This appointment was *** with over 50% of the time in direct face-to-face patient care, assessment, plan of care, and follow-up.

## 2018-11-24 ENCOUNTER — Inpatient Hospital Stay: Payer: Medicare Other | Admitting: Pulmonary Disease

## 2018-11-27 ENCOUNTER — Other Ambulatory Visit: Payer: Self-pay

## 2018-11-27 NOTE — Patient Outreach (Signed)
[place] [facility]) Care Management  11/27/2018  Megan [name] 01/18/1951 [id number]    EMMI-COPD RED ON EMMI ALERT Day # 11 Date: 11/24/2018 Red Alert Reason: " # of times rescue inhaler used in past 24 hrs"   Outreach attempt # 1 to patient. Spoke with patient. Reviewed and addressed red alert.She continues to report that she is having to use rescue inhaler 3-4/day. Patient has had COPD for several years and states she knows how to "listen" to her body and what to do when she is not feeling well. She reports that she is still coughing up phlegm. She recently started taking OTC Mucinex to help. RN CM confirmed with patient that she is doing breathing treatments 2-3x/day and wearing oxygen as advised. She states that her pulse ox is 96-97% on 3L/min. Patient had to cancel MD follow up appt as her daughter was unable to take her to appt. She reports that she will call office today and reschedule appt. RN CM confirmed that patient will have transportation to appt. She voiced that her daughter will be able to take her. Patient instructeded to  call RN CM if she needs transportation assistance to appt. She voiced understanding and denies any further RN CM needs or concerns. Advised patient that she is almost done with automated post discharge EMMI-COPD calls and will receive a call from RN CM for any abnormal responses. She voiced understanding and was appreciative of follow up call.   Plan: RN CM will close case as no further interventions needed at this time.   Enzo Montgomery, RN,BSN,CCM [place] [facility] Telephonic Care Management Coordinator Direct Phone: [phone] Toll Free: [phone] Fax: [phone]

## 2018-11-28 ENCOUNTER — Other Ambulatory Visit: Payer: Self-pay

## 2018-11-28 NOTE — Patient Outreach (Signed)
[place] [facility]) Care Management  11/28/2018  Megan [name] 1951/09/13 [id number]    EMMI-COPD RED ON EMMI ALERT Day # 4 Date: 11/24/2018 Red Alert Reason: " # of times rescue inhaler used in 24hr? 4  Been to follow up appt?No  Scheduled follow up? No"   Red on EMMI dashboard received. No outreach call warranted to patient at this time. RN CM addressed issue on previous call.    Plan: RN CM will close case at this time.   Enzo Montgomery, RN,BSN,CCM [place] Management Telephonic Care Management Coordinator Direct Phone: [phone] Toll Free: [phone] Fax: [phone]

## 2018-12-02 ENCOUNTER — Other Ambulatory Visit: Payer: Self-pay | Admitting: Internal Medicine

## 2018-12-02 DIAGNOSIS — F4312 Post-traumatic stress disorder, chronic: Secondary | ICD-10-CM

## 2018-12-04 NOTE — Telephone Encounter (Signed)
Done erx 

## 2018-12-05 ENCOUNTER — Other Ambulatory Visit: Payer: Self-pay | Admitting: Internal Medicine

## 2018-12-11 ENCOUNTER — Inpatient Hospital Stay: Payer: Medicare Other | Admitting: Internal Medicine

## 2018-12-18 ENCOUNTER — Inpatient Hospital Stay: Payer: Medicare Other | Admitting: Internal Medicine

## 2019-01-04 ENCOUNTER — Other Ambulatory Visit: Payer: Self-pay | Admitting: Emergency Medicine

## 2019-01-05 ENCOUNTER — Other Ambulatory Visit: Payer: Self-pay

## 2019-01-05 MED ORDER — DILTIAZEM HCL ER COATED BEADS 240 MG PO CP24: 240.0000 mg | ORAL_CAPSULE | Freq: Every day | ORAL | 2 refills | Status: DC

## 2019-01-07 ENCOUNTER — Telehealth: Payer: Self-pay | Admitting: Pulmonary Disease

## 2019-01-07 NOTE — Telephone Encounter (Signed)
Pt called to report dyspnea,no cough, fevers or sputum production She is unable to come in to clinic for the next few days due to transportation issues.   Prednisone taper called into pharmacy Start at 40 mg. Reduce dose by 10 mg every 3 days.   Please call her next week to make a follow up appointment. She has not been seen since her recent discharge.   Marshell Garfinkel MD [facility] 01/07/2019, 4:06 PM

## 2019-01-08 NOTE — Telephone Encounter (Signed)
Called and spoke with patient, tried to make a follow up appointment for her and she stated that she is unable to make one right now. Continued to try and make an appointment for patient and she had a few different reasons as to why she couldn't make one. Stated she will call back.

## 2019-01-18 ENCOUNTER — Other Ambulatory Visit: Payer: Self-pay

## 2019-01-18 ENCOUNTER — Ambulatory Visit (INDEPENDENT_AMBULATORY_CARE_PROVIDER_SITE_OTHER): Payer: Medicare Other | Admitting: Emergency Medicine

## 2019-01-18 ENCOUNTER — Encounter: Payer: Self-pay | Admitting: Emergency Medicine

## 2019-01-18 DIAGNOSIS — J9611 Chronic respiratory failure with hypoxia: Secondary | ICD-10-CM | POA: Diagnosis not present

## 2019-01-18 DIAGNOSIS — J439 Emphysema, unspecified: Secondary | ICD-10-CM

## 2019-01-18 DIAGNOSIS — J301 Allergic rhinitis due to pollen: Secondary | ICD-10-CM | POA: Diagnosis not present

## 2019-01-18 DIAGNOSIS — J9612 Chronic respiratory failure with hypercapnia: Secondary | ICD-10-CM

## 2019-01-18 MED ORDER — PREDNISONE 10 MG PO TABS: 10.0000 mg | ORAL_TABLET | Freq: Every day | ORAL | 2 refills | Status: DC

## 2019-01-18 MED ORDER — FLUTICASONE PROPIONATE 50 MCG/ACT NA SUSP: 2.0000 | Freq: Every day | NASAL | 5 refills | Status: DC

## 2019-01-18 MED ORDER — ALBUTEROL SULFATE (2.5 MG/3ML) 0.083% IN NEBU: 2.5000 mg | INHALATION_SOLUTION | Freq: Four times a day (QID) | RESPIRATORY_TRACT | 5 refills | Status: DC | PRN

## 2019-01-18 NOTE — Assessment & Plan Note (Signed)
Please continue Singulair, loratadine as you have been taking them Restart your fluticasone nasal spray, 2 sprays each nostril once daily. Agree that you might benefit from adding nasal saline rinses once daily

## 2019-01-18 NOTE — Assessment & Plan Note (Signed)
Please continue Trelegy 1 inhalation daily.  Remember rinse and gargle after using. Keep your albuterol inhaler available to use 2 puffs if needed for shortness of breath, chest tightness, wheezing. We will refill albuterol nebulizers.  You can use these instead of your inhaler, up to every 4 hours if needed for shortness of breath We will continue low-dose prednisone, 10 mg once daily until your next visit.  Keep track of whether this helps your symptoms, breathing, congestion

## 2019-01-18 NOTE — Addendum Note (Signed)
Addended by: Desmond Dike C on: 01/18/2019 10:04 AM   Modules accepted: Orders

## 2019-01-18 NOTE — Progress Notes (Signed)
Subjective:    Patient ID: Megan [name], female    DOB: July 17, 1951, 68 y.o.   MRN: [id number]  HPI 68 year old former smoker with a history of Severe COPD, past hospitalization for acute on chronic respiratory failure 01/2017 requiring tracheostomy.  She also has hypertension with diastolic CHF, secondary pulmonary hypertension pulmonary nodular disease.  I have never actually seen her in the office although she has been seen by others here.  Most recently by Dr. Elsworth Soho in April.  She was then admitted 02/13/2018 with an exacerbation and acute on chronic respiratory failure.  She was treated for septic shock, required BiPAP.  Her RVP was positive for metapneumovirus.   She is currently on Advair, Incruse. She has xopenex HFA, uses about 4x a day. Her O2 is at 2-4L/min. She has a Trilogy vent that she uses at night through [facility]. She has a full face mask. She is off prednisone, it was tapered after the hospitalization. Her daily functional capacity is limited. She has daily cough, more for the last week. Thick clear mucous.  ROV 01/18/2019 --68 year old woman with severe COPD, hypertension with diastolic CHF, secondary pulmonary hypertension, chronic respiratory failure with previous critical care admission that required mechanical ventilation, ultimately tracheostomy.  She was admitted with acute on chronic respiratory failure and influenza A in January 2020.  Last seen in our office in November, currently on Trelegy and Singulair, loratadine, oxygen at 2.5 to 3.5 L/min. Doesn't have her fluticasone NS right now.  She uses albuterol approximately 4-8 times a day. She was treated with another pred taper beginning 3/8, finishing this now.  She has a trilogy ventilator, uses it pretty reliably but now always due to nasal congestion. She has noticed some chills occasionally, a more chronic problem - happens 3-4x a day. No sweats or fevers. She has occasional cough, not every day. She is having increased nasal  congestion - wonders about adding saline.    Review of Systems  Past Medical History:  Diagnosis Date   Anemia    Anxiety state 08/20/2015   CAP (community acquired pneumonia)    CHF (congestive heart failure) (HCC)    COPD (chronic obstructive pulmonary disease) ([place])    Depression    Essential hypertension 08/19/2015   GERD (gastroesophageal reflux disease)    Headache    History of hiatal hernia    Myocardial infarction (HCC)    Shortness of breath dyspnea      Family History  Problem Relation Age of Onset   Cirrhosis Mother    Depression Mother    Heart disease Father      Social History   Socioeconomic History   Marital status: Widowed    Spouse name: Not on file   Number of children: 3   Years of education: 12   Highest education level: Not on file  Occupational History   Occupation: Disability  Social Designer, fashion/clothing strain: Not on file   Food insecurity:    Worry: Not on file    Inability: Not on file   Transportation needs:    Medical: Not on file    Non-medical: Not on file  Tobacco Use   Smoking status: Former Smoker    Packs/day: 1.00    Years: 30.00    Pack years: 30.00    Types: Cigarettes    Last attempt to quit: 08/18/2000    Years since quitting: 18.4   Smokeless tobacco: Never Used  Substance and Sexual Activity  Alcohol use: No   Drug use: No   Sexual activity: Never  Lifestyle   Physical activity:    Days per week: Not on file    Minutes per session: Not on file   Stress: Not on file  Relationships   Social connections:    Talks on phone: Not on file    Gets together: Not on file    Attends religious service: Not on file    Active member of club or organization: Not on file    Attends meetings of clubs or organizations: Not on file    Relationship status: Not on file   Intimate partner violence:    Fear of current or ex partner: Not on file    Emotionally abused: Not on file     Physically abused: Not on file    Forced sexual activity: Not on file  Other Topics Concern   Not on file  Social History Narrative   Denies abuse and feels safe at home.   has worked in [employer] before, also Educational psychologist, Chief Operating Officer.  [place], [place], [place]   Allergies  Allergen Reactions   Penicillins Rash and Hives    Has patient had a PCN reaction causing immediate rash, facial/tongue/throat swelling, SOB or lightheadedness with hypotension: No Has patient had a PCN reaction causing severe rash involving mucus membranes or skin necrosis:NO Has patient had a PCN reaction that required hospitalization No Has patient had a PCN reaction occurring within the last 10 years:NO If all of the above answers are "NO", then may proceed with Cephalosporin use.     Outpatient Medications Prior to Visit  Medication Sig Dispense Refill   ALPRAZolam (XANAX) 0.25 MG tablet TAKE 1 TABLET(0.25 MG) BY MOUTH TWICE DAILY AS NEEDED FOR ANXIETY 60 tablet 2   aspirin EC 81 MG tablet Take 81 mg by mouth daily.     butalbital-acetaminophen-caffeine (FIORICET, ESGIC) 50-325-40 MG tablet Take 1 tablet by mouth 2 (two) times daily as needed for headache. 30 tablet 0   diltiazem (CARDIZEM CD) 240 MG 24 hr capsule Take 1 capsule (240 mg total) by mouth daily. 30 capsule 2   feeding supplement, ENSURE ENLIVE, (ENSURE ENLIVE) LIQD Take 237 mLs by mouth 4 (four) times daily. 237 mL 0   fluticasone (FLONASE) 50 MCG/ACT nasal spray Place 2 sprays into both nostrils daily. 16 g 6   HYDROcodone-acetaminophen (NORCO) 7.5-325 MG tablet Take 1 tablet by mouth every 6 (six) hours as needed for moderate pain. 60 tablet 0   levalbuterol (XOPENEX) 1.25 MG/0.5ML nebulizer solution Take 1.25 mg by nebulization every 6 (six) hours. 30 each 12   lidocaine (LIDODERM) 5 % Place 1 patch onto the skin daily. Remove & Discard patch within 12 hours or as directed by MD 10 patch 0   loratadine (CLARITIN) 10 MG tablet TAKE 1 TABLET BY MOUTH  DAILY 90 tablet 3   metoprolol tartrate (LOPRESSOR) 25 MG tablet Take 0.5 tablets (12.5 mg total) by mouth 2 (two) times daily. 30 tablet 1   montelukast (SINGULAIR) 10 MG tablet Take 1 tablet (10 mg total) by mouth at bedtime. 30 tablet 11   Multiple Vitamin (TAB-A-VITE/BETA CAROTENE) TABS Take 1 tablet by mouth daily.     pantoprazole (PROTONIX) 40 MG tablet Take 1 tablet (40 mg total) by mouth daily. 30 tablet 1   polyethylene glycol (MIRALAX / GLYCOLAX) packet Take 17 g by mouth 2 (two) times daily. (Patient taking differently: Take 17 g by mouth daily as needed for  mild constipation. ) 14 each 0   senna-docusate (SENOKOT-S) 8.6-50 MG tablet Take 1 tablet by mouth 2 (two) times daily. (Patient taking differently: Take 1 tablet by mouth at bedtime as needed for mild constipation. ) 120 tablet 1   sertraline (ZOLOFT) 50 MG tablet Take 1 tablet (50 mg total) by mouth daily. (Patient taking differently: Take 50 mg by mouth daily as needed (for anxiety). ) 30 tablet 2   TRELEGY ELLIPTA 100-62.5-25 MCG/INH AEPB INHALE 1 PUFF INTO THE LUNGS DAILY (Patient taking differently: Inhale 1 puff into the lungs daily. ) 60 each 5   VENTOLIN HFA 108 (90 Base) MCG/ACT inhaler INHALE 2 PUFFS INTO THE LUNGS EVERY 6 HOURS AS NEEDED FOR WHEEZING OR SHORTNESS OF BREATH 1 Inhaler 0   predniSONE (DELTASONE) 10 MG tablet Prednisone 10 mg take  4 each am x 2 days,   2 each am x 2 days,  1 each am x 2 days and stop 14 tablet 0   budesonide (PULMICORT) 0.5 MG/2ML nebulizer solution Take 2 mLs (0.5 mg total) by nebulization 2 (two) times daily. 120 mL 0   No facility-administered medications prior to visit.          Objective:   Physical Exam Vitals:   01/18/19 0911  BP: 118/84  Pulse: 85  SpO2: 96%  Weight: 89 lb (40.4 kg)   Gen: Pleasant, very thin, in no distress on 2L/min,  normal affect  ENT: No lesions,  mouth clear,  oropharynx clear, upper denture, no postnasal drip  Neck: No JVD, no  stridor  Lungs: No use of accessory muscles, very distant, no wheeze on normal breath, she does have coarse wheeze on a forced exp  Cardiovascular: RRR, heart sounds normal, no murmur or gallops, no peripheral edema  Musculoskeletal: No deformities, no cyanosis or clubbing  Neuro: alert, non focal  Skin: Warm, no lesions or rash      Assessment & Plan:  COPD (chronic obstructive pulmonary disease) (HCC) Please continue Trelegy 1 inhalation daily.  Remember rinse and gargle after using. Keep your albuterol inhaler available to use 2 puffs if needed for shortness of breath, chest tightness, wheezing. We will refill albuterol nebulizers.  You can use these instead of your inhaler, up to every 4 hours if needed for shortness of breath We will continue low-dose prednisone, 10 mg once daily until your next visit.  Keep track of whether this helps your symptoms, breathing, congestion  Chronic respiratory failure with hypoxia and hypercapnia (HCC) Continue your oxygen at 2 to 4 L/min depending on your level of exertion. You need to wear your trilogy ventilator mask every night while sleeping.   Allergic rhinitis Please continue Singulair, loratadine as you have been taking them Restart your fluticasone nasal spray, 2 sprays each nostril once daily. Agree that you might benefit from adding nasal saline rinses once daily  [name], MD, PhD 01/18/2019, 9:39 AM [facility] [phone] or if no answer [phone]

## 2019-01-18 NOTE — Assessment & Plan Note (Signed)
Continue your oxygen at 2 to 4 L/min depending on your level of exertion. You need to wear your trilogy ventilator mask every night while sleeping.

## 2019-01-18 NOTE — Patient Instructions (Addendum)
Please continue Trelegy 1 inhalation daily.  Remember rinse and gargle after using. Keep your albuterol inhaler available to use 2 puffs if needed for shortness of breath, chest tightness, wheezing. We will refill albuterol nebulizers.  You can use these instead of your inhaler, up to every 4 hours if needed for shortness of breath We will continue low-dose prednisone, 10 mg once daily until your next visit.  Keep track of whether this helps your symptoms, breathing, congestion Continue your oxygen at 2 to 4 L/min depending on your level of exertion. You need to wear your trilogy ventilator mask every night while sleeping. Please continue Singulair, loratadine as you have been taking them Restart your fluticasone nasal spray, 2 sprays each nostril once daily. Agree that you might benefit from adding nasal saline rinses once daily Follow with Dr Lamonte Sakai in 1 month or next available to assess status on the prednisone

## 2019-01-26 ENCOUNTER — Other Ambulatory Visit: Payer: Self-pay | Admitting: Emergency Medicine

## 2019-02-16 ENCOUNTER — Other Ambulatory Visit: Payer: Self-pay | Admitting: Internal Medicine

## 2019-02-23 ENCOUNTER — Telehealth: Payer: Self-pay | Admitting: Internal Medicine

## 2019-02-23 DIAGNOSIS — F4312 Post-traumatic stress disorder, chronic: Secondary | ICD-10-CM

## 2019-02-23 MED ORDER — ALPRAZOLAM 0.25 MG PO TABS: ORAL_TABLET | ORAL | 2 refills | Status: DC

## 2019-02-23 NOTE — Telephone Encounter (Signed)
Done erx 

## 2019-02-26 IMAGING — CT CT HEAD W/O CM
3 series · 14 of 47 positions shown, 16 images · non-contrast
Comparison: None.

CLINICAL DATA: Posterior headache. Difficulty controlling headache.

EXAM:
CT HEAD WITHOUT CONTRAST
TECHNIQUE: Contiguous axial images were obtained from the base of the skull
through the vertex without intravenous contrast.

[Series 3: head 5.0 h30s · axial · 0.42mm/px · z∈[-372,-248]mm · 8 of 31 slices shown, 10 images]
[im 3/31  brain]
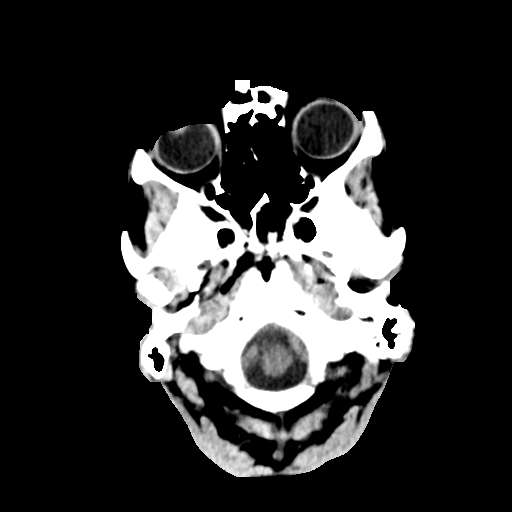
[im 3/31  bone]
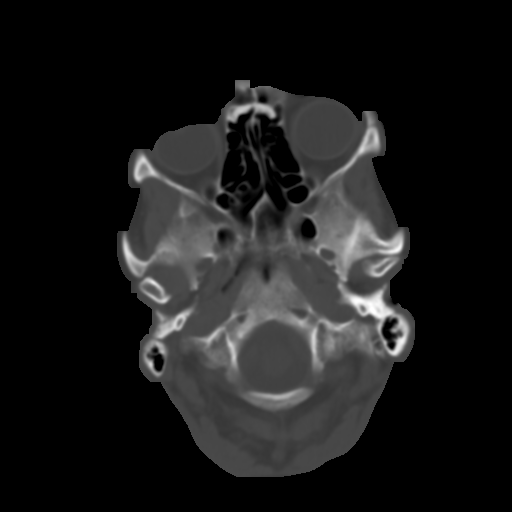
[im 7/31  brain]
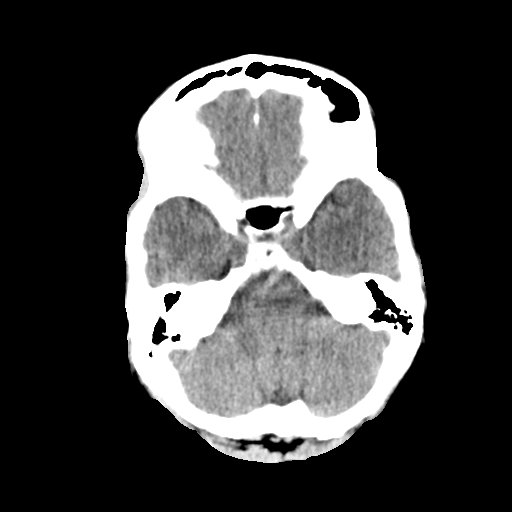
[im 10/31  brain]
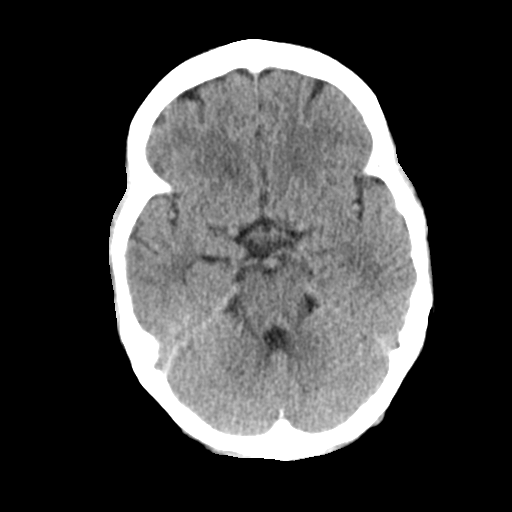
[im 14/31  brain]
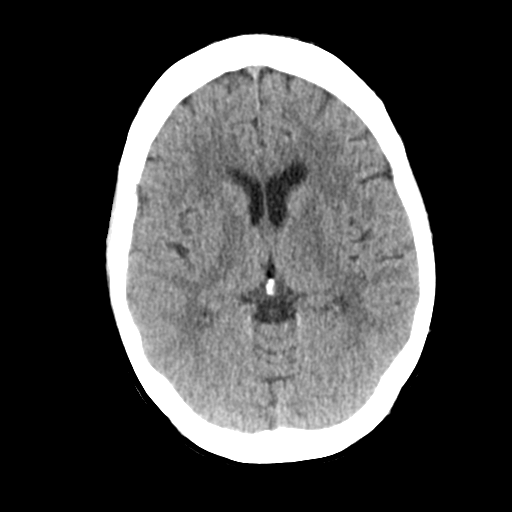
[im 17/31  brain]
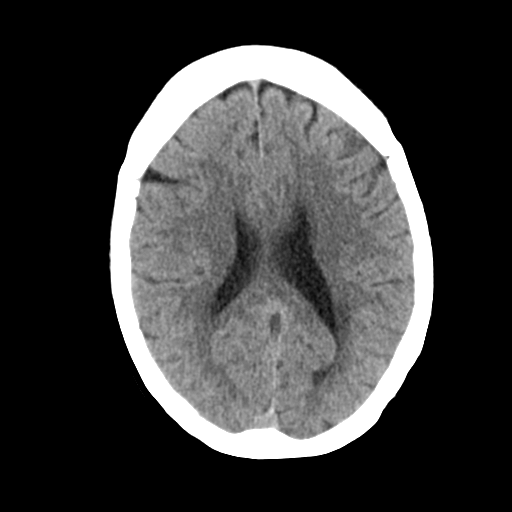
[im 17/31  bone]
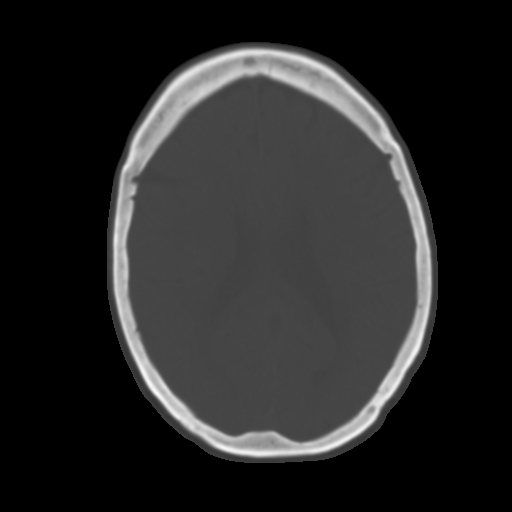
[im 21/31  brain]
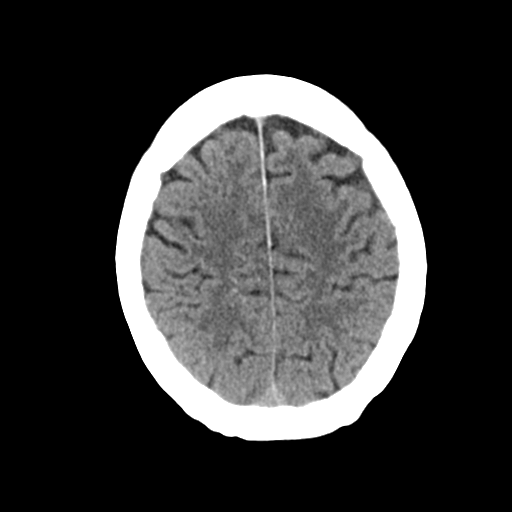
[im 24/31  brain]
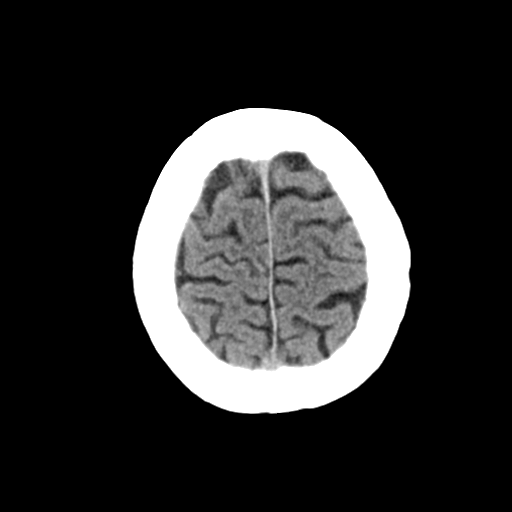
[im 28/31  brain]
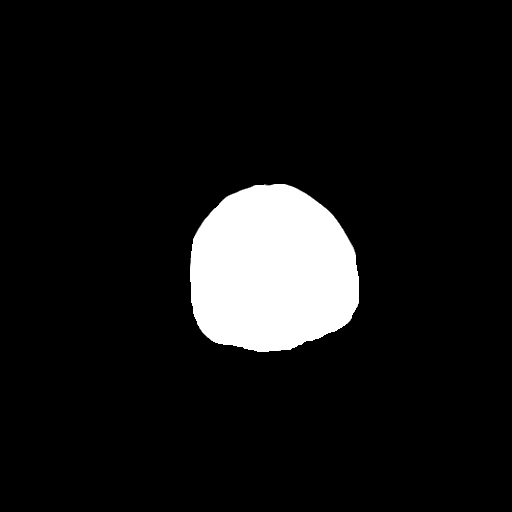

[Series 5: head 3.0 mpr cor · coronal · 0.30mm/px · 3 of 67 slices shown]
[im 23/67  brain]
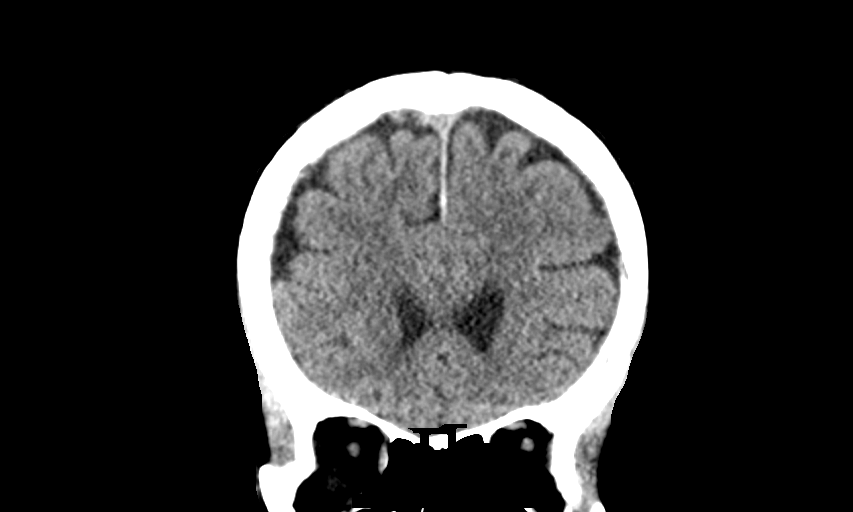
[im 30/67  brain]
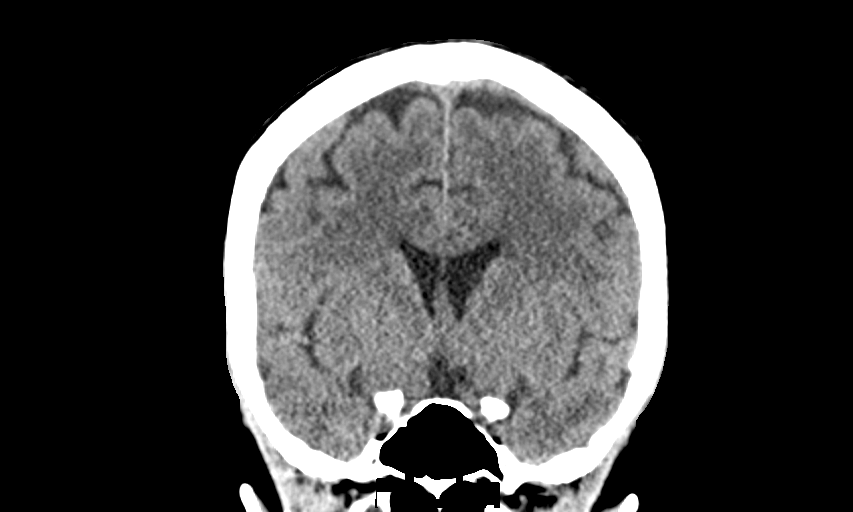
[im 37/67  brain]
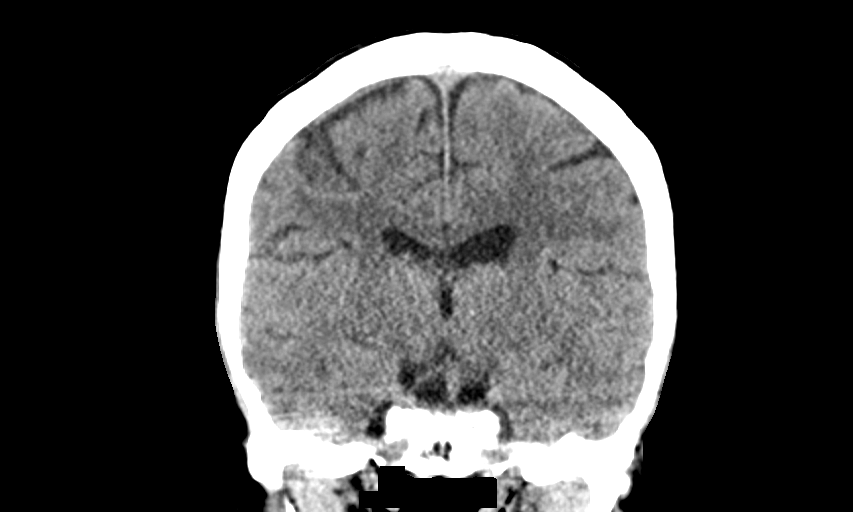

[Series 6: head 3.0 mpr sag · sagittal · 0.30mm/px · 3 of 67 slices shown]
[im 23/67  brain]
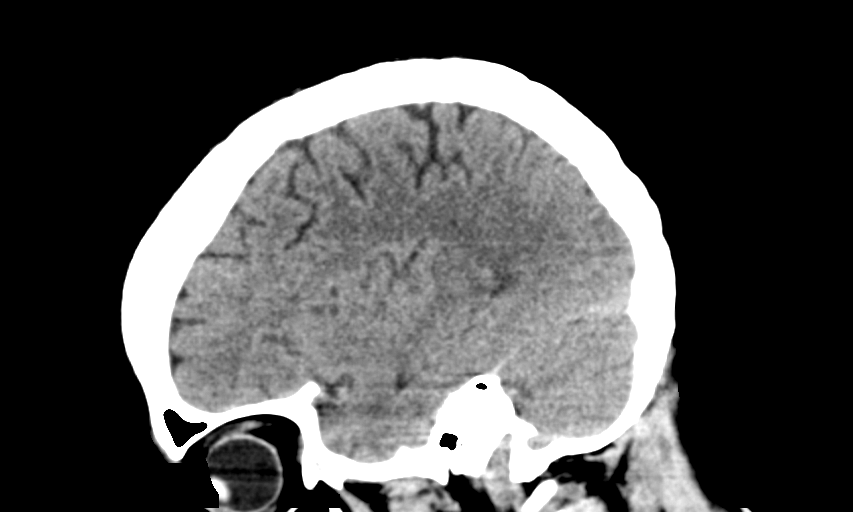
[im 34/67  brain]
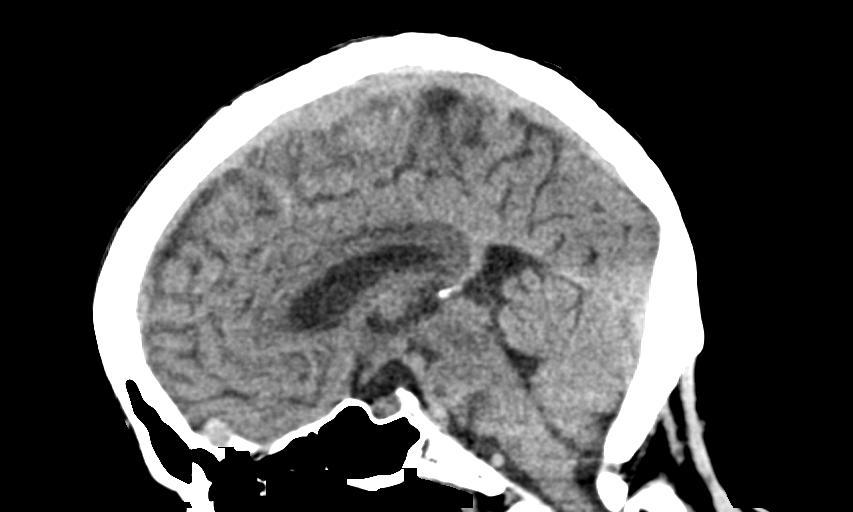
[im 45/67  brain]
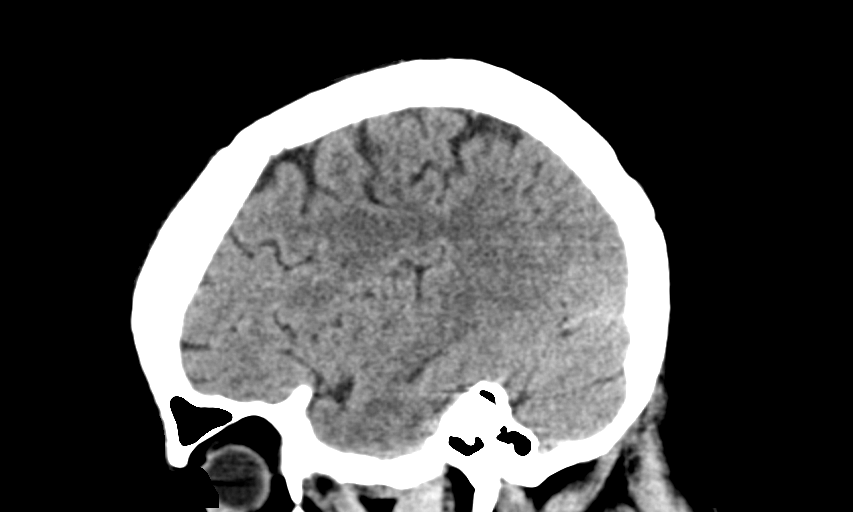

[14 of 47 positions shown; findings below may reference images not displayed]

FINDINGS: Brain: No evidence of acute infarction, hemorrhage, hydrocephalus,
extra-axial collection or mass lesion/mass effect. Mild right
frontal periventricular white matter low attenuation likely related
to microvascular disease.

Vascular: No hyperdense vessel or unexpected calcification.

Skull: No osseous abnormality.

Sinuses/Orbits: Visualized paranasal sinuses are clear. Visualized
mastoid sinuses are clear. Visualized orbits demonstrate no focal
abnormality.

Other: None
IMPRESSION: No acute intracranial pathology.

## 2019-03-01 ENCOUNTER — Ambulatory Visit (INDEPENDENT_AMBULATORY_CARE_PROVIDER_SITE_OTHER): Payer: Medicare Other | Admitting: Primary Care

## 2019-03-01 ENCOUNTER — Ambulatory Visit: Payer: Medicare Other | Admitting: Primary Care

## 2019-03-01 ENCOUNTER — Ambulatory Visit: Payer: Medicare Other | Admitting: Emergency Medicine

## 2019-03-01 ENCOUNTER — Encounter: Payer: Self-pay | Admitting: Primary Care

## 2019-03-01 ENCOUNTER — Other Ambulatory Visit: Payer: Self-pay

## 2019-03-01 DIAGNOSIS — J449 Chronic obstructive pulmonary disease, unspecified: Secondary | ICD-10-CM | POA: Diagnosis not present

## 2019-03-01 MED ORDER — AZITHROMYCIN 250 MG PO TABS: ORAL_TABLET | ORAL | 0 refills | Status: DC

## 2019-03-01 MED ORDER — IPRATROPIUM-ALBUTEROL 0.5-2.5 (3) MG/3ML IN SOLN: 3.0000 mL | Freq: Four times a day (QID) | RESPIRATORY_TRACT | 3 refills | Status: DC | PRN

## 2019-03-01 NOTE — Progress Notes (Signed)
Virtual Visit via Telephone Note  I connected with Megan [name] on 03/01/19 at 10:30 AM EDT by telephone and verified that I am speaking with the correct person using two identifiers.  Location: Patient: Megan [name] Provider: Dr. Mackey Birchwood, NP   I discussed the limitations, risks, security and privacy concerns of performing an evaluation and management service by telephone and the availability of in person appointments. I also discussed with the patient that there may be a patient responsible charge related to this service. The patient expressed understanding and agreed to proceed.   History of Present Illness: 68 year female, former smoker quit 2001. PMH COPD, chronic respiratory failure with hypoxia (oxygen dependent), allergic rhinitis, esophageal reflex. Patient of Dr. Lamonte Sakai, last seen on 01/18/19.  Maintained on Trelegy and low dose prednisone. Trilogy ventilator at night.  03/01/2019  Patient called today for 1 month follow-up. States that she is doing well. Feels daily prednisone has been helping. Reports that she had to take an extra half tab for 1-2 days which helped get her over a "hump". She continues to have some congestion, taking mucinex twice daily. Using Trelegy inhaler daily as prescribed. States current nebulizer machine is broken/not working. She has used duonebs in the past and would like a refill. Wearing Trilogy ventilator most night, nasal congestion does prevent her from wearing it at times. Feels stuffed up, she has been using flonase nasal spray. Asking to have zpack on hand for COPD flare.   Observations/Objective:  - No shortness of breath, wheezing or cough observed during phone conversation  Assessment and Plan:  COPD:  - Stable; improved with daily prednisone  - Continue TRELEGY 1 puff daily - RX Duoneb every 6 hours for shortness of breath/wheezing  - Continue prednisone 10mg  daily (call if needs additional prednisone taper) - Zpack to have on  hand for COPD exacerbation - Needs new nebulizer machine   Chronic respiratory failure: - Continue Oxygen 2.5-3.5 L - Make sure to wear Trilogy ventilatory every night   Allergic rhinitis: - Continue flonase nasal spray - Add saline rinses   Follow Up Instructions:  - 2 months with NP - 4 months with Dr. Lamonte Sakai   I discussed the assessment and treatment plan with the patient. The patient was provided an opportunity to ask questions and all were answered. The patient agreed with the plan and demonstrated an understanding of the instructions.   The patient was advised to call back or seek an in-person evaluation if the symptoms worsen or if the condition fails to improve as anticipated.  I provided 25 minutes of non-face-to-face time during this encounter.   Martyn Ehrich, NP

## 2019-03-01 NOTE — Patient Instructions (Addendum)
COPD: - Continue Trelegy inhaler 1 puff daily - Use combivent inhaler or duoneb every 6 hours for shortness of breath/wheezing  - Continue prednisone 10mg  daily (call office if you need additional prednisone taper) - Zpack to have on hand for COPD exacerbation, no refills (please let our office know if you need to take and no improvement in symptoms)  Chronic respiratory failure: - Continue Oxygen 2.5-3.5 L - Make sure to wear Trilogy ventilatory every night   Orders: - Needs new nebulizer machine   Follow-up: - 2 months with NP - 4 months with Dr. Lamonte Sakai    Chronic Obstructive Pulmonary Disease Chronic obstructive pulmonary disease (COPD) is a long-term (chronic) lung problem. When you have COPD, it is hard for air to get in and out of your lungs. Usually the condition gets worse over time, and your lungs will never return to normal. There are things you can do to keep yourself as healthy as possible.  Your doctor may treat your condition with: ? Medicines. ? Oxygen. ? Lung surgery.  Your doctor may also recommend: ? Rehabilitation. This includes steps to make your body work better. It may involve a team of specialists. ? Quitting smoking, if you smoke. ? Exercise and changes to your diet. ? Comfort measures (palliative care). Follow these instructions at home: Medicines  Take over-the-counter and prescription medicines only as told by your doctor.  Talk to your doctor before taking any cough or allergy medicines. You may need to avoid medicines that cause your lungs to be dry. Lifestyle  If you smoke, stop. Smoking makes the problem worse. If you need help quitting, ask your doctor.  Avoid being around things that make your breathing worse. This may include smoke, chemicals, and fumes.  Stay active, but remember to rest as well.  Learn and use tips on how to relax.  Make sure you get enough sleep. Most adults need at least 7 hours of sleep every night.  Eat healthy  foods. Eat smaller meals more often. Rest before meals. Controlled breathing Learn and use tips on how to control your breathing as told by your doctor. Try:  Breathing in (inhaling) through your nose for 1 second. Then, pucker your lips and breath out (exhale) through your lips for 2 seconds.  Putting one hand on your belly (abdomen). Breathe in slowly through your nose for 1 second. Your hand on your belly should move out. Pucker your lips and breathe out slowly through your lips. Your hand on your belly should move in as you breathe out.  Controlled coughing Learn and use controlled coughing to clear mucus from your lungs. Follow these steps: 1. Lean your head a little forward. 2. Breathe in deeply. 3. Try to hold your breath for 3 seconds. 4. Keep your mouth slightly open while coughing 2 times. 5. Spit any mucus out into a tissue. 6. Rest and do the steps again 1 or 2 times as needed. General instructions  Make sure you get all the shots (vaccines) that your doctor recommends. Ask your doctor about a flu shot and a pneumonia shot.  Use oxygen therapy and pulmonary rehabilitation if told by your doctor. If you need home oxygen therapy, ask your doctor if you should buy a tool to measure your oxygen level (oximeter).  Make a COPD action plan with your doctor. This helps you to know what to do if you feel worse than usual.  Manage any other conditions you have as told by your doctor.  Avoid going outside when it is very hot, cold, or humid.  Avoid people who have a sickness you can catch (contagious).  Keep all follow-up visits as told by your doctor. This is important. Contact a doctor if:  You cough up more mucus than usual.  There is a change in the color or thickness of the mucus.  It is harder to breathe than usual.  Your breathing is faster than usual.  You have trouble sleeping.  You need to use your medicines more often than usual.  You have trouble doing your  normal activities such as getting dressed or walking around the house. Get help right away if:  You have shortness of breath while resting.  You have shortness of breath that stops you from: ? Being able to talk. ? Doing normal activities.  Your chest hurts for longer than 5 minutes.  Your skin color is more blue than usual.  Your pulse oximeter shows that you have low oxygen for longer than 5 minutes.  You have a fever.  You feel too tired to breathe normally. Summary  Chronic obstructive pulmonary disease (COPD) is a long-term lung problem.  The way your lungs work will never return to normal. Usually the condition gets worse over time. There are things you can do to keep yourself as healthy as possible.  Take over-the-counter and prescription medicines only as told by your doctor.  If you smoke, stop. Smoking makes the problem worse. This information is not intended to replace advice given to you by your health care provider. Make sure you discuss any questions you have with your health care provider. Document Released: 04/05/2008 Document Revised: 11/22/2016 Document Reviewed: 11/22/2016 Elsevier Interactive Patient Education  2019 [employer].

## 2019-03-05 ENCOUNTER — Telehealth: Payer: Self-pay | Admitting: Primary Care

## 2019-03-05 DIAGNOSIS — J449 Chronic obstructive pulmonary disease, unspecified: Secondary | ICD-10-CM

## 2019-03-05 NOTE — Telephone Encounter (Signed)
Called and spoke with patient regarding nebulizer machine Pt had televisit with [facility] on 03-01-19 needing new nebulizer machine and supplies as pt's is over 5yrs old The order was sent to [facility] on 03-01-19 for new machine; Pt rec'd two nebulizer kits in the mail today no new nebulizer machine  Called and spoke with devita with [facility] at phone [phone] Ext [phone] She states that the order did not come through to her dept on 03-01-19 Placed new nebulizer machine and supply order today Per her supervisor, Elberta Fortis he states this was a shipping error Will be corrected today, having new nebulizer machine sent to pt this week. She stated that she rec'd it, and will have it out to the patient as soon as possible  LVM for pt with above info.X1

## 2019-03-06 NOTE — Telephone Encounter (Signed)
Spoke with patient and made aware that her nebulizer would be shipped out this week. Per Elberta Fortis at [facility] it was a shipping error. Kelli placed a new nebulizer order and left patient a message in which she did not listen to. She is appreciated. Informed patient to contact office if she doesn't receive nebulizer. Nothing further needed.

## 2019-03-07 ENCOUNTER — Other Ambulatory Visit: Payer: Self-pay

## 2019-03-07 MED ORDER — IPRATROPIUM-ALBUTEROL 0.5-2.5 (3) MG/3ML IN SOLN: 3.0000 mL | Freq: Four times a day (QID) | RESPIRATORY_TRACT | 3 refills | Status: DC | PRN

## 2019-03-08 ENCOUNTER — Other Ambulatory Visit: Payer: Self-pay

## 2019-03-08 MED ORDER — IPRATROPIUM-ALBUTEROL 0.5-2.5 (3) MG/3ML IN SOLN: 3.0000 mL | Freq: Four times a day (QID) | RESPIRATORY_TRACT | 3 refills | Status: DC | PRN

## 2019-03-12 ENCOUNTER — Telehealth: Payer: Self-pay | Admitting: Pulmonary Disease

## 2019-03-12 NOTE — Telephone Encounter (Signed)
Medication name and strength: Ipratropium-Albuterol .05-2.5 (3mg ) Provider: PM Pharmacy: [facility] Patient insurance ID: [id number] Phone: [phone]  Was the PA started on CMM?  Yes 03/12/19 If yes, please enter the Key:  [id number] - Rx #: [id number]  Need help? Call us at [phone]    Outcome  Additional Information Required [facility] was not able to process the request because the previous Prior Authorization Request was Denied, please contact the plan at [phone] or fax in request to [phone].

## 2019-03-12 NOTE — Telephone Encounter (Signed)
Talked with Maggie with [facility] at [phone] She advised this medication doesn't need PA, it is approved for one year Pt has medicaid, this is approved Nothing further needed.

## 2019-03-19 ENCOUNTER — Other Ambulatory Visit: Payer: Self-pay | Admitting: Internal Medicine

## 2019-04-04 ENCOUNTER — Other Ambulatory Visit: Payer: Self-pay | Admitting: *Deleted

## 2019-04-04 MED ORDER — DILTIAZEM HCL ER COATED BEADS 240 MG PO CP24: 240.0000 mg | ORAL_CAPSULE | Freq: Every day | ORAL | 0 refills | Status: DC

## 2019-04-12 ENCOUNTER — Telehealth: Payer: Self-pay | Admitting: Emergency Medicine

## 2019-04-12 MED ORDER — PREDNISONE 10 MG PO TABS: ORAL_TABLET | ORAL | 0 refills | Status: DC

## 2019-04-12 MED ORDER — DOXYCYCLINE HYCLATE 100 MG PO TABS: 100.0000 mg | ORAL_TABLET | Freq: Two times a day (BID) | ORAL | 0 refills | Status: DC

## 2019-04-12 NOTE — Telephone Encounter (Signed)
Please ask her to take doxycycline 100mg  bid x 7 days Prednisone taper > Take 40mg  daily for 3 days, then 30mg  daily for 3 days, then 20mg  daily for 3 days, then 10mg  daily for 3 days, then stop Call us top let us know how she is doing on the meds.

## 2019-04-12 NOTE — Telephone Encounter (Signed)
Called and spoke with pt who stated she has had increased SOB and tightness x2 days. Pt also has complaints of mild wheezing. Pt also has a productive cough with yellow phlegm. Pt denies any complaints of fever but states that she has had some chills.  Pt has also had some headaches and has been taking tylenol which has not helped the headache fully go away so she believes she may need something stronger to help with her headaches.  Pt has been taking mucinex now x2-3 days and has been doing breathing treatments. Pt is also taking loratadine and doing flonase nasal spray to help with her allergies.  Due to pt's symptoms, pt is wanting to know if an abx and prednisone Rx could be prescribed to help with pt's symptoms. Dr. Lamonte Sakai, please advise on this for pt. Thanks!

## 2019-04-12 NOTE — Telephone Encounter (Signed)
Called and spoke with pt letting her know that we were going to send in doxycycline abx and pred taper Rxs to pharmacy for her. Stated to pt to call us next week to let us know how she has been doing after being on both meds and pt stated that she would.  Pt asked if we could prescribed fiorcet to help with her headache and I stated to pt to call PCP and they should be able to take care of that Rx for her and pt verbalized understanding. Nothing further needed.

## 2019-04-16 IMAGING — CR DG CHEST 1V PORT
2 series · 2 of 2 positions shown · non-contrast
Comparison: 02/24/2017 .

CLINICAL DATA: Respiratory failure.

EXAM:
PORTABLE CHEST 1 VIEW

[AP (1 of 2)]
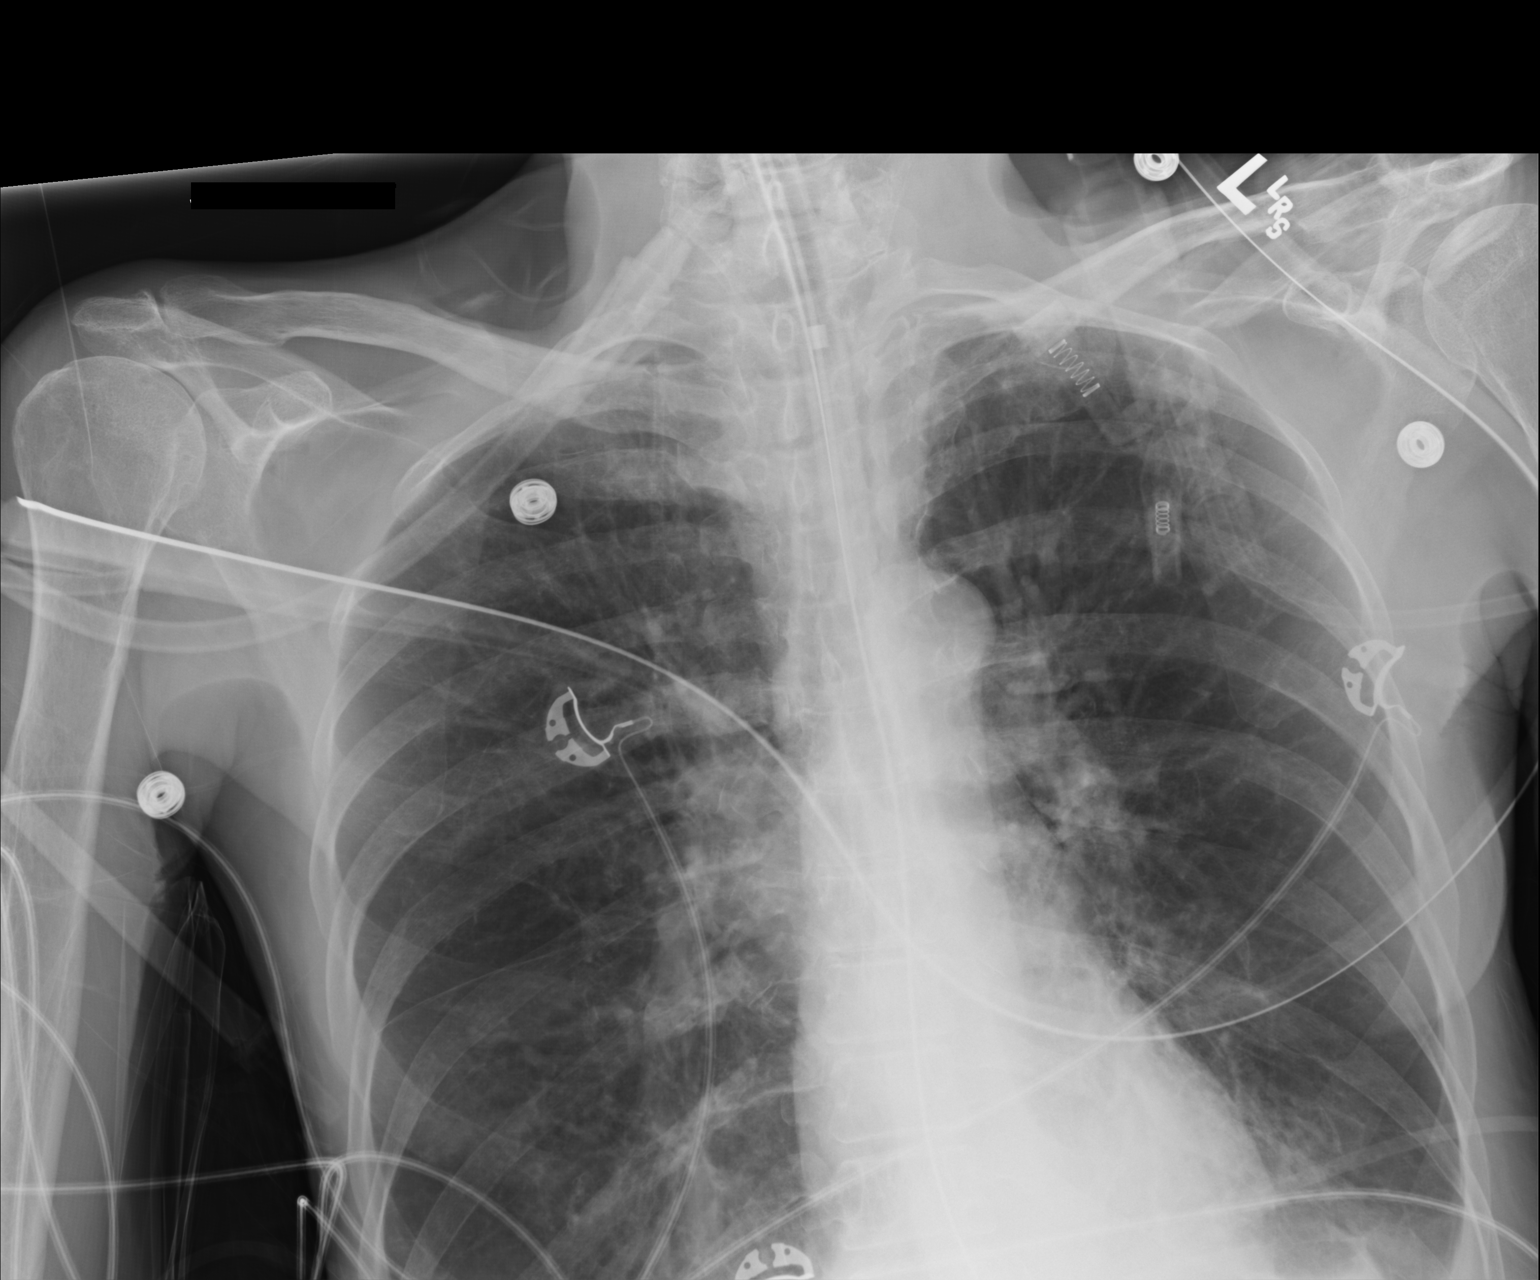

[AP (2 of 2)]
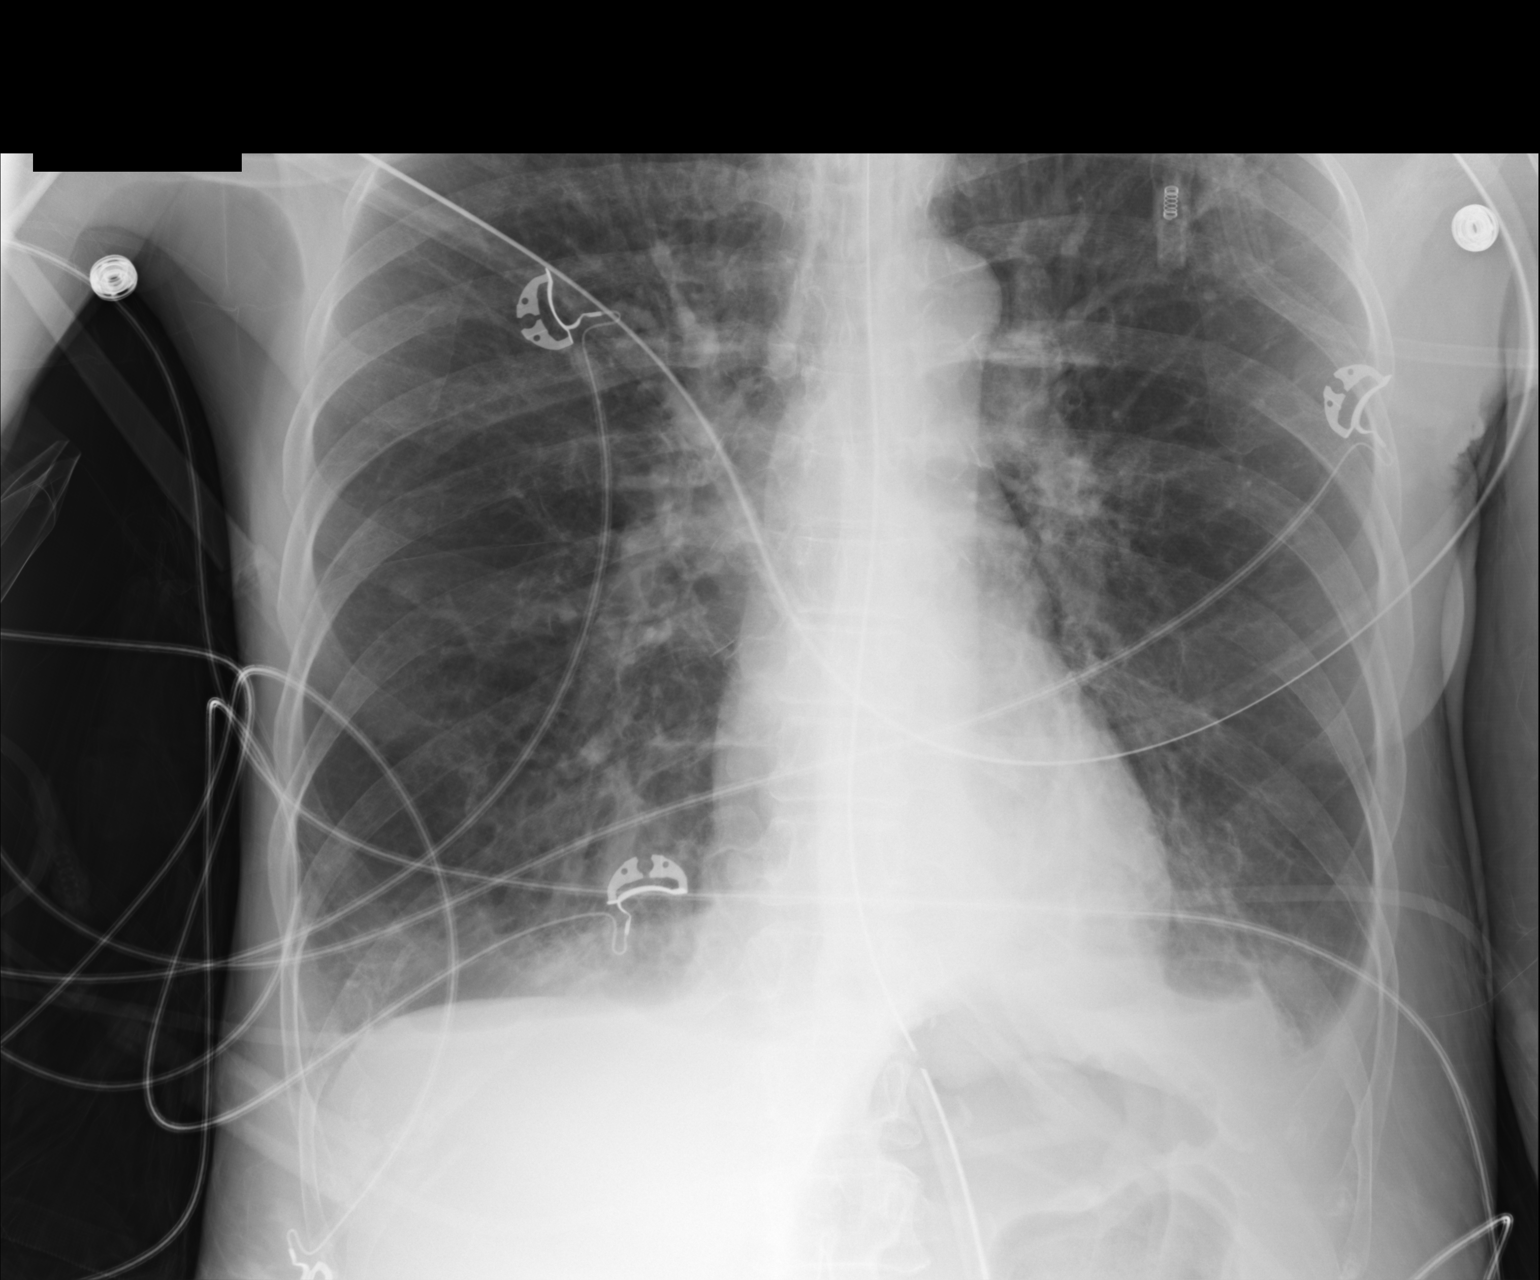

[2 of 2 positions shown; findings below may reference images not displayed]

FINDINGS: Endotracheal tube and NG tube in stable position. Heart size normal.
COPD . Bibasilar pulmonary infiltrates again noted. Slight
improvement. Bilateral small pleural effusions again noted. No
pneumothorax.
IMPRESSION: 1. Lines and tubes in stable position.

2. Slight improvement of bibasilar pulmonary infiltrates. Small
pleural effusions again noted. COPD.

## 2019-04-19 ENCOUNTER — Encounter: Payer: Self-pay | Admitting: Internal Medicine

## 2019-04-19 ENCOUNTER — Ambulatory Visit (INDEPENDENT_AMBULATORY_CARE_PROVIDER_SITE_OTHER): Payer: Medicare Other | Admitting: Internal Medicine

## 2019-04-19 ENCOUNTER — Other Ambulatory Visit: Payer: Self-pay

## 2019-04-19 ENCOUNTER — Other Ambulatory Visit (INDEPENDENT_AMBULATORY_CARE_PROVIDER_SITE_OTHER): Payer: Medicare Other

## 2019-04-19 VITALS — BP 122/78 | HR 89 | Temp 98.3°F | Ht 62.0 in | Wt 93.0 lb

## 2019-04-19 DIAGNOSIS — I10 Essential (primary) hypertension: Secondary | ICD-10-CM | POA: Diagnosis not present

## 2019-04-19 DIAGNOSIS — E538 Deficiency of other specified B group vitamins: Secondary | ICD-10-CM | POA: Diagnosis not present

## 2019-04-19 DIAGNOSIS — J441 Chronic obstructive pulmonary disease with (acute) exacerbation: Secondary | ICD-10-CM | POA: Diagnosis not present

## 2019-04-19 DIAGNOSIS — D649 Anemia, unspecified: Secondary | ICD-10-CM | POA: Diagnosis not present

## 2019-04-19 DIAGNOSIS — E611 Iron deficiency: Secondary | ICD-10-CM | POA: Diagnosis not present

## 2019-04-19 DIAGNOSIS — Z23 Encounter for immunization: Secondary | ICD-10-CM

## 2019-04-19 DIAGNOSIS — E559 Vitamin D deficiency, unspecified: Secondary | ICD-10-CM

## 2019-04-19 DIAGNOSIS — R739 Hyperglycemia, unspecified: Secondary | ICD-10-CM | POA: Diagnosis not present

## 2019-04-19 LAB — URINALYSIS, ROUTINE W REFLEX MICROSCOPIC
Bilirubin Urine: NEGATIVE
Hgb urine dipstick: NEGATIVE
Ketones, ur: NEGATIVE
Leukocytes,Ua: NEGATIVE
Nitrite: NEGATIVE
RBC / HPF: NONE SEEN (ref 0–?)
Specific Gravity, Urine: >=1.03 — AB (ref 1.000–1.030)
Urine Glucose: NEGATIVE
Urobilinogen, UA: 0.2 (ref 0.0–1.0)
pH: 5 (ref 5.0–8.0)

## 2019-04-19 LAB — HEPATIC FUNCTION PANEL
ALT: 13 U/L (ref 0–35)
AST: 15 U/L (ref 0–37)
Albumin: 4.2 g/dL (ref 3.5–5.2)
Alkaline Phosphatase: 62 U/L (ref 39–117)
Bilirubin, Direct: 0 mg/dL (ref 0.0–0.3)
Total Bilirubin: 0.4 mg/dL (ref 0.2–1.2)
Total Protein: 6.7 g/dL (ref 6.0–8.3)

## 2019-04-19 LAB — BASIC METABOLIC PANEL
BUN: 14 mg/dL (ref 6–23)
CO2: 41 mEq/L — ABNORMAL HIGH (ref 19–32)
Calcium: 9.1 mg/dL (ref 8.4–10.5)
Chloride: 91 mEq/L — ABNORMAL LOW (ref 96–112)
Creatinine, Ser: 0.76 mg/dL (ref 0.40–1.20)
GFR: 76.09 mL/min (ref >60.00)
Glucose, Bld: 157 mg/dL — ABNORMAL HIGH (ref 70–99)
Potassium: 4.7 mEq/L (ref 3.5–5.1)
Sodium: 137 mEq/L (ref 135–145)

## 2019-04-19 LAB — IBC PANEL
Iron: 103 ug/dL (ref 42–145)
Saturation Ratios: 20.1 % (ref 20.0–50.0)
Transferrin: 366 mg/dL — ABNORMAL HIGH (ref 212.0–360.0)

## 2019-04-19 LAB — CBC WITH DIFFERENTIAL/PLATELET
Basophils Absolute: 0.1 10*3/uL (ref 0.0–0.1)
Basophils Relative: 0.8 % (ref 0.0–3.0)
Eosinophils Absolute: 0 10*3/uL (ref 0.0–0.7)
Eosinophils Relative: 0.1 % (ref 0.0–5.0)
HCT: 36.8 % (ref 36.0–46.0)
Hemoglobin: 11.5 g/dL — ABNORMAL LOW (ref 12.0–15.0)
Lymphocytes Relative: 5.3 % — ABNORMAL LOW (ref 12.0–46.0)
Lymphs Abs: 0.7 10*3/uL (ref 0.7–4.0)
MCHC: 31.3 g/dL (ref 30.0–36.0)
MCV: 93.6 fl (ref 78.0–100.0)
Monocytes Absolute: 0.1 10*3/uL (ref 0.1–1.0)
Monocytes Relative: 0.8 % — ABNORMAL LOW (ref 3.0–12.0)
Neutro Abs: 11.5 10*3/uL — ABNORMAL HIGH (ref 1.4–7.7)
Neutrophils Relative %: 93 % — ABNORMAL HIGH (ref 43.0–77.0)
Platelets: 436 10*3/uL — ABNORMAL HIGH (ref 150.0–400.0)
RBC: 3.93 Mil/uL (ref 3.87–5.11)
RDW: 14.4 % (ref 11.5–15.5)
WBC: 12.4 10*3/uL — ABNORMAL HIGH (ref 4.0–10.5)

## 2019-04-19 LAB — LIPID PANEL
Cholesterol: 241 mg/dL — ABNORMAL HIGH (ref 0–200)
HDL: 98.9 mg/dL (ref >39.00)
LDL Cholesterol: 113 mg/dL — ABNORMAL HIGH (ref 0–99)
NonHDL: 142.34
Total CHOL/HDL Ratio: 2
Triglycerides: 145 mg/dL (ref 0.0–149.0)
VLDL: 29 mg/dL (ref 0.0–40.0)

## 2019-04-19 LAB — VITAMIN D 25 HYDROXY (VIT D DEFICIENCY, FRACTURES): VITD: 20.31 ng/mL — ABNORMAL LOW (ref 30.00–100.00)

## 2019-04-19 LAB — TSH: TSH: 0.28 u[IU]/mL — ABNORMAL LOW (ref 0.35–4.50)

## 2019-04-19 LAB — VITAMIN B12: Vitamin B-12: 505 pg/mL (ref 211–911)

## 2019-04-19 LAB — HEMOGLOBIN A1C: Hgb A1c MFr Bld: 6.4 % (ref 4.6–6.5)

## 2019-04-19 MED ORDER — METHYLPREDNISOLONE ACETATE 80 MG/ML IJ SUSP
80.0000 mg | Freq: Once | INTRAMUSCULAR | Status: AC
  Administered 2019-04-19: 80 mg via INTRAMUSCULAR

## 2019-04-19 MED ORDER — PREDNISONE 10 MG PO TABS: ORAL_TABLET | ORAL | 0 refills | Status: DC

## 2019-04-19 MED ORDER — TIZANIDINE HCL 2 MG PO TABS: 2.0000 mg | ORAL_TABLET | Freq: Four times a day (QID) | ORAL | 3 refills | Status: DC | PRN

## 2019-04-19 NOTE — Patient Instructions (Addendum)
You had the Pneumovax pneumonia shot today  You had the steroid shot today  Please take all new medication as prescribed - the prednisone, and the muscle relaxer as needed  Please continue all other medications as before, and refills have been done if requested.  Please have the pharmacy call with any other refills you may need.  Please continue your efforts at being more active, low cholesterol diet, and weight control.  You are otherwise up to date with prevention measures today.  Please keep your appointments with your specialists as you may have planned  Please go to the LAB in the Basement (turn left off the elevator) for the tests to be done today  You will be contacted by phone if any changes need to be made immediately.  Otherwise, you will receive a letter about your results with an explanation, but please check with MyChart first.  Please remember to sign up for MyChart if you have not done so, as this will be important to you in the future with finding out test results, communicating by private email, and scheduling acute appointments online when needed.  Please return in 6 months, or sooner if needed

## 2019-04-19 NOTE — Progress Notes (Signed)
Subjective:    Patient ID: Megan [name], female    DOB: [birth year], 68 y.o.   MRN: [id number]  HPI  Here to f/u; overall doing ok,  Pt denies chest pain orthopnea, PND, increased LE swelling, palpitations, dizziness or syncope, but has had 3 days onset mild worsening non prod cough, wheezing, sob/doe without fever or colored sputum, and no blood.  Pt denies new neurological symptoms such as new headache, or facial or extremity weakness or numbness.  Pt denies polydipsia, polyuria, or low sugar episode.  Pt states overall good compliance with meds, mostly trying to follow appropriate diet, with wt overall stable.  Pt continues to have recurring LBP and overall muscular pain, without bowel or bladder change, fever, wt loss,  worsening LE pain/numbness/weakness, gait change or falls. Past Medical History:  Diagnosis Date   Anemia    Anxiety state 08/20/2015   CAP (community acquired pneumonia)    CHF (congestive heart failure) ([place])    COPD (chronic obstructive pulmonary disease) ([place])    Depression    Essential hypertension 08/19/2015   GERD (gastroesophageal reflux disease)    Headache    History of hiatal hernia    Myocardial infarction (HCC)    Shortness of breath dyspnea    Past Surgical History:  Procedure Laterality Date   CARDIAC CATHETERIZATION     CARDIAC CATHETERIZATION N/A 08/26/2015   Procedure: Left Heart Cath and Coronary Angiography;  Surgeon: Jettie Booze, MD;  Location: [place] [facility];  Service: Cardiovascular;  Laterality: N/A;   ESOPHAGOGASTRODUODENOSCOPY N/A 04/18/2018   Procedure: ESOPHAGOGASTRODUODENOSCOPY (EGD);  Surgeon: Ladene Artist, MD;  Location: [facility] ENDOSCOPY;  Service: Endoscopy;  Laterality: N/A;   tracheostomy      reports that she quit smoking about 18 years ago. Her smoking use included cigarettes. She has a 30.00 pack-year smoking history. She has never used smokeless tobacco. She reports that she does not drink alcohol or use  drugs. family history includes Cirrhosis in her mother; Depression in her mother; Heart disease in her father. Allergies  Allergen Reactions   Penicillins Rash and Hives    Has patient had a PCN reaction causing immediate rash, facial/tongue/throat swelling, SOB or lightheadedness with hypotension: No Has patient had a PCN reaction causing severe rash involving mucus membranes or skin necrosis:NO Has patient had a PCN reaction that required hospitalization No Has patient had a PCN reaction occurring within the last 10 years:NO If all of the above answers are "NO", then may proceed with Cephalosporin use.   Current Outpatient Medications on File Prior to Visit  Medication Sig Dispense Refill   ALPRAZolam (XANAX) 0.25 MG tablet 1 tab by mouth twice per day as needed 60 tablet 2   aspirin EC 81 MG tablet Take 81 mg by mouth daily.     azithromycin (ZITHROMAX) 250 MG tablet Take as directed for COPD exacerbation (ICD code- J 44.9) 6 tablet 0   butalbital-acetaminophen-caffeine (FIORICET, ESGIC) 50-325-40 MG tablet Take 1 tablet by mouth 2 (two) times daily as needed for headache. 30 tablet 0   diltiazem (CARDIZEM CD) 240 MG 24 hr capsule Take 1 capsule (240 mg total) by mouth daily. Needs visit for future refills. 30 capsule 0   doxycycline (VIBRA-TABS) 100 MG tablet Take 1 tablet (100 mg total) by mouth 2 (two) times daily. 14 tablet 0   feeding supplement, ENSURE ENLIVE, (ENSURE ENLIVE) LIQD Take 237 mLs by mouth 4 (four) times daily. 237 mL 0   fluticasone (FLONASE)  50 MCG/ACT nasal spray Place 2 sprays into both nostrils daily. 16 g 5   HYDROcodone-acetaminophen (NORCO) 7.5-325 MG tablet Take 1 tablet by mouth every 6 (six) hours as needed for moderate pain. 60 tablet 0   ipratropium-albuterol (DUONEB) 0.5-2.5 (3) MG/3ML SOLN Take 3 mLs by nebulization every 6 (six) hours as needed. 360 mL 3   lidocaine (LIDODERM) 5 % Place 1 patch onto the skin daily. Remove & Discard patch within  12 hours or as directed by MD 10 patch 0   loratadine (CLARITIN) 10 MG tablet TAKE 1 TABLET BY MOUTH DAILY 90 tablet 3   metoprolol tartrate (LOPRESSOR) 25 MG tablet TAKE 1/2 TABLET(12.5 MG) BY MOUTH TWICE DAILY 90 tablet 0   montelukast (SINGULAIR) 10 MG tablet Take 1 tablet (10 mg total) by mouth at bedtime. 30 tablet 11   Multiple Vitamin (TAB-A-VITE/BETA CAROTENE) TABS Take 1 tablet by mouth daily.     pantoprazole (PROTONIX) 40 MG tablet Take 1 tablet (40 mg total) by mouth daily. Follow-up appt is due must see provider for future refills 30 tablet 0   polyethylene glycol (MIRALAX / GLYCOLAX) packet Take 17 g by mouth 2 (two) times daily. (Patient taking differently: Take 17 g by mouth daily as needed for mild constipation. ) 14 each 0   PROVENTIL HFA 108 (90 Base) MCG/ACT inhaler INHALE 2 PUFFS INTO THE LUNGS EVERY 6 HOURS AS NEEDED FOR WHEEZING OR SHORTNESS OF BREATH 6.7 g 5   senna-docusate (SENOKOT-S) 8.6-50 MG tablet Take 1 tablet by mouth 2 (two) times daily. (Patient taking differently: Take 1 tablet by mouth at bedtime as needed for mild constipation. ) 120 tablet 1   sertraline (ZOLOFT) 50 MG tablet Take 1 tablet (50 mg total) by mouth daily. (Patient taking differently: Take 50 mg by mouth daily as needed (for anxiety). ) 30 tablet 2   TRELEGY ELLIPTA 100-62.5-25 MCG/INH AEPB INHALE 1 PUFF INTO THE LUNGS DAILY (Patient taking differently: Inhale 1 puff into the lungs daily. ) 60 each 5   No current facility-administered medications on file prior to visit.    Review of Systems  Constitutional: Negative for other unusual diaphoresis or sweats HENT: Negative for ear discharge or swelling Eyes: Negative for other worsening visual disturbances Respiratory: Negative for stridor or other swelling  Gastrointestinal: Negative for worsening distension or other blood Genitourinary: Negative for retention or other urinary change Musculoskeletal: Negative for other MSK pain or  swelling Skin: Negative for color change or other new lesions Neurological: Negative for worsening tremors and other numbness  Psychiatric/Behavioral: Negative for worsening agitation or other fatigue All other system neg per pt    Objective:   Physical Exam BP 122/78    Pulse 89    Temp 98.3 F (36.8 C) (Oral)    Ht 5\' 2"  (1.575 m)    Wt 93 lb (42.2 kg)    SpO2 98%    BMI 17.01 kg/m  VS noted, non toxic Constitutional: Pt appears in NAD HENT: Head: NCAT.  Right Ear: External ear normal.  Left Ear: External ear normal.  Eyes: . Pupils are equal, round, and reactive to light. Conjunctivae and EOM are normal Nose: without d/c or deformity Neck: Neck supple. Gross normal ROM Cardiovascular: Normal rate and regular rhythm.   Pulmonary/Chest: Effort normal and breath sounds decreased without rales but with few wheezing.  Abd:  Soft, NT, ND, + BS, no organomegaly Neurological: Pt is alert. At baseline orientation, motor grossly intact Skin: Skin is  warm. No rashes, other new lesions, no LE edema Psychiatric: Pt behavior is normal without agitation  No other exam findings Lab Results  Component Value Date   WBC 12.4 (H) 04/19/2019   HGB 11.5 (L) 04/19/2019   HCT 36.8 04/19/2019   PLT 436.0 (H) 04/19/2019   GLUCOSE 157 (H) 04/19/2019   CHOL 241 (H) 04/19/2019   TRIG 145.0 04/19/2019   HDL 98.90 04/19/2019   LDLCALC 113 (H) 04/19/2019   ALT 13 04/19/2019   AST 15 04/19/2019   NA 137 04/19/2019   K 4.7 04/19/2019   CL 91 (L) 04/19/2019   CREATININE 0.76 04/19/2019   BUN 14 04/19/2019   CO2 41 (H) 04/19/2019   TSH 0.28 (L) 04/19/2019   INR 1.05 03/09/2017   HGBA1C 6.4 04/19/2019          Assessment & Plan:

## 2019-04-20 ENCOUNTER — Other Ambulatory Visit: Payer: Self-pay | Admitting: Internal Medicine

## 2019-04-20 ENCOUNTER — Telehealth: Payer: Self-pay

## 2019-04-20 DIAGNOSIS — E0590 Thyrotoxicosis, unspecified without thyrotoxic crisis or storm: Secondary | ICD-10-CM

## 2019-04-20 MED ORDER — VITAMIN D (ERGOCALCIFEROL) 1.25 MG (50000 UNIT) PO CAPS: 50000.0000 [IU] | ORAL_CAPSULE | ORAL | 0 refills | Status: DC

## 2019-04-20 NOTE — Telephone Encounter (Signed)
-----   Message from [name], MD sent at 04/20/2019 12:27 PM EDT ----- Left message on MyChart, pt to cont same tx except  he test results show that your current treatment is OK, except the Vitamin D level is low, and the thyroid test indicates a mild overactive thyroid when you are not taking medication for thyroid  We need to: 1)  Please take Vitamin D 50000 units weekly for 12 weeks, then plan to change to OTC Vitamin D3 at 2000 units per day, indefinitely. 2)  Refer Endocrinology for overactive thyroid  Breeze Angell to please inform pt, I will do rx and referral

## 2019-04-20 NOTE — Telephone Encounter (Signed)
Pt has viewed results via MyChart  

## 2019-04-21 ENCOUNTER — Encounter: Payer: Self-pay | Admitting: Internal Medicine

## 2019-04-21 NOTE — Assessment & Plan Note (Addendum)
Mild, no fever, for depomedrol IM 80, predpac asd, declines cxr, cont inhalers,  to f/u any worsening symptoms or concerns

## 2019-04-21 NOTE — Assessment & Plan Note (Signed)
stable overall by history and exam, recent data reviewed with pt, and pt to continue medical treatment as before,  to f/u any worsening symptoms or concerns  

## 2019-04-21 NOTE — Assessment & Plan Note (Signed)
No overt bleeding, for f/u cbc with labs,  to f/u any worsening symptoms or concerns

## 2019-04-23 ENCOUNTER — Other Ambulatory Visit: Payer: Self-pay

## 2019-04-23 MED ORDER — PANTOPRAZOLE SODIUM 40 MG PO TBEC: 40.0000 mg | DELAYED_RELEASE_TABLET | Freq: Every day | ORAL | 1 refills | Status: DC

## 2019-05-02 ENCOUNTER — Ambulatory Visit: Payer: Medicare Other | Admitting: Primary Care

## 2019-05-14 ENCOUNTER — Ambulatory Visit: Payer: Medicare Other | Admitting: Primary Care

## 2019-05-14 NOTE — Progress Notes (Deleted)
@Patient  ID: Megan [name], female    DOB: [birth year], 67 y.o.   MRN: [id number]  No chief complaint on file.   Referring provider: [name], MD  HPI: 68 year female, former smoker quit 2001. PMH COPD, chronic respiratory failure with hypoxia (oxygen dependent), allergic rhinitis, esophageal reflex. Patient of Dr. Lamonte Sakai. Maintained on Trelegy and low dose prednisone. Trilogy ventilator at night.  Previous [place] pulmonary encounter: 03/01/2019  Patient called today for 1 month follow-up. States that she is doing well. Feels daily prednisone has been helping. Reports that she had to take an extra half tab for 1-2 days which helped get her over a "hump". She continues to have some congestion, taking mucinex twice daily. Using Trelegy inhaler daily as prescribed. States current nebulizer machine is broken/not working. She has used duonebs in the past and would like a refill. Wearing Trilogy ventilator most night, nasal congestion does prevent her from wearing it at times. Feels stuffed up, she has been using flonase nasal spray. Asking to have zpack on hand for COPD flare.   05/14/2019 Patient presents today for 3 month follow-up.    Allergies  Allergen Reactions  . Penicillins Rash and Hives    Has patient had a PCN reaction causing immediate rash, facial/tongue/throat swelling, SOB or lightheadedness with hypotension: No Has patient had a PCN reaction causing severe rash involving mucus membranes or skin necrosis:NO Has patient had a PCN reaction that required hospitalization No Has patient had a PCN reaction occurring within the last 10 years:NO If all of the above answers are "NO", then may proceed with Cephalosporin use.    Immunization History  Administered Date(s) Administered  . HiB (PRP-OMP) 09/10/2015  . Influenza, High Dose Seasonal PF 08/27/2016, 07/25/2018  . Influenza,inj,Quad PF,6+ Mos 09/16/2017  . Pneumococcal Conjugate-13 09/16/2017  . Pneumococcal  Polysaccharide-23 04/19/2019  . Tdap 09/16/2017    Past Medical History:  Diagnosis Date  . Anemia   . Anxiety state 08/20/2015  . CAP (community acquired pneumonia)   . CHF (congestive heart failure) ([place])   . COPD (chronic obstructive pulmonary disease) ([place])   . Depression   . Essential hypertension 08/19/2015  . GERD (gastroesophageal reflux disease)   . Headache   . History of hiatal hernia   . Myocardial infarction ([place])   . Shortness of breath dyspnea     Tobacco History: Social History   Tobacco Use  Smoking Status Former Smoker  . Packs/day: 1.00  . Years: 30.00  . Pack years: 30.00  . Types: Cigarettes  . Quit date: 08/18/2000  . Years since quitting: 18.7  Smokeless Tobacco Never Used   Counseling given: Not Answered   Outpatient Medications Prior to Visit  Medication Sig Dispense Refill  . ALPRAZolam (XANAX) 0.25 MG tablet 1 tab by mouth twice per day as needed 60 tablet 2  . aspirin EC 81 MG tablet Take 81 mg by mouth daily.    Marland Kitchen azithromycin (ZITHROMAX) 250 MG tablet Take as directed for COPD exacerbation (ICD code- J 44.9) 6 tablet 0  . butalbital-acetaminophen-caffeine (FIORICET, ESGIC) 50-325-40 MG tablet Take 1 tablet by mouth 2 (two) times daily as needed for headache. 30 tablet 0  . diltiazem (CARDIZEM CD) 240 MG 24 hr capsule Take 1 capsule (240 mg total) by mouth daily. Needs visit for future refills. 30 capsule 0  . doxycycline (VIBRA-TABS) 100 MG tablet Take 1 tablet (100 mg total) by mouth 2 (two) times daily. 14 tablet 0  . feeding  supplement, ENSURE ENLIVE, (ENSURE ENLIVE) LIQD Take 237 mLs by mouth 4 (four) times daily. 237 mL 0  . fluticasone (FLONASE) 50 MCG/ACT nasal spray Place 2 sprays into both nostrils daily. 16 g 5  . HYDROcodone-acetaminophen (NORCO) 7.5-325 MG tablet Take 1 tablet by mouth every 6 (six) hours as needed for moderate pain. 60 tablet 0  . ipratropium-albuterol (DUONEB) 0.5-2.5 (3) MG/3ML SOLN Take 3 mLs by nebulization  every 6 (six) hours as needed. 360 mL 3  . lidocaine (LIDODERM) 5 % Place 1 patch onto the skin daily. Remove & Discard patch within 12 hours or as directed by MD 10 patch 0  . loratadine (CLARITIN) 10 MG tablet TAKE 1 TABLET BY MOUTH DAILY 90 tablet 3  . metoprolol tartrate (LOPRESSOR) 25 MG tablet TAKE 1/2 TABLET(12.5 MG) BY MOUTH TWICE DAILY 90 tablet 0  . montelukast (SINGULAIR) 10 MG tablet Take 1 tablet (10 mg total) by mouth at bedtime. 30 tablet 11  . Multiple Vitamin (TAB-A-VITE/BETA CAROTENE) TABS Take 1 tablet by mouth daily.    . pantoprazole (PROTONIX) 40 MG tablet Take 1 tablet (40 mg total) by mouth daily. 90 tablet 1  . polyethylene glycol (MIRALAX / GLYCOLAX) packet Take 17 g by mouth 2 (two) times daily. (Patient taking differently: Take 17 g by mouth daily as needed for mild constipation. ) 14 each 0  . predniSONE (DELTASONE) 10 MG tablet 3 tabs by mouth per day for 3 days,2tabs per day for 3 days,1tab per day for 3 days 18 tablet 0  . PROVENTIL HFA 108 (90 Base) MCG/ACT inhaler INHALE 2 PUFFS INTO THE LUNGS EVERY 6 HOURS AS NEEDED FOR WHEEZING OR SHORTNESS OF BREATH 6.7 g 5  . senna-docusate (SENOKOT-S) 8.6-50 MG tablet Take 1 tablet by mouth 2 (two) times daily. (Patient taking differently: Take 1 tablet by mouth at bedtime as needed for mild constipation. ) 120 tablet 1  . sertraline (ZOLOFT) 50 MG tablet Take 1 tablet (50 mg total) by mouth daily. (Patient taking differently: Take 50 mg by mouth daily as needed (for anxiety). ) 30 tablet 2  . tiZANidine (ZANAFLEX) 2 MG tablet Take 1 tablet (2 mg total) by mouth every 6 (six) hours as needed for muscle spasms. 60 tablet 3  . TRELEGY ELLIPTA 100-62.5-25 MCG/INH AEPB INHALE 1 PUFF INTO THE LUNGS DAILY (Patient taking differently: Inhale 1 puff into the lungs daily. ) 60 each 5  . Vitamin D, Ergocalciferol, (DRISDOL) 1.25 MG (50000 UT) CAPS capsule Take 1 capsule (50,000 Units total) by mouth every 7 (seven) days. 12 capsule 0   No  facility-administered medications prior to visit.       Review of Systems  Review of Systems   Physical Exam  There were no vitals taken for this visit. Physical Exam   Lab Results:  CBC    Component Value Date/Time   WBC 12.4 (H) 04/19/2019 1537   RBC 3.93 04/19/2019 1537   HGB 11.5 (L) 04/19/2019 1537   HCT 36.8 04/19/2019 1537   PLT 436.0 (H) 04/19/2019 1537   MCV 93.6 04/19/2019 1537   MCH 29.0 11/08/2018 0240   MCHC 31.3 04/19/2019 1537   RDW 14.4 04/19/2019 1537   LYMPHSABS 0.7 04/19/2019 1537   MONOABS 0.1 04/19/2019 1537   EOSABS 0.0 04/19/2019 1537   BASOSABS 0.1 04/19/2019 1537    BMET    Component Value Date/Time   NA 137 04/19/2019 1537   K 4.7 04/19/2019 1537   CL 91 (L)  04/19/2019 1537   CO2 41 (H) 04/19/2019 1537   GLUCOSE 157 (H) 04/19/2019 1537   BUN 14 04/19/2019 1537   CREATININE 0.76 04/19/2019 1537   CALCIUM 9.1 04/19/2019 1537   GFRNONAA >60 11/08/2018 0240   GFRAA >60 11/08/2018 0240    BNP    Component Value Date/Time   BNP 184.2 (H) 02/12/2018 2052    ProBNP No results found for: PROBNP  Imaging: No results found.   Assessment & Plan:   No problem-specific Assessment & Plan notes found for this encounter.     Martyn Ehrich, NP 05/14/2019

## 2019-05-15 ENCOUNTER — Other Ambulatory Visit: Payer: Self-pay | Admitting: Emergency Medicine

## 2019-05-21 ENCOUNTER — Telehealth: Payer: Self-pay | Admitting: Primary Care

## 2019-05-21 NOTE — Telephone Encounter (Signed)
Attempted to call pt but unable to reach. Left message for pt to return call. 

## 2019-05-22 NOTE — Telephone Encounter (Signed)
Attempted to call pt but line went straight to VM. Left message for pt to return call. 

## 2019-05-23 MED ORDER — PREDNISONE 10 MG PO TABS: 10.0000 mg | ORAL_TABLET | Freq: Every day | ORAL | 0 refills | Status: DC

## 2019-05-23 MED ORDER — TRELEGY ELLIPTA 100-62.5-25 MCG/INH IN AEPB: 1.0000 | INHALATION_SPRAY | Freq: Every day | RESPIRATORY_TRACT | 5 refills | Status: DC

## 2019-05-23 NOTE — Telephone Encounter (Signed)
LVMTCB x 1 for patient. 

## 2019-05-23 NOTE — Telephone Encounter (Signed)
Called patient: she has a f/u scheduled with RB on 06/12/19. Pt needs her Prednisone 10 mg refilled in which someone has d/c'd. Pt also needs refill on Trelegy. Prednisone 10 mg daily re-entered.   Assessment and Plan:  COPD:  - Stable; improved with daily prednisone  - Continue TRELEGY 1 puff daily - RX Duoneb every 6 hours for shortness of breath/wheezing  - Continue prednisone 10mg  daily (call if needs additional prednisone taper) - Zpack to have on hand for COPD exacerbation - Needs new nebulizer machine   Chronic respiratory failure: - Continue Oxygen 2.5-3.5 L - Make sure to wear Trilogy ventilatory every night   Allergic rhinitis: - Continue flonase nasal spray - Add saline rinses   Follow Up Instructions:  - 2 months with NP - 4 months with Dr. Lamonte Sakai

## 2019-05-23 NOTE — Telephone Encounter (Signed)
PT CALLING BACK A/B REFILL ON PREDINSONE, PLEASE ADVISE SHE CAN BE REACHED @ [phone].Megan [name]

## 2019-05-23 NOTE — Telephone Encounter (Signed)
LMTCB

## 2019-05-23 NOTE — Telephone Encounter (Signed)
PT SAYS IT IS OK TO LEAVE MSG.Megan [name]

## 2019-05-23 NOTE — Telephone Encounter (Signed)
Yes continue prednisone 10mg  daily until follow-up with Dr. Lamonte Sakai. May consider tapering dose off at that visit. Refill sent.

## 2019-05-24 NOTE — Telephone Encounter (Signed)
Called pt and advised message from the provider. Pt understood and verbalized understanding. Nothing further is needed.    

## 2019-06-08 ENCOUNTER — Telehealth: Payer: Self-pay | Admitting: Internal Medicine

## 2019-06-08 DIAGNOSIS — F4312 Post-traumatic stress disorder, chronic: Secondary | ICD-10-CM

## 2019-06-08 MED ORDER — ALPRAZOLAM 0.25 MG PO TABS: ORAL_TABLET | ORAL | 2 refills | Status: DC

## 2019-06-08 NOTE — Telephone Encounter (Signed)
Done erx 

## 2019-06-12 ENCOUNTER — Ambulatory Visit (INDEPENDENT_AMBULATORY_CARE_PROVIDER_SITE_OTHER): Payer: Medicare Other | Admitting: Primary Care

## 2019-06-12 ENCOUNTER — Other Ambulatory Visit: Payer: Self-pay

## 2019-06-12 ENCOUNTER — Encounter: Payer: Self-pay | Admitting: Primary Care

## 2019-06-12 ENCOUNTER — Ambulatory Visit: Payer: Medicare Other | Admitting: Emergency Medicine

## 2019-06-12 DIAGNOSIS — J449 Chronic obstructive pulmonary disease, unspecified: Secondary | ICD-10-CM | POA: Diagnosis not present

## 2019-06-12 MED ORDER — AZELASTINE HCL 0.1 % NA SOLN: 1.0000 | Freq: Two times a day (BID) | NASAL | 6 refills | Status: DC

## 2019-06-12 NOTE — Patient Instructions (Addendum)
  Continue Trelegy 1 puff daily Albuterol 2 puffs every 6 hours as needed for sob/wheezing Restart Singulair and Claritin daily Use flonase daily (IF nasal passages are dry/irritated take a break from flonase) Start azelastine nasal spray Use ocean nasal spray before medicated nasl spray  Continue oxygen 2-4 L to keep O2 >88%  Wear Trilogy ventilator every night while sleeping  Consider Daliresp - will discuss with Dr. Lamonte Sakai d/t weight   Follow-up: 3-4 months with Dr. Lamonte Sakai  Check with PCP about coupons for nutrition/protein shakes

## 2019-06-12 NOTE — Progress Notes (Signed)
Virtual Visit via Telephone Note  I connected with Megan [name] on 06/12/19 at  2:30 PM EDT by telephone and verified that I am speaking with the correct person using two identifiers.  Location: Patient: Home Provider: Office   I discussed the limitations, risks, security and privacy concerns of performing an evaluation and management service by telephone and the availability of in person appointments. I also discussed with the patient that there may be a patient responsible charge related to this service. The patient expressed understanding and agreed to proceed.   History of Present Illness: 68 year female, former smoker quit 2001. PMH COPD severe, chronic respiratory failure with hypoxia (oxygen dependent), past tracheostomy in 2018, allergic rhinitis, esophageal reflex, diastolic CHF, secondary pulmonary hypertension. Patient of Dr. Lamonte Sakai, last seen on 01/18/19.  Maintained on Trelegy and low dose prednisone. Trilogy ventilator at night.  03/01/2019  Patient called today for 1 month follow-up. States that she is doing well. Feels daily prednisone has been helping. Reports that she had to take an extra half tab for 1-2 days which helped get her over a "hump". She continues to have some congestion, taking mucinex twice daily. Using Trelegy inhaler daily as prescribed. States current nebulizer machine is broken/not working. She has used duonebs in the past and would like a refill. Wearing Trilogy ventilator most night, nasal congestion does prevent her from wearing it at times. Feels stuffed up, she has been using flonase nasal spray. Asking to have zpack on hand for COPD flare. Continue prednisone 10mg  daily until follow-up with Dr. Lamonte Sakai. May consider tapering dose off at that visit.    04/12/19- Telephone call Treated for COPD exacerbation with doxycycline and prednisone taper   05/14/19 No showed appointment  06/12/2019 Patient presents today for follow-up visit. Doing well overall. Feels  like daily prednisone has been helping her breathing. She has not been hospitalized since Jan 2020. States that she has required a couple prednisone tapers since March 2020. She is a little short of breath with weather changes and heat/humidity. Reports post nasal drip and nasal dryness. She has been using flonase daily. Continue Trelegy 1 puff daily. Wears Trilogy ventilator at night. Using 2-2.5L oxygen continuously. Denies current wheezing or cough.    Observations/Objective:  Oxygen 92-94% on 2-2.5L  Assessment and Plan:  COPD, severe - PFTs in 2016 showed FEV1 0.51 (24.9%), F/F 34, DLCO 10.45 (56%) - Continue Trelegy 1 puff daily - Use Albuterol hfa or duoneb every 6 hours as needed for sob/wheezing - Continue prednisone 10mg  daily until next follow up with Dr. Brock Ra, unlikely she will be able to come off chronic steroids d/t severity of her COPD (I believe she has been on this dose since March 2020) - We could consider Daliresp but I am concern with her being underweight and side effects  - If able may also want to repeat PFTs in the next 6 months   Chronic respiratory failure - Continue 2-4L to keep O2 > 88% - Continue Trilogy ventilator every night  Allergic rhinitis/ PND - Continue ocean nasal spray twice daily (use prior to medicated sprays) - Continue flonase 1 spray per nostril daily  - Add azelastine 1 spray per nostril twice daily    Follow Up Instructions:   - 3 months with Dr. Lamonte Sakai  I discussed the assessment and treatment plan with the patient. The patient was provided an opportunity to ask questions and all were answered. The patient agreed with the plan and demonstrated an understanding  of the instructions.   The patient was advised to call back or seek an in-person evaluation if the symptoms worsen or if the condition fails to improve as anticipated.  I provided 30 minutes of non-face-to-face time during this encounter.   Martyn Ehrich, NP

## 2019-06-14 ENCOUNTER — Telehealth: Payer: Self-pay | Admitting: Internal Medicine

## 2019-06-14 NOTE — Telephone Encounter (Signed)
Medication Refill - Medication: diltiazem (CARDIZEM CD) 240 MG 24 hr capsule  Has the patient contacted their pharmacy? Yes - states that they have been trying to get a response from [place] for 2 weeks and haven't heard anything (Agent: If no, request that the patient contact the pharmacy for the refill.) (Agent: If yes, when and what did the pharmacy advise?)  Preferred Pharmacy (with phone number or street name):  [id number] - [place], [place] [phone] (Phone) [phone] (Fax)    Agent: Please be advised that RX refills may take up to 3 business days. We ask that you follow-up with your pharmacy.

## 2019-06-15 MED ORDER — DILTIAZEM HCL ER COATED BEADS 240 MG PO CP24: 240.0000 mg | ORAL_CAPSULE | Freq: Every day | ORAL | 1 refills | Status: DC

## 2019-06-20 ENCOUNTER — Other Ambulatory Visit: Payer: Self-pay | Admitting: *Deleted

## 2019-06-20 DIAGNOSIS — J449 Chronic obstructive pulmonary disease, unspecified: Secondary | ICD-10-CM

## 2019-07-02 ENCOUNTER — Other Ambulatory Visit: Payer: Self-pay | Admitting: Emergency Medicine

## 2019-07-07 ENCOUNTER — Other Ambulatory Visit: Payer: Self-pay | Admitting: Primary Care

## 2019-07-07 ENCOUNTER — Telehealth: Payer: Self-pay | Admitting: Pulmonary Disease

## 2019-07-07 MED ORDER — PREDNISONE 10 MG PO TABS: 10.0000 mg | ORAL_TABLET | Freq: Every day | ORAL | 3 refills | Status: DC

## 2019-07-07 NOTE — Telephone Encounter (Signed)
Pt called as she has run out of prednisone. Refill prescription sent to pharmacy.

## 2019-07-18 ENCOUNTER — Telehealth: Payer: Self-pay | Admitting: Emergency Medicine

## 2019-07-18 DIAGNOSIS — J449 Chronic obstructive pulmonary disease, unspecified: Secondary | ICD-10-CM

## 2019-07-18 MED ORDER — AZITHROMYCIN 250 MG PO TABS: ORAL_TABLET | ORAL | 0 refills | Status: DC

## 2019-07-18 NOTE — Telephone Encounter (Signed)
OK to call azithromycin Z-pack.  She needs to call us if not improving - the sx sounds viral, and she will be at risk for exacerbation of COPD

## 2019-07-18 NOTE — Telephone Encounter (Signed)
Primary Pulmonologist: [place] Last office visit and with whom: 8.11.2020 w/ Beth NP What do we see them for (pulmonary problems): emphysema Last OV assessment/plan:  Assessment and Plan: COPD, severe - PFTs in 2016 showed FEV1 0.51 (24.9%), F/F 34, DLCO 10.45 (56%) - Continue Trelegy 1 puff daily - Use Albuterol hfa or duoneb every 6 hours as needed for sob/wheezing - Continue prednisone 10mg  daily until next follow up with Dr. Brock Ra, unlikely she will be able to come off chronic steroids d/t severity of her COPD (I believe she has been on this dose since March 2020) - We could consider Daliresp but I am concern with her being underweight and side effects  - If able may also want to repeat PFTs in the next 6 months    Chronic respiratory failure - Continue 2-4L to keep O2 > 88% - Continue Trilogy ventilator every night   Allergic rhinitis/ PND - Continue ocean nasal spray twice daily (use prior to medicated sprays) - Continue flonase 1 spray per nostril daily  - Add azelastine 1 spray per nostril twice daily   Was appointment offered to patient (explain)?  Yes, pt declined   Reason for call: symptoms began 2 days ago, worse yesterday.  Runny nose (clear/runny), head congestion, PND, some slight left ear congestion, tight around the ribs, chest congestion, more work to cough, mucus is "different than it was; not enough to scare me but enough to make me think it's time for an antibiotic", fatigue.  Some chills, but she does not have a thermometer.    Patient stated that she is staying pretty well isolated (does not go out except to doctor's appts.  Grandchildren do live in her home and home health aide).  She is requesting abx instead of office visit at this time.  Dr Lamonte Sakai please advise, thank you.

## 2019-07-18 NOTE — Telephone Encounter (Signed)
Patient declined appointment.

## 2019-07-18 NOTE — Telephone Encounter (Signed)
Called the patient and advised of prescription being sent to pharmacy and to call our office if her symptoms do not improve.  Prescription sent to pharmacy. Nothing further needed.

## 2019-07-25 ENCOUNTER — Other Ambulatory Visit: Payer: Self-pay | Admitting: Emergency Medicine

## 2019-07-31 ENCOUNTER — Telehealth: Payer: Self-pay | Admitting: Emergency Medicine

## 2019-07-31 NOTE — Telephone Encounter (Signed)
I tried calling pt again but there was no answer.

## 2019-07-31 NOTE — Telephone Encounter (Signed)
Yes she can increase to 40mg  x 3 days; then 30mg  x 3 days; 20mg  x 3 days then stay on 10mg    Send in refill if she needed

## 2019-07-31 NOTE — Telephone Encounter (Signed)
ATC pt, no answer. Left message for pt to call back.  

## 2019-07-31 NOTE — Telephone Encounter (Signed)
Spoke with pt, she states her lungs burn and she feels like she is at risk for exacerbations. She is more SOB and has increased mucus from her chest. She doesn't want to go to the hospital due to Covid and prefers to prevent a hospital admission.. She takes 10 mg of Prednisone and would like to increase the dosage until she comes in on Friday 10/2 to see Beth. She doesn't have any other symptoms.I offered her an earlier appt and she states they only have one vehicle but she would check with her daughter. Beth please advise if you are ok with increasing prednisone until her appt.        Patient Instructions by Martyn Ehrich, NP at 06/12/2019 2:30 PM Author: Martyn Ehrich, NP Author Type: Nurse Practitioner Filed: 06/12/2019 5:59 PM  Note Status: Addendum Cosign: Cosign Not Required Encounter Date: 06/12/2019  Editor: Martyn Ehrich, NP (Nurse Practitioner)  Prior Versions: 1. Martyn Ehrich, NP (Nurse Practitioner) at 06/12/2019 5:58 PM - Addendum   2. Martyn Ehrich, NP (Nurse Practitioner) at 06/12/2019 3:18 PM - Addendum   3. Martyn Ehrich, NP (Nurse Practitioner) at 06/12/2019 3:14 PM - Addendum   4. Martyn Ehrich, NP (Nurse Practitioner) at 06/12/2019 3:06 PM - Addendum   5. Martyn Ehrich, NP (Nurse Practitioner) at 06/12/2019 3:02 PM - Addendum   6. Martyn Ehrich, NP (Nurse Practitioner) at 06/12/2019 2:57 PM - Signed     Continue Trelegy 1 puff daily Albuterol 2 puffs every 6 hours as needed for sob/wheezing Restart Singulair and Claritin daily Use flonase daily (IF nasal passages are dry/irritated take a break from flonase) Start azelastine nasal spray Use ocean nasal spray before medicated nasl spray  Continue oxygen 2-4 L to keep O2 >88%  Wear Trilogy ventilator every night while sleeping  Consider Daliresp - will discuss with Dr. Lamonte Sakai d/t weight   Follow-up: 3-4 months with Dr. Lamonte Sakai  Check with PCP about coupons for  nutrition/protein shakes

## 2019-08-01 ENCOUNTER — Ambulatory Visit: Payer: Medicare Other | Admitting: Primary Care

## 2019-08-01 MED ORDER — PREDNISONE 10 MG PO TABS: ORAL_TABLET | ORAL | 0 refills | Status: DC

## 2019-08-01 NOTE — Telephone Encounter (Signed)
Called spoke with patient, advised may increase prednisone as documented by [facility] NP.  Rx sent to verified pharmacy as requested by patient.  Nothing further needed; will sign off.

## 2019-08-01 NOTE — Telephone Encounter (Signed)
Pt returning call.  [phone].

## 2019-08-03 ENCOUNTER — Ambulatory Visit: Payer: Medicare Other | Admitting: Primary Care

## 2019-08-06 ENCOUNTER — Ambulatory Visit: Payer: Medicare Other | Admitting: Primary Care

## 2019-08-07 ENCOUNTER — Encounter: Payer: Self-pay | Admitting: Primary Care

## 2019-08-07 ENCOUNTER — Other Ambulatory Visit: Payer: Self-pay

## 2019-08-07 ENCOUNTER — Ambulatory Visit (INDEPENDENT_AMBULATORY_CARE_PROVIDER_SITE_OTHER): Payer: Medicare Other | Admitting: Primary Care

## 2019-08-07 VITALS — BP 146/90 | HR 105 | Temp 97.2°F | Ht 62.5 in | Wt 89.8 lb

## 2019-08-07 DIAGNOSIS — J9612 Chronic respiratory failure with hypercapnia: Secondary | ICD-10-CM

## 2019-08-07 DIAGNOSIS — J449 Chronic obstructive pulmonary disease, unspecified: Secondary | ICD-10-CM

## 2019-08-07 DIAGNOSIS — B351 Tinea unguium: Secondary | ICD-10-CM

## 2019-08-07 DIAGNOSIS — L609 Nail disorder, unspecified: Secondary | ICD-10-CM | POA: Diagnosis not present

## 2019-08-07 DIAGNOSIS — J9611 Chronic respiratory failure with hypoxia: Secondary | ICD-10-CM

## 2019-08-07 DIAGNOSIS — Z23 Encounter for immunization: Secondary | ICD-10-CM | POA: Diagnosis not present

## 2019-08-07 MED ORDER — GUAIFENESIN ER 600 MG PO TB12: 1200.0000 mg | ORAL_TABLET | Freq: Two times a day (BID) | ORAL | 1 refills | Status: DC

## 2019-08-07 NOTE — Assessment & Plan Note (Signed)
-   Re-qualified for home oxygen - Needs 2-2.5L on exertion to keep O2 > 88-90%

## 2019-08-07 NOTE — Progress Notes (Signed)
@Patient  ID: Megan [name], female    DOB: 1951/09/29, 68 y.o.   MRN: [id number]  Chief Complaint  Patient presents with  . Follow-up    Referring provider: [name], MD  HPI: 68 year female, former smoker quit 2001. PMH COPD severe, chronic respiratory failure with hypoxia (oxygen dependent), past tracheostomy in 2018, allergic rhinitis, esophageal reflex, diastolic CHF, secondary pulmonary hypertension. Patient of Dr. Lamonte Sakai.  Maintained on Trelegy and low dose prednisone. Trilogy ventilator at night.  Previous [facility] pulmonary Encounter: 03/01/2019  Patient called today for 1 month follow-up. States that she is doing well. Feels daily prednisone has been helping. Reports that she had to take an extra half tab for 1-2 days which helped get her over a "hump". She continues to have some congestion, taking mucinex twice daily. Using Trelegy inhaler daily as prescribed. States current nebulizer machine is broken/not working. She has used duonebs in the past and would like a refill. Wearing Trilogy ventilator most night, nasal congestion does prevent her from wearing it at times. Feels stuffed up, she has been using flonase nasal spray. Asking to have zpack on hand for COPD flare. Continue prednisone 10mg  daily until follow-up with Dr. Lamonte Sakai. May consider tapering dose off at that visit.    06/12/2019 Patient presents today for follow-up visit. Doing well overall. Feels like daily prednisone has been helping her breathing. She has not been hospitalized since Jan 2020. States that she has required a couple prednisone tapers since March 2020. She is a little short of breath with weather changes and heat/humidity. Reports post nasal drip and nasal dryness. She has been using flonase daily. Continue Trelegy 1 puff daily. Wears Trilogy ventilator at night. Using 2-2.5L oxygen continuously. Denies current wheezing or cough.   08/07/2019 Patient presents today to re-qualify for oxygen. Treated for COPD  exacerbation in mid-late September with Zpack and prednisone taper, states that she is feeling better except for chronic fatigue. Breathing is at baseline. No significant cough. Continues trelegy as prescribed and Duonebs q 6 hours prn. She cares for young children at home, trying to stat active as best she can. Afebrtile.    Allergies  Allergen Reactions  . Penicillins Rash and Hives    Has patient had a PCN reaction causing immediate rash, facial/tongue/throat swelling, SOB or lightheadedness with hypotension: No Has patient had a PCN reaction causing severe rash involving mucus membranes or skin necrosis:NO Has patient had a PCN reaction that required hospitalization No Has patient had a PCN reaction occurring within the last 10 years:NO If all of the above answers are "NO", then may proceed with Cephalosporin use.    Immunization History  Administered Date(s) Administered  . Fluad Quad(high Dose 65+) 08/07/2019  . HiB (PRP-OMP) 09/10/2015  . Influenza, High Dose Seasonal PF 08/27/2016, 07/25/2018  . Influenza,inj,Quad PF,6+ Mos 09/16/2017  . Pneumococcal Conjugate-13 09/16/2017  . Pneumococcal Polysaccharide-23 04/19/2019  . Tdap 09/16/2017    Past Medical History:  Diagnosis Date  . Anemia   . Anxiety state 08/20/2015  . CAP (community acquired pneumonia)   . CHF (congestive heart failure) ([place])   . COPD (chronic obstructive pulmonary disease) ([place])   . Depression   . Essential hypertension 08/19/2015  . GERD (gastroesophageal reflux disease)   . Headache   . History of hiatal hernia   . Myocardial infarction ([place])   . Shortness of breath dyspnea     Tobacco History: Social History   Tobacco Use  Smoking Status Former Smoker  .  Packs/day: 1.00  . Years: 30.00  . Pack years: 30.00  . Types: Cigarettes  . Quit date: 08/18/2000  . Years since quitting: 18.9  Smokeless Tobacco Never Used   Counseling given: Not Answered   Outpatient Medications Prior to Visit   Medication Sig Dispense Refill  . ALPRAZolam (XANAX) 0.25 MG tablet 1 tab by mouth twice per day as needed 60 tablet 2  . aspirin EC 81 MG tablet Take 81 mg by mouth daily.    Marland Kitchen azelastine (ASTELIN) 0.1 % nasal spray Place 1 spray into both nostrils 2 (two) times daily. Use in each nostril as directed 30 mL 6  . butalbital-acetaminophen-caffeine (FIORICET, ESGIC) 50-325-40 MG tablet Take 1 tablet by mouth 2 (two) times daily as needed for headache. 30 tablet 0  . diltiazem (CARDIZEM CD) 240 MG 24 hr capsule Take 1 capsule (240 mg total) by mouth daily. 90 capsule 1  . feeding supplement, ENSURE ENLIVE, (ENSURE ENLIVE) LIQD Take 237 mLs by mouth 4 (four) times daily. 237 mL 0  . fluticasone (FLONASE) 50 MCG/ACT nasal spray SHAKE LIQUID AND USE 2 SPRAYS IN EACH NOSTRIL DAILY 16 g 5  . Fluticasone-Umeclidin-Vilant (TRELEGY ELLIPTA) 100-62.5-25 MCG/INH AEPB Inhale 1 puff into the lungs daily. 60 each 5  . ipratropium-albuterol (DUONEB) 0.5-2.5 (3) MG/3ML SOLN Take 3 mLs by nebulization every 6 (six) hours as needed. 360 mL 3  . lidocaine (LIDODERM) 5 % Place 1 patch onto the skin daily. Remove & Discard patch within 12 hours or as directed by MD 10 patch 0  . loratadine (CLARITIN) 10 MG tablet TAKE 1 TABLET BY MOUTH DAILY 90 tablet 3  . metoprolol tartrate (LOPRESSOR) 25 MG tablet TAKE 1/2 TABLET(12.5 MG) BY MOUTH TWICE DAILY 90 tablet 0  . montelukast (SINGULAIR) 10 MG tablet Take 1 tablet (10 mg total) by mouth at bedtime. 30 tablet 11  . Multiple Vitamin (TAB-A-VITE/BETA CAROTENE) TABS Take 1 tablet by mouth daily.    . pantoprazole (PROTONIX) 40 MG tablet Take 1 tablet (40 mg total) by mouth daily. 90 tablet 1  . polyethylene glycol (MIRALAX / GLYCOLAX) packet Take 17 g by mouth 2 (two) times daily. (Patient taking differently: Take 17 g by mouth daily as needed for mild constipation. ) 14 each 0  . predniSONE (DELTASONE) 10 MG tablet Take 1 tablet (10 mg total) by mouth daily with breakfast. 30  tablet 3  . predniSONE (DELTASONE) 10 MG tablet Take 4 tabs once daily x3 days, then 3 tabs for 3 days, 2 tabs for 3 days then resume regular 10mg  daily dose 27 tablet 0  . senna-docusate (SENOKOT-S) 8.6-50 MG tablet Take 1 tablet by mouth 2 (two) times daily. (Patient taking differently: Take 1 tablet by mouth at bedtime as needed for mild constipation. ) 120 tablet 1  . tiZANidine (ZANAFLEX) 2 MG tablet Take 1 tablet (2 mg total) by mouth every 6 (six) hours as needed for muscle spasms. 60 tablet 3  . VENTOLIN HFA 108 (90 Base) MCG/ACT inhaler INHALE 2 PUFFS BY MOUTH EVERY 6 HOURS AS NEEDED FOR WHEEZING OR SHORTNESS OF BREATH 18 g 5  . Vitamin D, Ergocalciferol, (DRISDOL) 1.25 MG (50000 UT) CAPS capsule Take 1 capsule (50,000 Units total) by mouth every 7 (seven) days. 12 capsule 0  . azithromycin (ZITHROMAX) 250 MG tablet Take 2 tablets x 1 day, and 1 tablet x 4 days then stop. 6 each 0   No facility-administered medications prior to visit.    Review  of Systems  Review of Systems  Constitutional: Positive for fatigue.  HENT: Negative.   Respiratory: Negative for cough, shortness of breath and wheezing.   Cardiovascular: Negative.    Physical Exam  BP (!) 146/90 (BP Location: Left Arm, Cuff Size: Normal)   Pulse (!) 105   Temp (!) 97.2 F (36.2 C) (Oral)   Ht 5' 2.5" (1.588 m)   Wt 89 lb 12.8 oz (40.7 kg)   SpO2 99%   BMI 16.16 kg/m  Physical Exam Constitutional:      Appearance: Normal appearance.  HENT:     Head: Normocephalic and atraumatic.     Nose: Nose normal.     Mouth/Throat:     Mouth: Mucous membranes are moist.     Pharynx: Oropharynx is clear.  Neck:     Musculoskeletal: Normal range of motion and neck supple.  Cardiovascular:     Rate and Rhythm: Normal rate and regular rhythm.  Pulmonary:     Effort: Pulmonary effort is normal.     Breath sounds: Wheezing and rhonchi present.  Musculoskeletal: Normal range of motion.  Skin:    General: Skin is warm and  dry.     Comments: Nail changes (yellow, thick, brittle)  Neurological:     General: No focal deficit present.     Mental Status: She is alert and oriented to person, place, and time. Mental status is at baseline.  Psychiatric:        Mood and Affect: Mood normal.        Behavior: Behavior normal.        Thought Content: Thought content normal.        Judgment: Judgment normal.      Lab Results:  CBC    Component Value Date/Time   WBC 12.4 (H) 04/19/2019 1537   RBC 3.93 04/19/2019 1537   HGB 11.5 (L) 04/19/2019 1537   HCT 36.8 04/19/2019 1537   PLT 436.0 (H) 04/19/2019 1537   MCV 93.6 04/19/2019 1537   MCH 29.0 11/08/2018 0240   MCHC 31.3 04/19/2019 1537   RDW 14.4 04/19/2019 1537   LYMPHSABS 0.7 04/19/2019 1537   MONOABS 0.1 04/19/2019 1537   EOSABS 0.0 04/19/2019 1537   BASOSABS 0.1 04/19/2019 1537    BMET    Component Value Date/Time   NA 137 04/19/2019 1537   K 4.7 04/19/2019 1537   CL 91 (L) 04/19/2019 1537   CO2 41 (H) 04/19/2019 1537   GLUCOSE 157 (H) 04/19/2019 1537   BUN 14 04/19/2019 1537   CREATININE 0.76 04/19/2019 1537   CALCIUM 9.1 04/19/2019 1537   GFRNONAA >60 11/08/2018 0240   GFRAA >60 11/08/2018 0240    BNP    Component Value Date/Time   BNP 184.2 (H) 02/12/2018 2052    ProBNP No results found for: PROBNP  Imaging: No results found.   Assessment & Plan:   COPD (chronic obstructive pulmonary disease) ([place]) - Last exacerbation in September requiring Zpack and prednisone taper  - Doing well today, breathing baseline  - Continue Trelegy 1 puff daily - Use ipratropium nebulizer 2-3 times a day followed by flutter valve (5-10) - Take mucinex 1200mg  twice daily for 2 weeks  - Received high dose influenza vaccine today   Chronic respiratory failure with hypoxia and hypercapnia (HCC) - Re-qualified for home oxygen - Needs 2-2.5L on exertion to keep O2 > 88-90%  Nail abnormality - Refer to dermatology   Martyn Ehrich, NP  08/07/2019

## 2019-08-07 NOTE — Assessment & Plan Note (Addendum)
-   Last exacerbation in September requiring Zpack and prednisone taper  - Doing well today, breathing baseline  - Continue Trelegy 1 puff daily - Use ipratropium nebulizer 2-3 times a day followed by flutter valve (5-10) - Take mucinex 1200mg  twice daily for 2 weeks  - Received high dose influenza vaccine today

## 2019-08-07 NOTE — Assessment & Plan Note (Signed)
Refer to dermatology 

## 2019-08-07 NOTE — Patient Instructions (Addendum)
Recommendations: Continue Trelegy 1 puff daily Use nebulizer 2-3 times a day followed by flutter valve (5-10) Take mucinex 1200mg  twice daily for 2 weeks   Office treatment: High dose flu shot   Referral: Dermatology- fungal nail  Follow-up 3 months with Dr. Lamonte Sakai

## 2019-08-27 ENCOUNTER — Other Ambulatory Visit: Payer: Self-pay | Admitting: Primary Care

## 2019-09-06 ENCOUNTER — Telehealth: Payer: Self-pay | Admitting: Primary Care

## 2019-09-06 MED ORDER — PREDNISONE 10 MG PO TABS: 10.0000 mg | ORAL_TABLET | Freq: Every day | ORAL | 1 refills | Status: DC

## 2019-09-06 NOTE — Telephone Encounter (Signed)
Called and spoke with Patient. Patient requested a refill for Prednisone to be sent to requested [facility]. Patient is due for follow up appointment with Dr. Lamonte Sakai.  Patient stated she would call Friday or Monday to schedule office visit.  Prednisone refill sent to requested pharmacy. Nothing further at this time.

## 2019-10-16 ENCOUNTER — Telehealth: Payer: Self-pay | Admitting: Primary Care

## 2019-10-16 NOTE — Telephone Encounter (Signed)
Spoke with pt, states that she's been receiving calls from [place] stating that she needs new qualifying O2 sats.  Per chart this was just done on 08/07/2019.  Called [employer] and spoke to Merrilee Seashore, who advised that Medicare is requiring that she "start over" and requalify for O2.  I advised that we just qualified the pt and this should not be needed at this time.  After miscommunication and being transferred to another rep, I called Melissa to see what exactly was needed.  Melissa verified that the only reason the patient would need to requalify for O2 is if her insurance company has changed since October.  Melissa will look into this and call us back with an update.  Will await call back.

## 2019-10-17 NOTE — Telephone Encounter (Signed)
Attempted to call Melissa with [employer], no answer, left message to call back.

## 2019-10-17 NOTE — Telephone Encounter (Signed)
Melissa from [employer] - missing : when they qualified in October the Sat documentation is missing on room air - for the first one -   Please advise CB# [phone]

## 2019-10-18 NOTE — Telephone Encounter (Signed)
lmtcb for Melissa at [place]. Per pt's chart in [facility] note, it is noted that pt was walked on 08/07/2019:  Patient saturations at rest = 96% Patient Saturations on Room Air while Ambulating = 88% Patient Saturations on 2L Liters of continuous oxygen while Ambulating = 94%  Why would this not suffice as qualifying patient for supplemental O2?

## 2019-10-18 NOTE — Telephone Encounter (Signed)
Melissa from [employer] is returning call from someone in triage - CB# [phone]

## 2019-10-18 NOTE — Telephone Encounter (Signed)
Called spoke with Melissa who reported that Medicare requires saturations to always have a 'condition in which the sats were obtained' - ie on room air, O2 liter flow, etc.  Melissa asks if the 1st documented sat of 96% can be addended to include the condition.  This will FIRST need to be printed off, marked through and initialed and dated PRIOR TO changing it in epic because Medicare will dispute the first documentation they received dating 10/6 and the new documentation dated now.  Can be faxed to [facility] directly @ [phone].  Beth, are you okay with making this change?  Was the 96% on room air?  Thank you!  The [facility] note says: SATURATION QUALIFICATIONS: (This note is used to comply with regulatory documentation for home oxygen)  Patient saturations at rest = 96%  Patient Saturations on Room Air while Ambulating = 88%  Patient Saturations on 2L Liters of continuous oxygen while Ambulating = 94%  Please briefly explain why patient needs home oxygen: Pt oxygen drops while walking.   DME [employer]  08/07/2019 TA/CMA

## 2019-10-19 NOTE — Telephone Encounter (Signed)
Patient checking the status of the O2 order thru [place].  Patient phone number is [phone].

## 2019-10-19 NOTE — Telephone Encounter (Signed)
Called and spoke with pt letting her know that we were still working on getting all taken care of in regards to her O2 with [employer]. Pt verbalized understanding.   Beth, please advise on the message from [place]. Thanks!

## 2019-10-19 NOTE — Telephone Encounter (Signed)
Megan [name],  The documented 96% is in the [facility] note located in the snapshot.    I was able to locate the qualifying walk and it appears that patient was 96% on 2L at rest.  I will print the snapshot, make the correction and place it on your desk (for Monday) for initial and date if that is okay.  I'm am happy to talk with you before doing this if you prefer.  Just let me know.

## 2019-10-19 NOTE — Telephone Encounter (Signed)
That sounds fine to me

## 2019-10-19 NOTE — Telephone Encounter (Signed)
I guess I don't understand what I need to write. Is her qualifying walk documented in the chart? Where is the documented O2 sat of 96%????

## 2019-10-22 NOTE — Telephone Encounter (Signed)
Snapshot corrected by Eustaquio Maize NP and faxed to her personal fax number as requested   Called Melissa's number, was forwarded to main [employer] number.  Spoke with Dariah who will send message to both Lenna Sciara and Levada Dy to let them know this.  Would like to verify that fax was received AND if we can go ahead and change the [facility] note for the 96% to have been on 2L at rest.

## 2019-10-23 NOTE — Telephone Encounter (Signed)
Megan [name] w/ [employer] returned call Megan [name] is not in the office at all this week and snapshot needs to be sent to different fax number >> [phone].  This has been done.  Will check back with Megan [name] to ensure fax was received.

## 2019-10-23 NOTE — Telephone Encounter (Signed)
LMOM TCB x1 for Levada Dy @ [phone] to make sure they got the fax from yesterday.

## 2019-10-29 ENCOUNTER — Telehealth: Payer: Self-pay

## 2019-10-29 NOTE — Telephone Encounter (Signed)
Not sure what she means -   Does she mean bc of the copd severity, or something to do with risk of covid?

## 2019-10-29 NOTE — Telephone Encounter (Signed)
Copied from [place] [id number]. Topic: General - Other >> Oct 29, 2019 11:08 AM Alanda Slim E wrote: Reason for CRM: Pt needs a letter stating that she is not able to care for her grand kids at this time due to her COPD / Please call when this can picked up? Pt needs this asap

## 2019-10-29 NOTE — Telephone Encounter (Signed)
Please advise 

## 2019-10-30 NOTE — Telephone Encounter (Signed)
[place] for letter but to whom and what is the letter for?  I would not feel comfortable with increasing a controlled substance by email, sorry

## 2019-10-30 NOTE — Telephone Encounter (Signed)
The pt called and stated that this time time of year she has a lot of flair up and she is not able to care for them physically due to the COPD severity and her grandchildren are disabled/ so its not so much of a covid issue since they dont go out like that. / please advise    Pt also wanted to ask if her Xanax dose can be increased a bit for her nerves / Pt states that her current strength is not helping as much/ please advise

## 2019-10-31 ENCOUNTER — Telehealth: Payer: Self-pay | Admitting: Primary Care

## 2019-10-31 NOTE — Telephone Encounter (Signed)
Called [employer] and spoke with Megan [name] was able to confirm the information was received as of 10/24/19. The information was forwarded to the appropriate department, they do not have any notation of any additional required information from our office at this time.  Megan [name] said she would forward a message to follow up and confirm they have what they need and let us know if more is required.  Nothing further needed at this time.

## 2019-10-31 NOTE — Telephone Encounter (Signed)
Notified pt with Dr. Jenny Reichmann message and letter need to send to [facility].

## 2019-10-31 NOTE — Telephone Encounter (Signed)
Patient called to check status of the increased dose on the  ALPRAZolam (XANAX) 0.25 MG tablet that she requested. Please call Ph#  [phone]

## 2019-10-31 NOTE — Telephone Encounter (Signed)
Called Melissa back and she stated the fax received was reviewed.  Will call back with more detail if needed.

## 2019-10-31 NOTE — Telephone Encounter (Signed)
Sorry I still dont understand the question or need for incresaed med or letter  Maybe need to consider OV

## 2019-11-06 NOTE — Telephone Encounter (Signed)
Pt called to follow up on letter for Medicaid. Pt would like a callback [phone]

## 2019-11-06 NOTE — Telephone Encounter (Signed)
LVM--to call the office back and make an appointment for an office visit

## 2019-11-07 ENCOUNTER — Telehealth: Payer: Self-pay | Admitting: Emergency Medicine

## 2019-11-07 ENCOUNTER — Ambulatory Visit (INDEPENDENT_AMBULATORY_CARE_PROVIDER_SITE_OTHER): Payer: Medicare Other | Admitting: Internal Medicine

## 2019-11-07 ENCOUNTER — Encounter: Payer: Self-pay | Admitting: Internal Medicine

## 2019-11-07 VITALS — Ht 63.0 in | Wt 90.0 lb

## 2019-11-07 DIAGNOSIS — R739 Hyperglycemia, unspecified: Secondary | ICD-10-CM

## 2019-11-07 DIAGNOSIS — F419 Anxiety disorder, unspecified: Secondary | ICD-10-CM

## 2019-11-07 DIAGNOSIS — D649 Anemia, unspecified: Secondary | ICD-10-CM

## 2019-11-07 DIAGNOSIS — J449 Chronic obstructive pulmonary disease, unspecified: Secondary | ICD-10-CM

## 2019-11-07 MED ORDER — ALBUTEROL SULFATE HFA 108 (90 BASE) MCG/ACT IN AERS: INHALATION_SPRAY | RESPIRATORY_TRACT | 11 refills | Status: DC

## 2019-11-07 NOTE — Assessment & Plan Note (Signed)
stable overall by history and exam, recent data reviewed with pt, and pt to continue medical treatment as before,  to f/u any worsening symptoms or concerns  

## 2019-11-07 NOTE — Patient Instructions (Addendum)
Please continue all other medications as before, and refills have been done if requested.  Please have the pharmacy call with any other refills you may need.  Please continue your efforts at being more active, low cholesterol diet, and weight control.  Please keep your appointments with your specialists as you may have planned  OK for the letter as discussed

## 2019-11-07 NOTE — Progress Notes (Signed)
Patient ID: Megan [name], female   DOB: 1951/03/25, 69 y.o.   MRN: [id number]  Virtual Visit via Video Note  I connected with Megan [name] on 11/07/19 at  2:00 PM EST by a video enabled telemedicine application and verified that I am speaking with the correct person using two identifiers.  Location: Patient: at home Provider: at office   I discussed the limitations of evaluation and management by telemedicine and the availability of in person appointments. The patient expressed understanding and agreed to proceed.  History of Present Illness: Here to f/u; overall doing ok,  Pt denies chest pain, increasing sob or doe, wheezing, orthopnea, PND, increased LE swelling, palpitations, dizziness or syncope.  Pt denies new neurological symptoms such as new headache, or facial or extremity weakness or numbness.  Pt denies polydipsia, polyuria, or low sugar episode.  Pt states overall good compliance with meds, mostly trying to follow appropriate diet, with wt overall stable,  but little exercise however.  Is on home o2 2.5 L [place] continuous.  Pt has daughter who works full time in the home but fell with injury and unable to function, but also 3 minors - 2 of whom are disabled and require 24/7 care.  She cannot care for them and need a letter to [facility] so that the disabled children can be restarted with the 40 hrs per wk (each) care they had prior to the mothers injury.  Denies worsening depressive symptoms, suicidal ideation, or panic; has ongoing anxiety Past Medical History:  Diagnosis Date  . Anemia   . Anxiety state 08/20/2015  . CAP (community acquired pneumonia)   . CHF (congestive heart failure) ([place])   . COPD (chronic obstructive pulmonary disease) ([place])   . Depression   . Essential hypertension 08/19/2015  . GERD (gastroesophageal reflux disease)   . Headache   . History of hiatal hernia   . Myocardial infarction ([place])   . Shortness of breath dyspnea    Past Surgical History:  Procedure  Laterality Date  . CARDIAC CATHETERIZATION    . CARDIAC CATHETERIZATION N/A 08/26/2015   Procedure: Left Heart Cath and Coronary Angiography;  Surgeon: Jettie Booze, MD;  Location: [facility];  Service: Cardiovascular;  Laterality: N/A;  . ESOPHAGOGASTRODUODENOSCOPY N/A 04/18/2018   Procedure: ESOPHAGOGASTRODUODENOSCOPY (EGD);  Surgeon: Ladene Artist, MD;  Location: [facility] ENDOS[facility];  Service: Endoscopy;  Laterality: N/A;  . tracheostomy      reports that she quit smoking about 19 years ago. Her smoking use included cigarettes. She has a 30.00 pack-year smoking history. She has never used smokeless tobacco. She reports that she does not drink alcohol or use drugs. family history includes Cirrhosis in her mother; Depression in her mother; Heart disease in her father. Allergies  Allergen Reactions  . Penicillins Rash and Hives    Has patient had a PCN reaction causing immediate rash, facial/tongue/throat swelling, SOB or lightheadedness with hypotension: No Has patient had a PCN reaction causing severe rash involving mucus membranes or skin necrosis:NO Has patient had a PCN reaction that required hospitalization No Has patient had a PCN reaction occurring within the last 10 years:NO If all of the above answers are "NO", then may proceed with Cephalosporin use.   Current Outpatient Medications on File Prior to Visit  Medication Sig Dispense Refill  . ALPRAZolam (XANAX) 0.25 MG tablet 1 tab by mouth twice per day as needed 60 tablet 2  . aspirin EC 81 MG tablet Take 81 mg by mouth daily.    Marland Kitchen  azelastine (ASTELIN) 0.1 % nasal spray Place 1 spray into both nostrils 2 (two) times daily. Use in each nostril as directed 30 mL 6  . butalbital-acetaminophen-caffeine (FIORICET, ESGIC) 50-325-40 MG tablet Take 1 tablet by mouth 2 (two) times daily as needed for headache. 30 tablet 0  . diltiazem (CARDIZEM CD) 240 MG 24 hr capsule Take 1 capsule (240 mg total) by mouth daily. 90 capsule 1  .  feeding supplement, ENSURE ENLIVE, (ENSURE ENLIVE) LIQD Take 237 mLs by mouth 4 (four) times daily. 237 mL 0  . fluticasone (FLONASE) 50 MCG/ACT nasal spray SHAKE LIQUID AND USE 2 SPRAYS IN EACH NOSTRIL DAILY 16 g 5  . Fluticasone-Umeclidin-Vilant (TRELEGY ELLIPTA) 100-62.5-25 MCG/INH AEPB Inhale 1 puff into the lungs daily. 60 each 5  . guaiFENesin (MUCINEX) 600 MG 12 hr tablet Take 2 tablets (1,200 mg total) by mouth 2 (two) times daily. 30 tablet 1  . ipratropium-albuterol (DUONEB) 0.5-2.5 (3) MG/3ML SOLN Take 3 mLs by nebulization every 6 (six) hours as needed. 360 mL 3  . lidocaine (LIDODERM) 5 % Place 1 patch onto the skin daily. Remove & Discard patch within 12 hours or as directed by MD 10 patch 0  . loratadine (CLARITIN) 10 MG tablet TAKE 1 TABLET BY MOUTH DAILY 90 tablet 3  . metoprolol tartrate (LOPRESSOR) 25 MG tablet TAKE 1/2 TABLET(12.5 MG) BY MOUTH TWICE DAILY 90 tablet 0  . montelukast (SINGULAIR) 10 MG tablet Take 1 tablet (10 mg total) by mouth at bedtime. 30 tablet 11  . Multiple Vitamin (TAB-A-VITE/BETA CAROTENE) TABS Take 1 tablet by mouth daily.    . pantoprazole (PROTONIX) 40 MG tablet Take 1 tablet (40 mg total) by mouth daily. 90 tablet 1  . polyethylene glycol (MIRALAX / GLYCOLAX) packet Take 17 g by mouth 2 (two) times daily. (Patient taking differently: Take 17 g by mouth daily as needed for mild constipation. ) 14 each 0  . predniSONE (DELTASONE) 10 MG tablet Take 4 tabs once daily x3 days, then 3 tabs for 3 days, 2 tabs for 3 days then resume regular 10mg  daily dose 27 tablet 0  . predniSONE (DELTASONE) 10 MG tablet Take 1 tablet (10 mg total) by mouth daily with breakfast. 30 tablet 1  . senna-docusate (SENOKOT-S) 8.6-50 MG tablet Take 1 tablet by mouth 2 (two) times daily. (Patient taking differently: Take 1 tablet by mouth at bedtime as needed for mild constipation. ) 120 tablet 1  . tiZANidine (ZANAFLEX) 2 MG tablet Take 1 tablet (2 mg total) by mouth every 6 (six)  hours as needed for muscle spasms. 60 tablet 3  . Vitamin D, Ergocalciferol, (DRISDOL) 1.25 MG (50000 UT) CAPS capsule Take 1 capsule (50,000 Units total) by mouth every 7 (seven) days. 12 capsule 0   No current facility-administered medications on file prior to visit.    Observations/Objective: Alert, NAD, appropriate mood and affect, resps normal, cn 2-12 intact, moves all 4s, no visible rash or swelling Lab Results  Component Value Date   WBC 12.4 (H) 04/19/2019   HGB 11.5 (L) 04/19/2019   HCT 36.8 04/19/2019   PLT 436.0 (H) 04/19/2019   GLUCOSE 157 (H) 04/19/2019   CHOL 241 (H) 04/19/2019   TRIG 145.0 04/19/2019   HDL 98.90 04/19/2019   LDLCALC 113 (H) 04/19/2019   ALT 13 04/19/2019   AST 15 04/19/2019   NA 137 04/19/2019   K 4.7 04/19/2019   CL 91 (L) 04/19/2019   CREATININE 0.76 04/19/2019   BUN  14 04/19/2019   CO2 41 (H) 04/19/2019   TSH 0.28 (L) 04/19/2019   INR 1.05 03/09/2017   HGBA1C 6.4 04/19/2019   Assessment and Plan: See notes  Follow Up Instructions: See notes   I discussed the assessment and treatment plan with the patient. The patient was provided an opportunity to ask questions and all were answered. The patient agreed with the plan and demonstrated an understanding of the instructions.   The patient was advised to call back or seek an in-person evaluation if the symptoms worsen or if the condition fails to improve as anticipated.    Cathlean Cower, MD

## 2019-11-07 NOTE — Telephone Encounter (Signed)
Called patient but call went straight to VM. Left message for patient to call back.  

## 2019-11-07 NOTE — Assessment & Plan Note (Signed)
Stable

## 2019-11-09 NOTE — Telephone Encounter (Signed)
Pt sched for 11/07/2019

## 2019-11-12 ENCOUNTER — Telehealth: Payer: Self-pay | Admitting: *Deleted

## 2019-11-12 ENCOUNTER — Other Ambulatory Visit: Payer: Self-pay

## 2019-11-12 DIAGNOSIS — F4312 Post-traumatic stress disorder, chronic: Secondary | ICD-10-CM

## 2019-11-12 MED ORDER — ALPRAZOLAM 0.25 MG PO TABS: ORAL_TABLET | ORAL | 5 refills | Status: DC

## 2019-11-12 NOTE — Telephone Encounter (Signed)
-----   Message from Cresenciano Lick, [place] sent at 11/12/2019  4:25 PM EST -----  ----- Message ----- From: Cresenciano Lick, CMA Sent: 11/12/2019   4:01 PM EST To: Cresenciano Lick, CMA   ----- Message ----- From: [name], MD Sent: 11/07/2019   7:41 PM EST To: Elta Guadeloupe, CMA, Otilio Miu, CMA  Please to print letter from today for me to sign  Then fax to [phone] attn Shada Baumgardner at [facility] (phone [phone])  Then mail original to Marveen Reeks

## 2019-11-12 NOTE — Telephone Encounter (Signed)
ATC pt, line went straight to voicemail but her voicemail is full. Will try back.

## 2019-11-12 NOTE — Telephone Encounter (Signed)
Done erx 

## 2019-11-13 NOTE — Telephone Encounter (Signed)
ATC pt, line went straight to voicemail but her voicemail is full. Will try back.

## 2019-11-14 ENCOUNTER — Other Ambulatory Visit: Payer: Self-pay | Admitting: *Deleted

## 2019-11-14 MED ORDER — LORATADINE 10 MG PO TABS: 10.0000 mg | ORAL_TABLET | Freq: Every day | ORAL | 3 refills | Status: DC

## 2019-11-14 NOTE — Telephone Encounter (Signed)
We have attempted to contact pt several times with no success or call back from pt. Per triage protocol, message will be closed.  

## 2019-11-15 ENCOUNTER — Encounter: Payer: Self-pay | Admitting: Emergency Medicine

## 2019-11-15 ENCOUNTER — Ambulatory Visit (INDEPENDENT_AMBULATORY_CARE_PROVIDER_SITE_OTHER): Payer: Medicare Other | Admitting: Emergency Medicine

## 2019-11-15 ENCOUNTER — Other Ambulatory Visit: Payer: Self-pay

## 2019-11-15 DIAGNOSIS — J441 Chronic obstructive pulmonary disease with (acute) exacerbation: Secondary | ICD-10-CM

## 2019-11-15 DIAGNOSIS — J449 Chronic obstructive pulmonary disease, unspecified: Secondary | ICD-10-CM

## 2019-11-15 MED ORDER — PREDNISONE 10 MG PO TABS: 10.0000 mg | ORAL_TABLET | Freq: Every day | ORAL | 0 refills | Status: DC

## 2019-11-15 MED ORDER — DOXYCYCLINE HYCLATE 100 MG PO TABS: 100.0000 mg | ORAL_TABLET | Freq: Two times a day (BID) | ORAL | 0 refills | Status: DC

## 2019-11-15 NOTE — Assessment & Plan Note (Signed)
Severe COPD with an evolving acute exacerbation, possibly precipitated by allergic rhinitis.  She denies any sick contacts and does not have a viral prodrome, suspicion for COVID-19 or viral process is low.  She is on prednisone 10 mg daily at baseline.  I will treat her for an acute exacerbation with a prednisone taper back down to 10 mg as well as doxycycline for 7 days.  May need to consider increasing her baseline dose to 50 mg daily as she does exacerbate frequently.  Alternatively consider starting her on cyclical rotating antibiotics at the beginning of each month.  I will revisit with her after this acute exacerbation resolves.

## 2019-11-15 NOTE — Progress Notes (Signed)
Virtual Visit via Telephone Note  I connected with Megan [name] on 11/15/19 at  2:45 PM EST by telephone and verified that I am speaking with the correct person using two identifiers.  Location: Patient: Home Provider: Office   I discussed the limitations, risks, security and privacy concerns of performing an evaluation and management service by telephone and the availability of in person appointments. I also discussed with the patient that there may be a patient responsible charge related to this service. The patient expressed understanding and agreed to proceed.   History of Present Illness: 69 year old woman with severe COPD and chronic bronchitic phenotype. She also has hypertension with diastolic CHF and secondary pulmonary hypertension. She has been hospitalized in the past for acute on chronic respiratory failure, required tracheostomy. She has been on oxygen at 2 to 4 L/min and uses a trilogy ventilator at night for support - uses it most nights.  Currently managed on Trelegy, prednisone 10 mg daily.  She has albuterol which she uses approximately 4-6x a day.  She has allergic rhinitis for which she is on loratadine, Singulair, fluticasone nasal spray   Observations/Objective: She reports that she began to get some increased congestion and allergy sx over the last 2 weeks, no fevers, no GI sx. Smell and taste intact, no exposures. Starting to evolve some chest tightness, cough prod of thick, greenish/ brown  Assessment and Plan: COPD exacerbation (HCC) Severe COPD with an evolving acute exacerbation, possibly precipitated by allergic rhinitis.  She denies any sick contacts and does not have a viral prodrome, suspicion for COVID-19 or viral process is low.  She is on prednisone 10 mg daily at baseline.  I will treat her for an acute exacerbation with a prednisone taper back down to 10 mg as well as doxycycline for 7 days.  May need to consider increasing her baseline dose to 50 mg daily as  she does exacerbate frequently.  Alternatively consider starting her on cyclical rotating antibiotics at the beginning of each month.  I will revisit with her after this acute exacerbation resolves.  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  She is on a trilogy ventilator and uses it most nights.  Will need to address the issue of goals of care, CODE STATUS going forward.   Follow Up Instructions: 1 month.    I discussed the assessment and treatment plan with the patient. The patient was provided an opportunity to ask questions and all were answered. The patient agreed with the plan and demonstrated an understanding of the instructions.   The patient was advised to call back or seek an in-person evaluation if the symptoms worsen or if the condition fails to improve as anticipated.  I provided 21 minutes of non-face-to-face time during this encounter.   Collene Gobble, MD

## 2019-11-15 NOTE — Assessment & Plan Note (Signed)
Continue maintenance Trelegy, albuterol, supplemental oxygen.  She is on a trilogy ventilator and uses it most nights.  Will need to address the issue of goals of care, CODE STATUS going forward.

## 2019-12-06 ENCOUNTER — Other Ambulatory Visit: Payer: Self-pay | Admitting: Internal Medicine

## 2019-12-06 NOTE — Telephone Encounter (Signed)
Patient is requesting a refill on the following medication : metoprolol tartrate (LOPRESSOR) 25 MG tablet  diltiazem (CARDIZEM CD) 240 MG 24 hr capsule [id number] - [place], [place] [address] Phone:  [phone]  Fax:  [phone]

## 2019-12-07 MED ORDER — DILTIAZEM HCL ER COATED BEADS 240 MG PO CP24: 240.0000 mg | ORAL_CAPSULE | Freq: Every day | ORAL | 1 refills | Status: DC

## 2019-12-07 MED ORDER — METOPROLOL TARTRATE 25 MG PO TABS: ORAL_TABLET | ORAL | 1 refills | Status: DC

## 2019-12-07 NOTE — Telephone Encounter (Signed)
Reviewed chart pt is up-to-date sent refills to p[facility].../lmb  

## 2019-12-08 ENCOUNTER — Other Ambulatory Visit: Payer: Self-pay | Admitting: Internal Medicine

## 2019-12-08 NOTE — Telephone Encounter (Signed)
Please refill as per office routine med refill policy (all routine meds refilled for 3 mo or monthly per pt preference up to one year from last visit, then month to month grace period for 3 mo, then further med refills will have to be denied)  

## 2019-12-10 ENCOUNTER — Ambulatory Visit (INDEPENDENT_AMBULATORY_CARE_PROVIDER_SITE_OTHER): Payer: Medicare Other | Admitting: Primary Care

## 2019-12-10 ENCOUNTER — Other Ambulatory Visit: Payer: Self-pay

## 2019-12-10 ENCOUNTER — Telehealth: Payer: Self-pay | Admitting: Emergency Medicine

## 2019-12-10 ENCOUNTER — Encounter: Payer: Self-pay | Admitting: Primary Care

## 2019-12-10 DIAGNOSIS — J9612 Chronic respiratory failure with hypercapnia: Secondary | ICD-10-CM

## 2019-12-10 DIAGNOSIS — J9611 Chronic respiratory failure with hypoxia: Secondary | ICD-10-CM | POA: Diagnosis not present

## 2019-12-10 DIAGNOSIS — J441 Chronic obstructive pulmonary disease with (acute) exacerbation: Secondary | ICD-10-CM | POA: Diagnosis not present

## 2019-12-10 MED ORDER — LIDOCAINE 5 % EX PTCH: 1.0000 | MEDICATED_PATCH | CUTANEOUS | 0 refills | Status: DC

## 2019-12-10 MED ORDER — MUCINEX 600 MG PO TB12: 1200.0000 mg | ORAL_TABLET | Freq: Two times a day (BID) | ORAL | 1 refills | Status: DC

## 2019-12-10 MED ORDER — AZITHROMYCIN 250 MG PO TABS: ORAL_TABLET | ORAL | 0 refills | Status: DC

## 2019-12-10 MED ORDER — PREDNISONE 20 MG PO TABS: ORAL_TABLET | ORAL | 0 refills | Status: AC

## 2019-12-10 MED ORDER — PREDNISONE 20 MG PO TABS: 20.0000 mg | ORAL_TABLET | Freq: Every day | ORAL | 1 refills | Status: DC

## 2019-12-10 NOTE — Telephone Encounter (Signed)
Spoke with the pt  She is c/o increased SOB x 2 days  Also has cough with gold to green sputum  Chills and aches but no fever  Televisit with Beth at 1:30 today  She states better to do televisit bc she was scared would not have good enough service for video

## 2019-12-10 NOTE — Patient Instructions (Signed)
Rx zpack; prednisone 40mg  x 2 weeks and then stay on 20mg  daily - Continue double strength mucinex wice daily - Continue TRELEGY one puff daily; PRN albuterol 2 puffs every 6 hours  Chronic respiratory failure - Continue supplemental oxygen 2-4L and Trilogy ventilator support at night   Allergic rhinitis - Continue flonase nasal spray, Singulair and Claritin   Follow Up Instructions:   - Recommending 1 month FU with Dr. Lamonte Sakai

## 2019-12-10 NOTE — Progress Notes (Signed)
Virtual Visit via Telephone Note  I connected with Megan [name] on 12/10/19 at  1:30 PM EST by telephone and verified that I am speaking with the correct person using two identifiers.  Location: Patient: Home Provider: Office   I discussed the limitations, risks, security and privacy concerns of performing an evaluation and management service by telephone and the availability of in person appointments. I also discussed with the patient that there may be a patient responsible charge related to this service. The patient expressed understanding and agreed to proceed.   History of Present Illness: 69 year female, former smoker quit 2001. PMH end-stage/severe COPD, chronic bronchitis, chronic respiratory failure with hypoxia (oxygen dependent), past tracheostomy in 2018, allergic rhinitis, esophageal reflex, diastolic CHF, secondary pulmonary hypertension. Patient of Dr. Lamonte Sakai.  Maintained on Trelegy and 10mg  prednisone. On chronic oxygen 2-4L and Trilogy ventilator at night for support.   Previous [facility] pulmonary Encounter: 03/01/2019  Patient called today for 1 month follow-up. States that she is doing well. Feels daily prednisone has been helping. Reports that she had to take an extra half tab for 1-2 days which helped get her over a "hump". She continues to have some congestion, taking mucinex twice daily. Using Trelegy inhaler daily as prescribed. States current nebulizer machine is broken/not working. She has used duonebs in the past and would like a refill. Wearing Trilogy ventilator most night, nasal congestion does prevent her from wearing it at times. Feels stuffed up, she has been using flonase nasal spray. Asking to have zpack on hand for COPD flare. Continue prednisone 10mg  daily until follow-up with Dr. Lamonte Sakai. May consider tapering dose off at that visit.    06/12/2019 Patient presents today for follow-up visit. Doing well overall. Feels like daily prednisone has been helping her breathing.  She has not been hospitalized since Jan 2020. States that she has required a couple prednisone tapers since March 2020. She is a little short of breath with weather changes and heat/humidity. Reports post nasal drip and nasal dryness. She has been using flonase daily. Continue Trelegy 1 puff daily. Wears Trilogy ventilator at night. Using 2-2.5L oxygen continuously. Denies current wheezing or cough.   08/07/2019 Patient presents today to re-qualify for oxygen. Treated for COPD exacerbation in mid-late September with Zpack and prednisone taper, states that she is feeling better except for chronic fatigue. Breathing is at baseline. No significant cough. Continues trelegy as prescribed and Duonebs q 6 hours prn. She cares for young children at home, trying to stat active as best she can. Afebrtile.   11/15/19 Acute visit with Dr. Lamonte Sakai  COPD exacerbation treated with Doxycycline and prednisone taper  Consider changing daily 10mg  prednisone to 50mg  daily ; cyclical rotating antibiotics beginning of each month  Continue Trelegy, albuterol Continue Triology ventilator  12/10/2019 Patient contacted today for acute televisit. Reports worsening shortness of breath x 2 days with productive cough with gold/green mucus. States that she has had better days but also had worse. She is having a harder time with the cold weather change. She is compliant with Trelegy use. Using albuterol hfa 3-4 times a day. No recent sick contact or known Covid exposure. She does not leave her house. Denies fever, HA, N/V/D, change in smell or taste.   Observations/Objective:  - No respiratory distress noted  - Able to speak in full sentences  Assessment and Plan:  End-stage/Severe COPD: - Most recent exacerbation was 3 weeks ago treated with Doxycycline and prednisone taper  - Reports increased  sob and cough x 2 days with purulent mucus  - Discussed with patient about increasing daily prednisone and adding monthly abx - She  would like to hold off on cyclical rotating abx until follow-up visit  - Rx zpack; prednisone 40mg  x 2 weeks and then stay on 20mg  daily - Continue double strength mucinex wice daily - Continue TRELEGY one puff daily; PRN albuterol 2 puffs every 6 hours - Need to continue to discuss CODE status   Chronic respiratory failure - Continue supplemental oxygen 2-4L and Trilogy ventilator support at night   Allergic rhinitis - Continue flonase nasal spray, Singulair and Claritin   Follow Up Instructions:   - Recommending 1 month FU with Dr. Lamonte Sakai   I discussed the assessment and treatment plan with the patient. The patient was provided an opportunity to ask questions and all were answered. The patient agreed with the plan and demonstrated an understanding of the instructions.   The patient was advised to call back or seek an in-person evaluation if the symptoms worsen or if the condition fails to improve as anticipated.  I provided 22 minutes of non-face-to-face time during this encounter.   Martyn Ehrich, NP

## 2019-12-14 ENCOUNTER — Ambulatory Visit: Payer: Medicare Other | Admitting: Internal Medicine

## 2019-12-19 ENCOUNTER — Ambulatory Visit: Payer: Medicare Other | Admitting: Emergency Medicine

## 2020-01-01 DIAGNOSIS — H2511 Age-related nuclear cataract, right eye: Secondary | ICD-10-CM | POA: Diagnosis not present

## 2020-01-01 DIAGNOSIS — Z961 Presence of intraocular lens: Secondary | ICD-10-CM | POA: Diagnosis not present

## 2020-01-01 DIAGNOSIS — H25811 Combined forms of age-related cataract, right eye: Secondary | ICD-10-CM | POA: Diagnosis not present

## 2020-01-01 DIAGNOSIS — Z01818 Encounter for other preprocedural examination: Secondary | ICD-10-CM | POA: Diagnosis not present

## 2020-01-08 ENCOUNTER — Telehealth: Payer: Self-pay | Admitting: Emergency Medicine

## 2020-01-08 NOTE — Telephone Encounter (Signed)
Pt called back about medical clearance for upcoming surgery.

## 2020-01-08 NOTE — Telephone Encounter (Signed)
Dr. Lamonte Sakai, please advise if you have received medical clearance form on pt?

## 2020-01-11 NOTE — Telephone Encounter (Signed)
Checked RB's scan folder and there were no forms in there.  Checked pt's media tab in chart and forms have not been scanned.  Attempted to call [facility] to have form refaxed, but was on hold X5 minutes with no answer from office.   Will call back 3/15 morning to re-request forms

## 2020-01-11 NOTE — Telephone Encounter (Signed)
I do not have it with me. I filled out some clearance forms on my most recent office day, maybe it was one of those?

## 2020-02-06 ENCOUNTER — Encounter: Payer: Self-pay | Admitting: Emergency Medicine

## 2020-02-06 ENCOUNTER — Other Ambulatory Visit: Payer: Self-pay

## 2020-02-06 ENCOUNTER — Ambulatory Visit (INDEPENDENT_AMBULATORY_CARE_PROVIDER_SITE_OTHER): Payer: Medicare Other | Admitting: Emergency Medicine

## 2020-02-06 DIAGNOSIS — J9611 Chronic respiratory failure with hypoxia: Secondary | ICD-10-CM

## 2020-02-06 NOTE — Progress Notes (Signed)
Virtual Visit via Telephone Note  I connected with Chastity Noland on 02/06/20 at 11:30 AM EDT by telephone and verified that I am speaking with the correct person using two identifiers.  Location: Patient: ??? Provider: Office   I discussed the limitations, risks, security and privacy concerns of performing an evaluation and management service by telephone and the availability of in person appointments. I also discussed with the patient that there may be a patient responsible charge related to this service. The patient expressed understanding and agreed to proceed.   History of Present Illness: 69 year old woman with severe COPD and chronic bronchitic phenotype.  Also with hypertension and diastolic dysfunction, secondary pulmonary hypertension, chronic hypoxemic respiratory failure.  She had a tracheostomy in the past for respiratory failure.  We have managed her on chronic prednisone daily, Trelegy, albuterol.  She uses a trilogy ventilator at night for chronic respiratory failure. She is on loratadine, Singulair, fluticasone nasal spray for allergic rhinitis I treated for an acute exacerbation in January 2021, then she had her prednisone increased from 10 mg to 20 mg daily.   Observations/Objective: Attempted to call patient but unable to reach her.  Assessment and Plan: Will need to reschedule phone visit to assess her status on the current regimen, increased prednisone  Follow Up Instructions:    I discussed the assessment and treatment plan with the patient. The patient was provided an opportunity to ask questions and all were answered. The patient agreed with the plan and demonstrated an understanding of the instructions.   The patient was advised to call back or seek an in-person evaluation if the symptoms worsen or if the condition fails to improve as anticipated.  I provided 0 minutes of non-face-to-face time during this encounter.   Collene Gobble, MD

## 2020-02-26 ENCOUNTER — Other Ambulatory Visit: Payer: Self-pay | Admitting: Primary Care

## 2020-02-27 ENCOUNTER — Ambulatory Visit (INDEPENDENT_AMBULATORY_CARE_PROVIDER_SITE_OTHER): Payer: Medicare Other | Admitting: Emergency Medicine

## 2020-02-27 ENCOUNTER — Encounter: Payer: Self-pay | Admitting: Emergency Medicine

## 2020-02-27 ENCOUNTER — Other Ambulatory Visit: Payer: Self-pay

## 2020-02-27 DIAGNOSIS — J449 Chronic obstructive pulmonary disease, unspecified: Secondary | ICD-10-CM | POA: Diagnosis not present

## 2020-02-27 MED ORDER — AZITHROMYCIN 250 MG PO TABS: ORAL_TABLET | ORAL | 0 refills | Status: DC

## 2020-02-27 MED ORDER — DOXYCYCLINE HYCLATE 100 MG PO TABS: 100.0000 mg | ORAL_TABLET | Freq: Two times a day (BID) | ORAL | 0 refills | Status: DC

## 2020-02-27 MED ORDER — PREDNISONE 5 MG PO TABS: 15.0000 mg | ORAL_TABLET | Freq: Every day | ORAL | 5 refills | Status: DC

## 2020-02-27 MED ORDER — DOXYCYCLINE HYCLATE 100 MG PO TABS: 100.0000 mg | ORAL_TABLET | Freq: Two times a day (BID) | ORAL | 5 refills | Status: DC

## 2020-02-27 MED ORDER — AZITHROMYCIN 250 MG PO TABS
250.0000 mg | ORAL_TABLET | Freq: Every day | ORAL | 0 refills | Status: DC
Start: 2020-02-27 — End: 2020-03-17

## 2020-02-27 MED ORDER — AZITHROMYCIN 250 MG PO TABS: 250.0000 mg | ORAL_TABLET | Freq: Once | ORAL | 5 refills | Status: DC

## 2020-02-27 MED ORDER — AZITHROMYCIN 250 MG PO TABS: 250.0000 mg | ORAL_TABLET | Freq: Once | ORAL | 0 refills | Status: DC

## 2020-02-27 NOTE — Progress Notes (Signed)
Virtual Visit via Telephone Note  I connected with Megan [name] on 02/27/20 at 12:00 PM EDT by telephone and verified that I am speaking with the correct person using two identifiers.  Location: Patient: Home Provider: Office   I discussed the limitations, risks, security and privacy concerns of performing an evaluation and management service by telephone and the availability of in person appointments. I also discussed with the patient that there may be a patient responsible charge related to this service. The patient expressed understanding and agreed to proceed.   History of Present Illness: 68 year old woman with severe COPD with chronic hypoxemia, chronic bronchitic symptoms, hypertension, diastolic dysfunction and secondary pulmonary hypertension.  She is on chronic prednisone, Trelegy, albuterol.  She uses a trilogy ventilator at night for chronic respiratory failure, moderate compliance .  Her current prednisone dose is 20mg  daily. Allergic rhinitis on loratadine, Singulair, fluticasone nasal spray   Observations/Objective:   Assessment and Plan: Severe COPD We will continue her nebulized regimen as above Plan to decrease prednisone to 15 mg daily Start rotating cyclical antibiotics for the first week of each month: -Azithromycin, Z-Pak -Doxycycline 100 mg twice a day for 7 days Continue trilogy ventilator nightly.  I encouraged her to have better compliance with this.  Explained that her risk for respiratory failure goes up significantly if she cannot use it reliably Discussed COVID-19 vaccination with her and have encouraged her to schedule appointment and get vaccinated.  She understands and agrees.  Chronic hypoxemic resp failure Continue her current submental oxygen as ordered  Secondary PAH  Allergic rhinitis Continue Singulair, fluticasone, loratadine  Follow Up Instructions: 2 months  I discussed the assessment and treatment plan with the patient. The patient was  provided an opportunity to ask questions and all were answered. The patient agreed with the plan and demonstrated an understanding of the instructions.   The patient was advised to call back or seek an in-person evaluation if the symptoms worsen or if the condition fails to improve as anticipated.  I provided 21 minutes of non-face-to-face time during this encounter.   Collene Gobble, MD

## 2020-03-06 DIAGNOSIS — H2511 Age-related nuclear cataract, right eye: Secondary | ICD-10-CM | POA: Diagnosis not present

## 2020-03-06 DIAGNOSIS — H25811 Combined forms of age-related cataract, right eye: Secondary | ICD-10-CM | POA: Diagnosis not present

## 2020-03-14 ENCOUNTER — Telehealth: Payer: Self-pay | Admitting: Emergency Medicine

## 2020-03-14 MED ORDER — LEVOFLOXACIN 500 MG PO TABS
500.0000 mg | ORAL_TABLET | Freq: Every day | ORAL | 0 refills | Status: DC
Start: 2020-03-14 — End: 2020-06-18

## 2020-03-14 NOTE — Telephone Encounter (Signed)
LMTCB x 1 

## 2020-03-14 NOTE — Telephone Encounter (Addendum)
Will send course of oral Levaquin - take 1 tab daily x 7 days. Take prednisone 20mg  x 5 days then return to 15mg  daily. Make sure she is using mucinex double strength twice daily. Make sure she is wearing Trilogy ventilator at night. If she doesn't have a flutter valve please order.

## 2020-03-14 NOTE — Telephone Encounter (Signed)
Assessment and Plan from [place] 4/28: Severe COPD We will continue her nebulized regimen as above Plan to decrease prednisone to 15 mg daily Start rotating cyclical antibiotics for the first week of each month: -Azithromycin, Z-Pak -Doxycycline 100 mg twice a day for 7 days Continue trilogy ventilator nightly.  I encouraged her to have better compliance with this.  Explained that her risk for respiratory failure goes up significantly if she cannot use it reliably Discussed COVID-19 vaccination with her and have encouraged her to schedule appointment and get vaccinated.  She understands and agrees.  Chronic hypoxemic resp failure Continue her current submental oxygen as ordered  Secondary PAH  Allergic rhinitis Continue Singulair, fluticasone, loratadine  Follow Up Instructions: 2 months I discussed the assessment and treatment plan with the patient. The patient was provided an opportunity to ask questions and all were answered. The patient agreed with the plan and demonstrated an understanding of the instructions. The patient was advised to call back or seek an in-person evaluation if the symptoms worsen or if the condition fails to improve as anticipated.   Called and spoke with pt who stated she has had another flare up and wants to know what she needs to do as she is currently already on two abx that she is taking alternating months. Pt is on both doxy as well as azithromycin which she is alternating when she takes those. Pt stated that she did just take the doxy first week of May as she was supposed to do but since she has had the flare, she wants to know what could be recommended.  Pt is coughing up yellow to green phlegm and states she is coughing up a lot of it. Pt also has some tightness in her chest. Pt stated that she is also down to 15mg  prednisone as that was discussed at last OV for her to try and she wants to know if she should increase it.   Flare started 2 days ago per  pt. Beth, please advise.

## 2020-03-15 NOTE — Telephone Encounter (Signed)
Pt called at 530 pm 03/14/2020   wanting to know what to do with abx being rotated as per last note as has none but I seen levaquin has already been called in.   Pt says can't get her breath, feels "lungs filling up" with congestion, has not used neb yet  I did not have   computer access at the time of the call and rec prednisone 20 mg bid thru the weekend and neb up to every 4 h if needed and if still can't get her breath then go to ER.  Will need to check 5/17 to see if she had to go to er if not  1) taper for Prednisone needs to be recalibrated 2) refills for zpak and doxy once completes levaquin

## 2020-03-17 MED ORDER — DOXYCYCLINE HYCLATE 100 MG PO TABS: 100.0000 mg | ORAL_TABLET | Freq: Two times a day (BID) | ORAL | 5 refills | Status: DC

## 2020-03-17 MED ORDER — AZITHROMYCIN 250 MG PO TABS: 250.0000 mg | ORAL_TABLET | Freq: Every day | ORAL | 5 refills | Status: DC

## 2020-03-17 NOTE — Telephone Encounter (Signed)
I am sorry to hear the patient is having the symptoms.  Unfortunately if she is status post Levaquin, azithromycin, and doxycycline given the cyclical antibiotics with Dr. Lamonte Sakai has started her on.  And she is on 40 mg daily for prednisone.  There is not much more we can do from an outpatient management.  If her symptoms are clinically worsening I would recommended that she contact 911 or present to an emergency room for in person evaluation and emergent care.  If she is so symptomatic that she is unable to even do a virtual visit I am unsure what we will be able to safely offer to her.  I would recommend that she seek emergent care now.  For further evaluation.  She can contact 911 for transportation.  Wyn Quaker, FNP

## 2020-03-17 NOTE — Telephone Encounter (Signed)
I called to check on this pt  She reports "feeling horrible"  Body aches for several days, cough with dark gold sputum- has been on doxy bid since 03/06/20 States not taking any other abx at this time She states her breathing has not improved on pred 20 mg bid that MW advised her to start on since she called in on 03/14/20  Denies any wheezing or fever  I advised that she is going to need a televisit and offered one today to discuss plan for meds and her symptoms She refused, stating that she was not feeling up to doing that today I have her scheduled for tomorrow with Aaron Edelman (has seen beth in the past but her scheduled was full) She is asking for recs in the meantime  Please advise thanks

## 2020-03-17 NOTE — Telephone Encounter (Signed)
Left message for patient to call back  

## 2020-03-17 NOTE — Telephone Encounter (Signed)
Called and spoke with pt letting her know the info stated by Aaron Edelman and stated to her that recommendations are for her to either call 911 or present to ED. Pt verbalized understanding. Nothing further needed.

## 2020-03-18 ENCOUNTER — Other Ambulatory Visit: Payer: Self-pay

## 2020-03-18 ENCOUNTER — Encounter: Payer: Self-pay | Admitting: Pulmonary Disease

## 2020-03-18 ENCOUNTER — Ambulatory Visit (INDEPENDENT_AMBULATORY_CARE_PROVIDER_SITE_OTHER): Payer: Medicare Other | Admitting: Pulmonary Disease

## 2020-03-18 DIAGNOSIS — J441 Chronic obstructive pulmonary disease with (acute) exacerbation: Secondary | ICD-10-CM

## 2020-03-18 DIAGNOSIS — J9612 Chronic respiratory failure with hypercapnia: Secondary | ICD-10-CM

## 2020-03-18 DIAGNOSIS — J9611 Chronic respiratory failure with hypoxia: Secondary | ICD-10-CM

## 2020-03-18 DIAGNOSIS — J449 Chronic obstructive pulmonary disease, unspecified: Secondary | ICD-10-CM

## 2020-03-18 NOTE — Progress Notes (Signed)
Virtual Visit via Telephone Note  I connected with Megan [name] on 03/18/20 at  2:00 PM EDT by telephone and verified that I am speaking with the correct person using two identifiers.  Location: Patient: Home Provider: Office [facility] - [address], [place], [place], [place] [address]   I discussed the limitations, risks, security and privacy concerns of performing an evaluation and management service by telephone and the availability of in person appointments. I also discussed with the patient that there may be a patient responsible charge related to this service. The patient expressed understanding and agreed to proceed.  Patient consented to consult via telephone: Yes People present and their role in pt care: Pt     History of Present Illness:  69 year old female former smoker followed in our office for COPD  Past medical history: Hypertension, protein calorie malnutrition, anxiety, PTSD, fibromyalgia, dysphagia Smoking history: Former smoker.  Quit 2001.  30-pack-year smoking history Maintenance: Trelegy Ellipta  Patient of Dr. Lamonte Sakai  Chief complaint: Acute Visit - cough, congestion   69 year old female never smoker followed in our office for COPD.  Patient was last seen in our office virtually in April/2021 by Dr. Lamonte Sakai.  At that time she was encouraged to remain on her nebulized medication regimen and decrease her prednisone to 15 mg daily.  Patient was started on cyclical antibiotics for the first week of each month.  Patient will take azithromycin in the following month take doxycycline.  She was encouraged to remain on her trilogy ventilator and have better compliance with this.  She is at high risk for respiratory failure if she cannot use the trilogy ventilator reliably.  Patient later contacted our office on 03/14/2020 she was treated with Levaquin and a short course of prednisone taper.  She then contacted our office reporting that symptoms had not improved. Pt  was recommended to seek emergent care. She did not do that.   Patient reached by telephone today.  She is reporting increased cough and congestion very short of breath.  She reports adherence every night to her trilogy vent.  She also reports she is under a significant amount of emotional environmental stress.  She reports "I am not happy that I am sick".  She does feel that her sputum is starting to break up more and its thinning out some today.  She never started the Levaquin.  She never picked this up from the pharmacy.  Patient has significant confusion regarding her medications today.  We will discuss this.  Patient did start the doxycycline on 03/06/2020 and completed that course.  She still has the azithromycin course which she was planning on you starting next month.  Patient has been self managing her prednisone dosing.  She is supposed to be on a baseline of 15 mg daily.  She reports currently right now she does decrease down to 35 mg.  We will review this today.  Observations/Objective:  11/04/2018-chest x-ray-emphysema with large lung volumes, old granulomatous disease, bone demineralization  Social History   Tobacco Use  Smoking Status Former Smoker  . Packs/day: 1.00  . Years: 30.00  . Pack years: 30.00  . Types: Cigarettes  . Quit date: 08/18/2000  . Years since quitting: 19.5  Smokeless Tobacco Never Used   Immunization History  Administered Date(s) Administered  . Fluad Quad(high Dose 65+) 08/07/2019  . HiB (PRP-OMP) 09/10/2015  . Influenza, High Dose Seasonal PF 08/27/2016, 07/25/2018  . Influenza,inj,Quad PF,6+ Mos 09/16/2017  . Pneumococcal Conjugate-13  09/16/2017  . Pneumococcal Polysaccharide-23 04/19/2019  . Tdap 09/16/2017    Assessment and Plan:  Discussion: COPD very severe end-stage with severe hyper [place] and hypoxia.  Emphasized the importance of her adherence to our treatment regimen.  She needs to start the Levaquin as originally prescribed on 03/14/2020.   She reports that she will coordinate this with her daughter.  She also needs close follow-up in our office for further evaluation.  I will see her in office next week.  She is well aware that if her symptoms worsen she needs to seek emergent evaluation and this cannot continue to be managed telephonically.  Chronic respiratory failure with hypoxia and hypercapnia (HCC) Plan: Continue trilogy vent at night Continue oxygen therapy Maintain oxygen saturations above 88 to 90% Close follow-up with our office within 7 days for an office evaluation  COPD, very severe/End- Stage with Severe Hypercarpnia and Hypoxia Plan: Continue Trelegy Ellipta Continue albuterol Continue oxygen therapy Continue home trilogy ventilator, use every night Start Levaquin as discussed Close follow-up in 1 week to further review medications and ensure symptomatic improvement  COPD exacerbation ([place]) Continued COPD exacerbation Status post doxycycline on 03/06/2020  Plan: Start Levaquin as originally prescribed Continue prednisone at 30 mg, work to get to 20 mg daily by next office visit next week   Follow Up Instructions:  Return in about 1 week (around 03/25/2020), or if symptoms worsen or fail to improve, for Follow up with Dr. Lamonte Sakai.   I discussed the assessment and treatment plan with the patient. The patient was provided an opportunity to ask questions and all were answered. The patient agreed with the plan and demonstrated an understanding of the instructions.   The patient was advised to call back or seek an in-person evaluation if the symptoms worsen or if the condition fails to improve as anticipated.  I provided 25 minutes of non-face-to-face time during this encounter.   Lauraine Rinne, NP

## 2020-03-18 NOTE — Patient Instructions (Addendum)
You were seen today by Lauraine Rinne, NP  for:   1. COPD exacerbation ([place])  Start Levaquin as prescribed  Continue to the decrease prednisone taper goal should be 20 mg daily by next office visit next week  Close follow-up with our office  If symptoms do not improve despite starting Levaquin please contact our office or seek emergent evaluation  2. COPD, very severe/End- Stage with Severe Hypercarpnia and Hypoxia  At last office visit Dr. Lamonte Sakai spoke with you telephonically about starting you on cyclical monthly antibiotics.    On 04/01/2020 please take the azithromycin as planned  On 05/01/2020 you will start taking doxycycline again as planned  We will further review this in office  Trelegy Ellipta  >>> 1 puff daily in the morning >>>rinse mouth out after use  >>> This inhaler contains 3 medications that help manage her respiratory status, contact our office if you cannot afford this medication or unable to remain on this medication  Only use your albuterol as a rescue medication to be used if you can't catch your breath by resting or doing a relaxed purse lip breathing pattern.  - The less you use it, the better it will work when you need it. - Ok to use up to 2 puffs  every 4 hours if you must but call for immediate appointment if use goes up over your usual need - Don't leave home without it !!  (think of it like the spare tire for your car)   Note your daily symptoms > remember "red flags" for COPD:   >>>Increase in cough >>>increase in sputum production >>>increase in shortness of breath or activity  intolerance.   If you notice these symptoms, please call the office to be seen.    3. Chronic respiratory failure with hypoxia and hypercapnia (HCC)  Continue home trilogy ventilator  Continue oxygen therapy as prescribed  >>>maintain oxygen saturations greater than 88 percent  >>>if unable to maintain oxygen saturations please contact the office  >>>do not smoke with  oxygen  >>>can use nasal saline gel or nasal saline rinses to moisturize nose if oxygen causes dryness  Follow Up:    Return in about 1 week (around 03/25/2020), or if symptoms worsen or fail to improve, for Follow up with Dr. Lamonte Sakai.   Please do your part to reduce the spread of COVID-19:      Reduce your risk of any infection  and COVID19 by using the similar precautions used for avoiding the common cold or flu:  Marland Kitchen Wash your hands often with soap and warm water for at least 20 seconds.  If soap and water are not readily available, use an alcohol-based hand sanitizer with at least 60% alcohol.  . If coughing or sneezing, cover your mouth and nose by coughing or sneezing into the elbow areas of your shirt or coat, into a tissue or into your sleeve (not your hands). Langley Gauss A MASK when in public  . Avoid shaking hands with others and consider head nods or verbal greetings only. . Avoid touching your eyes, nose, or mouth with unwashed hands.  . Avoid close contact with people who are sick. . Avoid places or events with large numbers of people in one location, like concerts or sporting events. . If you have some symptoms but not all symptoms, continue to monitor at home and seek medical attention if your symptoms worsen. . If you are having a medical emergency, call 911.   ADDITIONAL  [place] / e-Visit: [email or web address]         [facility]: [facility]: [phone]                   [facility]: [phone]     It is flu season:   >>> Best ways to protect herself from the flu: Receive the yearly flu vaccine, practice good hand hygiene washing with soap and also using hand sanitizer when available, eat a nutritious meals, get adequate rest, hydrate appropriately   Please contact the office if your symptoms worsen or you have concerns  that you are not improving.   Thank you for choosing [facility] for your healthcare, and for allowing [place] to partner with you on your healthcare journey. I am thankful to be able to provide care to you today.   Wyn Quaker FNP-C

## 2020-03-18 NOTE — Assessment & Plan Note (Signed)
Continued COPD exacerbation Status post doxycycline on 03/06/2020  Plan: Start Levaquin as originally prescribed Continue prednisone at 30 mg, work to get to 20 mg daily by next office visit next week

## 2020-03-18 NOTE — Assessment & Plan Note (Signed)
Plan: Continue Trelegy Ellipta Continue albuterol Continue oxygen therapy Continue home trilogy ventilator, use every night Start Levaquin as discussed Close follow-up in 1 week to further review medications and ensure symptomatic improvement

## 2020-03-18 NOTE — Assessment & Plan Note (Signed)
Plan: Continue trilogy vent at night Continue oxygen therapy Maintain oxygen saturations above 88 to 90% Close follow-up with our office within 7 days for an office evaluation

## 2020-03-19 ENCOUNTER — Telehealth: Payer: Self-pay | Admitting: Pulmonary Disease

## 2020-03-19 NOTE — Telephone Encounter (Signed)
Called and spoke with patient Let her know to start levaquin and take 1 tablet for the next 7 days.  Patient voiced understanding. Nothing further needed at this time.

## 2020-03-20 ENCOUNTER — Telehealth: Payer: Self-pay | Admitting: Pulmonary Disease

## 2020-03-20 NOTE — Telephone Encounter (Signed)
ATC patient per AVS from televisit with BPM to schedule 1 week follow up unable to reach patient , left message to call back

## 2020-03-26 NOTE — Progress Notes (Deleted)
@Patient  ID: Megan [name], female    DOB: 08/09/1951, 69 y.o.   MRN: [id number]  No chief complaint on file.   Referring provider: [name], MD  HPI:  69 year old female former smoker followed in our office for COPD  Past medical history: Hypertension, protein calorie malnutrition, anxiety, PTSD, fibromyalgia, dysphagia Smoking history: Former smoker.  Quit 2001.  30-pack-year smoking history Maintenance: Trelegy Ellipta  Patient of Dr. Lamonte Sakai  03/26/2020  - Visit   69 year old female former smoker followed in our office for COPD.  Patient completed a 1 week follow-up with our office after a virtual visit last week.  Patient was started on Levaquin for suspected COPD exacerbation that did not respond to doxycycline.  At last virtual visit patient was evaluated by Dr. Lamonte Sakai and placed on cyclical antibiotics of a rotating schedule of doxycycline as well as azithromycin.  Questionaires / Pulmonary Flowsheets:   ACT:  No flowsheet data found.  MMRC: No flowsheet data found.  Epworth:  No flowsheet data found.  Tests:   11/04/2018-chest x-ray-emphysema with large lung volumes, old granulomatous disease, bone demineralization  FENO:  No results found for: NITRICOXIDE  PFT: No flowsheet data found.  WALK:  SIX MIN WALK 08/07/2019 01/24/2017  Supplimental Oxygen during Test? (L/min) Yes No  O2 Flow Rate 2 -  Type Continuous -  Tech Comments: steady walk, desat to 88% after 1st lap, 2L needed to stay above 88% TA/CMA Pt walked from scale to room and dropped oxygen down to 86.    Imaging: No results found.  Lab Results:  CBC    Component Value Date/Time   WBC 12.4 (H) 04/19/2019 1537   RBC 3.93 04/19/2019 1537   HGB 11.5 (L) 04/19/2019 1537   HCT 36.8 04/19/2019 1537   PLT 436.0 (H) 04/19/2019 1537   MCV 93.6 04/19/2019 1537   MCH 29.0 11/08/2018 0240   MCHC 31.3 04/19/2019 1537   RDW 14.4 04/19/2019 1537   LYMPHSABS 0.7 04/19/2019 1537   MONOABS 0.1  04/19/2019 1537   EOSABS 0.0 04/19/2019 1537   BASOSABS 0.1 04/19/2019 1537    BMET    Component Value Date/Time   NA 137 04/19/2019 1537   K 4.7 04/19/2019 1537   CL 91 (L) 04/19/2019 1537   CO2 41 (H) 04/19/2019 1537   GLUCOSE 157 (H) 04/19/2019 1537   BUN 14 04/19/2019 1537   CREATININE 0.76 04/19/2019 1537   CALCIUM 9.1 04/19/2019 1537   GFRNONAA >60 11/08/2018 0240   GFRAA >60 11/08/2018 0240    BNP    Component Value Date/Time   BNP 184.2 (H) 02/12/2018 2052    ProBNP No results found for: PROBNP  Specialty Problems      Pulmonary Problems   Multiple pulmonary nodules determined by computed tomography of lung   COPD (chronic obstructive pulmonary disease) (HCC)   Chronic respiratory failure with hypoxia and hypercapnia (HCC)   Acute on Chronic respiratory failure with hypoxia and SEVERE Hypercapnia (HCC)   COPD, 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 Penicillins Rash and Hives    Has patient had a PCN reaction causing immediate rash, facial/tongue/throat swelling, SOB or lightheadedness with hypotension: No Has patient had a PCN reaction causing severe rash involving mucus membranes or skin necrosis:NO Has patient had a PCN reaction that required hospitalization No  Has patient had a PCN reaction occurring within the last 10 years:NO If all of the above answers are "NO", then may proceed with Cephalosporin use.    Immunization History  Administered Date(s) Administered  . Fluad Quad(high Dose 65+) 08/07/2019  . HiB (PRP-OMP) 09/10/2015  . Influenza, High Dose Seasonal PF 08/27/2016, 07/25/2018  . Influenza,inj,Quad PF,6+ Mos 09/16/2017  . Pneumococcal Conjugate-13 09/16/2017  . Pneumococcal Polysaccharide-23 04/19/2019  . Tdap 09/16/2017    Past Medical History:  Diagnosis Date  . Anemia   .  Anxiety state 08/20/2015  . CAP (community acquired pneumonia)   . CHF (congestive heart failure) ([place])   . COPD (chronic obstructive pulmonary disease) ([place])   . Depression   . Essential hypertension 08/19/2015  . GERD (gastroesophageal reflux disease)   . Headache   . History of hiatal hernia   . Myocardial infarction ([place])   . Shortness of breath dyspnea     Tobacco History: Social History   Tobacco Use  Smoking Status Former Smoker  . Packs/day: 1.00  . Years: 30.00  . Pack years: 30.00  . Types: Cigarettes  . Quit date: 08/18/2000  . Years since quitting: 19.6  Smokeless Tobacco Never Used   Counseling given: Not Answered   Continue to not smoke  Outpatient Encounter Medications as of 03/27/2020  Medication Sig  . albuterol (VENTOLIN HFA) 108 (90 Base) MCG/ACT inhaler INHALE 2 PUFFS BY MOUTH EVERY 6 HOURS AS NEEDED FOR WHEEZING OR SHORTNESS OF BREATH  . ALPRAZolam (XANAX) 0.25 MG tablet 1 tab by mouth twice per day as needed  . aspirin EC 81 MG tablet Take 81 mg by mouth daily.  Marland Kitchen azelastine (ASTELIN) 0.1 % nasal spray Place 1 spray into both nostrils 2 (two) times daily. Use in each nostril as directed  . azithromycin (ZITHROMAX) 250 MG tablet Take 1 tablet (250 mg total) by mouth daily. Take two tablets day one, then one a day. Alternate months.  . butalbital-acetaminophen-caffeine (FIORICET, ESGIC) 50-325-40 MG tablet Take 1 tablet by mouth 2 (two) times daily as needed for headache.  . diltiazem (CARDIZEM CD) 240 MG 24 hr capsule Take 1 capsule (240 mg total) by mouth daily.  Marland Kitchen doxycycline (VIBRA-TABS) 100 MG tablet Take 1 tablet (100 mg total) by mouth 2 (two) times daily. Take the first week in May, July, September, November, January, and March  . feeding supplement, ENSURE ENLIVE, (ENSURE ENLIVE) LIQD Take 237 mLs by mouth 4 (four) times daily.  . fluticasone (FLONASE) 50 MCG/ACT nasal spray SHAKE LIQUID AND USE 2 SPRAYS IN EACH NOSTRIL DAILY  . guaiFENesin  (MUCINEX) 600 MG 12 hr tablet Take 2 tablets (1,200 mg total) by mouth 2 (two) times daily.  Marland Kitchen ipratropium-albuterol (DUONEB) 0.5-2.5 (3) MG/3ML SOLN Take 3 mLs by nebulization every 6 (six) hours as needed.  Marland Kitchen levofloxacin (LEVAQUIN) 500 MG tablet Take 1 tablet (500 mg total) by mouth daily.  Marland Kitchen lidocaine (LIDODERM) 5 % Place 1 patch onto the skin daily. Remove & Discard patch within 12 hours or as directed by MD  . loratadine (CLARITIN) 10 MG tablet Take 1 tablet (10 mg total) by mouth daily.  . metoprolol tartrate (LOPRESSOR) 25 MG tablet TAKE 1/2 TABLET(12.5 MG) BY MOUTH TWICE DAILY  . montelukast (SINGULAIR) 10 MG tablet Take 1 tablet (10 mg total) by mouth at bedtime.  . Multiple Vitamin (TAB-A-VITE/BETA CAROTENE) TABS Take 1 tablet by mouth daily.  . pantoprazole (PROTONIX) 40 MG tablet TAKE 1 TABLET(40 MG) BY MOUTH  DAILY  . polyethylene glycol (MIRALAX / GLYCOLAX) packet Take 17 g by mouth 2 (two) times daily. (Patient taking differently: Take 17 g by mouth daily as needed for mild constipation. )  . predniSONE (DELTASONE) 20 MG tablet Take 1 tablet (20 mg total) by mouth daily with breakfast.  . predniSONE (DELTASONE) 5 MG tablet Take 3 tablets (15 mg total) by mouth daily with breakfast.  . senna-docusate (SENOKOT-S) 8.6-50 MG tablet Take 1 tablet by mouth 2 (two) times daily. (Patient taking differently: Take 1 tablet by mouth at bedtime as needed for mild constipation. )  . tiZANidine (ZANAFLEX) 2 MG tablet Take 1 tablet (2 mg total) by mouth every 6 (six) hours as needed for muscle spasms.  . TRELEGY ELLIPTA 100-62.5-25 MCG/INH AEPB INHALE 1 PUFF INTO THE LUNGS DAILY  . Vitamin D, Ergocalciferol, (DRISDOL) 1.25 MG (50000 UT) CAPS capsule Take 1 capsule (50,000 Units total) by mouth every 7 (seven) days.   No facility-administered encounter medications on file as of 03/27/2020.     Review of Systems  Review of Systems   Physical Exam  There were no vitals taken for this  visit.  Wt Readings from Last 5 Encounters:  11/07/19 90 lb (40.8 kg)  08/07/19 89 lb 12.8 oz (40.7 kg)  04/19/19 93 lb (42.2 kg)  01/18/19 89 lb (40.4 kg)  11/04/18 89 lb 11.6 oz (40.7 kg)    BMI Readings from Last 5 Encounters:  11/07/19 15.94 kg/m  08/07/19 16.16 kg/m  04/19/19 17.01 kg/m  01/18/19 16.28 kg/m  11/04/18 16.41 kg/m     Physical Exam    Assessment & Plan:   No problem-specific Assessment & Plan notes found for this encounter.    No follow-ups on file.   Lauraine Rinne, NP 03/26/2020   This appointment required *** minutes of patient care (this includes precharting, chart review, review of results, face-to-face care, etc.).

## 2020-03-27 ENCOUNTER — Ambulatory Visit: Payer: Medicare Other | Admitting: Pulmonary Disease

## 2020-04-03 NOTE — Progress Notes (Deleted)
@Patient  ID: Megan [name], female    DOB: Jan 30, 1951, 69 y.o.   MRN: [id number]  No chief complaint on file.   Referring provider: [name], MD  HPI:  69 year old female former smoker followed in our office for COPD  Past medical history: Hypertension, protein calorie malnutrition, anxiety, PTSD, fibromyalgia, dysphagia Smoking history: Former smoker.  Quit 2001.  30-pack-year smoking history Maintenance: Trelegy Ellipta  Patient of Dr. Lamonte Sakai  04/03/2020  - Visit   69 year old female former smoker followed in our office for COPD.  Patient completed a 1 week follow-up with our office after a virtual visit last week.  Patient was started on Levaquin for suspected COPD exacerbation that did not respond to doxycycline.  At last virtual visit patient was evaluated by Dr. Lamonte Sakai and placed on cyclical antibiotics of a rotating schedule of doxycycline as well as azithromycin.  Questionaires / Pulmonary Flowsheets:   ACT:  No flowsheet data found.  MMRC: No flowsheet data found.  Epworth:  No flowsheet data found.  Tests:   11/04/2018-chest x-ray-emphysema with large lung volumes, old granulomatous disease, bone demineralization  FENO:  No results found for: NITRICOXIDE  PFT: No flowsheet data found.  WALK:  SIX MIN WALK 08/07/2019 01/24/2017  Supplimental Oxygen during Test? (L/min) Yes No  O2 Flow Rate 2 -  Type Continuous -  Tech Comments: steady walk, desat to 88% after 1st lap, 2L needed to stay above 88% TA/CMA Pt walked from scale to room and dropped oxygen down to 86.    Imaging: No results found.  Lab Results:  CBC    Component Value Date/Time   WBC 12.4 (H) 04/19/2019 1537   RBC 3.93 04/19/2019 1537   HGB 11.5 (L) 04/19/2019 1537   HCT 36.8 04/19/2019 1537   PLT 436.0 (H) 04/19/2019 1537   MCV 93.6 04/19/2019 1537   MCH 29.0 11/08/2018 0240   MCHC 31.3 04/19/2019 1537   RDW 14.4 04/19/2019 1537   LYMPHSABS 0.7 04/19/2019 1537   MONOABS 0.1  04/19/2019 1537   EOSABS 0.0 04/19/2019 1537   BASOSABS 0.1 04/19/2019 1537    BMET    Component Value Date/Time   NA 137 04/19/2019 1537   K 4.7 04/19/2019 1537   CL 91 (L) 04/19/2019 1537   CO2 41 (H) 04/19/2019 1537   GLUCOSE 157 (H) 04/19/2019 1537   BUN 14 04/19/2019 1537   CREATININE 0.76 04/19/2019 1537   CALCIUM 9.1 04/19/2019 1537   GFRNONAA >60 11/08/2018 0240   GFRAA >60 11/08/2018 0240    BNP    Component Value Date/Time   BNP 184.2 (H) 02/12/2018 2052    ProBNP No results found for: PROBNP  Specialty Problems      Pulmonary Problems   Multiple pulmonary nodules determined by computed tomography of lung   COPD (chronic obstructive pulmonary disease) (HCC)   Chronic respiratory failure with hypoxia and hypercapnia (HCC)   Acute on Chronic respiratory failure with hypoxia and SEVERE Hypercapnia (HCC)   COPD, 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 Penicillins Rash and Hives    Has patient had a PCN reaction causing immediate rash, facial/tongue/throat swelling, SOB or lightheadedness with hypotension: No Has patient had a PCN reaction causing severe rash involving mucus membranes or skin necrosis:NO Has patient had a PCN reaction that required hospitalization No  Has patient had a PCN reaction occurring within the last 10 years:NO If all of the above answers are "NO", then may proceed with Cephalosporin use.    Immunization History  Administered Date(s) Administered  . Fluad Quad(high Dose 65+) 08/07/2019  . HiB (PRP-OMP) 09/10/2015  . Influenza, High Dose Seasonal PF 08/27/2016, 07/25/2018  . Influenza,inj,Quad PF,6+ Mos 09/16/2017  . Pneumococcal Conjugate-13 09/16/2017  . Pneumococcal Polysaccharide-23 04/19/2019  . Tdap 09/16/2017    Past Medical History:  Diagnosis Date  . Anemia   .  Anxiety state 08/20/2015  . CAP (community acquired pneumonia)   . CHF (congestive heart failure) ([place])   . COPD (chronic obstructive pulmonary disease) ([place])   . Depression   . Essential hypertension 08/19/2015  . GERD (gastroesophageal reflux disease)   . Headache   . History of hiatal hernia   . Myocardial infarction ([place])   . Shortness of breath dyspnea     Tobacco History: Social History   Tobacco Use  Smoking Status Former Smoker  . Packs/day: 1.00  . Years: 30.00  . Pack years: 30.00  . Types: Cigarettes  . Quit date: 08/18/2000  . Years since quitting: 19.6  Smokeless Tobacco Never Used   Counseling given: Not Answered   Continue to not smoke  Outpatient Encounter Medications as of 04/04/2020  Medication Sig  . albuterol (VENTOLIN HFA) 108 (90 Base) MCG/ACT inhaler INHALE 2 PUFFS BY MOUTH EVERY 6 HOURS AS NEEDED FOR WHEEZING OR SHORTNESS OF BREATH  . ALPRAZolam (XANAX) 0.25 MG tablet 1 tab by mouth twice per day as needed  . aspirin EC 81 MG tablet Take 81 mg by mouth daily.  Marland Kitchen azelastine (ASTELIN) 0.1 % nasal spray Place 1 spray into both nostrils 2 (two) times daily. Use in each nostril as directed  . azithromycin (ZITHROMAX) 250 MG tablet Take 1 tablet (250 mg total) by mouth daily. Take two tablets day one, then one a day. Alternate months.  . butalbital-acetaminophen-caffeine (FIORICET, ESGIC) 50-325-40 MG tablet Take 1 tablet by mouth 2 (two) times daily as needed for headache.  . diltiazem (CARDIZEM CD) 240 MG 24 hr capsule Take 1 capsule (240 mg total) by mouth daily.  Marland Kitchen doxycycline (VIBRA-TABS) 100 MG tablet Take 1 tablet (100 mg total) by mouth 2 (two) times daily. Take the first week in May, July, September, November, January, and March  . feeding supplement, ENSURE ENLIVE, (ENSURE ENLIVE) LIQD Take 237 mLs by mouth 4 (four) times daily.  . fluticasone (FLONASE) 50 MCG/ACT nasal spray SHAKE LIQUID AND USE 2 SPRAYS IN EACH NOSTRIL DAILY  . guaiFENesin  (MUCINEX) 600 MG 12 hr tablet Take 2 tablets (1,200 mg total) by mouth 2 (two) times daily.  Marland Kitchen ipratropium-albuterol (DUONEB) 0.5-2.5 (3) MG/3ML SOLN Take 3 mLs by nebulization every 6 (six) hours as needed.  Marland Kitchen levofloxacin (LEVAQUIN) 500 MG tablet Take 1 tablet (500 mg total) by mouth daily.  Marland Kitchen lidocaine (LIDODERM) 5 % Place 1 patch onto the skin daily. Remove & Discard patch within 12 hours or as directed by MD  . loratadine (CLARITIN) 10 MG tablet Take 1 tablet (10 mg total) by mouth daily.  . metoprolol tartrate (LOPRESSOR) 25 MG tablet TAKE 1/2 TABLET(12.5 MG) BY MOUTH TWICE DAILY  . montelukast (SINGULAIR) 10 MG tablet Take 1 tablet (10 mg total) by mouth at bedtime.  . Multiple Vitamin (TAB-A-VITE/BETA CAROTENE) TABS Take 1 tablet by mouth daily.  . pantoprazole (PROTONIX) 40 MG tablet TAKE 1 TABLET(40 MG) BY MOUTH  DAILY  . polyethylene glycol (MIRALAX / GLYCOLAX) packet Take 17 g by mouth 2 (two) times daily. (Patient taking differently: Take 17 g by mouth daily as needed for mild constipation. )  . predniSONE (DELTASONE) 20 MG tablet Take 1 tablet (20 mg total) by mouth daily with breakfast.  . predniSONE (DELTASONE) 5 MG tablet Take 3 tablets (15 mg total) by mouth daily with breakfast.  . senna-docusate (SENOKOT-S) 8.6-50 MG tablet Take 1 tablet by mouth 2 (two) times daily. (Patient taking differently: Take 1 tablet by mouth at bedtime as needed for mild constipation. )  . tiZANidine (ZANAFLEX) 2 MG tablet Take 1 tablet (2 mg total) by mouth every 6 (six) hours as needed for muscle spasms.  . TRELEGY ELLIPTA 100-62.5-25 MCG/INH AEPB INHALE 1 PUFF INTO THE LUNGS DAILY  . Vitamin D, Ergocalciferol, (DRISDOL) 1.25 MG (50000 UT) CAPS capsule Take 1 capsule (50,000 Units total) by mouth every 7 (seven) days.   No facility-administered encounter medications on file as of 04/04/2020.     Review of Systems  Review of Systems   Physical Exam  There were no vitals taken for this  visit.  Wt Readings from Last 5 Encounters:  11/07/19 90 lb (40.8 kg)  08/07/19 89 lb 12.8 oz (40.7 kg)  04/19/19 93 lb (42.2 kg)  01/18/19 89 lb (40.4 kg)  11/04/18 89 lb 11.6 oz (40.7 kg)    BMI Readings from Last 5 Encounters:  11/07/19 15.94 kg/m  08/07/19 16.16 kg/m  04/19/19 17.01 kg/m  01/18/19 16.28 kg/m  11/04/18 16.41 kg/m     Physical Exam    Assessment & Plan:   No problem-specific Assessment & Plan notes found for this encounter.    No follow-ups on file.   Lauraine Rinne, NP 04/03/2020   This appointment required *** minutes of patient care (this includes precharting, chart review, review of results, face-to-face care, etc.).

## 2020-04-04 ENCOUNTER — Ambulatory Visit: Payer: Medicare Other | Admitting: Pulmonary Disease

## 2020-05-05 ENCOUNTER — Other Ambulatory Visit: Payer: Self-pay | Admitting: Primary Care

## 2020-05-06 NOTE — Telephone Encounter (Signed)
Pt is requesting refill on predisone lov 03/18/20 no f/u appt scheduled

## 2020-05-23 ENCOUNTER — Telehealth: Payer: Self-pay | Admitting: Pulmonary Disease

## 2020-05-23 NOTE — Telephone Encounter (Signed)
Left message for patient to call back  

## 2020-05-23 NOTE — Telephone Encounter (Signed)
05/23/2020  Based off patient's history of severe chronic lung disease I would recommend that she does obtain the Covid vaccine.  Wyn Quaker, FNP

## 2020-05-23 NOTE — Telephone Encounter (Signed)
Patient would like to know if and when would be okay to get her covid vaccine.Or if it is safe for her to get the vaccine.  Aaron Edelman please advise.

## 2020-05-26 NOTE — Telephone Encounter (Signed)
Yes I agree -  it is ok to proceed with the COVID vaccine at this point

## 2020-05-26 NOTE — Telephone Encounter (Signed)
Patient calling back - I told her it was okay to do covid vaccine, but we need to address cough and whether she should get the vaccine while she is having this cough- pt can be reached at [phone] - this is her daughter's phone - ok to leave detailed message-pr

## 2020-05-26 NOTE — Telephone Encounter (Signed)
Spoke with the pt and notified of recs per Dr Lamonte Sakai and she verbalized understanding.

## 2020-05-26 NOTE — Telephone Encounter (Signed)
Spoke with the pt  She states she just finished doxy 2 days ago and now she is coughing up light yellow sputum  No fever, body aches, feels well in general  Wants to know when she should get the vaccine  Please advise thanks

## 2020-05-26 NOTE — Telephone Encounter (Signed)
LMTCB x2 for pt. We also need to address the pt's original message of coughing up yellow mucus despite being on an antibiotic regimen.

## 2020-06-10 ENCOUNTER — Ambulatory Visit: Payer: Medicare Other | Admitting: Pulmonary Disease

## 2020-06-10 ENCOUNTER — Inpatient Hospital Stay (HOSPITAL_COMMUNITY)
Admission: EM | Admit: 2020-06-10 | Discharge: 2020-06-18 | DRG: 177 | Disposition: A | Discharge status: Skilled Nursing Facility | Payer: Medicare Other | Attending: Internal Medicine | Admitting: Internal Medicine

## 2020-06-10 ENCOUNTER — Emergency Department (HOSPITAL_COMMUNITY): Payer: Medicare Other

## 2020-06-10 DIAGNOSIS — R1314 Dysphagia, pharyngoesophageal phase: Secondary | ICD-10-CM | POA: Diagnosis not present

## 2020-06-10 DIAGNOSIS — B370 Candidal stomatitis: Secondary | ICD-10-CM | POA: Diagnosis not present

## 2020-06-10 DIAGNOSIS — Z885 Allergy status to narcotic agent status: Secondary | ICD-10-CM

## 2020-06-10 DIAGNOSIS — J069 Acute upper respiratory infection, unspecified: Secondary | ICD-10-CM | POA: Diagnosis present

## 2020-06-10 DIAGNOSIS — R0602 Shortness of breath: Secondary | ICD-10-CM | POA: Diagnosis not present

## 2020-06-10 DIAGNOSIS — Z818 Family history of other mental and behavioral disorders: Secondary | ICD-10-CM | POA: Diagnosis not present

## 2020-06-10 DIAGNOSIS — J440 Chronic obstructive pulmonary disease with acute lower respiratory infection: Secondary | ICD-10-CM | POA: Diagnosis present

## 2020-06-10 DIAGNOSIS — J441 Chronic obstructive pulmonary disease with (acute) exacerbation: Secondary | ICD-10-CM | POA: Diagnosis present

## 2020-06-10 DIAGNOSIS — R0902 Hypoxemia: Secondary | ICD-10-CM | POA: Diagnosis not present

## 2020-06-10 DIAGNOSIS — R278 Other lack of coordination: Secondary | ICD-10-CM | POA: Diagnosis not present

## 2020-06-10 DIAGNOSIS — R636 Underweight: Secondary | ICD-10-CM | POA: Diagnosis present

## 2020-06-10 DIAGNOSIS — Z79899 Other long term (current) drug therapy: Secondary | ICD-10-CM | POA: Diagnosis not present

## 2020-06-10 DIAGNOSIS — J80 Acute respiratory distress syndrome: Secondary | ICD-10-CM | POA: Diagnosis not present

## 2020-06-10 DIAGNOSIS — J9621 Acute and chronic respiratory failure with hypoxia: Secondary | ICD-10-CM | POA: Diagnosis present

## 2020-06-10 DIAGNOSIS — Z9981 Dependence on supplemental oxygen: Secondary | ICD-10-CM | POA: Diagnosis not present

## 2020-06-10 DIAGNOSIS — E1165 Type 2 diabetes mellitus with hyperglycemia: Secondary | ICD-10-CM | POA: Diagnosis not present

## 2020-06-10 DIAGNOSIS — U071 COVID-19: Principal | ICD-10-CM | POA: Diagnosis present

## 2020-06-10 DIAGNOSIS — J9612 Chronic respiratory failure with hypercapnia: Secondary | ICD-10-CM | POA: Diagnosis present

## 2020-06-10 DIAGNOSIS — R000 Tachycardia, unspecified: Secondary | ICD-10-CM | POA: Diagnosis present

## 2020-06-10 DIAGNOSIS — Z888 Allergy status to other drugs, medicaments and biological substances status: Secondary | ICD-10-CM

## 2020-06-10 DIAGNOSIS — E871 Hypo-osmolality and hyponatremia: Secondary | ICD-10-CM | POA: Diagnosis present

## 2020-06-10 DIAGNOSIS — M2550 Pain in unspecified joint: Secondary | ICD-10-CM | POA: Diagnosis not present

## 2020-06-10 DIAGNOSIS — F4312 Post-traumatic stress disorder, chronic: Secondary | ICD-10-CM

## 2020-06-10 DIAGNOSIS — J9600 Acute respiratory failure, unspecified whether with hypoxia or hypercapnia: Secondary | ICD-10-CM | POA: Diagnosis present

## 2020-06-10 DIAGNOSIS — Y92239 Unspecified place in hospital as the place of occurrence of the external cause: Secondary | ICD-10-CM | POA: Diagnosis not present

## 2020-06-10 DIAGNOSIS — J159 Unspecified bacterial pneumonia: Secondary | ICD-10-CM | POA: Diagnosis present

## 2020-06-10 DIAGNOSIS — K219 Gastro-esophageal reflux disease without esophagitis: Secondary | ICD-10-CM | POA: Diagnosis present

## 2020-06-10 DIAGNOSIS — J90 Pleural effusion, not elsewhere classified: Secondary | ICD-10-CM | POA: Diagnosis not present

## 2020-06-10 DIAGNOSIS — F411 Generalized anxiety disorder: Secondary | ICD-10-CM | POA: Diagnosis present

## 2020-06-10 DIAGNOSIS — Z681 Body mass index (BMI) 19 or less, adult: Secondary | ICD-10-CM

## 2020-06-10 DIAGNOSIS — J9611 Chronic respiratory failure with hypoxia: Secondary | ICD-10-CM | POA: Diagnosis present

## 2020-06-10 DIAGNOSIS — Z880 Allergy status to penicillin: Secondary | ICD-10-CM | POA: Diagnosis not present

## 2020-06-10 DIAGNOSIS — Z7952 Long term (current) use of systemic steroids: Secondary | ICD-10-CM | POA: Diagnosis not present

## 2020-06-10 DIAGNOSIS — M6259 Muscle wasting and atrophy, not elsewhere classified, multiple sites: Secondary | ICD-10-CM | POA: Diagnosis not present

## 2020-06-10 DIAGNOSIS — R1319 Other dysphagia: Secondary | ICD-10-CM | POA: Diagnosis not present

## 2020-06-10 DIAGNOSIS — I10 Essential (primary) hypertension: Secondary | ICD-10-CM | POA: Diagnosis present

## 2020-06-10 DIAGNOSIS — J9811 Atelectasis: Secondary | ICD-10-CM | POA: Diagnosis not present

## 2020-06-10 DIAGNOSIS — K5641 Fecal impaction: Secondary | ICD-10-CM | POA: Diagnosis not present

## 2020-06-10 DIAGNOSIS — I252 Old myocardial infarction: Secondary | ICD-10-CM | POA: Diagnosis not present

## 2020-06-10 DIAGNOSIS — T380X5A Adverse effect of glucocorticoids and synthetic analogues, initial encounter: Secondary | ICD-10-CM | POA: Diagnosis not present

## 2020-06-10 DIAGNOSIS — J9622 Acute and chronic respiratory failure with hypercapnia: Secondary | ICD-10-CM | POA: Diagnosis present

## 2020-06-10 DIAGNOSIS — Z87891 Personal history of nicotine dependence: Secondary | ICD-10-CM | POA: Diagnosis not present

## 2020-06-10 DIAGNOSIS — J439 Emphysema, unspecified: Secondary | ICD-10-CM | POA: Diagnosis not present

## 2020-06-10 DIAGNOSIS — R21 Rash and other nonspecific skin eruption: Secondary | ICD-10-CM | POA: Diagnosis not present

## 2020-06-10 DIAGNOSIS — Q396 Congenital diverticulum of esophagus: Secondary | ICD-10-CM | POA: Diagnosis not present

## 2020-06-10 DIAGNOSIS — Z7982 Long term (current) use of aspirin: Secondary | ICD-10-CM | POA: Diagnosis not present

## 2020-06-10 DIAGNOSIS — D649 Anemia, unspecified: Secondary | ICD-10-CM | POA: Diagnosis present

## 2020-06-10 DIAGNOSIS — Z7401 Bed confinement status: Secondary | ICD-10-CM | POA: Diagnosis not present

## 2020-06-10 DIAGNOSIS — G4700 Insomnia, unspecified: Secondary | ICD-10-CM | POA: Diagnosis present

## 2020-06-10 DIAGNOSIS — J1282 Pneumonia due to coronavirus disease 2019: Secondary | ICD-10-CM | POA: Diagnosis present

## 2020-06-10 DIAGNOSIS — L8990 Pressure ulcer of unspecified site, unspecified stage: Secondary | ICD-10-CM | POA: Insufficient documentation

## 2020-06-10 DIAGNOSIS — R4702 Dysphasia: Secondary | ICD-10-CM | POA: Diagnosis not present

## 2020-06-10 DIAGNOSIS — R112 Nausea with vomiting, unspecified: Secondary | ICD-10-CM

## 2020-06-10 DIAGNOSIS — M6281 Muscle weakness (generalized): Secondary | ICD-10-CM | POA: Diagnosis not present

## 2020-06-10 DIAGNOSIS — Z8249 Family history of ischemic heart disease and other diseases of the circulatory system: Secondary | ICD-10-CM

## 2020-06-10 DIAGNOSIS — E43 Unspecified severe protein-calorie malnutrition: Secondary | ICD-10-CM | POA: Diagnosis not present

## 2020-06-10 DIAGNOSIS — I700 Atherosclerosis of aorta: Secondary | ICD-10-CM | POA: Diagnosis not present

## 2020-06-10 DIAGNOSIS — J9601 Acute respiratory failure with hypoxia: Secondary | ICD-10-CM | POA: Diagnosis not present

## 2020-06-10 LAB — BASIC METABOLIC PANEL
Anion gap: 17 — ABNORMAL HIGH (ref 5–15)
BUN: 16 mg/dL (ref 8–23)
CO2: 33 mmol/L — ABNORMAL HIGH (ref 22–32)
Calcium: 8.2 mg/dL — ABNORMAL LOW (ref 8.9–10.3)
Chloride: 82 mmol/L — ABNORMAL LOW (ref 98–111)
Creatinine, Ser: 1.01 mg/dL — ABNORMAL HIGH (ref 0.44–1.00)
GFR calc Af Amer: >60 mL/min (ref >60)
GFR calc non Af Amer: 58 mL/min — ABNORMAL LOW (ref >60)
Glucose, Bld: 60 mg/dL — ABNORMAL LOW (ref 70–99)
Potassium: 4 mmol/L (ref 3.5–5.1)
Sodium: 132 mmol/L — ABNORMAL LOW (ref 135–145)

## 2020-06-10 LAB — FIBRINOGEN: Fibrinogen: 688 mg/dL — ABNORMAL HIGH (ref 210–475)

## 2020-06-10 LAB — CBC
HCT: 39.8 % (ref 36.0–46.0)
Hemoglobin: 11.9 g/dL — ABNORMAL LOW (ref 12.0–15.0)
MCH: 29.5 pg (ref 26.0–34.0)
MCHC: 29.9 g/dL — ABNORMAL LOW (ref 30.0–36.0)
MCV: 98.5 fL (ref 80.0–100.0)
Platelets: 354 10*3/uL (ref 150–400)
RBC: 4.04 MIL/uL (ref 3.87–5.11)
RDW: 12.7 % (ref 11.5–15.5)
WBC: 6.3 10*3/uL (ref 4.0–10.5)
nRBC: 0 % (ref 0.0–0.2)

## 2020-06-10 LAB — D-DIMER, QUANTITATIVE: D-Dimer, Quant: 2.78 ug/mL-FEU — ABNORMAL HIGH (ref 0.00–0.50)

## 2020-06-10 LAB — FERRITIN: Ferritin: 208 ng/mL (ref 11–307)

## 2020-06-10 LAB — PROCALCITONIN: Procalcitonin: 2.77 ng/mL

## 2020-06-10 LAB — TROPONIN I (HIGH SENSITIVITY)
Troponin I (High Sensitivity): 27 ng/L — ABNORMAL HIGH (ref <18)
Troponin I (High Sensitivity): 26 ng/L — ABNORMAL HIGH (ref <18)

## 2020-06-10 LAB — SARS CORONAVIRUS 2 BY RT PCR (HOSPITAL ORDER, PERFORMED IN ~~LOC~~ HOSPITAL LAB): SARS Coronavirus 2: POSITIVE — AB

## 2020-06-10 LAB — LACTATE DEHYDROGENASE: LDH: 689 U/L — ABNORMAL HIGH (ref 98–192)

## 2020-06-10 LAB — C-REACTIVE PROTEIN: CRP: 19 mg/dL — ABNORMAL HIGH (ref <1.0)

## 2020-06-10 LAB — TRIGLYCERIDES: Triglycerides: 193 mg/dL — ABNORMAL HIGH (ref <150)

## 2020-06-10 LAB — CBG MONITORING, ED: Glucose-Capillary: 75 mg/dL (ref 70–99)

## 2020-06-10 LAB — LACTIC ACID, PLASMA: Lactic Acid, Venous: 1.2 mmol/L (ref 0.5–1.9)

## 2020-06-10 MED ORDER — SODIUM CHLORIDE 0.9 % IV SOLN
100.0000 mg | Freq: Every day | INTRAVENOUS | Status: DC
  Filled 2020-06-10: qty 20

## 2020-06-10 MED ORDER — SODIUM CHLORIDE 0.9 % IV SOLN
200.0000 mg | Freq: Once | INTRAVENOUS | Status: AC
  Administered 2020-06-11: 200 mg via INTRAVENOUS
  Filled 2020-06-10: qty 200

## 2020-06-10 MED ORDER — METHYLPREDNISOLONE SODIUM SUCC 125 MG IJ SOLR
125.0000 mg | Freq: Once | INTRAMUSCULAR | Status: AC
  Administered 2020-06-11: 125 mg via INTRAVENOUS
  Filled 2020-06-10: qty 2

## 2020-06-10 NOTE — ED Provider Notes (Signed)
[facility] EMERGENCY DEPARTMENT Provider Note   CSN: [id number] Arrival date & time: 06/10/20  1542     History No chief complaint on file.   Megan [name] is a 69 y.o. female.  The history is provided by the patient. No language interpreter was used.  Shortness of Breath Severity:  Severe Onset quality:  Gradual Duration:  1 week Timing:  Constant Progression:  Worsening Chronicity:  New Relieved by:  Nothing Worsened by:  Coughing Ineffective treatments:  None tried Associated symptoms: no abdominal pain    Pt reports she has had increasing shortness of breath for the past week.  Pt has not had a covid vaccine.  Pt reports she takes care of Grandchildren who have had a cough.     Past Medical History:  Diagnosis Date  . Anemia   . Anxiety state 08/20/2015  . CAP (community acquired pneumonia)   . CHF (congestive heart failure) ([place])   . COPD (chronic obstructive pulmonary disease) ([place])   . Depression   . Essential hypertension 08/19/2015  . GERD (gastroesophageal reflux disease)   . Headache   . History of hiatal hernia   . Myocardial infarction ([place])   . Shortness of breath dyspnea     Patient Active Problem List   Diagnosis Date Noted  . Nail abnormality 08/07/2019  . COPD exacerbation ([place]) 04/19/2019  . Anemia 04/19/2019  . Urinary frequency 09/22/2018  . Chills without fever 09/22/2018  . Thoracic back pain 07/25/2018  . Low back pain 07/25/2018  . Coccyx pain 07/25/2018  . Fever 05/03/2018  . Food impaction of esophagus   . Abnormal esophagram   . Abdominal pain 04/16/2018  . Epigastric pain 04/16/2018  . Chest pain 04/16/2018  . Chronic respiratory failure with hypoxia ([place]) 04/16/2018  . Dysphagia 04/16/2018  . Odynophagia 04/16/2018  . Allergic rhinitis 03/31/2018  . Malnutrition of moderate degree 02/27/2018  . 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 Palliative care by specialist    . Acute on Chronic respiratory failure with hypoxia and SEVERE Hypercapnia ([place]) 02/13/2018  . Acute pain of right shoulder 01/07/2018  . Flu-like symptoms 12/09/2017  . Fibromyalgia 09/16/2017  . Fatigue 09/16/2017  . Chronic respiratory failure with hypoxia and hypercapnia (HCC)   . Sepsis ([place])   . Hyperglycemia 01/05/2017  . Hair loss 06/22/2016  . Rash 06/22/2016  . Mixed headache 03/19/2016  . COPD (chronic obstructive pulmonary disease) ([place]) 03/19/2016  . Esophageal reflux 09/01/2015  . Goals of care, counseling/discussion   . Palliative care encounter   . Anxiety 08/20/2015  . Chronic post-traumatic stress disorder (PTSD) 08/20/2015  . Multiple pulmonary nodules determined by computed tomography of lung 08/19/2015  . Essential hypertension 08/19/2015  . Protein-calorie malnutrition, severe 08/19/2015    Past Surgical History:  Procedure Laterality Date  . CARDIAC CATHETERIZATION    . CARDIAC CATHETERIZATION N/A 08/26/2015   Procedure: Left Heart Cath and Coronary Angiography;  Surgeon: Jettie Booze, MD;  Location: [facility];  Service: Cardiovascular;  Laterality: N/A;  . ESOPHAGOGASTRODUODENOSCOPY N/A 04/18/2018   Procedure: ESOPHAGOGASTRODUODENOSCOPY (EGD);  Surgeon: Ladene Artist, MD;  Location: [facility] ENDOSCOPY;  Service: Endoscopy;  Laterality: N/A;  . tracheostomy       OB History   No obstetric history on file.     Family History  Problem Relation Age of Onset  . Cirrhosis Mother   . Depression Mother   . Heart disease Father  Social History   Tobacco Use  . Smoking status: Former Smoker    Packs/day: 1.00    Years: 30.00    Pack years: 30.00    Types: Cigarettes    Quit date: 08/18/2000    Years since quitting: 19.8  . Smokeless tobacco: Never Used  Substance Use Topics  . Alcohol use: No  . Drug use: No    Home Medications Prior to Admission medications   Medication Sig Start Date End Date Taking? Authorizing Provider    albuterol (VENTOLIN HFA) 108 (90 Base) MCG/ACT inhaler INHALE 2 PUFFS BY MOUTH EVERY 6 HOURS AS NEEDED FOR WHEEZING OR SHORTNESS OF BREATH 11/07/19   [name], MD  ALPRAZolam Duanne Moron) 0.25 MG tablet 1 tab by mouth twice per day as needed 11/12/19   [name], MD  aspirin EC 81 MG tablet Take 81 mg by mouth daily.    [provider]  azelastine (ASTELIN) 0.1 % nasal spray Place 1 spray into both nostrils 2 (two) times daily. Use in each nostril as directed 06/12/19   Martyn Ehrich, NP  azithromycin (ZITHROMAX) 250 MG tablet Take 1 tablet (250 mg total) by mouth daily. Take two tablets day one, then one a day. Alternate months. 03/17/20   Collene Gobble, MD  butalbital-acetaminophen-caffeine (FIORICET, ESGIC) [phone] MG tablet Take 1 tablet by mouth 2 (two) times daily as needed for headache. 02/10/18   [name], MD  diltiazem (CARDIZEM CD) 240 MG 24 hr capsule Take 1 capsule (240 mg total) by mouth daily. 12/07/19   [name], MD  doxycycline (VIBRA-TABS) 100 MG tablet Take 1 tablet (100 mg total) by mouth 2 (two) times daily. Take the first week in May, July, September, November, January, and March 03/17/20   Collene Gobble, MD  feeding supplement, ENSURE ENLIVE, (ENSURE ENLIVE) LIQD Take 237 mLs by mouth 4 (four) times daily. 01/11/17   Eugenie Filler, MD  fluticasone [facility]) 50 MCG/ACT nasal spray SHAKE LIQUID AND USE 2 SPRAYS IN [facility] NOSTRIL DAILY 07/25/19   Collene Gobble, MD  guaiFENesin (MUCINEX) 600 MG 12 hr tablet Take 2 tablets (1,200 mg total) by mouth 2 (two) times daily. 12/10/19   Martyn Ehrich, NP  ipratropium-albuterol (DUONEB) 0.5-2.5 (3) MG/3ML SOLN Take 3 mLs by nebulization every 6 (six) hours as needed. 03/08/19   Martyn Ehrich, NP  levofloxacin (LEVAQUIN) 500 MG tablet Take 1 tablet (500 mg total) by mouth daily. 03/14/20   Martyn Ehrich, NP  lidocaine (LIDODERM) 5 % Place 1 patch onto the skin daily. Remove & Discard patch within 12 hours  or as directed by MD 12/10/19   Martyn Ehrich, NP  loratadine (CLARITIN) 10 MG tablet Take 1 tablet (10 mg total) by mouth daily. 11/14/19   [name], MD  metoprolol tartrate (LOPRESSOR) 25 MG tablet TAKE 1/2 TABLET(12.5 MG) BY MOUTH TWICE DAILY 12/07/19   [name], MD  montelukast (SINGULAIR) 10 MG tablet Take 1 tablet (10 mg total) by mouth at bedtime. 09/22/18   Martyn Ehrich, NP  Multiple Vitamin (TAB-A-VITE/BETA CAROTENE) TABS Take 1 tablet by mouth daily. 01/01/14   [provider]  pantoprazole (PROTONIX) 40 MG tablet TAKE 1 TABLET(40 MG) BY MOUTH DAILY 12/10/19   [name], MD  polyethylene glycol [facility] Floria Raveling) packet Take 17 g by mouth 2 (two) times daily. Patient taking differently: Take 17 g by mouth daily as needed for mild  constipation.  01/11/17   Eugenie Filler, MD  predniSONE (DELTASONE) 20 MG tablet TAKE 1 TABLET BY MOUTH EVERY DAY WITH BREAKFAST 05/06/20   Martyn Ehrich, NP  predniSONE (DELTASONE) 5 MG tablet Take 3 tablets (15 mg total) by mouth daily with breakfast. 02/27/20   Collene Gobble, MD  senna-docusate (SENOKOT-S) 8.6-50 MG tablet Take 1 tablet by mouth 2 (two) times daily. Patient taking differently: Take 1 tablet by mouth at bedtime as needed for mild constipation.  02/27/18   Roxan Hockey, MD  tiZANidine (ZANAFLEX) 2 MG tablet Take 1 tablet (2 mg total) by mouth every 6 (six) hours as needed for muscle spasms. 04/19/19   [name], MD  TRELEGY ELLIPTA 100-62.5-25 MCG/INH AEPB INHALE 1 PUFF INTO THE LUNGS DAILY 02/26/20   Collene Gobble, MD  Vitamin D, Ergocalciferol, (DRISDOL) 1.25 MG (50000 UT) CAPS capsule Take 1 capsule (50,000 Units total) by mouth every 7 (seven) days. 04/20/19   [name], MD    Allergies    Penicillins  Review of Systems   Review of Systems  Respiratory: Positive for shortness of breath.   Gastrointestinal: Negative for abdominal pain.  All other systems reviewed and are  negative.   Physical Exam Updated Vital Signs BP (!) 128/57   Pulse (!) 112   Temp 98.1 F (36.7 C)   Resp 19   SpO2 93%   Physical Exam Vitals and nursing note reviewed.  Constitutional:      Appearance: She is well-developed.  HENT:     Head: Normocephalic.     Nose: Nose normal.     Mouth/Throat:     Mouth: Mucous membranes are moist.  Cardiovascular:     Rate and Rhythm: Normal rate and regular rhythm.  Pulmonary:     Effort: Pulmonary effort is normal.  Abdominal:     General: Abdomen is flat. There is no distension.  Musculoskeletal:        General: Normal range of motion.     Cervical back: Normal range of motion.  Neurological:     General: No focal deficit present.     Mental Status: She is alert and oriented to person, place, and time.  Psychiatric:        Mood and Affect: Mood normal.     ED Results / Procedures / Treatments   Labs (all labs ordered are listed, but only abnormal results are displayed) Labs Reviewed  SARS CORONAVIRUS 2 BY RT PCR (HOSPITAL ORDER, [facility]) - Abnormal; Notable for the following components:      Result Value   SARS Coronavirus 2 POSITIVE (*)    All other components within normal limits  BASIC METABOLIC PANEL - Abnormal; Notable for the following components:   Sodium 132 (*)    Chloride 82 (*)    CO2 33 (*)    Glucose, Bld 60 (*)    Creatinine, Ser 1.01 (*)    Calcium 8.2 (*)    GFR calc non Af Amer 58 (*)    Anion gap 17 (*)    All other components within normal limits  CBC - Abnormal; Notable for the following components:   Hemoglobin 11.9 (*)    MCHC 29.9 (*)    All other components within normal limits  LACTATE DEHYDROGENASE - Abnormal; Notable for the following components:   LDH 689 (*)    All other components within normal limits  C-REACTIVE PROTEIN - Abnormal; Notable for  the following components:   CRP 19.0 (*)    All other components within normal limits  TRIGLYCERIDES -  Abnormal; Notable for the following components:   Triglycerides 193 (*)    All other components within normal limits  TROPONIN I (HIGH SENSITIVITY) - Abnormal; Notable for the following components:   Troponin I (High Sensitivity) 27 (*)    All other components within normal limits  TROPONIN I (HIGH SENSITIVITY) - Abnormal; Notable for the following components:   Troponin I (High Sensitivity) 26 (*)    All other components within normal limits  CULTURE, BLOOD (ROUTINE X 2)  CULTURE, BLOOD (ROUTINE X 2)  LACTIC ACID, PLASMA  LACTIC ACID, PLASMA  D-DIMER, QUANTITATIVE (NOT AT [facility])  FERRITIN  FIBRINOGEN  PROCALCITONIN  CBG MONITORING, ED    EKG None  Radiology DG Chest 2 View  Result Date: 06/10/2020 CLINICAL DATA:  69 year old female with shortness of breath. Respiratory distress. EXAM: CHEST - 2 VIEW COMPARISON:  Chest radiograph dated 11/04/2018. FINDINGS: There is background of severe emphysema. There are small bilateral pleural effusions. The left lung base atelectasis/scarring versus infiltrate. There is a 2.9 x 2.4 cm focal nodular density in the left upper lobe which is new since the prior radiograph. If there is clinical concern for infection close follow-up after treatment and resolution of the symptoms recommended. If there is no clinical findings of infection, further evaluation with CT is recommended. There is no pneumothorax. The cardiac silhouette is within limits. There is bilateral hilar prominence consistent with pulmonary hypertension. Atherosclerotic calcification of the aorta. No acute osseous pathology. IMPRESSION: 1. Small bilateral pleural effusions and left lung base atelectasis/scarring versus infiltrate. 2. Focal nodular density in the left upper lobe, new since the prior radiograph. Follow-up to resolution or further evaluation with CT is recommended. 3. Aortic Atherosclerosis (ICD10-I70.0) and Emphysema (ICD10-J43.9). Electronically Signed   By: Anner Crete  M.D.   On: 06/10/2020 16:34    Procedures Procedures (including critical care time)  Medications Ordered in ED Medications - No data to display  ED Course  I have reviewed the triage vital signs and the nursing notes.  Pertinent labs & imaging results that were available during my care of the patient were reviewed by me and considered in my medical decision making (see chart for details).    MDM Rules/Calculators/A&P                          MDM:  Pt has a past medical history of COPD.  She is on home 02.  Pt more short of breath.  Pt 02 77% initially.  Pt 100% on nonrebreather. Pt taper to 4 liters on nasal cannula.  Covid is positive Chest xray shows pleural effusions.   Consult for admission  Final Clinical Impression(s) / ED Diagnoses Final diagnoses:  Pneumonia due to COVID-19 virus    Rx / DC Orders ED Discharge Orders    None       Sidney Ace 06/10/20 2138    Truddie Hidden, MD 06/10/20 [phone]

## 2020-06-10 NOTE — ED Triage Notes (Signed)
Pt bib ems from home with increased shob X4 days. Pt with hx copd, wears 2-3L [place] continuously. On scene pt with oxygen saturation of 80. Placed on NRB at 10L with oxygen saturations of 97%. Pt with diminished breath sounds along with rhonchi in all fields. Hx of intubation in the past.  Denies sick contacts. Given 125mg  solumedrol en route. HR 116, 112/68, RR 18.

## 2020-06-10 NOTE — ED Notes (Signed)
Pt is a hard stick, RN attempted blood draw twice without success. RN requested phlebotomy.

## 2020-06-10 NOTE — ED Notes (Signed)
[phone] Megan [name] daughter would like an update please

## 2020-06-11 ENCOUNTER — Encounter (HOSPITAL_COMMUNITY): Payer: Self-pay | Admitting: Internal Medicine

## 2020-06-11 ENCOUNTER — Inpatient Hospital Stay (HOSPITAL_COMMUNITY): Payer: Medicare Other

## 2020-06-11 DIAGNOSIS — J441 Chronic obstructive pulmonary disease with (acute) exacerbation: Secondary | ICD-10-CM

## 2020-06-11 DIAGNOSIS — J9600 Acute respiratory failure, unspecified whether with hypoxia or hypercapnia: Secondary | ICD-10-CM

## 2020-06-11 DIAGNOSIS — U071 COVID-19: Secondary | ICD-10-CM | POA: Diagnosis present

## 2020-06-11 DIAGNOSIS — I10 Essential (primary) hypertension: Secondary | ICD-10-CM

## 2020-06-11 DIAGNOSIS — J069 Acute upper respiratory infection, unspecified: Secondary | ICD-10-CM | POA: Diagnosis present

## 2020-06-11 LAB — TROPONIN I (HIGH SENSITIVITY)
Troponin I (High Sensitivity): 15 ng/L (ref <18)
Troponin I (High Sensitivity): 15 ng/L (ref <18)

## 2020-06-11 LAB — HIV ANTIBODY (ROUTINE TESTING W REFLEX): HIV Screen 4th Generation wRfx: NONREACTIVE

## 2020-06-11 LAB — ABO/RH: ABO/RH(D): O POS

## 2020-06-11 LAB — BRAIN NATRIURETIC PEPTIDE: B Natriuretic Peptide: 58.7 pg/mL (ref 0.0–100.0)

## 2020-06-11 MED ORDER — FLUTICASONE FUROATE-VILANTEROL 100-25 MCG/INH IN AEPB
1.0000 | INHALATION_SPRAY | Freq: Every day | RESPIRATORY_TRACT | Status: DC
  Administered 2020-06-11 – 2020-06-18 (×7): 1 via RESPIRATORY_TRACT
  Filled 2020-06-11: qty 28

## 2020-06-11 MED ORDER — LEVOFLOXACIN IN D5W 750 MG/150ML IV SOLN
750.0000 mg | INTRAVENOUS | Status: AC
  Administered 2020-06-13: 750 mg via INTRAVENOUS
  Filled 2020-06-11: qty 150

## 2020-06-11 MED ORDER — METOPROLOL TARTRATE 12.5 MG HALF TABLET
12.5000 mg | ORAL_TABLET | Freq: Two times a day (BID) | ORAL | Status: DC
  Administered 2020-06-11 – 2020-06-16 (×10): 12.5 mg via ORAL
  Filled 2020-06-11 (×11): qty 1

## 2020-06-11 MED ORDER — FLUTICASONE PROPIONATE 50 MCG/ACT NA SUSP
2.0000 | Freq: Every day | NASAL | Status: DC
  Administered 2020-06-11 – 2020-06-18 (×8): 2 via NASAL
  Filled 2020-06-11: qty 16

## 2020-06-11 MED ORDER — METHYLPREDNISOLONE SODIUM SUCC 40 MG IJ SOLR
20.0000 mg | Freq: Two times a day (BID) | INTRAMUSCULAR | Status: DC
  Administered 2020-06-11 – 2020-06-14 (×6): 20 mg via INTRAVENOUS
  Filled 2020-06-11 (×6): qty 1

## 2020-06-11 MED ORDER — UMECLIDINIUM BROMIDE 62.5 MCG/INH IN AEPB
1.0000 | INHALATION_SPRAY | Freq: Every day | RESPIRATORY_TRACT | Status: DC
  Administered 2020-06-11 – 2020-06-18 (×7): 1 via RESPIRATORY_TRACT
  Filled 2020-06-11: qty 7

## 2020-06-11 MED ORDER — ENOXAPARIN SODIUM 40 MG/0.4ML ~~LOC~~ SOLN
40.0000 mg | SUBCUTANEOUS | Status: DC
  Filled 2020-06-11: qty 0.4

## 2020-06-11 MED ORDER — ENOXAPARIN SODIUM 30 MG/0.3ML ~~LOC~~ SOLN
30.0000 mg | Freq: Every day | SUBCUTANEOUS | Status: DC
  Administered 2020-06-11 – 2020-06-18 (×8): 30 mg via SUBCUTANEOUS
  Filled 2020-06-11 (×8): qty 0.3

## 2020-06-11 MED ORDER — ASPIRIN EC 81 MG PO TBEC
81.0000 mg | DELAYED_RELEASE_TABLET | Freq: Every day | ORAL | Status: DC
  Administered 2020-06-11 – 2020-06-18 (×8): 81 mg via ORAL
  Filled 2020-06-11 (×8): qty 1

## 2020-06-11 MED ORDER — MONTELUKAST SODIUM 10 MG PO TABS
10.0000 mg | ORAL_TABLET | Freq: Every day | ORAL | Status: DC
  Administered 2020-06-12 – 2020-06-17 (×6): 10 mg via ORAL
  Filled 2020-06-11 (×7): qty 1

## 2020-06-11 MED ORDER — LEVOFLOXACIN IN D5W 750 MG/150ML IV SOLN
750.0000 mg | Freq: Once | INTRAVENOUS | Status: AC
  Administered 2020-06-11: 750 mg via INTRAVENOUS
  Filled 2020-06-11: qty 150

## 2020-06-11 MED ORDER — METHYLPREDNISOLONE SODIUM SUCC 40 MG IJ SOLR: 40.0000 mg | Freq: Two times a day (BID) | INTRAMUSCULAR | Status: DC

## 2020-06-11 MED ORDER — AZELASTINE HCL 0.1 % NA SOLN
1.0000 | Freq: Two times a day (BID) | NASAL | Status: DC
  Administered 2020-06-12 – 2020-06-18 (×12): 1 via NASAL
  Filled 2020-06-11 (×2): qty 30

## 2020-06-11 MED ORDER — ONDANSETRON HCL 4 MG PO TABS: 4.0000 mg | ORAL_TABLET | Freq: Four times a day (QID) | ORAL | Status: DC | PRN

## 2020-06-11 MED ORDER — ALBUTEROL SULFATE HFA 108 (90 BASE) MCG/ACT IN AERS
8.0000 | INHALATION_SPRAY | RESPIRATORY_TRACT | Status: DC
  Administered 2020-06-11: 8 via RESPIRATORY_TRACT
  Filled 2020-06-11: qty 6.7

## 2020-06-11 MED ORDER — GUAIFENESIN-DM 100-10 MG/5ML PO SYRP
10.0000 mL | ORAL_SOLUTION | ORAL | Status: DC | PRN
  Administered 2020-06-12 – 2020-06-15 (×6): 10 mL via ORAL
  Filled 2020-06-11 (×5): qty 10

## 2020-06-11 MED ORDER — ALBUTEROL SULFATE HFA 108 (90 BASE) MCG/ACT IN AERS
1.0000 | INHALATION_SPRAY | RESPIRATORY_TRACT | Status: DC | PRN
  Filled 2020-06-11: qty 6.7

## 2020-06-11 MED ORDER — LORATADINE 10 MG PO TABS
10.0000 mg | ORAL_TABLET | Freq: Every day | ORAL | Status: DC
  Administered 2020-06-11 – 2020-06-18 (×8): 10 mg via ORAL
  Filled 2020-06-11 (×8): qty 1

## 2020-06-11 MED ORDER — IOHEXOL 350 MG/ML SOLN
80.0000 mL | Freq: Once | INTRAVENOUS | Status: AC | PRN
  Administered 2020-06-11: 80 mL via INTRAVENOUS

## 2020-06-11 MED ORDER — ALPRAZOLAM 0.25 MG PO TABS
0.2500 mg | ORAL_TABLET | Freq: Two times a day (BID) | ORAL | Status: DC | PRN
  Administered 2020-06-12 – 2020-06-17 (×8): 0.25 mg via ORAL
  Filled 2020-06-11 (×9): qty 1

## 2020-06-11 MED ORDER — DILTIAZEM HCL ER COATED BEADS 240 MG PO CP24
240.0000 mg | ORAL_CAPSULE | Freq: Every day | ORAL | Status: DC
  Administered 2020-06-11 – 2020-06-16 (×6): 240 mg via ORAL
  Filled 2020-06-11 (×7): qty 1

## 2020-06-11 MED ORDER — LEVOFLOXACIN IN D5W 750 MG/150ML IV SOLN: 750.0000 mg | INTRAVENOUS | Status: DC

## 2020-06-11 MED ORDER — ONDANSETRON HCL 4 MG/2ML IJ SOLN
4.0000 mg | Freq: Four times a day (QID) | INTRAMUSCULAR | Status: DC | PRN
  Administered 2020-06-11 – 2020-06-17 (×4): 4 mg via INTRAVENOUS
  Filled 2020-06-11 (×4): qty 2

## 2020-06-11 MED ORDER — PANTOPRAZOLE SODIUM 40 MG PO TBEC
40.0000 mg | DELAYED_RELEASE_TABLET | Freq: Every day | ORAL | Status: DC
  Administered 2020-06-11 – 2020-06-18 (×8): 40 mg via ORAL
  Filled 2020-06-11 (×8): qty 1

## 2020-06-11 MED ORDER — METHYLPREDNISOLONE SODIUM SUCC 40 MG IJ SOLR
20.0000 mg | Freq: Two times a day (BID) | INTRAMUSCULAR | Status: DC
  Administered 2020-06-11: 20 mg via INTRAVENOUS
  Filled 2020-06-11: qty 1

## 2020-06-11 MED ORDER — SODIUM CHLORIDE 0.9 % IV SOLN
100.0000 mg | Freq: Every day | INTRAVENOUS | Status: AC
  Administered 2020-06-12 – 2020-06-15 (×4): 100 mg via INTRAVENOUS
  Filled 2020-06-11 (×4): qty 20

## 2020-06-11 MED ORDER — LIDOCAINE 5 % EX PTCH
1.0000 | MEDICATED_PATCH | CUTANEOUS | Status: DC
  Administered 2020-06-11 – 2020-06-18 (×7): 1 via TRANSDERMAL
  Filled 2020-06-11 (×7): qty 1

## 2020-06-11 NOTE — ED Notes (Signed)
Pt transported to CT ?

## 2020-06-11 NOTE — ED Notes (Signed)
This nurse notified CT - pt now has 20 gauge IV established by IV team

## 2020-06-11 NOTE — ED Notes (Signed)
Pt expressed that she now has an appetite and wanted something warm to drink. RN provided pt with apple sauce, as well as warm tea. Tea placed in double cup, cooled for 5 minutes, and pt made aware that it is hot. Pt verbalized understanding.

## 2020-06-11 NOTE — Progress Notes (Signed)
\  Pharmacy Antibiotic Note  Megan [name] is a 69 y.o. female admitted on 06/10/2020 with Covid and COPD/bronchitis.  Pharmacy has been consulted for Levaquin dosing.  Plan: Levaquin 750mg  IV Q48H.  Height: 5\' 3"  (160 cm) Weight: 40.8 kg (90 lb) IBW/kg (Calculated) : 52.4  Temp (24hrs), Avg:98.1 F (36.7 C), Min:98.1 F (36.7 C), Max:98.1 F (36.7 C)  Recent Labs  Lab 06/10/20 1551 06/10/20 1930  WBC 6.3  --   CREATININE 1.01*  --   LATICACIDVEN  --  1.2    Estimated Creatinine Clearance: 34.8 mL/min (A) (by C-G formula based on SCr of 1.01 mg/dL (H)).    Allergies  Allergen Reactions  . Penicillins Rash and Hives    Has patient had a PCN reaction causing immediate rash, facial/tongue/throat swelling, SOB or lightheadedness with hypotension: No Has patient had a PCN reaction causing severe rash involving mucus membranes or skin necrosis:NO Has patient had a PCN reaction that required hospitalization No Has patient had a PCN reaction occurring within the last 10 years:NO If all of the above answers are "NO", then may proceed with Cephalosporin use.  Marland Kitchen Morphine And Related Other (See Comments)    Made the patient incoherent and she "talked out her head"  . Cephalexin Rash and Other (See Comments)    Flushed, also    Thank you for allowing pharmacy to be a part of this patient's care.  Wynona Neat, PharmD, BCPS  06/11/2020 1:35 AM

## 2020-06-11 NOTE — Progress Notes (Signed)
Megan [name]  QQV:[id number] DOB: 1951/10/18 DOA: 06/10/2020 PCP: [name], MD    Brief Narrative:  69 year old with a history of COPD on home oxygen and HTN who presented to the ED with 3-4 days of worsening shortness of breath with a productive cough and left-sided pleuritic chest pain.  She has not been vaccinated against COVID-19, and has had other family members in her home recently who were sick.  At presentation the patient was requiring a nonrebreather but was able to be de-escalated to 4 L nasal cannula support.  CXR confirmed bilateral pleural effusions with infiltrates diffusely.  The patient's Covid test was positive.  She was placed on IV Solu-Medrol and remdesivir.  Significant Events: 8/10 presented to [facility] ED 8/11 admitted via [facility] ED  Date of Positive COVID Test: 06/10/2020  Vaccination Status: Unvaccinated  COVID-19 specific Treatment: Remdesivir 8/10 > Solu-Medrol 8/10 >  Antimicrobials:  Levaquin 8/10 >  Subjective: Patient is seen for follow-up visit.  Saturations currently holding steady on 4 L nasal cannula support.  She is very talkative and in no acute distress.  Assessment & Plan:  COVID Pneumonia - acute on chronic hypoxic respiratory failure Continue Solu-Medrol and Remdesivir - Actemra not dosed due to concern for concomitant bacterial pneumonia with elevated procalcitonin  Recent Labs  Lab 06/10/20 1944 06/10/20 2131  DDIMER  --  2.78*  FERRITIN  --  208  CRP 19.0*  --   PROCALCITON  --  2.77    Bilateral lower lobe airway occlusions Noted on CTa chest - suggestive of mucous plugging or aspiration -SLP evaluation requested to rule out chronic occult aspiration  Significantly elevated D-dimer CT angio chest without evidence of PE  2.8 x 2.8cm cavitation in the left upper lobe Noted on admit CXR - CT confirms this finding and suggested is most consistent with a cavitating pneumonia -follow-up CT imaging will be required to assure this  resolves and if it does not malignancy will have to be considered  Chronic hypoxic respiratory failure due to moderate to severe COPD Requires home O2 support at 3 L/min nasal cannula -no wheezing on exam -presently requiring only slightly more than her usual baseline oxygen support  Mild hyponatremia Likely reflective of dehydration -monitor with gentle fluid support  HTN Blood pressure currently well controlled  Normocytic anemia No evidence of gross blood loss -check anemia panel  DVT prophylaxis: Lovenox Code Status: FULL CODE Family Communication:  Status is: Inpatient  Remains inpatient appropriate because:Inpatient level of care appropriate due to severity of illness   Dispo: The patient is from: Home              Anticipated d/c is to: Home              Anticipated d/c date is: 2 days              Patient currently is not medically stable to d/c.  Consultants:  none  Objective: Blood pressure 122/64, pulse (!) 101, temperature 98.1 F (36.7 C), resp. rate 13, height 5\' 3"  (1.6 m), weight 40.8 kg, SpO2 100 %. No intake or output data in the 24 hours ending 06/11/20 0808 Filed Weights   06/11/20 0100  Weight: 40.8 kg    Examination: F/U exam completed   CBC: Recent Labs  Lab 06/10/20 1551  WBC 6.3  HGB 11.9*  HCT 39.8  MCV 98.5  PLT 332   Basic Metabolic Panel: Recent Labs  Lab 06/10/20 1551  NA  132*  K 4.0  CL 82*  CO2 33*  GLUCOSE 60*  BUN 16  CREATININE 1.01*  CALCIUM 8.2*   GFR: Estimated Creatinine Clearance: 34.8 mL/min (A) (by C-G formula based on SCr of 1.01 mg/dL (H)).  Liver Function Tests: No results for input(s): AST, ALT, ALKPHOS, BILITOT, PROT, ALBUMIN in the last 168 hours. No results for input(s): LIPASE, AMYLASE in the last 168 hours. No results for input(s): AMMONIA in the last 168 hours.  Coagulation Profile: No results for input(s): INR, PROTIME in the last 168 hours.  Cardiac Enzymes: No results for input(s):  CKTOTAL, CKMB, CKMBINDEX, TROPONINI in the last 168 hours.  HbA1C: Hgb A1c MFr Bld  Date/Time Value Ref Range Status  04/19/2019 03:37 PM 6.4 4.6 - 6.5 % Final    Comment:    Glycemic Control Guidelines for People with Diabetes:Non Diabetic:  <6%Goal of Therapy: <7%Additional Action Suggested:  >8%   01/19/2018 11:27 AM 5.5 4.6 - 6.5 % Final    Comment:    Glycemic Control Guidelines for People with Diabetes:Non Diabetic:  <6%Goal of Therapy: <7%Additional Action Suggested:  >8%     CBG: Recent Labs  Lab 06/10/20 1830  GLUCAP 75    Recent Results (from the past 240 hour(s))  SARS Coronavirus 2 by RT PCR (hospital order, performed in [facility] hospital lab) Nasopharyngeal Nasopharyngeal Swab     Status: Abnormal   Collection Time: 06/10/20  3:48 PM   Specimen: Nasopharyngeal Swab  Result Value Ref Range Status   SARS Coronavirus 2 POSITIVE (A) NEGATIVE Final    Comment: RESULT CALLED TO, READ BACK BY AND VERIFIED WITH: L,CHILTON @1803  06/10/20 EB (NOTE) SARS-CoV-2 target nucleic acids are DETECTED  SARS-CoV-2 RNA is generally detectable in upper respiratory specimens  during the acute phase of infection.  Positive results are indicative  of the presence of the identified virus, but do not rule out bacterial infection or co-infection with other pathogens not detected by the test.  Clinical correlation with patient history and  other diagnostic information is necessary to determine patient infection status.  The expected result is negative.  Fact Sheet for Patients:   StrictlyIdeas.no   Fact Sheet for Healthcare Providers:   BankingDealers.co.za    This test is not yet approved or cleared by the [place] FDA and  has been authorized for detection and/or diagnosis of SARS-CoV-2 by FDA under an Emergency Use Authorization (EUA).  This EUA will remain in effect (meaning this test can b e used) for the duration of  the  COVID-19 declaration under Section 564(b)(1) of the Act, 21 U.S.C. section 360-bbb-3(b)(1), unless the authorization is terminated or revoked sooner.  Performed at [facility], [place] [address], [place], [place] [address]      Scheduled Meds: . albuterol  8 puff Inhalation Q4H  . aspirin EC  81 mg Oral Daily  . diltiazem  240 mg Oral Daily  . enoxaparin (LOVENOX) injection  40 mg Subcutaneous Q24H  . Fluticasone-Umeclidin-Vilant  1 puff Inhalation Daily  . lidocaine  1 patch Transdermal Q24H  . methylPREDNISolone (SOLU-MEDROL) injection  20 mg Intravenous Q12H  . metoprolol tartrate  12.5 mg Oral BID  . pantoprazole  40 mg Oral Daily   Continuous Infusions: . [START ON 06/13/2020] levofloxacin (LEVAQUIN) IV    . remdesivir 100 mg in NS 100 mL       LOS: 1 day   Cherene Altes, MD [facility] Office  [phone] Pager -  Text Page per Shea Evans  If 7PM-7AM, please contact night-coverage per Amion 06/11/2020, 8:08 AM

## 2020-06-11 NOTE — H&P (Signed)
History and Physical    Megan [name] WLN:[id number] DOB: [birth year] DOA: 06/10/2020  PCP: [name], MD  Patient coming from: Home.  Chief Complaint: Shortness of breath.  HPI: Megan [name] is a 69 y.o. female with history of severe COPD on 3 days home oxygen, hypertension presents to the ER because of worsening shortness of breath.  Patient has been short of breath for the last 3 to 4 days more than usual.  He has been having some productive cough and left-sided pleuritic chest pain.  Patient has not taken a COVID-19 vaccine.  Patient states her family members in her house has been recently sick.  ED Course: In the ER patient was initially requiring nonrebreather was soon be escalated to 4 L oxygen.  Chest x-ray shows bilateral pleural effusion with infiltrates.  Labs are significant for sodium 132 and hemoglobin 11.9 D-dimer 2.78 procalcitonin 2.7 CRP 19 and Covid test positive.  Patient has been started on IV Solu-Medrol and remdesivir.  EKG shows sinus tachycardia.  High-sensitivity troponin was negative.  Review of Systems: As per HPI, rest all negative.   Past Medical History:  Diagnosis Date  . Anemia   . Anxiety state 08/20/2015  . CAP (community acquired pneumonia)   . CHF (congestive heart failure) ([place])   . COPD (chronic obstructive pulmonary disease) ([place])   . Depression   . Essential hypertension 08/19/2015  . GERD (gastroesophageal reflux disease)   . Headache   . History of hiatal hernia   . Myocardial infarction ([place])   . Shortness of breath dyspnea     Past Surgical History:  Procedure Laterality Date  . CARDIAC CATHETERIZATION    . CARDIAC CATHETERIZATION N/A 08/26/2015   Procedure: Left Heart Cath and Coronary Angiography;  Surgeon: Jettie Booze, MD;  Location: [facility];  Service: Cardiovascular;  Laterality: N/A;  . ESOPHAGOGASTRODUODENOSCOPY N/A 04/18/2018   Procedure: ESOPHAGOGASTRODUODENOSCOPY (EGD);  Surgeon: Ladene Artist, MD;   Location: [facility];  Service: Endoscopy;  Laterality: N/A;  . tracheostomy       reports that she quit smoking about 19 years ago. Her smoking use included cigarettes. She has a 30.00 pack-year smoking history. She has never used smokeless tobacco. She reports that she does not drink alcohol and does not use drugs.  Allergies  Allergen Reactions  . Penicillins Rash and Hives    Has patient had a PCN reaction causing immediate rash, facial/tongue/throat swelling, SOB or lightheadedness with hypotension: No Has patient had a PCN reaction causing severe rash involving mucus membranes or skin necrosis:NO Has patient had a PCN reaction that required hospitalization No Has patient had a PCN reaction occurring within the last 10 years:NO If all of the above answers are "NO", then may proceed with Cephalosporin use.  Marland Kitchen Morphine And Related Other (See Comments)    Made the patient incoherent and she "talked out her head"  . Cephalexin Rash and Other (See Comments)    Flushed, also    Family History  Problem Relation Age of Onset  . Cirrhosis Mother   . Depression Mother   . Heart disease Father     Prior to Admission medications   Medication Sig Start Date End Date Taking? Authorizing Provider  acetaminophen (TYLENOL) 500 MG tablet Take 500-1,000 mg by mouth every 6 (six) hours as needed for mild pain or headache.    [provider]  albuterol (VENTOLIN HFA) 108 (90 Base) MCG/ACT inhaler INHALE 2 PUFFS BY MOUTH EVERY  6 HOURS AS NEEDED FOR WHEEZING OR SHORTNESS OF BREATH Patient taking differently: Inhale 2 puffs into the lungs every 6 (six) hours as needed for wheezing or shortness of breath.  11/07/19   [name], MD  ALPRAZolam Duanne Moron) 0.25 MG tablet 1 tab by mouth twice per day as needed 11/12/19   [name], MD  aspirin EC 81 MG tablet Take 81 mg by mouth daily.    [provider]  azelastine (ASTELIN) 0.1 % nasal spray Place 1 spray into both nostrils 2 (two)  times daily. Use in each nostril as directed 06/12/19   Martyn Ehrich, NP  azithromycin (ZITHROMAX) 250 MG tablet Take 1 tablet (250 mg total) by mouth daily. Take two tablets day one, then one a day. Alternate months. 03/17/20   Collene Gobble, MD  butalbital-acetaminophen-caffeine (FIORICET, ESGIC) [id number] MG tablet Take 1 tablet by mouth 2 (two) times daily as needed for headache. 02/10/18   [name], MD  diltiazem (CARDIZEM CD) 240 MG 24 hr capsule Take 1 capsule (240 mg total) by mouth daily. 12/07/19   [name], MD  doxycycline (VIBRA-TABS) 100 MG tablet Take 1 tablet (100 mg total) by mouth 2 (two) times daily. Take the first week in May, July, September, November, January, and March 03/17/20   Collene Gobble, MD  feeding supplement, ENSURE ENLIVE, (ENSURE ENLIVE) LIQD Take 237 mLs by mouth 4 (four) times daily. 01/11/17   Eugenie Filler, MD  fluticasone (FLONASE) 50 MCG/ACT nasal spray SHAKE LIQUID AND USE 2 SPRAYS IN EACH NOSTRIL DAILY Patient taking differently: Place 2 sprays into both nostrils daily.  07/25/19   Collene Gobble, MD  guaiFENesin (MUCINEX) 600 MG 12 hr tablet Take 2 tablets (1,200 mg total) by mouth 2 (two) times daily. 12/10/19   Martyn Ehrich, NP  ipratropium-albuterol (DUONEB) 0.5-2.5 (3) MG/3ML SOLN Take 3 mLs by nebulization every 6 (six) hours as needed. 03/08/19   Martyn Ehrich, NP  levofloxacin (LEVAQUIN) 500 MG tablet Take 1 tablet (500 mg total) by mouth daily. 03/14/20   Martyn Ehrich, NP  lidocaine (LIDODERM) 5 % Place 1 patch onto the skin daily. Remove & Discard patch within 12 hours or as directed by MD 12/10/19   Martyn Ehrich, NP  loratadine (CLARITIN) 10 MG tablet Take 1 tablet (10 mg total) by mouth daily. 11/14/19   [name], MD  metoprolol tartrate (LOPRESSOR) 25 MG tablet TAKE 1/2 TABLET(12.5 MG) BY MOUTH TWICE DAILY 12/07/19   [name], MD  montelukast (SINGULAIR) 10 MG tablet Take 1 tablet (10 mg total) by mouth at  bedtime. 09/22/18   Martyn Ehrich, NP  Multiple Vitamin (TAB-A-VITE/BETA CAROTENE) TABS Take 1 tablet by mouth daily. 01/01/14   [provider]  pantoprazole (PROTONIX) 40 MG tablet TAKE 1 TABLET(40 MG) BY MOUTH DAILY Patient taking differently: Take 40 mg by mouth daily.  12/10/19   [name], MD  polyethylene glycol [facility] Floria Raveling) packet Take 17 g by mouth 2 (two) times daily. Patient taking differently: Take 17 g by mouth daily as needed for mild constipation.  01/11/17   Eugenie Filler, MD  predniSONE (DELTASONE) 20 MG tablet TAKE 1 TABLET BY MOUTH EVERY DAY WITH BREAKFAST Patient taking differently: Take 20 mg by mouth daily with breakfast.  05/06/20   Martyn Ehrich, NP  predniSONE (DELTASONE) 5 MG tablet Take 3 tablets (15 mg total) by mouth daily with  breakfast. 02/27/20   Collene Gobble, MD  senna-docusate (SENOKOT-S) 8.6-50 MG tablet Take 1 tablet by mouth 2 (two) times daily. Patient taking differently: Take 1 tablet by mouth at bedtime as needed for mild constipation.  02/27/18   Roxan Hockey, MD  tiZANidine (ZANAFLEX) 2 MG tablet Take 1 tablet (2 mg total) by mouth every 6 (six) hours as needed for muscle spasms. 04/19/19   [name], MD  TRELEGY ELLIPTA 100-62.5-25 MCG/INH AEPB INHALE 1 PUFF INTO THE LUNGS DAILY 02/26/20   Collene Gobble, MD  Vitamin D, Ergocalciferol, (DRISDOL) 1.25 MG (50000 UT) CAPS capsule Take 1 capsule (50,000 Units total) by mouth every 7 (seven) days. 04/20/19   [name], MD    Physical Exam: Constitutional: Moderately built and nourished. Vitals:   06/10/20 1726 06/10/20 1900 06/11/20 0045 06/11/20 0100  BP: (!) 123/55 (!) 128/57 (!) 139/54 (!) 116/55  Pulse: (!) 114 (!) 112 97 95  Resp: 18 19 (!) 23 (!) 21  Temp: 98.1 F (36.7 C)     SpO2: 98% 93% 100% 100%   Eyes: Anicteric no pallor. ENMT: No discharge from the ears eyes nose or mouth. Neck: No mass felt.  No neck rigidity.  No JVD appreciated. Respiratory:  Mild expiratory wheeze heard no crepitations. Cardiovascular: S1-S2 heard. Abdomen: Soft nontender bowel sounds present. Musculoskeletal: No edema. Skin: No rash. Neurologic: Alert awake oriented to time place and person.  Moves all extremities. Psychiatric: Appears normal.  Normal affect.   Labs on Admission: I have personally reviewed following labs and imaging studies  CBC: Recent Labs  Lab 06/10/20 1551  WBC 6.3  HGB 11.9*  HCT 39.8  MCV 98.5  PLT 242   Basic Metabolic Panel: Recent Labs  Lab 06/10/20 1551  NA 132*  K 4.0  CL 82*  CO2 33*  GLUCOSE 60*  BUN 16  CREATININE 1.01*  CALCIUM 8.2*   GFR: CrCl cannot be calculated (Unknown ideal weight.). Liver Function Tests: No results for input(s): AST, ALT, ALKPHOS, BILITOT, PROT, ALBUMIN in the last 168 hours. No results for input(s): LIPASE, AMYLASE in the last 168 hours. No results for input(s): AMMONIA in the last 168 hours. Coagulation Profile: No results for input(s): INR, PROTIME in the last 168 hours. Cardiac Enzymes: No results for input(s): CKTOTAL, CKMB, CKMBINDEX, TROPONINI in the last 168 hours. BNP (last 3 results) No results for input(s): PROBNP in the last 8760 hours. HbA1C: No results for input(s): HGBA1C in the last 72 hours. CBG: Recent Labs  Lab 06/10/20 1830  GLUCAP 75   Lipid Profile: Recent Labs    06/10/20 1944  TRIG 193*   Thyroid Function Tests: No results for input(s): TSH, T4TOTAL, FREET4, T3FREE, THYROIDAB in the last 72 hours. Anemia Panel: Recent Labs    06/10/20 2131  FERRITIN 208   Urine analysis:    Component Value Date/Time   COLORURINE YELLOW 04/19/2019 1537   APPEARANCEUR CLEAR 04/19/2019 1537   LABSPEC >=1.030 (A) 04/19/2019 1537   PHURINE 5.0 04/19/2019 1537   GLUCOSEU NEGATIVE 04/19/2019 1537   HGBUR NEGATIVE 04/19/2019 1537   BILIRUBINUR NEGATIVE 04/19/2019 1537   KETONESUR NEGATIVE 04/19/2019 1537   PROTEINUR 100 (A) 11/04/2018 1622    UROBILINOGEN 0.2 04/19/2019 1537   NITRITE NEGATIVE 04/19/2019 1537   LEUKOCYTESUR NEGATIVE 04/19/2019 1537   Sepsis Labs: @LABRCNTIP (procalcitonin:4,lacticidven:4) ) Recent Results (from the past 240 hour(s))  SARS Coronavirus 2 by RT PCR (hospital order, performed in [facility] hospital lab) Nasopharyngeal  Nasopharyngeal Swab     Status: Abnormal   Collection Time: 06/10/20  3:48 PM   Specimen: Nasopharyngeal Swab  Result Value Ref Range Status   SARS Coronavirus 2 POSITIVE (A) NEGATIVE Final    Comment: RESULT CALLED TO, READ BACK BY AND VERIFIED WITH: L,CHILTON @1803  06/10/20 EB (NOTE) SARS-CoV-2 target nucleic acids are DETECTED  SARS-CoV-2 RNA is generally detectable in upper respiratory specimens  during the acute phase of infection.  Positive results are indicative  of the presence of the identified virus, but do not rule out bacterial infection or co-infection with other pathogens not detected by the test.  Clinical correlation with patient history and  other diagnostic information is necessary to determine patient infection status.  The expected result is negative.  Fact Sheet for Patients:   StrictlyIdeas.no   Fact Sheet for Healthcare Providers:   BankingDealers.co.za    This test is not yet approved or cleared by the [place] FDA and  has been authorized for detection and/or diagnosis of SARS-CoV-2 by FDA under an Emergency Use Authorization (EUA).  This EUA will remain in effect (meaning this test can b e used) for the duration of  the COVID-19 declaration under Section 564(b)(1) of the Act, 21 U.S.C. section 360-bbb-3(b)(1), unless the authorization is terminated or revoked sooner.  Performed at [facility], [place] [address] [place], [place] [address]      Radiological Exams on Admission: DG Chest 2 View  Result Date: 06/10/2020 CLINICAL DATA:  69 year old female with shortness of breath.  Respiratory distress. EXAM: CHEST - 2 VIEW COMPARISON:  Chest radiograph dated 11/04/2018. FINDINGS: There is background of severe emphysema. There are small bilateral pleural effusions. The left lung base atelectasis/scarring versus infiltrate. There is a 2.9 x 2.4 cm focal nodular density in the left upper lobe which is new since the prior radiograph. If there is clinical concern for infection close follow-up after treatment and resolution of the symptoms recommended. If there is no clinical findings of infection, further evaluation with CT is recommended. There is no pneumothorax. The cardiac silhouette is within limits. There is bilateral hilar prominence consistent with pulmonary hypertension. Atherosclerotic calcification of the aorta. No acute osseous pathology. IMPRESSION: 1. Small bilateral pleural effusions and left lung base atelectasis/scarring versus infiltrate. 2. Focal nodular density in the left upper lobe, new since the prior radiograph. Follow-up to resolution or further evaluation with CT is recommended. 3. Aortic Atherosclerosis (ICD10-I70.0) and Emphysema (ICD10-J43.9). Electronically Signed   By: Anner Crete M.D.   On: 06/10/2020 16:34    EKG: Independently reviewed.  Sinus tachycardia with nonspecific ST changes.  Assessment/Plan Principal Problem:   Acute respiratory failure due to COVID-19 [facility]) Active Problems:   Essential hypertension   Chronic respiratory failure with hypoxia and hypercapnia (HCC)   COPD exacerbation (HCC)   Acute respiratory disease due to COVID-19 virus    1. Acute respiratory failure with hypoxia secondary to Covid infection and also COPD.  I have placed patient on IV Solu-Medrol and remdesivir.  Since patient has elevated procalcitonin I am concerned about community-acquired pneumonia and I placed patient on Levaquin.  I have not placed patient on Actemra at this time because of the elevated procalcitonin.  Since patient has pleuritic type of chest  pain I have ordered CT angiogram of the chest. 2. Hypertension on beta-blockers and calcium channel blocker. 3. Anemia follow CBC.  Since patient is acute respiratory failure with Covid infection will need close monitoring for any further  worsening and inpatient status.   DVT prophylaxis: Lovenox. Code Status: Full code. Family Communication: Discussed with patient. Disposition Plan: Home. Consults called: None. Admission status: Inpatient.   Rise Patience MD [facility] Pager [phone].  If 7PM-7AM, please contact night-coverage [email or web address] Password [email or web address]  06/11/2020, 1:24 AM

## 2020-06-12 ENCOUNTER — Encounter (HOSPITAL_COMMUNITY): Payer: Self-pay | Admitting: Internal Medicine

## 2020-06-12 ENCOUNTER — Other Ambulatory Visit: Payer: Self-pay

## 2020-06-12 LAB — GLUCOSE, CAPILLARY
Glucose-Capillary: 264 mg/dL — ABNORMAL HIGH (ref 70–99)
Glucose-Capillary: 494 mg/dL — ABNORMAL HIGH (ref 70–99)
Glucose-Capillary: 393 mg/dL — ABNORMAL HIGH (ref 70–99)

## 2020-06-12 LAB — CBC WITH DIFFERENTIAL/PLATELET
Abs Immature Granulocytes: 0.05 10*3/uL (ref 0.00–0.07)
Basophils Absolute: 0 10*3/uL (ref 0.0–0.1)
Basophils Relative: 0 %
Eosinophils Absolute: 0 10*3/uL (ref 0.0–0.5)
Eosinophils Relative: 0 %
HCT: 38.1 % (ref 36.0–46.0)
Hemoglobin: 11.6 g/dL — ABNORMAL LOW (ref 12.0–15.0)
Immature Granulocytes: 0 %
Lymphocytes Relative: 3 %
Lymphs Abs: 0.4 10*3/uL — ABNORMAL LOW (ref 0.7–4.0)
MCH: 29.9 pg (ref 26.0–34.0)
MCHC: 30.4 g/dL (ref 30.0–36.0)
MCV: 98.2 fL (ref 80.0–100.0)
Monocytes Absolute: 1 10*3/uL (ref 0.1–1.0)
Monocytes Relative: 7 %
Neutro Abs: 12.4 10*3/uL — ABNORMAL HIGH (ref 1.7–7.7)
Neutrophils Relative %: 90 %
Platelets: 361 10*3/uL (ref 150–400)
RBC: 3.88 MIL/uL (ref 3.87–5.11)
RDW: 12.5 % (ref 11.5–15.5)
WBC: 13.8 10*3/uL — ABNORMAL HIGH (ref 4.0–10.5)
nRBC: 0 % (ref 0.0–0.2)

## 2020-06-12 LAB — IRON AND TIBC
Iron: 55 ug/dL (ref 28–170)
Saturation Ratios: 19 % (ref 10.4–31.8)
TIBC: 283 ug/dL (ref 250–450)
UIBC: 228 ug/dL

## 2020-06-12 LAB — COMPREHENSIVE METABOLIC PANEL
ALT: 18 U/L (ref 0–44)
AST: 22 U/L (ref 15–41)
Albumin: 2.5 g/dL — ABNORMAL LOW (ref 3.5–5.0)
Alkaline Phosphatase: 52 U/L (ref 38–126)
Anion gap: 9 (ref 5–15)
BUN: 27 mg/dL — ABNORMAL HIGH (ref 8–23)
CO2: 41 mmol/L — ABNORMAL HIGH (ref 22–32)
Calcium: 8.5 mg/dL — ABNORMAL LOW (ref 8.9–10.3)
Chloride: 85 mmol/L — ABNORMAL LOW (ref 98–111)
Creatinine, Ser: 0.88 mg/dL (ref 0.44–1.00)
GFR calc Af Amer: >60 mL/min (ref >60)
GFR calc non Af Amer: >60 mL/min (ref >60)
Glucose, Bld: 280 mg/dL — ABNORMAL HIGH (ref 70–99)
Potassium: 4.8 mmol/L (ref 3.5–5.1)
Sodium: 135 mmol/L (ref 135–145)
Total Bilirubin: 0.6 mg/dL (ref 0.3–1.2)
Total Protein: 5.6 g/dL — ABNORMAL LOW (ref 6.5–8.1)

## 2020-06-12 LAB — RETICULOCYTES
Immature Retic Fract: 5.5 % (ref 2.3–15.9)
RBC.: 3.84 MIL/uL — ABNORMAL LOW (ref 3.87–5.11)
Retic Count, Absolute: 21.1 10*3/uL (ref 19.0–186.0)
Retic Ct Pct: 0.6 % (ref 0.4–3.1)

## 2020-06-12 LAB — D-DIMER, QUANTITATIVE: D-Dimer, Quant: 1.84 ug/mL-FEU — ABNORMAL HIGH (ref 0.00–0.50)

## 2020-06-12 LAB — VITAMIN B12: Vitamin B-12: 1224 pg/mL — ABNORMAL HIGH (ref 180–914)

## 2020-06-12 LAB — FERRITIN: Ferritin: 168 ng/mL (ref 11–307)

## 2020-06-12 LAB — C-REACTIVE PROTEIN: CRP: 9.8 mg/dL — ABNORMAL HIGH (ref <1.0)

## 2020-06-12 LAB — FOLATE: Folate: 17 ng/mL (ref >5.9)

## 2020-06-12 MED ORDER — INSULIN ASPART 100 UNIT/ML ~~LOC~~ SOLN
3.0000 [IU] | Freq: Three times a day (TID) | SUBCUTANEOUS | Status: DC
  Administered 2020-06-12 – 2020-06-14 (×7): 3 [IU] via SUBCUTANEOUS

## 2020-06-12 MED ORDER — INSULIN GLARGINE 100 UNIT/ML ~~LOC~~ SOLN
14.0000 [IU] | Freq: Every day | SUBCUTANEOUS | Status: DC
  Administered 2020-06-12 – 2020-06-13 (×2): 14 [IU] via SUBCUTANEOUS
  Filled 2020-06-12 (×3): qty 0.14

## 2020-06-12 MED ORDER — INSULIN ASPART 100 UNIT/ML ~~LOC~~ SOLN
0.0000 [IU] | Freq: Every day | SUBCUTANEOUS | Status: DC
  Administered 2020-06-12: 5 [IU] via SUBCUTANEOUS

## 2020-06-12 MED ORDER — FENTANYL CITRATE (PF) 100 MCG/2ML IJ SOLN
25.0000 ug | INTRAMUSCULAR | Status: AC | PRN
  Administered 2020-06-12 (×2): 50 ug via INTRAVENOUS
  Filled 2020-06-12 (×2): qty 2

## 2020-06-12 MED ORDER — INSULIN ASPART 100 UNIT/ML ~~LOC~~ SOLN
0.0000 [IU] | Freq: Three times a day (TID) | SUBCUTANEOUS | Status: DC
  Administered 2020-06-12: 9 [IU] via SUBCUTANEOUS
  Administered 2020-06-12: 3 [IU] via SUBCUTANEOUS
  Administered 2020-06-13: 2 [IU] via SUBCUTANEOUS
  Administered 2020-06-13: 5 [IU] via SUBCUTANEOUS
  Administered 2020-06-13: 2 [IU] via SUBCUTANEOUS
  Administered 2020-06-14: 5 [IU] via SUBCUTANEOUS
  Administered 2020-06-14: 2 [IU] via SUBCUTANEOUS
  Administered 2020-06-15: 3 [IU] via SUBCUTANEOUS
  Administered 2020-06-15: 2 [IU] via SUBCUTANEOUS

## 2020-06-12 MED ORDER — ACETAMINOPHEN 325 MG PO TABS
650.0000 mg | ORAL_TABLET | Freq: Four times a day (QID) | ORAL | Status: DC | PRN
  Administered 2020-06-13 – 2020-06-17 (×5): 650 mg via ORAL
  Filled 2020-06-12 (×5): qty 2

## 2020-06-12 MED ORDER — MELATONIN 3 MG PO TABS
3.0000 mg | ORAL_TABLET | Freq: Every day | ORAL | Status: DC
  Administered 2020-06-12 – 2020-06-17 (×7): 3 mg via ORAL
  Filled 2020-06-12 (×8): qty 1

## 2020-06-12 NOTE — ED Notes (Signed)
Paged Dr Raliegh Ip to [facility]

## 2020-06-12 NOTE — ED Notes (Addendum)
Pt speaking loudly with this RN, pt appears anxious shifting in bed, removing replacing blankets while speaking with this RN. Reporting mid and lower back pain stating " It feels like the pain is in my lungs." Requesting "the strongest pain medicine and something to help me sleep." This RN removed lidocaine patch, pt states " I need something stronger than that, it didn't help" and offered PRN Xanax pt states " That isn't going to work." [facility] on call paged.

## 2020-06-12 NOTE — ED Notes (Signed)
Tele  Breakfast Ordered 

## 2020-06-12 NOTE — Progress Notes (Signed)
Megan [name]  ZOX:[id number] DOB: 1951-02-13 DOA: 06/10/2020 PCP: [name], MD    Brief Narrative:  69 year old with a history of COPD on home oxygen and HTN who presented to the ED with 3-4 days of worsening shortness of breath with a productive cough and left-sided pleuritic chest pain.  She has not been vaccinated against COVID-19, and has had other family members in her home recently who were sick.  At presentation the patient was requiring a nonrebreather but was able to be de-escalated to 4 L nasal cannula support.  CXR confirmed bilateral pleural effusions with infiltrates diffusely.  The patient's Covid test was positive.  She was placed on IV Solu-Medrol and remdesivir.  Significant Events: 8/10 presented to [facility] ED 8/11 admitted via [facility] ED  Date of Positive COVID Test: 06/10/2020  Vaccination Status: Unvaccinated  COVID-19 specific Treatment: Remdesivir 8/10 > Solu-Medrol 8/10 >  Antimicrobials:  Levaquin 8/10 >  Subjective: Patient had difficulty with insomnia and significant anxiety last night per nursing notes.  Oxygen saturations are 98-100% on 4 L nasal cannula support only.  Vital signs are otherwise stable.  She states that she lives with her daughter, daughter significant other, and 3 small children and is concerned that it will not be safe for her to go home.    She further reports a prior history of requiring esophageal dilatation more than once and occasional episodes of "food sticking in her throat".  Assessment & Plan:  COVID Pneumonia - acute on chronic hypoxic respiratory failure Continue Solu-Medrol and Remdesivir - Actemra will not be dosed due to concern for concomitant bacterial pneumonia with elevated procalcitonin -inflammatory markers trending in a favorable direction -clinically appears to be improving  Recent Labs  Lab 06/10/20 1944 06/10/20 2131 06/12/20 0403  DDIMER  --  2.78* 1.84*  FERRITIN  --  208 168  CRP 19.0*  --  9.8*  ALT  --    --  18  PROCALCITON  --  2.77  --     Bilateral lower lobe airway occlusions Noted on CTa chest - suggestive of mucous plugging or aspiration -SLP evaluation requested to rule out chronic occult aspiration  Significantly elevated D-dimer CT angio chest without evidence of PE -D-dimer now trending downward -likely simply an acute phase reactant in setting of Covid -if begins to climb again will check lower extremity duplex  2.8 x 2.8cm cavitation in the left upper lobe Noted on admit CXR - CT confirms this finding and suggested is most consistent with a cavitating pneumonia -follow-up CT imaging will be required to assure this resolves and if it does not malignancy will have to be considered -continue empiric Levaquin -suspect she may suffer intermittent episodic aspiration related to esophageal spasming and or stricture  Chronic hypoxic respiratory failure due to moderate to severe COPD Requires home O2 support at 3 L/min nasal cannula -no wheezing on exam -presently requiring only slightly more than her usual baseline oxygen support  Mild hyponatremia Likely reflective of dehydration -corrected with gentle volume resuscitation  Hyperglycemia Steroid-induced -check A1c to rule out occult DM - A1c 6.05 April 2019  HTN Blood pressure well controlled  Normocytic anemia No evidence of gross blood loss -anemia panel without evidence of severe iron deficiency and with adequate B12 and folate levels  DVT prophylaxis: Lovenox Code Status: FULL CODE Family Communication:  Status is: Inpatient  Remains inpatient appropriate because:Inpatient level of care appropriate due to severity of illness   Dispo: The patient is  from: Home              Anticipated d/c is to: Home              Anticipated d/c date is: 2 days              Patient currently is not medically stable to d/c.  May need to investigate a rehab stay within a skilled nursing facility prior to returning home.  Consultants:    none  Objective: Blood pressure (!) 108/54, pulse 64, temperature 98.1 F (36.7 C), resp. rate (!) 22, height 5\' 3"  (1.6 m), weight 40.8 kg, SpO2 100 %. No intake or output data in the 24 hours ending 06/12/20 0842 Filed Weights   06/11/20 0100  Weight: 40.8 kg    Examination: General: No acute respiratory distress Lungs: Fine crackles bilateral bases with no wheezing Cardiovascular: Regular rate and rhythm without murmur gallop or rub normal S1 and S2 Abdomen: Nontender, nondistended, soft, bowel sounds positive, no rebound, no ascites, no appreciable mass Extremities: No significant cyanosis, clubbing, or edema bilateral lower extremities   CBC: Recent Labs  Lab 06/10/20 1551 06/12/20 0403  WBC 6.3 13.8*  NEUTROABS  --  12.4*  HGB 11.9* 11.6*  HCT 39.8 38.1  MCV 98.5 98.2  PLT 354 846   Basic Metabolic Panel: Recent Labs  Lab 06/10/20 1551 06/12/20 0403  NA 132* 135  K 4.0 4.8  CL 82* 85*  CO2 33* 41*  GLUCOSE 60* 280*  BUN 16 27*  CREATININE 1.01* 0.88  CALCIUM 8.2* 8.5*   GFR: Estimated Creatinine Clearance: 40 mL/min (by C-G formula based on SCr of 0.88 mg/dL).  Liver Function Tests: Recent Labs  Lab 06/12/20 0403  AST 22  ALT 18  ALKPHOS 52  BILITOT 0.6  PROT 5.6*  ALBUMIN 2.5*   No results for input(s): LIPASE, AMYLASE in the last 168 hours. No results for input(s): AMMONIA in the last 168 hours.  Coagulation Profile: No results for input(s): INR, PROTIME in the last 168 hours.  Cardiac Enzymes: No results for input(s): CKTOTAL, CKMB, CKMBINDEX, TROPONINI in the last 168 hours.  HbA1C: Hgb A1c MFr Bld  Date/Time Value Ref Range Status  04/19/2019 03:37 PM 6.4 4.6 - 6.5 % Final    Comment:    Glycemic Control Guidelines for People with Diabetes:Non Diabetic:  <6%Goal of Therapy: <7%Additional Action Suggested:  >8%   01/19/2018 11:27 AM 5.5 4.6 - 6.5 % Final    Comment:    Glycemic Control Guidelines for People with Diabetes:Non  Diabetic:  <6%Goal of Therapy: <7%Additional Action Suggested:  >8%     CBG: Recent Labs  Lab 06/10/20 1830  GLUCAP 75    Recent Results (from the past 240 hour(s))  SARS Coronavirus 2 by RT PCR (hospital order, performed in [place] hospital lab) Nasopharyngeal Nasopharyngeal Swab     Status: Abnormal   Collection Time: 06/10/20  3:48 PM   Specimen: Nasopharyngeal Swab  Result Value Ref Range Status   SARS Coronavirus 2 POSITIVE (A) NEGATIVE Final    Comment: RESULT CALLED TO, READ BACK BY AND VERIFIED WITH: L,CHILTON @1803  06/10/20 EB (NOTE) SARS-CoV-2 target nucleic acids are DETECTED  SARS-CoV-2 RNA is generally detectable in upper respiratory specimens  during the acute phase of infection.  Positive results are indicative  of the presence of the identified virus, but do not rule out bacterial infection or co-infection with other pathogens not detected by the test.  Clinical correlation  with patient history and  other diagnostic information is necessary to determine patient infection status.  The expected result is negative.  Fact Sheet for Patients:   StrictlyIdeas.no   Fact Sheet for Healthcare Providers:   BankingDealers.co.za    This test is not yet approved or cleared by the [place] FDA and  has been authorized for detection and/or diagnosis of SARS-CoV-2 by FDA under an Emergency Use Authorization (EUA).  This EUA will remain in effect (meaning this test can b e used) for the duration of  the COVID-19 declaration under Section 564(b)(1) of the Act, 21 U.S.C. section 360-bbb-3(b)(1), unless the authorization is terminated or revoked sooner.  Performed at [facility], [place] [address] [place], [place] [address]   Blood Culture (routine x 2)     Status: None (Preliminary result)   Collection Time: 06/10/20  7:47 PM   Specimen: BLOOD  Result Value Ref Range Status   Specimen Description BLOOD LEFT  ANTECUBITAL  Final   Special Requests   Final    BOTTLES DRAWN AEROBIC AND ANAEROBIC Blood Culture results may not be optimal due to an inadequate volume of blood received in culture bottles   Culture   Final    NO GROWTH 2 DAYS Performed at [facility], [place] [address] [place], [place] [address]    Report Status PENDING  Incomplete  Blood Culture (routine x 2)     Status: None (Preliminary result)   Collection Time: 06/10/20  9:22 PM   Specimen: BLOOD RIGHT HAND  Result Value Ref Range Status   Specimen Description BLOOD RIGHT HAND  Final   Special Requests   Final    BOTTLES DRAWN AEROBIC AND ANAEROBIC Blood Culture adequate volume   Culture   Final    NO GROWTH 2 DAYS Performed at [facility], [place] [address] [place], [place] [address]    Report Status PENDING  Incomplete     Scheduled Meds: . aspirin EC  81 mg Oral Daily  . azelastine  1 spray Each Nare BID  . diltiazem  240 mg Oral Daily  . enoxaparin (LOVENOX) injection  30 mg Subcutaneous Daily  . fluticasone  2 spray Each Nare Daily  . fluticasone furoate-vilanterol  1 puff Inhalation Daily  . lidocaine  1 patch Transdermal Q24H  . loratadine  10 mg Oral Daily  . melatonin  3 mg Oral QHS  . methylPREDNISolone (SOLU-MEDROL) injection  20 mg Intravenous Q12H  . metoprolol tartrate  12.5 mg Oral BID  . montelukast  10 mg Oral QHS  . pantoprazole  40 mg Oral Daily  . umeclidinium bromide  1 puff Inhalation Daily   Continuous Infusions: . [START ON 06/13/2020] levofloxacin (LEVAQUIN) IV    . remdesivir 100 mg in NS 100 mL       LOS: 2 days   Cherene Altes, MD [facility] Office  [phone] Pager - Text Page per Amion  If 7PM-7AM, please contact night-coverage per [facility] 06/12/2020, 8:42 AM

## 2020-06-13 ENCOUNTER — Other Ambulatory Visit: Payer: Self-pay | Admitting: Internal Medicine

## 2020-06-13 LAB — COMPREHENSIVE METABOLIC PANEL
ALT: 13 U/L (ref 0–44)
AST: 16 U/L (ref 15–41)
Albumin: 2.3 g/dL — ABNORMAL LOW (ref 3.5–5.0)
Alkaline Phosphatase: 51 U/L (ref 38–126)
Anion gap: 9 (ref 5–15)
BUN: 46 mg/dL — ABNORMAL HIGH (ref 8–23)
CO2: 42 mmol/L — ABNORMAL HIGH (ref 22–32)
Calcium: 9.1 mg/dL (ref 8.9–10.3)
Chloride: 85 mmol/L — ABNORMAL LOW (ref 98–111)
Creatinine, Ser: 0.96 mg/dL (ref 0.44–1.00)
GFR calc Af Amer: >60 mL/min (ref >60)
GFR calc non Af Amer: >60 mL/min (ref >60)
Glucose, Bld: 169 mg/dL — ABNORMAL HIGH (ref 70–99)
Potassium: 5.1 mmol/L (ref 3.5–5.1)
Sodium: 136 mmol/L (ref 135–145)
Total Bilirubin: 0.3 mg/dL (ref 0.3–1.2)
Total Protein: 5.3 g/dL — ABNORMAL LOW (ref 6.5–8.1)

## 2020-06-13 LAB — CBC WITH DIFFERENTIAL/PLATELET
Abs Immature Granulocytes: 0.05 10*3/uL (ref 0.00–0.07)
Basophils Absolute: 0 10*3/uL (ref 0.0–0.1)
Basophils Relative: 0 %
Eosinophils Absolute: 0 10*3/uL (ref 0.0–0.5)
Eosinophils Relative: 0 %
HCT: 36.3 % (ref 36.0–46.0)
Hemoglobin: 11 g/dL — ABNORMAL LOW (ref 12.0–15.0)
Immature Granulocytes: 0 %
Lymphocytes Relative: 3 %
Lymphs Abs: 0.4 10*3/uL — ABNORMAL LOW (ref 0.7–4.0)
MCH: 30 pg (ref 26.0–34.0)
MCHC: 30.3 g/dL (ref 30.0–36.0)
MCV: 98.9 fL (ref 80.0–100.0)
Monocytes Absolute: 0.7 10*3/uL (ref 0.1–1.0)
Monocytes Relative: 5 %
Neutro Abs: 12.6 10*3/uL — ABNORMAL HIGH (ref 1.7–7.7)
Neutrophils Relative %: 92 %
Platelets: 375 10*3/uL (ref 150–400)
RBC: 3.67 MIL/uL — ABNORMAL LOW (ref 3.87–5.11)
RDW: 12.6 % (ref 11.5–15.5)
WBC: 13.8 10*3/uL — ABNORMAL HIGH (ref 4.0–10.5)
nRBC: 0 % (ref 0.0–0.2)

## 2020-06-13 LAB — HEMOGLOBIN A1C
Hgb A1c MFr Bld: 7.3 % — ABNORMAL HIGH (ref 4.8–5.6)
Mean Plasma Glucose: 162.81 mg/dL

## 2020-06-13 LAB — GLUCOSE, CAPILLARY
Glucose-Capillary: 179 mg/dL — ABNORMAL HIGH (ref 70–99)
Glucose-Capillary: 253 mg/dL — ABNORMAL HIGH (ref 70–99)
Glucose-Capillary: 171 mg/dL — ABNORMAL HIGH (ref 70–99)
Glucose-Capillary: 90 mg/dL (ref 70–99)

## 2020-06-13 LAB — PROCALCITONIN: Procalcitonin: 0.56 ng/mL

## 2020-06-13 LAB — C-REACTIVE PROTEIN: CRP: 5 mg/dL — ABNORMAL HIGH (ref <1.0)

## 2020-06-13 LAB — D-DIMER, QUANTITATIVE: D-Dimer, Quant: 1.26 ug/mL-FEU — ABNORMAL HIGH (ref 0.00–0.50)

## 2020-06-13 MED ORDER — METFORMIN HCL 500 MG PO TABS
500.0000 mg | ORAL_TABLET | Freq: Two times a day (BID) | ORAL | Status: DC
  Administered 2020-06-13 – 2020-06-18 (×7): 500 mg via ORAL
  Filled 2020-06-13 (×10): qty 1

## 2020-06-13 MED ORDER — ENSURE ENLIVE PO LIQD
237.0000 mL | Freq: Three times a day (TID) | ORAL | Status: DC
  Administered 2020-06-13 – 2020-06-18 (×11): 237 mL via ORAL

## 2020-06-13 NOTE — Evaluation (Signed)
Physical Therapy Evaluation Patient Details Name: Megan [name] MRN: [id number] DOB: [birth year] Today's Date: 06/13/2020   History of Present Illness  69 year old with a history of COPD on home oxygen, GERD, and HTN who presented to the ED 8/10 with 3-4 days of worsening SOB with a productive cough and left-sided pleuritic chest pain, dx COVID positive.    Clinical Impression  Pt admitted with above diagnosis. PTA pt lived with her daughter, mod I mobility and ADLs using cane. On eval, she required min assist bed mobility, transfers, and ambulation 20' HHA. Pt on 4L O2 with desat to 87%. Pt very anxious, limiting her participation. Pt will benefit from skilled PT to increase their independence and safety with mobility to allow discharge to the venue listed below.       Follow Up Recommendations SNF;Supervision/Assistance - 24 hour    Equipment Recommendations  None recommended by PT    Recommendations for Other Services       Precautions / Restrictions Precautions Precautions: Fall;Other (comment) Precaution Comments: high anxiety      Mobility  Bed Mobility Overal bed mobility: Needs Assistance Bed Mobility: Supine to Sit     Supine to sit: Min assist;HOB elevated     General bed mobility comments: +rail, increased time  Transfers Overall transfer level: Needs assistance Equipment used: 1 person hand held assist Transfers: Sit to/from [facility] Sit to Stand: Min assist Stand pivot transfers: Min assist       General transfer comment: cues for sequencing, increased time  Ambulation/Gait Ambulation/Gait assistance: Min assist Gait Distance (Feet): 20 Feet Assistive device: 1 person hand held assist Gait Pattern/deviations: Step-through pattern;Decreased stride length Gait velocity: very slow Gait velocity interpretation: <1.8 ft/sec, indicate of risk for recurrent falls General Gait Details: Mobilized on 4L. Desat to 87%.  Stairs             Wheelchair Mobility    Modified Rankin (Stroke Patients Only)       Balance Overall balance assessment: Needs assistance Sitting-balance support: No upper extremity supported;Feet supported Sitting balance-Leahy Scale: Fair     Standing balance support: Single extremity supported;During functional activity Standing balance-Leahy Scale: Poor Standing balance comment: reliant on external support                             Pertinent Vitals/Pain Pain Assessment: No/denies pain    Home Living Family/patient expects to be discharged to:: Private residence Living Arrangements: Children Available Help at Discharge: Family;Available PRN/intermittently Type of Home: House Home Access: Stairs to enter Entrance Stairs-Rails: None Entrance Stairs-Number of Steps: 2 Home Layout: One level Home Equipment: Cane - single point;Walker - 4 wheels;Other (comment) Additional Comments: home O2 (2L)    Prior Function Level of Independence: Independent with assistive device(s)         Comments: cane for amb, sponge bathes. Lives with daughter and 2 special needs grandchildren. Pt's daughter is pregant, expecting her 3rd child any day now.     Hand Dominance   Dominant Hand: Right    Extremity/Trunk Assessment   Upper Extremity Assessment Upper Extremity Assessment: Generalized weakness    Lower Extremity Assessment Lower Extremity Assessment: Generalized weakness    Cervical / Trunk Assessment Cervical / Trunk Assessment: Kyphotic  Communication   Communication: No difficulties  Cognition Arousal/Alertness: Awake/alert Behavior During Therapy: Anxious Overall Cognitive Status: No family/caregiver present to determine baseline cognitive functioning Area of Impairment: Attention;Problem solving  Current Attention Level: Selective         Problem Solving: Slow processing;Difficulty sequencing;Requires verbal cues General Comments:  easily distracted. Very anxious. Tangential speech.      General Comments General comments (skin integrity, edema, etc.): Pt on 4L continuous O2. SpO2 98% at rest. Desat to 87% during mobility.    Exercises     Assessment/Plan    PT Assessment Patient needs continued PT services  PT Problem List Decreased strength;Decreased mobility;Decreased activity tolerance;Cardiopulmonary status limiting activity;Decreased balance       PT Treatment Interventions DME instruction;Therapeutic activities;Cognitive remediation;Gait training;Therapeutic exercise;Patient/family education;Balance training;Functional mobility training    PT Goals (Current goals can be found in the Care Plan section)  Acute Rehab PT Goals Patient Stated Goal: get stronger PT Goal Formulation: With patient Time For Goal Achievement: 06/27/20 Potential to Achieve Goals: Good    Frequency Min 3X/week   Barriers to discharge Decreased caregiver support      Co-evaluation               AM-PAC PT "6 Clicks" Mobility  Outcome Measure Help needed turning from your back to your side while in a flat bed without using bedrails?: None Help needed moving from lying on your back to sitting on the side of a flat bed without using bedrails?: A Little Help needed moving to and from a bed to a chair (including a wheelchair)?: A Little Help needed standing up from a chair using your arms (e.g., wheelchair or bedside chair)?: A Little Help needed to walk in hospital room?: A Little Help needed climbing 3-5 steps with a railing? : A Lot 6 Click Score: 18    End of Session Equipment Utilized During Treatment: Gait belt;Oxygen Activity Tolerance: Other (comment) (limited by anxiety) Patient left: in chair;with call bell/phone within reach Nurse Communication: Mobility status PT Visit Diagnosis: Unsteadiness on feet (R26.81);Muscle weakness (generalized) (M62.81);Difficulty in walking, not elsewhere classified (R26.2)     Time: 9201-0071 PT Time Calculation (min) (ACUTE ONLY): 38 min   Charges:   PT Evaluation $PT Eval Moderate Complexity: 1 Mod PT Treatments $Gait Training: 23-37 mins        Lorrin Goodell, PT  Office # [phone] Pager [phone]   [name] 06/13/2020, 1:06 PM

## 2020-06-13 NOTE — Telephone Encounter (Signed)
Please refill as per office routine med refill policy (all routine meds refilled for 3 mo or monthly per pt preference up to one year from last visit, then month to month grace period for 3 mo, then further med refills will have to be denied)  

## 2020-06-13 NOTE — Evaluation (Signed)
Clinical/Bedside Swallow Evaluation Patient Details  Name: Megan [name] MRN: [id number] Date of Birth: Sep 02, 1951  Today's Date: 06/13/2020 Time: SLP Start Time (ACUTE ONLY): 1137 SLP Stop Time (ACUTE ONLY): 1202 SLP Time Calculation (min) (ACUTE ONLY): 25 min  Past Medical History:  Past Medical History:  Diagnosis Date  . Anemia   . Anxiety state 08/20/2015  . CAP (community acquired pneumonia)   . CHF (congestive heart failure) ([facility])   . COPD (chronic obstructive pulmonary disease) ([employer])   . Depression   . Essential hypertension 08/19/2015  . GERD (gastroesophageal reflux disease)   . Headache   . History of hiatal hernia   . Myocardial infarction ([place])   . Shortness of breath dyspnea    Past Surgical History:  Past Surgical History:  Procedure Laterality Date  . CARDIAC CATHETERIZATION    . CARDIAC CATHETERIZATION N/A 08/26/2015   Procedure: Left Heart Cath and Coronary Angiography;  Surgeon: Jettie Booze, MD;  Location: [facility];  Service: Cardiovascular;  Laterality: N/A;  . ESOPHAGOGASTRODUODENOSCOPY N/A 04/18/2018   Procedure: ESOPHAGOGASTRODUODENOSCOPY (EGD);  Surgeon: Ladene Artist, MD;  Location: [facility] ENDOSCOPY;  Service: Endoscopy;  Laterality: N/A;  . tracheostomy     HPI:  69 year old with a history of COPD on home oxygen, GERD, and HTN who presented to the ED 8/10 with 3-4 days of worsening SOB with a productive cough and left-sided pleuritic chest pain, dx COVID positive.   CXR confirmed bilateral pleural effusions with infiltrates diffusely; 2.8 x 2.8cm cavitation in the left upper lobe. CT confirms this finding and suggested is most consistent with a cavitating pneumonia. MD notes suggest she may suffer intermittent episodic aspiration related to esophageal spasming and or stricture.  Pt reports a prior history of esophageal dilatations and occasional episodes of "food sticking in her throat." Chart review reveals an esophagram 04/17/28: "Esophagus  with stasis of contrast within the diverticulum. 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  Pt is known to SLP services from prior admissions and recurring dysphagias related to prolonged intubations, trach, and COPD exacerbations.     Assessment / Plan / Recommendation Clinical Impression  Pt presents with no clinical signs or symptoms of an oropharyngeal dysphagia - oral mechanism exam is normal; mastication is thorough and efficient; swallow is brisk; there are no overt s/s of aspiration.  There is adequate swallowing/respiratory synchrony.  Given esophageal hx with dx of distended esophagus and primary dismotility disorder, it is more likely that aspiration, if present, is post-prandial.  Recommend continuing current diet (regular/thin liquids), sit upright 30-40 minutes after meals; keep HOB elevated while sleeping; avoid eating/drinking <two hours before bedtime.  Reiterated strategies with pt.  No further acute care SLP needs are identified.  Our service will sign off.  SLP Visit Diagnosis: Dysphagia, unspecified (R13.10)           Diet Recommendation   regular solids, thin liquids  Medication Administration: Whole meds with liquid    Other  Recommendations Oral Care Recommendations: Oral care BID   Follow up Recommendations None      Frequency and Duration            Prognosis        Swallow Study   General HPI: 69 year old with a history of COPD on home oxygen, GERD, and HTN who presented  to the ED 8/10 with 3-4 days of worsening SOB with a productive cough and left-sided pleuritic chest pain, dx COVID positive.   CXR confirmed bilateral pleural effusions with infiltrates diffusely; 2.8 x 2.8cm cavitation in the left upper lobe. CT confirms this finding and suggested is most  consistent with a cavitating pneumonia. MD notes suggest she may suffer intermittent episodic aspiration related to esophageal spasming and or stricture.  Pt reports a prior history of esophageal dilatations and occasional episodes of "food sticking in her throat." Chart review reveals an esophagram 04/17/28: "Esophagus with stasis of contrast within the diverticulum. 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  Pt is known to SLP services from prior admissions and recurring dysphagias related to prolonged intubations, trach, and COPD exacerbations.   Type of Study: Bedside Swallow Evaluation Previous Swallow Assessment: see HPI Diet Prior to this Study: Regular;Thin liquids Temperature Spikes Noted: No Respiratory Status: Nasal cannula History of Recent Intubation: No Behavior/Cognition: Alert;Cooperative;Confused Oral Cavity Assessment: Within Functional Limits Oral Care Completed by SLP: No Oral Cavity - Dentition: Dentures, top;Dentures, bottom Vision: Functional for self-feeding Self-Feeding Abilities: Needs set up Patient Positioning: Upright in chair Baseline Vocal Quality: Normal Volitional Cough: Strong Volitional Swallow: Able to elicit    Oral/Motor/Sensory Function Overall Oral Motor/Sensory Function: Within functional limits   Ice Chips Ice chips: Within functional limits   Thin Liquid Thin Liquid: Within functional limits    Nectar Thick Nectar Thick Liquid: Not tested   Honey Thick Honey Thick Liquid: Not tested   Puree Puree: Within functional limits   Solid     Solid: Within functional limits      [name] 06/13/2020,12:23 PM   Estill Bamberg L. Tivis Ringer, [place] Office number [phone] Pager [phone]

## 2020-06-13 NOTE — Progress Notes (Signed)
Initial Nutrition Assessment  DOCUMENTATION CODES:   Underweight  INTERVENTION:  Provide Ensure Enlive po TID, each supplement provides 350 kcal and 20 grams of protein.  Encourage adequate PO intake.   NUTRITION DIAGNOSIS:   Increased nutrient needs related to chronic illness (COPD, CHF) as evidenced by estimated needs.  GOAL:   Patient will meet greater than or equal to 90% of their needs  MONITOR:   PO intake, Supplement acceptance, Skin, Weight trends, Labs, I & O's  REASON FOR ASSESSMENT:   Malnutrition Screening Tool    ASSESSMENT:   69 y.o. female with history of severe COPD, CHF, hypertension presents with worsening shortness of breath, cough. CXR confirmed bilateral pleural effusions with infiltrates diffusely. Pt COVID positive.  RD working remotely.  RD attempted to contact pt via inpatient room phone, however pt unavailable. Meal completion has been 25-50%. Pt with no significant weight loss per weight records. RD to order nutritional supplements to aid in caloric and protein needs. Unable to complete Nutrition-Focused physical exam at this time.   Labs and medications reviewed.   Diet Order:   Diet Order            Diet regular Room service appropriate? Yes; Fluid consistency: Thin  Diet effective now                 EDUCATION NEEDS:   Not appropriate for education at this time  Skin:  Skin Assessment: Reviewed RN Assessment  Last BM:  8/12  Height:   Ht Readings from Last 1 Encounters:  06/12/20 5\' 3"  (1.6 m)    Weight:   Wt Readings from Last 1 Encounters:  06/12/20 40.5 kg    Ideal Body Weight:  52.27 kg  BMI:  Body mass index is 15.82 kg/m.  Estimated Nutritional Needs:   Kcal:  1500-1800  Protein:  70-90 grams  Fluid:  >/= 1.5 L/day  Corrin Parker, MS, RD, LDN RD pager number/after hours weekend pager number on Amion.

## 2020-06-13 NOTE — Progress Notes (Signed)
8/13 3:45 pm, CSW attempted to call patient (X4), room number busy.   4:00 pm CSW spoke with OT, stated they were working with pt and requested CSW call back in about 15 mins. CSW called back at 4:20 pm (X5) phone either rings or continues to be busy.   WEEKEND CSW TO FOLLOW-UP.   Dionisio Paschal, LCSWA

## 2020-06-13 NOTE — Progress Notes (Addendum)
Occupational Therapy Evaluation Patient Details Name: Megan [name] MRN: [id number] DOB: 1951-08-03 Today's Date: 06/13/2020    History of Present Illness 69 year old with a history of COPD on home oxygen, GERD, and HTN who presented to the ED 8/10 with 3-4 days of worsening SOB with a productive cough and left-sided pleuritic chest pain, dx COVID positive.   Clinical Impression   PTA, pt lived with her daughter and was independent with ADL and mobility. Pt states she was on 2L at baseline. Difficulty achieving good pleth, however SpO2 appears to remain in the 90s on 3L for mobility and ADL @ bathroom level. Pt reports extreme "fatigue". Pt demonstrates a decline in functional level of independence requiring min A for mobility and ADL. Feel pt will benefit from rehab at SNF, which pt is agreeable to . Will follow acutely. Encourage pt OOB daily and to ambulate short distances with staff using RW.     Follow Up Recommendations  SNF;Supervision/Assistance - 24 hour    Equipment Recommendations  3 in 1 bedside commode    Recommendations for Other Services       Precautions / Restrictions Precautions Precautions: Fall;Other (comment) Precaution Comments: high anxiety      Mobility Bed Mobility Overal bed mobility: Needs Assistance Bed Mobility: Supine to Sit;Sit to Supine     Supine to sit: Supervision;HOB elevated Sit to supine: Supervision;HOB elevated   General bed mobility comments: +rail, increased time  Transfers Overall transfer level: Needs assistance Equipment used: 1 person hand held assist Transfers: Sit to/from [facility] Sit to Stand: Min guard Stand pivot transfers: Min guard       General transfer comment: cues for sequencing, increased time    Balance Overall balance assessment: Needs assistance Sitting-balance support: No upper extremity supported;Feet supported Sitting balance-Leahy Scale: Fair     Standing balance support: Single  extremity supported;During functional activity Standing balance-Leahy Scale: Poor Standing balance comment: reliant on external support                           ADL either performed or assessed with clinical judgement   ADL Overall ADL's : Needs assistance/impaired     Grooming: Set up;Sitting   Upper Body Bathing: Set up;Sitting   Lower Body Bathing: Minimal assistance;Sit to/from stand   Upper Body Dressing : Minimal assistance;Sitting   Lower Body Dressing: Minimal assistance;Sit to/from stand   Toilet Transfer: Ambulation;RW;Min guard   Toileting- Clothing Manipulation and Hygiene: Minimal assistance;Sit to/from stand  Required vc to wipe her bottom  - distracted by being cold       Functional mobility during ADLs: Min guard;Rolling walker  I'm too tired, I can't wash up". Fatigue significantly limiting factor     Vision Baseline Vision/History: No visual deficits       Perception     Praxis      Pertinent Vitals/Pain Pain Assessment: 0-10 Pain Score: 8  Pain Location: L side Pain Descriptors / Indicators: Aching Pain Intervention(s): Limited activity within patient's tolerance     Hand Dominance Right   Extremity/Trunk Assessment Upper Extremity Assessment Upper Extremity Assessment: Generalized weakness   Lower Extremity Assessment Lower Extremity Assessment: Defer to PT evaluation   Cervical / Trunk Assessment Cervical / Trunk Assessment: Kyphotic   Communication Communication Communication: No difficulties   Cognition Arousal/Alertness: Awake/alert Behavior During Therapy: Anxious Overall Cognitive Status: No family/caregiver present to determine baseline cognitive functioning Area of Impairment: Attention;Safety/judgement;Awareness;Problem solving  Current Attention Level: Selective     Safety/Judgement: Decreased awareness of safety Awareness: Emergent Problem Solving: Slow processing  (tangential) General Comments: easily distracted. Very anxious. Tangential speech.   General Comments  Pt on 3L continuous O2. SpO2 in 90s during mobility however poor pleth. Pt did not appear to be in any distress.     Exercises     Shoulder Instructions      Home Living Family/patient expects to be discharged to:: Private residence Living Arrangements: Children Available Help at Discharge: Family;Available PRN/intermittently Type of Home: House Home Access: Stairs to enter [employer] of Steps: 2 Entrance Stairs-Rails: None Home Layout: One level     Bathroom Shower/Tub: Teacher, early years/pre: Standard     Home Equipment: Cane - single point;Walker - 4 wheels;Other (comment)          Prior Functioning/Environment Level of Independence: Independent with assistive device(s)        Comments: cane for amb, sponge bathes. Lives with daughter and 2 special needs grandchildren. Pt's daughter is pregant, expecting her 3rd child any day now.        OT Problem List: Decreased strength;Decreased activity tolerance;Impaired balance (sitting and/or standing);Decreased safety awareness;Decreased cognition;Cardiopulmonary status limiting activity;Pain      OT Treatment/Interventions: Self-care/ADL training;Therapeutic exercise;Neuromuscular education;Energy conservation;DME and/or AE instruction;Therapeutic activities;Cognitive remediation/compensation;Patient/family education;Balance training    OT Goals(Current goals can be found in the care plan section) Acute Rehab OT Goals Patient Stated Goal: get some rehab before she goes home OT Goal Formulation: With patient Time For Goal Achievement: 06/27/20 Potential to Achieve Goals: Good  OT Frequency: Min 2X/week   Barriers to D/C:            Co-evaluation              AM-PAC OT "6 Clicks" Daily Activity     Outcome Measure Help from another person eating meals?: None Help from another  person taking care of personal grooming?: A Little Help from another person toileting, which includes using toliet, bedpan, or urinal?: A Little Help from another person bathing (including washing, rinsing, drying)?: A Little Help from another person to put on and taking off regular upper body clothing?: A Little Help from another person to put on and taking off regular lower body clothing?: A Little 6 Click Score: 19   End of Session Equipment Utilized During Treatment: Rolling walker;Oxygen (3L for ambulation) Nurse Communication: Mobility status  Activity Tolerance: Patient tolerated treatment well Patient left: in bed;with call bell/phone within reach;with bed alarm set  OT Visit Diagnosis: Unsteadiness on feet (R26.81);Muscle weakness (generalized) (M62.81);Other symptoms and signs involving cognitive function;Pain Pain - Right/Left: Right Pain - part of body:  (flank)                Time: 1550-1620 OT Time Calculation (min): 30 min Charges:  OT General Charges $OT Visit: 1 Visit OT Evaluation $OT Eval Moderate Complexity: 1 Mod OT Treatments $Self Care/Home Management : 8-22 mins  Maurie Boettcher, OT/L   Acute OT Clinical Specialist [place] Pager [phone] Office [phone]   [facility] 06/13/2020, 4:31 PM

## 2020-06-13 NOTE — Progress Notes (Signed)
Megan [name]  PNT:[id number] DOB: 08-08-1951 DOA: 06/10/2020 PCP: [name], MD    Brief Narrative:  69 year old with a history of COPD on home oxygen at 2lpm and HTN who presented to the ED with 3-4 days of worsening shortness of breath with a productive cough and left-sided pleuritic chest pain.  She has not been vaccinated against COVID-19, and has had other family members in her home recently who were sick.  At presentation the patient was requiring a nonrebreather but was able to be de-escalated to 4 L nasal cannula support.  CXR confirmed bilateral pleural effusions with infiltrates diffusely.  The patient's Covid test was positive.  She was placed on IV Solu-Medrol and remdesivir.  Significant Events: 8/10 presented to [facility] ED 8/11 admitted via [facility] ED  Date of Positive COVID Test: 06/10/2020  Vaccination Status: Unvaccinated  COVID-19 specific Treatment: Remdesivir 8/10 > 8/14 Solu-Medrol 8/10 >  Antimicrobials:  Levaquin 8/10 > 8/14  Subjective: Oxygen saturations have stabilized.  Vital signs are stable. She remains deconditioned and weak. Her intake is improving. She reports intermittent migratory chest wall pain and cough.   Assessment & Plan:  COVID Pneumonia - acute on chronic hypoxic respiratory failure Continue Solu-Medrol and Remdesivir - Actemra not dosed due to concern for concomitant bacterial pneumonia with elevated procalcitonin - inflammatory markers continue to improve  Recent Labs  Lab 06/10/20 1944 06/10/20 2131 06/12/20 0403 06/13/20 0600  DDIMER  --  2.78* 1.84* 1.26*  FERRITIN  --  208 168  --   CRP 19.0*  --  9.8* 5.0*  ALT  --   --  18 13  PROCALCITON  --  2.77  --  0.56    Bilateral lower lobe airway occlusions Noted on CTa chest - suggestive of mucous plugging or aspiration -SLP evaluation noted no suggestion of dysphagia - suspect pt suffers w/ intermittent aspiration due to esophageal regurgitation   Esophageal diverticulum, HH and  dysmotility  Noted on GI eval June 2019 - does not appear as though pt has had GI f/u since that time, as was suggested at time of d/c - this should be considered once she is recovered from CoViD  Significantly elevated D-dimer CT angio chest without evidence of PE -D-dimer now trending downward -likely simply an acute phase reactant in setting of Covid -if begins to climb again will check lower extremity duplex  2.8 x 2.8cm cavitation in the left upper lobe Noted on admit CXR - CT confirms this finding and suggested is most consistent with a cavitating pneumonia -follow-up CT imaging will be required to assure this resolves and if it does not malignancy will have to be considered -continue empiric Levaquin -suspect she may suffer intermittent episodic aspiration related to esophageal spasming and or dysmotility  Chronic hypoxic respiratory failure due to moderate to severe COPD Requires home O2 support at 2 L/min nasal cannula -no wheezing on exam -presently requiring only slightly more than her usual baseline oxygen support  Mild hyponatremia Likely reflective of dehydration - corrected with gentle volume resuscitation  Newly diagnosed uncontrolled DM with hyperglycemia A1c 7.3 this admission - A1c 6.05 April 2019 - initiate Glucophage - educate patient -wean steroids as rapidly as possible  HTN Blood pressure well controlled  Normocytic anemia No evidence of gross blood loss -anemia panel without evidence of severe iron deficiency and with adequate B12 and folate levels  DVT prophylaxis: Lovenox Code Status: FULL CODE Family Communication:  Status is: Inpatient  Remains inpatient appropriate  because:Inpatient level of care appropriate due to severity of illness   Dispo: The patient is from: Home              Anticipated d/c is to: Home              Anticipated d/c date is: 2 days              Patient currently is not medically stable to d/c.  May need to investigate a rehab stay  within a skilled nursing facility prior to returning home.  Consultants:  none  Objective: Blood pressure 104/60, pulse 66, temperature 97.7 F (36.5 C), temperature source Oral, resp. rate 18, height 5\' 3"  (1.6 m), weight 40.5 kg, SpO2 100 %.  Intake/Output Summary (Last 24 hours) at 06/13/2020 1022 Last data filed at 06/12/2020 1837 Gross per 24 hour  Intake 680 ml  Output --  Net 680 ml   Filed Weights   06/11/20 0100 06/12/20 1109  Weight: 40.8 kg 40.5 kg    Examination: General: No acute respiratory distress Lungs: Fine crackles bilateral bases - no wheezing  Cardiovascular: RRR Abdomen: NT/ND, soft, bs+, no mass  Extremities: No C/C/E B LE    CBC: Recent Labs  Lab 06/10/20 1551 06/12/20 0403 06/13/20 0600  WBC 6.3 13.8* 13.8*  NEUTROABS  --  12.4* 12.6*  HGB 11.9* 11.6* 11.0*  HCT 39.8 38.1 36.3  MCV 98.5 98.2 98.9  PLT 354 361 267   Basic Metabolic Panel: Recent Labs  Lab 06/10/20 1551 06/12/20 0403 06/13/20 0600  NA 132* 135 136  K 4.0 4.8 5.1  CL 82* 85* 85*  CO2 33* 41* 42*  GLUCOSE 60* 280* 169*  BUN 16 27* 46*  CREATININE 1.01* 0.88 0.96  CALCIUM 8.2* 8.5* 9.1   GFR: Estimated Creatinine Clearance: 36.4 mL/min (by C-G formula based on SCr of 0.96 mg/dL).  Liver Function Tests: Recent Labs  Lab 06/12/20 0403 06/13/20 0600  AST 22 16  ALT 18 13  ALKPHOS 52 51  BILITOT 0.6 0.3  PROT 5.6* 5.3*  ALBUMIN 2.5* 2.3*   HbA1C: Hgb A1c MFr Bld  Date/Time Value Ref Range Status  06/13/2020 06:00 AM 7.3 (H) 4.8 - 5.6 % Final    Comment:    (NOTE) Pre diabetes:          5.7%-6.4%  Diabetes:              >6.4%  Glycemic control for   <7.0% adults with diabetes   04/19/2019 03:37 PM 6.4 4.6 - 6.5 % Final    Comment:    Glycemic Control Guidelines for People with Diabetes:Non Diabetic:  <6%Goal of Therapy: <7%Additional Action Suggested:  >8%     CBG: Recent Labs  Lab 06/10/20 1830 06/12/20 1147 06/12/20 1604 06/12/20 2120  06/13/20 0721  GLUCAP 75 264* 494* 393* 179*    Recent Results (from the past 240 hour(s))  SARS Coronavirus 2 by RT PCR (hospital order, performed in [place] hospital lab) Nasopharyngeal Nasopharyngeal Swab     Status: Abnormal   Collection Time: 06/10/20  3:48 PM   Specimen: Nasopharyngeal Swab  Result Value Ref Range Status   SARS Coronavirus 2 POSITIVE (A) NEGATIVE Final    Comment: RESULT CALLED TO, READ BACK BY AND VERIFIED WITH: L,CHILTON @1803  06/10/20 EB (NOTE) SARS-CoV-2 target nucleic acids are DETECTED  SARS-CoV-2 RNA is generally detectable in upper respiratory specimens  during the acute phase of infection.  Positive results are indicative  of the presence of the identified virus, but do not rule out bacterial infection or co-infection with other pathogens not detected by the test.  Clinical correlation with patient history and  other diagnostic information is necessary to determine patient infection status.  The expected result is negative.  Fact Sheet for Patients:   StrictlyIdeas.no   Fact Sheet for Healthcare Providers:   BankingDealers.co.za    This test is not yet approved or cleared by the [place] FDA and  has been authorized for detection and/or diagnosis of SARS-CoV-2 by FDA under an Emergency Use Authorization (EUA).  This EUA will remain in effect (meaning this test can b e used) for the duration of  the COVID-19 declaration under Section 564(b)(1) of the Act, 21 U.S.C. section 360-bbb-3(b)(1), unless the authorization is terminated or revoked sooner.  Performed at [facility], [place] [address] [place], [place] [address]   Blood Culture (routine x 2)     Status: None (Preliminary result)   Collection Time: 06/10/20  7:47 PM   Specimen: BLOOD  Result Value Ref Range Status   Specimen Description BLOOD LEFT ANTECUBITAL  Final   Special Requests   Final    BOTTLES DRAWN AEROBIC AND  ANAEROBIC Blood Culture results may not be optimal due to an inadequate volume of blood received in culture bottles   Culture   Final    NO GROWTH 3 DAYS Performed at [facility], [place] [address] [place], [place] [address]    Report Status PENDING  Incomplete  Blood Culture (routine x 2)     Status: None (Preliminary result)   Collection Time: 06/10/20  9:22 PM   Specimen: BLOOD RIGHT HAND  Result Value Ref Range Status   Specimen Description BLOOD RIGHT HAND  Final   Special Requests   Final    BOTTLES DRAWN AEROBIC AND ANAEROBIC Blood Culture adequate volume   Culture   Final    NO GROWTH 3 DAYS Performed at [facility], [place] [address], [place], [place] [address]    Report Status PENDING  Incomplete     Scheduled Meds: . aspirin EC  81 mg Oral Daily  . azelastine  1 spray Each Nare BID  . diltiazem  240 mg Oral Daily  . enoxaparin (LOVENOX) injection  30 mg Subcutaneous Daily  . fluticasone  2 spray Each Nare Daily  . fluticasone furoate-vilanterol  1 puff Inhalation Daily  . insulin aspart  0-5 Units Subcutaneous QHS  . insulin aspart  0-9 Units Subcutaneous TID WC  . insulin aspart  3 Units Subcutaneous TID WC  . insulin glargine  14 Units Subcutaneous QHS  . lidocaine  1 patch Transdermal Q24H  . loratadine  10 mg Oral Daily  . melatonin  3 mg Oral QHS  . methylPREDNISolone (SOLU-MEDROL) injection  20 mg Intravenous Q12H  . metoprolol tartrate  12.5 mg Oral BID  . montelukast  10 mg Oral QHS  . pantoprazole  40 mg Oral Daily  . umeclidinium bromide  1 puff Inhalation Daily   Continuous Infusions: . levofloxacin (LEVAQUIN) IV 750 mg (06/13/20 0515)  . remdesivir 100 mg in NS 100 mL 100 mg (06/13/20 1000)     LOS: 3 days   Cherene Altes, MD [facility] Office  [phone] Pager - Text Page per Amion  If 7PM-7AM, please contact night-coverage per Amion 06/13/2020, 10:22 AM

## 2020-06-14 DIAGNOSIS — J1282 Pneumonia due to coronavirus disease 2019: Secondary | ICD-10-CM

## 2020-06-14 DIAGNOSIS — J9611 Chronic respiratory failure with hypoxia: Secondary | ICD-10-CM

## 2020-06-14 DIAGNOSIS — J9612 Chronic respiratory failure with hypercapnia: Secondary | ICD-10-CM

## 2020-06-14 LAB — CBC WITH DIFFERENTIAL/PLATELET
Abs Immature Granulocytes: 0.08 10*3/uL — ABNORMAL HIGH (ref 0.00–0.07)
Basophils Absolute: 0 10*3/uL (ref 0.0–0.1)
Basophils Relative: 0 %
Eosinophils Absolute: 0 10*3/uL (ref 0.0–0.5)
Eosinophils Relative: 0 %
HCT: 36.5 % (ref 36.0–46.0)
Hemoglobin: 11 g/dL — ABNORMAL LOW (ref 12.0–15.0)
Immature Granulocytes: 1 %
Lymphocytes Relative: 2 %
Lymphs Abs: 0.2 10*3/uL — ABNORMAL LOW (ref 0.7–4.0)
MCH: 29.6 pg (ref 26.0–34.0)
MCHC: 30.1 g/dL (ref 30.0–36.0)
MCV: 98.1 fL (ref 80.0–100.0)
Monocytes Absolute: 0.3 10*3/uL (ref 0.1–1.0)
Monocytes Relative: 3 %
Neutro Abs: 12.9 10*3/uL — ABNORMAL HIGH (ref 1.7–7.7)
Neutrophils Relative %: 94 %
Platelets: 400 10*3/uL (ref 150–400)
RBC: 3.72 MIL/uL — ABNORMAL LOW (ref 3.87–5.11)
RDW: 12.8 % (ref 11.5–15.5)
WBC: 13.6 10*3/uL — ABNORMAL HIGH (ref 4.0–10.5)
nRBC: 0 % (ref 0.0–0.2)

## 2020-06-14 LAB — COMPREHENSIVE METABOLIC PANEL
ALT: 17 U/L (ref 0–44)
AST: 25 U/L (ref 15–41)
Albumin: 2.2 g/dL — ABNORMAL LOW (ref 3.5–5.0)
Alkaline Phosphatase: 55 U/L (ref 38–126)
Anion gap: 11 (ref 5–15)
BUN: 47 mg/dL — ABNORMAL HIGH (ref 8–23)
CO2: 35 mmol/L — ABNORMAL HIGH (ref 22–32)
Calcium: 8.9 mg/dL (ref 8.9–10.3)
Chloride: 83 mmol/L — ABNORMAL LOW (ref 98–111)
Creatinine, Ser: 1.05 mg/dL — ABNORMAL HIGH (ref 0.44–1.00)
GFR calc Af Amer: >60 mL/min (ref >60)
GFR calc non Af Amer: 55 mL/min — ABNORMAL LOW (ref >60)
Glucose, Bld: 435 mg/dL — ABNORMAL HIGH (ref 70–99)
Potassium: 5.3 mmol/L — ABNORMAL HIGH (ref 3.5–5.1)
Sodium: 129 mmol/L — ABNORMAL LOW (ref 135–145)
Total Bilirubin: 0.6 mg/dL (ref 0.3–1.2)
Total Protein: 5 g/dL — ABNORMAL LOW (ref 6.5–8.1)

## 2020-06-14 LAB — PROCALCITONIN: Procalcitonin: 0.1 ng/mL

## 2020-06-14 LAB — D-DIMER, QUANTITATIVE: D-Dimer, Quant: 1.39 ug/mL-FEU — ABNORMAL HIGH (ref 0.00–0.50)

## 2020-06-14 LAB — C-REACTIVE PROTEIN: CRP: 3.5 mg/dL — ABNORMAL HIGH (ref <1.0)

## 2020-06-14 LAB — GLUCOSE, CAPILLARY
Glucose-Capillary: 263 mg/dL — ABNORMAL HIGH (ref 70–99)
Glucose-Capillary: 417 mg/dL — ABNORMAL HIGH (ref 70–99)
Glucose-Capillary: 171 mg/dL — ABNORMAL HIGH (ref 70–99)
Glucose-Capillary: 177 mg/dL — ABNORMAL HIGH (ref 70–99)

## 2020-06-14 MED ORDER — METHYLPREDNISOLONE SODIUM SUCC 40 MG IJ SOLR
20.0000 mg | Freq: Every day | INTRAMUSCULAR | Status: DC
  Filled 2020-06-14: qty 1

## 2020-06-14 MED ORDER — INSULIN GLARGINE 100 UNIT/ML ~~LOC~~ SOLN
25.0000 [IU] | Freq: Every day | SUBCUTANEOUS | Status: DC
  Administered 2020-06-14: 25 [IU] via SUBCUTANEOUS
  Filled 2020-06-14 (×3): qty 0.25

## 2020-06-14 MED ORDER — INSULIN ASPART 100 UNIT/ML ~~LOC~~ SOLN
20.0000 [IU] | Freq: Once | SUBCUTANEOUS | Status: AC
  Administered 2020-06-14: 20 [IU] via SUBCUTANEOUS

## 2020-06-14 NOTE — Progress Notes (Signed)
Megan [name]  UQJ:[id number] DOB: May 28, 1951 DOA: 06/10/2020 PCP: [name], MD    Brief Narrative:  69 year old with a history of COPD on home oxygen at 2lpm and HTN who presented to the ED with 3-4 days of worsening shortness of breath with a productive cough and left-sided pleuritic chest pain.  She has not been vaccinated against COVID-19, and has had other family members in her home recently who were sick.  At presentation the patient was requiring a nonrebreather but was able to be de-escalated to 4 L nasal cannula support.  CXR confirmed bilateral pleural effusions with infiltrates diffusely.  The patient's Covid test was positive.  She was placed on IV Solu-Medrol and remdesivir.  Significant Events: 8/10 presented to [facility] ED 8/11 admitted via [facility] ED  Date of Positive COVID Test: 06/10/2020  Vaccination Status: Unvaccinated  COVID-19 specific Treatment: Remdesivir 8/10 > 8/14 Solu-Medrol 8/10 >  Antimicrobials:  Levaquin 8/10 > 8/14  Subjective:  Patient in bed, appears comfortable, denies any headache, no fever, no chest pain or pressure, no shortness of breath , no abdominal pain. No focal weakness.  Assessment & Plan:  COVID Pneumonia - acute on chronic hypoxic respiratory failure Continue Solu-Medrol and Remdesivir - Actemra not dosed due to concern for concomitant bacterial pneumonia with elevated procalcitonin - inflammatory markers continue to improve and now on baseline 2 L nasal cannula oxygen.  Recent Labs  Lab 06/10/20 1944 06/10/20 2131 06/12/20 0403 06/13/20 0600  DDIMER  --  2.78* 1.84* 1.26*  FERRITIN  --  208 168  --   CRP 19.0*  --  9.8* 5.0*  ALT  --   --  18 13  PROCALCITON  --  2.77  --  0.56    Bilateral lower lobe airway occlusions Noted on CTa chest - suggestive of mucous plugging or aspiration -SLP evaluation noted no suggestion of dysphagia - suspect pt suffers w/ intermittent aspiration due to esophageal regurgitation, speech eval  here, outpatient GI and pulmonary follow-up.  Esophageal diverticulum, HH and dysmotility  Noted on GI eval June 2019 - does not appear as though pt has had GI f/u since that time, as was suggested at time of d/c - this should be considered once she is recovered from [place], outpatient GI follow-up post discharge.  Significantly elevated D-dimer - CT angio chest without evidence of PE -D-dimer now trending downward - likely simply an acute phase reactant in setting of Covid -if begins to climb again will check lower extremity duplex  2.8 x 2.8cm cavitation in the left upper lobe - Noted on admit CXR - CT confirms this finding and suggested is most consistent with a cavitating pneumonia -follow-up CT imaging will be required to assure this resolves and if it does not malignancy will have to be considered -received a short course of Levaquin with stop date of 06/13/2020-suspect she may suffer intermittent episodic aspiration related to esophageal spasming and or dysmotility, speech to evaluate, have her follow with pulmonary post discharge.  Chronic hypoxic respiratory failure due to moderate to severe COPD - Requires home O2 support at 2 L/min nasal cannula -no wheezing on exam -presently requiring only slightly more than her usual baseline oxygen support.  Mild hyponatremia - Likely reflective of dehydration - corrected with gentle volume resuscitation.  Newly diagnosed uncontrolled DM with hyperglycemia - A1c 7.3 this admission - A1c 6.05 April 2019 - initiate Glucophage - educate patient -wean steroids as rapidly as possible  HTN - Blood pressure  well controlled  Normocytic anemia - No evidence of gross blood loss -anemia panel without evidence of severe iron deficiency and with adequate B12 and folate levels, follow with PCP for age-appropriate outpatient work-up.    DVT prophylaxis: Lovenox Code Status: FULL CODE Family Communication: Daughter [name] on [phone] 06/14/2020 message left 10:40  AM.  Status is: Inpatient  Remains inpatient appropriate because:Inpatient level of care appropriate due to severity of illness   Dispo: The patient is from: Home              Anticipated d/c is to: Home              Anticipated d/c date is: 2 days              Patient currently is not medically stable to d/c.  May need to investigate a rehab stay within a skilled nursing facility prior to returning home.  Consultants:  none  Objective: Blood pressure (!) 106/55, pulse 73, temperature 97.7 F (36.5 C), temperature source Oral, resp. rate 18, height 5\' 3"  (1.6 m), weight 40.4 kg, SpO2 94 %.  Intake/Output Summary (Last 24 hours) at 06/14/2020 1038 Last data filed at 06/13/2020 2010 Gross per 24 hour  Intake 240 ml  Output --  Net 240 ml   Filed Weights   06/11/20 0100 06/12/20 1109 06/14/20 0409  Weight: 40.8 kg 40.5 kg 40.4 kg    Examination:  Awake Alert, No new F.N deficits, Normal affect .AT,PERRAL Supple Neck,No JVD, No cervical lymphadenopathy appriciated.  Symmetrical Chest wall movement, Good air movement bilaterally, CTAB RRR,No Gallops, Rubs or new Murmurs, No Parasternal Heave +ve B.Sounds, Abd Soft, No tenderness, No organomegaly appriciated, No rebound - guarding or rigidity. No Cyanosis, Clubbing or edema, No new Rash or bruise    CBC: Recent Labs  Lab 06/10/20 1551 06/12/20 0403 06/13/20 0600  WBC 6.3 13.8* 13.8*  NEUTROABS  --  12.4* 12.6*  HGB 11.9* 11.6* 11.0*  HCT 39.8 38.1 36.3  MCV 98.5 98.2 98.9  PLT 354 361 852   Basic Metabolic Panel: Recent Labs  Lab 06/10/20 1551 06/12/20 0403 06/13/20 0600  NA 132* 135 136  K 4.0 4.8 5.1  CL 82* 85* 85*  CO2 33* 41* 42*  GLUCOSE 60* 280* 169*  BUN 16 27* 46*  CREATININE 1.01* 0.88 0.96  CALCIUM 8.2* 8.5* 9.1   GFR: Estimated Creatinine Clearance: 36.3 mL/min (by C-G formula based on SCr of 0.96 mg/dL).  Liver Function Tests: Recent Labs  Lab 06/12/20 0403 06/13/20 0600  AST 22 16   ALT 18 13  ALKPHOS 52 51  BILITOT 0.6 0.3  PROT 5.6* 5.3*  ALBUMIN 2.5* 2.3*   HbA1C: Hgb A1c MFr Bld  Date/Time Value Ref Range Status  06/13/2020 06:00 AM 7.3 (H) 4.8 - 5.6 % Final    Comment:    (NOTE) Pre diabetes:          5.7%-6.4%  Diabetes:              >6.4%  Glycemic control for   <7.0% adults with diabetes   04/19/2019 03:37 PM 6.4 4.6 - 6.5 % Final    Comment:    Glycemic Control Guidelines for People with Diabetes:Non Diabetic:  <6%Goal of Therapy: <7%Additional Action Suggested:  >8%     CBG: Recent Labs  Lab 06/13/20 0721 06/13/20 1132 06/13/20 1552 06/13/20 2110 06/14/20 1010  GLUCAP 179* 253* 171* 90 263*    Recent Results (from the past  240 hour(s))  SARS Coronavirus 2 by RT PCR (hospital order, performed in [facility] hospital lab) Nasopharyngeal Nasopharyngeal Swab     Status: Abnormal   Collection Time: 06/10/20  3:48 PM   Specimen: Nasopharyngeal Swab  Result Value Ref Range Status   SARS Coronavirus 2 POSITIVE (A) NEGATIVE Final    Comment: RESULT CALLED TO, READ BACK BY AND VERIFIED WITH: L,CHILTON @1803  06/10/20 EB (NOTE) SARS-CoV-2 target nucleic acids are DETECTED  SARS-CoV-2 RNA is generally detectable in upper respiratory specimens  during the acute phase of infection.  Positive results are indicative  of the presence of the identified virus, but do not rule out bacterial infection or co-infection with other pathogens not detected by the test.  Clinical correlation with patient history and  other diagnostic information is necessary to determine patient infection status.  The expected result is negative.  Fact Sheet for Patients:   StrictlyIdeas.no   Fact Sheet for Healthcare Providers:   BankingDealers.co.za    This test is not yet approved or cleared by the [place] FDA and  has been authorized for detection and/or diagnosis of SARS-CoV-2 by FDA under an Emergency Use  Authorization (EUA).  This EUA will remain in effect (meaning this test can b e used) for the duration of  the COVID-19 declaration under Section 564(b)(1) of the Act, 21 U.S.C. section 360-bbb-3(b)(1), unless the authorization is terminated or revoked sooner.  Performed at [facility], [place] [address], [place], [place] [address]   Blood Culture (routine x 2)     Status: None (Preliminary result)   Collection Time: 06/10/20  7:47 PM   Specimen: BLOOD  Result Value Ref Range Status   Specimen Description BLOOD LEFT ANTECUBITAL  Final   Special Requests   Final    BOTTLES DRAWN AEROBIC AND ANAEROBIC Blood Culture results may not be optimal due to an inadequate volume of blood received in culture bottles   Culture   Final    NO GROWTH 3 DAYS Performed at [facility], [place] [address], [place], [place] [address]    Report Status PENDING  Incomplete  Blood Culture (routine x 2)     Status: None (Preliminary result)   Collection Time: 06/10/20  9:22 PM   Specimen: BLOOD RIGHT HAND  Result Value Ref Range Status   Specimen Description BLOOD RIGHT HAND  Final   Special Requests   Final    BOTTLES DRAWN AEROBIC AND ANAEROBIC Blood Culture adequate volume   Culture   Final    NO GROWTH 3 DAYS Performed at [facility], [place] [address] [place], [place] [address]    Report Status PENDING  Incomplete     Scheduled Meds: . aspirin EC  81 mg Oral Daily  . azelastine  1 spray Each Nare BID  . diltiazem  240 mg Oral Daily  . enoxaparin (LOVENOX) injection  30 mg Subcutaneous Daily  . feeding supplement (ENSURE ENLIVE)  237 mL Oral TID BM  . fluticasone  2 spray Each Nare Daily  . fluticasone furoate-vilanterol  1 puff Inhalation Daily  . insulin aspart  0-5 Units Subcutaneous QHS  . insulin aspart  0-9 Units Subcutaneous TID WC  . insulin aspart  3 Units Subcutaneous TID WC  . insulin glargine  14 Units Subcutaneous QHS  . lidocaine  1 patch Transdermal Q24H  .  loratadine  10 mg Oral Daily  . melatonin  3 mg Oral QHS  . metFORMIN  500 mg Oral BID WC  . methylPREDNISolone (SOLU-MEDROL) injection  20 mg Intravenous Q12H  . metoprolol tartrate  12.5 mg Oral BID  . montelukast  10 mg Oral QHS  . pantoprazole  40 mg Oral Daily  . umeclidinium bromide  1 puff Inhalation Daily   Continuous Infusions: . remdesivir 100 mg in NS 100 mL 100 mg (06/13/20 1000)     LOS: 4 days   Signature  Lala Lund M.D on 06/14/2020 at 10:39 AM   -  To page go to [email or web address]

## 2020-06-14 NOTE — Progress Notes (Signed)
SLP Cancellation Note  Patient Details Name: Ernesto Lashway MRN: [id number] DOB: [birth year]   Cancelled treatment:       Reason Eval/Treat Not Completed: Other (comment) (Patient evaluated for swallow via BSE on 8/13 and SLP s/o secondary to oropharyngeal swallow WNL. MD notified and in agreement not to complete second evaluation as there have been no significant changes since previous date. Thank you for this referral.)   Dannial Monarch 06/14/2020, 10:59 AM  Sonia Baller, MA, CCC-SLP Speech Therapy [facility] Acute Rehab Pager: [phone]

## 2020-06-14 NOTE — Progress Notes (Signed)
Patient voiced concerns that she lives with her daughter who has 3 children including a 67 year old. She is afraid of infecting them with covid.she stated that she does not feel ready for discharge tomorrow.

## 2020-06-15 LAB — COMPREHENSIVE METABOLIC PANEL
ALT: 15 U/L (ref 0–44)
AST: 15 U/L (ref 15–41)
Albumin: 2.1 g/dL — ABNORMAL LOW (ref 3.5–5.0)
Alkaline Phosphatase: 50 U/L (ref 38–126)
Anion gap: 5 (ref 5–15)
BUN: 34 mg/dL — ABNORMAL HIGH (ref 8–23)
CO2: 43 mmol/L — ABNORMAL HIGH (ref 22–32)
Calcium: 8.9 mg/dL (ref 8.9–10.3)
Chloride: 87 mmol/L — ABNORMAL LOW (ref 98–111)
Creatinine, Ser: 0.74 mg/dL (ref 0.44–1.00)
GFR calc Af Amer: >60 mL/min (ref >60)
GFR calc non Af Amer: >60 mL/min (ref >60)
Glucose, Bld: 44 mg/dL — CL (ref 70–99)
Potassium: 5 mmol/L (ref 3.5–5.1)
Sodium: 135 mmol/L (ref 135–145)
Total Bilirubin: 0.6 mg/dL (ref 0.3–1.2)
Total Protein: 4.8 g/dL — ABNORMAL LOW (ref 6.5–8.1)

## 2020-06-15 LAB — CULTURE, BLOOD (ROUTINE X 2)
Culture: NO GROWTH
Culture: NO GROWTH
Special Requests: ADEQUATE

## 2020-06-15 LAB — GLUCOSE, CAPILLARY
Glucose-Capillary: 288 mg/dL — ABNORMAL HIGH (ref 70–99)
Glucose-Capillary: 239 mg/dL — ABNORMAL HIGH (ref 70–99)
Glucose-Capillary: 173 mg/dL — ABNORMAL HIGH (ref 70–99)
Glucose-Capillary: 144 mg/dL — ABNORMAL HIGH (ref 70–99)

## 2020-06-15 LAB — CBC WITH DIFFERENTIAL/PLATELET
Abs Immature Granulocytes: 0.07 10*3/uL (ref 0.00–0.07)
Basophils Absolute: 0 10*3/uL (ref 0.0–0.1)
Basophils Relative: 0 %
Eosinophils Absolute: 0 10*3/uL (ref 0.0–0.5)
Eosinophils Relative: 0 %
HCT: 36.9 % (ref 36.0–46.0)
Hemoglobin: 11.1 g/dL — ABNORMAL LOW (ref 12.0–15.0)
Immature Granulocytes: 1 %
Lymphocytes Relative: 3 %
Lymphs Abs: 0.5 10*3/uL — ABNORMAL LOW (ref 0.7–4.0)
MCH: 29.2 pg (ref 26.0–34.0)
MCHC: 30.1 g/dL (ref 30.0–36.0)
MCV: 97.1 fL (ref 80.0–100.0)
Monocytes Absolute: 1.2 10*3/uL — ABNORMAL HIGH (ref 0.1–1.0)
Monocytes Relative: 8 %
Neutro Abs: 12.8 10*3/uL — ABNORMAL HIGH (ref 1.7–7.7)
Neutrophils Relative %: 88 %
Platelets: 376 10*3/uL (ref 150–400)
RBC: 3.8 MIL/uL — ABNORMAL LOW (ref 3.87–5.11)
RDW: 12.9 % (ref 11.5–15.5)
WBC: 14.6 10*3/uL — ABNORMAL HIGH (ref 4.0–10.5)
nRBC: 0 % (ref 0.0–0.2)

## 2020-06-15 LAB — MAGNESIUM: Magnesium: 1.7 mg/dL (ref 1.7–2.4)

## 2020-06-15 LAB — D-DIMER, QUANTITATIVE: D-Dimer, Quant: 1.23 ug/mL-FEU — ABNORMAL HIGH (ref 0.00–0.50)

## 2020-06-15 LAB — C-REACTIVE PROTEIN: CRP: 1.7 mg/dL — ABNORMAL HIGH (ref <1.0)

## 2020-06-15 MED ORDER — LACTATED RINGERS IV SOLN: INTRAVENOUS | Status: AC

## 2020-06-15 MED ORDER — DEXTROSE 50 % IV SOLN
INTRAVENOUS | Status: AC
  Administered 2020-06-15: 25 mL
  Filled 2020-06-15: qty 50

## 2020-06-15 MED ORDER — DEXTROSE 50 % IV SOLN: 25.0000 mL | Freq: Once | INTRAVENOUS | Status: AC

## 2020-06-15 MED ORDER — METHYLPREDNISOLONE SODIUM SUCC 40 MG IJ SOLR
10.0000 mg | Freq: Every day | INTRAMUSCULAR | Status: DC
  Administered 2020-06-15: 10 mg via INTRAVENOUS

## 2020-06-15 MED ORDER — SODIUM POLYSTYRENE SULFONATE 15 GM/60ML PO SUSP
30.0000 g | Freq: Once | ORAL | Status: AC
  Administered 2020-06-15: 30 g via ORAL
  Filled 2020-06-15: qty 120

## 2020-06-15 MED ORDER — INSULIN GLARGINE 100 UNIT/ML ~~LOC~~ SOLN
8.0000 [IU] | Freq: Every day | SUBCUTANEOUS | Status: DC
  Administered 2020-06-15: 8 [IU] via SUBCUTANEOUS
  Filled 2020-06-15 (×2): qty 0.08

## 2020-06-15 NOTE — Progress Notes (Signed)
Physical Therapy Treatment Patient Details Name: Megan [name] MRN: [id number] DOB: 1951/03/29 Today's Date: 06/15/2020    History of Present Illness Pt is a 69 y.o. female with PMH of COPD (2L home O2), GERD, HTN, who presented 06/10/20 with 3-4 days of worsening SOB with a productive cough and left-sided pleuritic chest pain. Workup for COVID-19. Pt not vaccinated.   PT Comments    Pt progressing with mobility. Today's session focused on transfer and gait training with RW. Pt moving well at supervision-level, min guard when performing standing ADL tasks. SpO2 >/94% on 2L O2 [place] with activity. Pt wears 2L O2 baseline. Discussed d/c recommendations. Pt declining SNF, therefore recommend HHPT services and RW for return home.   Follow Up Recommendations  SNF;Supervision - Intermittent (declined SNF, will need HHPT)     Equipment Recommendations  Rolling walker with 5" wheels    Recommendations for Other Services       Precautions / Restrictions Precautions Precautions: Fall;Other (comment) Precaution Comments: Wears 2L O2 baseline Restrictions Weight Bearing Restrictions: No    Mobility  Bed Mobility Overal bed mobility: Modified Independent Bed Mobility: Supine to Sit           General bed mobility comments: HOB slightly elevated  Transfers Overall transfer level: Needs assistance Equipment used: 1 person hand held assist;Rolling walker (2 wheeled) Transfers: Sit to/from Stand Sit to Stand: Min guard;Supervision         General transfer comment: Min guard for stand and pivot to [facility] due to urinary urgency; additional standing trials from bed and recliner to RW at supervision-level  Ambulation/Gait Ambulation/Gait assistance: Supervision Gait Distance (Feet): 90 Feet Assistive device: Rolling walker (2 wheeled) Gait Pattern/deviations: Step-through pattern;Decreased stride length;Trunk flexed Gait velocity: Decreased   General Gait Details: Slow, steady gait with RW  at supervision-level; SpO2 94-97% on 2L O2 [place]. Pt recognizes need for RW when given option to walk with or without it (uses SPC at home)   Chief Strategy Officer    Modified Rankin (Stroke Patients Only)       Balance Overall balance assessment: Needs assistance Sitting-balance support: No upper extremity supported;Feet supported Sitting balance-Leahy Scale: Good     Standing balance support: Single extremity supported;During functional activity;No upper extremity supported Standing balance-Leahy Scale: Fair Standing balance comment: Performing posterior pericare without UE support                            Cognition Arousal/Alertness: Awake/alert Behavior During Therapy: WFL for tasks assessed/performed Overall Cognitive Status: No family/caregiver present to determine baseline cognitive functioning Area of Impairment: Attention;Awareness;Problem solving                   Current Attention Level: Selective       Awareness: Emergent Problem Solving: Slow processing;Requires verbal cues General Comments: Apparent cognitive deficits likely exacerbated by [facility]      Exercises      General Comments General comments (skin integrity, edema, etc.): Discussed HH vs. SNF at d/c - pt initially reports it is up to her daughter, then reports she prefers to go home and declining SNF      Pertinent Vitals/Pain Pain Assessment: No/denies pain Pain Intervention(s): Monitored during session    Home Living                      Prior Function  PT Goals (current goals can now be found in the care plan section) Progress towards PT goals: Progressing toward goals    Frequency    Min 3X/week      PT Plan Discharge plan needs to be updated    Co-evaluation              AM-PAC PT "6 Clicks" Mobility   Outcome Measure  Help needed turning from your back to your side while in a flat bed without using  bedrails?: None Help needed moving from lying on your back to sitting on the side of a flat bed without using bedrails?: None Help needed moving to and from a bed to a chair (including a wheelchair)?: A Little Help needed standing up from a chair using your arms (e.g., wheelchair or bedside chair)?: A Little Help needed to walk in hospital room?: A Little Help needed climbing 3-5 steps with a railing? : A Little 6 Click Score: 20    End of Session Equipment Utilized During Treatment: Oxygen Activity Tolerance: Patient tolerated treatment well Patient left: in chair;with call bell/phone within reach Nurse Communication: Mobility status PT Visit Diagnosis: Unsteadiness on feet (R26.81);Muscle weakness (generalized) (M62.81);Difficulty in walking, not elsewhere classified (R26.2)     Time: 7078-6754 PT Time Calculation (min) (ACUTE ONLY): 24 min  Charges:  $Gait Training: 8-22 mins $Therapeutic Activity: 8-22 mins                     [name], PT, DPT Acute Rehabilitation Services  Pager [phone] Office [place] 06/15/2020, 4:36 PM

## 2020-06-15 NOTE — Progress Notes (Addendum)
Megan [name]  TDD:[id number] DOB: 06-15-1951 DOA: 06/10/2020 PCP: [name], MD    Brief Narrative:  69 year old with a history of COPD on home oxygen at 2lpm and HTN who presented to the ED with 3-4 days of worsening shortness of breath with a productive cough and left-sided pleuritic chest pain.  She has not been vaccinated against COVID-19, and has had other family members in her home recently who were sick.  At presentation the patient was requiring a nonrebreather but was able to be de-escalated to 4 L nasal cannula support.  CXR confirmed bilateral pleural effusions with infiltrates diffusely.  The patient's Covid test was positive.  She was placed on IV Solu-Medrol and remdesivir.  Significant Events: 8/10 presented to [facility] ED 8/11 admitted via [facility] ED  Date of Positive COVID Test: 06/10/2020  Vaccination Status: Unvaccinated  COVID-19 specific Treatment: Remdesivir 8/10 > 8/14 Solu-Medrol 8/10 >  Antimicrobials:  Levaquin 8/10 > 8/14  Subjective:  Patient in bed, appears comfortable, denies any headache, no fever, no chest pain or pressure, no shortness of breath , no abdominal pain. No focal weakness.   Assessment & Plan:  COVID Pneumonia - acute on chronic hypoxic respiratory failure Continue Solu-Medrol and Remdesivir - Actemra not dosed due to concern for concomitant bacterial pneumonia with elevated procalcitonin - inflammatory markers continue to improve and now on baseline 2 L nasal cannula oxygen.  Recent Labs  Lab 06/10/20 1944 06/10/20 2131 06/12/20 0403 06/13/20 0600 06/14/20 1451 06/15/20 0707  DDIMER  --  2.78* 1.84* 1.26* 1.39* 1.23*  FERRITIN  --  208 168  --   --   --   CRP 19.0*  --  9.8* 5.0* 3.5* 1.7*  ALT  --   --  18 13 17 15   PROCALCITON  --  2.77  --  0.56 0.10  --     Bilateral lower lobe airway occlusions Noted on CTa chest - suggestive of mucous plugging or aspiration -SLP evaluation noted no suggestion of dysphagia - suspect pt  suffers w/ intermittent aspiration due to esophageal regurgitation, speech eval here, outpatient GI and pulmonary follow-up.  Esophageal diverticulum, HH and dysmotility  Noted on GI eval June 2019 - does not appear as though pt has had GI f/u since that time, as was suggested at time of d/c - this should be considered once she is recovered from [place], outpatient GI follow-up post discharge.  Significantly elevated D-dimer - CT angio chest without evidence of PE -D-dimer now trending downward - likely simply an acute phase reactant in setting of Covid -if begins to climb again will check lower extremity duplex  2.8 x 2.8cm cavitation in the left upper lobe - Noted on admit CXR - CT confirms this finding and suggested is most consistent with a cavitating pneumonia -follow-up CT imaging will be required to assure this resolves and if it does not malignancy will have to be considered -received a short course of Levaquin with stop date of 06/13/2020-suspect she may suffer intermittent episodic aspiration related to esophageal spasming and or dysmotility, speech to evaluate, have her follow with pulmonary post discharge.  Chronic hypoxic respiratory failure due to moderate to severe COPD - Requires home O2 support at 2 L/min nasal cannula -no wheezing on exam -presently requiring only slightly more than her usual baseline oxygen support.  Mild hyponatremia - Likely reflective of dehydration - corrected with gentle volume resuscitation.  Newly diagnosed uncontrolled DM with hyperglycemia - A1c 7.3 this admission -  A1c 6.05 April 2019 - initiate Glucophage - educate patient -wean steroids as rapidly as possible  HTN - Blood pressure well controlled  Normocytic anemia - No evidence of gross blood loss -anemia panel without evidence of severe iron deficiency and with adequate B12 and folate levels, follow with PCP for age-appropriate outpatient work-up.    DVT prophylaxis: Lovenox Code Status: FULL  CODE Family Communication: Daughter [name] on [phone] 06/14/2020 message left 10:40 AM, called again on 06/15/2020 at 11:18 AM and was told this is the wrong number, called home number which is [phone] at 11:19 AM message left  Status is: Inpatient  Remains inpatient appropriate because:Inpatient level of care appropriate due to severity of illness   Dispo: The patient is from: Home              Anticipated d/c is to: Home              Anticipated d/c date is: 2 days              Patient currently is  medically stable to d/c.  She tells me that now she wants to home and not SNF as her daughter has allowed her to come home and she would like to go home tomorrow.  Consultants:  none  Objective: Blood pressure 108/61, pulse 61, temperature 97.9 F (36.6 C), temperature source Oral, resp. rate 18, height 5\' 3"  (1.6 m), weight 43.1 kg, SpO2 97 %.  Intake/Output Summary (Last 24 hours) at 06/15/2020 1117 Last data filed at 06/15/2020 0920 Gross per 24 hour  Intake 1080 ml  Output --  Net 1080 ml   Filed Weights   06/12/20 1109 06/14/20 0409 06/15/20 0408  Weight: 40.5 kg 40.4 kg 43.1 kg    Examination:  Awake Alert, No new F.N deficits, Normal affect [place].AT,PERRAL Supple Neck,No JVD, No cervical lymphadenopathy appriciated.  Symmetrical Chest wall movement, Good air movement bilaterally, CTAB RRR,No Gallops, Rubs or new Murmurs, No Parasternal Heave +ve B.Sounds, Abd Soft, No tenderness, No organomegaly appriciated, No rebound - guarding or rigidity. No Cyanosis, Clubbing or edema, No new Rash or bruise   CBC: Recent Labs  Lab 06/13/20 0600 06/14/20 1451 06/15/20 0707  WBC 13.8* 13.6* 14.6*  NEUTROABS 12.6* 12.9* 12.8*  HGB 11.0* 11.0* 11.1*  HCT 36.3 36.5 36.9  MCV 98.9 98.1 97.1  PLT 375 400 010   Basic Metabolic Panel: Recent Labs  Lab 06/13/20 0600 06/14/20 1451 06/15/20 0707  NA 136 129* 135  K 5.1 5.3* 5.0  CL 85* 83* 87*  CO2 42* 35* 43*  GLUCOSE  169* 435* 44*  BUN 46* 47* 34*  CREATININE 0.96 1.05* 0.74  CALCIUM 9.1 8.9 8.9  MG  --   --  1.7   GFR: Estimated Creatinine Clearance: 46.4 mL/min (by C-G formula based on SCr of 0.74 mg/dL).  Liver Function Tests: Recent Labs  Lab 06/12/20 0403 06/13/20 0600 06/14/20 1451 06/15/20 0707  AST 22 16 25 15   ALT 18 13 17 15   ALKPHOS 52 51 55 50  BILITOT 0.6 0.3 0.6 0.6  PROT 5.6* 5.3* 5.0* 4.8*  ALBUMIN 2.5* 2.3* 2.2* 2.1*   HbA1C: Hgb A1c MFr Bld  Date/Time Value Ref Range Status  06/13/2020 06:00 AM 7.3 (H) 4.8 - 5.6 % Final    Comment:    (NOTE) Pre diabetes:          5.7%-6.4%  Diabetes:              >  6.4%  Glycemic control for   <7.0% adults with diabetes   04/19/2019 03:37 PM 6.4 4.6 - 6.5 % Final    Comment:    Glycemic Control Guidelines for People with Diabetes:Non Diabetic:  <6%Goal of Therapy: <7%Additional Action Suggested:  >8%     CBG: Recent Labs  Lab 06/14/20 1010 06/14/20 1415 06/14/20 1651 06/14/20 2102 06/15/20 0837  GLUCAP 263* 417* 171* 177* 288*    Recent Results (from the past 240 hour(s))  SARS Coronavirus 2 by RT PCR (hospital order, performed in [place] hospital lab) Nasopharyngeal Nasopharyngeal Swab     Status: Abnormal   Collection Time: 06/10/20  3:48 PM   Specimen: Nasopharyngeal Swab  Result Value Ref Range Status   SARS Coronavirus 2 POSITIVE (A) NEGATIVE Final    Comment: RESULT CALLED TO, READ BACK BY AND VERIFIED WITH: L,CHILTON @1803  06/10/20 EB (NOTE) SARS-CoV-2 target nucleic acids are DETECTED  SARS-CoV-2 RNA is generally detectable in upper respiratory specimens  during the acute phase of infection.  Positive results are indicative  of the presence of the identified virus, but do not rule out bacterial infection or co-infection with other pathogens not detected by the test.  Clinical correlation with patient history and  other diagnostic information is necessary to determine patient infection status.  The  expected result is negative.  Fact Sheet for Patients:   StrictlyIdeas.no   Fact Sheet for Healthcare Providers:   BankingDealers.co.za    This test is not yet approved or cleared by the [place] FDA and  has been authorized for detection and/or diagnosis of SARS-CoV-2 by FDA under an Emergency Use Authorization (EUA).  This EUA will remain in effect (meaning this test can b e used) for the duration of  the COVID-19 declaration under Section 564(b)(1) of the Act, 21 U.S.C. section 360-bbb-3(b)(1), unless the authorization is terminated or revoked sooner.  Performed at [facility], [place] [address], [place], [place] [address]   Blood Culture (routine x 2)     Status: None   Collection Time: 06/10/20  7:47 PM   Specimen: BLOOD  Result Value Ref Range Status   Specimen Description BLOOD LEFT ANTECUBITAL  Final   Special Requests   Final    BOTTLES DRAWN AEROBIC AND ANAEROBIC Blood Culture results may not be optimal due to an inadequate volume of blood received in culture bottles   Culture   Final    NO GROWTH 5 DAYS Performed at [facility], [place] [address] [place], [place] [address]    Report Status 06/15/2020 FINAL  Final  Blood Culture (routine x 2)     Status: None   Collection Time: 06/10/20  9:22 PM   Specimen: BLOOD RIGHT HAND  Result Value Ref Range Status   Specimen Description BLOOD RIGHT HAND  Final   Special Requests   Final    BOTTLES DRAWN AEROBIC AND ANAEROBIC Blood Culture adequate volume   Culture   Final    NO GROWTH 5 DAYS Performed at [facility], [place] [address], [place], [place] [address]    Report Status 06/15/2020 FINAL  Final     Scheduled Meds: . aspirin EC  81 mg Oral Daily  . azelastine  1 spray Each Nare BID  . diltiazem  240 mg Oral Daily  . enoxaparin (LOVENOX) injection  30 mg Subcutaneous Daily  . feeding supplement (ENSURE ENLIVE)  237 mL Oral TID BM  . fluticasone  2  spray  Each Nare Daily  . fluticasone furoate-vilanterol  1 puff Inhalation Daily  . insulin aspart  0-5 Units Subcutaneous QHS  . insulin aspart  0-9 Units Subcutaneous TID WC  . insulin glargine  8 Units Subcutaneous QHS  . lidocaine  1 patch Transdermal Q24H  . loratadine  10 mg Oral Daily  . melatonin  3 mg Oral QHS  . metFORMIN  500 mg Oral BID WC  . methylPREDNISolone (SOLU-MEDROL) injection  10 mg Intravenous Daily  . metoprolol tartrate  12.5 mg Oral BID  . montelukast  10 mg Oral QHS  . pantoprazole  40 mg Oral Daily  . umeclidinium bromide  1 puff Inhalation Daily   Continuous Infusions: . lactated ringers 100 mL/hr at 06/15/20 0858     LOS: 5 days   Signature  Lala Lund M.D on 06/15/2020 at 11:17 AM   -  To page go to [email or web address]

## 2020-06-16 LAB — GLUCOSE, CAPILLARY
Glucose-Capillary: 48 mg/dL — ABNORMAL LOW (ref 70–99)
Glucose-Capillary: 92 mg/dL (ref 70–99)
Glucose-Capillary: 42 mg/dL — CL (ref 70–99)
Glucose-Capillary: 49 mg/dL — ABNORMAL LOW (ref 70–99)
Glucose-Capillary: 72 mg/dL (ref 70–99)
Glucose-Capillary: 116 mg/dL — ABNORMAL HIGH (ref 70–99)
Glucose-Capillary: 180 mg/dL — ABNORMAL HIGH (ref 70–99)

## 2020-06-16 LAB — BASIC METABOLIC PANEL
Anion gap: 6 (ref 5–15)
BUN: 34 mg/dL — ABNORMAL HIGH (ref 8–23)
CO2: 44 mmol/L — ABNORMAL HIGH (ref 22–32)
Calcium: 8.9 mg/dL (ref 8.9–10.3)
Chloride: 87 mmol/L — ABNORMAL LOW (ref 98–111)
Creatinine, Ser: 0.75 mg/dL (ref 0.44–1.00)
GFR calc Af Amer: >60 mL/min (ref >60)
GFR calc non Af Amer: >60 mL/min (ref >60)
Glucose, Bld: 120 mg/dL — ABNORMAL HIGH (ref 70–99)
Potassium: 4.4 mmol/L (ref 3.5–5.1)
Sodium: 137 mmol/L (ref 135–145)

## 2020-06-16 LAB — GLUCOSE, RANDOM: Glucose, Bld: 93 mg/dL (ref 70–99)

## 2020-06-16 MED ORDER — INSULIN GLARGINE 100 UNIT/ML ~~LOC~~ SOLN
4.0000 [IU] | Freq: Every day | SUBCUTANEOUS | Status: DC
  Filled 2020-06-16: qty 0.04

## 2020-06-16 NOTE — Progress Notes (Signed)
Occupational Therapy Treatment Patient Details Name: Megan [name] MRN: [id number] DOB: 21-Jan-1951 Today's Date: 06/16/2020    History of present illness Pt is a 69 y.o. female with PMH of COPD (2L home O2), GERD, HTN, who presented 06/10/20 with 3-4 days of worsening SOB with a productive cough and left-sided pleuritic chest pain. Workup for COVID-19. Pt not vaccinated.   OT comments  Pt continues to present with decreased activity tolerance. Providing education and handout on [facility] for ADLs and IADLs. Pt verbalized understanding. Pt declining to perform functional mobility or ADLs at this time. Pt requesting to return to bed. Agreeable to lay sidelying; declined prone position. Reports pain at bottom; nursing notified. Continue to recommend dc to post-acute rehab. Will continue to follow acutely as admitted.    Follow Up Recommendations  SNF;Supervision/Assistance - 24 hour    Equipment Recommendations  3 in 1 bedside commode    Recommendations for Other Services      Precautions / Restrictions Precautions Precautions: Fall;Other (comment) Precaution Comments: Wears 2L O2 baseline       Mobility Bed Mobility Overal bed mobility: Needs Assistance Bed Mobility: Sit to Supine       Sit to supine: Supervision;HOB elevated      Transfers Overall transfer level: Needs assistance Equipment used: None Transfers: Sit to/from Stand Sit to Stand: Min guard         General transfer comment: Min Guard A for safety    Balance Overall balance assessment: Needs assistance Sitting-balance support: No upper extremity supported;Feet supported Sitting balance-Leahy Scale: Good     Standing balance support: Single extremity supported;During functional activity;No upper extremity supported Standing balance-Leahy Scale: Fair                             ADL either performed or assessed with clinical judgement   ADL Overall ADL's : Needs assistance/impaired                          Toilet Transfer: Min guard;Stand-pivot (simulated in room) Toilet Transfer Details (indicate cue type and reason): Min Guard A for safety         Functional mobility during ADLs: Min guard General ADL Comments: Providing pt with handout and education on [facility] for ADLs and IADLs. Pt verbalized understanding stating ways she already uses techniques as well as ways she plans to implement into her daily routine. Pt requesting to return to bed. Requiring Min guard A for safety during mobility. Declined further ADLs stating "I just can't do that right now"     Vision       Perception     Praxis      Cognition Arousal/Alertness: Awake/alert Behavior During Therapy: WFL for tasks assessed/performed Overall Cognitive Status: No family/caregiver present to determine baseline cognitive functioning Area of Impairment: Attention;Awareness;Problem solving                   Current Attention Level: Selective     Safety/Judgement: Decreased awareness of safety Awareness: Emergent Problem Solving: Slow processing;Requires verbal cues General Comments: Upon arrival, pt seeming tearful/near tears. Throughout session, pt flucuating between tearful and smiling. Pt also requiring increased cues thorughout and time for processing        Exercises     Shoulder Instructions       General Comments      Pertinent Vitals/ Pain       Pain  Assessment: Faces Faces Pain Scale: Hurts little more Pain Location: Bottom Pain Descriptors / Indicators: Discomfort Pain Intervention(s): Monitored during session;Other (comment) (Notified nursing)  Home Living                                          Prior Functioning/Environment              Frequency  Min 2X/week        Progress Toward Goals  OT Goals(current goals can now be found in the care plan section)  Progress towards OT goals: Progressing toward goals  Acute Rehab OT Goals Patient  Stated Goal: get some rehab before she goes home OT Goal Formulation: With patient Time For Goal Achievement: 06/27/20 Potential to Achieve Goals: Good ADL Goals Pt Will Perform Lower Body Bathing: with modified independence;sit to/from stand Pt Will Perform Lower Body Dressing: with modified independence;sit to/from stand Pt Will Transfer to Toilet: with modified independence;ambulating Additional ADL Goal #1: Pt will independnetly verbalize 3 energy conservation strategies Additional ADL Goal #2: PT will verbalize 3 strategies to reduce risk of falls  Plan Discharge plan remains appropriate    Co-evaluation                 AM-PAC OT "6 Clicks" Daily Activity     Outcome Measure   Help from another person eating meals?: None Help from another person taking care of personal grooming?: A Little Help from another person toileting, which includes using toliet, bedpan, or urinal?: A Little Help from another person bathing (including washing, rinsing, drying)?: A Little Help from another person to put on and taking off regular upper body clothing?: A Little Help from another person to put on and taking off regular lower body clothing?: A Little 6 Click Score: 19    End of Session Equipment Utilized During Treatment: Oxygen (2L)  OT Visit Diagnosis: Unsteadiness on feet (R26.81);Muscle weakness (generalized) (M62.81);Other symptoms and signs involving cognitive function;Pain Pain - Right/Left: Right Pain - part of body:  (flank)   Activity Tolerance Patient tolerated treatment well   Patient Left in bed;with call bell/phone within reach;with bed alarm set   Nurse Communication Mobility status        Time: 2620-3559 OT Time Calculation (min): 25 min  Charges: OT General Charges $OT Visit: 1 Visit OT Treatments $Self Care/Home Management : 23-37 mins  [place], OTR/L Acute Rehab Pager: [phone] Office: [place] 06/16/2020,  5:19 PM

## 2020-06-16 NOTE — Plan of Care (Signed)
[place]

## 2020-06-16 NOTE — Care Management Important Message (Signed)
Important Message  Patient Details  Name: Megan [name] MRN: [id number] Date of Birth: Mar 20, 1951   Medicare Important Message Given:  Yes - Important Message mailed due to current National Emergency  Verbal consent obtained due to current National Emergency  Relationship to patient: Self Contact Name: Rickeya Manus Call Date: 06/16/20  Time: 1207 Phone: [phone] Outcome: Spoke with contact Important Message mailed to: Patient address on file    Delorse Lek 06/16/2020, 12:08 PM

## 2020-06-16 NOTE — TOC Initial Note (Signed)
Transition of Care [facility]) - Initial/Assessment Note    Patient Details  Name: Megan [name] MRN: [id number] Date of Birth: 1951-04-19  Transition of Care [facility]) CM/SW Contact:    Benard Halsted, LCSW Phone Number: 06/16/2020, 8:41 AM  Clinical Narrative:                 CSW received consult for possible SNF placement at time of discharge. CSW spoke with patient regarding PT recommendation of SNF at time of discharge. Patient reported that she does not want to go to SNF and would like home health services instead. She reported that she feels safe going home with family but is requesting to stay another day to make certain she is well as her daughter just had a baby. CSW provided Medicare [place] ratings list. CSW discussed equipment needs with patient and she requested a rolling walker. CSW will out to [employer] for delivery to the room. Patient declined a 3in1 bedside commode. She reports she has oxygen at home through [employer]. CSW confirmed PCP and address with patient. No further questions reported at this time.    Expected Discharge Plan: [place] Barriers to Discharge: Family Issues (Patient requesting to stay another day due to her daughter having a baby)   Patient Goals and CMS Choice Patient states their goals for this hospitalization and ongoing recovery are:: Return home CMS Medicare.gov Compare Post Acute Care list provided to:: Patient Choice offered to / list presented to : Patient  Expected Discharge Plan and Services Expected Discharge Plan: [place] In-house Referral: Clinical Social Work Discharge Planning Services: CM Consult Post Acute Care Choice: Home Health, Durable Medical Equipment Living arrangements for the past 2 months: Single Family Home Expected Discharge Date: 06/16/20               DME Arranged: 3-N-1 DME Agency: [employer]                  Prior Living Arrangements/Services Living arrangements for the past 2 months:  Single Family Home Lives with:: Adult Children Patient language and need for interpreter reviewed:: Yes Do you feel safe going back to the place where you live?: Yes      Need for Family Participation in Patient Care: No (Comment) Care giver support system in place?: Yes (comment) Current home services: DME (O2 through [employer]) Criminal Activity/Legal Involvement Pertinent to Current Situation/Hospitalization: No - Comment as needed  Activities of Daily Living Home Assistive Devices/Equipment: CBG Meter ADL Screening (condition at time of admission) Patient's cognitive ability adequate to safely complete daily activities?: Yes Is the patient deaf or have difficulty hearing?: Yes Does the patient have difficulty seeing, even when wearing glasses/contacts?: No Does the patient have difficulty concentrating, remembering, or making decisions?: No Patient able to express need for assistance with ADLs?: No Does the patient have difficulty dressing or bathing?: No Independently performs ADLs?: Yes (appropriate for developmental age) Does the patient have difficulty walking or climbing stairs?: No Weakness of Legs: Both Weakness of Arms/Hands: None  Permission Sought/Granted Permission sought to share information with : Facility Art therapist granted to share information with : Yes, Verbal Permission Granted     Permission granted to share info w AGENCY: Home Health        Emotional Assessment   Attitude/Demeanor/Rapport: Engaged, Gracious Affect (typically observed): Accepting, Appropriate Orientation: : Oriented to Place, Oriented to Self, Oriented to  Time, Oriented to Situation Alcohol / Substance Use: Not  Applicable Psych Involvement: No (comment)  Admission diagnosis:  Acute respiratory failure due to COVID-19 (HCC) [U07.1, J96.00] Pneumonia due to COVID-19 virus [U07.1, J12.82] Acute respiratory disease due to COVID-19 virus [U07.1, J06.9] Patient Active  Problem List   Diagnosis Date Noted  . Acute respiratory disease due to COVID-19 virus 06/11/2020  . Acute respiratory failure due to COVID-19 ([place]) 06/10/2020  . Nail abnormality 08/07/2019  . COPD exacerbation ([place]) 04/19/2019  . Anemia 04/19/2019  . Urinary frequency 09/22/2018  . Chills without fever 09/22/2018  . Thoracic back pain 07/25/2018  . Low back pain 07/25/2018  . Coccyx pain 07/25/2018  . Fever 05/03/2018  . Food impaction of esophagus   . Abnormal esophagram   . Abdominal pain 04/16/2018  . Epigastric pain 04/16/2018  . Chest pain 04/16/2018  . Chronic respiratory failure with hypoxia ([place]) 04/16/2018  . Dysphagia 04/16/2018  . Odynophagia 04/16/2018  . Allergic rhinitis 03/31/2018  . Malnutrition of moderate degree 02/27/2018  . 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 Palliative care by specialist   . Acute on Chronic respiratory failure with hypoxia and SEVERE Hypercapnia ([place]) 02/13/2018  . Acute pain of right shoulder 01/07/2018  . Flu-like symptoms 12/09/2017  . Fibromyalgia 09/16/2017  . Fatigue 09/16/2017  . Chronic respiratory failure with hypoxia and hypercapnia (HCC)   . Sepsis ([place])   . Hyperglycemia 01/05/2017  . Hair loss 06/22/2016  . Rash 06/22/2016  . Mixed headache 03/19/2016  . COPD (chronic obstructive pulmonary disease) ([place]) 03/19/2016  . Esophageal reflux 09/01/2015  . Goals of care, counseling/discussion   . Palliative care encounter   . Anxiety 08/20/2015  . Chronic post-traumatic stress disorder (PTSD) 08/20/2015  . Multiple pulmonary nodules determined by computed tomography of lung 08/19/2015  . Essential hypertension 08/19/2015  . Protein-calorie malnutrition, severe 08/19/2015   PCP:  [name], MD Pharmacy:   [facility] [place], [place] [address] Phone: [phone]  Fax: [phone]     Social Determinants of Health (SDOH) Interventions    Readmission Risk Interventions No flowsheet data found.

## 2020-06-16 NOTE — Consult Note (Signed)
   [facility] Inpatient Consult   06/16/2020  Megan [name] Jan 18, 1951 [id number]   [place] Organization [ACO] Patient:  Medicare Next Gen    Patient screened for COVID-19 transition from hospital noted and for hospitalizations to check if potential [place] Management service needs.  Review of patient's medical record reveals patient is currently declining a skilled nursing level of care as recommended by PT/OT.  Primary Care Provider is Cathlean Cower, MD, [facility] this provider is isted to provide the transition of care [TOC] for post hospital follow up.   Plan:  Will follow up with inpatient [facility] team members to see if patient could benefit from post hospital program hospital to home.  Messaged TOC regarding program eligibility.  Continue to follow progress and disposition to assess for post hospital care management needs.    Please place a [facility] Care Management consult as appropriate and for questions contact:   Natividad Brood, RN BSN [facility] Liaison  [phone] business mobile phone Toll free office [phone]  Fax number: [phone] [email or web address]

## 2020-06-16 NOTE — Plan of Care (Signed)

## 2020-06-16 NOTE — Progress Notes (Signed)
Megan [name]  NID:[id number] DOB: February 23, 1951 DOA: 06/10/2020 PCP: [name], MD    Brief Narrative:  69 year old with a history of COPD on home oxygen at 2lpm and HTN who presented to the ED with 3-4 days of worsening shortness of breath with a productive cough and left-sided pleuritic chest pain.  She has not been vaccinated against COVID-19, and has had other family members in her home recently who were sick.  At presentation the patient was requiring a nonrebreather but was able to be de-escalated to 4 L nasal cannula support.  CXR confirmed bilateral pleural effusions with infiltrates diffusely.  The patient's Covid test was positive.  She was placed on IV Solu-Medrol and remdesivir.  Significant Events: 8/10 presented to [facility] ED 8/11 admitted via [facility] ED  Date of Positive COVID Test: 06/10/2020  Vaccination Status: Unvaccinated  COVID-19 specific Treatment: Remdesivir 8/10 > 8/14 Solu-Medrol 8/10 >  Antimicrobials:  Levaquin 8/10 > 8/14  Subjective:  Patient in bed, appears comfortable, denies any headache, no fever, no chest pain or pressure, no shortness of breath , no abdominal pain. No focal weakness.   Assessment & Plan:  COVID Pneumonia - acute on chronic hypoxic respiratory failure Continue Solu-Medrol and Remdesivir - Actemra not dosed due to concern for concomitant bacterial pneumonia with elevated procalcitonin - inflammatory markers continue to improve and now on baseline 2 L nasal cannula oxygen.  Recent Labs  Lab 06/10/20 1944 06/10/20 2131 06/12/20 0403 06/13/20 0600 06/14/20 1451 06/15/20 0707  DDIMER  --  2.78* 1.84* 1.26* 1.39* 1.23*  FERRITIN  --  208 168  --   --   --   CRP 19.0*  --  9.8* 5.0* 3.5* 1.7*  ALT  --   --  18 13 17 15   PROCALCITON  --  2.77  --  0.56 0.10  --     Bilateral lower lobe airway occlusions Noted on CTa chest - suggestive of mucous plugging or aspiration -SLP evaluation noted no suggestion of dysphagia - suspect pt  suffers w/ intermittent aspiration due to esophageal regurgitation, speech eval here, outpatient GI and pulmonary follow-up.  Esophageal diverticulum, HH and dysmotility  Noted on GI eval June 2019 - does not appear as though pt has had GI f/u since that time, as was suggested at time of d/c - this should be considered once she is recovered from [place], outpatient GI follow-up post discharge.  Significantly elevated D-dimer - CT angio chest without evidence of PE -D-dimer now trending downward - likely simply an acute phase reactant in setting of Covid -if begins to climb again will check lower extremity duplex  2.8 x 2.8cm cavitation in the left upper lobe - Noted on admit CXR - CT confirms this finding and suggested is most consistent with a cavitating pneumonia -follow-up CT imaging will be required to assure this resolves and if it does not malignancy will have to be considered -received a short course of Levaquin with stop date of 06/13/2020-suspect she may suffer intermittent episodic aspiration related to esophageal spasming and or dysmotility, speech to evaluate, have her follow with pulmonary post discharge.  Chronic hypoxic respiratory failure due to moderate to severe COPD - Requires home O2 support at 2 L/min nasal cannula -no wheezing on exam -presently requiring only slightly more than her usual baseline oxygen support.  Mild hyponatremia - Likely reflective of dehydration - corrected with gentle volume resuscitation.  Newly diagnosed uncontrolled DM with hyperglycemia - A1c 7.3 this admission -  A1c 6.05 April 2019 - initiate Glucophage - educate patient -wean steroids as rapidly as possible  HTN - Blood pressure well controlled  Normocytic anemia - No evidence of gross blood loss -anemia panel without evidence of severe iron deficiency and with adequate B12 and folate levels, follow with PCP for age-appropriate outpatient work-up.    DVT prophylaxis: Lovenox Code Status: FULL  CODE Family Communication: Daughter [name] on [phone] 06/14/2020 message left 10:40 AM, called again on 06/15/2020 at 11:18 AM and was told this is the wrong number, called home number which is [phone] at 11:19 AM message left  Status is: Inpatient  Remains inpatient appropriate because:Inpatient level of care appropriate due to severity of illness   Dispo: The patient is from: Home              Anticipated d/c is to: Home              Anticipated d/c date is: 2 days              Patient currently is  medically stable to d/c.  She had decided yesterday that she will go today on 06/16/2020, today she tells me that she wants 1 more day as her daughter just delivered a baby.  She has been informed that she is medically stable and ready for discharge and at this point there extremely sick Covid patients in the ER who need to be moved up for proper treatment as soon as possible.  Consultants:  none  Objective: Blood pressure 122/66, pulse 73, temperature 97.8 F (36.6 C), temperature source Oral, resp. rate 18, height 5\' 3"  (1.6 m), weight 43.1 kg, SpO2 98 %.  Intake/Output Summary (Last 24 hours) at 06/16/2020 1058 Last data filed at 06/15/2020 2147 Gross per 24 hour  Intake 240 ml  Output --  Net 240 ml   Filed Weights   06/12/20 1109 06/14/20 0409 06/15/20 0408  Weight: 40.5 kg 40.4 kg 43.1 kg    Examination:  Awake Alert, No new F.N deficits, Normal affect [place].AT,PERRAL Supple Neck,No JVD, No cervical lymphadenopathy appriciated.  Symmetrical Chest wall movement, Good air movement bilaterally, CTAB RRR,No Gallops, Rubs or new Murmurs, No Parasternal Heave +ve B.Sounds, Abd Soft, No tenderness, No organomegaly appriciated, No rebound - guarding or rigidity. No Cyanosis, Clubbing or edema, No new Rash or bruise    CBC: Recent Labs  Lab 06/13/20 0600 06/14/20 1451 06/15/20 0707  WBC 13.8* 13.6* 14.6*  NEUTROABS 12.6* 12.9* 12.8*  HGB 11.0* 11.0* 11.1*  HCT 36.3  36.5 36.9  MCV 98.9 98.1 97.1  PLT 375 400 938   Basic Metabolic Panel: Recent Labs  Lab 06/14/20 1451 06/15/20 0707 06/16/20 0338  NA 129* 135 137  K 5.3* 5.0 4.4  CL 83* 87* 87*  CO2 35* 43* 44*  GLUCOSE 435* 44* 120*  BUN 47* 34* 34*  CREATININE 1.05* 0.74 0.75  CALCIUM 8.9 8.9 8.9  MG  --  1.7  --    GFR: Estimated Creatinine Clearance: 46.4 mL/min (by C-G formula based on SCr of 0.75 mg/dL).  Liver Function Tests: Recent Labs  Lab 06/12/20 0403 06/13/20 0600 06/14/20 1451 06/15/20 0707  AST 22 16 25 15   ALT 18 13 17 15   ALKPHOS 52 51 55 50  BILITOT 0.6 0.3 0.6 0.6  PROT 5.6* 5.3* 5.0* 4.8*  ALBUMIN 2.5* 2.3* 2.2* 2.1*   HbA1C: Hgb A1c MFr Bld  Date/Time Value Ref Range Status  06/13/2020 06:00 AM 7.3 (H)  4.8 - 5.6 % Final    Comment:    (NOTE) Pre diabetes:          5.7%-6.4%  Diabetes:              >6.4%  Glycemic control for   <7.0% adults with diabetes   04/19/2019 03:37 PM 6.4 4.6 - 6.5 % Final    Comment:    Glycemic Control Guidelines for People with Diabetes:Non Diabetic:  <6%Goal of Therapy: <7%Additional Action Suggested:  >8%     CBG: Recent Labs  Lab 06/15/20 1144 06/15/20 1648 06/15/20 2207 06/16/20 0805 06/16/20 0833  GLUCAP 239* 173* 144* 48* 92    Recent Results (from the past 240 hour(s))  SARS Coronavirus 2 by RT PCR (hospital order, performed in [facility] hospital lab) Nasopharyngeal Nasopharyngeal Swab     Status: Abnormal   Collection Time: 06/10/20  3:48 PM   Specimen: Nasopharyngeal Swab  Result Value Ref Range Status   SARS Coronavirus 2 POSITIVE (A) NEGATIVE Final    Comment: RESULT CALLED TO, READ BACK BY AND VERIFIED WITH: L,CHILTON @1803  06/10/20 EB (NOTE) SARS-CoV-2 target nucleic acids are DETECTED  SARS-CoV-2 RNA is generally detectable in upper respiratory specimens  during the acute phase of infection.  Positive results are indicative  of the presence of the identified virus, but do not rule  out bacterial infection or co-infection with other pathogens not detected by the test.  Clinical correlation with patient history and  other diagnostic information is necessary to determine patient infection status.  The expected result is negative.  Fact Sheet for Patients:   StrictlyIdeas.no   Fact Sheet for Healthcare Providers:   BankingDealers.co.za    This test is not yet approved or cleared by the [place] FDA and  has been authorized for detection and/or diagnosis of SARS-CoV-2 by FDA under an Emergency Use Authorization (EUA).  This EUA will remain in effect (meaning this test can b e used) for the duration of  the COVID-19 declaration under Section 564(b)(1) of the Act, 21 U.S.C. section 360-bbb-3(b)(1), unless the authorization is terminated or revoked sooner.  Performed at [facility], [place] [address], [place], [place] [address]   Blood Culture (routine x 2)     Status: None   Collection Time: 06/10/20  7:47 PM   Specimen: BLOOD  Result Value Ref Range Status   Specimen Description BLOOD LEFT ANTECUBITAL  Final   Special Requests   Final    BOTTLES DRAWN AEROBIC AND ANAEROBIC Blood Culture results may not be optimal due to an inadequate volume of blood received in culture bottles   Culture   Final    NO GROWTH 5 DAYS Performed at [facility], [place] [address] [place], [place] [address]    Report Status 06/15/2020 FINAL  Final  Blood Culture (routine x 2)     Status: None   Collection Time: 06/10/20  9:22 PM   Specimen: BLOOD RIGHT HAND  Result Value Ref Range Status   Specimen Description BLOOD RIGHT HAND  Final   Special Requests   Final    BOTTLES DRAWN AEROBIC AND ANAEROBIC Blood Culture adequate volume   Culture   Final    NO GROWTH 5 DAYS Performed at [facility], [place] [address], [place], [place] [address]    Report Status 06/15/2020 FINAL  Final     Scheduled Meds:  aspirin EC   81 mg Oral Daily   azelastine  1  spray Each Nare BID   diltiazem  240 mg Oral Daily   enoxaparin (LOVENOX) injection  30 mg Subcutaneous Daily   feeding supplement (ENSURE ENLIVE)  237 mL Oral TID BM   fluticasone  2 spray Each Nare Daily   fluticasone furoate-vilanterol  1 puff Inhalation Daily   insulin aspart  0-5 Units Subcutaneous QHS   insulin aspart  0-9 Units Subcutaneous TID WC   lidocaine  1 patch Transdermal Q24H   loratadine  10 mg Oral Daily   melatonin  3 mg Oral QHS   metFORMIN  500 mg Oral BID WC   metoprolol tartrate  12.5 mg Oral BID   montelukast  10 mg Oral QHS   pantoprazole  40 mg Oral Daily   umeclidinium bromide  1 puff Inhalation Daily   Continuous Infusions:    LOS: 6 days   Signature  Lala Lund M.D on 06/16/2020 at 10:58 AM   -  To page go to [email or web address]

## 2020-06-16 NOTE — TOC Transition Note (Signed)
Transition of Care [facility]) - CM/SW Discharge Note   Patient Details  Name: Megan [name] MRN: [id number] Date of Birth: [birth year]  [facility] CM/SW Contact:  Verdell Carmine, RN Phone Number: 06/16/2020, 12:25 PM   Clinical Narrative:    [facility] will [facility] with [facility]  Will be discharged to [facility] has home o2 set up already at 2L.    Final next level of care: Home w Home Health Services Barriers to Discharge: Family Issues (Patient requesting to stay another day due to her daughter having a baby)   Patient Goals and CMS Choice Patient states their goals for this hospitalization and ongoing recovery are:: Return home CMS Medicare.gov Compare Post Acute Care list provided to:: Patient Choice offered to / list presented to : Patient  Discharge Placement                       Discharge Plan and Services In-house Referral: Clinical Social Work Discharge Planning Services: CM Consult Post Acute Care Choice: Home Health, Durable Medical Equipment          DME Arranged: 3-N-1 DME Agency: [facility]       HH Arranged: PT, Nurse's Aide, RN [place] Agency: [facility]) Date HH Agency Contacted: 06/16/20 Time [place]: 1200 Representative spoke with at [place]: Janae Sauce  Social Determinants of Health ([place]) Interventions     Readmission Risk Interventions No flowsheet data found.

## 2020-06-16 NOTE — Progress Notes (Signed)
Inpatient Diabetes Program Recommendations  AACE/ADA: New Consensus Statement on Inpatient Glycemic Control (2015)  Target Ranges:  Prepandial:   less than 140 mg/dL      Peak postprandial:   less than 180 mg/dL (1-2 hours)      Critically ill patients:  140 - 180 mg/dL   Lab Results  Component Value Date   GLUCAP 72 06/16/2020   HGBA1C 7.3 (H) 06/13/2020    Review of Glycemic Control  Diabetes history: new diagnosis  Current orders for Inpatient glycemic control:  Metformin 500 mg bid Novolog 0-9 units tid + hs  Inpatient Diabetes Program Recommendations:    Blood glucose meter kit order # [id number]  Spoke with pt over the phone regarding A1c level 7.3% on 8/13. Pt unaware of DM diagnosis. Described goal glucose and A1c levels. Described checking glucose at home and taking metformin with food. Discussed stress response on glucose levels and steroids effects.  Pt reports having COPD and on prednisone chronically at home. Encouraged close PCP follow up.  Thanks,  Tama Headings RN, MSN, BC-ADM Inpatient Diabetes Coordinator Team Pager [phone] (8a-5p)

## 2020-06-17 ENCOUNTER — Inpatient Hospital Stay (HOSPITAL_COMMUNITY): Payer: Medicare Other

## 2020-06-17 DIAGNOSIS — L8990 Pressure ulcer of unspecified site, unspecified stage: Secondary | ICD-10-CM | POA: Insufficient documentation

## 2020-06-17 LAB — GLUCOSE, CAPILLARY
Glucose-Capillary: 68 mg/dL — ABNORMAL LOW (ref 70–99)
Glucose-Capillary: 90 mg/dL (ref 70–99)
Glucose-Capillary: 81 mg/dL (ref 70–99)
Glucose-Capillary: 82 mg/dL (ref 70–99)
Glucose-Capillary: 113 mg/dL — ABNORMAL HIGH (ref 70–99)

## 2020-06-17 MED ORDER — BISACODYL 10 MG RE SUPP
10.0000 mg | Freq: Every day | RECTAL | Status: DC
  Administered 2020-06-17 – 2020-06-18 (×2): 10 mg via RECTAL
  Filled 2020-06-17 (×2): qty 1

## 2020-06-17 MED ORDER — METOPROLOL TARTRATE 12.5 MG HALF TABLET
12.5000 mg | ORAL_TABLET | Freq: Two times a day (BID) | ORAL | Status: DC
  Administered 2020-06-18: 12.5 mg via ORAL
  Filled 2020-06-17: qty 1

## 2020-06-17 MED ORDER — SENNOSIDES-DOCUSATE SODIUM 8.6-50 MG PO TABS: 1.0000 | ORAL_TABLET | Freq: Every evening | ORAL | 0 refills | Status: DC | PRN

## 2020-06-17 MED ORDER — DILTIAZEM HCL ER COATED BEADS 120 MG PO CP24: 120.0000 mg | ORAL_CAPSULE | Freq: Every day | ORAL | Status: DC

## 2020-06-17 MED ORDER — DILTIAZEM HCL ER COATED BEADS 120 MG PO CP24: 120.0000 mg | ORAL_CAPSULE | Freq: Every day | ORAL | 0 refills | Status: DC

## 2020-06-17 MED ORDER — POLYETHYLENE GLYCOL 3350 17 G PO PACK
17.0000 g | PACK | Freq: Two times a day (BID) | ORAL | Status: DC
  Administered 2020-06-17 – 2020-06-18 (×3): 17 g via ORAL
  Filled 2020-06-17 (×3): qty 1

## 2020-06-17 MED ORDER — ONDANSETRON HCL 4 MG PO TABS: 4.0000 mg | ORAL_TABLET | Freq: Three times a day (TID) | ORAL | 0 refills | Status: DC | PRN

## 2020-06-17 NOTE — Discharge Instructions (Signed)
Follow with Primary MD [name], MD in 7 days   Get CBC, CMP, 2 view Chest X ray -  checked next visit within 1 week by Primary MD   Activity: As tolerated with Full fall precautions use walker/cane & assistance as needed  Disposition Home    Diet: Heart Healthy    Special Instructions: If you have smoked or chewed Tobacco  in the last 2 yrs please stop smoking, stop any regular Alcohol  and or any Recreational drug use.  On your next visit with your primary care physician please Get Medicines reviewed and adjusted.  Please request your Prim.MD to go over all Hospital Tests and Procedure/Radiological results at the follow up, please get all Hospital records sent to your Prim MD by signing hospital release before you go home.  If you experience worsening of your admission symptoms, develop shortness of breath, life threatening emergency, suicidal or homicidal thoughts you must seek medical attention immediately by calling 911 or calling your MD immediately  if symptoms less severe.  You Must read complete instructions/literature along with all the possible adverse reactions/side effects for all the Medicines you take and that have been prescribed to you. Take any new Medicines after you have completely understood and accpet all the possible adverse reactions/side effects.       Person Under Monitoring Name: Megan [name]  Location: [place] [address]   Infection Prevention Recommendations for Individuals Confirmed to have, or Being Evaluated for, 2019 Novel Coronavirus (COVID-19) Infection Who Receive Care at Home  Individuals who are confirmed to have, or are being evaluated for, COVID-19 should follow the prevention steps below until a healthcare provider or local or state health department says they can return to normal activities.  Stay home except to get medical care You should restrict activities outside your home, except for getting medical care. Do not  go to work, school, or public areas, and do not use public transportation or taxis.  Call ahead before visiting your doctor Before your medical appointment, call the healthcare provider and tell them that you have, or are being evaluated for, COVID-19 infection. This will help the healthcare provider's office take steps to keep other people from getting infected. Ask your healthcare provider to call the local or state health department.  Monitor your symptoms Seek prompt medical attention if your illness is worsening (e.g., difficulty breathing). Before going to your medical appointment, call the healthcare provider and tell them that you have, or are being evaluated for, COVID-19 infection. Ask your healthcare provider to call the local or state health department.  Wear a facemask You should wear a facemask that covers your nose and mouth when you are in the same room with other people and when you visit a healthcare provider. People who live with or visit you should also wear a facemask while they are in the same room with you.  Separate yourself from other people in your home As much as possible, you should stay in a different room from other people in your home. Also, you should use a separate bathroom, if available.  Avoid sharing household items You should not share dishes, drinking glasses, cups, eating utensils, towels, bedding, or other items with other people in your home. After using these items, you should wash them thoroughly with soap and water.  Cover your coughs and sneezes Cover your mouth and nose with a tissue when you cough or sneeze, or you can cough or sneeze  into your sleeve. Throw used tissues in a lined trash can, and immediately wash your hands with soap and water for at least 20 seconds or use an alcohol-based hand rub.  Wash your [facility] your hands often and thoroughly with soap and water for at least 20 seconds. You can use an alcohol-based  hand sanitizer if soap and water are not available and if your hands are not visibly dirty. Avoid touching your eyes, nose, and mouth with unwashed hands.   Prevention Steps for Caregivers and Household Members of Individuals Confirmed to have, or Being Evaluated for, COVID-19 Infection Being Cared for in the Home  If you live with, or provide care at home for, a person confirmed to have, or being evaluated for, COVID-19 infection please follow these guidelines to prevent infection:  Follow healthcare provider's instructions Make sure that you understand and can help the patient follow any healthcare provider instructions for all care.  Provide for the patient's basic needs You should help the patient with basic needs in the home and provide support for getting groceries, prescriptions, and other personal needs.  Monitor the patient's symptoms If they are getting sicker, call his or her medical provider and tell them that the patient has, or is being evaluated for, COVID-19 infection. This will help the healthcare provider's office take steps to keep other people from getting infected. Ask the healthcare provider to call the local or state health department.  Limit the number of people who have contact with the patient  If possible, have only one caregiver for the patient.  Other household members should stay in another home or place of residence. If this is not possible, they should stay  in another room, or be separated from the patient as much as possible. Use a separate bathroom, if available.  Restrict visitors who do not have an essential need to be in the home.  Keep older adults, very young children, and other sick people away from the patient Keep older adults, very young children, and those who have compromised immune systems or chronic health conditions away from the patient. This includes people with chronic heart, lung, or kidney conditions, diabetes, and  cancer.  Ensure good ventilation Make sure that shared spaces in the home have good air flow, such as from an air conditioner or an opened window, weather permitting.  Wash your hands often  Wash your hands often and thoroughly with soap and water for at least 20 seconds. You can use an alcohol based hand sanitizer if soap and water are not available and if your hands are not visibly dirty.  Avoid touching your eyes, nose, and mouth with unwashed hands.  Use disposable paper towels to dry your hands. If not available, use dedicated cloth towels and replace them when they become wet.  Wear a facemask and gloves  Wear a disposable facemask at all times in the room and gloves when you touch or have contact with the patient's blood, body fluids, and/or secretions or excretions, such as sweat, saliva, sputum, nasal mucus, vomit, urine, or feces.  Ensure the mask fits over your nose and mouth tightly, and do not touch it during use.  Throw out disposable facemasks and gloves after using them. Do not reuse.  Wash your hands immediately after removing your facemask and gloves.  If your personal clothing becomes contaminated, carefully remove clothing and launder. Wash your hands after handling contaminated clothing.  Place all used disposable facemasks, gloves, and other waste in  a lined container before disposing them with other household waste.  Remove gloves and wash your hands immediately after handling these items.  Do not share dishes, glasses, or other household items with the patient  Avoid sharing household items. You should not share dishes, drinking glasses, cups, eating utensils, towels, bedding, or other items with a patient who is confirmed to have, or being evaluated for, COVID-19 infection.  After the person uses these items, you should wash them thoroughly with soap and water.  Wash laundry thoroughly  Immediately remove and wash clothes or bedding that have blood, body  fluids, and/or secretions or excretions, such as sweat, saliva, sputum, nasal mucus, vomit, urine, or feces, on them.  Wear gloves when handling laundry from the patient.  Read and follow directions on labels of laundry or clothing items and detergent. In general, wash and dry with the warmest temperatures recommended on the label.  Clean all areas the individual has used often  Clean all touchable surfaces, such as counters, tabletops, doorknobs, bathroom fixtures, toilets, phones, keyboards, tablets, and bedside tables, every day. Also, clean any surfaces that may have blood, body fluids, and/or secretions or excretions on them.  Wear gloves when cleaning surfaces the patient has come in contact with.  Use a diluted bleach solution (e.g., dilute bleach with 1 part bleach and 10 parts water) or a household disinfectant with a label that says EPA-registered for coronaviruses. To make a bleach solution at home, add 1 tablespoon of bleach to 1 quart (4 cups) of water. For a larger supply, add  cup of bleach to 1 gallon (16 cups) of water.  Read labels of cleaning products and follow recommendations provided on product labels. Labels contain instructions for safe and effective use of the cleaning product including precautions you should take when applying the product, such as wearing gloves or eye protection and making sure you have good ventilation during use of the product.  Remove gloves and wash hands immediately after cleaning.  Monitor yourself for signs and symptoms of illness Caregivers and household members are considered close contacts, should monitor their health, and will be asked to limit movement outside of the home to the extent possible. Follow the monitoring steps for close contacts listed on the symptom monitoring form.   ? If you have additional questions, contact your local health department or call the epidemiologist on call at [phone] (available 24/7). ? This  guidance is subject to change. For the most up-to-date guidance from [facility], please refer to their website: [email or web address].pl

## 2020-06-17 NOTE — Progress Notes (Signed)
No IV access, MD notified and ok to leave IV out.

## 2020-06-17 NOTE — Discharge Summary (Addendum)
Megan [name] UYE:[id number] DOB: 01/08/1951 DOA: 06/10/2020  PCP: [name], MD  Admit date: 06/10/2020  Discharge date: 06/18/2020  Admitted From: Home     Disposition:  Home > SNF, patient has changed her plan on discharge 3 times in the last 3 days.  Finally willing to go home today.  Offered SNF but she refused it 2 days ago, then wanted to go home and changed her mind twice again.  Note after discharge on 06/17/2020 patient changed her mind a total of 5 more times after that and now subsequently has decided to go to SNF and not to her daughter's house.  Kindly see nursing and social work documentation.   Recommendations for Outpatient Follow-up:   Follow up with PCP in 1-2 weeks  PCP Please obtain BMP/CBC, 2 view CXR in 1week,  (see Discharge instructions)   PCP Please follow up on the following pending results: Outpatient GI and pulmonary follow-up, repeat two-view chest x-ray in a week along with CBC and BMP.   Home Health: PT,RN   Equipment/Devices: None  Consultations: None  Discharge Condition: Stable    CODE STATUS: Full    Diet Recommendation: Heart Healthy   Diet Order            Diet - low sodium heart healthy           Diet regular Room service appropriate? Yes; Fluid consistency: Thin  Diet effective now                  CC - weakness   Brief history of present illness from the day of admission and additional interim summary    69 year old with a history of COPD on home oxygen at 2lpm and HTN who presented to the ED with 3-4 days of worsening shortness of breath with a productive cough and left-sided pleuritic chest pain.  She has not been vaccinated against COVID-19, and has had other family members in her home recently who were sick.  At presentation the patient was requiring a  nonrebreather but was able to be de-escalated to 4 L nasal cannula support.  CXR confirmed bilateral pleural effusions with infiltrates diffusely.  The patient's Covid test was positive.  She was placed on IV Solu-Medrol and remdesivir.  Significant Events: 8/10 presented to [facility] ED 8/11 admitted via [facility] ED  Date of Positive COVID Test: 06/10/2020  Vaccination Status: Unvaccinated  COVID-19 specific Treatment: Remdesivir 8/10 > 8/14 Solu-Medrol 8/10 >  Antimicrobials:  Levaquin 8/10 > 8/14                                                                 Hospital Course    COVID Pneumonia - acute on chronic hypoxic respiratory failure, Continue Solu-Medrol and Remdesivir - Actemra not dosed due to  concern for concomitant bacterial pneumonia with elevated procalcitonin - inflammatory markers continue to improve and now on baseline 2 L nasal cannula oxygen.   SpO2: 92 % O2 Flow Rate (L/min): 2 L/min  Recent Labs  Lab 06/12/20 0403 06/13/20 0600 06/14/20 1451 06/15/20 0707  CRP 9.8* 5.0* 3.5* 1.7*  DDIMER 1.84* 1.26* 1.39* 1.23*  FERRITIN 168  --   --   --   PROCALCITON  --  0.56 0.10  --     Hepatic Function Latest Ref Rng & Units 06/15/2020 06/14/2020 06/13/2020  Total Protein 6.5 - 8.1 g/dL 4.8(L) 5.0(L) 5.3(L)  Albumin 3.5 - 5.0 g/dL 2.1(L) 2.2(L) 2.3(L)  AST 15 - 41 U/L '15 25 16  ' ALT 0 - 44 U/L '15 17 13  ' Alk Phosphatase 38 - 126 U/L 50 55 51  Total Bilirubin 0.3 - 1.2 mg/dL 0.6 0.6 0.3  Bilirubin, Direct 0.0 - 0.3 mg/dL - - -    Bilateral lower lobe airway occlusions -  Noted on CTa chest - suggestive of mucous plugging or aspiration -SLP evaluation noted no suggestion of dysphagia - suspect pt suffers w/ intermittent aspiration due to esophageal regurgitation, speech eval here, outpatient GI and pulmonary follow-up.  Will request PCP to arrange for it.  She does follow with Dr. Lamonte Sakai and was requested to follow with him within a week.  Esophageal  diverticulum, HH and dysmotility  Noted on GI eval June 2019 - does not appear as though pt has had GI f/u since that time, as was suggested at time of d/c - this should be considered once she is recovered from [place], outpatient GI follow-up post discharge.  Significantly elevated D-dimer - CT angio chest without evidence of PE -D-dimer now trending downward - likely simply an acute phase reactant in setting of Covid, stabilized with supportive care.  2.8 x 2.8cm cavitation in the left upper lobe - Noted on admit CXR - CT confirms this finding and suggested is most consistent with a cavitating pneumonia -follow-up CT imaging will be required to assure this resolves and if it does not malignancy will have to be considered -received a short course of Levaquin with stop date of 06/13/2020-suspect she may suffer intermittent episodic aspiration related to esophageal spasming and or dysmotility, she was seen and cleared by speech care, continue outpatient pulmonary  follow-up.  Chronic hypoxic respiratory failure due to moderate to severe COPD - Requires home O2 support at 2 L/min nasal cannula -no wheezing on exam -presently requiring only slightly more than her usual baseline oxygen support.  Mild hyponatremia - Likely reflective of dehydration - corrected with gentle volume resuscitation.  Newly diagnosed uncontrolled DM with hyperglycemia - A1c 7.3 this admission - A1c 6.05 April 2019 - initiate Glucophage - educate patient -wean steroids as rapidly as possible  HTN - Blood pressure well controlled  Normocytic anemia - No evidence of gross blood loss -anemia panel without evidence of severe iron deficiency and with adequate B12 and folate levels, follow with PCP for age-appropriate outpatient work-up.   Discharge diagnosis     Principal Problem:   Acute respiratory failure due to COVID-19 [facility]) Active Problems:   Essential hypertension   Chronic respiratory failure with hypoxia and  hypercapnia (HCC)   COPD exacerbation (HCC)   Acute respiratory disease due to COVID-19 virus   Pressure injury of skin    Discharge instructions    Discharge Instructions    MyChart COVID-19 home monitoring program   Complete by: Jun 17, 2020    Is the patient willing to use the [place] for home monitoring?: Yes   Temperature monitoring   Complete by: Jun 17, 2020    After how many days would you like to receive a notification of this patient's flowsheet entries?: 1   Diet - low sodium heart healthy   Complete by: As directed    Discharge instructions   Complete by: As directed    Follow with Primary MD [name], MD in 7 days   Get CBC, CMP, 2 view Chest X ray -  checked next visit within 1 week by Primary MD   Activity: As tolerated with Full fall precautions use walker/cane & assistance as needed  Disposition Home    Diet: Heart Healthy    Special Instructions: If you have smoked or chewed Tobacco  in the last 2 yrs please stop smoking, stop any regular Alcohol  and or any Recreational drug use.  On your next visit with your primary care physician please Get Medicines reviewed and adjusted.  Please request your Prim.MD to go over all Hospital Tests and Procedure/Radiological results at the follow up, please get all Hospital records sent to your Prim MD by signing hospital release before you go home.  If you experience worsening of your admission symptoms, develop shortness of breath, life threatening emergency, suicidal or homicidal thoughts you must seek medical attention immediately by calling 911 or calling your MD immediately  if symptoms less severe.  You Must read complete instructions/literature along with all the possible adverse reactions/side effects for all the Medicines you take and that have been prescribed to you. Take any new Medicines after you have completely understood and accpet all the possible adverse reactions/side effects.   Increase  activity slowly   Complete by: As directed    No wound care   Complete by: As directed       Discharge Medications   Allergies as of 06/18/2020      Reactions   Penicillins Rash, Hives   Has patient had a PCN reaction causing immediate rash, facial/tongue/throat swelling, SOB or lightheadedness with hypotension: No Has patient had a PCN reaction causing severe rash involving mucus membranes or skin necrosis:NO Has patient had a PCN reaction that required hospitalization No Has patient had a PCN reaction occurring within the last 10 years:NO If all of the above answers are "NO", then may proceed with Cephalosporin use.   Morphine And Related Other (See Comments)   Made the patient incoherent and she "talked out her head"   Cephalexin Rash, Other (See Comments)   Flushed, also      Medication List    STOP taking these medications   azithromycin 250 MG tablet Commonly known as: ZITHROMAX   butalbital-acetaminophen-caffeine 50-325-40 MG tablet Commonly known as: FIORICET   doxycycline 100 MG tablet Commonly known as: VIBRA-TABS   levofloxacin 500 MG tablet Commonly known as: LEVAQUIN   metoprolol tartrate 25 MG tablet Commonly known as: LOPRESSOR     TAKE these medications   acetaminophen 500 MG tablet Commonly known as: TYLENOL Take 500-1,000 mg by mouth every 6 (six) hours as needed for mild pain or headache.   albuterol 108 (90 Base) MCG/ACT inhaler Commonly known as: Ventolin HFA INHALE 2 PUFFS BY MOUTH EVERY 6 HOURS AS NEEDED FOR WHEEZING OR SHORTNESS OF BREATH What changed:   how much to take  how to take this  when to take this  reasons to take this  additional instructions   ALPRAZolam 0.25 MG tablet Commonly known as: XANAX 1 tab by mouth twice per day as needed   aspirin EC 81 MG tablet Take 81 mg by mouth daily.   azelastine 0.1 % nasal spray Commonly known as: ASTELIN Place 1 spray into both nostrils 2 (two) times daily. Use in each  nostril as directed   diltiazem 120 MG 24 hr capsule Commonly known as: CARDIZEM CD Take 1 capsule (120 mg total) by mouth daily. What changed:   medication strength  how much to take   feeding supplement (ENSURE ENLIVE) Liqd Take 237 mLs by mouth 4 (four) times daily.   fluticasone 50 MCG/ACT nasal spray Commonly known as: FLONASE SHAKE LIQUID AND USE 2 SPRAYS IN EACH NOSTRIL DAILY What changed: See the new instructions.   ipratropium-albuterol 0.5-2.5 (3) MG/3ML Soln Commonly known as: DUONEB Take 3 mLs by nebulization every 6 (six) hours as needed. What changed: reasons to take this   lidocaine 5 % Commonly known as: LIDODERM Place 1 patch onto the skin daily. Remove & Discard patch within 12 hours or as directed by MD   loratadine 10 MG tablet Commonly known as: CLARITIN Take 1 tablet (10 mg total) by mouth daily.   montelukast 10 MG tablet Commonly known as: SINGULAIR Take 1 tablet (10 mg total) by mouth at bedtime.   Mucinex 600 MG 12 hr tablet Generic drug: guaiFENesin Take 2 tablets (1,200 mg total) by mouth 2 (two) times daily.   ondansetron 4 MG tablet Commonly known as: Zofran Take 1 tablet (4 mg total) by mouth every 8 (eight) hours as needed for nausea or vomiting.   pantoprazole 40 MG tablet Commonly known as: PROTONIX TAKE 1 TABLET(40 MG) BY MOUTH DAILY What changed: See the new instructions.   polyethylene glycol 17 g packet Commonly known as: MIRALAX / GLYCOLAX Take 17 g by mouth 2 (two) times daily. What changed:   when to take this  reasons to take this   predniSONE 20 MG tablet Commonly known as: DELTASONE TAKE 1 TABLET BY MOUTH EVERY DAY WITH BREAKFAST What changed:   See the new instructions.  Another medication with the same name was removed. Continue taking this medication, and follow the directions you see here.   senna-docusate 8.6-50 MG tablet Commonly known as: Senokot-S Take 1 tablet by mouth at bedtime as needed for  mild constipation.   Tab-A-Vite/Beta Carotene Tabs Take 1 tablet by mouth daily.   tiZANidine 2 MG tablet Commonly known as: ZANAFLEX Take 1 tablet (2 mg total) by mouth every 6 (six) hours as needed for muscle spasms.   Trelegy Ellipta 100-62.5-25 MCG/INH Aepb Generic drug: Fluticasone-Umeclidin-Vilant INHALE 1 PUFF INTO THE LUNGS DAILY   Vitamin D (Ergocalciferol) 1.25 MG (50000 UNIT) Caps capsule Commonly known as: DRISDOL Take 1 capsule (50,000 Units total) by mouth every 7 (seven) days.            Durable Medical Equipment  (From admission, onward)         Start     Ordered   06/16/20 0853  For home use only DME Walker rolling  Once       Question Answer Comment  Walker: With 5 Inch Wheels   Patient needs a walker to treat with the following condition Weakness      06/16/20 0853           Follow-up Information    [facility] Follow up.   Why: Rolling walker  to be delivered to your room prior to discharge.  Contact information: [place] [address] [phone]        [facility] Follow [facility].   Specialty: [facility]       [place], [place], [place] .   Specialty: Chiropractic Medicine Contact information: [address] [address] [phone]        [facility] Follow up.   Why: [facility] will follow as extra layer of care theyy will call you daily , come to the house if needed they can draw blood if needed bring you oxygen levelr,  etc       [name], MD. Schedule an appointment as soon as possible for a visit in 1 week(s).   Specialties: Internal Medicine, Radiology Contact information: [place] [address] [phone]        Collene Gobble, MD. Schedule an appointment as soon as possible for a visit in 1 week(s).   Specialty: Pulmonary Disease Why: Follow-up on CT chest and chest x-ray results.  Must follow-up within a week. Contact  information: [place] [address] [phone]               Major procedures and Radiology Reports - PLEASE review detailed and final reports thoroughly  -         DG Chest 2 View  Result Date: 06/10/2020 CLINICAL DATA:  69 year old female with shortness of breath. Respiratory distress. EXAM: CHEST - 2 VIEW COMPARISON:  Chest radiograph dated 11/04/2018. FINDINGS: There is background of severe emphysema. There are small bilateral pleural effusions. The left lung base atelectasis/scarring versus infiltrate. There is a 2.9 x 2.4 cm focal nodular density in the left upper lobe which is new since the prior radiograph. If there is clinical concern for infection close follow-up after treatment and resolution of the symptoms recommended. If there is no clinical findings of infection, further evaluation with CT is recommended. There is no pneumothorax. The cardiac silhouette is within limits. There is bilateral hilar prominence consistent with pulmonary hypertension. Atherosclerotic calcification of the aorta. No acute osseous pathology. IMPRESSION: 1. Small bilateral pleural effusions and left lung base atelectasis/scarring versus infiltrate. 2. Focal nodular density in the left upper lobe, new since the prior radiograph. Follow-up to resolution or further evaluation with CT is recommended. 3. Aortic Atherosclerosis (ICD10-I70.0) and Emphysema (ICD10-J43.9). Electronically Signed   By: Anner Crete M.D.   On: 06/10/2020 16:34   CT ANGIO CHEST PE W OR WO CONTRAST  Result Date: 06/11/2020 CLINICAL DATA:  Evaluate for acute pulmonary embolus. High probability. COVID positive. EXAM: CT ANGIOGRAPHY CHEST WITH CONTRAST TECHNIQUE: Multidetector CT imaging of the chest was performed using the standard protocol during bolus administration of intravenous contrast. Multiplanar CT image reconstructions and MIPs were obtained to evaluate the vascular anatomy. CONTRAST:  30m  OMNIPAQUE IOHEXOL 350 MG/ML SOLN COMPARISON:  CT angio chest 01/03/2017. FINDINGS: Cardiovascular: The main pulmonary artery is patent. No lobar or segmental pulmonary artery filling defects identified bilaterally.Heart size appears normal. No pericardial effusion identified. Aortic atherosclerosis Mediastinum/Nodes: Normal appearance of the thyroid gland. The trachea appears patent and is midline. Esophagus appears dilated and patulous with fluid and debris filling the distal lumen. No axillary, supraclavicular, mediastinal or hilar adenopathy. . Lungs/Pleura: Moderate to severe emphysema. Small bilateral pleural effusions noted left greater than right. Occlusion of the right lower lobe airways noted with secondary partial atelectasis of  the right lower lobe. Filling defects are also noted within the left lower lobe airways with mild changes of postobstructive pneumonitis. Mild changes of pneumonitis noted within the lingula. Within the anterior lateral left upper lobe there is a focal area of subpleural airspace consolidation with central cavitation measuring 2.8 by 2.8 cm, image 51/7. Multiple calcified granulomas are identified within the left upper lobe. Upper Abdomen: No acute abnormality. Musculoskeletal: No chest wall abnormality. No acute or significant osseous findings. Review of the MIP images confirms the above findings. IMPRESSION: 1. No evidence for acute pulmonary embolus. 2. Bilateral lower lobe airways are occluded with mucus and or debris with partial atelectasis of the right lower lobe and mild postobstructive pneumonitis within the lingula and left lower lobe. Findings are favored to represent sequelae of aspiration and/or mucous plugging. 3. There is a focal area of subpleural airspace consolidation with central cavitation in the anterior lateral left upper lobe. In the acute setting findings are favored to represent an area of cavitating pneumonia. Focus of septic emboli is considered less  favored given the lack of additional cavitating nodules. This will require follow-up imaging to confirm resolution and to exclude underlying malignancy. 4. Small bilateral pleural effusions left greater than right. 5. Emphysema and aortic atherosclerosis. Aortic Atherosclerosis (ICD10-I70.0) and Emphysema (ICD10-J43.9). Electronically Signed   By: Kerby Moors M.D.   On: 06/11/2020 10:53   DG Abd Portable 1V  Result Date: 06/17/2020 CLINICAL DATA:  Nausea and vomiting EXAM: PORTABLE ABDOMEN - 1 VIEW COMPARISON:  02/15/2018 FINDINGS: Formed stool throughout most colonic segments without evidence of obstruction. No concerning mass effect or gas collection. Right lower lobe opacity as evaluated by recent chest CT. IMPRESSION: Generalized stool retention.  No evidence of obstruction. Electronically Signed   By: Monte Fantasia M.D.   On: 06/17/2020 09:38    Micro Results     Recent Results (from the past 240 hour(s))  SARS Coronavirus 2 by RT PCR (hospital order, performed in [facility] hospital lab) Nasopharyngeal Nasopharyngeal Swab     Status: Abnormal   Collection Time: 06/10/20  3:48 PM   Specimen: Nasopharyngeal Swab  Result Value Ref Range Status   SARS Coronavirus 2 POSITIVE (A) NEGATIVE Final    Comment: RESULT CALLED TO, READ BACK BY AND VERIFIED WITH: L,CHILTON '@[id number]'  06/10/20 EB (NOTE) SARS-CoV-2 target nucleic acids are DETECTED  SARS-CoV-2 RNA is generally detectable in upper respiratory specimens  during the acute phase of infection.  Positive results are indicative  of the presence of the identified virus, but do not rule out bacterial infection or co-infection with other pathogens not detected by the test.  Clinical correlation with patient history and  other diagnostic information is necessary to determine patient infection status.  The expected result is negative.  Fact Sheet for Patients:   StrictlyIdeas.no   Fact Sheet for Healthcare  Providers:   BankingDealers.co.za    This test is not yet approved or cleared by the [place] FDA and  has been authorized for detection and/or diagnosis of SARS-CoV-2 by FDA under an Emergency Use Authorization (EUA).  This EUA will remain in effect (meaning this test can b e used) for the duration of  the COVID-19 declaration under Section 564(b)(1) of the Act, 21 U.S.C. section 360-bbb-3(b)(1), unless the authorization is terminated or revoked sooner.  Performed at [facility], [place] [address], [place], [place] [address]   Blood Culture (routine x 2)     Status: None   Collection  Time: 06/10/20  7:47 PM   Specimen: BLOOD  Result Value Ref Range Status   Specimen Description BLOOD LEFT ANTECUBITAL  Final   Special Requests   Final    BOTTLES DRAWN AEROBIC AND ANAEROBIC Blood Culture results may not be optimal due to an inadequate volume of blood received in culture bottles   Culture   Final    NO GROWTH 5 DAYS Performed at [facility], [place] [address] [place], [place] [address]    Report Status 06/15/2020 FINAL  Final  Blood Culture (routine x 2)     Status: None   Collection Time: 06/10/20  9:22 PM   Specimen: BLOOD RIGHT HAND  Result Value Ref Range Status   Specimen Description BLOOD RIGHT HAND  Final   Special Requests   Final    BOTTLES DRAWN AEROBIC AND ANAEROBIC Blood Culture adequate volume   Culture   Final    NO GROWTH 5 DAYS Performed at [facility], [place] [address], [place], [place] [address]    Report Status 06/15/2020 FINAL  Final    Today   Subjective    Megan [name] today has no headache,no chest abdominal pain,no new weakness tingling or numbness, feels much better wants to go home today.     Objective   Blood pressure 107/60, pulse 64, temperature 98 F (36.7 C), temperature source Axillary, resp. rate 16, height '5\' 3"'  (1.6 m), weight 43.1 kg, SpO2 94 %.   Intake/Output Summary (Last 24 hours) at  06/18/2020 1040 Last data filed at 06/18/2020 0919 Gross per 24 hour  Intake 680 ml  Output --  Net 680 ml    Exam  Awake Alert, No new F.N deficits, Normal affect [place].AT,PERRAL Supple Neck,No JVD, No cervical lymphadenopathy appriciated.  Symmetrical Chest wall movement, Good air movement bilaterally, CTAB RRR,No Gallops,Rubs or new Murmurs, No Parasternal Heave +ve B.Sounds, Abd Soft, Non tender, No organomegaly appriciated, No rebound -guarding or rigidity. No Cyanosis, Clubbing or edema, No new Rash or bruise   Data Review   CBC w Diff:  Lab Results  Component Value Date   WBC 14.6 (H) 06/15/2020   HGB 11.1 (L) 06/15/2020   HCT 36.9 06/15/2020   PLT 376 06/15/2020   LYMPHOPCT 3 06/15/2020   MONOPCT 8 06/15/2020   EOSPCT 0 06/15/2020   BASOPCT 0 06/15/2020    CMP:  Lab Results  Component Value Date   NA 137 06/16/2020   K 4.4 06/16/2020   CL 87 (L) 06/16/2020   CO2 44 (H) 06/16/2020   BUN 34 (H) 06/16/2020   CREATININE 0.75 06/16/2020   PROT 4.8 (L) 06/15/2020   ALBUMIN 2.1 (L) 06/15/2020   BILITOT 0.6 06/15/2020   ALKPHOS 50 06/15/2020   AST 15 06/15/2020   ALT 15 06/15/2020  .   Total Time in preparing paper work, data evaluation and todays exam - 22 minutes  Lala Lund M.D on 06/18/2020 at 10:40 AM  [facility]   Office  [phone]

## 2020-06-17 NOTE — Progress Notes (Signed)
Ducolax Supp and miralax given to patient for c/o nasea and constipation. Pt had a large BM, denies nausea/vomitting, glucose level stable. Tolerating lunch 50% plus glucerna. pt states she is well and ready to be discharged home to her family. Patient was discharged home by MD order; discharged instructions  review and give to patient with care notes and prescriptions; IV DIC; skin intact; patient will be escorted to the car by nurse tech via wheelchair.

## 2020-06-17 NOTE — Progress Notes (Addendum)
1711-Transportation arranged per pt's request. Pt changes her mind numerous times throughout yesterday and today of which place she wanted to be discharged to home vs rehab. Today she refused rehab and wanted to be discharged home. Yesterday pt req to extend her stay in the hosp and she receive done more night. Today she is changing her mind back and forth again as she was waiting for her ride home while agrees to be discharged home. Will wait for her ride to get here per her request to transport her home safely. 1739- attempted to call daughter Randel Pigg and son Virgina Evener. Both denied knowing the patient. 1800- Pt refuses to be discharged home tonight. Pt stated "my daughter is having a baby and I am worried about the baby getting sick". Social worker has left for the day, I was able to obtain her daughter Azalee Course phone number [phone] regarding the plan for patient's returning home tonight. [name] stated that "My mother may not be discharged home due to her positive [phone] and that the patient has to be negative covid19 before she can be discharged to her house due to her two immunocompromised children at home. Despite extensive explanation of pt's covid treatment completion and her mobility is back to baseline, pt refuses a rehab, and that she is medically cleared per MD for discharged but pt's daughter and pt refused discharged. MD notified, PTAR canceled, will assess tomorrow with social worker for another discharge plan. Discharged canceled for tonight.

## 2020-06-17 NOTE — Progress Notes (Signed)
Inpatient Diabetes Program Recommendations  AACE/ADA: New Consensus Statement on Inpatient Glycemic Control (2015)  Target Ranges:  Prepandial:   less than 140 mg/dL      Peak postprandial:   less than 180 mg/dL (1-2 hours)      Critically ill patients:  140 - 180 mg/dL   Lab Results  Component Value Date   GLUCAP 68 (L) 06/17/2020   HGBA1C 7.3 (H) 06/13/2020    Review of Glycemic Control Results for Megan [name], Megan [name] (MRN [id number]) as of 06/17/2020 08:23  Ref. Range 06/16/2020 08:05 06/16/2020 08:33 06/16/2020 11:43 06/16/2020 12:14 06/16/2020 12:32 06/16/2020 15:32 06/16/2020 20:46 06/17/2020 08:08  Glucose-Capillary Latest Ref Range: 70 - 99 mg/dL 48 (L) 92 49 (L) 42 (LL) 72 116 (H) 180 (H) 68 (L)   Diabetes history: new diagnosis  Current orders for Inpatient glycemic control:  Metformin 500 mg bid Novolog 0-9 units tid + hs  Inpatient Diabetes Program Recommendations:    Note hypoglycemia.   Has not received Novolog in 24 hours received one dose of metformin 500 mg last night.   -  May just need to check glucose trends at home and contact PCP with elevations.  Blood glucose meter kit order # [id number]  Thanks,  Tama Headings RN, MSN, BC-ADM Inpatient Diabetes Coordinator Team Pager [phone] (8a-5p)

## 2020-06-17 NOTE — TOC Transition Note (Signed)
Transition of Care [facility]) - CM/SW Discharge Note   Patient Details  Name: Alanya Vukelich MRN: [id number] Date of Birth: 1951/10/22  Transition of Care [facility]) CM/SW Contact:  Verdell Carmine, RN Phone Number: 06/17/2020, 8:25 AM   Clinical Narrative:    Discharge home today is set up with [facility] services with advance home health. Would benefit from using remote health ,as another layer of care would assist in decreasing chance of readmit. Daughter has just delivered a baby a couple of days ago and will not be able to assist with mother much., even though she will be present.    Final next level of care: Home w Home Health Services Barriers to Discharge: Family Issues (Patient requesting to stay another day due to her daughter having a baby)   Patient Goals and CMS Choice Patient states their goals for this hospitalization and ongoing recovery are:: Return home CMS Medicare.gov Compare Post Acute Care list provided to:: Patient Choice offered to / list presented to : Patient  Discharge Placement                       Discharge Plan and Services In-house Referral: Clinical Social Work Discharge Planning Services: CM Consult Post Acute Care Choice: Home Health, Durable Medical Equipment          DME Arranged: 3-N-1 DME Agency: [facility]       HH Arranged: PT, Nurse's Aide, RN [place] Agency: [facility] [place] ([facility]) Date HH Agency Contacted: 06/16/20 Time [place]: 1200 Representative spoke with at [place]: Janae Sauce  Social Determinants of Health ([place]) Interventions     Readmission Risk Interventions No flowsheet data found.

## 2020-06-17 NOTE — Progress Notes (Signed)
PTAR contacted to transport patient home at request of patient. Forms received by nursing station.   Cheila Wickstrom LCSW

## 2020-06-18 DIAGNOSIS — E1169 Type 2 diabetes mellitus with other specified complication: Secondary | ICD-10-CM | POA: Diagnosis not present

## 2020-06-18 DIAGNOSIS — Z7401 Bed confinement status: Secondary | ICD-10-CM | POA: Diagnosis not present

## 2020-06-18 DIAGNOSIS — R1319 Other dysphagia: Secondary | ICD-10-CM | POA: Diagnosis not present

## 2020-06-18 DIAGNOSIS — J189 Pneumonia, unspecified organism: Secondary | ICD-10-CM | POA: Diagnosis not present

## 2020-06-18 DIAGNOSIS — R0602 Shortness of breath: Secondary | ICD-10-CM | POA: Diagnosis not present

## 2020-06-18 DIAGNOSIS — E43 Unspecified severe protein-calorie malnutrition: Secondary | ICD-10-CM | POA: Diagnosis not present

## 2020-06-18 DIAGNOSIS — M6259 Muscle wasting and atrophy, not elsewhere classified, multiple sites: Secondary | ICD-10-CM | POA: Diagnosis not present

## 2020-06-18 DIAGNOSIS — M6281 Muscle weakness (generalized): Secondary | ICD-10-CM | POA: Diagnosis not present

## 2020-06-18 DIAGNOSIS — M2550 Pain in unspecified joint: Secondary | ICD-10-CM | POA: Diagnosis not present

## 2020-06-18 DIAGNOSIS — R278 Other lack of coordination: Secondary | ICD-10-CM | POA: Diagnosis not present

## 2020-06-18 DIAGNOSIS — Z09 Encounter for follow-up examination after completed treatment for conditions other than malignant neoplasm: Secondary | ICD-10-CM | POA: Diagnosis not present

## 2020-06-18 DIAGNOSIS — J9601 Acute respiratory failure with hypoxia: Secondary | ICD-10-CM | POA: Diagnosis not present

## 2020-06-18 DIAGNOSIS — U071 COVID-19: Secondary | ICD-10-CM | POA: Diagnosis not present

## 2020-06-18 DIAGNOSIS — B370 Candidal stomatitis: Secondary | ICD-10-CM | POA: Diagnosis not present

## 2020-06-18 DIAGNOSIS — G4700 Insomnia, unspecified: Secondary | ICD-10-CM | POA: Diagnosis not present

## 2020-06-18 DIAGNOSIS — J9600 Acute respiratory failure, unspecified whether with hypoxia or hypercapnia: Secondary | ICD-10-CM | POA: Diagnosis not present

## 2020-06-18 DIAGNOSIS — J1282 Pneumonia due to coronavirus disease 2019: Secondary | ICD-10-CM | POA: Diagnosis not present

## 2020-06-18 DIAGNOSIS — J441 Chronic obstructive pulmonary disease with (acute) exacerbation: Secondary | ICD-10-CM | POA: Diagnosis not present

## 2020-06-18 DIAGNOSIS — R1314 Dysphagia, pharyngoesophageal phase: Secondary | ICD-10-CM | POA: Diagnosis not present

## 2020-06-18 LAB — GLUCOSE, CAPILLARY
Glucose-Capillary: 91 mg/dL (ref 70–99)
Glucose-Capillary: 110 mg/dL — ABNORMAL HIGH (ref 70–99)

## 2020-06-18 MED ORDER — ALPRAZOLAM 0.25 MG PO TABS: ORAL_TABLET | ORAL | 0 refills | Status: DC

## 2020-06-18 MED ORDER — DILTIAZEM HCL ER COATED BEADS 180 MG PO CP24
180.0000 mg | ORAL_CAPSULE | Freq: Every day | ORAL | Status: DC
  Administered 2020-06-18: 180 mg via ORAL
  Filled 2020-06-18: qty 1

## 2020-06-18 MED ORDER — LIDOCAINE 5 % EX PTCH: 1.0000 | MEDICATED_PATCH | CUTANEOUS | 0 refills | Status: DC

## 2020-06-18 NOTE — TOC Progression Note (Addendum)
Transition of Care [facility]) - Progression Note    Patient Details  Name: Megan [name] MRN: [id number] Date of Birth: 16-Jun-1951  Transition of Care [facility]) CM/SW [place], LCSW Phone Number: 06/18/2020, 8:35 AM  Clinical Narrative:    CSW received request to assist patient as her family is not allowing her to come home due to COVID status. CSW spoke with patient. She requested to speak at a later time due to nausea , however CSW explained that we needed to figure out her plan as she has been discharged for the past few days. She reported understanding and stated that she needed to go to a SNF. CSW acknowledged the request and made her aware that the only COVID accepting facility with beds is [facility]. She reported agreement. Megan [name] is reviewing referral for possible discharge today.   UPDATE: Megan [name] is able to accept patient today. Will arrange PTAR for transport.   Expected Discharge Plan: [place] Barriers to Discharge: Family Issues (Patient requesting to stay another day due to her daughter having a baby)  Expected Discharge Plan and Services Expected Discharge Plan: [place] In-house Referral: Clinical Social Work Discharge Planning Services: CM Consult Post Acute Care Choice: Home Health, Durable Medical Equipment Living arrangements for the past 2 months: [place] Expected Discharge Date: 06/17/20               DME Arranged: 3-N-1 DME Agency: [facility]       HH Arranged: PT, Nurse's Aide, RN [place] Agency: [place] ([place]) Date HH Agency Contacted: 06/16/20 Time [place]: 1200 Representative spoke with at [place]: [place] (SDOH) Interventions    Readmission Risk Interventions No flowsheet data found.

## 2020-06-18 NOTE — Progress Notes (Signed)
Physical Therapy Treatment Patient Details Name: Megan [name] MRN: [id number] DOB: 1951-05-01 Today's Date: 06/18/2020    History of Present Illness Pt is a 69 y.o. female with PMH of COPD (2L home O2), GERD, HTN, who presented 06/10/20 with 3-4 days of worsening SOB with a productive cough and left-sided pleuritic chest pain. Workup for COVID-19. Pt not vaccinated.    PT Comments    Pt making slow, steady progress with mobility. Continue to recommend SNF and at this time pt is agreeable to that.    Follow Up Recommendations  SNF     Equipment Recommendations  Rolling walker with 5" wheels    Recommendations for Other Services       Precautions / Restrictions Precautions Precautions: Fall;Other (comment) Precaution Comments: Wears 2L O2 baseline    Mobility  Bed Mobility Overal bed mobility: Modified Independent Bed Mobility: Supine to Sit     Supine to sit: Modified independent (Device/Increase time)        Transfers Overall transfer level: Needs assistance Equipment used: Rolling walker (2 wheeled) Transfers: Sit to/from Stand Sit to Stand: Min guard         General transfer comment: Min Guard A for safety. Verbal cues for hand placement. Repeated x 5  Ambulation/Gait Ambulation/Gait assistance: Supervision Gait Distance (Feet): 150 Feet Assistive device: Rolling walker (2 wheeled) Gait Pattern/deviations: Step-through pattern;Decreased stride length Gait velocity: Decreased Gait velocity interpretation: <1.8 ft/sec, indicate of risk for recurrent falls General Gait Details: Supervision for safety. Amb on 2L of O2 as per her baseline   Social research officer, government [place] (Stroke Patients Only)       Balance Overall balance assessment: Needs assistance Sitting-balance support: No upper extremity supported;Feet supported Sitting balance-Leahy Scale: Good     Standing balance support: During functional activity;No  upper extremity supported Standing balance-Leahy Scale: Fair                              Cognition Arousal/Alertness: Awake/alert Behavior During Therapy: WFL for tasks assessed/performed Overall Cognitive Status: No family/caregiver present to determine baseline cognitive functioning Area of Impairment: Attention;Awareness;Problem solving                   Current Attention Level: Selective     Safety/Judgement: Decreased awareness of safety Awareness: Emergent Problem Solving: Requires verbal cues        Exercises      General Comments        Pertinent Vitals/Pain Pain Assessment: Faces Faces Pain Scale: No hurt    Home Living                      Prior Function            PT Goals (current goals can now be found in the care plan section) Acute Rehab PT Goals PT Goal Formulation: With patient Time For Goal Achievement: 06/25/20 Potential to Achieve Goals: Good Progress towards PT goals: Goals met and updated - see care plan    Frequency    Min 2X/week      PT Plan Discharge plan needs to be updated;Frequency needs to be updated    Co-evaluation              AM-PAC PT "6 Clicks" Mobility   Outcome Measure  Help needed turning from your back to  your side while in a flat bed without using bedrails?: None Help needed moving from lying on your back to sitting on the side of a flat bed without using bedrails?: None Help needed moving to and from a bed to a chair (including a wheelchair)?: A Little Help needed standing up from a chair using your arms (e.g., wheelchair or bedside chair)?: A Little Help needed to walk in hospital room?: A Little Help needed climbing 3-5 steps with a railing? : A Little 6 Click Score: 20    End of Session Equipment Utilized During Treatment: Oxygen Activity Tolerance: Patient tolerated treatment well Patient left: in chair;with call bell/phone within reach;with chair alarm set Nurse  Communication: Mobility status PT Visit Diagnosis: Unsteadiness on feet (R26.81);Muscle weakness (generalized) (M62.81)     Time: 8316-7425 PT Time Calculation (min) (ACUTE ONLY): 23 min  Charges:  $Gait Training: 23-37 mins                     [place] Pager [phone] Office [place] 06/18/2020, 9:46 AM

## 2020-06-18 NOTE — Consult Note (Signed)
   [facility] Inpatient Consult   06/18/2020  Megan [name] 19-Aug-1951 [id number]   Update:  Patient transitioned to SNF, [name] will notify [facility] RN regarding transitional care needs regarding returning to community follow up needs.  For questions, please contact:  Natividad Brood, RN BSN [facility] Liaison  [phone] business mobile phone Toll free office [phone]  Fax number: [phone] [name]

## 2020-06-18 NOTE — NC FL2 (Signed)
[place] LEVEL OF CARE SCREENING TOOL     IDENTIFICATION  Patient Name: Megan [name] Birthdate: 02-23-1951 Sex: female Admission Date (Current Location): 06/10/2020  [facility] and [place] Number:  Herbalist and Address:  The [place], [place] [address] [place], [place] [address]      Provider Number: [id number]  Attending Physician Name and Address:  Thurnell Lose, MD  Relative Name and Phone Number:  [name], daughter,  [phone]    Current Level of Care: Hospital Recommended Level of Care: [place] Prior Approval Number:    Date Approved/Denied:   PASRR Number: [id number]  Discharge Plan: SNF    Current Diagnoses: Patient Active Problem List   Diagnosis Date Noted  . Pressure injury of skin 06/17/2020  . Acute respiratory disease due to COVID-19 virus 06/11/2020  . Acute respiratory failure due to COVID-19 ([place]) 06/10/2020  . Nail abnormality 08/07/2019  . COPD exacerbation ([place]) 04/19/2019  . Anemia 04/19/2019  . Urinary frequency 09/22/2018  . Chills without fever 09/22/2018  . Thoracic back pain 07/25/2018  . Low back pain 07/25/2018  . Coccyx pain 07/25/2018  . Fever 05/03/2018  . Food impaction of esophagus   . Abnormal esophagram   . Abdominal pain 04/16/2018  . Epigastric pain 04/16/2018  . Chest pain 04/16/2018  . Chronic respiratory failure with hypoxia ([place]) 04/16/2018  . Dysphagia 04/16/2018  . Odynophagia 04/16/2018  . Allergic rhinitis 03/31/2018  . Malnutrition of moderate degree 02/27/2018  . 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 Palliative care by specialist   . Acute on Chronic respiratory failure with hypoxia and SEVERE Hypercapnia ([place]) 02/13/2018  . Acute pain of right shoulder 01/07/2018  . Flu-like symptoms 12/09/2017  . Fibromyalgia 09/16/2017  . Fatigue 09/16/2017  . Chronic respiratory failure with hypoxia and hypercapnia  (HCC)   . Sepsis ([place])   . Hyperglycemia 01/05/2017  . Hair loss 06/22/2016  . Rash 06/22/2016  . Mixed headache 03/19/2016  . COPD (chronic obstructive pulmonary disease) ([place]) 03/19/2016  . Esophageal reflux 09/01/2015  . Goals of care, counseling/discussion   . Palliative care encounter   . Anxiety 08/20/2015  . Chronic post-traumatic stress disorder (PTSD) 08/20/2015  . Multiple pulmonary nodules determined by computed tomography of lung 08/19/2015  . Essential hypertension 08/19/2015  . Protein-calorie malnutrition, severe 08/19/2015    Orientation RESPIRATION BLADDER Height & Weight     Self, Time, Situation, Place  O2 (2L Nasal cannula) Continent Weight: 95 lb 0.3 oz (43.1 kg) Height:  5\' 3"  (160 cm)  BEHAVIORAL SYMPTOMS/MOOD NEUROLOGICAL BOWEL NUTRITION STATUS      Continent Diet  AMBULATORY STATUS COMMUNICATION OF NEEDS Skin   Limited Assist Verbally Normal                       Personal Care Assistance Level of Assistance  Bathing, Dressing, Feeding Bathing Assistance: Limited assistance Feeding assistance: Independent Dressing Assistance: Limited assistance     Functional Limitations Info  Sight, Hearing, Speech Sight Info: Adequate Hearing Info: Impaired Speech Info: Adequate    SPECIAL CARE FACTORS FREQUENCY  PT (By licensed PT), OT (By licensed OT)     PT Frequency: 5x/week OT Frequency: 3x/week            Contractures Contractures Info: Not present    Additional Factors Info  Code Status, Allergies, Isolation Precautions, Psychotropic Code Status Info: Full Allergies Info: Penicillins,  Morphine And Related, Cephalexin Psychotropic Info: Xanax   Isolation Precautions Info: COVID+     Current Medications (06/18/2020):  This is the current hospital active medication list Current Facility-Administered Medications  Medication Dose Route Frequency Provider Last Rate Last Admin  . acetaminophen (TYLENOL) tablet 650 mg  650 mg Oral Q6H PRN  Vianne Bulls, MD   650 mg at 06/17/20 2235  . albuterol (VENTOLIN HFA) 108 (90 Base) MCG/ACT inhaler 1-2 puff  1-2 puff Inhalation Q4H PRN Cherene Altes, MD      . ALPRAZolam Duanne Moron) tablet 0.25 mg  0.25 mg Oral BID PRN Rise Patience, MD   0.25 mg at 06/17/20 2235  . aspirin EC tablet 81 mg  81 mg Oral Daily Rise Patience, MD   81 mg at 06/17/20 7425  . azelastine (ASTELIN) 0.1 % nasal spray 1 spray  1 spray Each Nare BID Cherene Altes, MD   1 spray at 06/17/20 2234  . bisacodyl (DULCOLAX) suppository 10 mg  10 mg Rectal Daily Thurnell Lose, MD   10 mg at 06/17/20 1206  . diltiazem (CARDIZEM CD) 24 hr capsule 180 mg  180 mg Oral Daily Lala Lund K, MD      . enoxaparin (LOVENOX) injection 30 mg  30 mg Subcutaneous Daily Rumbarger, Valeda Malm, RPH   30 mg at 06/17/20 0858  . feeding supplement (ENSURE ENLIVE) (ENSURE ENLIVE) liquid 237 mL  237 mL Oral TID BM Cherene Altes, MD   237 mL at 06/16/20 2030  . fluticasone (FLONASE) 50 MCG/ACT nasal spray 2 spray  2 spray Each Nare Daily Cherene Altes, MD   2 spray at 06/17/20 0900  . fluticasone furoate-vilanterol (BREO ELLIPTA) 100-25 MCG/INH 1 puff  1 puff Inhalation Daily Rise Patience, MD   1 puff at 06/17/20 0855  . guaiFENesin-dextromethorphan (ROBITUSSIN DM) 100-10 MG/5ML syrup 10 mL  10 mL Oral Q4H PRN Rise Patience, MD   10 mL at 06/15/20 0854  . insulin aspart (novoLOG) injection 0-5 Units  0-5 Units Subcutaneous QHS Cherene Altes, MD   5 Units at 06/12/20 2134  . insulin aspart (novoLOG) injection 0-9 Units  0-9 Units Subcutaneous TID WC Cherene Altes, MD   2 Units at 06/15/20 1753  . lidocaine (LIDODERM) 5 % 1 patch  1 patch Transdermal Q24H Rise Patience, MD   1 patch at 06/17/20 [phone]  . loratadine (CLARITIN) tablet 10 mg  10 mg Oral Daily Cherene Altes, MD   10 mg at 06/17/20 0852  . melatonin tablet 3 mg  3 mg Oral QHS Opyd, Ilene Qua, MD   3 mg at 06/17/20 2235   . metFORMIN (GLUCOPHAGE) tablet 500 mg  500 mg Oral BID WC Cherene Altes, MD   500 mg at 06/17/20 0852  . metoprolol tartrate (LOPRESSOR) tablet 12.5 mg  12.5 mg Oral BID Thurnell Lose, MD      . montelukast (SINGULAIR) tablet 10 mg  10 mg Oral QHS Cherene Altes, MD   10 mg at 06/17/20 2235  . ondansetron (ZOFRAN) injection 4 mg  4 mg Intravenous Q6H PRN Rise Patience, MD   4 mg at 06/17/20 0915  . pantoprazole (PROTONIX) EC tablet 40 mg  40 mg Oral Daily Rise Patience, MD   40 mg at 06/17/20 0853  . polyethylene glycol (MIRALAX / GLYCOLAX) packet 17 g  17 g Oral BID Thurnell Lose, MD  17 g at 06/17/20 2234  . umeclidinium bromide (INCRUSE ELLIPTA) 62.5 MCG/INH 1 puff  1 puff Inhalation Daily Cherene Altes, MD   1 puff at 06/17/20 0856     Discharge Medications: Please see discharge summary for a list of discharge medications.  Relevant Imaging Results:  Relevant Lab Results:   Additional Information SSN: [id number] [place], [place]

## 2020-06-18 NOTE — Progress Notes (Signed)
Report given to [place] at [place].

## 2020-06-18 NOTE — Progress Notes (Signed)
   06/18/20 0857  Assess: MEWS Score  Temp 98.4 F (36.9 C)  BP (!) 97/53  Pulse Rate (!) 106  Resp 18  Level of Consciousness Alert  SpO2 93 %  O2 Device Nasal Cannula  Patient Activity (if Appropriate) In bed  O2 Flow Rate (L/min) 2 L/min  Assess: MEWS Score  MEWS Temp 0  MEWS Systolic 1  MEWS Pulse 1  MEWS RR 0  MEWS LOC 0  MEWS Score 2  MEWS Score Color Yellow  Assess: if the MEWS score is Yellow or Red  Were vital signs taken at a resting state? Yes  Focused Assessment Change from prior assessment (see assessment flowsheet)  Early Detection of Sepsis Score *See Row Information* Low  MEWS guidelines implemented *See Row Information* Yes  Treat  MEWS Interventions Escalated (See documentation below)  Pain Scale 0-10  Pain Score 0  Take Vital Signs  Increase Vital Sign Frequency  Yellow: Q 2hr X 2 then Q 4hr X 2, if remains yellow, continue Q 4hrs  Escalate  MEWS: Escalate Yellow: discuss with charge nurse/RN and consider discussing with provider and RRT  Notify: Charge Nurse/RN  Name of Charge Nurse/RN Notified Judson Roch  Date Charge Nurse/RN Notified 06/18/20  Time Charge Nurse/RN Notified 0910  Document  Patient Outcome Stabilized after interventions

## 2020-06-18 NOTE — TOC Transition Note (Addendum)
Transition of Care [facility]) - CM/SW Discharge Note   Patient Details  Name: Megan [name] MRN: [id number] Date of Birth: May 14, 1951  [facility] CM/SW Contact:  Benard [place]sted, LCSW Phone Number: 06/18/2020, 11:00 AM   Clinical Narrative:    Patient will DC to: [facility] Anticipated DC date: 06/18/20 Family notified: Pt notified dtr Transport by: Corey Harold    Per MD patient ready for DC to [facility]. RN, patient, patient's family, and facility notified of DC. Discharge Summary and FL2 sent to facility. RN to call report prior to discharge [phone] Room 808A). DC packet on chart. Ambulance transport requested for patient.   CSW will sign off for now as social work intervention is no longer needed. Please consult [place] again if new needs arise.      Final next level of care: Skilled Nursing Facility Barriers to Discharge: Barriers Resolved   Patient Goals and CMS Choice Patient states their goals for this hospitalization and ongoing recovery are:: Return home CMS Medicare.gov Compare Post Acute Care list provided to:: Patient Choice offered to / list presented to : Patient  Discharge Placement   Existing PASRR number confirmed : 06/18/20          Patient [facility] bed at: [facility] Patient to be transferred to facility by: [place] Name of family member notified: Pt notified dtr Patient and family notified of of transfer: 06/18/20  Discharge Plan and Services In-house Referral: Clinical Social Work Discharge Planning Services: [employer] Consult Post Acute Care Choice: Home Health, Durable Medical Equipment          DME Arranged: 3-N-1 DME Agency: [facility]       HH Arranged: PT, Nurse's Aide, RN [place] Agency: [place] ([place]) Date HH Agency Contacted: 06/16/20 Time [place]: 1200 Representative spoke with at [place]: [place] ([place]) Interventions     Readmission Risk Interventions No flowsheet data  found.

## 2020-06-18 NOTE — Progress Notes (Signed)
Attempted call report twice to [facility] to the RN there and there was no answer.  Receptionist Abigail Butts stated she will have RN Jody call back for the report.   I did advise PTAR has picked patient up and is now leaving the floor.

## 2020-06-18 NOTE — Progress Notes (Signed)
Darin Redmann to be discharged Skilled nursing facility per MD order. Discussed prescriptions and follow up appointments with the patient.   Skin clean, dry and intact without evidence of skin break down, no evidence of skin tears noted. IV catheter discontinued intact. Site without signs and symptoms of complications. Dressing and pressure applied. Pt denies pain at the site currently. No complaints noted.  Patient free of lines, drains, and wounds.  An After Visit Summary (AVS) was printed and given to PTAR. Patient escorted via PTAR, and discharged [employer]. Report given to nurse at [facility].  Cathlean Marseilles Tamala Julian, MSN, RN, [place], AGCNS

## 2020-06-19 DIAGNOSIS — E1169 Type 2 diabetes mellitus with other specified complication: Secondary | ICD-10-CM | POA: Diagnosis not present

## 2020-06-19 DIAGNOSIS — J441 Chronic obstructive pulmonary disease with (acute) exacerbation: Secondary | ICD-10-CM | POA: Diagnosis not present

## 2020-06-19 DIAGNOSIS — U071 COVID-19: Secondary | ICD-10-CM | POA: Diagnosis not present

## 2020-06-19 DIAGNOSIS — G4700 Insomnia, unspecified: Secondary | ICD-10-CM | POA: Diagnosis not present

## 2020-06-23 ENCOUNTER — Other Ambulatory Visit: Payer: Self-pay | Admitting: *Deleted

## 2020-06-23 NOTE — Patient Outreach (Signed)
Member screened for potential [facility] Care Management needs as a benefit of [place] Medicare.  Made aware by [facility] Liaison that Mrs. Westberg transitioned to [facility] SNF on 06/18/20. Unable to verify in Patient Ping.  Communication sent to [facility] SW to inquire about anticipated transition plans and potential [facility] Care Management needs.   Will continue to follow while member resides in SNF.   Marthenia Rolling, MSN-Ed, RN,BSN [place] Acute Care Coordinator [phone] [facility]) [phone]  (Toll free office)

## 2020-06-24 DIAGNOSIS — J441 Chronic obstructive pulmonary disease with (acute) exacerbation: Secondary | ICD-10-CM | POA: Diagnosis not present

## 2020-06-24 DIAGNOSIS — U071 COVID-19: Secondary | ICD-10-CM | POA: Diagnosis not present

## 2020-06-24 DIAGNOSIS — M6281 Muscle weakness (generalized): Secondary | ICD-10-CM | POA: Diagnosis not present

## 2020-06-24 DIAGNOSIS — E43 Unspecified severe protein-calorie malnutrition: Secondary | ICD-10-CM | POA: Diagnosis not present

## 2020-07-10 ENCOUNTER — Other Ambulatory Visit: Payer: Self-pay | Admitting: *Deleted

## 2020-07-10 NOTE — Patient Outreach (Signed)
[facility] Post- Acute Care Coordinator follow up. Member screened for potential [facility] Care Management needs as a benefit of [place] Medicare.  Verified in Patient Megan [name] that Megan [name] discharged from [facility] on 07/08/20.   Telephone call made to Mrs.  [name] at [phone] to discuss [facility] services. Phone not in service. Telephone call made to daughter Megan [name] at [phone] and [phone] without any answer. HIPAA compliant voice mail messages left on both lines to request return call.   Will attempt outreach again at later time.   Megan Rolling, MSN-Ed, RN,BSN [place] Acute Care Coordinator [phone] [facility]) [phone]  (Toll free office)

## 2020-07-10 NOTE — Patient Outreach (Signed)
[facility] Post- Acute Care Coordinator follow up. Mrs. Fryman returned call to discuss [place] Management services and Remote Health follow up.   See notes from 07/11/20   Marthenia Rolling, [place], RN,BSN [place] Acute Care Coordinator [phone] [address] [facility]) [phone]  (Toll free office)

## 2020-07-11 ENCOUNTER — Telehealth: Payer: Self-pay | Admitting: Internal Medicine

## 2020-07-11 ENCOUNTER — Other Ambulatory Visit: Payer: Self-pay | Admitting: *Deleted

## 2020-07-11 DIAGNOSIS — J441 Chronic obstructive pulmonary disease with (acute) exacerbation: Secondary | ICD-10-CM

## 2020-07-11 NOTE — Telephone Encounter (Signed)
Follow up message  Patient states pharmacy has Fioricet, however the cost is over $300 Patient requesting call

## 2020-07-11 NOTE — Patient Outreach (Signed)
[facility] Post- Acute Care Coordinator follow up. Member screened for potential [facility] Care Management needs as a benefit of [place] Medicare.  Telephone call received from Mrs. Ken [phone]. Patient identifiers confirmed. Mrs. Stansell endorses she lives with her daughter and small grandchildren. States her daughter [name] is pregnant and is about to deliver very soon.   Explained [place] Management services. Mrs. Belitz is agreeable. States she can also benefit from transportation assistance. Explained Remote Health follow up for home visits due to having multiple co-morbidities. Mrs. Moya is agreeable.  Mrs. Bahena states she is temporarily using the number [phone] until her phone gets turned back on. Writer previously left messages on Casey's cell phone which is how Mrs. Lorton received writer's messages.   Mrs. Cerro states she has [facility] but has not heard from them yet. [facility] number provided to Mrs. Malena to contact.   Will make referral to [place] for care coordination. [facility] LCSW for transportation, and Remote [facility] for home visits.   Mrs. Ascencio has recent dx of COVID-19, COPD, HTN, CHF, MI, CAP, anxiety, depression, GERD.   Marthenia Rolling, MSN-Ed, RN,BSN [place] Acute Care Coordinator [phone] [facility]) [phone]  (Toll free office)

## 2020-07-11 NOTE — Telephone Encounter (Signed)
Patient would like fioricet  Called in, understand she hasn't taken this in a while but it is the only thing that helps migraines  [place], [place] AT [place] Phone:  [phone]  Fax:  [phone]     Wants to stop headache, would like a few called in to stop the headache, maybe three or four

## 2020-07-14 ENCOUNTER — Telehealth: Payer: Self-pay | Admitting: Internal Medicine

## 2020-07-14 ENCOUNTER — Encounter: Payer: Self-pay | Admitting: *Deleted

## 2020-07-14 ENCOUNTER — Other Ambulatory Visit: Payer: Self-pay | Admitting: *Deleted

## 2020-07-14 ENCOUNTER — Other Ambulatory Visit: Payer: Self-pay

## 2020-07-14 DIAGNOSIS — Z9181 History of falling: Secondary | ICD-10-CM | POA: Diagnosis not present

## 2020-07-14 DIAGNOSIS — E119 Type 2 diabetes mellitus without complications: Secondary | ICD-10-CM | POA: Diagnosis not present

## 2020-07-14 DIAGNOSIS — Z9981 Dependence on supplemental oxygen: Secondary | ICD-10-CM | POA: Diagnosis not present

## 2020-07-14 DIAGNOSIS — J189 Pneumonia, unspecified organism: Secondary | ICD-10-CM | POA: Diagnosis not present

## 2020-07-14 DIAGNOSIS — G4700 Insomnia, unspecified: Secondary | ICD-10-CM | POA: Diagnosis not present

## 2020-07-14 DIAGNOSIS — D649 Anemia, unspecified: Secondary | ICD-10-CM | POA: Diagnosis not present

## 2020-07-14 DIAGNOSIS — F411 Generalized anxiety disorder: Secondary | ICD-10-CM | POA: Diagnosis not present

## 2020-07-14 DIAGNOSIS — K219 Gastro-esophageal reflux disease without esophagitis: Secondary | ICD-10-CM | POA: Diagnosis not present

## 2020-07-14 DIAGNOSIS — I509 Heart failure, unspecified: Secondary | ICD-10-CM | POA: Diagnosis not present

## 2020-07-14 DIAGNOSIS — F329 Major depressive disorder, single episode, unspecified: Secondary | ICD-10-CM | POA: Diagnosis not present

## 2020-07-14 DIAGNOSIS — J441 Chronic obstructive pulmonary disease with (acute) exacerbation: Secondary | ICD-10-CM | POA: Diagnosis not present

## 2020-07-14 DIAGNOSIS — E43 Unspecified severe protein-calorie malnutrition: Secondary | ICD-10-CM | POA: Diagnosis not present

## 2020-07-14 DIAGNOSIS — J440 Chronic obstructive pulmonary disease with acute lower respiratory infection: Secondary | ICD-10-CM | POA: Diagnosis not present

## 2020-07-14 DIAGNOSIS — I110 Hypertensive heart disease with heart failure: Secondary | ICD-10-CM | POA: Diagnosis not present

## 2020-07-14 DIAGNOSIS — M6259 Muscle wasting and atrophy, not elsewhere classified, multiple sites: Secondary | ICD-10-CM | POA: Diagnosis not present

## 2020-07-14 DIAGNOSIS — U071 COVID-19: Secondary | ICD-10-CM | POA: Diagnosis not present

## 2020-07-14 NOTE — Patient Outreach (Signed)
[facility] Care Management  07/14/2020  Raegan Winders 06/27/1951 [id number]   New referral:  SNF discharge on 07/08/2020 Referral states patient needs assessment for chronic care and transportation.  Placed call to patient with no answer.   PLAN: no answer on first outreach attempt.Will mail outreach letter and attempt again in 3-43 business days.   Tomasa Rand, RN, BSN, CEN [facility] [employer] [phone]

## 2020-07-14 NOTE — Telephone Encounter (Signed)
Awaiting Dr. Jenny Reichmann to give verbal OK.

## 2020-07-14 NOTE — Telephone Encounter (Signed)
Ok to ask pt to check with her pharmacy regarding cost for similar rx for esgic

## 2020-07-14 NOTE — Telephone Encounter (Signed)
    Megan [name] from [facility] calling to request verbal orders for Nursing- 1week 4  PT/OT eval  Phone Moshe Salisbury [phone], ok to leave message

## 2020-07-14 NOTE — Telephone Encounter (Signed)
Sent to Dr. John. 

## 2020-07-14 NOTE — Patient Outreach (Signed)
[facility]) Care Management  07/14/2020  Anabelle Bungert 03-Aug-1951 [id number]   CSW made an initial attempt to try and contact patient today to perform the initial phone assessment, as well as assess and assist with social work needs and services, without success.  A HIPAA compliant message was left for patient on voicemail.  CSW is currently awaiting a return call.  CSW will make a second outreach attempt within the next 3-4 business days, if a return call is not received from patient in the meantime.  CSW will also mail a Patient Unsuccessful Outreach Letter to patient's home, requesting that she contact CSW directly if she is interested in receiving social work services through [place] with [facility].  Nat Christen, BSW, MSW, LCSW  Licensed Education officer, environmental Health System  [place]ailing [place] [address], [place], [place] [address] Physical Address-[address] [place], [place] [address] Toll Free Main # [phone] Fax # [phone] Cell # [phone]  [email or web address]

## 2020-07-14 NOTE — Telephone Encounter (Signed)
Ok verbALS

## 2020-07-15 DIAGNOSIS — E119 Type 2 diabetes mellitus without complications: Secondary | ICD-10-CM | POA: Diagnosis not present

## 2020-07-15 DIAGNOSIS — U071 COVID-19: Secondary | ICD-10-CM | POA: Diagnosis not present

## 2020-07-15 DIAGNOSIS — J189 Pneumonia, unspecified organism: Secondary | ICD-10-CM | POA: Diagnosis not present

## 2020-07-15 DIAGNOSIS — J440 Chronic obstructive pulmonary disease with acute lower respiratory infection: Secondary | ICD-10-CM | POA: Diagnosis not present

## 2020-07-15 DIAGNOSIS — J441 Chronic obstructive pulmonary disease with (acute) exacerbation: Secondary | ICD-10-CM | POA: Diagnosis not present

## 2020-07-15 DIAGNOSIS — E43 Unspecified severe protein-calorie malnutrition: Secondary | ICD-10-CM | POA: Diagnosis not present

## 2020-07-15 NOTE — Telephone Encounter (Signed)
Verbals given  

## 2020-07-16 ENCOUNTER — Ambulatory Visit: Payer: Medicare Other | Admitting: Internal Medicine

## 2020-07-16 ENCOUNTER — Ambulatory Visit: Payer: Medicare Other | Admitting: Emergency Medicine

## 2020-07-16 ENCOUNTER — Telehealth: Payer: Self-pay | Admitting: Internal Medicine

## 2020-07-16 DIAGNOSIS — J449 Chronic obstructive pulmonary disease, unspecified: Secondary | ICD-10-CM

## 2020-07-16 DIAGNOSIS — U071 COVID-19: Secondary | ICD-10-CM | POA: Diagnosis not present

## 2020-07-16 DIAGNOSIS — R269 Unspecified abnormalities of gait and mobility: Secondary | ICD-10-CM

## 2020-07-16 DIAGNOSIS — J440 Chronic obstructive pulmonary disease with acute lower respiratory infection: Secondary | ICD-10-CM | POA: Diagnosis not present

## 2020-07-16 DIAGNOSIS — E119 Type 2 diabetes mellitus without complications: Secondary | ICD-10-CM | POA: Diagnosis not present

## 2020-07-16 DIAGNOSIS — M545 Low back pain, unspecified: Secondary | ICD-10-CM

## 2020-07-16 DIAGNOSIS — J189 Pneumonia, unspecified organism: Secondary | ICD-10-CM | POA: Diagnosis not present

## 2020-07-16 DIAGNOSIS — J441 Chronic obstructive pulmonary disease with (acute) exacerbation: Secondary | ICD-10-CM | POA: Diagnosis not present

## 2020-07-16 DIAGNOSIS — E43 Unspecified severe protein-calorie malnutrition: Secondary | ICD-10-CM | POA: Diagnosis not present

## 2020-07-16 NOTE — Telephone Encounter (Signed)
    Anda Kraft from [place] requesting order for home PT 1X week for 9 weeks Phone [phone] ok to leave message   Patient also requesting order for DME  What type of DME ([place]) would like your provider to order? ROLLING WALKER Who would like to get the DME from? n/a Last visit with PCP (>3 months requires and appointment for insurance to cover the cost = please schedule patient for visit to discuss medical necessity for DME)? 1/6, patient states she will schedule appointment soon. She has an issue right now with transportation

## 2020-07-17 ENCOUNTER — Telehealth: Payer: Self-pay

## 2020-07-17 ENCOUNTER — Other Ambulatory Visit: Payer: Self-pay

## 2020-07-17 MED ORDER — LIDOCAINE 5 % EX PTCH
1.0000 | MEDICATED_PATCH | CUTANEOUS | 1 refills | Status: DC
Start: 2020-07-17 — End: 2020-11-14

## 2020-07-17 MED ORDER — LIDOCAINE 5 % EX PTCH
1.0000 | MEDICATED_PATCH | CUTANEOUS | 1 refills | Status: DC
Start: 2020-07-17 — End: 2020-07-17

## 2020-07-17 NOTE — Telephone Encounter (Signed)
I refilled the lidocaine 5% instead of the 4% as this is the one I'm pretty sure is more likely covered by insurance

## 2020-07-17 NOTE — Telephone Encounter (Signed)
[place] for [employer] for walker - done  hardcopy

## 2020-07-18 ENCOUNTER — Other Ambulatory Visit: Payer: Self-pay | Admitting: *Deleted

## 2020-07-18 NOTE — Patient Outreach (Signed)
[facility]) Care Management  07/18/2020  Megan [name] 06-25-1951 [id number]   CSW made a second attempt to try and contact patient today to perform the phone assessment, as well as assess and assist with social work needs and services, without success.  A HIPAA compliant message was left for patient on voicemail.  CSW continues to await a return call.  CSW will make a third outreach attempt to patient within the next 3-4 business days, if a return call is not received from patient in the meantime.  CSW will encourage Tomasa Rand, patient's RNCM, also with [facility], to have patient contact CSW directly if she is able to make a successful outreach attempt to patient.  Nat Christen, BSW, MSW, LCSW  Licensed Education officer, environmental Health System  Mailing [place] [address] [place], [place] [address] Physical Address-[address] [place],  [address] Toll Free Main # [phone] Fax # [phone] Cell # [phone]  [email or web address]

## 2020-07-18 NOTE — Patient Outreach (Signed)
[facility]) Care Management  07/18/2020  Megan [name] 22-Mar-1951 [id number]   Telephone assessment:  Placed call to patient who reports she is doing well.  Reports she has her medications and is taking her medication as prescribed. Reports she has home health coming in for therapy. Reports remote heath is also actively helping her.  I explained [facility] program and referral source. She reports the only thing she needs is help with transportation.  I reviewed with patient that [facility] social worker) has been trying to reach her.  Patient reports her cell phone is now working again. I will send an inbasket message to [place] and let her know to call patient on her cell phone.  PLAN: no nursing needs identified at this time will close to nursing. Patient agreed successful outreach letter and to call me if her needs change.  Tomasa Rand, RN, BSN, CEN [facility] [employer] [phone]

## 2020-07-18 NOTE — Telephone Encounter (Signed)
Dr. Jenny Reichmann has already responded to message.

## 2020-07-21 ENCOUNTER — Ambulatory Visit: Payer: Self-pay | Admitting: *Deleted

## 2020-07-24 ENCOUNTER — Telehealth: Payer: Self-pay | Admitting: Internal Medicine

## 2020-07-24 ENCOUNTER — Other Ambulatory Visit: Payer: Self-pay | Admitting: *Deleted

## 2020-07-24 DIAGNOSIS — J440 Chronic obstructive pulmonary disease with acute lower respiratory infection: Secondary | ICD-10-CM | POA: Diagnosis not present

## 2020-07-24 DIAGNOSIS — J189 Pneumonia, unspecified organism: Secondary | ICD-10-CM | POA: Diagnosis not present

## 2020-07-24 DIAGNOSIS — E43 Unspecified severe protein-calorie malnutrition: Secondary | ICD-10-CM | POA: Diagnosis not present

## 2020-07-24 DIAGNOSIS — E119 Type 2 diabetes mellitus without complications: Secondary | ICD-10-CM | POA: Diagnosis not present

## 2020-07-24 DIAGNOSIS — U071 COVID-19: Secondary | ICD-10-CM | POA: Diagnosis not present

## 2020-07-24 DIAGNOSIS — J441 Chronic obstructive pulmonary disease with (acute) exacerbation: Secondary | ICD-10-CM | POA: Diagnosis not present

## 2020-07-24 NOTE — Telephone Encounter (Signed)
Kellie with [facility] called and was expressing some concerns regarding the patients medication. It is the Fast Max from [facility] and she told the pt to stop taking the meds for the time being do to dizziness.    Lambert Keto: [phone]

## 2020-07-24 NOTE — Telephone Encounter (Signed)
   Patient wants to know if diltiazem (CARDIZEM CD) 120 MG 24 hr capsule will cause dizziness Patient requesting to take Metoprolol.  Patient also wants to know if prior auth is needed for lidocaine (LIDODERM) 5 %

## 2020-07-24 NOTE — Patient Outreach (Signed)
[place] [facility]) Care Management  07/24/2020  Chakia Counts 01-18-1951 [id number]    CSW made a third attempt to try and contact patient today to perform the initial phone assessment, as well as assess and assist with social work needs and services, without success.  A HIPAA compliant message was left for patient on voicemail.  CSW is currently awaiting a return call.  CSW will make a fourth and final outreach attempt within the next three weeks, if a return call is not received from patient in the meantime.  CSW will also route this note to patient's Primary Care Physician, Dr. Cathlean Cower, to ensure that he is aware of CSW's attempts at involvement with patient's plan of care.    Nat Christen, BSW, MSW, LCSW  Licensed Education officer, environmental Health System  Mailing [place] [address], [place], [place] [address] Physical Address-[address], [place], [place] [address] Toll Free Main # [phone] Fax # [phone] Cell # [phone]  [email or web address]

## 2020-07-25 NOTE — Telephone Encounter (Signed)
Sent to Dr. Jenny Reichmann.   **He has stated pt does not need PA for the Lidocaine 5%.

## 2020-07-25 NOTE — Telephone Encounter (Signed)
Sent to Dr. Jenny Reichmann to Advise.

## 2020-07-25 NOTE — Telephone Encounter (Signed)
PA has been sent in for Lidocaine 5%. Awaiting for decision.

## 2020-07-25 NOTE — Telephone Encounter (Signed)
      What type of DME ([place]) would like your provider to order? Patient is calling to request an order for standard walker, she does not want a rolling walker  Who would like to get the DME from? [facility]  Last visit with PCP (>3 months requires and appointment for insurance to cover the cost = please schedule patient for visit to discuss medical necessity for DME)? Appointment scheduled 10/7

## 2020-07-28 NOTE — Telephone Encounter (Signed)
Sent to Dr. John. 

## 2020-07-28 NOTE — Telephone Encounter (Signed)
No the cardizem should be ok and not causing the dizziness unless the bP is < 90-100, and/or the heart rate is < 50

## 2020-07-28 NOTE — Telephone Encounter (Signed)
Sorry I dont know what fast max from [facility] is  Please clarify

## 2020-07-29 DIAGNOSIS — U071 COVID-19: Secondary | ICD-10-CM | POA: Diagnosis not present

## 2020-07-29 DIAGNOSIS — J441 Chronic obstructive pulmonary disease with (acute) exacerbation: Secondary | ICD-10-CM | POA: Diagnosis not present

## 2020-07-29 DIAGNOSIS — E119 Type 2 diabetes mellitus without complications: Secondary | ICD-10-CM | POA: Diagnosis not present

## 2020-07-29 DIAGNOSIS — E43 Unspecified severe protein-calorie malnutrition: Secondary | ICD-10-CM | POA: Diagnosis not present

## 2020-07-29 DIAGNOSIS — J189 Pneumonia, unspecified organism: Secondary | ICD-10-CM | POA: Diagnosis not present

## 2020-07-29 DIAGNOSIS — J440 Chronic obstructive pulmonary disease with acute lower respiratory infection: Secondary | ICD-10-CM | POA: Diagnosis not present

## 2020-07-29 NOTE — Telephone Encounter (Signed)
Done hardcopy to [facility]

## 2020-07-29 NOTE — Addendum Note (Signed)
Addended by: [name] on: 07/29/2020 01:08 PM   Modules accepted: Orders

## 2020-07-30 NOTE — Telephone Encounter (Signed)
Tried reaching out to pt no answer and unable to lvm to ask for clarification of what Fast max from [facility] is.

## 2020-08-02 ENCOUNTER — Other Ambulatory Visit: Payer: Self-pay | Admitting: Internal Medicine

## 2020-08-05 DIAGNOSIS — Z23 Encounter for immunization: Secondary | ICD-10-CM | POA: Diagnosis not present

## 2020-08-06 ENCOUNTER — Other Ambulatory Visit: Payer: Self-pay | Admitting: *Deleted

## 2020-08-06 ENCOUNTER — Encounter: Payer: Self-pay | Admitting: *Deleted

## 2020-08-06 NOTE — Patient Outreach (Signed)
[facility]) Care Management  08/06/2020  Megan [name] Mar 31, 1951 [id number]   CSW received an incoming call from Mayra Reel, Nurse with Remote Health, also with [place] Management, indicating that she had an opportunity to meet with patient in the home and that patient confirms that she is no longer in need of transportation assistance.  Mrs. Harrell Gave reminded patient that she is able to utilize her Adult Medicaid benefit, through the [place], to receive [employer] to and from all physician appointments.  However, patient declined, reporting that she will get one of her family members to transport her before she will utilize public transportation.  Mrs. Harrell Gave inquired about any additional social work services or resources that CSW could provide to patient at present, but patient denied.  CSW encouraged Mrs. Harrell Gave to provide patient with CSW's contact information, encouraging patient to contact CSW directly if she changes her mind about needing transportation assistance, or if additional social work needs arise in the near future.  CSW will perform a case closure on patient, as patient declined social work services and/or resources at this time.  CSW will fax an update to patient's Primary Care Physician, Dr. Cathlean Cower to ensure that he is aware of CSW's attempt at involvement with patient's plan of care, as well as route a Physician Case Closure Letter.    Nat Christen, BSW, MSW, LCSW  Licensed Education officer, environmental Health System  Mailing [place] [address] [place], [place] [address] Physical Address-[address] [place],  [address] Toll Free Main # [phone] Fax # [phone] Cell # [phone]  [email or web address]

## 2020-08-07 ENCOUNTER — Encounter: Payer: Self-pay | Admitting: Internal Medicine

## 2020-08-07 ENCOUNTER — Other Ambulatory Visit: Payer: Self-pay

## 2020-08-07 ENCOUNTER — Ambulatory Visit (INDEPENDENT_AMBULATORY_CARE_PROVIDER_SITE_OTHER): Payer: Medicare Other

## 2020-08-07 ENCOUNTER — Ambulatory Visit (INDEPENDENT_AMBULATORY_CARE_PROVIDER_SITE_OTHER): Payer: Medicare Other | Admitting: Internal Medicine

## 2020-08-07 VITALS — BP 150/86 | HR 97 | Temp 98.7°F | Ht 63.0 in

## 2020-08-07 DIAGNOSIS — I10 Essential (primary) hypertension: Secondary | ICD-10-CM

## 2020-08-07 DIAGNOSIS — M545 Low back pain, unspecified: Secondary | ICD-10-CM | POA: Diagnosis not present

## 2020-08-07 DIAGNOSIS — E538 Deficiency of other specified B group vitamins: Secondary | ICD-10-CM

## 2020-08-07 DIAGNOSIS — J9612 Chronic respiratory failure with hypercapnia: Secondary | ICD-10-CM | POA: Diagnosis not present

## 2020-08-07 DIAGNOSIS — I872 Venous insufficiency (chronic) (peripheral): Secondary | ICD-10-CM | POA: Diagnosis not present

## 2020-08-07 DIAGNOSIS — J441 Chronic obstructive pulmonary disease with (acute) exacerbation: Secondary | ICD-10-CM | POA: Diagnosis not present

## 2020-08-07 DIAGNOSIS — R739 Hyperglycemia, unspecified: Secondary | ICD-10-CM | POA: Diagnosis not present

## 2020-08-07 DIAGNOSIS — J9611 Chronic respiratory failure with hypoxia: Secondary | ICD-10-CM | POA: Diagnosis not present

## 2020-08-07 DIAGNOSIS — J189 Pneumonia, unspecified organism: Secondary | ICD-10-CM | POA: Diagnosis not present

## 2020-08-07 DIAGNOSIS — J069 Acute upper respiratory infection, unspecified: Secondary | ICD-10-CM

## 2020-08-07 DIAGNOSIS — M546 Pain in thoracic spine: Secondary | ICD-10-CM

## 2020-08-07 DIAGNOSIS — J440 Chronic obstructive pulmonary disease with acute lower respiratory infection: Secondary | ICD-10-CM | POA: Diagnosis not present

## 2020-08-07 DIAGNOSIS — Z23 Encounter for immunization: Secondary | ICD-10-CM | POA: Diagnosis not present

## 2020-08-07 DIAGNOSIS — D649 Anemia, unspecified: Secondary | ICD-10-CM | POA: Diagnosis not present

## 2020-08-07 DIAGNOSIS — E119 Type 2 diabetes mellitus without complications: Secondary | ICD-10-CM | POA: Diagnosis not present

## 2020-08-07 DIAGNOSIS — E559 Vitamin D deficiency, unspecified: Secondary | ICD-10-CM

## 2020-08-07 DIAGNOSIS — E43 Unspecified severe protein-calorie malnutrition: Secondary | ICD-10-CM | POA: Diagnosis not present

## 2020-08-07 DIAGNOSIS — Z8616 Personal history of COVID-19: Secondary | ICD-10-CM

## 2020-08-07 DIAGNOSIS — J449 Chronic obstructive pulmonary disease, unspecified: Secondary | ICD-10-CM

## 2020-08-07 DIAGNOSIS — U071 COVID-19: Secondary | ICD-10-CM | POA: Diagnosis not present

## 2020-08-07 DIAGNOSIS — F419 Anxiety disorder, unspecified: Secondary | ICD-10-CM

## 2020-08-07 LAB — LIPID PANEL
Cholesterol: 244 mg/dL — ABNORMAL HIGH (ref 0–200)
HDL: 97.3 mg/dL (ref >39.00)
LDL Cholesterol: 125 mg/dL — ABNORMAL HIGH (ref 0–99)
NonHDL: 146.89
Total CHOL/HDL Ratio: 3
Triglycerides: 107 mg/dL (ref 0.0–149.0)
VLDL: 21.4 mg/dL (ref 0.0–40.0)

## 2020-08-07 LAB — URINALYSIS, ROUTINE W REFLEX MICROSCOPIC
Bilirubin Urine: NEGATIVE
Hgb urine dipstick: NEGATIVE
Ketones, ur: NEGATIVE
Leukocytes,Ua: NEGATIVE
Nitrite: NEGATIVE
Specific Gravity, Urine: 1.015 (ref 1.000–1.030)
Total Protein, Urine: NEGATIVE
Urine Glucose: NEGATIVE
Urobilinogen, UA: 0.2 (ref 0.0–1.0)
pH: 7.5 (ref 5.0–8.0)

## 2020-08-07 LAB — CBC WITH DIFFERENTIAL/PLATELET
Basophils Absolute: 0 10*3/uL (ref 0.0–0.1)
Basophils Relative: 0.1 % (ref 0.0–3.0)
Eosinophils Absolute: 0 10*3/uL (ref 0.0–0.7)
Eosinophils Relative: 0.2 % (ref 0.0–5.0)
HCT: 35.4 % — ABNORMAL LOW (ref 36.0–46.0)
Hemoglobin: 11.3 g/dL — ABNORMAL LOW (ref 12.0–15.0)
Lymphocytes Relative: 4.5 % — ABNORMAL LOW (ref 12.0–46.0)
Lymphs Abs: 0.5 10*3/uL — ABNORMAL LOW (ref 0.7–4.0)
MCHC: 32 g/dL (ref 30.0–36.0)
MCV: 93.4 fl (ref 78.0–100.0)
Monocytes Absolute: 0.5 10*3/uL (ref 0.1–1.0)
Monocytes Relative: 4.8 % (ref 3.0–12.0)
Neutro Abs: 10.1 10*3/uL — ABNORMAL HIGH (ref 1.4–7.7)
Neutrophils Relative %: 90.4 % — ABNORMAL HIGH (ref 43.0–77.0)
Platelets: 422 10*3/uL — ABNORMAL HIGH (ref 150.0–400.0)
RBC: 3.79 Mil/uL — ABNORMAL LOW (ref 3.87–5.11)
RDW: 14.8 % (ref 11.5–15.5)
WBC: 11.2 10*3/uL — ABNORMAL HIGH (ref 4.0–10.5)

## 2020-08-07 LAB — HEPATIC FUNCTION PANEL
ALT: 17 U/L (ref 0–35)
AST: 22 U/L (ref 0–37)
Albumin: 4 g/dL (ref 3.5–5.2)
Alkaline Phosphatase: 64 U/L (ref 39–117)
Bilirubin, Direct: 0 mg/dL (ref 0.0–0.3)
Total Bilirubin: 0.4 mg/dL (ref 0.2–1.2)
Total Protein: 6.8 g/dL (ref 6.0–8.3)

## 2020-08-07 LAB — TSH: TSH: 1.35 u[IU]/mL (ref 0.35–4.50)

## 2020-08-07 LAB — VITAMIN B12: Vitamin B-12: 477 pg/mL (ref 211–911)

## 2020-08-07 LAB — IBC PANEL
Iron: 29 ug/dL — ABNORMAL LOW (ref 42–145)
Saturation Ratios: 6.2 % — ABNORMAL LOW (ref 20.0–50.0)
Transferrin: 332 mg/dL (ref 212.0–360.0)

## 2020-08-07 LAB — FERRITIN: Ferritin: 28.6 ng/mL (ref 10.0–291.0)

## 2020-08-07 LAB — HEMOGLOBIN A1C: Hgb A1c MFr Bld: 6.9 % — ABNORMAL HIGH (ref 4.6–6.5)

## 2020-08-07 LAB — VITAMIN D 25 HYDROXY (VIT D DEFICIENCY, FRACTURES): VITD: 30.35 ng/mL (ref 30.00–100.00)

## 2020-08-07 MED ORDER — IPRATROPIUM-ALBUTEROL 0.5-2.5 (3) MG/3ML IN SOLN
3.0000 mL | Freq: Four times a day (QID) | RESPIRATORY_TRACT | 3 refills | Status: DC | PRN
Start: 2020-08-07 — End: 2020-11-14

## 2020-08-07 MED ORDER — HYDROCHLOROTHIAZIDE 12.5 MG PO CAPS: 12.5000 mg | ORAL_CAPSULE | Freq: Every day | ORAL | 3 refills | Status: DC

## 2020-08-07 MED ORDER — ALBUTEROL SULFATE HFA 108 (90 BASE) MCG/ACT IN AERS
INHALATION_SPRAY | RESPIRATORY_TRACT | 11 refills | Status: DC
Start: 2020-08-07 — End: 2020-11-14

## 2020-08-07 NOTE — Patient Instructions (Addendum)
You had the flu shot today  Please take OTC Vitamin D3 at 2000 units per day, indefinitely.  Ok to try the [employer] lidocaine patch as needed for the back pain  Please take all new medication as prescribed - the HCT fluid pill for the Blood Pressure and swelling in the feet  Please continue all other medications as before, and refills have been done if requested.  Please have the pharmacy call with any other refills you may need.  Please continue your efforts at being more active, low cholesterol diet, and weight control.  Please keep your appointments with your specialists as you may have planned  Please go to the XRAY Department in the first floor for the x-ray testing  Please go to the LAB at the blood drawing area for the tests to be done  You will be contacted by phone if any changes need to be made immediately.  Otherwise, you will receive a letter about your results with an explanation, but please check with MyChart first.  Please remember to sign up for MyChart if you have not done so, as this will be important to you in the future with finding out test results, communicating by private email, and scheduling acute appointments online when needed.  Please make an Appointment to return in 3 months

## 2020-08-07 NOTE — Progress Notes (Signed)
Subjective:    Patient ID: Megan [name], female    DOB: 1951-10-18, 69 y.o.   MRN: [id number]  HPI   Here to f/u - Was recently hospd 86/10 - 8/18.  Did not f/u here at wks for: PCP Please obtain BMP/CBC, 2 view CXR in 1week,  (see Discharge instructions)   Also did not see GI as reqeusted and declines now. On home o2 2L [place] continous, on 2.5L today with "trying to get around"  Has bilat pedal edema left > right that seems to go down at night , o/w Pt denies chest pain, increased sob or doe, wheezing, orthopnea, PND, increased LE swelling, palpitations, dizziness or syncope.  Pt denies new neurological symptoms such as new headache, or facial or extremity weakness or numbness   Pt denies polydipsia, polyuria, or low sugar symptoms such as weakness or confusion improved with po intake.  Pt states overall good compliance with meds, trying to follow lower cholesterol, diabetic diet, wt overall stable but little exercise however.    Pt continues to have recurring LBP without change in severity, bowel or bladder change, fever, wt loss,  worsening LE pain/numbness/weakness, gait change or falls. Past Medical History:  Diagnosis Date  . Anemia   . Anxiety state 08/20/2015  . CAP (community acquired pneumonia)   . CHF (congestive heart failure) ([place])   . COPD (chronic obstructive pulmonary disease) ([place])   . Depression   . Essential hypertension 08/19/2015  . GERD (gastroesophageal reflux disease)   . Headache   . History of hiatal hernia   . Myocardial infarction ([place])   . Shortness of breath dyspnea    Past Surgical History:  Procedure Laterality Date  . CARDIAC CATHETERIZATION    . CARDIAC CATHETERIZATION N/A 08/26/2015   Procedure: Left Heart Cath and Coronary Angiography;  Surgeon: Jettie Booze, MD;  Location: [facility];  Service: Cardiovascular;  Laterality: N/A;  . ESOPHAGOGASTRODUODENOSCOPY N/A 04/18/2018   Procedure: ESOPHAGOGASTRODUODENOSCOPY (EGD);  Surgeon: Ladene Artist, MD;  Location[facility];  Service: Endoscopy;  Laterality: N/A;  . tracheostomy      reports that she quit smoking about 19 years ago. Her smoking use included cigarettes. She has a 30.00 pack-year smoking history. She has never used smokeless tobacco. She reports that she does not drink alcohol and does not use drugs. family history includes Cirrhosis in her mother; Depression in her mother; Heart disease in her father. Allergies  Allergen Reactions  . Penicillins Rash and Hives    Has patient had a PCN reaction causing immediate rash, facial/tongue/throat swelling, SOB or lightheadedness with hypotension: No Has patient had a PCN reaction causing severe rash involving mucus membranes or skin necrosis:NO Has patient had a PCN reaction that required hospitalization No Has patient had a PCN reaction occurring within the last 10 years:NO If all of the above answers are "NO", then may proceed with Cephalosporin use.  Marland Kitchen Morphine And Related Other (See Comments)    Made the patient incoherent and she "talked out her head"  . Cephalexin Rash and Other (See Comments)    Flushed, also   Current Outpatient Medications on File Prior to Visit  Medication Sig Dispense Refill  . aspirin EC 81 MG tablet Take 81 mg by mouth daily.    Marland Kitchen azelastine (ASTELIN) 0.1 % nasal spray Place 1 spray into both nostrils 2 (two) times daily. Use in each nostril as directed 30 mL 6  . Butalbital-APAP-Caffeine 50-325-40 MG capsule Take  1 capsule by mouth 2 (two) times daily as needed.    . clotrimazole (MYCELEX) 10 MG troche SMARTSIG:1 Lozenge(s) By Mouth 3 Times Daily    . diltiazem (CARDIZEM CD) 120 MG 24 hr capsule Take 1 capsule (120 mg total) by mouth daily. 30 capsule 0  . feeding supplement, ENSURE ENLIVE, (ENSURE ENLIVE) LIQD Take 237 mLs by mouth 4 (four) times daily. 237 mL 0  . fluticasone (FLONASE) 50 MCG/ACT nasal spray SHAKE LIQUID AND USE 2 SPRAYS IN EACH NOSTRIL DAILY (Patient taking differently:  Place 2 sprays into both nostrils daily. ) 16 g 5  . guaiFENesin (MUCINEX) 600 MG 12 hr tablet Take 2 tablets (1,200 mg total) by mouth 2 (two) times daily. 60 tablet 1  . lidocaine (LIDODERM) 5 % Place 1 patch onto the skin daily. Remove & Discard patch within 12 hours or asd 20 patch 1  . loratadine (CLARITIN) 10 MG tablet Take 1 tablet (10 mg total) by mouth daily. 90 tablet 3  . montelukast (SINGULAIR) 10 MG tablet Take 1 tablet (10 mg total) by mouth at bedtime. 30 tablet 11  . Multiple Vitamin (TAB-A-VITE/BETA CAROTENE) TABS Take 1 tablet by mouth daily.    . ondansetron (ZOFRAN) 4 MG tablet Take 1 tablet (4 mg total) by mouth every 8 (eight) hours as needed for nausea or vomiting. 20 tablet 0  . pantoprazole (PROTONIX) 40 MG tablet TAKE 1 TABLET(40 MG) BY MOUTH DAILY (Patient taking differently: Take 40 mg by mouth daily. ) 90 tablet 1  . polyethylene glycol (MIRALAX / GLYCOLAX) packet Take 17 g by mouth 2 (two) times daily. (Patient taking differently: Take 17 g by mouth daily as needed for mild constipation. ) 14 each 0  . predniSONE (DELTASONE) 20 MG tablet TAKE 1 TABLET BY MOUTH EVERY DAY WITH BREAKFAST (Patient taking differently: Take 20 mg by mouth daily with breakfast. ) 30 tablet 1  . senna-docusate (SENOKOT-S) 8.6-50 MG tablet Take 1 tablet by mouth at bedtime as needed for mild constipation. 20 tablet 0  . tiZANidine (ZANAFLEX) 2 MG tablet Take 1 tablet (2 mg total) by mouth every 6 (six) hours as needed for muscle spasms. 60 tablet 3  . TRELEGY ELLIPTA 100-62.5-25 MCG/INH AEPB INHALE 1 PUFF INTO THE LUNGS DAILY 60 each 5   No current facility-administered medications on file prior to visit.   Review of Systems All otherwise neg per pt    Objective:   Physical Exam BP (!) 150/86 (BP Location: Left Arm, Patient Position: Sitting, Cuff Size: Large)   Pulse 97   Temp 98.7 F (37.1 C) (Oral)   Ht 5\' 3"  (1.6 m)   SpO2 99%   BMI 16.83 kg/m  VS noted, chronic ill, on  o2 Constitutional: Pt appears in NAD HENT: Head: NCAT.  Right Ear: External ear normal.  Left Ear: External ear normal.  Eyes: . Pupils are equal, round, and reactive to light. Conjunctivae and EOM are normal Nose: without d/c or deformity Neck: Neck supple. Gross normal ROM Cardiovascular: Normal rate and regular rhythm.   Pulmonary/Chest: Effort normal and breath sounds without rales or wheezing.  Neurological: Pt is alert. At baseline orientation, motor grossly intact Skin: Skin is warm. No rashes, other new lesions, trace pedal LE edema Psychiatric: Pt behavior is normal without agitation  All otherwise neg per pt  Lab Results  Component Value Date   WBC 14.6 (H) 06/15/2020   HGB 11.1 (L) 06/15/2020   HCT 36.9 06/15/2020  PLT 376 06/15/2020   GLUCOSE 93 06/16/2020   CHOL 241 (H) 04/19/2019   TRIG 193 (H) 06/10/2020   HDL 98.90 04/19/2019   LDLCALC 113 (H) 04/19/2019   ALT 15 06/15/2020   AST 15 06/15/2020   NA 137 06/16/2020   K 4.4 06/16/2020   CL 87 (L) 06/16/2020   CREATININE 0.75 06/16/2020   BUN 34 (H) 06/16/2020   CO2 44 (H) 06/16/2020   TSH 0.28 (L) 04/19/2019   INR 1.05 03/09/2017   HGBA1C 7.3 (H) 06/13/2020      Assessment & Plan:

## 2020-08-07 NOTE — Assessment & Plan Note (Addendum)
Ok for OTC lidocaine patch as well

## 2020-08-08 ENCOUNTER — Other Ambulatory Visit: Payer: Self-pay | Admitting: Internal Medicine

## 2020-08-08 DIAGNOSIS — F4312 Post-traumatic stress disorder, chronic: Secondary | ICD-10-CM

## 2020-08-08 NOTE — Telephone Encounter (Signed)
Done erx 

## 2020-08-10 ENCOUNTER — Encounter: Payer: Self-pay | Admitting: Internal Medicine

## 2020-08-10 DIAGNOSIS — E559 Vitamin D deficiency, unspecified: Secondary | ICD-10-CM | POA: Insufficient documentation

## 2020-08-10 NOTE — Assessment & Plan Note (Signed)
Stable,  to f/u any worsening symptoms or concerns

## 2020-08-10 NOTE — Assessment & Plan Note (Signed)
Ok for leg elevation, low salt, hct 12.5 qd prn

## 2020-08-10 NOTE — Assessment & Plan Note (Signed)
Stable, cont home o2

## 2020-08-10 NOTE — Assessment & Plan Note (Signed)
Mild uncontrolled, for hct 12.5 qd addition

## 2020-08-10 NOTE — Assessment & Plan Note (Signed)
C/w likley chronic undelrying djd djd lumbar - for films, otc pain patch,  to f/u any worsening symptoms or concerns

## 2020-08-10 NOTE — Assessment & Plan Note (Addendum)
Resolved, cont to follow  I spent 41 minutes in preparing to see the patient by review of recent labs, imaging and procedures, obtaining and reviewing separately obtained history, communicating with the patient and family or caregiver, ordering medications, tests or procedures, and documenting clinical information in the EHR including the differential Dx, treatment, and any further evaluation and other management of acute resp failure due to covid, chronic resp failure due to copd, copd, anxiety, venous insufficiency vit d def, thoracic and lumbar back pain htn, hyperglycemia, anemia

## 2020-08-10 NOTE — Assessment & Plan Note (Signed)
To start otc vit d 2000 qd

## 2020-08-10 NOTE — Assessment & Plan Note (Signed)
For lab, no overt bleeding

## 2020-08-10 NOTE — Assessment & Plan Note (Addendum)
stable overall by history and exam, recent data reviewed with pt, and pt to continue medical treatment as before,  to f/u any worsening symptoms or concerns, for labs as recommended

## 2020-08-13 ENCOUNTER — Telehealth: Payer: Self-pay | Admitting: Emergency Medicine

## 2020-08-13 DIAGNOSIS — M6259 Muscle wasting and atrophy, not elsewhere classified, multiple sites: Secondary | ICD-10-CM | POA: Diagnosis not present

## 2020-08-13 MED ORDER — PREDNISONE 20 MG PO TABS
20.0000 mg | ORAL_TABLET | Freq: Every day | ORAL | 2 refills | Status: DC
Start: 2020-08-13 — End: 2020-11-14

## 2020-08-13 NOTE — Telephone Encounter (Signed)
Lm for patient.  

## 2020-08-13 NOTE — Telephone Encounter (Signed)
Called and spoke with patient to let her know that refill for prednisone would be sent in for her. She verified preferred pharmacy. Refill sent in. Nothing further needed at this time.

## 2020-08-13 NOTE — Telephone Encounter (Signed)
Spoke with patient she is calling to get a refill on her prednisone because she is having trouble breathing. Patient states that she was in the hospital in August and was supposed to have a follow up but has not been able to come in for an appt because her daughter is her transportation and she just had a baby. Last OV was 03/18/20.  [facility]acy is [facility] on [address]  Durward Fortes patient to call us to make an appointment once she knows she has a ride. She expressed understanding.  Dr. Lamonte Sakai are you ok with me refilling Prednisone for patient

## 2020-08-13 NOTE — Telephone Encounter (Signed)
Ok to refill prednisone 20mg  qd.  Will need to work to get her in to be seen when her social situation will allow.

## 2020-08-14 ENCOUNTER — Ambulatory Visit: Payer: Self-pay | Admitting: *Deleted

## 2020-10-22 DIAGNOSIS — R059 Cough, unspecified: Secondary | ICD-10-CM | POA: Diagnosis not present

## 2020-11-06 ENCOUNTER — Encounter (HOSPITAL_COMMUNITY): Payer: Self-pay | Admitting: Emergency Medicine

## 2020-11-06 ENCOUNTER — Emergency Department (HOSPITAL_COMMUNITY): Payer: Medicare Other

## 2020-11-06 ENCOUNTER — Inpatient Hospital Stay (HOSPITAL_COMMUNITY)
Admission: EM | Admit: 2020-11-06 | Discharge: 2020-11-14 | DRG: 871 | Disposition: A | Discharge status: Hospice | Payer: Medicare Other | Attending: Infectious Disease | Admitting: Infectious Disease

## 2020-11-06 ENCOUNTER — Other Ambulatory Visit: Payer: Self-pay

## 2020-11-06 DIAGNOSIS — K5900 Constipation, unspecified: Secondary | ICD-10-CM | POA: Diagnosis present

## 2020-11-06 DIAGNOSIS — J690 Pneumonitis due to inhalation of food and vomit: Secondary | ICD-10-CM | POA: Diagnosis present

## 2020-11-06 DIAGNOSIS — J432 Centrilobular emphysema: Secondary | ICD-10-CM | POA: Diagnosis present

## 2020-11-06 DIAGNOSIS — I110 Hypertensive heart disease with heart failure: Secondary | ICD-10-CM | POA: Diagnosis present

## 2020-11-06 DIAGNOSIS — K219 Gastro-esophageal reflux disease without esophagitis: Secondary | ICD-10-CM | POA: Diagnosis present

## 2020-11-06 DIAGNOSIS — Z880 Allergy status to penicillin: Secondary | ICD-10-CM

## 2020-11-06 DIAGNOSIS — Z7951 Long term (current) use of inhaled steroids: Secondary | ICD-10-CM

## 2020-11-06 DIAGNOSIS — Z515 Encounter for palliative care: Secondary | ICD-10-CM

## 2020-11-06 DIAGNOSIS — R5381 Other malaise: Secondary | ICD-10-CM | POA: Diagnosis not present

## 2020-11-06 DIAGNOSIS — J449 Chronic obstructive pulmonary disease, unspecified: Secondary | ICD-10-CM | POA: Diagnosis not present

## 2020-11-06 DIAGNOSIS — A419 Sepsis, unspecified organism: Secondary | ICD-10-CM | POA: Diagnosis present

## 2020-11-06 DIAGNOSIS — Z681 Body mass index (BMI) 19 or less, adult: Secondary | ICD-10-CM

## 2020-11-06 DIAGNOSIS — Z8249 Family history of ischemic heart disease and other diseases of the circulatory system: Secondary | ICD-10-CM

## 2020-11-06 DIAGNOSIS — Z7952 Long term (current) use of systemic steroids: Secondary | ICD-10-CM

## 2020-11-06 DIAGNOSIS — R0600 Dyspnea, unspecified: Secondary | ICD-10-CM | POA: Diagnosis not present

## 2020-11-06 DIAGNOSIS — Z881 Allergy status to other antibiotic agents status: Secondary | ICD-10-CM

## 2020-11-06 DIAGNOSIS — Z66 Do not resuscitate: Secondary | ICD-10-CM | POA: Diagnosis not present

## 2020-11-06 DIAGNOSIS — R0902 Hypoxemia: Secondary | ICD-10-CM | POA: Diagnosis not present

## 2020-11-06 DIAGNOSIS — R000 Tachycardia, unspecified: Secondary | ICD-10-CM | POA: Diagnosis present

## 2020-11-06 DIAGNOSIS — Z7189 Other specified counseling: Secondary | ICD-10-CM | POA: Diagnosis not present

## 2020-11-06 DIAGNOSIS — D638 Anemia in other chronic diseases classified elsewhere: Secondary | ICD-10-CM | POA: Diagnosis present

## 2020-11-06 DIAGNOSIS — J9601 Acute respiratory failure with hypoxia: Secondary | ICD-10-CM | POA: Diagnosis present

## 2020-11-06 DIAGNOSIS — F4312 Post-traumatic stress disorder, chronic: Secondary | ICD-10-CM

## 2020-11-06 DIAGNOSIS — J9690 Respiratory failure, unspecified, unspecified whether with hypoxia or hypercapnia: Secondary | ICD-10-CM | POA: Diagnosis not present

## 2020-11-06 DIAGNOSIS — E876 Hypokalemia: Secondary | ICD-10-CM | POA: Diagnosis not present

## 2020-11-06 DIAGNOSIS — J439 Emphysema, unspecified: Secondary | ICD-10-CM | POA: Diagnosis not present

## 2020-11-06 DIAGNOSIS — J188 Other pneumonia, unspecified organism: Secondary | ICD-10-CM | POA: Diagnosis present

## 2020-11-06 DIAGNOSIS — Z8616 Personal history of COVID-19: Secondary | ICD-10-CM

## 2020-11-06 DIAGNOSIS — T380X5A Adverse effect of glucocorticoids and synthetic analogues, initial encounter: Secondary | ICD-10-CM | POA: Diagnosis present

## 2020-11-06 DIAGNOSIS — Z87891 Personal history of nicotine dependence: Secondary | ICD-10-CM | POA: Diagnosis not present

## 2020-11-06 DIAGNOSIS — J9622 Acute and chronic respiratory failure with hypercapnia: Secondary | ICD-10-CM | POA: Diagnosis present

## 2020-11-06 DIAGNOSIS — R739 Hyperglycemia, unspecified: Secondary | ICD-10-CM | POA: Diagnosis present

## 2020-11-06 DIAGNOSIS — F32A Depression, unspecified: Secondary | ICD-10-CM | POA: Diagnosis present

## 2020-11-06 DIAGNOSIS — R0602 Shortness of breath: Secondary | ICD-10-CM | POA: Diagnosis not present

## 2020-11-06 DIAGNOSIS — R1310 Dysphagia, unspecified: Secondary | ICD-10-CM | POA: Diagnosis not present

## 2020-11-06 DIAGNOSIS — Z885 Allergy status to narcotic agent status: Secondary | ICD-10-CM

## 2020-11-06 DIAGNOSIS — Z79899 Other long term (current) drug therapy: Secondary | ICD-10-CM

## 2020-11-06 DIAGNOSIS — J189 Pneumonia, unspecified organism: Secondary | ICD-10-CM | POA: Diagnosis not present

## 2020-11-06 DIAGNOSIS — E872 Acidosis: Secondary | ICD-10-CM | POA: Diagnosis present

## 2020-11-06 DIAGNOSIS — I252 Old myocardial infarction: Secondary | ICD-10-CM

## 2020-11-06 DIAGNOSIS — I10 Essential (primary) hypertension: Secondary | ICD-10-CM | POA: Diagnosis not present

## 2020-11-06 DIAGNOSIS — Z818 Family history of other mental and behavioral disorders: Secondary | ICD-10-CM

## 2020-11-06 DIAGNOSIS — I5032 Chronic diastolic (congestive) heart failure: Secondary | ICD-10-CM | POA: Diagnosis present

## 2020-11-06 DIAGNOSIS — E1165 Type 2 diabetes mellitus with hyperglycemia: Secondary | ICD-10-CM | POA: Diagnosis present

## 2020-11-06 DIAGNOSIS — R279 Unspecified lack of coordination: Secondary | ICD-10-CM | POA: Diagnosis not present

## 2020-11-06 DIAGNOSIS — J9621 Acute and chronic respiratory failure with hypoxia: Secondary | ICD-10-CM | POA: Diagnosis present

## 2020-11-06 DIAGNOSIS — E43 Unspecified severe protein-calorie malnutrition: Secondary | ICD-10-CM | POA: Diagnosis present

## 2020-11-06 DIAGNOSIS — R64 Cachexia: Secondary | ICD-10-CM | POA: Diagnosis present

## 2020-11-06 DIAGNOSIS — J80 Acute respiratory distress syndrome: Secondary | ICD-10-CM | POA: Diagnosis not present

## 2020-11-06 DIAGNOSIS — Z20822 Contact with and (suspected) exposure to covid-19: Secondary | ICD-10-CM | POA: Diagnosis present

## 2020-11-06 DIAGNOSIS — J850 Gangrene and necrosis of lung: Secondary | ICD-10-CM | POA: Diagnosis present

## 2020-11-06 DIAGNOSIS — I499 Cardiac arrhythmia, unspecified: Secondary | ICD-10-CM | POA: Diagnosis not present

## 2020-11-06 DIAGNOSIS — J181 Lobar pneumonia, unspecified organism: Secondary | ICD-10-CM | POA: Diagnosis not present

## 2020-11-06 DIAGNOSIS — Z8701 Personal history of pneumonia (recurrent): Secondary | ICD-10-CM | POA: Diagnosis not present

## 2020-11-06 DIAGNOSIS — J90 Pleural effusion, not elsewhere classified: Secondary | ICD-10-CM | POA: Diagnosis present

## 2020-11-06 DIAGNOSIS — Z7982 Long term (current) use of aspirin: Secondary | ICD-10-CM

## 2020-11-06 DIAGNOSIS — M6281 Muscle weakness (generalized): Secondary | ICD-10-CM | POA: Diagnosis not present

## 2020-11-06 DIAGNOSIS — I270 Primary pulmonary hypertension: Secondary | ICD-10-CM | POA: Diagnosis not present

## 2020-11-06 DIAGNOSIS — Z9981 Dependence on supplemental oxygen: Secondary | ICD-10-CM

## 2020-11-06 DIAGNOSIS — J441 Chronic obstructive pulmonary disease with (acute) exacerbation: Secondary | ICD-10-CM | POA: Diagnosis not present

## 2020-11-06 DIAGNOSIS — K224 Dyskinesia of esophagus: Secondary | ICD-10-CM | POA: Diagnosis present

## 2020-11-06 DIAGNOSIS — R1319 Other dysphagia: Secondary | ICD-10-CM | POA: Diagnosis not present

## 2020-11-06 DIAGNOSIS — J42 Unspecified chronic bronchitis: Secondary | ICD-10-CM | POA: Diagnosis not present

## 2020-11-06 DIAGNOSIS — E875 Hyperkalemia: Secondary | ICD-10-CM | POA: Diagnosis present

## 2020-11-06 DIAGNOSIS — F411 Generalized anxiety disorder: Secondary | ICD-10-CM | POA: Diagnosis present

## 2020-11-06 DIAGNOSIS — R0689 Other abnormalities of breathing: Secondary | ICD-10-CM | POA: Diagnosis not present

## 2020-11-06 LAB — COMPREHENSIVE METABOLIC PANEL
ALT: 9 U/L (ref 0–44)
AST: 12 U/L — ABNORMAL LOW (ref 15–41)
Albumin: 2.7 g/dL — ABNORMAL LOW (ref 3.5–5.0)
Alkaline Phosphatase: 128 U/L — ABNORMAL HIGH (ref 38–126)
Anion gap: 11 (ref 5–15)
BUN: 33 mg/dL — ABNORMAL HIGH (ref 8–23)
CO2: 44 mmol/L — ABNORMAL HIGH (ref 22–32)
Calcium: 9.7 mg/dL (ref 8.9–10.3)
Chloride: 80 mmol/L — ABNORMAL LOW (ref 98–111)
Creatinine, Ser: 1.21 mg/dL — ABNORMAL HIGH (ref 0.44–1.00)
GFR, Estimated: 49 mL/min — ABNORMAL LOW (ref >60)
Glucose, Bld: 147 mg/dL — ABNORMAL HIGH (ref 70–99)
Potassium: 5.4 mmol/L — ABNORMAL HIGH (ref 3.5–5.1)
Sodium: 135 mmol/L (ref 135–145)
Total Bilirubin: 0.9 mg/dL (ref 0.3–1.2)
Total Protein: 6.6 g/dL (ref 6.5–8.1)

## 2020-11-06 LAB — I-STAT VENOUS BLOOD GAS, ED
Acid-Base Excess: 21 mmol/L — ABNORMAL HIGH (ref 0.0–2.0)
Bicarbonate: 51.1 mmol/L — ABNORMAL HIGH (ref 20.0–28.0)
Calcium, Ion: 1.07 mmol/L — ABNORMAL LOW (ref 1.15–1.40)
HCT: 42 % (ref 36.0–46.0)
Hemoglobin: 14.3 g/dL (ref 12.0–15.0)
O2 Saturation: 55 %
Potassium: 5.2 mmol/L — ABNORMAL HIGH (ref 3.5–5.1)
Sodium: 131 mmol/L — ABNORMAL LOW (ref 135–145)
TCO2: >50 mmol/L — ABNORMAL HIGH (ref 22–32)
pCO2, Ven: 79.2 mmHg (ref 44.0–60.0)
pH, Ven: 7.418 (ref 7.250–7.430)
pO2, Ven: 31 mmHg — CL (ref 32.0–45.0)

## 2020-11-06 LAB — LACTIC ACID, PLASMA
Lactic Acid, Venous: 2 mmol/L (ref 0.5–1.9)
Lactic Acid, Venous: 2.3 mmol/L (ref 0.5–1.9)

## 2020-11-06 LAB — CBC WITH DIFFERENTIAL/PLATELET
Abs Immature Granulocytes: 0.39 10*3/uL — ABNORMAL HIGH (ref 0.00–0.07)
Basophils Absolute: 0.1 10*3/uL (ref 0.0–0.1)
Basophils Relative: 0 %
Eosinophils Absolute: 0 10*3/uL (ref 0.0–0.5)
Eosinophils Relative: 0 %
HCT: 36.2 % (ref 36.0–46.0)
Hemoglobin: 10.5 g/dL — ABNORMAL LOW (ref 12.0–15.0)
Immature Granulocytes: 1 %
Lymphocytes Relative: 4 %
Lymphs Abs: 1.2 10*3/uL (ref 0.7–4.0)
MCH: 30.3 pg (ref 26.0–34.0)
MCHC: 29 g/dL — ABNORMAL LOW (ref 30.0–36.0)
MCV: 104.6 fL — ABNORMAL HIGH (ref 80.0–100.0)
Monocytes Absolute: 2.2 10*3/uL — ABNORMAL HIGH (ref 0.1–1.0)
Monocytes Relative: 7 %
Neutro Abs: 26 10*3/uL — ABNORMAL HIGH (ref 1.7–7.7)
Neutrophils Relative %: 88 %
Platelets: 432 10*3/uL — ABNORMAL HIGH (ref 150–400)
RBC: 3.46 MIL/uL — ABNORMAL LOW (ref 3.87–5.11)
RDW: 13.8 % (ref 11.5–15.5)
WBC: 29.8 10*3/uL — ABNORMAL HIGH (ref 4.0–10.5)
nRBC: 0 % (ref 0.0–0.2)

## 2020-11-06 LAB — BRAIN NATRIURETIC PEPTIDE: B Natriuretic Peptide: 341.7 pg/mL — ABNORMAL HIGH (ref 0.0–100.0)

## 2020-11-06 LAB — PROTIME-INR
INR: 1.4 — ABNORMAL HIGH (ref 0.8–1.2)
Prothrombin Time: 17 seconds — ABNORMAL HIGH (ref 11.4–15.2)

## 2020-11-06 LAB — D-DIMER, QUANTITATIVE: D-Dimer, Quant: 1.74 ug/mL-FEU — ABNORMAL HIGH (ref 0.00–0.50)

## 2020-11-06 LAB — RESP PANEL BY RT-PCR (FLU A&B, COVID) ARPGX2
Influenza A by PCR: NEGATIVE
Influenza B by PCR: NEGATIVE
SARS Coronavirus 2 by RT PCR: NEGATIVE

## 2020-11-06 LAB — TROPONIN I (HIGH SENSITIVITY)
Troponin I (High Sensitivity): 36 ng/L — ABNORMAL HIGH (ref <18)
Troponin I (High Sensitivity): 35 ng/L — ABNORMAL HIGH (ref <18)

## 2020-11-06 LAB — SALICYLATE LEVEL: Salicylate Lvl: <7 mg/dL — ABNORMAL LOW (ref 7.0–30.0)

## 2020-11-06 LAB — ACETAMINOPHEN LEVEL: Acetaminophen (Tylenol), Serum: <10 ug/mL — ABNORMAL LOW (ref 10–30)

## 2020-11-06 MED ORDER — VANCOMYCIN HCL 750 MG/150ML IV SOLN
750.0000 mg | INTRAVENOUS | Status: DC
  Administered 2020-11-08: 750 mg via INTRAVENOUS
  Filled 2020-11-06: qty 150

## 2020-11-06 MED ORDER — VANCOMYCIN HCL IN DEXTROSE 1-5 GM/200ML-% IV SOLN
1000.0000 mg | Freq: Once | INTRAVENOUS | Status: AC
  Administered 2020-11-06: 1000 mg via INTRAVENOUS
  Filled 2020-11-06: qty 200

## 2020-11-06 MED ORDER — METHYLPREDNISOLONE SODIUM SUCC 125 MG IJ SOLR
125.0000 mg | Freq: Once | INTRAMUSCULAR | Status: AC
  Administered 2020-11-06: 125 mg via INTRAVENOUS
  Filled 2020-11-06: qty 2

## 2020-11-06 MED ORDER — ALBUTEROL (5 MG/ML) CONTINUOUS INHALATION SOLN
10.0000 mg/h | INHALATION_SOLUTION | Freq: Once | RESPIRATORY_TRACT | Status: AC
  Administered 2020-11-06: 10 mg/h via RESPIRATORY_TRACT
  Filled 2020-11-06: qty 40

## 2020-11-06 MED ORDER — LEVOFLOXACIN IN D5W 750 MG/150ML IV SOLN
750.0000 mg | Freq: Once | INTRAVENOUS | Status: AC
  Administered 2020-11-06: 750 mg via INTRAVENOUS
  Filled 2020-11-06: qty 150

## 2020-11-06 MED ORDER — SODIUM CHLORIDE 0.9 % IV BOLUS
1000.0000 mL | Freq: Once | INTRAVENOUS | Status: AC
  Administered 2020-11-06: 1000 mL via INTRAVENOUS

## 2020-11-06 NOTE — ED Notes (Signed)
[name], daughter, [phone] would like an update when available

## 2020-11-06 NOTE — Progress Notes (Signed)
Pharmacy Antibiotic Note  Megan [name] is a 70 y.o. female admitted on 11/06/2020 with pneumonia.  Pharmacy has been consulted for vancomycin dosing.  Scr 1.21, elevated from baseline (~0.7), estimated CrCl ~30 ml/min.   Patient has documented non-severe penicillin allergy, reported history of rash and flushed from cephalexin, has tolerated cefepime and ceftriaxone in the past.  Plan: Vancomycin IV 1000 mg load then 750 mg every 36 hours. Predicted AUC 539, goal 400-550, Scr 1.21. Monitor renal function, culture results, clinical improvement, de-escalation as appropriate  Height: 5\' 3"  (160 cm) Weight: 43.1 kg (95 lb 0.3 oz) IBW/kg (Calculated) : 52.4  Temp (24hrs), Avg:100.1 F (37.8 C), Min:100.1 F (37.8 C), Max:100.1 F (37.8 C)  Recent Labs  Lab 11/06/20 1932  CREATININE 1.21*    Estimated Creatinine Clearance: 30.7 mL/min (A) (by C-G formula based on SCr of 1.21 mg/dL (H)).    Allergies  Allergen Reactions  . Penicillins Rash and Hives    Has patient had a PCN reaction causing immediate rash, facial/tongue/throat swelling, SOB or lightheadedness with hypotension: No Has patient had a PCN reaction causing severe rash involving mucus membranes or skin necrosis:NO Has patient had a PCN reaction that required hospitalization No Has patient had a PCN reaction occurring within the last 10 years:NO If all of the above answers are "NO", then may proceed with Cephalosporin use.  Marland Kitchen Morphine And Related Other (See Comments)    Made the patient incoherent and she "talked out her head"  . Cephalexin Rash and Other (See Comments)    Flushed, also    Antimicrobials this admission: Levofloxacin x1 Vanc 1/6 >>    Dose adjustments this admission:  Microbiology results: 1/6 BCx: pending 1/6 MRSA PCR: pending  Thank you for allowing pharmacy to be a part of this patient's care.  Fara Olden, PharmD PGY-1 Pharmacy Resident 11/06/2020 8:38 PM Please see AMION for all pharmacy  numbers

## 2020-11-06 NOTE — ED Notes (Signed)
Multiple attempts made to get blood cultures with no success, phlebotomist notified.

## 2020-11-06 NOTE — ED Triage Notes (Signed)
Pt brought to ED by [facility] from home for c/o increase respiratory distress in the past 2 hours. 83% on 2.5 L when first responded arrive to her house, placed on NRM and didn't tolerated, pt then placed on CPAP and SPO2 up to 95%, BP 141/68, HR 140, R 20. 5 mg Albuterol, 0.5 Atrovent. IM 0.3 EPI given by EMS pta to ED.

## 2020-11-06 NOTE — ED Provider Notes (Signed)
[facility] EMERGENCY DEPARTMENT Provider Note   CSN: [id number] Arrival date & time: 11/06/20  1923     History Chief Complaint  Patient presents with  . Respiratory Distress    Estie Sproule is a 70 y.o. female.  The history is provided by the patient, medical records and the EMS personnel.   Miasia Crabtree is a 70 y.o. female who presents to the Emergency Department complaining of difficulty breathing.  Level V caveat due to respiratory distress.  Hx is provided by EMS.  Pt comes from home for increased sob that began just prior to EMS arrival.  Per report pt satting 83% on her home 2.5 L oxygen.  She was placed on NRB but did not tolerate and was transitioned to CPAP.  She received duoneb, 0.3mg  IM epi prior to ED arrival.    Additional information from daughter after patient's initial ED arrival.  Hurt her hip three weeks ago, been lying in bed due to significant hip pain, unable to ambulate well (normally walks without difficulty).  Started on tramadol and hydrocodone for pain and possibly tylenol and has been taking a lot of medication.  wouldn't get out of bed today.   Looks out of it, less responsive.  Has been coughing a lot for the last three days.  Chills.  Chest tightness two days ago.    Has been vaccinated for COVID, had COVID 19 8 months ago.      Past Medical History:  Diagnosis Date  . Anemia   . Anxiety state 08/20/2015  . CAP (community acquired pneumonia)   . CHF (congestive heart failure) ([place])   . COPD (chronic obstructive pulmonary disease) ([place])   . Depression   . Essential hypertension 08/19/2015  . GERD (gastroesophageal reflux disease)   . Headache   . History of hiatal hernia   . Myocardial infarction ([place])   . Shortness of breath dyspnea     Patient Active Problem List   Diagnosis Date Noted  . CAP (community acquired pneumonia) 11/06/2020  . Vitamin D deficiency 08/10/2020  . Venous insufficiency 08/07/2020  . Pressure injury  of skin 06/17/2020  . Acute respiratory disease due to COVID-19 virus 06/11/2020  . Nail abnormality 08/07/2019  . COPD exacerbation ([place]) 04/19/2019  . Anemia 04/19/2019  . Urinary frequency 09/22/2018  . Chills without fever 09/22/2018  . Thoracic back pain 07/25/2018  . Low back pain 07/25/2018  . Coccyx pain 07/25/2018  . Fever 05/03/2018  . Food impaction of esophagus   . Abnormal esophagram   . Abdominal pain 04/16/2018  . Epigastric pain 04/16/2018  . Chest pain 04/16/2018  . Chronic respiratory failure with hypoxia ([place]) 04/16/2018  . Dysphagia 04/16/2018  . Odynophagia 04/16/2018  . Allergic rhinitis 03/31/2018  . Malnutrition of moderate degree 02/27/2018  . 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 Palliative care by specialist   . Acute on Chronic respiratory failure with hypoxia and SEVERE Hypercapnia ([place]) 02/13/2018  . Acute pain of right shoulder 01/07/2018  . Flu-like symptoms 12/09/2017  . Fibromyalgia 09/16/2017  . Fatigue 09/16/2017  . Chronic respiratory failure with hypoxia and hypercapnia ([facility])   . Sepsis ([place])   . Hyperglycemia 01/05/2017  . Hair loss 06/22/2016  . Rash 06/22/2016  . Mixed headache 03/19/2016  . COPD (chronic obstructive pulmonary disease) ([place]) 03/19/2016  . Esophageal reflux 09/01/2015  . Goals of care, counseling/discussion   . Palliative care encounter   . Anxiety  08/20/2015  . Chronic post-traumatic stress disorder (PTSD) 08/20/2015  . Multiple pulmonary nodules determined by computed tomography of lung 08/19/2015  . Essential hypertension 08/19/2015  . Protein-calorie malnutrition, severe 08/19/2015    Past Surgical History:  Procedure Laterality Date  . CARDIAC CATHETERIZATION    . CARDIAC CATHETERIZATION N/A 08/26/2015   Procedure: Left Heart Cath and Coronary Angiography;  Surgeon: Jettie Booze, MD;  Location: [facility];  Service: Cardiovascular;  Laterality: N/A;   . ESOPHAGOGASTRODUODENOSCOPY N/A 04/18/2018   Procedure: ESOPHAGOGASTRODUODENOSCOPY (EGD);  Surgeon: Ladene Artist, MD;  Location: [facility] ENDOSCOPY;  Service: Endoscopy;  Laterality: N/A;  . tracheostomy       OB History   No obstetric history on file.     Family History  Problem Relation Age of Onset  . Cirrhosis Mother   . Depression Mother   . Heart disease Father     Social History   Tobacco Use  . Smoking status: Former Smoker    Packs/day: 1.00    Years: 30.00    Pack years: 30.00    Types: Cigarettes    Quit date: 08/18/2000    Years since quitting: 20.2  . Smokeless tobacco: Never Used  Substance Use Topics  . Alcohol use: No  . Drug use: No    Home Medications Prior to Admission medications   Medication Sig Start Date End Date Taking? Authorizing Provider  albuterol (VENTOLIN HFA) 108 (90 Base) MCG/ACT inhaler INHALE 2 PUFFS BY MOUTH EVERY 6 HOURS AS NEEDED FOR WHEEZING OR SHORTNESS OF BREATH 08/07/20   [name], MD  ALPRAZolam Duanne Moron) 0.25 MG tablet TAKE 1 TABLET BY MOUTH TWICE DAILY AS NEEDED 08/08/20   [name], MD  aspirin EC 81 MG tablet Take 81 mg by mouth daily.    [provider]  azelastine (ASTELIN) 0.1 % nasal spray Place 1 spray into both nostrils 2 (two) times daily. Use in each nostril as directed 06/12/19   Martyn Ehrich, NP  Butalbital-APAP-Caffeine [id number] MG capsule Take 1 capsule by mouth 2 (two) times daily as needed. 07/28/20   [provider]  clotrimazole (MYCELEX) 10 MG troche SMARTSIG:1 Lozenge(s) By Mouth 3 Times Daily 07/07/20   [provider]  diltiazem (CARDIZEM CD) 120 MG 24 hr capsule Take 1 capsule (120 mg total) by mouth daily. 06/18/20   Thurnell Lose, MD  feeding supplement, ENSURE ENLIVE, (ENSURE ENLIVE) LIQD Take 237 mLs by mouth 4 (four) times daily. 01/11/17   Eugenie Filler, MD  fluticasone (FLONASE) 50 MCG/ACT nasal spray SHAKE LIQUID AND USE 2 SPRAYS IN EACH NOSTRIL  DAILY Patient taking differently: Place 2 sprays into both nostrils daily.  07/25/19   Collene Gobble, MD  guaiFENesin (MUCINEX) 600 MG 12 hr tablet Take 2 tablets (1,200 mg total) by mouth 2 (two) times daily. 12/10/19   Martyn Ehrich, NP  hydrochlorothiazide (MICROZIDE) 12.5 MG capsule Take 1 capsule (12.5 mg total) by mouth daily. 08/07/20 08/07/21  [name], MD  ipratropium-albuterol (DUONEB) 0.5-2.5 (3) MG/3ML SOLN Take 3 mLs by nebulization every 6 (six) hours as needed. 08/07/20   [name], MD  lidocaine (LIDODERM) 5 % Place 1 patch onto the skin daily. Remove & Discard patch within 12 hours or asd 07/17/20   [name], MD  loratadine (CLARITIN) 10 MG tablet Take 1 tablet (10 mg total) by mouth daily. 11/14/19   [name], MD  montelukast (SINGULAIR) 10 MG  tablet Take 1 tablet (10 mg total) by mouth at bedtime. 09/22/18   Martyn Ehrich, NP  Multiple Vitamin (TAB-A-VITE/BETA CAROTENE) TABS Take 1 tablet by mouth daily. 01/01/14   [provider]  ondansetron (ZOFRAN) 4 MG tablet Take 1 tablet (4 mg total) by mouth every 8 (eight) hours as needed for nausea or vomiting. 06/17/20   Thurnell Lose, MD  pantoprazole (PROTONIX) 40 MG tablet TAKE 1 TABLET(40 MG) BY MOUTH DAILY Patient taking differently: Take 40 mg by mouth daily.  12/10/19   [name], MD  polyethylene glycol [facility] / Floria Raveling) packet Take 17 g by mouth 2 (two) times daily. Patient taking differently: Take 17 g by mouth daily as needed for mild constipation.  01/11/17   Eugenie Filler, MD  predniSONE (DELTASONE) 20 MG tablet Take 1 tablet (20 mg total) by mouth daily with breakfast. 08/13/20   Collene Gobble, MD  senna-docusate (SENOKOT-S) 8.6-50 MG tablet Take 1 tablet by mouth at bedtime as needed for mild constipation. 06/17/20   Thurnell Lose, MD  tiZANidine (ZANAFLEX) 2 MG tablet Take 1 tablet (2 mg total) by mouth every 6 (six) hours as needed for muscle spasms. 04/19/19   [name],  MD  TRELEGY ELLIPTA 100-62.5-25 MCG/INH AEPB INHALE 1 PUFF INTO THE LUNGS DAILY 02/26/20   Collene Gobble, MD    Allergies    Penicillins, Morphine and related, and Cephalexin  Review of Systems   Review of Systems  All other systems reviewed and are negative.   Physical Exam Updated Vital Signs BP (!) 101/55   Pulse (!) 131   Temp 100.1 F (37.8 C) (Temporal)   Resp (!) 24   Ht 5\' 3"  (1.6 m)   Wt 43.1 kg   SpO2 100%   BMI 16.83 kg/m   Physical Exam Vitals and nursing note reviewed.  Constitutional:      General: She is in acute distress.     Appearance: She is well-developed and well-nourished. She is ill-appearing.  HENT:     Head: Normocephalic and atraumatic.  Cardiovascular:     Rate and Rhythm: Regular rhythm. Tachycardia present.     Heart sounds: No murmur heard.   Pulmonary:     Effort: Respiratory distress present.     Comments: Decreased air movement bilaterally Abdominal:     Palpations: Abdomen is soft.     Tenderness: There is no abdominal tenderness. There is no guarding or rebound.  Musculoskeletal:        General: No tenderness or edema.  Skin:    General: Skin is warm and dry.  Neurological:     Mental Status: She is alert.     Comments: Alert, MAE symmetrically.    Psychiatric:        Mood and Affect: Mood and affect normal.        Behavior: Behavior normal.     ED Results / Procedures / Treatments   Labs (all labs ordered are listed, but only abnormal results are displayed) Labs Reviewed  COMPREHENSIVE METABOLIC PANEL - Abnormal; Notable for the following components:      Result Value   Potassium 5.4 (*)    Chloride 80 (*)    CO2 44 (*)    Glucose, Bld 147 (*)    BUN 33 (*)    Creatinine, Ser 1.21 (*)    Albumin 2.7 (*)    AST 12 (*)    Alkaline Phosphatase 128 (*)  GFR, Estimated 49 (*)    All other components within normal limits  BRAIN NATRIURETIC PEPTIDE - Abnormal; Notable for the following components:   B  Natriuretic Peptide 341.7 (*)    All other components within normal limits  LACTIC ACID, PLASMA - Abnormal; Notable for the following components:   Lactic Acid, Venous 2.0 (*)    All other components within normal limits  LACTIC ACID, PLASMA - Abnormal; Notable for the following components:   Lactic Acid, Venous 2.3 (*)    All other components within normal limits  ACETAMINOPHEN LEVEL - Abnormal; Notable for the following components:   Acetaminophen (Tylenol), Serum <10 (*)    All other components within normal limits  PROTIME-INR - Abnormal; Notable for the following components:   Prothrombin Time 17.0 (*)    INR 1.4 (*)    All other components within normal limits  D-DIMER, QUANTITATIVE (NOT AT [facility]) - Abnormal; Notable for the following components:   D-Dimer, Quant 1.74 (*)    All other components within normal limits  CBC WITH DIFFERENTIAL/PLATELET - Abnormal; Notable for the following components:   WBC 29.8 (*)    RBC 3.46 (*)    Hemoglobin 10.5 (*)    MCV 104.6 (*)    MCHC 29.0 (*)    Platelets 432 (*)    Neutro Abs 26.0 (*)    Monocytes Absolute 2.2 (*)    Abs Immature Granulocytes 0.39 (*)    All other components within normal limits  SALICYLATE LEVEL - Abnormal; Notable for the following components:   Salicylate Lvl <3.2 (*)    All other components within normal limits  I-STAT VENOUS BLOOD GAS, ED - Abnormal; Notable for the following components:   pCO2, Ven 79.2 (*)    pO2, Ven 31.0 (*)    Bicarbonate 51.1 (*)    TCO2 >50 (*)    Acid-Base Excess 21.0 (*)    Sodium 131 (*)    Potassium 5.2 (*)    Calcium, Ion 1.07 (*)    All other components within normal limits  TROPONIN I (HIGH SENSITIVITY) - Abnormal; Notable for the following components:   Troponin I (High Sensitivity) 36 (*)    All other components within normal limits  TROPONIN I (HIGH SENSITIVITY) - Abnormal; Notable for the following components:   Troponin I (High Sensitivity) 35 (*)    All other  components within normal limits  RESP PANEL BY RT-PCR (FLU A&B, COVID) ARPGX2  CULTURE, BLOOD (ROUTINE X 2)  CULTURE, BLOOD (ROUTINE X 2)  MRSA PCR SCREENING  CBC WITH DIFFERENTIAL/PLATELET  CBC WITH DIFFERENTIAL/PLATELET    EKG EKG Interpretation  Date/Time:  Thursday November 06 2020 19:39:38 EST Ventricular Rate:  139 PR Interval:    QRS Duration: 80 QT Interval:  263 QTC Calculation: 400 R Axis:   86 Text Interpretation: Sinus tachycardia Multiple premature complexes, vent & supraven Borderline right axis deviation RSR' in V1 or V2, probably normal variant Probable left ventricular hypertrophy Confirmed by Quintella Reichert [phone]) on 11/06/2020 9:34:49 PM   Radiology DG Chest Port 1 View  Result Date: 11/06/2020 CLINICAL DATA:  Short of breath, hypoxia, respiratory distress EXAM: PORTABLE CHEST 1 VIEW COMPARISON:  08/07/2020 FINDINGS: 2 frontal views of the chest demonstrate dense right basilar consolidation with small right pleural effusion. No pneumothorax. Background emphysema again noted. Cardiac silhouette is stable. No acute bony abnormalities. IMPRESSION: 1. Dense right lower lobe pneumonia with small right parapneumonic effusion. Electronically Signed   By: Randa Ngo  M.D.   On: 11/06/2020 20:10    Procedures Procedures (including critical care time) CRITICAL CARE Performed by: Quintella Reichert   Total critical care time: 35 minutes  Critical care time was exclusive of separately billable procedures and treating other patients.  Critical care was necessary to treat or prevent imminent or life-threatening deterioration.  Critical care was time spent personally by me on the following activities: development of treatment plan with patient and/or surrogate as well as nursing, discussions with consultants, evaluation of patient's response to treatment, examination of patient, obtaining history from patient or surrogate, ordering and performing treatments and interventions,  ordering and review of laboratory studies, ordering and review of radiographic studies, pulse oximetry and re-evaluation of patient's condition.  Medications Ordered in ED Medications  vancomycin (VANCOREADY) IVPB 750 mg/150 mL (has no administration in time range)  methylPREDNISolone sodium succinate (SOLU-MEDROL) 125 mg/2 mL injection 125 mg (125 mg Intravenous Given 11/06/20 2052)  albuterol (PROVENTIL,VENTOLIN) solution continuous neb (10 mg/hr Nebulization Given 11/06/20 2120)  levofloxacin (LEVAQUIN) IVPB 750 mg (0 mg Intravenous Stopped 11/06/20 2230)  sodium chloride 0.9 % bolus 1,000 mL (1,000 mLs Intravenous New Bag/Given 11/06/20 2050)  vancomycin (VANCOCIN) IVPB 1000 mg/200 mL premix (0 mg Intravenous Stopped 11/06/20 2230)    ED Course  I have reviewed the triage vital signs and the nursing notes.  Pertinent labs & imaging results that were available during my care of the patient were reviewed by me and considered in my medical decision making (see chart for details).    MDM Rules/Calculators/A&P                         patient presented to the emergency department on CPAP for respiratory distress, hypoxia on her home oxygen requirements. On ED presentation patient in severe distress and she was transition to BiPAP for respiratory support. She was treated with Solu-Medrol, continuous albuterol for possible COPD exacerbation. Chest x-ray concerning for right lower lobe pneumonia and she was started on broad-spectrum antibiotics for pneumonia. She was also treated with IV fluid hydration. During her ED stay her work of breathing did improve and she was able to be transitioned off of BiPAP. Patient and daughter updated of findings of studies in critical nature of illness and they are in agreement with admission for ongoing treatment. Medicine consulted for admission for ongoing treatment.  Final Clinical Impression(s) / ED Diagnoses Final diagnoses:  Community acquired pneumonia of right  lower lobe of lung    Rx / DC Orders ED Discharge Orders    None       Quintella Reichert, MD 11/07/20 0004

## 2020-11-07 ENCOUNTER — Ambulatory Visit: Payer: Medicare Other | Admitting: Internal Medicine

## 2020-11-07 ENCOUNTER — Encounter (HOSPITAL_COMMUNITY): Payer: Self-pay | Admitting: Internal Medicine

## 2020-11-07 ENCOUNTER — Inpatient Hospital Stay (HOSPITAL_COMMUNITY): Payer: Medicare Other

## 2020-11-07 DIAGNOSIS — A419 Sepsis, unspecified organism: Principal | ICD-10-CM

## 2020-11-07 DIAGNOSIS — J9601 Acute respiratory failure with hypoxia: Secondary | ICD-10-CM

## 2020-11-07 DIAGNOSIS — J441 Chronic obstructive pulmonary disease with (acute) exacerbation: Secondary | ICD-10-CM | POA: Diagnosis not present

## 2020-11-07 DIAGNOSIS — J189 Pneumonia, unspecified organism: Secondary | ICD-10-CM | POA: Diagnosis not present

## 2020-11-07 LAB — CBC WITH DIFFERENTIAL/PLATELET
Abs Immature Granulocytes: 0.26 10*3/uL — ABNORMAL HIGH (ref 0.00–0.07)
Basophils Absolute: 0.1 10*3/uL (ref 0.0–0.1)
Basophils Relative: 0 %
Eosinophils Absolute: 0 10*3/uL (ref 0.0–0.5)
Eosinophils Relative: 0 %
HCT: 33 % — ABNORMAL LOW (ref 36.0–46.0)
Hemoglobin: 10 g/dL — ABNORMAL LOW (ref 12.0–15.0)
Immature Granulocytes: 1 %
Lymphocytes Relative: 1 %
Lymphs Abs: 0.2 10*3/uL — ABNORMAL LOW (ref 0.7–4.0)
MCH: 30.1 pg (ref 26.0–34.0)
MCHC: 30.3 g/dL (ref 30.0–36.0)
MCV: 99.4 fL (ref 80.0–100.0)
Monocytes Absolute: 1.4 10*3/uL — ABNORMAL HIGH (ref 0.1–1.0)
Monocytes Relative: 4 %
Neutro Abs: 30 10*3/uL — ABNORMAL HIGH (ref 1.7–7.7)
Neutrophils Relative %: 94 %
Platelets: 488 10*3/uL — ABNORMAL HIGH (ref 150–400)
RBC: 3.32 MIL/uL — ABNORMAL LOW (ref 3.87–5.11)
RDW: 13.8 % (ref 11.5–15.5)
WBC: 31.9 10*3/uL — ABNORMAL HIGH (ref 4.0–10.5)
nRBC: 0 % (ref 0.0–0.2)

## 2020-11-07 LAB — CBG MONITORING, ED
Glucose-Capillary: 287 mg/dL — ABNORMAL HIGH (ref 70–99)
Glucose-Capillary: 208 mg/dL — ABNORMAL HIGH (ref 70–99)

## 2020-11-07 LAB — COMPREHENSIVE METABOLIC PANEL
ALT: 8 U/L (ref 0–44)
AST: 11 U/L — ABNORMAL LOW (ref 15–41)
Albumin: 2.1 g/dL — ABNORMAL LOW (ref 3.5–5.0)
Alkaline Phosphatase: 95 U/L (ref 38–126)
Anion gap: 12 (ref 5–15)
BUN: 34 mg/dL — ABNORMAL HIGH (ref 8–23)
CO2: 35 mmol/L — ABNORMAL HIGH (ref 22–32)
Calcium: 8.7 mg/dL — ABNORMAL LOW (ref 8.9–10.3)
Chloride: 87 mmol/L — ABNORMAL LOW (ref 98–111)
Creatinine, Ser: 0.95 mg/dL (ref 0.44–1.00)
GFR, Estimated: >60 mL/min (ref >60)
Glucose, Bld: 242 mg/dL — ABNORMAL HIGH (ref 70–99)
Potassium: 4.8 mmol/L (ref 3.5–5.1)
Sodium: 134 mmol/L — ABNORMAL LOW (ref 135–145)
Total Bilirubin: 0.5 mg/dL (ref 0.3–1.2)
Total Protein: 5.4 g/dL — ABNORMAL LOW (ref 6.5–8.1)

## 2020-11-07 LAB — LACTIC ACID, PLASMA: Lactic Acid, Venous: 1.6 mmol/L (ref 0.5–1.9)

## 2020-11-07 LAB — HEMOGLOBIN A1C
Hgb A1c MFr Bld: 6.7 % — ABNORMAL HIGH (ref 4.8–5.6)
Mean Plasma Glucose: 145.59 mg/dL

## 2020-11-07 LAB — PROCALCITONIN: Procalcitonin: 32.74 ng/mL

## 2020-11-07 LAB — GLUCOSE, CAPILLARY
Glucose-Capillary: 299 mg/dL — ABNORMAL HIGH (ref 70–99)
Glucose-Capillary: 103 mg/dL — ABNORMAL HIGH (ref 70–99)
Glucose-Capillary: 116 mg/dL — ABNORMAL HIGH (ref 70–99)

## 2020-11-07 LAB — MRSA PCR SCREENING: MRSA by PCR: POSITIVE — AB

## 2020-11-07 MED ORDER — LACTATED RINGERS IV SOLN: INTRAVENOUS | Status: AC

## 2020-11-07 MED ORDER — METOPROLOL TARTRATE 5 MG/5ML IV SOLN
2.5000 mg | Freq: Four times a day (QID) | INTRAVENOUS | Status: DC | PRN
  Administered 2020-11-07: 2.5 mg via INTRAVENOUS
  Filled 2020-11-07: qty 5

## 2020-11-07 MED ORDER — METRONIDAZOLE IN NACL 5-0.79 MG/ML-% IV SOLN
500.0000 mg | Freq: Three times a day (TID) | INTRAVENOUS | Status: DC
  Administered 2020-11-07 – 2020-11-11 (×13): 500 mg via INTRAVENOUS
  Filled 2020-11-07 (×13): qty 100

## 2020-11-07 MED ORDER — ENOXAPARIN SODIUM 40 MG/0.4ML ~~LOC~~ SOLN
40.0000 mg | SUBCUTANEOUS | Status: DC
  Administered 2020-11-07 – 2020-11-08 (×2): 40 mg via SUBCUTANEOUS
  Filled 2020-11-07 (×2): qty 0.4

## 2020-11-07 MED ORDER — INSULIN ASPART 100 UNIT/ML ~~LOC~~ SOLN
0.0000 [IU] | Freq: Three times a day (TID) | SUBCUTANEOUS | Status: DC
  Administered 2020-11-07: 3 [IU] via SUBCUTANEOUS
  Administered 2020-11-07 (×2): 5 [IU] via SUBCUTANEOUS
  Administered 2020-11-08: 1 [IU] via SUBCUTANEOUS
  Administered 2020-11-08: 7 [IU] via SUBCUTANEOUS
  Administered 2020-11-09: 1 [IU] via SUBCUTANEOUS
  Administered 2020-11-09: 3 [IU] via SUBCUTANEOUS
  Administered 2020-11-10: 5 [IU] via SUBCUTANEOUS
  Administered 2020-11-10: 2 [IU] via SUBCUTANEOUS
  Administered 2020-11-11 (×2): 3 [IU] via SUBCUTANEOUS
  Administered 2020-11-12: 1 [IU] via SUBCUTANEOUS

## 2020-11-07 MED ORDER — MUPIROCIN 2 % EX OINT
1.0000 "application " | TOPICAL_OINTMENT | Freq: Two times a day (BID) | CUTANEOUS | Status: AC
  Administered 2020-11-07 – 2020-11-12 (×10): 1 via NASAL
  Filled 2020-11-07 (×3): qty 22

## 2020-11-07 MED ORDER — LEVOFLOXACIN IN D5W 750 MG/150ML IV SOLN: 750.0000 mg | INTRAVENOUS | Status: DC

## 2020-11-07 MED ORDER — LEVALBUTEROL HCL 0.63 MG/3ML IN NEBU
0.6300 mg | INHALATION_SOLUTION | Freq: Four times a day (QID) | RESPIRATORY_TRACT | Status: DC
  Filled 2020-11-07: qty 3

## 2020-11-07 MED ORDER — ASPIRIN EC 81 MG PO TBEC
81.0000 mg | DELAYED_RELEASE_TABLET | Freq: Every day | ORAL | Status: DC
  Administered 2020-11-07 – 2020-11-12 (×6): 81 mg via ORAL
  Filled 2020-11-07 (×6): qty 1

## 2020-11-07 MED ORDER — POLYETHYLENE GLYCOL 3350 17 G PO PACK: 17.0000 g | PACK | Freq: Every day | ORAL | Status: DC | PRN

## 2020-11-07 MED ORDER — FLUTICASONE FUROATE-VILANTEROL 100-25 MCG/INH IN AEPB
1.0000 | INHALATION_SPRAY | Freq: Every day | RESPIRATORY_TRACT | Status: DC
  Administered 2020-11-08 – 2020-11-14 (×6): 1 via RESPIRATORY_TRACT
  Filled 2020-11-07 (×2): qty 28

## 2020-11-07 MED ORDER — METHYLPREDNISOLONE SODIUM SUCC 40 MG IJ SOLR
40.0000 mg | Freq: Two times a day (BID) | INTRAMUSCULAR | Status: DC
  Administered 2020-11-07: 40 mg via INTRAVENOUS
  Filled 2020-11-07: qty 1

## 2020-11-07 MED ORDER — ACETAMINOPHEN 650 MG RE SUPP: 650.0000 mg | Freq: Four times a day (QID) | RECTAL | Status: DC | PRN

## 2020-11-07 MED ORDER — ALPRAZOLAM 0.25 MG PO TABS
0.2500 mg | ORAL_TABLET | Freq: Two times a day (BID) | ORAL | Status: DC | PRN
  Administered 2020-11-07 – 2020-11-12 (×6): 0.25 mg via ORAL
  Filled 2020-11-07 (×6): qty 1

## 2020-11-07 MED ORDER — UMECLIDINIUM BROMIDE 62.5 MCG/INH IN AEPB
1.0000 | INHALATION_SPRAY | Freq: Every day | RESPIRATORY_TRACT | Status: DC
  Administered 2020-11-08 – 2020-11-14 (×6): 1 via RESPIRATORY_TRACT
  Filled 2020-11-07 (×3): qty 7

## 2020-11-07 MED ORDER — PANTOPRAZOLE SODIUM 40 MG PO TBEC
40.0000 mg | DELAYED_RELEASE_TABLET | Freq: Every day | ORAL | Status: DC
  Administered 2020-11-07 – 2020-11-12 (×6): 40 mg via ORAL
  Filled 2020-11-07 (×6): qty 1

## 2020-11-07 MED ORDER — CHLORHEXIDINE GLUCONATE CLOTH 2 % EX PADS
6.0000 | MEDICATED_PAD | Freq: Every day | CUTANEOUS | Status: AC
  Administered 2020-11-10 – 2020-11-12 (×3): 6 via TOPICAL

## 2020-11-07 MED ORDER — FLUTICASONE-UMECLIDIN-VILANT 100-62.5-25 MCG/INH IN AEPB: 1.0000 | INHALATION_SPRAY | Freq: Every day | RESPIRATORY_TRACT | Status: DC

## 2020-11-07 MED ORDER — PREDNISONE 20 MG PO TABS
40.0000 mg | ORAL_TABLET | Freq: Every day | ORAL | Status: AC
  Administered 2020-11-08 – 2020-11-11 (×4): 40 mg via ORAL
  Filled 2020-11-07 (×4): qty 2

## 2020-11-07 MED ORDER — ACETAMINOPHEN 325 MG PO TABS
650.0000 mg | ORAL_TABLET | Freq: Four times a day (QID) | ORAL | Status: DC | PRN
  Administered 2020-11-07 – 2020-11-11 (×6): 650 mg via ORAL
  Filled 2020-11-07 (×6): qty 2

## 2020-11-07 MED ORDER — LEVALBUTEROL HCL 0.63 MG/3ML IN NEBU
0.6300 mg | INHALATION_SOLUTION | Freq: Four times a day (QID) | RESPIRATORY_TRACT | Status: DC | PRN
  Administered 2020-11-11 – 2020-11-12 (×2): 0.63 mg via RESPIRATORY_TRACT
  Filled 2020-11-07 (×3): qty 3

## 2020-11-07 MED ORDER — LEVOFLOXACIN IN D5W 750 MG/150ML IV SOLN
750.0000 mg | INTRAVENOUS | Status: AC
  Administered 2020-11-08 – 2020-11-10 (×2): 750 mg via INTRAVENOUS
  Filled 2020-11-07 (×2): qty 150

## 2020-11-07 MED ORDER — ONDANSETRON HCL 4 MG/2ML IJ SOLN
4.0000 mg | Freq: Four times a day (QID) | INTRAMUSCULAR | Status: DC | PRN
  Administered 2020-11-07: 4 mg via INTRAVENOUS
  Filled 2020-11-07: qty 2

## 2020-11-07 MED ORDER — DILTIAZEM HCL ER COATED BEADS 120 MG PO CP24
120.0000 mg | ORAL_CAPSULE | Freq: Every day | ORAL | Status: DC
  Administered 2020-11-07 – 2020-11-12 (×6): 120 mg via ORAL
  Filled 2020-11-07 (×6): qty 1

## 2020-11-07 NOTE — Progress Notes (Signed)
Attempted echo. HR to high. Will check later in the afternoon and try again as schedule permits.

## 2020-11-07 NOTE — ED Notes (Signed)
Patient taken off bipap, placed on 5L [place].  Patient usually wears 2.5L at home.  Will titrate down in the next hour to see if patient tolerates and holds oxygen sats.

## 2020-11-07 NOTE — Consult Note (Signed)
NAME:  Megan [name], MRN:  [id number], DOB:  Sep 12, 1951, LOS: 1 ADMISSION DATE:  11/06/2020, CONSULTATION DATE: 11/07/2020 REFERRING MD:  Dr. Manuella Ghazi, CHIEF COMPLAINT:  Abnormal CT scan ? Necrotizing PNA    Brief History:  70 year old female presents with acute respiratory distress with history of multiple pulmonary bacterial and viral infections.  Also has history of esophageal dysmotility.  PCCM consulted for further management of questionable right lower lobe necrotizing pneumonia.  No acute distress.  History of Present Illness:  Megan [name] is a 70 year old female with a past medical history significant for Covid pneumonia August 2021, prior MI, GERD, hypertension, COPD on chronic supplemental oxygen at baseline, congestive heart failure, and anxiety who presented to the emergency department with shortness of breath.  Patient states shortness of breath had improved after having Covid pneumonia August 2021.  She states that her supplemental oxygen post discharge was 2.5 liters continuously.  Of note patient was seen with questionable cavitary lesions in the left upper lobe but during that hospitalization.  Patient reports 2 days prior to this admission shortness of breath had worsened with productive cough as well.  She also reports subjective fever with chills and night sweats that began 2 days prior to admission.  On EMS arrival patient was seen hypoxic requiring placement of CPAP.  On ED arrival patient was transitioned to BiPAP to continue with increased work of breathing.  Vital signs significant on admission for temperature 100.1, tachycardia with a rate of 120-140, tachycardia and mild hypotension.  24.4, chloride 88, glucose 147, creatinine 1.21, high-sensitivity troponin 36, BNP 341.7, WBC 29.8, hemoglobin 10.5 and lactic acidosis 2.0.  Patient was admitted under hospitalist service for sepsis secondary to pneumonia and PCCM was consulted for further management  Past Medical History:  Covid  pneumonia August 2021, prior MI, GERD, hypertension, COPD on chronic supplemental oxygen at baseline, congestive heart failure, and anxiety   Significant Hospital Events:  Admitted 11/07/2018.  Consults:  Pulmonary critical care  Procedures:  None  Significant Diagnostic Tests:  CT chest without contrast 11/07/2020 > Dense, near complete consolidation of the right lower lobe with cavitation in keeping with lobar, necrotizing pneumonia. Complete impaction of the right lower lobar airways. Moderate centrilobular emphysema with marked pulmonary hyperinflation. Multiple pulmonary nodules, likely inflammatory given their relatively rapid development since prior examination.  Micro Data:  COVID 11/06/2020 > negative Blood cultures 11/06/2020 >  Culture 11/06/2020 >  MRSA PCR 11/07/2020 > positive  Antimicrobials:  Levaquin 11/06/2020 Vancomycin 11/06/2020 Flagyl 11/06/2020  Interim History / Subjective:  Sitting up on ED stretcher in no acute distress.  Dyspnea improved since admission.  Objective   Blood pressure 106/67, pulse (!) 121, temperature 98.7 F (37.1 C), temperature source Oral, resp. rate 20, height 5\' 3"  (1.6 m), weight 43.1 kg, SpO2 98 %.    Vent Mode: BIPAP FiO2 (%):  [80 %-100 %] 80 % Set Rate:  [18 bmp] 18 bmp PEEP:  [5 cmH20] 5 cmH20   Intake/Output Summary (Last 24 hours) at 11/07/2020 0750 Last data filed at 11/06/2020 2300 Gross per 24 hour  Intake 1450 ml  Output --  Net 1450 ml   Filed Weights   11/06/20 1929  Weight: 43.1 kg    Examination: General: Very thin cachectic middle-aged female sitting up in bed in no acute distress HEENT: [place]/AT, MM pink/moist, PERRL,  Neuro: Alert and oriented x3, nonfocal CV: s1s2 regular rate and rhythm, no murmur, rubs, or gallops,  PULM: No increased work of breathing,  oxygen saturations appropriate on 3 L nasal cannula, slightly diminished right lower lobe GI: soft, bowel sounds active in all 4 quadrants, non-tender,  non-distended Extremities: warm/dry, no edema  Skin: no rashes or lesions  Resolved Hospital Problem list     Assessment & Plan:  Sepsis secondary to cavitary right lower lobe pneumonia -CT scan as above right lower lobe with dense consolidation and questionable necrotizing pneumonia -Patient has extensive history of multiple bacterial and viral pulmonary infection Chronic hypoxic and hypercapnic respiratory failure  -She utilizes 2-3L [place] continuously at baseline  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 Lamonte Sakai at baseline History of esophageal dysmotility -Patient Has Had a Speech Eval during Last Admission cleared for oral diet.  Patient reports multiple episodes of esophageal dilation in the past P: Continue Levoquin, Vancomycin, and Flagyl Can consider discontinuing vancomycin when cultures negative x48 hours Patient is going to require prolonged antibiotic therapy Encourage frequent pulmonary hygiene Continue supplemental oxygen Obtain respiratory culture Speech evaluation  Best practice (evaluated daily)  Diet: Regular Pain/Anxiety/Delirium protocol (if indicated): As needed VAP protocol (if indicated): Not applicable DVT prophylaxis: Lovenox  GI prophylaxis: PPI Glucose control: SSI Mobility: up with assistance  Disposition: Foor   Labs   CBC: Recent Labs  Lab 11/06/20 1948 11/06/20 2200 11/07/20 0423  WBC  --  29.8* 31.9*  NEUTROABS  --  26.0* 30.0*  HGB 14.3 10.5* 10.0*  HCT 42.0 36.2 33.0*  MCV  --  104.6* 99.4  PLT  --  432* 488*    Basic Metabolic Panel: Recent Labs  Lab 11/06/20 1932 11/06/20 1948 11/07/20 0423  NA 135 131* 134*  K 5.4* 5.2* 4.8  CL 80*  --  87*  CO2 44*  --  35*  GLUCOSE 147*  --  242*  BUN 33*  --  34*  CREATININE 1.21*  --  0.95  CALCIUM 9.7  --  8.7*   GFR: Estimated Creatinine Clearance: 39.1 mL/min (by C-G formula based on SCr of 0.95 mg/dL). Recent Labs  Lab  11/06/20 2050 11/06/20 2151 11/06/20 2200 11/07/20 0423  WBC  --   --  29.8* 31.9*  LATICACIDVEN 2.0* 2.3*  --  1.6    Liver Function Tests: Recent Labs  Lab 11/06/20 1932 11/07/20 0423  AST 12* 11*  ALT 9 8  ALKPHOS 128* 95  BILITOT 0.9 0.5  PROT 6.6 5.4*  ALBUMIN 2.7* 2.1*   No results for input(s): LIPASE, AMYLASE in the last 168 hours. No results for input(s): AMMONIA in the last 168 hours.  ABG    Component Value Date/Time   PHART 7.452 (H) 02/25/2018 0358   PCO2ART 74.5 (HH) 02/25/2018 0358   PO2ART 67.6 (L) 02/25/2018 0358   HCO3 51.1 (H) 11/06/2020 1948   TCO2 >50 (H) 11/06/2020 1948   ACIDBASEDEF 1.0 02/23/2017 1110   O2SAT 55.0 11/06/2020 1948     Coagulation Profile: Recent Labs  Lab 11/06/20 2132  INR 1.4*    Cardiac Enzymes: No results for input(s): CKTOTAL, CKMB, CKMBINDEX, TROPONINI in the last 168 hours.  HbA1C: Hgb A1c MFr Bld  Date/Time Value Ref Range Status  11/07/2020 04:23 AM 6.7 (H) 4.8 - 5.6 % Final    Comment:    (NOTE) Pre diabetes:          5.7%-6.4%  Diabetes:              >6.4%  Glycemic control for   <7.0% adults with diabetes  08/07/2020 12:17 PM 6.9 (H) 4.6 - 6.5 % Final    Comment:    Glycemic Control Guidelines for People with Diabetes:Non Diabetic:  <6%Goal of Therapy: <7%Additional Action Suggested:  >8%     CBG: Recent Labs  Lab 11/07/20 0651  GLUCAP 287*    Review of Systems: Positive in bold  Gen: Denies fever, chills, weight change, fatigue, night sweats HEENT: Denies blurred vision, double vision, hearing loss, tinnitus, sinus congestion, rhinorrhea, sore throat, neck stiffness, dysphagia PULM: Denies shortness of breath, cough, sputum production, hemoptysis, wheezing CV: Denies chest pain, edema, orthopnea, paroxysmal nocturnal dyspnea, palpitations GI: Denies abdominal pain, nausea, vomiting, diarrhea, hematochezia, melena, constipation, change in bowel habits GU: Denies dysuria, hematuria,  polyuria, oliguria, urethral discharge Endocrine: Denies hot or cold intolerance, polyuria, polyphagia or appetite change Derm: Denies rash, dry skin, scaling or peeling skin change Heme: Denies easy bruising, bleeding, bleeding gums Neuro: Denies headache, numbness, weakness, slurred speech, loss of memory or consciousness  Past Medical History:  She,  has a past medical history of Anemia, Anxiety state (08/20/2015), CAP (community acquired pneumonia), CHF (congestive heart failure) ([employer]), COPD (chronic obstructive pulmonary disease) ([place]), Depression, Essential hypertension (08/19/2015), GERD (gastroesophageal reflux disease), Headache, History of hiatal hernia, Myocardial infarction ([place]), and Shortness of breath dyspnea.   Surgical History:   Past Surgical History:  Procedure Laterality Date  . CARDIAC CATHETERIZATION    . CARDIAC CATHETERIZATION N/A 08/26/2015   Procedure: Left Heart Cath and Coronary Angiography;  Surgeon: Jettie Booze, MD;  Location: [facility];  Service: Cardiovascular;  Laterality: N/A;  . ESOPHAGOGASTRODUODENOSCOPY N/A 04/18/2018   Procedure: ESOPHAGOGASTRODUODENOSCOPY (EGD);  Surgeon: Ladene Artist, MD;  Location: [facility] END[facility]COPY;  Service: Endoscopy;  Laterality: N/A;  . tracheostomy       Social History:   reports that she quit smoking about 20 years ago. Her smoking use included cigarettes. She has a 30.00 pack-year smoking history. She has never used smokeless tobacco. She reports that she does not drink alcohol and does not use drugs.   Family History:  Her family history includes Cirrhosis in her mother; Depression in her mother; Heart disease in her father.   Allergies Allergies  Allergen Reactions  . Penicillins Rash and Hives    Has patient had a PCN reaction causing immediate rash, facial/tongue/throat swelling, SOB or lightheadedness with hypotension: No Has patient had a PCN reaction causing severe rash involving mucus membranes or  skin necrosis:NO Has patient had a PCN reaction that required hospitalization No Has patient had a PCN reaction occurring within the last 10 years:NO If all of the above answers are "NO", then may proceed with Cephalosporin use.  Marland Kitchen Morphine And Related Other (See Comments)    Made the patient incoherent and she "talked out her head"  . Cephalexin Rash and Other (See Comments)    Flushed, also     Home Medications  Prior to Admission medications   Medication Sig Start Date End Date Taking? Authorizing Provider  albuterol (VENTOLIN HFA) 108 (90 Base) MCG/ACT inhaler INHALE 2 PUFFS BY MOUTH EVERY 6 HOURS AS NEEDED FOR WHEEZING OR SHORTNESS OF BREATH Patient taking differently: Inhale 2 puffs into the lungs every 6 (six) hours as needed for wheezing or shortness of breath. 08/07/20  Yes [name], MD  ALPRAZolam Duanne Moron) 0.25 MG tablet TAKE 1 TABLET BY MOUTH TWICE DAILY AS NEEDED Patient taking differently: Take 0.25 mg by mouth 2 (two) times daily as needed for anxiety. 08/08/20  Yes [name], MD  aspirin EC 81 MG tablet Take 81 mg by mouth daily.   Yes [provider]  azelastine (ASTELIN) 0.1 % nasal spray Place 1 spray into both nostrils 2 (two) times daily. Use in each nostril as directed 06/12/19  Yes Martyn Ehrich, NP  Butalbital-APAP-Caffeine [phone] MG capsule Take 1 capsule by mouth 2 (two) times daily as needed for headache. 07/28/20  Yes [provider]  diltiazem (CARDIZEM CD) 120 MG 24 hr capsule Take 1 capsule (120 mg total) by mouth daily. 06/18/20  Yes Thurnell Lose, MD  feeding supplement, ENSURE ENLIVE, (ENSURE ENLIVE) LIQD Take 237 mLs by mouth 4 (four) times daily. Patient taking differently: Take 237 mLs by mouth daily. 01/11/17  Yes Eugenie Filler, MD  fluticasone (FLONASE) 50 MCG/ACT nasal spray SHAKE LIQUID AND USE 2 SPRAYS IN EACH NOSTRIL DAILY Patient taking differently: Place 2 sprays into both nostrils daily as needed for allergies.  07/25/19  Yes Collene Gobble, MD  guaiFENesin (MUCINEX) 600 MG 12 hr tablet Take 2 tablets (1,200 mg total) by mouth 2 (two) times daily. Patient taking differently: Take 1,200 mg by mouth 2 (two) times daily as needed for cough or to loosen phlegm. 12/10/19  Yes Martyn Ehrich, NP  hydrochlorothiazide (MICROZIDE) 12.5 MG capsule Take 1 capsule (12.5 mg total) by mouth daily. 08/07/20 08/07/21 Yes [name], MD  ipratropium-albuterol (DUONEB) 0.5-2.5 (3) MG/3ML SOLN Take 3 mLs by nebulization every 6 (six) hours as needed. 08/07/20  Yes [name], MD  loratadine (CLARITIN) 10 MG tablet Take 1 tablet (10 mg total) by mouth daily. Patient taking differently: Take 10 mg by mouth daily as needed for allergies. 11/14/19  Yes [name], MD  Multiple Vitamin (TAB-A-VITE/BETA CAROTENE) TABS Take 1 tablet by mouth daily. 01/01/14  Yes [provider]  ondansetron (ZOFRAN) 4 MG tablet Take 1 tablet (4 mg total) by mouth every 8 (eight) hours as needed for nausea or vomiting. 06/17/20  Yes Thurnell Lose, MD  pantoprazole (PROTONIX) 40 MG tablet TAKE 1 TABLET(40 MG) BY MOUTH DAILY Patient taking differently: Take 40 mg by mouth daily. 12/10/19  Yes [name], MD  polyethylene glycol [facility] / Floria Raveling) packet Take 17 g by mouth 2 (two) times daily. Patient taking differently: Take 17 g by mouth daily as needed for mild constipation. 01/11/17  Yes Eugenie Filler, MD  predniSONE (DELTASONE) 20 MG tablet Take 1 tablet (20 mg total) by mouth daily with breakfast. 08/13/20  Yes Byrum, Rose Fillers, MD  senna-docusate (SENOKOT-S) 8.6-50 MG tablet Take 1 tablet by mouth at bedtime as needed for mild constipation. 06/17/20  Yes Thurnell Lose, MD  TRELEGY ELLIPTA 100-62.5-25 MCG/INH AEPB INHALE 1 PUFF INTO THE LUNGS DAILY 02/26/20  Yes Collene Gobble, MD  lidocaine (LIDODERM) 5 % Place 1 patch onto the skin daily. Remove & Discard patch within 12 hours or asd Patient not taking: No sig reported  07/17/20   [name], MD     Signature:   Johnsie Cancel, NP-C [facility] Contact / Pager information can be found on Amion  11/07/2020, 9:23 AM

## 2020-11-07 NOTE — H&P (Addendum)
History and Physical    Megan [name] ZYS:[id number] DOB: 25-Apr-1951 DOA: 11/06/2020  PCP: [name], MD  Patient coming from: Home.  Chief Complaint: Shortness of breath.  HPI: Megan [name] is a 70 y.o. female with known history of COPD, hypertension admitted in August 2021 for pneumonia secondary to COVID-19 at that time patient was noted to have cavitary lesions in the lung also has a history of esophageal dysmotility has not followed up with GI presents to the ER because of worsening shortness of breath.  Patient states she has been getting increasingly short of breath with productive discolored sputum last 2 days.  Denies chest pain.  Denies any fever chills.  EMS on arrival found patient to be hypoxic and had to be placed on nonrebreather and eventually CPAP.  ED Course: In the ER patient initially was placed on BiPAP chest x-ray showing right-sided consolidation with pleural effusion.  Patient was tachycardic hypotensive elevated lactic leukocytosis consistent with sepsis likely from pneumonia.  Blood cultures obtained patient started on empiric antibiotics.  Patient's respiratory status slowly improved and was weaned off the BiPAP.  Covid test and flu was negative.  Labs are significant for mildly elevated potassium and high sensitive troponin of 36 BNP of 341 hemoglobin of 11.3.  Review of Systems: As per HPI, rest all negative.   Past Medical History:  Diagnosis Date  . Anemia   . Anxiety state 08/20/2015  . CAP (community acquired pneumonia)   . CHF (congestive heart failure) ([place])   . COPD (chronic obstructive pulmonary disease) ([place])   . Depression   . Essential hypertension 08/19/2015  . GERD (gastroesophageal reflux disease)   . Headache   . History of hiatal hernia   . Myocardial infarction ([place])   . Shortness of breath dyspnea     Past Surgical History:  Procedure Laterality Date  . CARDIAC CATHETERIZATION    . CARDIAC CATHETERIZATION N/A 08/26/2015    Procedure: Left Heart Cath and Coronary Angiography;  Surgeon: Jettie Booze, MD;  Location: [facility];  Service: Cardiovascular;  Laterality: N/A;  . ESOPHAGOGASTRODUODENOSCOPY N/A 04/18/2018   Procedure: ESOPHAGOGASTRODUODENOSCOPY (EGD);  Surgeon: Ladene Artist, MD;  Location: [facility];  Service: Endoscopy;  Laterality: N/A;  . tracheostomy       reports that she quit smoking about 20 years ago. Her smoking use included cigarettes. She has a 30.00 pack-year smoking history. She has never used smokeless tobacco. She reports that she does not drink alcohol and does not use drugs.  Allergies  Allergen Reactions  . Penicillins Rash and Hives    Has patient had a PCN reaction causing immediate rash, facial/tongue/throat swelling, SOB or lightheadedness with hypotension: No Has patient had a PCN reaction causing severe rash involving mucus membranes or skin necrosis:NO Has patient had a PCN reaction that required hospitalization No Has patient had a PCN reaction occurring within the last 10 years:NO If all of the above answers are "NO", then may proceed with Cephalosporin use.  Marland Kitchen Morphine And Related Other (See Comments)    Made the patient incoherent and she "talked out her head"  . Cephalexin Rash and Other (See Comments)    Flushed, also    Family History  Problem Relation Age of Onset  . Cirrhosis Mother   . Depression Mother   . Heart disease Father     Prior to Admission medications   Medication Sig Start Date End Date Taking? Authorizing Provider  albuterol (VENTOLIN HFA)  108 (90 Base) MCG/ACT inhaler INHALE 2 PUFFS BY MOUTH EVERY 6 HOURS AS NEEDED FOR WHEEZING OR SHORTNESS OF BREATH Patient taking differently: Inhale 2 puffs into the lungs every 6 (six) hours as needed for wheezing or shortness of breath. 08/07/20  Yes [name], MD  ALPRAZolam Duanne Moron) 0.25 MG tablet TAKE 1 TABLET BY MOUTH TWICE DAILY AS NEEDED Patient taking differently: Take 0.25 mg by  mouth 2 (two) times daily as needed for anxiety. 08/08/20  Yes [name], MD  aspirin EC 81 MG tablet Take 81 mg by mouth daily.   Yes [provider]  azelastine (ASTELIN) 0.1 % nasal spray Place 1 spray into both nostrils 2 (two) times daily. Use in each nostril as directed 06/12/19  Yes Martyn Ehrich, NP  Butalbital-APAP-Caffeine [id number] MG capsule Take 1 capsule by mouth 2 (two) times daily as needed for headache. 07/28/20  Yes [provider]  diltiazem (CARDIZEM CD) 120 MG 24 hr capsule Take 1 capsule (120 mg total) by mouth daily. 06/18/20  Yes Thurnell Lose, MD  feeding supplement, ENSURE ENLIVE, (ENSURE ENLIVE) LIQD Take 237 mLs by mouth 4 (four) times daily. Patient taking differently: Take 237 mLs by mouth daily. 01/11/17  Yes Eugenie Filler, MD  fluticasone (FLONASE) 50 MCG/ACT nasal spray SHAKE LIQUID AND USE 2 SPRAYS IN EACH NOSTRIL DAILY Patient taking differently: Place 2 sprays into both nostrils daily as needed for allergies. 07/25/19  Yes Collene Gobble, MD  guaiFENesin (MUCINEX) 600 MG 12 hr tablet Take 2 tablets (1,200 mg total) by mouth 2 (two) times daily. Patient taking differently: Take 1,200 mg by mouth 2 (two) times daily as needed for cough or to loosen phlegm. 12/10/19  Yes Martyn Ehrich, NP  hydrochlorothiazide (MICROZIDE) 12.5 MG capsule Take 1 capsule (12.5 mg total) by mouth daily. 08/07/20 08/07/21 Yes [name], MD  ipratropium-albuterol (DUONEB) 0.5-2.5 (3) MG/3ML SOLN Take 3 mLs by nebulization every 6 (six) hours as needed. 08/07/20  Yes [name], MD  loratadine (CLARITIN) 10 MG tablet Take 1 tablet (10 mg total) by mouth daily. Patient taking differently: Take 10 mg by mouth daily as needed for allergies. 11/14/19  Yes [name], MD  Multiple Vitamin (TAB-A-VITE/BETA CAROTENE) TABS Take 1 tablet by mouth daily. 01/01/14  Yes [provider]  ondansetron (ZOFRAN) 4 MG tablet Take 1 tablet (4 mg total) by mouth  every 8 (eight) hours as needed for nausea or vomiting. 06/17/20  Yes Thurnell Lose, MD  pantoprazole (PROTONIX) 40 MG tablet TAKE 1 TABLET(40 MG) BY MOUTH DAILY Patient taking differently: Take 40 mg by mouth daily. 12/10/19  Yes [name], MD  polyethylene glycol [facility] / Floria Raveling) packet Take 17 g by mouth 2 (two) times daily. Patient taking differently: Take 17 g by mouth daily as needed for mild constipation. 01/11/17  Yes Eugenie Filler, MD  predniSONE (DELTASONE) 20 MG tablet Take 1 tablet (20 mg total) by mouth daily with breakfast. 08/13/20  Yes Byrum, Rose Fillers, MD  senna-docusate (SENOKOT-S) 8.6-50 MG tablet Take 1 tablet by mouth at bedtime as needed for mild constipation. 06/17/20  Yes Thurnell Lose, MD  TRELEGY ELLIPTA 100-62.5-25 MCG/INH AEPB INHALE 1 PUFF INTO THE LUNGS DAILY 02/26/20  Yes Collene Gobble, MD  lidocaine (LIDODERM) 5 % Place 1 patch onto the skin daily. Remove & Discard patch within 12 hours or asd Patient not taking: No sig reported 07/17/20  [name], MD    Physical Exam: Constitutional: Moderately built and nourished. Vitals:   11/07/20 0015 11/07/20 0030 11/07/20 0100 11/07/20 0102  BP: 101/62 (!) 101/56 98/62   Pulse: (!) 122 (!) 129 (!) 121   Resp: (!) 27 (!) 21 (!) 25   Temp:      TempSrc:      SpO2: 100% 96% 92% 100%  Weight:      Height:       Eyes: Anicteric no pallor. ENMT: No discharge from the ears eyes nose or mouth. Neck: No mass felt.  No neck rigidity. Respiratory: No wheezing or crepitations. Cardiovascular: S1-S2 heard. Abdomen: Soft nontender bowel sounds present. Musculoskeletal: No edema. Skin: No rash. Neurologic: Alert awake oriented to time place and person.  Moves all extremities. Psychiatric: Appears normal.  Normal affect.   Labs on Admission: I have personally reviewed following labs and imaging studies  CBC: Recent Labs  Lab 11/06/20 1948 11/06/20 2200  WBC  --  29.8*  NEUTROABS  --  26.0*  HGB  14.3 10.5*  HCT 42.0 36.2  MCV  --  104.6*  PLT  --  562*   Basic Metabolic Panel: Recent Labs  Lab 11/06/20 1932 11/06/20 1948  NA 135 131*  K 5.4* 5.2*  CL 80*  --   CO2 44*  --   GLUCOSE 147*  --   BUN 33*  --   CREATININE 1.21*  --   CALCIUM 9.7  --    GFR: Estimated Creatinine Clearance: 30.7 mL/min (A) (by C-G formula based on SCr of 1.21 mg/dL (H)). Liver Function Tests: Recent Labs  Lab 11/06/20 1932  AST 12*  ALT 9  ALKPHOS 128*  BILITOT 0.9  PROT 6.6  ALBUMIN 2.7*   No results for input(s): LIPASE, AMYLASE in the last 168 hours. No results for input(s): AMMONIA in the last 168 hours. Coagulation Profile: Recent Labs  Lab 11/06/20 2132  INR 1.4*   Cardiac Enzymes: No results for input(s): CKTOTAL, CKMB, CKMBINDEX, TROPONINI in the last 168 hours. BNP (last 3 results) No results for input(s): PROBNP in the last 8760 hours. HbA1C: No results for input(s): HGBA1C in the last 72 hours. CBG: No results for input(s): GLUCAP in the last 168 hours. Lipid Profile: No results for input(s): CHOL, HDL, LDLCALC, TRIG, CHOLHDL, LDLDIRECT in the last 72 hours. Thyroid Function Tests: No results for input(s): TSH, T4TOTAL, FREET4, T3FREE, THYROIDAB in the last 72 hours. Anemia Panel: No results for input(s): VITAMINB12, FOLATE, FERRITIN, TIBC, IRON, RETICCTPCT in the last 72 hours. Urine analysis:    Component Value Date/Time   COLORURINE YELLOW 08/07/2020 1217   APPEARANCEUR CLEAR 08/07/2020 1217   LABSPEC 1.015 08/07/2020 1217   PHURINE 7.5 08/07/2020 1217   GLUCOSEU NEGATIVE 08/07/2020 1217   HGBUR NEGATIVE 08/07/2020 1217   BILIRUBINUR NEGATIVE 08/07/2020 1217   KETONESUR NEGATIVE 08/07/2020 1217   PROTEINUR 100 (A) 11/04/2018 1622   UROBILINOGEN 0.2 08/07/2020 1217   NITRITE NEGATIVE 08/07/2020 1217   LEUKOCYTESUR NEGATIVE 08/07/2020 1217   Sepsis Labs: @LABRCNTIP (procalcitonin:4,lacticidven:4) ) Recent Results (from the past 240 hour(s))  Resp  Panel by RT-PCR (Flu A&B, Covid) Nasopharyngeal Swab     Status: None   Collection Time: 11/06/20  7:35 PM   Specimen: Nasopharyngeal Swab; Nasopharyngeal(NP) swabs in vial transport medium  Result Value Ref Range Status   SARS Coronavirus 2 by RT PCR NEGATIVE NEGATIVE Final    Comment: (NOTE) SARS-CoV-2 target nucleic acids are NOT DETECTED.  The SARS-CoV-2 RNA is generally detectable in upper respiratory specimens during the acute phase of infection. The lowest concentration of SARS-CoV-2 viral copies this assay can detect is 138 copies/mL. A negative result does not preclude SARS-Cov-2 infection and should not be used as the sole basis for treatment or other patient management decisions. A negative result may occur with  improper specimen collection/handling, submission of specimen other than nasopharyngeal swab, presence of viral mutation(s) within the areas targeted by this assay, and inadequate number of viral copies(<138 copies/mL). A negative result must be combined with clinical observations, patient history, and epidemiological information. The expected result is Negative.  Fact Sheet for Patients:  EntrepreneurPulse.com.au  Fact Sheet for Healthcare Providers:  IncredibleEmployment.be  This test is no t yet approved or cleared by the [place] FDA and  has been authorized for detection and/or diagnosis of SARS-CoV-2 by FDA under an Emergency Use Authorization (EUA). This EUA will remain  in effect (meaning this test can be used) for the duration of the COVID-19 declaration under Section 564(b)(1) of the Act, 21 U.S.C.section 360bbb-3(b)(1), unless the authorization is terminated  or revoked sooner.       Influenza A by PCR NEGATIVE NEGATIVE Final   Influenza B by PCR NEGATIVE NEGATIVE Final    Comment: (NOTE) The Xpert Xpress SARS-CoV-2/FLU/RSV plus assay is intended as an aid in the diagnosis of influenza from Nasopharyngeal  swab specimens and should not be used as a sole basis for treatment. Nasal washings and aspirates are unacceptable for Xpert Xpress SARS-CoV-2/FLU/RSV testing.  Fact Sheet for Patients: EntrepreneurPulse.com.au  Fact Sheet for Healthcare Providers: IncredibleEmployment.be  This test is not yet approved or cleared by the [place] FDA and has been authorized for detection and/or diagnosis of SARS-CoV-2 by FDA under an Emergency Use Authorization (EUA). This EUA will remain in effect (meaning this test can be used) for the duration of the COVID-19 declaration under Section 564(b)(1) of the Act, 21 U.S.C. section 360bbb-3(b)(1), unless the authorization is terminated or revoked.  Performed at [facility], [place] [address], [place], [place] [address]      Radiological Exams on Admission: DG Chest Port 1 View  Result Date: 11/06/2020 CLINICAL DATA:  Short of breath, hypoxia, respiratory distress EXAM: PORTABLE CHEST 1 VIEW COMPARISON:  08/07/2020 FINDINGS: 2 frontal views of the chest demonstrate dense right basilar consolidation with small right pleural effusion. No pneumothorax. Background emphysema again noted. Cardiac silhouette is stable. No acute bony abnormalities. IMPRESSION: 1. Dense right lower lobe pneumonia with small right parapneumonic effusion. Electronically Signed   By: Randa Ngo M.D.   On: 11/06/2020 20:10    EKG: Independently reviewed.  Sinus tachycardia.  Assessment/Plan Active Problems:   COPD (chronic obstructive pulmonary disease) ([place])   Hyperglycemia   Sepsis ([place])   CAP (community acquired pneumonia)   Acute respiratory failure with hypoxia ([place])    1. Sepsis likely from pneumonia with acute respiratory failure with hypoxia requiring BiPAP initially improved presently off BiPAP on 3 L oxygen -continue empiric antibiotics since patient has consolidation with possible parapneumonic effusion will get a CT  chest to further assess effusion and also with history of cavitary pneumonia.  Get sputum cultures blood cultures continue hydration.  Check procalcitonin and lactic acid levels. 2. History of hypertension presently blood pressures in the low normal range so holding of antihypertensive follow blood pressure trends. 3. History of esophageal dysmotility -  will check speech therapy evaluation for swallow.  Will need GI  follow-up. 4. History of hyperglycemia last hemoglobin A1c was around 7.4 in August 2021.  Patient is on chronic steroids.  Check hemoglobin A1c.  Sliding scale coverage. 5. Anemia appears to be chronic follow CBC. 6. Mildly elevated BNP levels.  Check 2D echo.  Since patient has respiratory failure with sepsis and pneumonia will need inpatient status.   DVT prophylaxis: Lovenox. Code Status: Full code. Family Communication: Discussed with patient. Disposition Plan: Home. Consults called: We will consult pulmonary critical care. Admission status: Inpatient.   Rise Patience MD [facility] Pager [phone].  If 7PM-7AM, please contact night-coverage [email or web address] Password [email or web address]  11/07/2020, 1:37 AM

## 2020-11-07 NOTE — Evaluation (Signed)
Clinical/Bedside Swallow Evaluation Patient Details  Name: Megan [name] MRN: [id number] Date of Birth: 11-Oct-1951  Today's Date: 11/07/2020 Time: SLP Start Time (ACUTE ONLY): 0940 SLP Stop Time (ACUTE ONLY): 1010 SLP Time Calculation (min) (ACUTE ONLY): 30 min  Past Medical History:  Past Medical History:  Diagnosis Date  . Anemia   . Anxiety state 08/20/2015  . CAP (community acquired pneumonia)   . CHF (congestive heart failure) ([place])   . COPD (chronic obstructive pulmonary disease) ([place])   . Depression   . Essential hypertension 08/19/2015  . GERD (gastroesophageal reflux disease)   . Headache   . History of hiatal hernia   . Myocardial infarction ([place])   . Shortness of breath dyspnea    Past Surgical History:  Past Surgical History:  Procedure Laterality Date  . CARDIAC CATHETERIZATION    . CARDIAC CATHETERIZATION N/A 08/26/2015   Procedure: Left Heart Cath and Coronary Angiography;  Surgeon: Jettie Booze, MD;  Location: [facility];  Service: Cardiovascular;  Laterality: N/A;  . ESOPHAGOGASTRODUODENOSCOPY N/A 04/18/2018   Procedure: ESOPHAGOGASTRODUODENOSCOPY (EGD);  Surgeon: Ladene Artist, MD;  Location: [facility] ENDOSCOPY;  Service: Endoscopy;  Laterality: N/A;  . tracheostomy     HPI:  Megan [name] is a 70 y.o. female with known history of COPD, hypertension admitted in August 2021 for pneumonia secondary to COVID-19 at that time patient was noted to have cavitary lesions in the lung also has a history of esophageal dysmotility has not followed up with GI presents to the ER because of worsening shortness of breath. 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 Large hiatal hernia with stasis of contrast. 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  Pt is known to SLP services from prior admissions and recurring dysphagias related to prolonged intubations, trach, and COPD exacerbations. Oropharyngeal swallow has always appeared WNL in prior assessments.   Assessment / Plan / Recommendation Clinical Impression  Pt demonstrates no signs of aspiration, but does confirm esophageal dysphagia including globus, regurigitation and early satiety. She takes a few bites of food then stops. Encouraged upright positioning, sleeping on a wedge (which she likely does not have access to), following solids with liquids, eating several small snacks and meals throughout the day, avoiding spicy and acidic foods and finally using dietary supplements such as Ensure. Pt reported she could not afford Ensure. Requested dietitian consult to address this from MD. All education complete. Will sign off. SLP Visit Diagnosis: Dysphagia, unspecified (R13.10)    Aspiration Risk  Severe aspiration risk;Risk for inadequate nutrition/hydration    Diet Recommendation Regular;Thin liquid   Liquid Administration via: Cup;Straw Medication Administration: Whole meds with liquid Supervision: Patient able to self feed Compensations: Follow solids with liquid Postural Changes: Seated upright at 90 degrees;Remain upright for at least 30 minutes after po intake    Other  Recommendations Oral Care Recommendations: Oral care BID   Follow up Recommendations None      Frequency and Duration            Prognosis        Swallow Study   General HPI: Megan [name] is a 70 y.o. female with known history of COPD, hypertension  admitted in August 2021 for pneumonia secondary to COVID-19 at that time patient was noted to have cavitary lesions in the lung also has a history of esophageal dysmotility has not followed up with GI presents to the  ER because of worsening shortness of breath. 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 Large hiatal hernia with stasis of contrast. 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  Pt is known to SLP services from prior admissions and recurring dysphagias related to prolonged intubations, trach, and COPD exacerbations. Oropharyngeal swallow has always appeared WNL in prior assessments. Type of Study: Bedside Swallow Evaluation Diet Prior to this Study: Regular;Thin liquids Temperature Spikes Noted: No Respiratory Status: Room air History of Recent Intubation: No Behavior/Cognition: Alert;Cooperative;Pleasant mood Oral Cavity Assessment: Within Functional Limits Oral Care Completed by SLP: No Oral Cavity - Dentition: Adequate natural dentition Self-Feeding Abilities: Able to feed self Patient Positioning: Upright in bed Baseline Vocal Quality: Normal Volitional Cough: Strong Volitional Swallow: Able to elicit    Oral/Motor/Sensory Function     Ice Chips     Thin Liquid Thin Liquid: Within functional limits    Nectar Thick Nectar Thick Liquid: Not tested   Honey Thick Honey Thick Liquid: Not tested Presentation: Self fed   Puree Puree: Not tested   Solid     Solid: Not tested     Herbie Baltimore, MA CCC-SLP  Acute Rehabilitation Services Pager [phone] Office [phone]  Lynann Beaver 11/07/2020,1:40 PM

## 2020-11-07 NOTE — Progress Notes (Signed)
PHARMACY NOTE:  ANTIMICROBIAL RENAL DOSAGE ADJUSTMENT  Current antimicrobial regimen includes a mismatch between antimicrobial dosage and estimated renal function.  As per policy approved by the Pharmacy & Therapeutics and Medical Executive Committees, the antimicrobial dosage will be adjusted accordingly.  Current antimicrobial dosage:  Levaquin 750mg  IV q24h  Indication: CAP  Renal Function:  Estimated Creatinine Clearance: 30.7 mL/min (A) (by C-G formula based on SCr of 1.21 mg/dL (H)).    Antimicrobial dosage has been changed to:  Levaquin 750mg  IV q48h    Thank you for allowing pharmacy to be a part of this patient's care.  Sherlon Handing, PharmD, BCPS Please see amion for complete clinical pharmacist phone list 11/07/2020 1:49 AM

## 2020-11-07 NOTE — Progress Notes (Signed)
PROGRESS NOTE    Megan [name]  WNI:[id number] DOB: [birth year] DOA: 11/06/2020 PCP: [name], MD   Brief Narrative:  Megan [name] is a 70 y.o. female with known history of COPD, hypertension admitted in August 2021 for pneumonia secondary to COVID-19 at that time patient was noted to have cavitary lesions in the lung also has a history of esophageal dysmotility has not followed up with GI presents to the ER because of worsening shortness of breath. She was noted to have right-sided consolidation with pleural effusion on chest x-ray. CT chest was obtained to further assess the effusion and presence of possible cavitary pneumonia and this was confirmed with questionable necrotizing pneumonia noted. PCCM has evaluated patient with plans to continue Levaquin, vancomycin, and Flagyl as otherwise ordered.   Assessment & Plan:   Active Problems:   COPD (chronic obstructive pulmonary disease) ([place])   Hyperglycemia   Sepsis ([place])   CAP (community acquired pneumonia)   Acute respiratory failure with hypoxia ([place])   1. Sepsis secondary to cavitary right lower lobe pneumonia with questionable necrotizing pneumonia  -continue on Levaquin, vancomycin, and Flagyl. Appreciate PCCM evaluation and management. 2. Acute on chronic hypoxemic and hypercapnic respiratory failure secondary to above. Currently on 3 L nasal cannula which is apparently her baseline. 3. History of hypertension -currently stable. Home diltiazem resumed. Heart rates are elevated. 4. History of recent vaginal dysmotility will check speech therapy evaluation for swallow.  Will need GI follow-up. 5. History of hyperglycemia last hemoglobin A1c was around 7.4 in August 2021.  Patient is on chronic steroids.   Current hemoglobin A1c is 6.7%.  Sliding scale coverage to be increased given some hyperglycemia 6. Anemia appears to be chronic follow CBC which is currently stable 7. Mildly elevated BNP levels.  Check 2D echo-pending.    DVT  prophylaxis: Lovenox Code Status: Full Family Communication: Tried calling daughter; wrong number noted on mobile 1/7; did not pick up home phone Disposition Plan:  Status is: Inpatient  Remains inpatient appropriate because:Ongoing diagnostic testing needed not appropriate for outpatient work up, IV treatments appropriate due to intensity of illness or inability to take PO and Inpatient level of care appropriate due to severity of illness   Dispo: The patient is from: Home              Anticipated d/c is to: Home              Anticipated d/c date is: > 3 days              Patient currently is not medically stable to d/c. Patient requires aggressive treatment with antibiotics as ordered for sepsis secondary to cavitary right lower lobe pneumonia.   Consultants:   PCCM  Procedures:   See below  Antimicrobials:  Anti-infectives (From admission, onward)   Start     Dose/Rate Route Frequency Ordered Stop   11/08/20 2000  levofloxacin (LEVAQUIN) IVPB 750 mg        750 mg 100 mL/hr over 90 Minutes Intravenous Every 48 hours 11/07/20 0149 11/12/20 1959   11/08/20 0930  vancomycin (VANCOREADY) IVPB 750 mg/150 mL        750 mg 150 mL/hr over 60 Minutes Intravenous Every 36 hours 11/06/20 2130     11/07/20 0800  metroNIDAZOLE (FLAGYL) IVPB 500 mg        500 mg 100 mL/hr over 60 Minutes Intravenous Every 8 hours 11/07/20 0744     11/07/20 0145  levofloxacin (LEVAQUIN) IVPB  750 mg  Status:  Discontinued        750 mg 100 mL/hr over 90 Minutes Intravenous Every 24 hours 11/07/20 0136 11/07/20 0148   11/06/20 2045  vancomycin (VANCOCIN) IVPB 1000 mg/200 mL premix        1,000 mg 200 mL/hr over 60 Minutes Intravenous  Once 11/06/20 2033 11/06/20 2230   11/06/20 2030  levofloxacin (LEVAQUIN) IVPB 750 mg        750 mg 100 mL/hr over 90 Minutes Intravenous  Once 11/06/20 2016 11/06/20 2230       Subjective: Patient seen and evaluated today with no new acute complaints or concerns. No  acute concerns or events noted overnight.  Objective: Vitals:   11/07/20 0821 11/07/20 0900 11/07/20 1000 11/07/20 1100  BP:  117/65 136/60 (!) 127/53  Pulse:  (!) 126 (!) 129 (!) 132  Resp:  (!) 23 (!) 25 20  Temp: 98.2 F (36.8 C)     TempSrc: Oral     SpO2:  98% 96% 96%  Weight:      Height:        Intake/Output Summary (Last 24 hours) at 11/07/2020 1143 Last data filed at 11/06/2020 2300 Gross per 24 hour  Intake 1450 ml  Output -  Net 1450 ml   Filed Weights   11/06/20 1929  Weight: 43.1 kg    Examination:  General exam: Appears calm and comfortable, thin Respiratory system: Clear to auscultation. Respiratory effort normal. Currently on 3 L nasal cannula oxygen. Cardiovascular system: S1 & S2 heard, RRR.  Gastrointestinal system: Abdomen is soft Central nervous system: Alert and awake Extremities: No edema Skin: No significant lesions noted Psychiatry: Flat affect.    Data Reviewed: I have personally reviewed following labs and imaging studies  CBC: Recent Labs  Lab 11/06/20 1948 11/06/20 2200 11/07/20 0423  WBC  --  29.8* 31.9*  NEUTROABS  --  26.0* 30.0*  HGB 14.3 10.5* 10.0*  HCT 42.0 36.2 33.0*  MCV  --  104.6* 99.4  PLT  --  432* 263*   Basic Metabolic Panel: Recent Labs  Lab 11/06/20 1932 11/06/20 1948 11/07/20 0423  NA 135 131* 134*  K 5.4* 5.2* 4.8  CL 80*  --  87*  CO2 44*  --  35*  GLUCOSE 147*  --  242*  BUN 33*  --  34*  CREATININE 1.21*  --  0.95  CALCIUM 9.7  --  8.7*   GFR: Estimated Creatinine Clearance: 39.1 mL/min (by C-G formula based on SCr of 0.95 mg/dL). Liver Function Tests: Recent Labs  Lab 11/06/20 1932 11/07/20 0423  AST 12* 11*  ALT 9 8  ALKPHOS 128* 95  BILITOT 0.9 0.5  PROT 6.6 5.4*  ALBUMIN 2.7* 2.1*   No results for input(s): LIPASE, AMYLASE in the last 168 hours. No results for input(s): AMMONIA in the last 168 hours. Coagulation Profile: Recent Labs  Lab 11/06/20 2132  INR 1.4*   Cardiac  Enzymes: No results for input(s): CKTOTAL, CKMB, CKMBINDEX, TROPONINI in the last 168 hours. BNP (last 3 results) No results for input(s): PROBNP in the last 8760 hours. HbA1C: Recent Labs    11/07/20 0423  HGBA1C 6.7*   CBG: Recent Labs  Lab 11/07/20 0651  GLUCAP 287*   Lipid Profile: No results for input(s): CHOL, HDL, LDLCALC, TRIG, CHOLHDL, LDLDIRECT in the last 72 hours. Thyroid Function Tests: No results for input(s): TSH, T4TOTAL, FREET4, T3FREE, THYROIDAB in the last 72 hours. Anemia Panel:  No results for input(s): VITAMINB12, FOLATE, FERRITIN, TIBC, IRON, RETICCTPCT in the last 72 hours. Sepsis Labs: Recent Labs  Lab 11/06/20 2050 11/06/20 2151 11/07/20 0423  PROCALCITON  --   --  32.74  LATICACIDVEN 2.0* 2.3* 1.6    Recent Results (from the past 240 hour(s))  Resp Panel by RT-PCR (Flu A&B, Covid) Nasopharyngeal Swab     Status: None   Collection Time: 11/06/20  7:35 PM   Specimen: Nasopharyngeal Swab; Nasopharyngeal(NP) swabs in vial transport medium  Result Value Ref Range Status   SARS Coronavirus 2 by RT PCR NEGATIVE NEGATIVE Final    Comment: (NOTE) SARS-CoV-2 target nucleic acids are NOT DETECTED.  The SARS-CoV-2 RNA is generally detectable in upper respiratory specimens during the acute phase of infection. The lowest concentration of SARS-CoV-2 viral copies this assay can detect is 138 copies/mL. A negative result does not preclude SARS-Cov-2 infection and should not be used as the sole basis for treatment or other patient management decisions. A negative result may occur with  improper specimen collection/handling, submission of specimen other than nasopharyngeal swab, presence of viral mutation(s) within the areas targeted by this assay, and inadequate number of viral copies(<138 copies/mL). A negative result must be combined with clinical observations, patient history, and epidemiological information. The expected result is Negative.  Fact  Sheet for Patients:  EntrepreneurPulse.com.au  Fact Sheet for Healthcare Providers:  IncredibleEmployment.be  This test is no t yet approved or cleared by the [place] FDA and  has been authorized for detection and/or diagnosis of SARS-CoV-2 by FDA under an Emergency Use Authorization (EUA). This EUA will remain  in effect (meaning this test can be used) for the duration of the COVID-19 declaration under Section 564(b)(1) of the Act, 21 U.S.C.section 360bbb-3(b)(1), unless the authorization is terminated  or revoked sooner.       Influenza A by PCR NEGATIVE NEGATIVE Final   Influenza B by PCR NEGATIVE NEGATIVE Final    Comment: (NOTE) The Xpert Xpress SARS-CoV-2/FLU/RSV plus assay is intended as an aid in the diagnosis of influenza from Nasopharyngeal swab specimens and should not be used as a sole basis for treatment. Nasal washings and aspirates are unacceptable for Xpert Xpress SARS-CoV-2/FLU/RSV testing.  Fact Sheet for Patients: EntrepreneurPulse.com.au  Fact Sheet for Healthcare Providers: IncredibleEmployment.be  This test is not yet approved or cleared by the [place] FDA and has been authorized for detection and/or diagnosis of SARS-CoV-2 by FDA under an Emergency Use Authorization (EUA). This EUA will remain in effect (meaning this test can be used) for the duration of the COVID-19 declaration under Section 564(b)(1) of the Act, 21 U.S.C. section 360bbb-3(b)(1), unless the authorization is terminated or revoked.  Performed at [facility], [place] [address], [place], [place] [address]   MRSA PCR Screening     Status: Abnormal   Collection Time: 11/07/20  5:35 AM   Specimen: Nasopharyngeal  Result Value Ref Range Status   MRSA by PCR POSITIVE (A) NEGATIVE Final    Comment:        The GeneXpert MRSA Assay (FDA approved for NASAL specimens only), is one component of  a comprehensive MRSA colonization surveillance program. It is not intended to diagnose MRSA infection nor to guide or monitor treatment for MRSA infections. RESULT CALLED TO, READ BACK BY AND VERIFIED WITH: Herschel Senegal RN 8:35 11/07/20 ([email or web address]) Performed at [facility], [place] [address] [place], [place] [address]  Radiology Studies: CT CHEST WO CONTRAST  Result Date: 11/07/2020 CLINICAL DATA:  Pneumonia EXAM: CT CHEST WITHOUT CONTRAST TECHNIQUE: Multidetector CT imaging of the chest was performed following the standard protocol without IV contrast. COMPARISON:  06/11/2020 FINDINGS: Cardiovascular: No significant coronary artery calcification. Global cardiac size within normal limits. No pericardial effusion. The central pulmonary arteries are enlarged in keeping with changes of pulmonary arterial hypertension. Mild atherosclerotic calcification within the thoracic aorta. No aortic aneurysm. Mediastinum/Nodes: Thyroid unremarkable. No pathologic thoracic adenopathy. Esophagus is unremarkable. Lungs/Pleura: Moderate centrilobular emphysema with marked pulmonary hyperinflation. Mild biapical scarring. There is dense consolidation of the right lower lobe with scattered areas of cavitation in keeping with lobar, necrotizing pneumonia. The right lower lobe pulmonary bronchi are impacted and occluded proximally. No pneumothorax or pleural effusion. Scattered airway impaction is seen within the left lower lobe. There is associated bronchial wall thickening in keeping with airway inflammation. 8 mm nodule within the right middle lobe, axial image # 97, and 6 mm subpleural nodule within the left lower lobe at axial image # 83 are new since prior examination and may be inflammatory in nature. Upper Abdomen: No acute abnormality Musculoskeletal: No acute bone abnormality peer IMPRESSION: Dense, near complete consolidation of the right lower lobe with cavitation in keeping with lobar, necrotizing  pneumonia. Complete impaction of the right lower lobar airways. Moderate centrilobular emphysema with marked pulmonary hyperinflation. Morphologic changes of pulmonary arterial hypertension. Multiple pulmonary nodules, likely inflammatory given their relatively rapid development since prior examination. Aortic Atherosclerosis (ICD10-I70.0). Electronically Signed   By: Fidela Salisbury MD   On: 11/07/2020 02:19   DG Chest Port 1 View  Result Date: 11/06/2020 CLINICAL DATA:  Short of breath, hypoxia, respiratory distress EXAM: PORTABLE CHEST 1 VIEW COMPARISON:  08/07/2020 FINDINGS: 2 frontal views of the chest demonstrate dense right basilar consolidation with small right pleural effusion. No pneumothorax. Background emphysema again noted. Cardiac silhouette is stable. No acute bony abnormalities. IMPRESSION: 1. Dense right lower lobe pneumonia with small right parapneumonic effusion. Electronically Signed   By: Randa Ngo M.D.   On: 11/06/2020 20:10        Scheduled Meds: . aspirin EC  81 mg Oral Daily  . diltiazem  120 mg Oral Daily  . enoxaparin (LOVENOX) injection  40 mg Subcutaneous Q24H  . insulin aspart  0-9 Units Subcutaneous TID WC  . methylPREDNISolone (SOLU-MEDROL) injection  40 mg Intravenous Q12H  . pantoprazole  40 mg Oral Daily   Continuous Infusions: . lactated ringers 125 mL/hr at 11/07/20 0449  . [START ON 11/08/2020] levofloxacin (LEVAQUIN) IV    . metronidazole Stopped (11/07/20 1040)  . [START ON 11/08/2020] vancomycin       LOS: 1 day    Time spent: 35 minutes    Tanza Pellot Darleen Crocker, DO [facility]  If 7PM-7AM, please contact night-coverage [email or web address] 11/07/2020, 11:43 AM

## 2020-11-08 ENCOUNTER — Inpatient Hospital Stay (HOSPITAL_COMMUNITY): Payer: Medicare Other

## 2020-11-08 DIAGNOSIS — R0602 Shortness of breath: Secondary | ICD-10-CM | POA: Diagnosis not present

## 2020-11-08 DIAGNOSIS — J9601 Acute respiratory failure with hypoxia: Secondary | ICD-10-CM | POA: Diagnosis not present

## 2020-11-08 DIAGNOSIS — J189 Pneumonia, unspecified organism: Secondary | ICD-10-CM | POA: Diagnosis not present

## 2020-11-08 LAB — CBC
HCT: 33.1 % — ABNORMAL LOW (ref 36.0–46.0)
Hemoglobin: 9.7 g/dL — ABNORMAL LOW (ref 12.0–15.0)
MCH: 29 pg (ref 26.0–34.0)
MCHC: 29.3 g/dL — ABNORMAL LOW (ref 30.0–36.0)
MCV: 99.1 fL (ref 80.0–100.0)
Platelets: 523 10*3/uL — ABNORMAL HIGH (ref 150–400)
RBC: 3.34 MIL/uL — ABNORMAL LOW (ref 3.87–5.11)
RDW: 13.6 % (ref 11.5–15.5)
WBC: 31.5 10*3/uL — ABNORMAL HIGH (ref 4.0–10.5)
nRBC: 0 % (ref 0.0–0.2)

## 2020-11-08 LAB — BASIC METABOLIC PANEL
Anion gap: 8 (ref 5–15)
BUN: 32 mg/dL — ABNORMAL HIGH (ref 8–23)
CO2: 40 mmol/L — ABNORMAL HIGH (ref 22–32)
Calcium: 9.1 mg/dL (ref 8.9–10.3)
Chloride: 89 mmol/L — ABNORMAL LOW (ref 98–111)
Creatinine, Ser: 0.8 mg/dL (ref 0.44–1.00)
GFR, Estimated: >60 mL/min (ref >60)
Glucose, Bld: 180 mg/dL — ABNORMAL HIGH (ref 70–99)
Potassium: 4.5 mmol/L (ref 3.5–5.1)
Sodium: 137 mmol/L (ref 135–145)

## 2020-11-08 LAB — GLUCOSE, CAPILLARY
Glucose-Capillary: 109 mg/dL — ABNORMAL HIGH (ref 70–99)
Glucose-Capillary: 145 mg/dL — ABNORMAL HIGH (ref 70–99)
Glucose-Capillary: 249 mg/dL — ABNORMAL HIGH (ref 70–99)
Glucose-Capillary: 349 mg/dL — ABNORMAL HIGH (ref 70–99)

## 2020-11-08 LAB — MAGNESIUM: Magnesium: 1.9 mg/dL (ref 1.7–2.4)

## 2020-11-08 LAB — LACTIC ACID, PLASMA: Lactic Acid, Venous: 0.7 mmol/L (ref 0.5–1.9)

## 2020-11-08 LAB — PROCALCITONIN: Procalcitonin: 21.86 ng/mL

## 2020-11-08 MED ORDER — ENSURE ENLIVE PO LIQD
237.0000 mL | Freq: Three times a day (TID) | ORAL | Status: DC
  Administered 2020-11-08 – 2020-11-12 (×12): 237 mL via ORAL

## 2020-11-08 MED ORDER — ADULT MULTIVITAMIN W/MINERALS CH
1.0000 | ORAL_TABLET | Freq: Every day | ORAL | Status: DC
  Administered 2020-11-08 – 2020-11-12 (×5): 1 via ORAL
  Filled 2020-11-08 (×4): qty 1

## 2020-11-08 MED ORDER — TRAZODONE HCL 50 MG PO TABS
25.0000 mg | ORAL_TABLET | Freq: Every evening | ORAL | Status: DC | PRN
  Administered 2020-11-08: 25 mg via ORAL
  Filled 2020-11-08: qty 1

## 2020-11-08 MED ORDER — BISACODYL 10 MG RE SUPP
10.0000 mg | Freq: Once | RECTAL | Status: AC
  Administered 2020-11-08: 10 mg via RECTAL
  Filled 2020-11-08: qty 1

## 2020-11-08 MED ORDER — ENOXAPARIN SODIUM 30 MG/0.3ML ~~LOC~~ SOLN
30.0000 mg | SUBCUTANEOUS | Status: DC
  Administered 2020-11-09 – 2020-11-12 (×4): 30 mg via SUBCUTANEOUS
  Filled 2020-11-08 (×4): qty 0.3

## 2020-11-08 MED ORDER — POLYETHYLENE GLYCOL 3350 17 G PO PACK
17.0000 g | PACK | Freq: Every day | ORAL | Status: DC
  Administered 2020-11-08 – 2020-11-12 (×5): 17 g via ORAL
  Filled 2020-11-08 (×5): qty 1

## 2020-11-08 NOTE — Progress Notes (Signed)
Initial Nutrition Assessment  DOCUMENTATION CODES:   Underweight (suspect PCM)  INTERVENTION:  Ensure Enlive po TID, each supplement provides 350 kcal and 20 grams of protein (per pt request)  MVI with minerals daily  Downgrade to DYS 3 (chopped meats) for ease of intake  Education provided  Will mail Ensure coupons and "Tips for Increasing Calories and Protein"  handout upon return to office on 1/11  NUTRITION DIAGNOSIS:   Increased nutrient needs related to chronic illness (COPD) as evidenced by estimated needs.    GOAL:   Patient will meet greater than or equal to 90% of their needs    MONITOR:   Labs,I & O's,Supplement acceptance,PO intake,Weight trends  REASON FOR ASSESSMENT:   Consult Assessment of nutrition requirement/status  ASSESSMENT:   70 year old female admitted for sepsis secondary to pneumonia presented with worsening shortness of breath. Past medical history significant of COPD, HTN, CHF, depression, GERD and previous hospitalization for pneumonia secondary to COVID-19 infection in August 2021.  RD working remotely.  Able to speak with pt via phone this afternoon. She reports good appetite, enjoyed having breakfast for dinner, recalls 100% of  french toast, oatmeal with brown sugar and cinnamon, milk, and currently waiting on a cup of coffee. Reports drinking an Ensure after her dinner meal and has requested to receive TID during admission, says she likes all flavors. She has not been eating well at home secondary to dislike/poor toleration of types of food her daughter prepares. Her daughter prepares mostly [place], which does not appeal to her and often too spicy. Patient recalls liking "real" cheeseburgers, chicken, steak, pork chops, fruit. She does not want to ask daughter to prepare alternate meals for her. RD suggested asking daughter to set aside a portion of meat prior to seasoning and having sides that are easily prepared and more to her  liking (instant mashed pots, rice, vegetables) Educated on types of supplements and encouraged drinking at home, RD will send coupons upon return to office on 1/11, address confirmed with patient. Discussed strategies for increasing calories and protein as will mail handout.    Per chart, weights stable over the last 5 months. Patient appears chronically underweight, 86-93 lbs over the past 2 years. Highly suspect malnutrition given weight history, poor home po and medical history significant for COPD,however unable to identify at this time, will plan to complete exam at follow-up.  Medications reviewed and include: SSI, Protonix, Miralax, Prednisone, Levaquin, Flagyl, Vancomycin  Labs: CBGs 349,109,145, BUN 32 (H), WBC 31.5 (H)  NUTRITION - FOCUSED PHYSICAL EXAM:  Unable to complete at this time, RD working remotely.  Diet Order:   Diet Order            Diet Heart Room service appropriate? Yes; Fluid consistency: Thin  Diet effective now                 EDUCATION NEEDS:   Education needs have been addressed  Skin:  Skin Assessment: Reviewed RN Assessment  Last BM:  1/4  Height:   Ht Readings from Last 1 Encounters:  11/06/20 5\' 3"  (1.6 m)    Weight:   Wt Readings from Last 1 Encounters:  11/06/20 43.1 kg    BMI:  Body mass index is 16.83 kg/m.  Estimated Nutritional Needs:   Kcal:  1400-1600  Protein:  65-73  Fluid:  >1.2 L   Lajuan Lines, RD, LDN Clinical Nutrition After Hours/Weekend Pager # in [email or web address]

## 2020-11-08 NOTE — Progress Notes (Signed)
  Echocardiogram 2D Echocardiogram has been performed.  Megan [name] 11/08/2020, 5:42 PM

## 2020-11-08 NOTE — Progress Notes (Signed)
PROGRESS NOTE    Megan [name]  MWU:[id number] DOB: 1951-08-23 DOA: 11/06/2020 PCP: [name], MD   Brief Narrative:  Megan [name] a 70 y.o.femalewithknown history of COPD, hypertension admitted in August 2021 for pneumonia secondary to COVID-19 at that time patient was noted to have cavitary lesions in the lung also has a history of esophageal dysmotility has not followed up with GI presents to the ER because of worsening shortness of breath. She was noted to have right-sided consolidation with pleural effusion on chest x-ray. CT chest was obtained to further assess the effusion and presence of possible cavitary pneumonia and this was confirmed with questionable necrotizing pneumonia noted. PCCM has evaluated patient with plans to continue Levaquin, vancomycin, and Flagyl as otherwise ordered.  Assessment & Plan:   Active Problems:   COPD (chronic obstructive pulmonary disease) ([place])   Hyperglycemia   Sepsis ([place])   CAP (community acquired pneumonia)   Acute respiratory failure with hypoxia ([place])   1. Sepsis secondary to cavitary right lower lobe pneumonia with questionable necrotizing pneumonia; improving -continue on Levaquin, vancomycin, and Flagyl. Appreciate PCCM evaluation and management. MRSA PCR positive.  Procalcitonin is downward trending.  Continue to check in a.m. along with CBC. 2. Acute on chronic hypoxemic and hypercapnic respiratory failure secondary to above-improved. Currently near 3 L nasal cannula which is apparently her baseline. 3. History of hypertension -currently stable. Home diltiazem resumed. Heart rates are improving. 4. History of recent esophageal dysmotility. SLP evaluation with no issues currently noted.  Started on regular diet. 5. History of hyperglycemia last hemoglobin A1c was around 7.4 in August 2021. Patient is on chronic steroids.  Current hemoglobin A1c is 6.7%. Continue current SSI. 6. Anemia appears to be chronic follow CBC which is  currently stable. 7. Mildly elevated BNP levels. Check 2D echo-still pending. 8. Constipation.  MiraLAX daily started. 9. Low albumin.  Suspect protein calorie malnutrition.  Obtain dietary consultation.    DVT prophylaxis: Lovenox Code Status: Full Family Communication: Tried calling daughter; wrong number noted on mobile 1/7; did not pick up home phone Disposition Plan:  Status is: Inpatient  Remains inpatient appropriate because:Ongoing diagnostic testing needed not appropriate for outpatient work up, IV treatments appropriate due to intensity of illness or inability to take PO and Inpatient level of care appropriate due to severity of illness   Dispo: The patient is from: Home  Anticipated d/c is to: Home  Anticipated d/c date is: 3 days  Patient currently is not medically stable to d/c. Patient requires aggressive treatment with antibiotics as ordered for sepsis secondary to cavitary right lower lobe pneumonia.  Continue to follow labs to include CBC and procalcitonin in a.m. prior to weaning off IV antibiotics.   Consultants:   PCCM  Procedures:   See below  Antimicrobials:  Anti-infectives (From admission, onward)   Start     Dose/Rate Route Frequency Ordered Stop   11/08/20 2000  levofloxacin (LEVAQUIN) IVPB 750 mg        750 mg 100 mL/hr over 90 Minutes Intravenous Every 48 hours 11/07/20 0149 11/12/20 1959   11/08/20 0930  vancomycin (VANCOREADY) IVPB 750 mg/150 mL        750 mg 150 mL/hr over 60 Minutes Intravenous Every 36 hours 11/06/20 2130     11/07/20 0800  metroNIDAZOLE (FLAGYL) IVPB 500 mg        500 mg 100 mL/hr over 60 Minutes Intravenous Every 8 hours 11/07/20 0744     11/07/20 0145  levofloxacin (  LEVAQUIN) IVPB 750 mg  Status:  Discontinued        750 mg 100 mL/hr over 90 Minutes Intravenous Every 24 hours 11/07/20 0136 11/07/20 0148   11/06/20 2045  vancomycin (VANCOCIN) IVPB 1000 mg/200 mL premix         1,000 mg 200 mL/hr over 60 Minutes Intravenous  Once 11/06/20 2033 11/06/20 2230   11/06/20 2030  levofloxacin (LEVAQUIN) IVPB 750 mg        750 mg 100 mL/hr over 90 Minutes Intravenous  Once 11/06/20 2016 11/06/20 2230      Subjective: Patient seen and evaluated today with no new acute complaints or concerns. No acute concerns or events noted overnight.  She continues to have an ongoing cough and did not sleep too well overnight.  She is also noted to be constipated and states she has not had a bowel movement in 4 days.  Objective: Vitals:   11/07/20 2356 11/08/20 0341 11/08/20 0735 11/08/20 0823  BP: 121/63 (!) 141/61 (!) 115/52   Pulse: (!) 104 (!) 102 92   Resp: 20 20 16    Temp: 98.1 F (36.7 C) 98.2 F (36.8 C) 98.2 F (36.8 C)   TempSrc:   Oral   SpO2: 98% 94% 100% 95%  Weight:      Height:        Intake/Output Summary (Last 24 hours) at 11/08/2020 0950 Last data filed at 11/08/2020 0400 Gross per 24 hour  Intake 2296.6 ml  Output -  Net 2296.6 ml   Filed Weights   11/06/20 1929  Weight: 43.1 kg    Examination:  General exam: Appears calm and comfortable, thin Respiratory system: Clear to auscultation. Respiratory effort normal.  Currently on 3 L nasal cannula Cardiovascular system: S1 & S2 heard, RRR.  Gastrointestinal system: Abdomen is soft Central nervous system: Alert and awake Extremities: No edema Skin: No significant lesions noted Psychiatry: Flat affect.    Data Reviewed: I have personally reviewed following labs and imaging studies  CBC: Recent Labs  Lab 11/06/20 1948 11/06/20 2200 11/07/20 0423 11/08/20 0516  WBC  --  29.8* 31.9* 31.5*  NEUTROABS  --  26.0* 30.0*  --   HGB 14.3 10.5* 10.0* 9.7*  HCT 42.0 36.2 33.0* 33.1*  MCV  --  104.6* 99.4 99.1  PLT  --  432* 488* 161*   Basic Metabolic Panel: Recent Labs  Lab 11/06/20 1932 11/06/20 1948 11/07/20 0423 11/08/20 0516  NA 135 131* 134* 137  K 5.4* 5.2* 4.8 4.5  CL 80*  --   87* 89*  CO2 44*  --  35* 40*  GLUCOSE 147*  --  242* 180*  BUN 33*  --  34* 32*  CREATININE 1.21*  --  0.95 0.80  CALCIUM 9.7  --  8.7* 9.1  MG  --   --   --  1.9   GFR: Estimated Creatinine Clearance: 46.4 mL/min (by C-G formula based on SCr of 0.8 mg/dL). Liver Function Tests: Recent Labs  Lab 11/06/20 1932 11/07/20 0423  AST 12* 11*  ALT 9 8  ALKPHOS 128* 95  BILITOT 0.9 0.5  PROT 6.6 5.4*  ALBUMIN 2.7* 2.1*   No results for input(s): LIPASE, AMYLASE in the last 168 hours. No results for input(s): AMMONIA in the last 168 hours. Coagulation Profile: Recent Labs  Lab 11/06/20 2132  INR 1.4*   Cardiac Enzymes: No results for input(s): CKTOTAL, CKMB, CKMBINDEX, TROPONINI in the last 168 hours. BNP (last 3  results) No results for input(s): PROBNP in the last 8760 hours. HbA1C: Recent Labs    11/07/20 0423  HGBA1C 6.7*   CBG: Recent Labs  Lab 11/07/20 1241 11/07/20 1608 11/07/20 2110 11/07/20 2111 11/08/20 0733  GLUCAP 208* 299* 103* 116* 145*   Lipid Profile: No results for input(s): CHOL, HDL, LDLCALC, TRIG, CHOLHDL, LDLDIRECT in the last 72 hours. Thyroid Function Tests: No results for input(s): TSH, T4TOTAL, FREET4, T3FREE, THYROIDAB in the last 72 hours. Anemia Panel: No results for input(s): VITAMINB12, FOLATE, FERRITIN, TIBC, IRON, RETICCTPCT in the last 72 hours. Sepsis Labs: Recent Labs  Lab 11/06/20 2050 11/06/20 2151 11/07/20 0423 11/08/20 0516  PROCALCITON  --   --  32.74 21.86  LATICACIDVEN 2.0* 2.3* 1.6 0.7    Recent Results (from the past 240 hour(s))  Resp Panel by RT-PCR (Flu A&B, Covid) Nasopharyngeal Swab     Status: None   Collection Time: 11/06/20  7:35 PM   Specimen: Nasopharyngeal Swab; Nasopharyngeal(NP) swabs in vial transport medium  Result Value Ref Range Status   SARS Coronavirus 2 by RT PCR NEGATIVE NEGATIVE Final    Comment: (NOTE) SARS-CoV-2 target nucleic acids are NOT DETECTED.  The SARS-CoV-2 RNA is generally  detectable in upper respiratory specimens during the acute phase of infection. The lowest concentration of SARS-CoV-2 viral copies this assay can detect is 138 copies/mL. A negative result does not preclude SARS-Cov-2 infection and should not be used as the sole basis for treatment or other patient management decisions. A negative result may occur with  improper specimen collection/handling, submission of specimen other than nasopharyngeal swab, presence of viral mutation(s) within the areas targeted by this assay, and inadequate number of viral copies(<138 copies/mL). A negative result must be combined with clinical observations, patient history, and epidemiological information. The expected result is Negative.  Fact Sheet for Patients:  EntrepreneurPulse.com.au  Fact Sheet for Healthcare Providers:  IncredibleEmployment.be  This test is no t yet approved or cleared by the [place] FDA and  has been authorized for detection and/or diagnosis of SARS-CoV-2 by FDA under an Emergency Use Authorization (EUA). This EUA will remain  in effect (meaning this test can be used) for the duration of the COVID-19 declaration under Section 564(b)(1) of the Act, 21 U.S.C.section 360bbb-3(b)(1), unless the authorization is terminated  or revoked sooner.       Influenza A by PCR NEGATIVE NEGATIVE Final   Influenza B by PCR NEGATIVE NEGATIVE Final    Comment: (NOTE) The Xpert Xpress SARS-CoV-2/FLU/RSV plus assay is intended as an aid in the diagnosis of influenza from Nasopharyngeal swab specimens and should not be used as a sole basis for treatment. Nasal washings and aspirates are unacceptable for Xpert Xpress SARS-CoV-2/FLU/RSV testing.  Fact Sheet for Patients: EntrepreneurPulse.com.au  Fact Sheet for Healthcare Providers: IncredibleEmployment.be  This test is not yet approved or cleared by the [place] FDA  and has been authorized for detection and/or diagnosis of SARS-CoV-2 by FDA under an Emergency Use Authorization (EUA). This EUA will remain in effect (meaning this test can be used) for the duration of the COVID-19 declaration under Section 564(b)(1) of the Act, 21 U.S.C. section 360bbb-3(b)(1), unless the authorization is terminated or revoked.  Performed at [facility], [place] [address], [place], [place] [address]   MRSA PCR Screening     Status: Abnormal   Collection Time: 11/07/20  5:35 AM   Specimen: Nasopharyngeal  Result Value Ref Range Status   MRSA by PCR POSITIVE (  A) NEGATIVE Final    Comment:        The GeneXpert MRSA Assay (FDA approved for NASAL specimens only), is one component of a comprehensive MRSA colonization surveillance program. It is not intended to diagnose MRSA infection nor to guide or monitor treatment for MRSA infections. RESULT CALLED TO, READ BACK BY AND VERIFIED WITH: Herschel Senegal RN 8:35 11/07/20 ([email or web address]) Performed at [facility], [place] [address], [place], [place] [address]          Radiology Studies: CT CHEST WO CONTRAST  Result Date: 11/07/2020 CLINICAL DATA:  Pneumonia EXAM: CT CHEST WITHOUT CONTRAST TECHNIQUE: Multidetector CT imaging of the chest was performed following the standard protocol without IV contrast. COMPARISON:  06/11/2020 FINDINGS: Cardiovascular: No significant coronary artery calcification. Global cardiac size within normal limits. No pericardial effusion. The central pulmonary arteries are enlarged in keeping with changes of pulmonary arterial hypertension. Mild atherosclerotic calcification within the thoracic aorta. No aortic aneurysm. Mediastinum/Nodes: Thyroid unremarkable. No pathologic thoracic adenopathy. Esophagus is unremarkable. Lungs/Pleura: Moderate centrilobular emphysema with marked pulmonary hyperinflation. Mild biapical scarring. There is dense consolidation of the right lower lobe with scattered areas  of cavitation in keeping with lobar, necrotizing pneumonia. The right lower lobe pulmonary bronchi are impacted and occluded proximally. No pneumothorax or pleural effusion. Scattered airway impaction is seen within the left lower lobe. There is associated bronchial wall thickening in keeping with airway inflammation. 8 mm nodule within the right middle lobe, axial image # 97, and 6 mm subpleural nodule within the left lower lobe at axial image # 83 are new since prior examination and may be inflammatory in nature. Upper Abdomen: No acute abnormality Musculoskeletal: No acute bone abnormality peer IMPRESSION: Dense, near complete consolidation of the right lower lobe with cavitation in keeping with lobar, necrotizing pneumonia. Complete impaction of the right lower lobar airways. Moderate centrilobular emphysema with marked pulmonary hyperinflation. Morphologic changes of pulmonary arterial hypertension. Multiple pulmonary nodules, likely inflammatory given their relatively rapid development since prior examination. Aortic Atherosclerosis (ICD10-I70.0). Electronically Signed   By: Fidela Salisbury MD   On: 11/07/2020 02:19   DG Chest Port 1 View  Result Date: 11/06/2020 CLINICAL DATA:  Short of breath, hypoxia, respiratory distress EXAM: PORTABLE CHEST 1 VIEW COMPARISON:  08/07/2020 FINDINGS: 2 frontal views of the chest demonstrate dense right basilar consolidation with small right pleural effusion. No pneumothorax. Background emphysema again noted. Cardiac silhouette is stable. No acute bony abnormalities. IMPRESSION: 1. Dense right lower lobe pneumonia with small right parapneumonic effusion. Electronically Signed   By: Randa Ngo M.D.   On: 11/06/2020 20:10        Scheduled Meds: . aspirin EC  81 mg Oral Daily  . Chlorhexidine Gluconate Cloth  6 each Topical Q0600  . diltiazem  120 mg Oral Daily  . enoxaparin (LOVENOX) injection  40 mg Subcutaneous Q24H  . umeclidinium bromide  1 puff Inhalation  Daily   And  . fluticasone furoate-vilanterol  1 puff Inhalation Daily  . insulin aspart  0-9 Units Subcutaneous TID WC  . mupirocin ointment  1 application Nasal BID  . pantoprazole  40 mg Oral Daily  . polyethylene glycol  17 g Oral Daily  . predniSONE  40 mg Oral Q breakfast   Continuous Infusions: . levofloxacin (LEVAQUIN) IV    . metronidazole 500 mg (11/08/20 0806)  . vancomycin 750 mg (11/08/20 0948)     LOS: 2 days    Time spent: 35 minutes  Rudi Knippenberg Darleen Crocker, DO [facility]  If 7PM-7AM, please contact night-coverage [email or web address] 11/08/2020, 9:50 AM

## 2020-11-09 DIAGNOSIS — R739 Hyperglycemia, unspecified: Secondary | ICD-10-CM | POA: Diagnosis not present

## 2020-11-09 DIAGNOSIS — J9601 Acute respiratory failure with hypoxia: Secondary | ICD-10-CM | POA: Diagnosis not present

## 2020-11-09 DIAGNOSIS — J189 Pneumonia, unspecified organism: Secondary | ICD-10-CM | POA: Diagnosis not present

## 2020-11-09 LAB — ECHOCARDIOGRAM COMPLETE
AR max vel: 2.2 cm2
AV Area VTI: 2.1 cm2
AV Area mean vel: 2.21 cm2
AV Mean grad: 4 mmHg
AV Peak grad: 7.7 mmHg
Ao pk vel: 1.39 m/s
Area-P 1/2: 5.23 cm2
Height: 63 in
S' Lateral: 2.7 cm
Weight: 1520.29 oz

## 2020-11-09 LAB — BASIC METABOLIC PANEL
Anion gap: 10 (ref 5–15)
BUN: 40 mg/dL — ABNORMAL HIGH (ref 8–23)
CO2: 39 mmol/L — ABNORMAL HIGH (ref 22–32)
Calcium: 9.3 mg/dL (ref 8.9–10.3)
Chloride: 88 mmol/L — ABNORMAL LOW (ref 98–111)
Creatinine, Ser: 0.74 mg/dL (ref 0.44–1.00)
GFR, Estimated: >60 mL/min (ref >60)
Glucose, Bld: 198 mg/dL — ABNORMAL HIGH (ref 70–99)
Potassium: 4.4 mmol/L (ref 3.5–5.1)
Sodium: 137 mmol/L (ref 135–145)

## 2020-11-09 LAB — CBC
HCT: 30.3 % — ABNORMAL LOW (ref 36.0–46.0)
Hemoglobin: 9.2 g/dL — ABNORMAL LOW (ref 12.0–15.0)
MCH: 29.5 pg (ref 26.0–34.0)
MCHC: 30.4 g/dL (ref 30.0–36.0)
MCV: 97.1 fL (ref 80.0–100.0)
Platelets: 498 10*3/uL — ABNORMAL HIGH (ref 150–400)
RBC: 3.12 MIL/uL — ABNORMAL LOW (ref 3.87–5.11)
RDW: 13.9 % (ref 11.5–15.5)
WBC: 21.3 10*3/uL — ABNORMAL HIGH (ref 4.0–10.5)
nRBC: 0 % (ref 0.0–0.2)

## 2020-11-09 LAB — GLUCOSE, CAPILLARY
Glucose-Capillary: 115 mg/dL — ABNORMAL HIGH (ref 70–99)
Glucose-Capillary: 132 mg/dL — ABNORMAL HIGH (ref 70–99)
Glucose-Capillary: 205 mg/dL — ABNORMAL HIGH (ref 70–99)
Glucose-Capillary: 215 mg/dL — ABNORMAL HIGH (ref 70–99)

## 2020-11-09 LAB — PROCALCITONIN: Procalcitonin: 14.22 ng/mL

## 2020-11-09 LAB — MAGNESIUM: Magnesium: 1.5 mg/dL — ABNORMAL LOW (ref 1.7–2.4)

## 2020-11-09 MED ORDER — OXYCODONE HCL 5 MG PO TABS
2.5000 mg | ORAL_TABLET | Freq: Four times a day (QID) | ORAL | Status: DC | PRN
  Administered 2020-11-09: 2.5 mg via ORAL
  Filled 2020-11-09: qty 1

## 2020-11-09 MED ORDER — LIDOCAINE 5 % EX PTCH
2.0000 | MEDICATED_PATCH | CUTANEOUS | Status: DC
  Administered 2020-11-09 – 2020-11-12 (×4): 2 via TRANSDERMAL
  Filled 2020-11-09 (×4): qty 2

## 2020-11-09 MED ORDER — BUTALBITAL-APAP-CAFFEINE 50-325-40 MG PO TABS
1.0000 | ORAL_TABLET | Freq: Two times a day (BID) | ORAL | Status: DC | PRN
  Administered 2020-11-09: 1 via ORAL
  Filled 2020-11-09 (×3): qty 1

## 2020-11-09 MED ORDER — VANCOMYCIN HCL IN DEXTROSE 1-5 GM/200ML-% IV SOLN
1000.0000 mg | INTRAVENOUS | Status: DC
  Administered 2020-11-09 – 2020-11-14 (×4): 1000 mg via INTRAVENOUS
  Filled 2020-11-09 (×5): qty 200

## 2020-11-09 MED ORDER — MAGNESIUM SULFATE 2 GM/50ML IV SOLN
2.0000 g | Freq: Once | INTRAVENOUS | Status: AC
  Administered 2020-11-09: 2 g via INTRAVENOUS
  Filled 2020-11-09: qty 50

## 2020-11-09 NOTE — Progress Notes (Signed)
PROGRESS NOTE    Megan [name]  ZOX:[id number] DOB: 08/25/1951 DOA: 11/06/2020 PCP: [name], MD   Brief Narrative:  Megan [name] a 70 y.o.femalewithknown history of COPD, hypertension admitted in August 2021 for pneumonia secondary to COVID-19 at that time patient was noted to have cavitary lesions in the lung also has a history of esophageal dysmotility has not followed up with GI presents to the ER because of worsening shortness of breath. She was noted to have right-sided consolidation with pleural effusion on chest x-ray. CT chest was obtained to further assess the effusion and presence of possible cavitary pneumonia and this was confirmed with questionable necrotizing pneumonia noted. PCCM has evaluated patient with plans to continue Levaquin, vancomycin, and Flagyl as otherwise ordered.  Assessment & Plan:   Active Problems:   COPD (chronic obstructive pulmonary disease) ([place])   Hyperglycemia   Sepsis ([place])   CAP (community acquired pneumonia)   Acute respiratory failure with hypoxia ([place])    Sepsis secondary to cavitary right lower lobe pneumonia with questionable necrotizing pneumonia; improving  -continue on Levaquin, vancomycin, and Flagyl. -PCCM: Would continue Vanc, Flagyl and Levofloxacin. Will de-escalate antibiotics based on culture data. She will need at least 21 days of treatment     Acute on chronic hypoxemic and hypercapnic respiratory failure secondary to above-improved. Currently near 3 L nasal cannula which is apparently her baseline.  History of hypertension -currently stable. Home diltiazem resumed. Heart rates are improving.  History of recent esophageal dysmotility. SLP evaluation with no issues currently noted.  Started on regular diet.  History of hyperglycemia last hemoglobin A1c was around 7.4 in August 2021. Patient is on chronic steroids.  Current hemoglobin A1c is 6.7%. Continue current SSI but most likely can stop since blood sugars  <150  Anemia appears to be chronic follow CBC which is currently stable.  Mildly elevated BNP levels. Check 2D echo-read still pending but echo done  Constipation.  MiraLAX daily started.  Hypomagnesemia -replete    DVT prophylaxis: Lovenox Code Status: Full Family Communication: no family at bedside Disposition Plan:  Status is: Inpatient  Remains inpatient appropriate because:Ongoing diagnostic testing needed not appropriate for outpatient work up, IV treatments appropriate due to intensity of illness or inability to take PO and Inpatient level of care appropriate due to severity of illness   Dispo: The patient is from: Home  Anticipated d/c is to: Home  Anticipated d/c date is: 3 days  Patient currently is not medically stable to d/c. Patient requires aggressive treatment with antibiotics as ordered for sepsis secondary to cavitary right lower lobe pneumonia.  Continue to follow labs to include CBC and procalcitonin in a.m. prior to weaning off IV antibiotics.   Consultants:   PCCM   Subjective: C/o pain with deep breathing   Objective: Vitals:   11/09/20 1230 11/09/20 1245 11/09/20 1300 11/09/20 1330  BP: 115/60 (!) 114/53 (!) 101/57 (!) 112/51  Pulse: (!) 104 (!) 101 95 94  Resp: 20 (!) 21 20 20   Temp:      TempSrc:      SpO2: 99% 99% 99% 99%  Weight:      Height:        Intake/Output Summary (Last 24 hours) at 11/09/2020 1337 Last data filed at 11/09/2020 0300 Gross per 24 hour  Intake 600 ml  Output 500 ml  Net 100 ml   Filed Weights   11/06/20 1929  Weight: 43.1 kg    Examination:   General: Appearance:  Thin female in no acute distress     Lungs:     Poor effort, diminished, respirations unlabored  Heart:    Normal heart rate. Normal rhythm. No murmurs, rubs, or gallops.   MS:   All extremities are intact.   Neurologic:   Awake, alert, oriented x 3. No apparent focal neurological            defect.     Data Reviewed: I have personally reviewed following labs and imaging studies  CBC: Recent Labs  Lab 11/06/20 1948 11/06/20 2200 11/07/20 0423 11/08/20 0516 11/09/20 0107  WBC  --  29.8* 31.9* 31.5* 21.3*  NEUTROABS  --  26.0* 30.0*  --   --   HGB 14.3 10.5* 10.0* 9.7* 9.2*  HCT 42.0 36.2 33.0* 33.1* 30.3*  MCV  --  104.6* 99.4 99.1 97.1  PLT  --  432* 488* 523* 034*   Basic Metabolic Panel: Recent Labs  Lab 11/06/20 1932 11/06/20 1948 11/07/20 0423 11/08/20 0516 11/09/20 0107  NA 135 131* 134* 137 137  K 5.4* 5.2* 4.8 4.5 4.4  CL 80*  --  87* 89* 88*  CO2 44*  --  35* 40* 39*  GLUCOSE 147*  --  242* 180* 198*  BUN 33*  --  34* 32* 40*  CREATININE 1.21*  --  0.95 0.80 0.74  CALCIUM 9.7  --  8.7* 9.1 9.3  MG  --   --   --  1.9 1.5*   GFR: Estimated Creatinine Clearance: 46.4 mL/min (by C-G formula based on SCr of 0.74 mg/dL). Liver Function Tests: Recent Labs  Lab 11/06/20 1932 11/07/20 0423  AST 12* 11*  ALT 9 8  ALKPHOS 128* 95  BILITOT 0.9 0.5  PROT 6.6 5.4*  ALBUMIN 2.7* 2.1*   No results for input(s): LIPASE, AMYLASE in the last 168 hours. No results for input(s): AMMONIA in the last 168 hours. Coagulation Profile: Recent Labs  Lab 11/06/20 2132  INR 1.4*   Cardiac Enzymes: No results for input(s): CKTOTAL, CKMB, CKMBINDEX, TROPONINI in the last 168 hours. BNP (last 3 results) No results for input(s): PROBNP in the last 8760 hours. HbA1C: Recent Labs    11/07/20 0423  HGBA1C 6.7*   CBG: Recent Labs  Lab 11/08/20 1134 11/08/20 1640 11/08/20 2343 11/09/20 0742 11/09/20 1128  GLUCAP 109* 349* 249* 115* 132*   Lipid Profile: No results for input(s): CHOL, HDL, LDLCALC, TRIG, CHOLHDL, LDLDIRECT in the last 72 hours. Thyroid Function Tests: No results for input(s): TSH, T4TOTAL, FREET4, T3FREE, THYROIDAB in the last 72 hours. Anemia Panel: No results for input(s): VITAMINB12, FOLATE, FERRITIN, TIBC, IRON, RETICCTPCT in the  last 72 hours. Sepsis Labs: Recent Labs  Lab 11/06/20 2050 11/06/20 2151 11/07/20 0423 11/08/20 0516 11/09/20 0107  PROCALCITON  --   --  32.74 21.86 14.22  LATICACIDVEN 2.0* 2.3* 1.6 0.7  --     Recent Results (from the past 240 hour(s))  Resp Panel by RT-PCR (Flu A&B, Covid) Nasopharyngeal Swab     Status: None   Collection Time: 11/06/20  7:35 PM   Specimen: Nasopharyngeal Swab; Nasopharyngeal(NP) swabs in vial transport medium  Result Value Ref Range Status   SARS Coronavirus 2 by RT PCR NEGATIVE NEGATIVE Final    Comment: (NOTE) SARS-CoV-2 target nucleic acids are NOT DETECTED.  The SARS-CoV-2 RNA is generally detectable in upper respiratory specimens during the acute phase of infection. The lowest concentration of SARS-CoV-2 viral copies this assay can detect is  138 copies/mL. A negative result does not preclude SARS-Cov-2 infection and should not be used as the sole basis for treatment or other patient management decisions. A negative result may occur with  improper specimen collection/handling, submission of specimen other than nasopharyngeal swab, presence of viral mutation(s) within the areas targeted by this assay, and inadequate number of viral copies(<138 copies/mL). A negative result must be combined with clinical observations, patient history, and epidemiological information. The expected result is Negative.  Fact Sheet for Patients:  EntrepreneurPulse.com.au  Fact Sheet for Healthcare Providers:  IncredibleEmployment.be  This test is no t yet approved or cleared by the [place] FDA and  has been authorized for detection and/or diagnosis of SARS-CoV-2 by FDA under an Emergency Use Authorization (EUA). This EUA will remain  in effect (meaning this test can be used) for the duration of the COVID-19 declaration under Section 564(b)(1) of the Act, 21 U.S.C.section 360bbb-3(b)(1), unless the authorization is terminated   or revoked sooner.       Influenza A by PCR NEGATIVE NEGATIVE Final   Influenza B by PCR NEGATIVE NEGATIVE Final    Comment: (NOTE) The Xpert Xpress SARS-CoV-2/FLU/RSV plus assay is intended as an aid in the diagnosis of influenza from Nasopharyngeal swab specimens and should not be used as a sole basis for treatment. Nasal washings and aspirates are unacceptable for Xpert Xpress SARS-CoV-2/FLU/RSV testing.  Fact Sheet for Patients: EntrepreneurPulse.com.au  Fact Sheet for Healthcare Providers: IncredibleEmployment.be  This test is not yet approved or cleared by the [place] FDA and has been authorized for detection and/or diagnosis of SARS-CoV-2 by FDA under an Emergency Use Authorization (EUA). This EUA will remain in effect (meaning this test can be used) for the duration of the COVID-19 declaration under Section 564(b)(1) of the Act, 21 U.S.C. section 360bbb-3(b)(1), unless the authorization is terminated or revoked.  Performed at [facility], [place] [address] [place], [place] [address]   Culture, blood (routine x 2)     Status: None (Preliminary result)   Collection Time: 11/06/20  9:40 PM   Specimen: BLOOD  Result Value Ref Range Status   Specimen Description BLOOD SITE NOT SPECIFIED  Final   Special Requests   Final    BOTTLES DRAWN AEROBIC AND ANAEROBIC Blood Culture adequate volume   Culture   Final    NO GROWTH 3 DAYS Performed at [facility], [address], [place], [place] [address]    Report Status PENDING  Incomplete  Culture, blood (routine x 2)     Status: None (Preliminary result)   Collection Time: 11/06/20 10:20 PM   Specimen: BLOOD LEFT HAND  Result Value Ref Range Status   Specimen Description BLOOD LEFT HAND [facility] VANC  Final   Special Requests   Final    BOTTLES DRAWN AEROBIC AND ANAEROBIC Blood Culture adequate volume   Culture   Final    NO GROWTH 2 DAYS Performed at [facility], [place] [address], [place], [place] [address]    Report Status PENDING  Incomplete  MRSA PCR Screening     Status: Abnormal   Collection Time: 11/07/20  5:35 AM   Specimen: Nasopharyngeal  Result Value Ref Range Status   MRSA by PCR POSITIVE (A) NEGATIVE Final    Comment:        The GeneXpert MRSA Assay (FDA approved for NASAL specimens only), is one component of a comprehensive MRSA colonization surveillance program. It is not intended to diagnose  MRSA infection nor to guide or monitor treatment for MRSA infections. RESULT CALLED TO, READ BACK BY AND VERIFIED WITH: Herschel Senegal RN 8:35 11/07/20 ([email or web address]) Performed at [facility], [place] [address] [place], [place] [address]          Radiology Studies: No results found.      Scheduled Meds: . aspirin EC  81 mg Oral Daily  . Chlorhexidine Gluconate Cloth  6 each Topical Q0600  . diltiazem  120 mg Oral Daily  . enoxaparin (LOVENOX) injection  30 mg Subcutaneous Q24H  . feeding supplement  237 mL Oral TID BM  . umeclidinium bromide  1 puff Inhalation Daily   And  . fluticasone furoate-vilanterol  1 puff Inhalation Daily  . insulin aspart  0-9 Units Subcutaneous TID WC  . lidocaine  2 patch Transdermal Q24H  . multivitamin with minerals  1 tablet Oral Daily  . mupirocin ointment  1 application Nasal BID  . pantoprazole  40 mg Oral Daily  . polyethylene glycol  17 g Oral Daily  . predniSONE  40 mg Oral Q breakfast   Continuous Infusions: . levofloxacin (LEVAQUIN) IV 750 mg (11/08/20 2017)  . metronidazole 500 mg (11/09/20 1108)  . vancomycin       LOS: 3 days    Time spent: 35 minutes    Geradine Girt, DO [facility]  If 7PM-7AM, please contact night-coverage [email or web address] 11/09/2020, 1:37 PM

## 2020-11-09 NOTE — Progress Notes (Signed)
Pharmacy Antibiotic Note  Megan [name] is a 70 y.o. female admitted on 11/06/2020 with cavitary pneumonia of the right lower lobe (questionable necrotizing PNA).  Pharmacy has been consulted for vancomycin dosing. Renal function has improved with Scr back to baseline at 0.74 and estimated CrCl of 46.30ml/min. Pt WBC and PCT are also downtrending today: WBC 21.3 and PCT 14.22 and she remains afebrile.   Of note: Patient has documented non-severe penicillin allergy, reported history of rash and flushed from cephalexin, has tolerated cefepime and ceftriaxone in the past.  Plan: Adjust vancomycin dose to 1000mg  q36hrs. Estimated AUC 500.3 with Scr 0.8 used for calculation Monitor renal function, CBC, PCT, culture results, clinical progress, de-escalation/length of therapy  Height: 5\' 3"  (160 cm) Weight: 43.1 kg (95 lb 0.3 oz) IBW/kg (Calculated) : 52.4  Temp (24hrs), Avg:97.8 F (36.6 C), Min:97.3 F (36.3 C), Max:98.3 F (36.8 C)  Recent Labs  Lab 11/06/20 1932 11/06/20 2050 11/06/20 2151 11/06/20 2200 11/07/20 0423 11/08/20 0516 11/09/20 0107  WBC  --   --   --  29.8* 31.9* 31.5* 21.3*  CREATININE 1.21*  --   --   --  0.95 0.80 0.74  LATICACIDVEN  --  2.0* 2.3*  --  1.6 0.7  --     Estimated Creatinine Clearance: 46.4 mL/min (by C-G formula based on SCr of 0.74 mg/dL).    Allergies  Allergen Reactions  . Penicillins Rash and Hives    Has patient had a PCN reaction causing immediate rash, facial/tongue/throat swelling, SOB or lightheadedness with hypotension: No Has patient had a PCN reaction causing severe rash involving mucus membranes or skin necrosis:NO Has patient had a PCN reaction that required hospitalization No Has patient had a PCN reaction occurring within the last 10 years:NO If all of the above answers are "NO", then may proceed with Cephalosporin use.  Marland Kitchen Morphine And Related Other (See Comments)    Made the patient incoherent and she "talked out her head"  .  Cephalexin Rash and Other (See Comments)    Flushed, also    Antimicrobials this admission: Levofloxacin 1/6> Flagyl 1/6> Vanc 1/6 >>    Dose adjustments this admission: Present dose adjustment: vancomycin 750mg  q36h to 1000mg  q36h  Microbiology results: 1/6 BCx: no growth to date 1/6 MRSA PCR: positive 1/6 resp panel: negative  Thank you for allowing pharmacy to be a part of this patient's care.  Wilson Singer, PharmD PGY1 Pharmacy Resident 11/09/2020 9:05 AM

## 2020-11-10 DIAGNOSIS — J9601 Acute respiratory failure with hypoxia: Secondary | ICD-10-CM | POA: Diagnosis not present

## 2020-11-10 DIAGNOSIS — J42 Unspecified chronic bronchitis: Secondary | ICD-10-CM | POA: Diagnosis not present

## 2020-11-10 DIAGNOSIS — J189 Pneumonia, unspecified organism: Secondary | ICD-10-CM | POA: Diagnosis not present

## 2020-11-10 LAB — BASIC METABOLIC PANEL
Anion gap: 9 (ref 5–15)
BUN: 21 mg/dL (ref 8–23)
CO2: 41 mmol/L — ABNORMAL HIGH (ref 22–32)
Calcium: 9 mg/dL (ref 8.9–10.3)
Chloride: 89 mmol/L — ABNORMAL LOW (ref 98–111)
Creatinine, Ser: 0.6 mg/dL (ref 0.44–1.00)
GFR, Estimated: >60 mL/min (ref >60)
Glucose, Bld: 145 mg/dL — ABNORMAL HIGH (ref 70–99)
Potassium: 5.4 mmol/L — ABNORMAL HIGH (ref 3.5–5.1)
Sodium: 139 mmol/L (ref 135–145)

## 2020-11-10 LAB — CBC
HCT: 34.3 % — ABNORMAL LOW (ref 36.0–46.0)
Hemoglobin: 10.2 g/dL — ABNORMAL LOW (ref 12.0–15.0)
MCH: 30 pg (ref 26.0–34.0)
MCHC: 29.7 g/dL — ABNORMAL LOW (ref 30.0–36.0)
MCV: 100.9 fL — ABNORMAL HIGH (ref 80.0–100.0)
Platelets: 480 10*3/uL — ABNORMAL HIGH (ref 150–400)
RBC: 3.4 MIL/uL — ABNORMAL LOW (ref 3.87–5.11)
RDW: 14.1 % (ref 11.5–15.5)
WBC: 17.6 10*3/uL — ABNORMAL HIGH (ref 4.0–10.5)
nRBC: 0 % (ref 0.0–0.2)

## 2020-11-10 LAB — GLUCOSE, CAPILLARY
Glucose-Capillary: 154 mg/dL — ABNORMAL HIGH (ref 70–99)
Glucose-Capillary: 110 mg/dL — ABNORMAL HIGH (ref 70–99)
Glucose-Capillary: 297 mg/dL — ABNORMAL HIGH (ref 70–99)
Glucose-Capillary: 229 mg/dL — ABNORMAL HIGH (ref 70–99)

## 2020-11-10 LAB — MAGNESIUM: Magnesium: 2 mg/dL (ref 1.7–2.4)

## 2020-11-10 MED ORDER — TRAZODONE HCL 50 MG PO TABS: 25.0000 mg | ORAL_TABLET | Freq: Every day | ORAL | Status: DC

## 2020-11-10 MED ORDER — SODIUM ZIRCONIUM CYCLOSILICATE 10 G PO PACK
10.0000 g | PACK | Freq: Once | ORAL | Status: AC
  Administered 2020-11-10: 10 g via ORAL
  Filled 2020-11-10: qty 1

## 2020-11-10 MED ORDER — TRAZODONE HCL 50 MG PO TABS
25.0000 mg | ORAL_TABLET | Freq: Every evening | ORAL | Status: DC | PRN
  Administered 2020-11-10 – 2020-11-11 (×2): 25 mg via ORAL
  Filled 2020-11-10 (×2): qty 1

## 2020-11-10 NOTE — Progress Notes (Signed)
NAME:  Megan [name], MRN:  [id number], DOB:  12-05-1950, LOS: 4 ADMISSION DATE:  11/06/2020, CONSULTATION DATE:  11/08/2019 REFERRING MD:  Dr Manuella Ghazi CHIEF COMPLAINT: Abnormal CT scan  Brief History:  70 year old female presents with acute respiratory distress with history of multiple pulmonary bacterial and viral infections.  Also has history of esophageal dysmotility.  PCCM consulted for further management of questionable right lower lobe necrotizing pneumonia.  No acute distress.  History of Present Illness:  Megan [name] is a 70 year old female with a past medical history significant for Covid pneumonia August 2021, prior MI, GERD, hypertension, COPD on chronic supplemental oxygen at baseline, congestive heart failure, and anxiety who presented to the emergency department with shortness of breath.  Patient states shortness of breath had improved after having Covid pneumonia August 2021.  She states that her supplemental oxygen post discharge was 2.5 liters continuously.  Of note patient was seen with questionable cavitary lesions in the left upper lobe but during that hospitalization.  Patient reports 2 days prior to this admission shortness of breath had worsened with productive cough as well.  She also reports subjective fever with chills and night sweats that began 2 days prior to admission.  On EMS arrival patient was seen hypoxic requiring placement of CPAP.  On ED arrival patient was transitioned to BiPAP to continue with increased work of breathing.  Vital signs significant on admission for temperature 100.1, tachycardia with a rate of 120-140, tachycardia and mild hypotension.  24.4, chloride 88, glucose 147, creatinine 1.21, high-sensitivity troponin 36, BNP 341.7, WBC 29.8, hemoglobin 10.5 and lactic acidosis 2.0.  Patient was admitted under hospitalist service for sepsis secondary to pneumonia and PCCM was consulted for further management  Past Medical History:  Admitted 11/07/2018.Covid  pneumonia August 2021, prior MI, GERD, hypertension, COPD on chronic supplemental oxygen at baseline, congestive heart failure, and anxiety   Significant Hospital Events:  Admitted 11/07/2018.  Consults:  Pulmonary  Procedures:  none  Significant Diagnostic Tests:  CT chest without contrast 11/07/2020 > Dense, near complete consolidation of the right lower lobe with cavitation in keeping with lobar, necrotizing pneumonia. Complete impaction of the right lower lobar airways. Moderate centrilobular emphysema with marked pulmonary hyperinflation. Multiple pulmonary nodules, likely inflammatory given their relatively rapid development since prior examination. Micro Data:  COVID 11/06/2020 > negative Blood cultures 11/06/2020 >  Culture 11/06/2020 >  MRSA PCR 11/07/2020 > positive Antimicrobials:   Levaquin 11/06/2020 Vancomycin 11/06/2020 Flagyl 11/06/2020 Interim History / Subjective:  Laying in bed, denies any significant complaints  Objective   Blood pressure (!) 138/58, pulse (!) 110, temperature 98.3 F (36.8 C), temperature source Axillary, resp. rate (!) 30, height 5\' 3"  (1.6 m), weight 43.1 kg, SpO2 97 %.        Intake/Output Summary (Last 24 hours) at 11/10/2020 0949 Last data filed at 11/10/2020 0500 Gross per 24 hour  Intake 500 ml  Output 700 ml  Net -200 ml   Filed Weights   11/06/20 1929  Weight: 43.1 kg    Examination: General: Frail, HENT: Moist oral mucosa Lungs: Decreased air movement bilaterally Cardiovascular: S1-S2 appreciated Abdomen: Bowel sounds appreciated Extremities: No clubbing, no edema Neuro: Sleepy GU:   Resolved Hospital Problem list     Assessment & Plan:  Septic secondary to necrotizing pneumonia -CT scan reviewed by myself showing dense consolidation at the right lower lobe -Extensive history of multiple bacterial and viral pulmonary infection  Chronic hypoxic and hypercapnic respiratory failure -Continue oxygen supplementation -Maintain  saturations greater than 89%  Very severe/end-stage COPD -Continue bronchodilators  History is esophageal dysmotility -Speech evaluation and management  Plan at the present time is to continue multiple antibiotics including Levaquin, vancomycin, Flagyl Following cultures -Vancomycin may be discontinued if no MRSA noted, MRSA PCR   Pulmonary hygiene Continue supplemental oxygen  No fever Leukocytosis improving CT scan of the chest reviewed by myself -Continue current lines of care  Best practice (evaluated daily)  Diet: Heart room service Pain/Anxiety/Delirium protocol (if indicated): As needed VAP protocol (if indicated): Not indicated DVT prophylaxis: SCD GI prophylaxis: Protonix Glucose control:  Mobility: As tolerated Disposition: MedSurg   Labs   CBC: Recent Labs  Lab 11/06/20 1948 11/06/20 2200 11/07/20 0423 11/08/20 0516 11/09/20 0107  WBC  --  29.8* 31.9* 31.5* 21.3*  NEUTROABS  --  26.0* 30.0*  --   --   HGB 14.3 10.5* 10.0* 9.7* 9.2*  HCT 42.0 36.2 33.0* 33.1* 30.3*  MCV  --  104.6* 99.4 99.1 97.1  PLT  --  432* 488* 523* 498*    Basic Metabolic Panel: Recent Labs  Lab 11/06/20 1932 11/06/20 1948 11/07/20 0423 11/08/20 0516 11/09/20 0107 11/10/20 0350  NA 135 131* 134* 137 137  --   K 5.4* 5.2* 4.8 4.5 4.4  --   CL 80*  --  87* 89* 88*  --   CO2 44*  --  35* 40* 39*  --   GLUCOSE 147*  --  242* 180* 198*  --   BUN 33*  --  34* 32* 40*  --   CREATININE 1.21*  --  0.95 0.80 0.74  --   CALCIUM 9.7  --  8.7* 9.1 9.3  --   MG  --   --   --  1.9 1.5* 2.0   GFR: Estimated Creatinine Clearance: 46.4 mL/min (by C-G formula based on SCr of 0.74 mg/dL). Recent Labs  Lab 11/06/20 2050 11/06/20 2151 11/06/20 2200 11/07/20 0423 11/08/20 0516 11/09/20 0107  PROCALCITON  --   --   --  32.74 21.86 14.22  WBC  --   --  29.8* 31.9* 31.5* 21.3*  LATICACIDVEN 2.0* 2.3*  --  1.6 0.7  --     Liver Function Tests: Recent Labs  Lab 11/06/20 1932  11/07/20 0423  AST 12* 11*  ALT 9 8  ALKPHOS 128* 95  BILITOT 0.9 0.5  PROT 6.6 5.4*  ALBUMIN 2.7* 2.1*   No results for input(s): LIPASE, AMYLASE in the last 168 hours. No results for input(s): AMMONIA in the last 168 hours.  ABG    Component Value Date/Time   PHART 7.452 (H) 02/25/2018 0358   PCO2ART 74.5 (HH) 02/25/2018 0358   PO2ART 67.6 (L) 02/25/2018 0358   HCO3 51.1 (H) 11/06/2020 1948   TCO2 >50 (H) 11/06/2020 1948   ACIDBASEDEF 1.0 02/23/2017 1110   O2SAT 55.0 11/06/2020 1948     Coagulation Profile: Recent Labs  Lab 11/06/20 2132  INR 1.4*    Cardiac Enzymes: No results for input(s): CKTOTAL, CKMB, CKMBINDEX, TROPONINI in the last 168 hours.  HbA1C: Hgb A1c MFr Bld  Date/Time Value Ref Range Status  11/07/2020 04:23 AM 6.7 (H) 4.8 - 5.6 % Final    Comment:    (NOTE) Pre diabetes:          5.7%-6.4%  Diabetes:              >6.4%  Glycemic control for   <7.0% adults with diabetes  08/07/2020 12:17 PM 6.9 (H) 4.6 - 6.5 % Final    Comment:    Glycemic Control Guidelines for People with Diabetes:Non Diabetic:  <6%Goal of Therapy: <7%Additional Action Suggested:  >8%     CBG: Recent Labs  Lab 11/09/20 0742 11/09/20 1128 11/09/20 1630 11/09/20 2005 11/10/20 0746  GLUCAP 115* 132* 205* 215* 154*    Review of Systems:   Denies any significant symptoms at present  Past Medical History:  She,  has a past medical history of Anemia, Anxiety state (08/20/2015), CAP (community acquired pneumonia), CHF (congestive heart failure) ([place]), COPD (chronic obstructive pulmonary disease) ([place]), Depression, Essential hypertension (08/19/2015), GERD (gastroesophageal reflux disease), Headache, History of hiatal hernia, Myocardial infarction ([place]), and Shortness of breath dyspnea.   Surgical History:   Past Surgical History:  Procedure Laterality Date  . CARDIAC CATHETERIZATION    . CARDIAC CATHETERIZATION N/A 08/26/2015   Procedure: Left Heart Cath and  Coronary Angiography;  Surgeon: Jettie Booze, MD;  Location: [facility];  Service: Cardiovascular;  Laterality: N/A;  . ESOPHAGOGASTRODUODENOSCOPY N/A 04/18/2018   Procedure: ESOPHAGOGASTRODUODENOSCOPY (EGD);  Surgeon: Ladene Artist, MD;  Location[facility] END[facility]COPY;  Service: Endoscopy;  Laterality: N/A;  . tracheostomy       Social History:   reports that she quit smoking about 20 years ago. Her smoking use included cigarettes. She has a 30.00 pack-year smoking history. She has never used smokeless tobacco. She reports that she does not drink alcohol and does not use drugs.   Family History:  Her family history includes Cirrhosis in her mother; Depression in her mother; Heart disease in her father.   Allergies Allergies  Allergen Reactions  . Penicillins Rash and Hives    Has patient had a PCN reaction causing immediate rash, facial/tongue/throat swelling, SOB or lightheadedness with hypotension: No Has patient had a PCN reaction causing severe rash involving mucus membranes or skin necrosis:NO Has patient had a PCN reaction that required hospitalization No Has patient had a PCN reaction occurring within the last 10 years:NO If all of the above answers are "NO", then may proceed with Cephalosporin use.  Marland Kitchen Morphine And Related Other (See Comments)    Made the patient incoherent and she "talked out her head"  . Cephalexin Rash and Other (See Comments)    Flushed, also     Home Medications  Prior to Admission medications   Medication Sig Start Date End Date Taking? Authorizing Provider  albuterol (VENTOLIN HFA) 108 (90 Base) MCG/ACT inhaler INHALE 2 PUFFS BY MOUTH EVERY 6 HOURS AS NEEDED FOR WHEEZING OR SHORTNESS OF BREATH Patient taking differently: Inhale 2 puffs into the lungs every 6 (six) hours as needed for wheezing or shortness of breath. 08/07/20  Yes [name], MD  ALPRAZolam Duanne Moron) 0.25 MG tablet TAKE 1 TABLET BY MOUTH TWICE DAILY AS NEEDED Patient taking  differently: Take 0.25 mg by mouth 2 (two) times daily as needed for anxiety. 08/08/20  Yes [name], MD  aspirin EC 81 MG tablet Take 81 mg by mouth daily.   Yes [provider]  azelastine (ASTELIN) 0.1 % nasal spray Place 1 spray into both nostrils 2 (two) times daily. Use in each nostril as directed 06/12/19  Yes Martyn Ehrich, NP  Butalbital-APAP-Caffeine [phone] MG capsule Take 1 capsule by mouth 2 (two) times daily as needed for headache. 07/28/20  Yes [provider]  diltiazem (CARDIZEM CD) 120 MG 24 hr capsule Take 1 capsule (120 mg total)  by mouth daily. 06/18/20  Yes Thurnell Lose, MD  feeding supplement, ENSURE ENLIVE, (ENSURE ENLIVE) LIQD Take 237 mLs by mouth 4 (four) times daily. Patient taking differently: Take 237 mLs by mouth daily. 01/11/17  Yes Eugenie Filler, MD  fluticasone (FLONASE) 50 MCG/ACT nasal spray SHAKE LIQUID AND USE 2 SPRAYS IN EACH NOSTRIL DAILY Patient taking differently: Place 2 sprays into both nostrils daily as needed for allergies. 07/25/19  Yes Collene Gobble, MD  guaiFENesin (MUCINEX) 600 MG 12 hr tablet Take 2 tablets (1,200 mg total) by mouth 2 (two) times daily. Patient taking differently: Take 1,200 mg by mouth 2 (two) times daily as needed for cough or to loosen phlegm. 12/10/19  Yes Martyn Ehrich, NP  hydrochlorothiazide (MICROZIDE) 12.5 MG capsule Take 1 capsule (12.5 mg total) by mouth daily. 08/07/20 08/07/21 Yes [name], MD  ipratropium-albuterol (DUONEB) 0.5-2.5 (3) MG/3ML SOLN Take 3 mLs by nebulization every 6 (six) hours as needed. 08/07/20  Yes [name], MD  loratadine (CLARITIN) 10 MG tablet Take 1 tablet (10 mg total) by mouth daily. Patient taking differently: Take 10 mg by mouth daily as needed for allergies. 11/14/19  Yes [name], MD  Multiple Vitamin (TAB-A-VITE/BETA CAROTENE) TABS Take 1 tablet by mouth daily. 01/01/14  Yes [provider]  ondansetron (ZOFRAN) 4 MG tablet Take 1  tablet (4 mg total) by mouth every 8 (eight) hours as needed for nausea or vomiting. 06/17/20  Yes Thurnell Lose, MD  pantoprazole (PROTONIX) 40 MG tablet TAKE 1 TABLET(40 MG) BY MOUTH DAILY Patient taking differently: Take 40 mg by mouth daily. 12/10/19  Yes [name], MD  polyethylene glycol [facility] / Floria Raveling) packet Take 17 g by mouth 2 (two) times daily. Patient taking differently: Take 17 g by mouth daily as needed for mild constipation. 01/11/17  Yes Eugenie Filler, MD  predniSONE (DELTASONE) 20 MG tablet Take 1 tablet (20 mg total) by mouth daily with breakfast. 08/13/20  Yes Byrum, Rose Fillers, MD  senna-docusate (SENOKOT-S) 8.6-50 MG tablet Take 1 tablet by mouth at bedtime as needed for mild constipation. 06/17/20  Yes Thurnell Lose, MD  TRELEGY ELLIPTA 100-62.5-25 MCG/INH AEPB INHALE 1 PUFF INTO THE LUNGS DAILY 02/26/20  Yes Collene Gobble, MD  lidocaine (LIDODERM) 5 % Place 1 patch onto the skin daily. Remove & Discard patch within 12 hours or asd Patient not taking: No sig reported 07/17/20   [name], MD     Sherrilyn Rist, MD [place] Pager: [phone]

## 2020-11-10 NOTE — Evaluation (Signed)
Physical Therapy Evaluation Patient Details Name: Megan [name] MRN: [id number] DOB: [birth year] Today's Date: 11/10/2020   History of Present Illness  Megan [name] is a 70 year old female with a past medical history significant for Covid pneumonia August 2021, prior MI, GERD, hypertension, COPD on chronic supplemental oxygen at baseline, congestive heart failure, and anxiety who presented to the emergency department with shortness of breath. Pt with esophageal dymotility and possible R Lower lobe necrotizing pneumonia.    Clinical Impression  Pt very lethargic and confused, unable to maintain eyes open and had limited capability to participate in PT eval. Pt poor historian, attempted to call all numbers in chart however unable to reach family. At this time pt requiring modA for mobility and maxA for ADLs due to limited ability to stay away, follow commands, and sequence. Pt at significant falls risk and is unable to return home unless 24/7 can be provided. Per chart and pt, dtr works and pt is by herself during the day. HR went up into 130s with mobility and SpO2 at 90% on 3LO2 via [place]. Recommend ST-snf upon d/c to allow for increased time to achieve maximal functional recovery prior to transition home     Follow Up Recommendations SNF;Supervision/Assistance - 24 hour    Equipment Recommendations   (TBD at next venue)    Recommendations for Other Services       Precautions / Restrictions Precautions Precautions: Fall Precaution Comments: confusion, watch SPo2 and HR Restrictions Weight Bearing Restrictions: No      Mobility  Bed Mobility Overal bed mobility: Needs Assistance Bed Mobility: Supine to Sit     Supine to sit: Min assist     General bed mobility comments: pt initiated transfer with max tactile cues, minA for trunk elevation, pt kept initiating return to supine requiring max verbal and tactile cues to stay at EOB    Transfers Overall transfer level: Needs  assistance Equipment used: 1 person hand held assist Transfers: Sit to/from [facility] Sit to Stand: Min assist;Mod assist Stand pivot transfers: Min assist;Mod assist       General transfer comment: max directional verbal and tactile cues, pt with trunk flextion but able to power up well, assist to weight shift when reaching for arm rest of chair during std pvt, increased time to process and sequence. HR up into 130s from 110s, noted SOB, SpO2 at 90% on 3LO2 via [place]  Ambulation/Gait             General Gait Details: unable as pt unable to stay awake  Stairs            Wheelchair Mobility    Modified Rankin (Stroke Patients Only)       Balance Overall balance assessment: Needs assistance Sitting-balance support: Feet supported;Bilateral upper extremity supported Sitting balance-Leahy Scale: Poor Sitting balance - Comments: unable to stay awake, pt falling anteriorly, posteriorly, and laterally   Standing balance support: Single extremity supported;During functional activity Standing balance-Leahy Scale: Poor Standing balance comment: dependent on physical assist                             Pertinent Vitals/Pain Pain Assessment: Faces Faces Pain Scale: Hurts a little bit Pain Location: generalized Pain Descriptors / Indicators: Grimacing    Home Living Family/patient expects to be discharged to:: Private residence Living Arrangements: Children Available Help at Discharge: Family;Available PRN/intermittently Type of Home: House Home Access: Stairs to enter Entrance Stairs-Rails: None  Entrance Stairs-Number of Steps: 2 Home Layout: One level Home Equipment: Cane - single point;Walker - 4 wheels;Other (comment) Additional Comments: home O2, 3LO2 via [place]    Prior Function Level of Independence: Needs assistance   Gait / Transfers Assistance Needed: unsure at pt poor historian and unable to reach family, suspect pt was using a  RW  ADL's / Homemaking Assistance Needed: usure but suspect pt was receiving some help with ADLs and IADLs  Comments: pt stated she used RW but couldn't stay awake long enough to provide home set up or PLOF     Hand Dominance        Extremity/Trunk Assessment   Upper Extremity Assessment Upper Extremity Assessment: Generalized weakness    Lower Extremity Assessment Lower Extremity Assessment: Generalized weakness    Cervical / Trunk Assessment Cervical / Trunk Assessment: Kyphotic  Communication   Communication: Expressive difficulties (unable to stay awake)  Cognition Arousal/Alertness: Lethargic Behavior During Therapy: Flat affect Overall Cognitive Status: Difficult to assess                                 General Comments: pt very sleepy, lethargic, unable to stay awake, pt requiring max verbal cues to follow commands and stay on task      General Comments General comments (skin integrity, edema, etc.): pt soiled in urine upon PT arrival, SPo2 at 90% on 3LO2 via [place], RR 30-40s, HR 110s-130s    Exercises     Assessment/Plan    PT Assessment Patient needs continued PT services  PT Problem List Decreased strength;Decreased activity tolerance;Decreased balance;Decreased mobility;Decreased cognition;Decreased knowledge of use of DME;Decreased safety awareness;Other (comment)       PT Treatment Interventions DME instruction;Gait training;Stair training;Therapeutic activities;Therapeutic exercise;Functional mobility training;Balance training;Neuromuscular re-education    PT Goals (Current goals can be found in the Care Plan section)  Acute Rehab PT Goals Patient Stated Goal: didn't state PT Goal Formulation: Patient unable to participate in goal setting Time For Goal Achievement: 11/24/20 Potential to Achieve Goals: Fair    Frequency Min 2X/week   Barriers to discharge Decreased caregiver support unable to reach family, per chart pt has PRN assist     Co-evaluation               AM-PAC PT "6 Clicks" Mobility  Outcome Measure Help needed turning from your back to your side while in a flat bed without using bedrails?: A Little Help needed moving from lying on your back to sitting on the side of a flat bed without using bedrails?: A Little Help needed moving to and from a bed to a chair (including a wheelchair)?: A Lot Help needed standing up from a chair using your arms (e.g., wheelchair or bedside chair)?: A Lot Help needed to walk in hospital room?: A Lot Help needed climbing 3-5 steps with a railing? : Total 6 Click Score: 13    End of Session Equipment Utilized During Treatment: Oxygen Activity Tolerance: Patient limited by lethargy Patient left: in chair;with call bell/phone within reach;with chair alarm set Nurse Communication: Mobility status PT Visit Diagnosis: Unsteadiness on feet (R26.81);Muscle weakness (generalized) (M62.81);Difficulty in walking, not elsewhere classified (R26.2)    Time: 4166-0630 PT Time Calculation (min) (ACUTE ONLY): 23 min   Charges:   PT Evaluation $PT Eval Moderate Complexity: 1 Mod PT Treatments $Therapeutic Activity: 8-22 mins        Shley Dolby, PT, DPT Acute Rehabilitation  Services Pager #: [phone] Office #: [phone]   Berline Lopes 11/10/2020, 10:39 AM

## 2020-11-10 NOTE — Care Management Important Message (Signed)
Important Message  Patient Details  Name: Megan [name] MRN: [id number] Date of Birth: [birth year]   Medicare Important Message Given:  Yes     Tyrisha Benninger Montine Circle 11/10/2020, 3:34 PM

## 2020-11-10 NOTE — Progress Notes (Signed)
PROGRESS NOTE    Megan [name]  VXB:[id number] DOB: 12-01-1950 DOA: 11/06/2020 PCP: [name], MD   Brief Narrative: 70 year old with known history of COPD, hypertension prior history of COVID-pneumonia August 2021, she was also noted to have at that time cavitary lesion in the lung, she has a history of esophageal dysmotility. She presented to the ER complaining of worsening shortness of breath.  Noted to have right-sided consolidation with pleural effusion on chest x-ray.  CT chest was obtained for further evaluation and showed presence of possible cavitary pneumonia and questionable necrotizing pneumonia noted.  CCM was consulted, recommendations are to continue with IV antibiotics Levaquin vancomycin and Flagyl.  Sputum culture ordered today.    Assessment & Plan:   Active Problems:   COPD (chronic obstructive pulmonary disease) ([place])   Hyperglycemia   Sepsis ([place])   CAP (community acquired pneumonia)   Acute respiratory failure with hypoxia ([place])  1-Sepsis secondary to cavitary right lower lobe pneumonia with questionable necrotizing  pneumonia: -Sputum culture ordered.  Results. -MRSA PCR +3 days ago -Continue with vancomycin, Flagyl and Levaquin. -Pulmonary following. -WBC Trending down today at 17.  2-Acute on chronic hypoxic and hypercapnic respiratory failure: Secondary to pneumonia: Continue with oxygen supplementation, on chronically 3 L at baseline.  3-History of hypertension: Continue with Diltiazem.  4-History of recent esophageal dysmotility: Evaluated for dysphagia without acute issue.  Continue regular diet 5-History of hyperglycemia: Last hemoglobin A1c 7.1 in August 2021.  Patient is on chronic steroids. Current hemoglobin 6.7 -Monitor  CBG  6-Anemia, probably of chronic disease: Monitor hemoglobin  7-Constipation; : Continue with MiraLAX 8-Chronic Diastolic dysfunction; Mildly elevated BNP; Normal EF, diastolic Dysfunction grade I. Compensated.   9-Hypomagnesemia; replaced.  10-COPD; on prednisone taper 40 mg daily for 4 days. Will need to resume Home dose prednisone.  11-mild hyperkalemia; will give one dose of lokelma.   Nutrition Problem: Increased nutrient needs Etiology: chronic illness (COPD)    Signs/Symptoms: estimated needs    Interventions: Ensure Enlive (each supplement provides 350kcal and 20 grams of protein),MVI,Education,Other (Comment) (will mail coupons for Ensure supplements)  Estimated body mass index is 16.83 kg/m as calculated from the following:   Height as of this encounter: 5\' 3"  (1.6 m).   Weight as of this encounter: 43.1 kg.   DVT prophylaxis: Lovenox Code Status: Full code Family Communication: Discussed with patient Disposition Plan:  Status is: Inpatient  Remains inpatient appropriate because:IV treatments appropriate due to intensity of illness or inability to take PO   Dispo: The patient is from: Home              Anticipated d/c is to: Home              Anticipated d/c date is: 3 days              Patient currently is not medically stable to d/c.  Plan to continue with IV antibiotics for cavitary pneumonia        Consultants:   Pulmonologist  Procedures:   None   Antimicrobials:  Levaquin Vancomycin.  Flagyl  Subjective: Patient is alert, report breathing a little bit better reports productive cough  Objective: Vitals:   11/09/20 1942 11/09/20 2303 11/10/20 0321 11/10/20 0747  BP: (!) 115/55 128/63 (!) 126/51 (!) 138/58  Pulse: (!) 103 100  (!) 110  Resp: 20 18 18  (!) 30  Temp: 97.6 F (36.4 C) 97.9 F (36.6 C) 97.7 F (36.5 C) 98.3 F (36.8 C)  TempSrc: Axillary Axillary Axillary Axillary  SpO2: 97% 100% 92% 97%  Weight:      Height:        Intake/Output Summary (Last 24 hours) at 11/10/2020 0807 Last data filed at 11/10/2020 0500 Gross per 24 hour  Intake 500 ml  Output 700 ml  Net -200 ml   Filed Weights   11/06/20 1929  Weight: 43.1 kg     Examination:  General exam: Appears calm and comfortable  Respiratory system: Mild tachypnea, bilateral rhonchi Cardiovascular system: S1 & S2 heard, RRR. No JVD, murmurs, rubs, gallops or clicks. No pedal edema. Gastrointestinal system: Abdomen is nondistended, soft and nontender. No organomegaly or masses felt. Normal bowel sounds heard. Central nervous system: Alert and oriented.  Follow commands Extremities: Symmetric 5 x 5 power.   Data Reviewed: I have personally reviewed following labs and imaging studies  CBC: Recent Labs  Lab 11/06/20 1948 11/06/20 2200 11/07/20 0423 11/08/20 0516 11/09/20 0107  WBC  --  29.8* 31.9* 31.5* 21.3*  NEUTROABS  --  26.0* 30.0*  --   --   HGB 14.3 10.5* 10.0* 9.7* 9.2*  HCT 42.0 36.2 33.0* 33.1* 30.3*  MCV  --  104.6* 99.4 99.1 97.1  PLT  --  432* 488* 523* 470*   Basic Metabolic Panel: Recent Labs  Lab 11/06/20 1932 11/06/20 1948 11/07/20 0423 11/08/20 0516 11/09/20 0107 11/10/20 0350  NA 135 131* 134* 137 137  --   K 5.4* 5.2* 4.8 4.5 4.4  --   CL 80*  --  87* 89* 88*  --   CO2 44*  --  35* 40* 39*  --   GLUCOSE 147*  --  242* 180* 198*  --   BUN 33*  --  34* 32* 40*  --   CREATININE 1.21*  --  0.95 0.80 0.74  --   CALCIUM 9.7  --  8.7* 9.1 9.3  --   MG  --   --   --  1.9 1.5* 2.0   GFR: Estimated Creatinine Clearance: 46.4 mL/min (by C-G formula based on SCr of 0.74 mg/dL). Liver Function Tests: Recent Labs  Lab 11/06/20 1932 11/07/20 0423  AST 12* 11*  ALT 9 8  ALKPHOS 128* 95  BILITOT 0.9 0.5  PROT 6.6 5.4*  ALBUMIN 2.7* 2.1*   No results for input(s): LIPASE, AMYLASE in the last 168 hours. No results for input(s): AMMONIA in the last 168 hours. Coagulation Profile: Recent Labs  Lab 11/06/20 2132  INR 1.4*   Cardiac Enzymes: No results for input(s): CKTOTAL, CKMB, CKMBINDEX, TROPONINI in the last 168 hours. BNP (last 3 results) No results for input(s): PROBNP in the last 8760 hours. HbA1C: No  results for input(s): HGBA1C in the last 72 hours. CBG: Recent Labs  Lab 11/09/20 0742 11/09/20 1128 11/09/20 1630 11/09/20 2005 11/10/20 0746  GLUCAP 115* 132* 205* 215* 154*   Lipid Profile: No results for input(s): CHOL, HDL, LDLCALC, TRIG, CHOLHDL, LDLDIRECT in the last 72 hours. Thyroid Function Tests: No results for input(s): TSH, T4TOTAL, FREET4, T3FREE, THYROIDAB in the last 72 hours. Anemia Panel: No results for input(s): VITAMINB12, FOLATE, FERRITIN, TIBC, IRON, RETICCTPCT in the last 72 hours. Sepsis Labs: Recent Labs  Lab 11/06/20 2050 11/06/20 2151 11/07/20 0423 11/08/20 0516 11/09/20 0107  PROCALCITON  --   --  32.74 21.86 14.22  LATICACIDVEN 2.0* 2.3* 1.6 0.7  --     Recent Results (from the past 240 hour(s))  Resp Panel by  RT-PCR (Flu A&B, Covid) Nasopharyngeal Swab     Status: None   Collection Time: 11/06/20  7:35 PM   Specimen: Nasopharyngeal Swab; Nasopharyngeal(NP) swabs in vial transport medium  Result Value Ref Range Status   SARS Coronavirus 2 by RT PCR NEGATIVE NEGATIVE Final    Comment: (NOTE) SARS-CoV-2 target nucleic acids are NOT DETECTED.  The SARS-CoV-2 RNA is generally detectable in upper respiratory specimens during the acute phase of infection. The lowest concentration of SARS-CoV-2 viral copies this assay can detect is 138 copies/mL. A negative result does not preclude SARS-Cov-2 infection and should not be used as the sole basis for treatment or other patient management decisions. A negative result may occur with  improper specimen collection/handling, submission of specimen other than nasopharyngeal swab, presence of viral mutation(s) within the areas targeted by this assay, and inadequate number of viral copies(<138 copies/mL). A negative result must be combined with clinical observations, patient history, and epidemiological information. The expected result is Negative.  Fact Sheet for Patients:   EntrepreneurPulse.com.au  Fact Sheet for Healthcare Providers:  IncredibleEmployment.be  This test is no t yet approved or cleared by the [place] FDA and  has been authorized for detection and/or diagnosis of SARS-CoV-2 by FDA under an Emergency Use Authorization (EUA). This EUA will remain  in effect (meaning this test can be used) for the duration of the COVID-19 declaration under Section 564(b)(1) of the Act, 21 U.S.C.section 360bbb-3(b)(1), unless the authorization is terminated  or revoked sooner.       Influenza A by PCR NEGATIVE NEGATIVE Final   Influenza B by PCR NEGATIVE NEGATIVE Final    Comment: (NOTE) The Xpert Xpress SARS-CoV-2/FLU/RSV plus assay is intended as an aid in the diagnosis of influenza from Nasopharyngeal swab specimens and should not be used as a sole basis for treatment. Nasal washings and aspirates are unacceptable for Xpert Xpress SARS-CoV-2/FLU/RSV testing.  Fact Sheet for Patients: EntrepreneurPulse.com.au  Fact Sheet for Healthcare Providers: IncredibleEmployment.be  This test is not yet approved or cleared by the [place] FDA and has been authorized for detection and/or diagnosis of SARS-CoV-2 by FDA under an Emergency Use Authorization (EUA). This EUA will remain in effect (meaning this test can be used) for the duration of the COVID-19 declaration under Section 564(b)(1) of the Act, 21 U.S.C. section 360bbb-3(b)(1), unless the authorization is terminated or revoked.  Performed at [facility], [place] [address] [place], [place] [address]   Culture, blood (routine x 2)     Status: None (Preliminary result)   Collection Time: 11/06/20  9:40 PM   Specimen: BLOOD  Result Value Ref Range Status   Specimen Description BLOOD SITE NOT SPECIFIED  Final   Special Requests   Final    BOTTLES DRAWN AEROBIC AND ANAEROBIC Blood Culture adequate volume   Culture    Final    NO GROWTH 3 DAYS Performed at [facility], [address], [place], [place] [address]    Report Status PENDING  Incomplete  Culture, blood (routine x 2)     Status: None (Preliminary result)   Collection Time: 11/06/20 10:20 PM   Specimen: BLOOD LEFT HAND  Result Value Ref Range Status   Specimen Description BLOOD LEFT HAND [facility] VANC  Final   Special Requests   Final    BOTTLES DRAWN AEROBIC AND ANAEROBIC Blood Culture adequate volume   Culture   Final    NO GROWTH 2 DAYS Performed at [facility],  [address] [place], [place] [address]    Report Status PENDING  Incomplete  MRSA PCR Screening     Status: Abnormal   Collection Time: 11/07/20  5:35 AM   Specimen: Nasopharyngeal  Result Value Ref Range Status   MRSA by PCR POSITIVE (A) NEGATIVE Final    Comment:        The GeneXpert MRSA Assay (FDA approved for NASAL specimens only), is one component of a comprehensive MRSA colonization surveillance program. It is not intended to diagnose MRSA infection nor to guide or monitor treatment for MRSA infections. RESULT CALLED TO, READ BACK BY AND VERIFIED WITH: Herschel Senegal RN 8:35 11/07/20 ([email or web address]) Performed at [facility], [place] [address], [place], [place] [address]          Radiology Studies: ECHOCARDIOGRAM COMPLETE  Result Date: 11/09/2020    ECHOCARDIOGRAM REPORT   Patient Name:   DANAJA LASOTA Date of Exam: 11/08/2020 Medical Rec #:  [id number]    Height:       63.0 in Accession #:    [id number]   Weight:       95.0 lb Date of Birth:  1950-11-22     BSA:          1.409 m Patient Age:    89 years     BP:           123/58 mmHg Patient Gender: F            HR:           105 bpm. Exam Location:  Inpatient Procedure: 2D Echo, Cardiac Doppler and Color Doppler Indications:    Dyspnea  History:        Patient has prior history of Echocardiogram examinations, most                 recent 02/23/2018. CHF, Previous Myocardial Infarction, COPD,                  Signs/Symptoms:Shortness of Breath; Risk Factors:Former Smoker.                 Orthopnea.  Sonographer:    Clayton Lefort RDCS (AE) Referring Phys: [place]  Sonographer Comments: Patient sitting at 40 degrees in bed due to orthopnea for test. IMPRESSIONS  1. Left ventricular ejection fraction, by estimation, is 60 to 65%. The left ventricle has normal function. The left ventricle has no regional wall motion abnormalities. There is mild concentric left ventricular hypertrophy. Left ventricular diastolic parameters are consistent with Grade I diastolic dysfunction (impaired relaxation).  2. Right ventricular systolic function is normal. The right ventricular size is normal. There is moderately elevated pulmonary artery systolic pressure.  3. The mitral valve is normal in structure. Trivial mitral valve regurgitation. No evidence of mitral stenosis.  4. The aortic valve is normal in structure. Aortic valve regurgitation is not visualized. No aortic stenosis is present.  5. The inferior vena cava is normal in size with greater than 50% respiratory variability, suggesting right atrial pressure of 3 mmHg. FINDINGS  Left Ventricle: Left ventricular ejection fraction, by estimation, is 60 to 65%. The left ventricle has normal function. The left ventricle has no regional wall motion abnormalities. The left ventricular internal cavity size was normal in size. There is  mild concentric left ventricular hypertrophy. Left ventricular diastolic parameters are consistent with Grade I diastolic dysfunction (impaired relaxation). Indeterminate filling pressures. Right Ventricle: The right ventricular size is normal. No increase in  right ventricular wall thickness. Right ventricular systolic function is normal. There is moderately elevated pulmonary artery systolic pressure. The tricuspid regurgitant velocity is 3.31 m/s, and with an assumed right atrial pressure of 3 mmHg, the estimated right ventricular systolic  pressure is 26.7 mmHg. Left Atrium: Left atrial size was normal in size. Right Atrium: Right atrial size was normal in size. Pericardium: There is no evidence of pericardial effusion. Mitral Valve: The mitral valve is normal in structure. Trivial mitral valve regurgitation. No evidence of mitral valve stenosis. MV peak gradient, 7.3 mmHg. The mean mitral valve gradient is 3.0 mmHg. Tricuspid Valve: The tricuspid valve is normal in structure. Tricuspid valve regurgitation is trivial. No evidence of tricuspid stenosis. Aortic Valve: The aortic valve is normal in structure. Aortic valve regurgitation is not visualized. No aortic stenosis is present. Aortic valve mean gradient measures 4.0 mmHg. Aortic valve peak gradient measures 7.7 mmHg. Aortic valve area, by VTI measures 2.10 cm. Pulmonic Valve: The pulmonic valve was normal in structure. Pulmonic valve regurgitation is not visualized. No evidence of pulmonic stenosis. Aorta: The aortic root is normal in size and structure. Venous: The inferior vena cava is normal in size with greater than 50% respiratory variability, suggesting right atrial pressure of 3 mmHg. IAS/Shunts: No atrial level shunt detected by color flow Doppler.  LEFT VENTRICLE PLAX 2D LVIDd:         3.80 cm  Diastology LVIDs:         2.70 cm  LV e' medial:    6.64 cm/s LV PW:         1.20 cm  LV E/e' medial:  9.8 LV IVS:        1.30 cm  LV e' lateral:   8.16 cm/s LVOT diam:     1.80 cm  LV E/e' lateral: 8.0 LV SV:         51 LV SV Index:   36 LVOT Area:     2.54 cm  RIGHT VENTRICLE             IVC RV Basal diam:  2.60 cm     IVC diam: 0.70 cm RV S prime:     16.00 cm/s TAPSE (M-mode): 2.3 cm LEFT ATRIUM             Index       RIGHT ATRIUM          Index LA diam:        2.80 cm 1.99 cm/m  RA Area:     9.34 cm LA Vol (A2C):   22.2 ml 15.75 ml/m RA Volume:   17.50 ml 12.42 ml/m LA Vol (A4C):   19.6 ml 13.91 ml/m LA Biplane Vol: 21.4 ml 15.18 ml/m  AORTIC VALVE AV Area (Vmax):    2.20 cm AV Area  (Vmean):   2.21 cm AV Area (VTI):     2.10 cm AV Vmax:           139.00 cm/s AV Vmean:          90.100 cm/s AV VTI:            0.242 m AV Peak Grad:      7.7 mmHg AV Mean Grad:      4.0 mmHg LVOT Vmax:         120.00 cm/s LVOT Vmean:        78.300 cm/s LVOT VTI:          0.200 m LVOT/AV VTI ratio: 0.83  AORTA Ao Root diam: 2.90 cm Ao Asc diam:  2.90 cm MITRAL VALVE               TRICUSPID VALVE MV Area (PHT): 5.23 cm    TR Peak grad:   43.8 mmHg MV Peak grad:  7.3 mmHg    TR Vmax:        331.00 cm/s MV Mean grad:  3.0 mmHg MV Vmax:       1.35 m/s    SHUNTS MV Vmean:      72.6 cm/s   Systemic VTI:  0.20 m MV Decel Time: 145 msec    Systemic Diam: 1.80 cm MV E velocity: 64.90 cm/s MV A velocity: 92.70 cm/s MV E/A ratio:  0.70 Skeet Latch MD Electronically signed by Skeet Latch MD Signature Date/Time: 11/09/2020/1:43:58 PM    Final         Scheduled Meds: . aspirin EC  81 mg Oral Daily  . Chlorhexidine Gluconate Cloth  6 each Topical Q0600  . diltiazem  120 mg Oral Daily  . enoxaparin (LOVENOX) injection  30 mg Subcutaneous Q24H  . feeding supplement  237 mL Oral TID BM  . umeclidinium bromide  1 puff Inhalation Daily   And  . fluticasone furoate-vilanterol  1 puff Inhalation Daily  . insulin aspart  0-9 Units Subcutaneous TID WC  . lidocaine  2 patch Transdermal Q24H  . multivitamin with minerals  1 tablet Oral Daily  . mupirocin ointment  1 application Nasal BID  . pantoprazole  40 mg Oral Daily  . polyethylene glycol  17 g Oral Daily  . predniSONE  40 mg Oral Q breakfast   Continuous Infusions: . levofloxacin (LEVAQUIN) IV 750 mg (11/08/20 2017)  . metronidazole 500 mg (11/09/20 2353)  . vancomycin 1,000 mg (11/09/20 2221)     LOS: 4 days    Time spent: 35 minutes    Johnetta Sloniker A Eriq Hufford, MD [facility]   If 7PM-7AM, please contact night-coverage [email or web address]  11/10/2020, 8:07 AM

## 2020-11-10 NOTE — NC FL2 (Cosign Needed)
[place] LEVEL OF CARE SCREENING TOOL     IDENTIFICATION  Patient Name: Megan [name] Birthdate: Dec 17, 1950 Sex: female Admission Date (Current Location): 11/06/2020  [place] and [place] Number:  Kathleen Argue [id number] [place] and Address:  [facility], [place] [address], [place], [place] [address]      Provider Number: [id number]  Attending Physician Name and Address:  Elmarie Shiley, MD  Relative Name and Phone Number:  Randel Pigg Daughter [phone]    Current Level of Care: Hospital Recommended Level of Care: [place] Prior Approval Number:    Date Approved/Denied:   PASRR Number: [id number]  Discharge Plan: SNF    Current Diagnoses: Patient Active Problem List   Diagnosis Date Noted  . Acute respiratory failure with hypoxia ([place]) 11/07/2020  . CAP (community acquired pneumonia) 11/06/2020  . Vitamin D deficiency 08/10/2020  . Venous insufficiency 08/07/2020  . Pressure injury of skin 06/17/2020  . Acute respiratory disease due to COVID-19 virus 06/11/2020  . Nail abnormality 08/07/2019  . COPD exacerbation ([place]) 04/19/2019  . Anemia 04/19/2019  . Urinary frequency 09/22/2018  . Chills without fever 09/22/2018  . Thoracic back pain 07/25/2018  . Low back pain 07/25/2018  . Coccyx pain 07/25/2018  . Fever 05/03/2018  . Food impaction of esophagus   . Abnormal esophagram   . Abdominal pain 04/16/2018  . Epigastric pain 04/16/2018  . Chest pain 04/16/2018  . Chronic respiratory failure with hypoxia ([place]) 04/16/2018  . Dysphagia 04/16/2018  . Odynophagia 04/16/2018  . Allergic rhinitis 03/31/2018  . Malnutrition of moderate degree 02/27/2018  . 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 Palliative care by specialist   . Acute on Chronic respiratory failure with hypoxia and SEVERE Hypercapnia ([place]) 02/13/2018  . Acute pain of right shoulder 01/07/2018  . Flu-like  symptoms 12/09/2017  . Fibromyalgia 09/16/2017  . Fatigue 09/16/2017  . Chronic respiratory failure with hypoxia and hypercapnia (HCC)   . Sepsis ([place])   . Hyperglycemia 01/05/2017  . Hair loss 06/22/2016  . Rash 06/22/2016  . Mixed headache 03/19/2016  . COPD (chronic obstructive pulmonary disease) ([place]) 03/19/2016  . Esophageal reflux 09/01/2015  . Goals of care, counseling/discussion   . Palliative care encounter   . Anxiety 08/20/2015  . Chronic post-traumatic stress disorder (PTSD) 08/20/2015  . Multiple pulmonary nodules determined by computed tomography of lung 08/19/2015  . Essential hypertension 08/19/2015  . Protein-calorie malnutrition, severe 08/19/2015    Orientation RESPIRATION BLADDER Height & Weight     Self,Time,Situation,Place  O2 Incontinent,External catheter Weight: 95 lb 0.3 oz (43.1 kg) Height:  5\' 3"  (160 cm)  BEHAVIORAL SYMPTOMS/MOOD NEUROLOGICAL BOWEL NUTRITION STATUS      Continent Diet (Heart diet.  See discharge summary)  AMBULATORY STATUS COMMUNICATION OF NEEDS Skin   Total Care Verbally Normal                       Personal Care Assistance Level of Assistance  Bathing,Feeding,Dressing Bathing Assistance: Maximum assistance Feeding assistance: Limited assistance Dressing Assistance: Maximum assistance     Functional Limitations Info  Sight,Hearing,Speech Sight Info: Adequate Hearing Info: Adequate Speech Info: Adequate    SPECIAL CARE FACTORS FREQUENCY  PT (By licensed PT),OT (By licensed OT)     PT Frequency: 5x week OT Frequency: 5x week            Contractures Contractures Info: Not present    Additional Factors Info  Code Status,Allergies,Insulin Sliding Scale Code Status Info: full Allergies Info: Penicillins, Morphine And Related, Cephalexin   Insulin Sliding Scale Info: novolog, 0-9 units, 3x day with meals       Current Medications (11/10/2020):  This is the current hospital active medication list Current  Facility-Administered Medications  Medication Dose Route Frequency Provider Last Rate Last Admin  . acetaminophen (TYLENOL) tablet 650 mg  650 mg Oral Q6H PRN Rise Patience, MD   650 mg at 11/08/20 2248   Or  . acetaminophen (TYLENOL) suppository 650 mg  650 mg Rectal Q6H PRN Rise Patience, MD      . ALPRAZolam Duanne Moron) tablet 0.25 mg  0.25 mg Oral BID PRN Heath Lark D, DO   0.25 mg at 11/08/20 1858  . aspirin EC tablet 81 mg  81 mg Oral Daily Rise Patience, MD   81 mg at 11/10/20 6269  . butalbital-acetaminophen-caffeine (FIORICET) 50-325-40 MG per tablet 1 tablet  1 tablet Oral BID PRN Eulogio Bear U, DO   1 tablet at 11/09/20 1119  . Chlorhexidine Gluconate Cloth 2 % PADS 6 each  6 each Topical Q0600 Heath Lark D, DO   6 each at 11/10/20 0536  . diltiazem (CARDIZEM CD) 24 hr capsule 120 mg  120 mg Oral Daily Merlene Laughter F, NP   120 mg at 11/10/20 0837  . enoxaparin (LOVENOX) injection 30 mg  30 mg Subcutaneous Q24H Wilson Singer I, RPH   30 mg at 11/10/20 4854  . feeding supplement (ENSURE ENLIVE / ENSURE PLUS) liquid 237 mL  237 mL Oral TID BM Shah, Pratik D, DO   237 mL at 11/10/20 6270  . umeclidinium bromide (INCRUSE ELLIPTA) 62.5 MCG/INH 1 puff  1 puff Inhalation Daily Shah, Pratik D, DO   1 puff at 11/10/20 0757   And  . fluticasone furoate-vilanterol (BREO ELLIPTA) 100-25 MCG/INH 1 puff  1 puff Inhalation Daily Shah, Pratik D, DO   1 puff at 11/10/20 0757  . insulin aspart (novoLOG) injection 0-9 Units  0-9 Units Subcutaneous TID WC Rise Patience, MD   2 Units at 11/10/20 [phone]  . levalbuterol (XOPENEX) nebulizer solution 0.63 mg  0.63 mg Nebulization Q6H PRN Manuella Ghazi, Pratik D, DO      . levofloxacin (LEVAQUIN) IVPB 750 mg  750 mg Intravenous Q48H Franky Macho, RPH 100 mL/hr at 11/08/20 2017 750 mg at 11/08/20 2017  . lidocaine (LIDODERM) 5 % 2 patch  2 patch Transdermal Q24H Eulogio Bear U, DO   2 patch at 11/10/20 1333  . metoprolol tartrate  (LOPRESSOR) injection 2.5 mg  2.5 mg Intravenous Q6H PRN Merlene Laughter F, NP   2.5 mg at 11/07/20 1243  . metroNIDAZOLE (FLAGYL) IVPB 500 mg  500 mg Intravenous Q8H Rise Patience, MD 100 mL/hr at 11/10/20 0832 500 mg at 11/10/20 0832  . multivitamin with minerals tablet 1 tablet  1 tablet Oral Daily Manuella Ghazi, Pratik D, DO   1 tablet at 11/10/20 [phone]  . mupirocin ointment (BACTROBAN) 2 % 1 application  1 application Nasal BID Heath Lark D, DO   1 application at 82/99/37 [phone]  . ondansetron (ZOFRAN) injection 4 mg  4 mg Intravenous Q6H PRN Zierle-Ghosh, Asia B, DO   4 mg at 11/07/20 2131  . oxyCODONE (Oxy IR/ROXICODONE) immediate release tablet 2.5 mg  2.5 mg Oral Q6H PRN Eulogio Bear U, DO   2.5 mg at 11/09/20 1708  . pantoprazole (PROTONIX) EC tablet 40 mg  40  mg Oral Daily Rise Patience, MD   40 mg at 11/10/20 5747  . polyethylene glycol (MIRALAX / GLYCOLAX) packet 17 g  17 g Oral Daily Manuella Ghazi, Pratik D, DO   17 g at 11/10/20 0837  . predniSONE (DELTASONE) tablet 40 mg  40 mg Oral Q breakfast Margaretha Seeds, MD   40 mg at 11/10/20 0836  . sodium zirconium cyclosilicate (LOKELMA) packet 10 g  10 g Oral Once Regalado, [place] A, MD      . traZODone (DESYREL) tablet 25 mg  25 mg Oral QHS PRN Regalado, Belkys A, MD      . vancomycin (VANCOCIN) IVPB 1000 mg/200 mL premix  1,000 mg Intravenous Q36H Wilson Singer I, RPH 200 mL/hr at 11/09/20 2221 1,000 mg at 11/09/20 2221     Discharge Medications: Please see discharge summary for a list of discharge medications.  Relevant Imaging Results:  Relevant Lab Results:   Additional Information SSN: [id number]  Joanne Chars, [place]

## 2020-11-10 NOTE — TOC Initial Note (Signed)
Transition of Care [facility]) - Initial/Assessment Note    Patient Details  Name: Megan [name] MRN: [id number] Date of Birth: [birth year]  [facility]) CM/SW Contact:    Megan Chars, LCSW Phone Number: 11/10/2020, 1:25 PM  Clinical Narrative:  CSW spoke with pt regarding discharge plan.  Pt wants to discuss with daughter before agreeing to SNF, permission given to speak with daughter Megan [name]. Wrong number in chart, correct phone number: [phone].  Choice document given.  Pt is vaccinated for covid.  Pt reports she has home O2 through [employer], no other equipment in home.  CSW spoke with daughter Megan [name] who is in agreement with plan for SNF and is going to speak with pt about long term care.                Expected Discharge Plan: Skilled Nursing Facility Barriers to Discharge: Continued Medical Work up,SNF Pending bed offer   Patient Goals and CMS Choice Patient states their goals for this hospitalization and ongoing recovery are:: "get back to the best of my abilities" CMS Medicare.gov Compare Post Acute Care list provided to:: Patient Choice offered to / list presented to : Patient  Expected Discharge Plan and Services Expected Discharge Plan: [place] Choice: [place] Living arrangements for the past 2 months: Single Family Home                                      Prior Living Arrangements/Services Living arrangements for the past 2 months: Single Family Home Lives with:: Adult Children,Relatives Patient language and need for interpreter reviewed:: Yes Do you feel safe going back to the place where you live?: Yes      Need for Family Participation in Patient Care: Yes (Comment) Care giver support system in place?: Yes (comment) Current home services: DME (O2 through [employer]) Criminal Activity/Legal Involvement Pertinent to Current Situation/Hospitalization: No - Comment as needed  Activities of Daily  Living      Permission Sought/Granted Permission sought to share information with : Family Chief Financial Officer Permission granted to share information with : Yes, Verbal Permission Granted  Share Information with NAME: daughter, Megan [name]  Permission granted to share info w AGENCY: SNF        Emotional Assessment Appearance:: Appears older than stated age Attitude/Demeanor/Rapport: Engaged Affect (typically observed): Appropriate,Pleasant Orientation: : Oriented to Self,Oriented to Place,Oriented to  Time,Oriented to Situation Alcohol / Substance Use: Not Applicable Psych Involvement: No (comment)  Admission diagnosis:  CAP (community acquired pneumonia) [J18.9] Sepsis ([place]) [A41.9] Community acquired pneumonia of right lower lobe of lung [J18.9] Patient Active Problem List   Diagnosis Date Noted  . Acute respiratory failure with hypoxia ([place]) 11/07/2020  . CAP (community acquired pneumonia) 11/06/2020  . Vitamin D deficiency 08/10/2020  . Venous insufficiency 08/07/2020  . Pressure injury of skin 06/17/2020  . Acute respiratory disease due to COVID-19 virus 06/11/2020  . Nail abnormality 08/07/2019  . COPD exacerbation ([place]) 04/19/2019  . Anemia 04/19/2019  . Urinary frequency 09/22/2018  . Chills without fever 09/22/2018  . Thoracic back pain 07/25/2018  . Low back pain 07/25/2018  . Coccyx pain 07/25/2018  . Fever 05/03/2018  . Food impaction of esophagus   . Abnormal esophagram   . Abdominal pain 04/16/2018  . Epigastric pain 04/16/2018  . Chest pain 04/16/2018  . Chronic respiratory failure  with hypoxia ([place]) 04/16/2018  . Dysphagia 04/16/2018  . Odynophagia 04/16/2018  . Allergic rhinitis 03/31/2018  . Malnutrition of moderate degree 02/27/2018  . 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 Palliative care by specialist   . Acute on Chronic respiratory failure with hypoxia and SEVERE Hypercapnia ([place])  02/13/2018  . Acute pain of right shoulder 01/07/2018  . Flu-like symptoms 12/09/2017  . Fibromyalgia 09/16/2017  . Fatigue 09/16/2017  . Chronic respiratory failure with hypoxia and hypercapnia (HCC)   . Sepsis ([place])   . Hyperglycemia 01/05/2017  . Hair loss 06/22/2016  . Rash 06/22/2016  . Mixed headache 03/19/2016  . COPD (chronic obstructive pulmonary disease) ([place]) 03/19/2016  . Esophageal reflux 09/01/2015  . Goals of care, counseling/discussion   . Palliative care encounter   . Anxiety 08/20/2015  . Chronic post-traumatic stress disorder (PTSD) 08/20/2015  . Multiple pulmonary nodules determined by computed tomography of lung 08/19/2015  . Essential hypertension 08/19/2015  . Protein-calorie malnutrition, severe 08/19/2015   PCP:  [name], MD Pharmacy:   [facility] [place], [place] [address] Phone: [phone] Fax: [phone]     Social Determinants of Health (SDOH) Interventions    Readmission Risk Interventions No flowsheet data found.

## 2020-11-11 DIAGNOSIS — J9601 Acute respiratory failure with hypoxia: Secondary | ICD-10-CM | POA: Diagnosis not present

## 2020-11-11 LAB — BASIC METABOLIC PANEL
Anion gap: 8 (ref 5–15)
BUN: 23 mg/dL (ref 8–23)
CO2: 42 mmol/L — ABNORMAL HIGH (ref 22–32)
Calcium: 8.5 mg/dL — ABNORMAL LOW (ref 8.9–10.3)
Chloride: 90 mmol/L — ABNORMAL LOW (ref 98–111)
Creatinine, Ser: 0.55 mg/dL (ref 0.44–1.00)
GFR, Estimated: >60 mL/min (ref >60)
Glucose, Bld: 142 mg/dL — ABNORMAL HIGH (ref 70–99)
Potassium: 5.1 mmol/L (ref 3.5–5.1)
Sodium: 140 mmol/L (ref 135–145)

## 2020-11-11 LAB — CBC
HCT: 32 % — ABNORMAL LOW (ref 36.0–46.0)
Hemoglobin: 8.7 g/dL — ABNORMAL LOW (ref 12.0–15.0)
MCH: 28.5 pg (ref 26.0–34.0)
MCHC: 27.2 g/dL — ABNORMAL LOW (ref 30.0–36.0)
MCV: 104.9 fL — ABNORMAL HIGH (ref 80.0–100.0)
Platelets: 399 10*3/uL (ref 150–400)
RBC: 3.05 MIL/uL — ABNORMAL LOW (ref 3.87–5.11)
RDW: 13.9 % (ref 11.5–15.5)
WBC: 13.4 10*3/uL — ABNORMAL HIGH (ref 4.0–10.5)
nRBC: 0 % (ref 0.0–0.2)

## 2020-11-11 LAB — CULTURE, BLOOD (ROUTINE X 2)
Culture: NO GROWTH
Special Requests: ADEQUATE

## 2020-11-11 LAB — GLUCOSE, CAPILLARY
Glucose-Capillary: 75 mg/dL (ref 70–99)
Glucose-Capillary: 224 mg/dL — ABNORMAL HIGH (ref 70–99)
Glucose-Capillary: 230 mg/dL — ABNORMAL HIGH (ref 70–99)
Glucose-Capillary: 156 mg/dL — ABNORMAL HIGH (ref 70–99)

## 2020-11-11 LAB — VITAMIN B12: Vitamin B-12: 244 pg/mL (ref 180–914)

## 2020-11-11 MED ORDER — SODIUM CHLORIDE 0.9 % IV SOLN
2.0000 g | Freq: Two times a day (BID) | INTRAVENOUS | Status: DC
  Administered 2020-11-11 – 2020-11-14 (×7): 2 g via INTRAVENOUS
  Filled 2020-11-11 (×8): qty 2

## 2020-11-11 NOTE — Progress Notes (Signed)
PROGRESS NOTE    Tracy Gerken  JZP:[id number]  DOB: 1951-01-28  DOA: 11/06/2020 PCP: [name], MD Outpatient Specialists:   Hospital course:  70 year old female with end-stage COPD, HTN, COVID-pneumonia August 2021 was admitted 11/06/2020 with worsening shortness of breath.  Work-up revealed right-sided consolidation with pleural effusion and CT showed cavitary pneumonia possible necrotizing pneumonia.  Patient initially needed CPAP and PCCM was consulted.  Patient however has done okay on HFNC and broad-spectrum antibiotics including Levaquin, vancomycin and Flagyl.   Subjective:  Patient has no new acute concerns.  She states the bed is quite uncomfortable for her.  Notes that she just cannot get comfortable.  States she is breathing okay as long as she keeps her oxygen on.  Continued cough as before.  Objective: Vitals:   11/10/20 2351 11/11/20 0418 11/11/20 0802 11/11/20 0831  BP: (!) 129/54 (!) 111/51 (!) 116/94 (!) 127/51  Pulse: (!) 108 97    Resp:   (!) 23   Temp: 98.2 F (36.8 C) 97.9 F (36.6 C) 98.5 F (36.9 C)   TempSrc: Oral Oral Oral   SpO2: 98% 96% 98%   Weight:      Height:        Intake/Output Summary (Last 24 hours) at 11/11/2020 1327 Last data filed at 11/11/2020 1000 Gross per 24 hour  Intake 640 ml  Output --  Net 640 ml   Filed Weights   11/06/20 1929  Weight: 43.1 kg     Exam:  General: Frail elderly female looking much older than stated age sitting up in bed with some unlabored tachypnea.  Able to speak in full sentences without difficulty. Eyes: sclera anicteric, conjuctiva mild injection bilaterally CVS: S1-S2, regular  Respiratory: Poor air entry throughout all her lung fields with some rare squeaks at right base.  No wheezes or rhonchi noted GI: NABS, soft, NT  LE: No edema.  Neuro: A/O x 3, Moving all extremities equally with normal strength, CN 3-12 intact, grossly nonfocal.  Psych: patient is logical and coherent, judgement and  insight appear normal, mood and affect appropriate to situation.   Assessment & Plan:   70 year old female with end-stage COPD status post COVID-pneumonia in August now presented with necrotizing/cavitary pneumonia.  Patient is PCR positive.  Acute hypoxic respiratory failure secondary to RLL cavitary pneumonia  Patient is on day 6 of Flagyl and vancomycin. She is PCR positive for MRSA Apparently her Levaquin had been discontinued automatically after 6 doses We will start her back on cefepime to cover Pseudomonas as she will likely need long-term treatment. She is leukocytosis continues to improve now down to 13.4 from peak of 31 And is breathing comfortably on 2.5 L [place]  COPD No evidence of acute flare at present however patient had been placed on quick steroid taper at home Prednisone was continued here since she was still on her taper. Patient is also placed on Incruse Ellipta and Breo Ellipta. Inhaled bronchodilators as needed  HFpEF No evidence for decompensation New present medication  DM2 Stable control on present management despite steroid use  HTN Continue diltiazem  Esophageal dysmotility Appreciate SLP evaluation, continue regular diet   DVT prophylaxis: Lovenox Lovenox Code Status: Full Family Communication: Patient states she is in contact with her family Disposition Plan:   Patient is from: Home  Anticipated Discharge Location: TBD  Barriers to Discharge: Ongoing dense necrotizing pneumonia  Is patient medically stable for Discharge: No   Consultants:  CCM  Procedures:  None  Antimicrobials:  Vancomycin and Flagyl day #6  Patient completed 6-day course of Levaquin  Cefepime started today 11/11/2020   Data Reviewed:  Basic Metabolic Panel: Recent Labs  Lab 11/07/20 0423 11/08/20 0516 11/09/20 0107 11/10/20 0350 11/10/20 0844 11/11/20 0429  NA 134* 137 137  --  139 140  K 4.8 4.5 4.4  --  5.4* 5.1  CL 87* 89* 88*  --  89* 90*  CO2  35* 40* 39*  --  41* 42*  GLUCOSE 242* 180* 198*  --  145* 142*  BUN 34* 32* 40*  --  21 23  CREATININE 0.95 0.80 0.74  --  0.60 0.55  CALCIUM 8.7* 9.1 9.3  --  9.0 8.5*  MG  --  1.9 1.5* 2.0  --   --    Liver Function Tests: Recent Labs  Lab 11/06/20 1932 11/07/20 0423  AST 12* 11*  ALT 9 8  ALKPHOS 128* 95  BILITOT 0.9 0.5  PROT 6.6 5.4*  ALBUMIN 2.7* 2.1*   No results for input(s): LIPASE, AMYLASE in the last 168 hours. No results for input(s): AMMONIA in the last 168 hours. CBC: Recent Labs  Lab 11/06/20 2200 11/07/20 0423 11/08/20 0516 11/09/20 0107 11/10/20 0844 11/11/20 0429  WBC 29.8* 31.9* 31.5* 21.3* 17.6* 13.4*  NEUTROABS 26.0* 30.0*  --   --   --   --   HGB 10.5* 10.0* 9.7* 9.2* 10.2* 8.7*  HCT 36.2 33.0* 33.1* 30.3* 34.3* 32.0*  MCV 104.6* 99.4 99.1 97.1 100.9* 104.9*  PLT 432* 488* 523* 498* 480* 399   Cardiac Enzymes: No results for input(s): CKTOTAL, CKMB, CKMBINDEX, TROPONINI in the last 168 hours. BNP (last 3 results) No results for input(s): PROBNP in the last 8760 hours. CBG: Recent Labs  Lab 11/10/20 1209 11/10/20 1650 11/10/20 2204 11/11/20 0732 11/11/20 1230  GLUCAP 110* 297* 229* 75 224*    Recent Results (from the past 240 hour(s))  Resp Panel by RT-PCR (Flu A&B, Covid) Nasopharyngeal Swab     Status: None   Collection Time: 11/06/20  7:35 PM   Specimen: Nasopharyngeal Swab; Nasopharyngeal(NP) swabs in vial transport medium  Result Value Ref Range Status   SARS Coronavirus 2 by RT PCR NEGATIVE NEGATIVE Final    Comment: (NOTE) SARS-CoV-2 target nucleic acids are NOT DETECTED.  The SARS-CoV-2 RNA is generally detectable in upper respiratory specimens during the acute phase of infection. The lowest concentration of SARS-CoV-2 viral copies this assay can detect is 138 copies/mL. A negative result does not preclude SARS-Cov-2 infection and should not be used as the sole basis for treatment or other patient management decisions. A  negative result may occur with  improper specimen collection/handling, submission of specimen other than nasopharyngeal swab, presence of viral mutation(s) within the areas targeted by this assay, and inadequate number of viral copies(<138 copies/mL). A negative result must be combined with clinical observations, patient history, and epidemiological information. The expected result is Negative.  Fact Sheet for Patients:  EntrepreneurPulse.com.au  Fact Sheet for Healthcare Providers:  IncredibleEmployment.be  This test is no t yet approved or cleared by the [place] FDA and  has been authorized for detection and/or diagnosis of SARS-CoV-2 by FDA under an Emergency Use Authorization (EUA). This EUA will remain  in effect (meaning this test can be used) for the duration of the COVID-19 declaration under Section 564(b)(1) of the Act, 21 U.S.C.section 360bbb-3(b)(1), unless the authorization is terminated  or revoked sooner.  Influenza A by PCR NEGATIVE NEGATIVE Final   Influenza B by PCR NEGATIVE NEGATIVE Final    Comment: (NOTE) The Xpert Xpress SARS-CoV-2/FLU/RSV plus assay is intended as an aid in the diagnosis of influenza from Nasopharyngeal swab specimens and should not be used as a sole basis for treatment. Nasal washings and aspirates are unacceptable for Xpert Xpress SARS-CoV-2/FLU/RSV testing.  Fact Sheet for Patients: EntrepreneurPulse.com.au  Fact Sheet for Healthcare Providers: IncredibleEmployment.be  This test is not yet approved or cleared by the [place] FDA and has been authorized for detection and/or diagnosis of SARS-CoV-2 by FDA under an Emergency Use Authorization (EUA). This EUA will remain in effect (meaning this test can be used) for the duration of the COVID-19 declaration under Section 564(b)(1) of the Act, 21 U.S.C. section 360bbb-3(b)(1), unless the authorization  is terminated or revoked.  Performed at [facility], [place] [address], [place], [place] [address]   Culture, blood (routine x 2)     Status: None   Collection Time: 11/06/20  9:40 PM   Specimen: BLOOD  Result Value Ref Range Status   Specimen Description BLOOD SITE NOT SPECIFIED  Final   Special Requests   Final    BOTTLES DRAWN AEROBIC AND ANAEROBIC Blood Culture adequate volume   Culture   Final    NO GROWTH 5 DAYS Performed at [facility], [address], [place], [place] [address]    Report Status 11/11/2020 FINAL  Final  Culture, blood (routine x 2)     Status: None (Preliminary result)   Collection Time: 11/06/20 10:20 PM   Specimen: BLOOD LEFT HAND  Result Value Ref Range Status   Specimen Description BLOOD LEFT HAND [facility] VANC  Final   Special Requests   Final    BOTTLES DRAWN AEROBIC AND ANAEROBIC Blood Culture adequate volume   Culture   Final    NO GROWTH 4 DAYS Performed at [facility], [place] [address] [place], [place] [address]    Report Status PENDING  Incomplete  MRSA PCR Screening     Status: Abnormal   Collection Time: 11/07/20  5:35 AM   Specimen: Nasopharyngeal  Result Value Ref Range Status   MRSA by PCR POSITIVE (A) NEGATIVE Final    Comment:        The GeneXpert MRSA Assay (FDA approved for NASAL specimens only), is one component of a comprehensive MRSA colonization surveillance program. It is not intended to diagnose MRSA infection nor to guide or monitor treatment for MRSA infections. RESULT CALLED TO, READ BACK BY AND VERIFIED WITH: Herschel Senegal RN 8:35 11/07/20 ([email or web address]) Performed at [facility], [place] [address], [place], [place] [address]   Expectorated sputum assessment w rflx to resp cult     Status: None (Preliminary result)   Collection Time: 11/10/20  8:42 AM   Specimen: Expectorated Sputum  Result Value Ref Range Status   Specimen Description EXPSU  Final   Special Requests NONE  Final   Sputum evaluation    Final    THIS SPECIMEN IS ACCEPTABLE FOR SPUTUM CULTURE Performed at [facility], [place] [address], [place], [place] [address]    Report Status PENDING  Incomplete  Culture, respiratory     Status: None (Preliminary result)   Collection Time: 11/10/20  8:42 AM  Result Value Ref Range Status   Specimen Description EXPSU  Final   Special Requests NONE Reflexed from [id number]  Final   Gram Stain  Final    RARE WBC PRESENT, PREDOMINANTLY PMN NO ORGANISMS SEEN Performed at [place] [address], [place], [place] [address]    Culture PENDING  Incomplete   Report Status PENDING  Incomplete      Studies: No results found.   Scheduled Meds: . aspirin EC  81 mg Oral Daily  . Chlorhexidine Gluconate Cloth  6 each Topical Q0600  . diltiazem  120 mg Oral Daily  . enoxaparin (LOVENOX) injection  30 mg Subcutaneous Q24H  . feeding supplement  237 mL Oral TID BM  . umeclidinium bromide  1 puff Inhalation Daily   And  . fluticasone furoate-vilanterol  1 puff Inhalation Daily  . insulin aspart  0-9 Units Subcutaneous TID WC  . lidocaine  2 patch Transdermal Q24H  . multivitamin with minerals  1 tablet Oral Daily  . mupirocin ointment  1 application Nasal BID  . pantoprazole  40 mg Oral Daily  . polyethylene glycol  17 g Oral Daily   Continuous Infusions: . ceFEPime (MAXIPIME) IV 2 g (11/11/20 1230)  . vancomycin Stopped (11/11/20 4462)    Active Problems:   COPD (chronic obstructive pulmonary disease) ([place])   Hyperglycemia   Sepsis ([place])   CAP (community acquired pneumonia)   Acute respiratory failure with hypoxia ([place])     K[place]ia Kasparian Derek Jack, [facility]  If 7PM-7AM, please contact night-coverage [email or web address] Password [email or web address] 11/11/2020, 1:27 PM    LOS: 5 days

## 2020-11-11 NOTE — Evaluation (Addendum)
Occupational Therapy Evaluation Patient Details Name: Megan [name] MRN: [id number] DOB: [birth year] Today's Date: 11/11/2020    History of Present Illness Megan [name] is a 70 year old female with a past medical history significant for Covid pneumonia August 2021, prior MI, GERD, hypertension, COPD on chronic supplemental oxygen at baseline, congestive heart failure, and anxiety who presented to the emergency department with shortness of breath. Pt with esophageal dymotility and possible R Lower lobe necrotizing pneumonia.   Clinical Impression   PTA, pt lives with daughter who works during the day. Unsure of previous functioning with ADLs and mobility as pt poor historian on eval. Pt presents now with deficits in strength, cardiopulmonary tolerance, cognition, and standing balance. Pt initially resistant to OOB activities but agreeable to toileting task. Pt overall Min A for bed mobility, Min A for stand pivot to [facility] with notable balance deficits. At this time, pt requires Min A for UB ADLs and Mod A for LB ADLs due to deficits. Pt on 3 L O2 with SpO2 and HR stable throughout though pt notably fatigued with minimal activity. Recommend SNF for short term rehab to decrease fall risk and maximize independence prior to returning home.     Follow Up Recommendations  SNF;Supervision/Assistance - 24 hour    Equipment Recommendations  3 in 1 bedside commode;Other (comment) (RW)    Recommendations for Other Services       Precautions / Restrictions Precautions Precautions: Fall Precaution Comments: confusion, watch SPo2 and HR Restrictions Weight Bearing Restrictions: No      Mobility Bed Mobility Overal bed mobility: Needs Assistance Bed Mobility: Supine to Sit;Sit to Supine     Supine to sit: Min assist Sit to supine: Supervision   General bed mobility comments: Min A with handheld assist to advance trunk, able to return to supine without assist    Transfers Overall transfer level:  Needs assistance Equipment used: 1 person hand held assist Transfers: Sit to/from [facility] Sit to Stand: Min assist Stand pivot transfers: Min assist       General transfer comment: Min A for power up and maintaining balance with transfer to [facility]. Once at [facility], pt distracted by tangled cords and declined further assist with toileting task    Balance Overall balance assessment: Needs assistance Sitting-balance support: Feet supported;Bilateral upper extremity supported Sitting balance-Leahy Scale: Poor Sitting balance - Comments: requires at least one UE support EOB to maintain balance   Standing balance support: Single extremity supported;During functional activity Standing balance-Leahy Scale: Poor Standing balance comment: reliant on external support                           ADL either performed or assessed with clinical judgement   ADL Overall ADL's : Needs assistance/impaired Eating/Feeding: Set up;Sitting   Grooming: Supervision/safety;Sitting   Upper Body Bathing: Sitting;Minimal assistance   Lower Body Bathing: Moderate assistance;Sit to/from stand   Upper Body Dressing : Set up;Sitting   Lower Body Dressing: Moderate assistance;Sit to/from stand Lower Body Dressing Details (indicate cue type and reason): Assistance to don socks (suspect due to motivation rather than physical impairments). Toilet Transfer: Minimal Production assistant, radio Details (indicate cue type and reason): Min A to maintain balance for pivot to [facility] with manual assist, cues needed for sequencing throughout Toileting- Clothing Manipulation and Hygiene: Moderate assistance;Sit to/from stand         General ADL Comments: Limited by decreased endurance, strength and awareness of current deficits  Vision Patient Visual Report: No change from baseline Vision Assessment?: No apparent visual deficits     Perception     Praxis      Pertinent  Vitals/Pain Pain Assessment: No/denies pain     Hand Dominance Right   Extremity/Trunk Assessment Upper Extremity Assessment Upper Extremity Assessment: Generalized weakness   Lower Extremity Assessment Lower Extremity Assessment: Defer to PT evaluation   Cervical / Trunk Assessment Cervical / Trunk Assessment: Kyphotic   Communication Communication Communication: No difficulties   Cognition Arousal/Alertness: Awake/alert Behavior During Therapy: Flat affect Overall Cognitive Status: Impaired/Different from baseline Area of Impairment: Memory;Following commands;Safety/judgement;Awareness;Problem solving                     Memory: Decreased short-term memory Following Commands: Follows one step commands with increased time Safety/Judgement: Decreased awareness of safety;Decreased awareness of deficits Awareness: Emergent Problem Solving: Slow processing;Difficulty sequencing;Requires verbal cues General Comments: Pt A&Ox4 but some decreased awareness of deficits and impact of remaining in bed. Pt requires cues for multi step sequencing and attending to task   General Comments  Pt on 3 L O2 with SpO2 WFL with minimal activity, HR WFL. Pt a bit self limiting by distractibility and perseveration on not being able to sleep    Exercises     Shoulder Instructions      Home Living Family/patient expects to be discharged to:: Private residence Living Arrangements: Children Available Help at Discharge: Family;Available PRN/intermittently Type of Home: House Home Access: Stairs to enter [employer] of Steps: 2 Entrance Stairs-Rails: None Home Layout: One level     Bathroom Shower/Tub: Teacher, early years/pre: Standard Bathroom Accessibility: Yes   Home Equipment: Cane - single point;Walker - 4 wheels;Other (comment)   Additional Comments: home O2, 3LO2 via [place]      Prior Functioning/Environment Level of Independence: Needs assistance   Gait / Transfers Assistance Needed: unsure at pt poor historian and unable to reach family, suspect pt was using a RW ADL's / Homemaking Assistance Needed: usure but suspect pt was receiving some help with ADLs and IADLs            OT Problem List: Decreased strength;Decreased activity tolerance;Impaired balance (sitting and/or standing);Decreased safety awareness;Decreased cognition;Decreased knowledge of use of DME or AE;Cardiopulmonary status limiting activity      OT Treatment/Interventions: Self-care/ADL training;Therapeutic exercise;Energy conservation;DME and/or AE instruction;Therapeutic activities;Patient/family education;Balance training    OT Goals(Current goals can be found in the care plan section) Acute Rehab OT Goals Patient Stated Goal: get some sleep OT Goal Formulation: With patient Time For Goal Achievement: 11/25/20 Potential to Achieve Goals: Good ADL Goals Pt Will Perform Grooming: with modified independence;standing Pt Will Perform Lower Body Bathing: with supervision;sitting/lateral leans;sit to/from stand Pt Will Perform Lower Body Dressing: with supervision;sitting/lateral leans;sit to/from stand Pt Will Transfer to Toilet: with supervision;ambulating;regular height toilet Pt Will Perform Toileting - Clothing Manipulation and hygiene: with modified independence;sitting/lateral leans;sit to/from stand Pt/caregiver will Perform Home Exercise Program: Increased strength;Both right and left upper extremity;With theraband;Independently;With written HEP provided Additional ADL Goal #1: Pt to demonstrate implementation of at least 2 energy conservation strategies during ADLs  OT Frequency: Min 2X/week   Barriers to D/C:            Co-evaluation              AM-PAC OT "6 Clicks" Daily Activity     Outcome Measure Help from another person eating meals?: A Little Help from another person  taking care of personal grooming?: A Little Help from another person  toileting, which includes using toliet, bedpan, or urinal?: A Lot Help from another person bathing (including washing, rinsing, drying)?: A Lot Help from another person to put on and taking off regular upper body clothing?: A Little Help from another person to put on and taking off regular lower body clothing?: A Lot 6 Click Score: 15   End of Session Equipment Utilized During Treatment: Gait belt;Oxygen Nurse Communication: Mobility status  Activity Tolerance: Patient limited by fatigue Patient left: in bed;with call bell/phone within reach;with bed alarm set  OT Visit Diagnosis: Unsteadiness on feet (R26.81);Other abnormalities of gait and mobility (R26.89);Muscle weakness (generalized) (M62.81)                Time: 0034-9611 OT Time Calculation (min): 24 min Charges:  OT General Charges $OT Visit: 1 Visit OT Evaluation $OT Eval Moderate Complexity: 1 Mod OT Treatments $Self Care/Home Management : 8-22 mins  Layla Maw, OTR/L  Layla Maw 11/11/2020, 12:41 PM

## 2020-11-11 NOTE — Progress Notes (Signed)
NAME:  Megan [name], MRN:  [id number], DOB:  Nov 23, 1950, LOS: 5 ADMISSION DATE:  11/06/2020, CONSULTATION DATE:  11/08/2019 REFERRING MD:  Dr Manuella Ghazi CHIEF COMPLAINT: Abnormal CT scan  Brief History:  70 year old female presents with acute respiratory distress with history of multiple pulmonary bacterial and viral infections.  Also has history of esophageal dysmotility.  PCCM consulted for further management of questionable right lower lobe necrotizing pneumonia.  No acute distress.  History of Present Illness:  Megan [name] is a 70 year old female with a past medical history significant for Covid pneumonia August 2021, prior MI, GERD, hypertension, COPD on chronic supplemental oxygen at baseline, congestive heart failure, and anxiety who presented to the emergency department with shortness of breath.  Patient states shortness of breath had improved after having Covid pneumonia August 2021.  She states that her supplemental oxygen post discharge was 2.5 liters continuously.  Of note patient was seen with questionable cavitary lesions in the left upper lobe but during that hospitalization.  Patient reports 2 days prior to this admission shortness of breath had worsened with productive cough as well.  She also reports subjective fever with chills and night sweats that began 2 days prior to admission.  On EMS arrival patient was seen hypoxic requiring placement of CPAP.  On ED arrival patient was transitioned to BiPAP to continue with increased work of breathing.  Vital signs significant on admission for temperature 100.1, tachycardia with a rate of 120-140, tachycardia and mild hypotension.  24.4, chloride 88, glucose 147, creatinine 1.21, high-sensitivity troponin 36, BNP 341.7, WBC 29.8, hemoglobin 10.5 and lactic acidosis 2.0.  Patient was admitted under hospitalist service for sepsis secondary to pneumonia and PCCM was consulted for further management  Past Medical History:  Admitted 11/07/2018.Covid  pneumonia August 2021, prior MI, GERD, hypertension, COPD on chronic supplemental oxygen at baseline, congestive heart failure, and anxiety   Significant Hospital Events:  Admitted 11/07/2018.  Consults:  Pulmonary  Procedures:  none  Significant Diagnostic Tests:  CT chest without contrast 11/07/2020 > Dense, near complete consolidation of the right lower lobe with cavitation in keeping with lobar, necrotizing pneumonia. Complete impaction of the right lower lobar airways. Moderate centrilobular emphysema with marked pulmonary hyperinflation. Multiple pulmonary nodules, likely inflammatory given their relatively rapid development since prior examination. Micro Data:  COVID 11/06/2020 > negative Blood cultures 11/06/2020 >  Culture 11/06/2020 >  MRSA PCR 11/07/2020 > positive Antimicrobials:   Levaquin 11/06/2020 Vancomycin 11/06/2020 Flagyl 11/06/2020 Interim History / Subjective:  Laying in bed, denies any significant complaints  Objective   Blood pressure (!) 127/51, pulse 97, temperature 98.5 F (36.9 C), temperature source Oral, resp. rate (!) 23, height 5\' 3"  (1.6 m), weight 43.1 kg, SpO2 98 %.        Intake/Output Summary (Last 24 hours) at 11/11/2020 1237 Last data filed at 11/11/2020 1000 Gross per 24 hour  Intake 880 ml  Output -  Net 880 ml   Filed Weights   11/06/20 1929  Weight: 43.1 kg    Examination: General: Frail, HENT: Moist oral mucosa Lungs: Decreased air movement bilaterally Cardiovascular: S1-S2 appreciated Abdomen: Bowel sounds appreciated Extremities: No clubbing, no edema Neuro: Sleepy GU:   Resolved Hospital Problem list     Assessment & Plan:  Septic secondary to necrotizing pneumonia -CT showing dense consolidation right lower lobe -Continue antibiotics  Chronic hypoxic and hypercapnic respiratory failure -Continue oxygen supplementation -Maintain saturations greater than 99%  Severe/end-stage COPD -Bronchodilators   History of esophageal  dysmotility -  Speech evaluation and management   Complete 7 days of treatment with cefepime combined with vancomycin-MRSA PCR positive Discontinue Flagyl Leukocytosis better  Sherrilyn Rist, MD [place] P[facility]M Pager: [phone]

## 2020-11-12 DIAGNOSIS — J42 Unspecified chronic bronchitis: Secondary | ICD-10-CM | POA: Diagnosis not present

## 2020-11-12 DIAGNOSIS — J189 Pneumonia, unspecified organism: Secondary | ICD-10-CM | POA: Diagnosis not present

## 2020-11-12 DIAGNOSIS — J9622 Acute and chronic respiratory failure with hypercapnia: Secondary | ICD-10-CM | POA: Diagnosis not present

## 2020-11-12 DIAGNOSIS — R1319 Other dysphagia: Secondary | ICD-10-CM | POA: Diagnosis not present

## 2020-11-12 DIAGNOSIS — J449 Chronic obstructive pulmonary disease, unspecified: Secondary | ICD-10-CM

## 2020-11-12 DIAGNOSIS — J9601 Acute respiratory failure with hypoxia: Secondary | ICD-10-CM | POA: Diagnosis not present

## 2020-11-12 DIAGNOSIS — J9621 Acute and chronic respiratory failure with hypoxia: Secondary | ICD-10-CM | POA: Diagnosis not present

## 2020-11-12 LAB — CULTURE, BLOOD (ROUTINE X 2)
Culture: NO GROWTH
Special Requests: ADEQUATE

## 2020-11-12 LAB — CBC WITH DIFFERENTIAL/PLATELET
Abs Immature Granulocytes: 0.18 10*3/uL — ABNORMAL HIGH (ref 0.00–0.07)
Basophils Absolute: 0.1 10*3/uL (ref 0.0–0.1)
Basophils Relative: 1 %
Eosinophils Absolute: 0.1 10*3/uL (ref 0.0–0.5)
Eosinophils Relative: 0 %
HCT: 33.3 % — ABNORMAL LOW (ref 36.0–46.0)
Hemoglobin: 9.8 g/dL — ABNORMAL LOW (ref 12.0–15.0)
Immature Granulocytes: 1 %
Lymphocytes Relative: 3 %
Lymphs Abs: 0.4 10*3/uL — ABNORMAL LOW (ref 0.7–4.0)
MCH: 30.3 pg (ref 26.0–34.0)
MCHC: 29.4 g/dL — ABNORMAL LOW (ref 30.0–36.0)
MCV: 103.1 fL — ABNORMAL HIGH (ref 80.0–100.0)
Monocytes Absolute: 0.9 10*3/uL (ref 0.1–1.0)
Monocytes Relative: 6 %
Neutro Abs: 12.6 10*3/uL — ABNORMAL HIGH (ref 1.7–7.7)
Neutrophils Relative %: 89 %
Platelets: 322 10*3/uL (ref 150–400)
RBC: 3.23 MIL/uL — ABNORMAL LOW (ref 3.87–5.11)
RDW: 14 % (ref 11.5–15.5)
WBC: 14.2 10*3/uL — ABNORMAL HIGH (ref 4.0–10.5)
nRBC: 0.1 % (ref 0.0–0.2)

## 2020-11-12 LAB — EXPECTORATED SPUTUM ASSESSMENT W GRAM STAIN, RFLX TO RESP C

## 2020-11-12 LAB — GLUCOSE, CAPILLARY
Glucose-Capillary: 141 mg/dL — ABNORMAL HIGH (ref 70–99)
Glucose-Capillary: 109 mg/dL — ABNORMAL HIGH (ref 70–99)
Glucose-Capillary: 101 mg/dL — ABNORMAL HIGH (ref 70–99)
Glucose-Capillary: 177 mg/dL — ABNORMAL HIGH (ref 70–99)

## 2020-11-12 LAB — BASIC METABOLIC PANEL
Anion gap: 5 (ref 5–15)
BUN: 17 mg/dL (ref 8–23)
CO2: 48 mmol/L — ABNORMAL HIGH (ref 22–32)
Calcium: 8.7 mg/dL — ABNORMAL LOW (ref 8.9–10.3)
Chloride: 87 mmol/L — ABNORMAL LOW (ref 98–111)
Creatinine, Ser: 0.44 mg/dL (ref 0.44–1.00)
GFR, Estimated: >60 mL/min (ref >60)
Glucose, Bld: 202 mg/dL — ABNORMAL HIGH (ref 70–99)
Potassium: 4.8 mmol/L (ref 3.5–5.1)
Sodium: 140 mmol/L (ref 135–145)

## 2020-11-12 LAB — MAGNESIUM: Magnesium: 1.8 mg/dL (ref 1.7–2.4)

## 2020-11-12 MED ORDER — METHYLPREDNISOLONE SODIUM SUCC 40 MG IJ SOLR
40.0000 mg | Freq: Three times a day (TID) | INTRAMUSCULAR | Status: DC
  Administered 2020-11-12 – 2020-11-14 (×6): 40 mg via INTRAVENOUS
  Filled 2020-11-12 (×6): qty 1

## 2020-11-12 MED ORDER — MORPHINE SULFATE (PF) 2 MG/ML IV SOLN
2.0000 mg | INTRAVENOUS | Status: DC | PRN
  Administered 2020-11-13: 2 mg via INTRAVENOUS
  Filled 2020-11-12: qty 1

## 2020-11-12 NOTE — Progress Notes (Signed)
Nutrition Follow Up  DOCUMENTATION CODES:   Underweight,Severe malnutrition in context of chronic illness  INTERVENTION:   Liberalize diet to REGULAR   Ensure Enlive po TID, each supplement provides 350 kcal and 20 grams of protein  Magic cup TID with meals, each supplement provides 290 kcal and 9 grams of protein  MVI daily   NUTRITION DIAGNOSIS:   Severe Malnutrition related to chronic illness (COPD, CHF) as evidenced by severe fat depletion,severe muscle depletion.  Ongoing  GOAL:   Patient will meet greater than or equal to 90% of their needs  Progressing   MONITOR:   PO intake,Supplement acceptance,Weight trends,Labs,I & O's  REASON FOR ASSESSMENT:   Consult Assessment of nutrition requirement/status  ASSESSMENT:   70 year old female admitted for sepsis secondary to pneumonia presented with worsening shortness of breath. Past medical history significant of COPD, HTN, CHF, depression, GERD and previous hospitalization for pneumonia secondary to COVID-19 infection in August 2021.   Pt reports appetite remains off/on. Last two meal completions charted as 75% and 100%. Taking 2-3 Ensures daily. NFPE shows severe depletion in both fat and muscle. Encourage meal and supplement intake.   Admission weight: 43.1 kg  No current weight has been obtained   UOP: 400 ml x 24 hrs   Medications: SS novolog, miralax Labs: CBG 75-230  NUTRITION - FOCUSED PHYSICAL EXAM:  Flowsheet Row Most Recent Value  Orbital Region Severe depletion  Upper Arm Region Severe depletion  Thoracic and Lumbar Region Severe depletion  Buccal Region Severe depletion  Temple Region Severe depletion  Clavicle Bone Region Severe depletion  Clavicle and Acromion Bone Region Severe depletion  Scapular Bone Region Severe depletion  Dorsal Hand Severe depletion  Patellar Region Severe depletion  Anterior Thigh Region Severe depletion  Posterior Calf Region Severe depletion  Edema (RD  Assessment) None  Hair Reviewed  Eyes Reviewed  Mouth Reviewed  Skin Reviewed  Nails Reviewed     Diet Order:   Diet Order            Diet Heart Room service appropriate? Yes; Fluid consistency: Thin  Diet effective now                 EDUCATION NEEDS:   Education needs have been addressed  Skin:  Skin Assessment: Reviewed RN Assessment  Last BM:  1/8  Height:   Ht Readings from Last 1 Encounters:  11/06/20 5\' 3"  (1.6 m)    Weight:   Wt Readings from Last 1 Encounters:  11/06/20 43.1 kg    BMI:  Body mass index is 16.83 kg/m.  Estimated Nutritional Needs:   Kcal:  1400-1600  Protein:  65-73  Fluid:  >1.2 L  Mariana Single RD, LDN Clinical Nutrition Pager listed in [place]

## 2020-11-12 NOTE — Progress Notes (Signed)
Pt daughter requested to speak to physician one more time, prior making a decision to discontinue bipap.  MD on call paged.

## 2020-11-12 NOTE — Progress Notes (Signed)
[place] Progress Note Patient Name: Megan [name] DOB: Aug 06, 1951 MRN: [id number]   Date of Service  11/12/2020  HPI/Events of Note  Spoke with daughter, Randel Pigg, who is having a rough time coming to terms with her mother's illness. I told her that I agreed with the assessment of Drl Olarlare that it would not be in her mother's best interest to be intubated and ventilated given her end-stage COPD, aspiration and general failing health. Given her the option: 1. Continue management as we are currently doing or 2. Comfort measures - remove BiPAP and Morphine IV infusion to allow patient to pass with comfort and dignity. She is struggling with the decision and asks for time to think about it.   eICU Interventions  Continue current management.      Intervention Category Major Interventions: End of life / care limitation discussion  Lysle Dingwall 11/12/2020, 9:53 PM

## 2020-11-12 NOTE — Progress Notes (Addendum)
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  Work-up revealed right-sided consolidation with pleural effusion and CT showed cavitary pneumonia .  Patient was transitioned to a BiPAP and started on broad-spectrum antibiotics.  She was liberated from BiPAP on 1/7 but continued to require high level of oxygen.  The patient was admitted in August of last year with COVID pneumonia and she was discharged home with 2 L of oxygen.   Subjective: She admits to having a cough.  She is very weak.  She has no other complaints.    Assessment & Plan:   Principal Problem: Cavitary pneumonia/  Sepsis-suspected chronic aspiration Acute on chronic hypoxemic and hypercapnic respiratory failure Dysphagia secondary to esophageal dysmotility - When the patient was last admitted in August, she had a 2.8 x 2.8 cm cavitation of the left upper lobe. - It was suspected at that time that she may suffer from chronic aspiration -She was also noted to be MRSA PCR positive on this admission  Plan:  -Currently requiring 2.5 L of oxygen at rest-we will wean as able -Per pulmonary, the patient will need at least 21 days total of antibiotics -Antibiotics since admission: Metronidazole, vancomycin, levofloxacin (1/7-1/10) -WBC count today is 14.2 - down from admission - Levaquin and Flagyl transition to cefepime on 1/11 and vancomycin continued -Continue Incruse Ellipta and Breo Ellipta -I feel that goals of care conversations need to be initiated- will consult palliative care  Addendum: increasing respiratory distress. Starting BiPAP. Asked RN to NT suction. Will give IV Solumedrol. Have alerted Dr Ander Slade. I called the  daughter and had to leave a message. Number listed for son is the wrong number.   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 - Possibly due to chronic steroids - Last hemoglobin A1c was 6.7 - Continue insulin  Pulmonary hypertension - Noted on 2D echo on 11/08/2020-likely secondary to underlying emphysema  Hypertension/sinus tachycardia - Continue Cardizem -DC HCTZ   Time spent in minutes: 40 DVT prophylaxis: enoxaparin (LOVENOX) injection 30 mg Start: 11/09/20 1000  Code Status: Full code Family Communication:  Disposition Plan:  Status is: Inpatient  Remains inpatient appropriate because:Ongoing respiratory failure, pneumonia being treated with IV antibiotics   Dispo: The patient is from: Home              Anticipated d/c is to: SNF              Anticipated d/c date is: > 3 days              Patient currently is not medically stable to d/c.      Consultants:   Pulmonary critical care Procedures:   BiPAP Antimicrobials:  Anti-infectives (From admission, onward)   Start     Dose/Rate Route Frequency Ordered Stop   11/11/20 1100  ceFEPIme (MAXIPIME) 2 g in sodium chloride 0.9 % 100 mL IVPB  2 g 200 mL/hr over 30 Minutes Intravenous Every 12 hours 11/11/20 1001     11/09/20 2130  vancomycin (VANCOCIN) IVPB 1000 mg/200 mL premix        1,000 mg 200 mL/hr over 60 Minutes Intravenous Every 36 hours 11/09/20 0905     11/08/20 2000  levofloxacin (LEVAQUIN) IVPB 750 mg        750 mg 100 mL/hr over 90 Minutes Intravenous Every 48 hours 11/07/20 0149 11/10/20 2305   11/08/20 0930  vancomycin (VANCOREADY) IVPB 750 mg/150  mL  Status:  Discontinued        750 mg 150 mL/hr over 60 Minutes Intravenous Every 36 hours 11/06/20 2130 11/09/20 0905   11/07/20 0800  metroNIDAZOLE (FLAGYL) IVPB 500 mg  Status:  Discontinued        500 mg 100 mL/hr over 60 Minutes Intravenous Every 8 hours 11/07/20 0744 11/11/20 1247   11/07/20 0145  levofloxacin (LEVAQUIN) IVPB 750 mg  Status:  Discontinued        750 mg 100 mL/hr over 90 Minutes Intravenous Every 24 hours 11/07/20 0136 11/07/20 0148   11/06/20 2045  vancomycin (VANCOCIN) IVPB 1000 mg/200 mL premix        1,000 mg 200 mL/hr over 60 Minutes Intravenous  Once 11/06/20 2033 11/06/20 2230   11/06/20 2030  levofloxacin (LEVAQUIN) IVPB 750 mg        750 mg 100 mL/hr over 90 Minutes Intravenous  Once 11/06/20 2016 11/06/20 2230       Objective: Vitals:   11/11/20 1938 11/11/20 2333 11/12/20 0247 11/12/20 0743  BP: (!) 124/49 (!) 129/56 (!) 116/56 (!) 118/46  Pulse: 97 (!) 102 99 (!) 108  Resp: 18  20 20   Temp: 98.1 F (36.7 C) 98.2 F (36.8 C) 98.3 F (36.8 C) 97.6 F (36.4 C)  TempSrc: Oral Oral Oral Oral  SpO2: 100% 99% 98% 91%  Weight:      Height:        Intake/Output Summary (Last 24 hours) at 11/12/2020 1040 Last data filed at 11/12/2020 0303 Gross per 24 hour  Intake 391.05 ml  Output 400 ml  Net -8.95 ml   Filed Weights   11/06/20 1929  Weight: 43.1 kg    Examination: General exam:  -frail-appearing female lying in bed who appears comfortable HEENT: PERRLA, oral mucosa dry, no sclera icterus or thrush Respiratory system: Positive for rhonchi -. Respiratory effort normal. Cardiovascular system: S1 & S2 heard, RRR.   Gastrointestinal system: Abdomen soft, non-tender, nondistended. Normal bowel sounds. Central nervous system: Alert and oriented. No focal neurological deficits. Extremities: No cyanosis, clubbing or edema Skin: No rashes or ulcers Psychiatry:  Mood & affect appropriate.     Data Reviewed: I have personally reviewed  following labs and imaging studies  CBC: Recent Labs  Lab 11/06/20 2200 11/07/20 0423 11/08/20 0516 11/09/20 0107 11/10/20 0844 11/11/20 0429 11/12/20 0227  WBC 29.8* 31.9* 31.5* 21.3* 17.6* 13.4* 14.2*  NEUTROABS 26.0* 30.0*  --   --   --   --  12.6*  HGB 10.5* 10.0* 9.7* 9.2* 10.2* 8.7* 9.8*  HCT 36.2 33.0* 33.1* 30.3* 34.3* 32.0* 33.3*  MCV 104.6* 99.4 99.1 97.1 100.9* 104.9* 103.1*  PLT 432* 488* 523* 498* 480* 399 856   Basic Metabolic Panel: Recent Labs  Lab 11/08/20 0516 11/09/20 0107 11/10/20 0350 11/10/20 0844 11/11/20 0429 11/12/20 0440  NA 137 137  --  139 140 140  K 4.5 4.4  --  5.4*  5.1 4.8  CL 89* 88*  --  89* 90* 87*  CO2 40* 39*  --  41* 42* 48*  GLUCOSE 180* 198*  --  145* 142* 202*  BUN 32* 40*  --  21 23 17   CREATININE 0.80 0.74  --  0.60 0.55 0.44  CALCIUM 9.1 9.3  --  9.0 8.5* 8.7*  MG 1.9 1.5* 2.0  --   --  1.8   GFR: Estimated Creatinine Clearance: 46.4 mL/min (by C-G formula based on SCr of 0.44 mg/dL). Liver Function Tests: Recent Labs  Lab 11/06/20 1932 11/07/20 0423  AST 12* 11*  ALT 9 8  ALKPHOS 128* 95  BILITOT 0.9 0.5  PROT 6.6 5.4*  ALBUMIN 2.7* 2.1*   No results for input(s): LIPASE, AMYLASE in the last 168 hours. No results for input(s): AMMONIA in the last 168 hours. Coagulation Profile: Recent Labs  Lab 11/06/20 2132  INR 1.4*   Cardiac Enzymes: No results for input(s): CKTOTAL, CKMB, CKMBINDEX, TROPONINI in the last 168 hours. BNP (last 3 results) No results for input(s): PROBNP in the last 8760 hours. HbA1C: No results for input(s): HGBA1C in the last 72 hours. CBG: Recent Labs  Lab 11/11/20 0732 11/11/20 1230 11/11/20 1619 11/11/20 2221 11/12/20 0743  GLUCAP 75 224* 230* 156* 141*   Lipid Profile: No results for input(s): CHOL, HDL, LDLCALC, TRIG, CHOLHDL, LDLDIRECT in the last 72 hours. Thyroid Function Tests: No results for input(s): TSH, T4TOTAL, FREET4, T3FREE, THYROIDAB in the last 72  hours. Anemia Panel: Recent Labs    11/11/20 0429  VITAMINB12 244   Urine analysis:    Component Value Date/Time   COLORURINE YELLOW 08/07/2020 1217   APPEARANCEUR CLEAR 08/07/2020 1217   LABSPEC 1.015 08/07/2020 1217   PHURINE 7.5 08/07/2020 1217   GLUCOSEU NEGATIVE 08/07/2020 1217   HGBUR NEGATIVE 08/07/2020 1217   BILIRUBINUR NEGATIVE 08/07/2020 1217   KETONESUR NEGATIVE 08/07/2020 1217   PROTEINUR 100 (A) 11/04/2018 1622   UROBILINOGEN 0.2 08/07/2020 1217   NITRITE NEGATIVE 08/07/2020 1217   LEUKOCYTESUR NEGATIVE 08/07/2020 1217   Sepsis Labs: @LABRCNTIP (procalcitonin:4,lacticidven:4) ) Recent Results (from the past 240 hour(s))  Resp Panel by RT-PCR (Flu A&B, Covid) Nasopharyngeal Swab     Status: None   Collection Time: 11/06/20  7:35 PM   Specimen: Nasopharyngeal Swab; Nasopharyngeal(NP) swabs in vial transport medium  Result Value Ref Range Status   SARS Coronavirus 2 by RT PCR NEGATIVE NEGATIVE Final    Comment: (NOTE) SARS-CoV-2 target nucleic acids are NOT DETECTED.  The SARS-CoV-2 RNA is generally detectable in upper respiratory specimens during the acute phase of infection. The lowest concentration of SARS-CoV-2 viral copies this assay can detect is 138 copies/mL. A negative result does not preclude SARS-Cov-2 infection and should not be used as the sole basis for treatment or other patient management decisions. A negative result may occur with  improper specimen collection/handling, submission of specimen other than nasopharyngeal swab, presence of viral mutation(s) within the areas targeted by this assay, and inadequate number of viral copies(<138 copies/mL). A negative result must be combined with clinical observations, patient history, and epidemiological information. The expected result is Negative.  Fact Sheet for Patients:  EntrepreneurPulse.com.au  Fact Sheet for Healthcare Providers:   IncredibleEmployment.be  This test is no t yet approved or cleared by the [place] FDA and  has been authorized for detection and/or diagnosis of SARS-CoV-2 by FDA under an Emergency Use Authorization (EUA). This EUA will remain  in effect (meaning  this test can be used) for the duration of the COVID-19 declaration under Section 564(b)(1) of the Act, 21 U.S.C.section 360bbb-3(b)(1), unless the authorization is terminated  or revoked sooner.       Influenza A by PCR NEGATIVE NEGATIVE Final   Influenza B by PCR NEGATIVE NEGATIVE Final    Comment: (NOTE) The Xpert Xpress SARS-CoV-2/FLU/RSV plus assay is intended as an aid in the diagnosis of influenza from Nasopharyngeal swab specimens and should not be used as a sole basis for treatment. Nasal washings and aspirates are unacceptable for Xpert Xpress SARS-CoV-2/FLU/RSV testing.  Fact Sheet for Patients: EntrepreneurPulse.com.au  Fact Sheet for Healthcare Providers: IncredibleEmployment.be  This test is not yet approved or cleared by the [place] FDA and has been authorized for detection and/or diagnosis of SARS-CoV-2 by FDA under an Emergency Use Authorization (EUA). This EUA will remain in effect (meaning this test can be used) for the duration of the COVID-19 declaration under Section 564(b)(1) of the Act, 21 U.S.C. section 360bbb-3(b)(1), unless the authorization is terminated or revoked.  Performed at [facility], [place] [address], [place], [place] [address]   Culture, blood (routine x 2)     Status: None   Collection Time: 11/06/20  9:40 PM   Specimen: BLOOD  Result Value Ref Range Status   Specimen Description BLOOD SITE NOT SPECIFIED  Final   Special Requests   Final    BOTTLES DRAWN AEROBIC AND ANAEROBIC Blood Culture adequate volume   Culture   Final    NO GROWTH 5 DAYS Performed at [facility], [address] [place], [place]  [address]    Report Status 11/11/2020 FINAL  Final  Culture, blood (routine x 2)     Status: None   Collection Time: 11/06/20 10:20 PM   Specimen: BLOOD LEFT HAND  Result Value Ref Range Status   Specimen Description BLOOD LEFT HAND [facility] VANC  Final   Special Requests   Final    BOTTLES DRAWN AEROBIC AND ANAEROBIC Blood Culture adequate volume   Culture   Final    NO GROWTH 5 DAYS Performed at [facility], [place] [address], [place], [place] [address]    Report Status 11/12/2020 FINAL  Final  MRSA PCR Screening     Status: Abnormal   Collection Time: 11/07/20  5:35 AM   Specimen: Nasopharyngeal  Result Value Ref Range Status   MRSA by PCR POSITIVE (A) NEGATIVE Final    Comment:        The GeneXpert MRSA Assay (FDA approved for NASAL specimens only), is one component of a comprehensive MRSA colonization surveillance program. It is not intended to diagnose MRSA infection nor to guide or monitor treatment for MRSA infections. RESULT CALLED TO, READ BACK BY AND VERIFIED WITH: Herschel Senegal RN 8:35 11/07/20 ([email or web address]) Performed at [facility], [place] [address] [place],  [address]   Expectorated sputum assessment w rflx to resp cult     Status: None   Collection Time: 11/10/20  8:42 AM   Specimen: Expectorated Sputum  Result Value Ref Range Status   Specimen Description EXPSU  Final   Special Requests NONE  Final   Sputum evaluation   Final    THIS SPECIMEN IS ACCEPTABLE FOR SPUTUM CULTURE Performed at [facility], [place] [address], [place],  [address]    Report Status 11/12/2020 FINAL  Final  Culture, respiratory     Status: None (Preliminary result)  Collection Time: 11/10/20  8:42 AM  Result Value Ref Range Status   Specimen Description EXPSU  Final   Special Requests NONE Reflexed from [id number]  Final   Gram Stain   Final    RARE WBC PRESENT, PREDOMINANTLY PMN NO ORGANISMS SEEN    Culture   Final    CULTURE REINCUBATED FOR BETTER  GROWTH Performed at [facility], [place] [address], [place], [place] [address]    Report Status PENDING  Incomplete         Radiology Studies: No results found.    Scheduled Meds: . aspirin EC  81 mg Oral Daily  . Chlorhexidine Gluconate Cloth  6 each Topical Q0600  . diltiazem  120 mg Oral Daily  . enoxaparin (LOVENOX) injection  30 mg Subcutaneous Q24H  . feeding supplement  237 mL Oral TID BM  . umeclidinium bromide  1 puff Inhalation Daily   And  . fluticasone furoate-vilanterol  1 puff Inhalation Daily  . insulin aspart  0-9 Units Subcutaneous TID WC  . lidocaine  2 patch Transdermal Q24H  . multivitamin with minerals  1 tablet Oral Daily  . pantoprazole  40 mg Oral Daily  . polyethylene glycol  17 g Oral Daily   Continuous Infusions: . ceFEPime (MAXIPIME) IV 2 g (11/12/20 0959)  . vancomycin Stopped (11/11/20 0927)     LOS: 6 days      Debbe Odea, MD [facility] Pager: [email or web address] 11/12/2020, 10:40 AM

## 2020-11-12 NOTE — Progress Notes (Signed)
Patient with increased lethargy and dyspnea.  Respiratory called.  Nebs started. MD paged and contacted.  Bipap ordered.  Respiratory at bedside.  MD paged again to further assess.  Will continue to monitor

## 2020-11-12 NOTE — Progress Notes (Addendum)
  PCCM INTERVAL PROGRESS NOTE   Called to bedside to evaluate patient in acute respiratory distress.   Briefly this is a 70 year old female with end stage COPD and recurrent pneumonias likely related to chronic aspiration. She has been stable on supplemental O2, but this afternoon she has become more lethargic, hypoxia, and has developed progressive respiratory distress. Desaturated to the 44s. he was started on BiPAP with some improvement.   On exam she is lethargic. She will arouse to tactile stimuli, but will not answer questions. She is quite frail and appears older than her stated age. She is in quite a bit of respiratory distress. She is moving very little air despite rapid respiratory rate. What I can hear of breath sounds are very rhoncherous and pooled upper airway secretions that have not amenable to nasotracheal suctioning.   Attending MD has spoken with the patient prior to my arrival, and the patient has declined intubation. Presently she is declining further BiPAP, which quite honestly, she is not a good candidate for considering her history of aspiration.   At this point, considering her wishes regarding BiPAP and Intubation, the only remaining option is palliation of symptoms. Continue supportive care with supplemental oxygen as tolerated. Goals of care need to be readdressed. Family is on their way in.    Addendum: Myself and Dr. Ander Slade have spoken with the patient's daughter. She understands aggressive measures are not in line with her mother's wishes. Code status DNR. Will start morphine PRN for comfort. OK to remain on BiPAP while family visits, but will discontinue when family is ready.    Megan [name], AGACNP-BC [place]  See Amion for personal pager PCCM on call pager [phone]  11/12/2020 5:38 PM

## 2020-11-12 NOTE — Progress Notes (Signed)
Chaplain requested support for the patient at EOL.  Daughter was present.  Chaplain built rapport and offered space for her to share as they moved here from [place] about 4 years ago.  Family has experienced a lot of loss and this is hard for the daughter.  She requested rosary and chaplain provided.  Chaplain offered prayer, words of comfort and support.  Chaplain available as needed for further support. [place], Mdiv.    11/12/20 2000  Clinical Encounter Type  Visited With Patient and family together  Visit Type Patient actively dying  Referral From Family;Nurse  Consult/Referral To Chaplain  Spiritual Encounters  Spiritual Needs Grief support;Emotional;Prayer;Ritual

## 2020-11-12 NOTE — Significant Event (Signed)
Rapid Response Event Note   Reason for Call :  Tachycardia   Initial Focused Assessment:  Pt lying in bed, responds to voice. Following commands. Currently on BiPAP 60% 18/6. She is not in distress and denies shortness of breath. Skin is warm, dry. Color is pale. She is noted to be tachycardic, HR 110-140 and irregular. RN to obtain EKG.   VS: HR 111, RR 22, SpO2 92% on BiPAP 60% 18/6  1745: Upon returning to further evaluate, pt had requested BiPAP be removed and had an oxygen saturation of 55% on 15L HFNC. BiPAP replaced. MD and pt daughter updated.   VS: BP 139/61, HR 117, RR 27, SpO2 98% on BIPAP 60% 18/6  Interventions:  -No intervention from [place] of Care:  -Morphine PRN for comfort, code status updated  Event Summary:  MD Notified: Dr. Wynelle Cleveland Call Time: 1656 Arrival Time: 1700 Rapid response called away to another emergency at 1703. Pt in no distress, oxygen saturation 92% on BiPAP 60% 18/6. HR 132 bpm. Primary RN speaking with MD. Rapid response returned at 1745.  End Time: [place], RN

## 2020-11-12 NOTE — Progress Notes (Signed)
Evaluated patient and discussed with patient's daughter at bedside  Currently on BiPAP  Patient with very advanced chronic obstructive pulmonary disease, recurrent aspirations Severe multilobar pneumonia Significant deconditioning  Patient had indicated did not want to be on a ventilator   As documented by Georgann Housekeeper, CODE STATUS updated Comfort measures

## 2020-11-12 NOTE — Progress Notes (Signed)
NAME:  Megan [name], MRN:  [id number], DOB:  21-Jul-1951, LOS: 6 ADMISSION DATE:  11/06/2020, CONSULTATION DATE:  11/08/2019 REFERRING MD:  Dr Manuella Ghazi CHIEF COMPLAINT: Abnormal CT scan  Brief History:  70 year old female presents with acute respiratory distress with history of multiple pulmonary bacterial and viral infections.  Also has history of esophageal dysmotility.  PCCM consulted for further management of questionable right lower lobe necrotizing pneumonia.  No acute distress.  History of Present Illness:  Megan [name] is a 70 year old female with a past medical history significant for Covid pneumonia August 2021, prior MI, GERD, hypertension, COPD on chronic supplemental oxygen at baseline, congestive heart failure, and anxiety who presented to the emergency department with shortness of breath.  Patient states shortness of breath had improved after having Covid pneumonia August 2021.  She states that her supplemental oxygen post discharge was 2.5 liters continuously.  Of note patient was seen with questionable cavitary lesions in the left upper lobe but during that hospitalization.  Patient reports 2 days prior to this admission shortness of breath had worsened with productive cough as well.  She also reports subjective fever with chills and night sweats that began 2 days prior to admission.  On EMS arrival patient was seen hypoxic requiring placement of CPAP.  On ED arrival patient was transitioned to BiPAP to continue with increased work of breathing.  Vital signs significant on admission for temperature 100.1, tachycardia with a rate of 120-140, tachycardia and mild hypotension.  24.4, chloride 88, glucose 147, creatinine 1.21, high-sensitivity troponin 36, BNP 341.7, WBC 29.8, hemoglobin 10.5 and lactic acidosis 2.0.  Patient was admitted under hospitalist service for sepsis secondary to pneumonia and PCCM was consulted for further management  Past Medical History:  Admitted 11/07/2018.Covid  pneumonia August 2021, prior MI, GERD, hypertension, COPD on chronic supplemental oxygen at baseline, congestive heart failure, and anxiety   Significant Hospital Events:  Admitted 11/07/2018.  Consults:  Pulmonary  Procedures:  none  Significant Diagnostic Tests:  CT chest without contrast 11/07/2020 > Dense, near complete consolidation of the right lower lobe with cavitation in keeping with lobar, necrotizing pneumonia. Complete impaction of the right lower lobar airways. Moderate centrilobular emphysema with marked pulmonary hyperinflation. Multiple pulmonary nodules, likely inflammatory given their relatively rapid development since prior examination. Micro Data:  COVID 11/06/2020 > negative Blood cultures 11/06/2020 >  Culture 11/06/2020 >  MRSA PCR 11/07/2020 > positive Antimicrobials:   Levaquin 11/06/2020 Vancomycin 11/06/2020 Flagyl 11/06/2020-1/11 Interim History / Subjective:  Denies any significant complaints Able to clear secretions okay Remains very weak  Objective   Blood pressure (!) 139/52, pulse 80, temperature 98 F (36.7 C), temperature source Oral, resp. rate 14, height 5\' 3"  (1.6 m), weight 43.1 kg, SpO2 100 %.        Intake/Output Summary (Last 24 hours) at 11/12/2020 1524 Last data filed at 11/12/2020 0303 Gross per 24 hour  Intake 391.05 ml  Output 400 ml  Net -8.95 ml   Filed Weights   11/06/20 1929  Weight: 43.1 kg    Examination: General: Frail, following commands HENT: Moist oral mucosa Lungs: Decreased air movement bilaterally Cardiovascular: S1-S2 appreciated Abdomen: Bowel sounds appreciated  Resolved Hospital Problem list     Assessment & Plan:  Sepsis secondary to necrotizing pneumonia Sepsis appears to be improving -CT showing dense consolidation in the right lower lobe -Patient currently on antibiotics  Chronic hypoxic and hypercapnic respiratory failure -Continue oxygen supplementation  Severe/end-stage COPD -Continue  bronchodilators   History  of esophageal dysmotility -Speech evaluation and management   Complete 7 days of treatment with cefepime combined with vancomycin-MRSA PCR positive Leukocytosis better Afebrile  Sherrilyn Rist, MD [place] [facility] Pager: [phone]

## 2020-11-13 ENCOUNTER — Inpatient Hospital Stay (HOSPITAL_COMMUNITY): Payer: Medicare Other

## 2020-11-13 ENCOUNTER — Other Ambulatory Visit: Payer: Self-pay

## 2020-11-13 DIAGNOSIS — J9622 Acute and chronic respiratory failure with hypercapnia: Secondary | ICD-10-CM | POA: Diagnosis not present

## 2020-11-13 DIAGNOSIS — J189 Pneumonia, unspecified organism: Secondary | ICD-10-CM | POA: Diagnosis not present

## 2020-11-13 DIAGNOSIS — Z66 Do not resuscitate: Secondary | ICD-10-CM | POA: Diagnosis not present

## 2020-11-13 DIAGNOSIS — J9601 Acute respiratory failure with hypoxia: Secondary | ICD-10-CM | POA: Diagnosis not present

## 2020-11-13 DIAGNOSIS — R0600 Dyspnea, unspecified: Secondary | ICD-10-CM

## 2020-11-13 DIAGNOSIS — J9621 Acute and chronic respiratory failure with hypoxia: Secondary | ICD-10-CM | POA: Diagnosis not present

## 2020-11-13 DIAGNOSIS — J449 Chronic obstructive pulmonary disease, unspecified: Secondary | ICD-10-CM | POA: Diagnosis not present

## 2020-11-13 DIAGNOSIS — J42 Unspecified chronic bronchitis: Secondary | ICD-10-CM | POA: Diagnosis not present

## 2020-11-13 DIAGNOSIS — Z515 Encounter for palliative care: Secondary | ICD-10-CM

## 2020-11-13 DIAGNOSIS — Z7189 Other specified counseling: Secondary | ICD-10-CM

## 2020-11-13 LAB — BASIC METABOLIC PANEL
Anion gap: 12 (ref 5–15)
BUN: 28 mg/dL — ABNORMAL HIGH (ref 8–23)
CO2: 39 mmol/L — ABNORMAL HIGH (ref 22–32)
Calcium: 8.6 mg/dL — ABNORMAL LOW (ref 8.9–10.3)
Chloride: 87 mmol/L — ABNORMAL LOW (ref 98–111)
Creatinine, Ser: 0.56 mg/dL (ref 0.44–1.00)
GFR, Estimated: >60 mL/min (ref >60)
Glucose, Bld: 215 mg/dL — ABNORMAL HIGH (ref 70–99)
Potassium: 6.6 mmol/L (ref 3.5–5.1)
Sodium: 138 mmol/L (ref 135–145)

## 2020-11-13 LAB — CBC
HCT: 33.7 % — ABNORMAL LOW (ref 36.0–46.0)
Hemoglobin: 9.6 g/dL — ABNORMAL LOW (ref 12.0–15.0)
MCH: 29.8 pg (ref 26.0–34.0)
MCHC: 28.5 g/dL — ABNORMAL LOW (ref 30.0–36.0)
MCV: 104.7 fL — ABNORMAL HIGH (ref 80.0–100.0)
Platelets: 458 10*3/uL — ABNORMAL HIGH (ref 150–400)
RBC: 3.22 MIL/uL — ABNORMAL LOW (ref 3.87–5.11)
RDW: 13.9 % (ref 11.5–15.5)
WBC: 18 10*3/uL — ABNORMAL HIGH (ref 4.0–10.5)
nRBC: 0 % (ref 0.0–0.2)

## 2020-11-13 LAB — GLUCOSE, CAPILLARY
Glucose-Capillary: 178 mg/dL — ABNORMAL HIGH (ref 70–99)
Glucose-Capillary: 192 mg/dL — ABNORMAL HIGH (ref 70–99)
Glucose-Capillary: 272 mg/dL — ABNORMAL HIGH (ref 70–99)

## 2020-11-13 LAB — POTASSIUM: Potassium: 6 mmol/L — ABNORMAL HIGH (ref 3.5–5.1)

## 2020-11-13 MED ORDER — GLYCOPYRROLATE 0.2 MG/ML IJ SOLN: 0.2000 mg | INTRAMUSCULAR | Status: DC | PRN

## 2020-11-13 MED ORDER — HYDROMORPHONE BOLUS VIA INFUSION
0.2000 mg | INTRAVENOUS | Status: DC | PRN
  Filled 2020-11-13: qty 1

## 2020-11-13 MED ORDER — HYDROMORPHONE HCL PF 10 MG/ML IJ SOLN
0.5000 mg/h | INTRAMUSCULAR | Status: DC
Start: 2020-11-13 — End: 2020-11-14
  Filled 2020-11-13: qty 5

## 2020-11-13 MED ORDER — LORAZEPAM 2 MG/ML IJ SOLN
0.5000 mg | INTRAMUSCULAR | Status: DC | PRN
  Administered 2020-11-13: 0.5 mg via INTRAVENOUS
  Filled 2020-11-13: qty 1

## 2020-11-13 MED ORDER — BIOTENE DRY MOUTH MT LIQD: 15.0000 mL | OROMUCOSAL | Status: DC | PRN

## 2020-11-13 MED ORDER — GLYCOPYRROLATE 1 MG PO TABS: 1.0000 mg | ORAL_TABLET | ORAL | Status: DC | PRN

## 2020-11-13 MED ORDER — CALCIUM GLUCONATE-NACL 1-0.675 GM/50ML-% IV SOLN
1.0000 g | Freq: Once | INTRAVENOUS | Status: AC
  Administered 2020-11-13: 1000 mg via INTRAVENOUS
  Filled 2020-11-13: qty 50

## 2020-11-13 MED ORDER — HYDROMORPHONE HCL 1 MG/ML IJ SOLN
0.5000 mg | INTRAMUSCULAR | Status: DC | PRN
Start: 2020-11-13 — End: 2020-11-14
  Administered 2020-11-13 (×2): 1 mg via INTRAVENOUS
  Filled 2020-11-13 (×2): qty 1

## 2020-11-13 MED ORDER — POLYVINYL ALCOHOL 1.4 % OP SOLN
1.0000 [drp] | Freq: Four times a day (QID) | OPHTHALMIC | Status: DC | PRN
  Filled 2020-11-13: qty 15

## 2020-11-13 NOTE — Progress Notes (Signed)
PT Cancellation Note  Patient Details Name: Megan [name] MRN: [id number] DOB: October 23, 1951   Cancelled Treatment:    Reason Eval/Treat Not Completed: Other (comment)  Noted pt with decline in medical status and is transitioning to comfort care and likely home with hospice soon.  Noted MD note states pt expected to pass in next couple days.  Will sign off PT at this time. Abran Richard, PT Acute Rehab Services Pager [phone] [facility] [phone]     Karlton Lemon 11/13/2020, 12:03 PM

## 2020-11-13 NOTE — Progress Notes (Signed)
CRITICAL VALUE ALERT  Critical Value:  Potassium 6.6  Date & Time Notied:  11/13/2020 at 0536  Provider Notified:[place], MD at [phone]  Orders Received/Actions taken: New orders placed

## 2020-11-13 NOTE — Progress Notes (Signed)
NAME:  Makalya [name], MRN:  [id number], DOB:  25-Oct-1951, LOS: 7 ADMISSION DATE:  11/06/2020, CONSULTATION DATE:  11/08/2019 REFERRING MD:  Dr Manuella Ghazi CHIEF COMPLAINT: Abnormal CT scan  Brief History:  70 year old female presents with acute respiratory distress with history of multiple pulmonary bacterial and viral infections.  Also has history of esophageal dysmotility.  PCCM consulted for further management of questionable right lower lobe necrotizing pneumonia.  No acute distress. Events of 1/12 noted Still currently on BiPAP at present, unable to come off BiPAP without desaturating significantly  History of Present Illness:  Megan [name] is a 70 year old female with a past medical history significant for Covid pneumonia August 2021, prior MI, GERD, hypertension, COPD on chronic supplemental oxygen at baseline, congestive heart failure, and anxiety who presented to the emergency department with shortness of breath.  Patient states shortness of breath had improved after having Covid pneumonia August 2021.  She states that her supplemental oxygen post discharge was 2.5 liters continuously.  Of note patient was seen with questionable cavitary lesions in the left upper lobe but during that hospitalization.  Patient reports 2 days prior to this admission shortness of breath had worsened with productive cough as well.  She also reports subjective fever with chills and night sweats that began 2 days prior to admission.  On EMS arrival patient was seen hypoxic requiring placement of CPAP.  On ED arrival patient was transitioned to BiPAP to continue with increased work of breathing.  Vital signs significant on admission for temperature 100.1, tachycardia with a rate of 120-140, tachycardia and mild hypotension.  24.4, chloride 88, glucose 147, creatinine 1.21, high-sensitivity troponin 36, BNP 341.7, WBC 29.8, hemoglobin 10.5 and lactic acidosis 2.0.  Patient was admitted under hospitalist service for sepsis  secondary to pneumonia and PCCM was consulted for further management  Past Medical History:  Admitted 11/07/2018.Covid pneumonia August 2021, prior MI, GERD, hypertension, COPD on chronic supplemental oxygen at baseline, congestive heart failure, and anxiety   Significant Hospital Events:  Admitted 11/07/2018. Significant decompensation 11/12/2020 -Made DNR  Consults:  Pulmonary  Procedures:  none  Significant Diagnostic Tests:  CT chest without contrast 11/07/2020 > Dense, near complete consolidation of the right lower lobe with cavitation in keeping with lobar, necrotizing pneumonia. Complete impaction of the right lower lobar airways. Moderate centrilobular emphysema with marked pulmonary hyperinflation. Multiple pulmonary nodules, likely inflammatory given their relatively rapid development since prior examination.  Micro Data:  COVID 11/06/2020 > negative Blood cultures 11/06/2020 >  Culture 11/06/2020 >  MRSA PCR 11/07/2020 > positive Antimicrobials:   Levaquin 11/06/2020 Vancomycin 11/06/2020 Flagyl 11/06/2020-1/11  Interim History / Subjective:  Events on 11/12/2020 noted Now awake and interactive Gets agitated Patient is clear she does not want to be on the ventilator  Objective   Blood pressure 119/63, pulse (!) 109, temperature 98.2 F (36.8 C), temperature source Axillary, resp. rate (!) 23, height 5\' 3"  (1.6 m), weight 43.1 kg, SpO2 100 %.    FiO2 (%):  [60 %] 60 %  No intake or output data in the 24 hours ending 11/13/20 0811 Filed Weights   11/06/20 1929  Weight: 43.1 kg    Examination: General: Frail, following commands, on BiPAP Lungs: Decreased air movement bilaterally Cardiovascular: S1-S2 appreciated Abdomen: Bowel sounds appreciated Extremities: No edema, no clubbing  Resolved Hospital Problem list     Assessment & Plan:  Acute hypoxemic respiratory failure secondary to sepsis from necrotizing pneumonia Acute decompensation of her respiratory status on  11/12/2020 -  She remains on BiPAP at the present time -Unable to come off BiPAP without desaturating  Patient is clear she does not want to be on a ventilator I do not believe the ventilator will be in her best interest which was clearly discussed with patient's daughter on 11/12/2020 I do not believe she will be able to come off the ventilator and with patient being clear she does not want to be on a ventilator-this may be a treatment option that is not in her best interest  I was clear in my discussion with her daughter on 11/12/2020 that making her DNR does not mean we will not continue to treat her, just means we will not resuscitate if she were to have a cardiorespiratory arrest  Continue BiPAP as long as patient continues to tolerate it  Severe/end-stage COPD -Continue bronchodilators -Continue steroids  History of esophageal dysmotility -Speech evaluation and management  Continues on antibiotics currently on cefepime and vancomycin  Severe/end-stage COPD -Continue bronchodilators   History of esophageal dysmotility -Speech evaluation and management  Lab data this morning revealing hypokalemia -This is being repeated -Treat if not related to hemolysis  Will obtain a chest x-ray on today   Sherrilyn Rist, MD [place] Pager: [phone]

## 2020-11-13 NOTE — Consult Note (Signed)
Consultation Note Date: 11/13/2020   Patient Name: Megan [name]  DOB: [birth year]  MRN: [id number]  Age / Sex: 70 y.o., female  PCP: [name], MD Referring Physician: Debbe Odea, MD  Reason for Consultation: Establishing goals of care  HPI/Patient Profile: 70 y.o. female  with past medical history of end-stage COPD on trilogy at home, hypertension, and COVID pneumonia who presented to the emergency department on 11/06/2020 with worsening shortness of breath. CT chest showed dense near complete consolidation of the right lower lobe with cavitation suggestion lobar, necrotizing pneumonia.  On 1/12, she developed acute respiratory distress with desaturation to the 50's. Patient declined intubation and code status was changed to DNR.   Clinical Assessment and Goals of Care: I have reviewed medical records including EPIC notes, labs and imaging, examined the patient and received report from the primary RN. Patient appears comfortable - she is currently on oxygen at 2L. I met at bedside with daughter Megan [name] to discuss diagnosis, prognosis, [place], EOL wishes, disposition, and options.  I introduced Palliative Medicine as specialized medical care for people living with serious illness. It focuses on providing relief from the symptoms and stress of a serious illness.   We discussed a brief life review of the patient. She is originally from [place]; relocated to [place] about 4 years ago. She has 2 living children. 1 son died 8 years ago, and the other son is currently in rehab for substance abuse. Patient lives with Megan [name] and her 28 young children.   As far as functional status, patient's activity is significantly limited due to shortness of breath. Megan [name] states "everyday is a struggle". Nutritional status is poor, she has severe malnutrition related to chronic disease. Most recent albumin (1/7) is 2.1.  We discussed her current  illness and what it means in the larger context of her ongoing co-morbidities. Discuss that COPD is a progressive and eventually terminal disease. Megan [name] expresses that her mother is an incredibly strong women to have made it this far with her illness.   I attempted to elicit values and goals of care important to the patient. Her family and grandchildren (Casey's children) are the most important aspect of her life. Megan [name] thinks she has "pushed herself" to keep going for them.  The difference between aggressive medical intervention and comfort care was considered in light of the patient's goals of care. Reviewed the concept of a comfort path with the goal of comfort and dignity rather than prolonging life. Reviewed hospice philosophy and provided information on home vs residential hospice services.  Megan [name] expresses that would like to take her mother home with hospice if possible. Discussed that if patient stayed in her current condition (comfortable on 2L oxygen), she would be stable for transfer home.   Megan [name] expresses that she doesn't want her mother to suffer any longer. We discussed the possibility that she could go into respiratory distress again. If this happens, my recommendation would be starting a dilaudid infusion and not utilizing BiPAP. Megan [name] agrees with this plan of  care.   Discussed transitioning to comfort care while in the hospital, and what that would look like--keeping her clean and dry, no labs, no artificial hydration or feeding, minimizing of medications, comfort feeds, medication for pain and dyspnea.   Questions and concerns were addressed. I provided Megan [name] with my contact information and encouraged her to call with questions or concerns.    Primary decision maker: Megan [name] (daughter) [phone]    SUMMARY OF RECOMMENDATIONS    Full comfort measures initiated  DNR/DNI as previously documented  Home with hospice if patient remains stable - daughter requests [facility] and  will need a hospital bed and suction  Added orders for symptom management at EOL as well as discontinued orders that were not focused on comfort  Continue antibiotics for now  Unrestricted visitation orders were placed per current [facility] EOL visitation policy   Provide frequent assessments and administer PRN medications as clinically necessary to ensure EOL comfort  If patient develops respiratory distress, start dilaudid infusion as ordered  PMT will continue to follow holistically   Symptom Management:   Hydromorphone (DILAUDID) prn for pain or dyspnea  Lorazepam (ATIVAN) prn for anxiety  Glycopyrrolate (ROBINUL) for excessive secretions  Ondansetron (ZOFRAN) prn for nausea  Polyvinyl alcohol (LIQUIFILM TEARS) prn for dry eyes  Antiseptic oral rinse (BIOTENE) prn for dry mouth   Palliative Prophylaxis:   Aspiration and Frequent Pain Assessment  Additional Recommendations (Limitations, Scope, Preferences):  Full Comfort Care  Psycho-social/Spiritual:   Created space and opportunity for family to express thoughts and feelings regarding patient's current medical situation.   Emotional support provided   Prognosis:   < 2 weeks  Discharge Planning: home with hospice versus hospital death      Primary Diagnoses: Present on Admission: . CAP (community acquired pneumonia) . Sepsis ([place]) . COPD (chronic obstructive pulmonary disease) ([place]) . Acute respiratory failure with hypoxia ([place]) . Hyperglycemia   I have reviewed the medical record, interviewed the patient and family, and examined the patient. The following aspects are pertinent.  Past Medical History:  Diagnosis Date  . Anemia   . Anxiety state 08/20/2015  . CAP (community acquired pneumonia)   . CHF (congestive heart failure) ([place])   . COPD (chronic obstructive pulmonary disease) ([place])   . Depression   . Essential hypertension 08/19/2015  . GERD (gastroesophageal reflux disease)   .  Headache   . History of hiatal hernia   . Myocardial infarction ([place])   . Shortness of breath dyspnea     Family History  Problem Relation Age of Onset  . Cirrhosis Mother   . Depression Mother   . Heart disease Father    Scheduled Meds: . feeding supplement  237 mL Oral TID BM  . umeclidinium bromide  1 puff Inhalation Daily   And  . fluticasone furoate-vilanterol  1 puff Inhalation Daily  . lidocaine  2 patch Transdermal Q24H  . methylPREDNISolone (SOLU-MEDROL) injection  40 mg Intravenous Q8H   Continuous Infusions: . ceFEPime (MAXIPIME) IV 2 g (11/13/20 1259)  . vancomycin 1,000 mg (11/12/20 2145)   PRN Meds:.acetaminophen **OR** acetaminophen, butalbital-acetaminophen-caffeine, HYDROmorphone (DILAUDID) injection, levalbuterol, LORazepam, ondansetron (ZOFRAN) IV Medications Prior to Admission:  Prior to Admission medications   Medication Sig Start Date End Date Taking? Authorizing Provider  albuterol (VENTOLIN HFA) 108 (90 Base) MCG/ACT inhaler INHALE 2 PUFFS BY MOUTH EVERY 6 HOURS AS NEEDED FOR WHEEZING OR SHORTNESS OF BREATH Patient taking differently: Inhale 2 puffs into the lungs every 6 (six)  hours as needed for wheezing or shortness of breath. 08/07/20  Yes [name], MD  ALPRAZolam Duanne Moron) 0.25 MG tablet TAKE 1 TABLET BY MOUTH TWICE DAILY AS NEEDED Patient taking differently: Take 0.25 mg by mouth 2 (two) times daily as needed for anxiety. 08/08/20  Yes [name], MD  aspirin EC 81 MG tablet Take 81 mg by mouth daily.   Yes [provider]  azelastine (ASTELIN) 0.1 % nasal spray Place 1 spray into both nostrils 2 (two) times daily. Use in each nostril as directed 06/12/19  Yes Martyn Ehrich, NP  Butalbital-APAP-Caffeine [phone] MG capsule Take 1 capsule by mouth 2 (two) times daily as needed for headache. 07/28/20  Yes [provider]  diltiazem (CARDIZEM CD) 120 MG 24 hr capsule Take 1 capsule (120 mg total) by mouth daily. 06/18/20  Yes  Thurnell Lose, MD  feeding supplement, ENSURE ENLIVE, (ENSURE ENLIVE) LIQD Take 237 mLs by mouth 4 (four) times daily. Patient taking differently: Take 237 mLs by mouth daily. 01/11/17  Yes Eugenie Filler, MD  fluticasone (FLONASE) 50 MCG/ACT nasal spray SHAKE LIQUID AND USE 2 SPRAYS IN EACH NOSTRIL DAILY Patient taking differently: Place 2 sprays into both nostrils daily as needed for allergies. 07/25/19  Yes Collene Gobble, MD  guaiFENesin (MUCINEX) 600 MG 12 hr tablet Take 2 tablets (1,200 mg total) by mouth 2 (two) times daily. Patient taking differently: Take 1,200 mg by mouth 2 (two) times daily as needed for cough or to loosen phlegm. 12/10/19  Yes Martyn Ehrich, NP  hydrochlorothiazide (MICROZIDE) 12.5 MG capsule Take 1 capsule (12.5 mg total) by mouth daily. 08/07/20 08/07/21 Yes [name], MD  ipratropium-albuterol (DUONEB) 0.5-2.5 (3) MG/3ML SOLN Take 3 mLs by nebulization every 6 (six) hours as needed. 08/07/20  Yes [name], MD  loratadine (CLARITIN) 10 MG tablet Take 1 tablet (10 mg total) by mouth daily. Patient taking differently: Take 10 mg by mouth daily as needed for allergies. 11/14/19  Yes [name], MD  Multiple Vitamin (TAB-A-VITE/BETA CAROTENE) TABS Take 1 tablet by mouth daily. 01/01/14  Yes [provider]  ondansetron (ZOFRAN) 4 MG tablet Take 1 tablet (4 mg total) by mouth every 8 (eight) hours as needed for nausea or vomiting. 06/17/20  Yes Thurnell Lose, MD  pantoprazole (PROTONIX) 40 MG tablet TAKE 1 TABLET(40 MG) BY MOUTH DAILY Patient taking differently: Take 40 mg by mouth daily. 12/10/19  Yes [name], MD  polyethylene glycol [facility] / Floria Raveling) packet Take 17 g by mouth 2 (two) times daily. Patient taking differently: Take 17 g by mouth daily as needed for mild constipation. 01/11/17  Yes Eugenie Filler, MD  predniSONE (DELTASONE) 20 MG tablet Take 1 tablet (20 mg total) by mouth daily with breakfast. 08/13/20  Yes Byrum, Rose Fillers, MD  senna-docusate (SENOKOT-S) 8.6-50 MG tablet Take 1 tablet by mouth at bedtime as needed for mild constipation. 06/17/20  Yes Thurnell Lose, MD  TRELEGY ELLIPTA 100-62.5-25 MCG/INH AEPB INHALE 1 PUFF INTO THE LUNGS DAILY 02/26/20  Yes Collene Gobble, MD  lidocaine (LIDODERM) 5 % Place 1 patch onto the skin daily. Remove & Discard patch within 12 hours or asd Patient not taking: No sig reported 07/17/20   [name], MD   Allergies  Allergen Reactions  . Penicillins Rash and Hives    Has patient had a PCN reaction causing immediate rash, facial/tongue/throat swelling, SOB or lightheadedness  with hypotension: No Has patient had a PCN reaction causing severe rash involving mucus membranes or skin necrosis:NO Has patient had a PCN reaction that required hospitalization No Has patient had a PCN reaction occurring within the last 10 years:NO If all of the above answers are "NO", then may proceed with Cephalosporin use.  Marland Kitchen Morphine And Related Other (See Comments)    Made the patient incoherent and she "talked out her head"  . Cephalexin Rash and Other (See Comments)    Flushed, also   Review of Systems  Unable to perform ROS: Other    Physical Exam Vitals reviewed.  Constitutional:      General: She is not in acute distress.    Appearance: She is cachectic. She is ill-appearing.     Comments: Somnolent  Cardiovascular:     Rate and Rhythm: Tachycardia present.  Pulmonary:     Effort: Pulmonary effort is normal.     Vital Signs: BP (!) 141/59 (BP Location: Right Arm)   Pulse (!) 116   Temp 97.8 F (36.6 C) (Axillary)   Resp 19   Ht 5' 3" (1.6 m)   Wt 43.1 kg   SpO2 98%   BMI 16.83 kg/m  Pain Scale: 0-10   Pain Score: 3    SpO2: SpO2: 98 % O2 Device:SpO2: 98 % O2 Flow Rate: .O2 Flow Rate (L/min): 2.5 L/min  IO: Intake/output summary: No intake or output data in the 24 hours ending 11/13/20 1635  LBM: Last BM Date: 11/08/20 Baseline Weight: Weight: 43.1  kg Most recent weight: Weight: 43.1 kg      Palliative Assessment/Data: PPS 20%     Time In: 17:00 Time Out: 18:12 Time Total: 72 minutes Greater than 50%  of this time was spent counseling and coordinating care related to the above assessment and plan.  Signed by: Lavena Bullion, NP   Please contact Palliative Medicine Team phone at [phone] for questions and concerns.  For individual provider: See Shea Evans

## 2020-11-13 NOTE — Progress Notes (Signed)
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  Work-up revealed right-sided consolidation with pleural effusion and CT showed cavitary pneumonia .  Patient was transitioned to a BiPAP and started on broad-spectrum antibiotics.  She was liberated from BiPAP on 1/7 but continued to require high level of oxygen.  The patient was admitted in August of last year with COVID pneumonia and she was discharged home with 2 L of oxygen.   Subjective: She is still short of breath this AM when taken off of the BiPAP. She is also very confused.     Assessment & Plan:   Principal Problem: Cavitary pneumonia/  Sepsis-suspected chronic aspiration Acute on chronic hypoxemic and hypercapnic respiratory failure Dysphagia secondary to esophageal dysmotility - When the patient was last admitted in August, she had a 2.8 x 2.8 cm cavitation of the left upper lobe. - It was suspected at that time that she may suffer from chronic aspiration -She was also noted to be MRSA PCR positive on this admission - she is receiving Cefepime and Vancomycin - due to increasing respiratory distress on 1/12, I added Solumedrol. Suspect her increasing distress is either due to mucous trapping in small airways or another aspiration event. The patient stated to me yesterday that she does not want to be intubated again. She did wear a BiPAP through the night. - appreciate PCCM assistance in transitioning her to DNR- her daughter has guilt that she is letting her mother die- I have reassured her today  Plan:  Patient is quite short of breath today and is also confused. We are moving towards comfort care for her. She was transitioned to DNR  yesterday and receive 1 dose of Morphine last night. I have had an extensive conversation with her daughter who has accepted that her mother is likely going to pass in the next couple of days and has requested that she take her home with hospice. At this moment, I do not feel that she is stable to dc from the hospital as moving her may exacerbate her respiratory distress and cause her death in route to home. The daughter understands this. Morphine may be causing increased confusion as is listed on her allergy list as causing her to "talk our of her head" .   Will start PRN Ativan and Dilaudid today.  D/C all oral meds- cont IV Antibiotics and IV steroids for now. Cont BiPAP PRN. Patient can drink liquids for comfort. 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 - Possibly due to chronic steroids - Last hemoglobin A1c was 6.7 - dc CBGs  Pulmonary hypertension - Noted on 2D echo on 11/08/2020-likely secondary to underlying emphysema  Hypertension/sinus tachycardia - dc Cardizem today -DC HCTZ   Time spent in minutes: 40 DVT prophylaxis: enoxaparin (LOVENOX) injection 30 mg Start: 11/09/20 1000 Code Status:  DNR Family Communication:  Disposition Plan:  Status is: Inpatient  Remains inpatient appropriate because:Ongoing respiratory failure, pneumonia being treated with IV antibiotics- likely will die in the hospital   Dispo: The patient is from: Home              Anticipated d/c is to: TBD              Anticipated d/c date is: > 3 days              Patient currently is not  medically stable to d/c.  Consultants:   Pulmonary critical care Procedures:   BiPAP Antimicrobials:  Anti-infectives (From admission, onward)   Start     Dose/Rate Route Frequency Ordered Stop   11/11/20 1100  ceFEPIme (MAXIPIME) 2 g in sodium chloride 0.9 % 100 mL IVPB        2 g 200 mL/hr over 30 Minutes Intravenous Every 12 hours 11/11/20 1001     11/09/20 2130  vancomycin (VANCOCIN) IVPB 1000 mg/200 mL premix        1,000 mg 200 mL/hr over 60 Minutes Intravenous Every 36 hours 11/09/20 0905     11/08/20 2000  levofloxacin (LEVAQUIN) IVPB 750 mg        750 mg 100 mL/hr over 90 Minutes Intravenous Every 48 hours 11/07/20 0149 11/10/20 2305   11/08/20 0930  vancomycin (VANCOREADY) IVPB 750 mg/150 mL  Status:  Discontinued        750 mg 150 mL/hr over 60 Minutes Intravenous Every 36 hours 11/06/20 2130 11/09/20 0905   11/07/20 0800  metroNIDAZOLE (FLAGYL) IVPB 500 mg  Status:  Discontinued        500 mg 100 mL/hr over 60 Minutes Intravenous Every 8 hours 11/07/20 0744 11/11/20 1247   11/07/20 0145  levofloxacin (LEVAQUIN) IVPB 750 mg  Status:  Discontinued        750 mg 100 mL/hr over 90 Minutes Intravenous Every 24 hours 11/07/20 0136 11/07/20 0148   11/06/20 2045  vancomycin (VANCOCIN) IVPB 1000 mg/200 mL premix        1,000 mg 200 mL/hr over 60 Minutes Intravenous  Once 11/06/20 2033 11/06/20 2230   11/06/20 2030  levofloxacin (LEVAQUIN) IVPB 750 mg        750 mg 100 mL/hr over 90 Minutes Intravenous  Once 11/06/20 2016 11/06/20 2230       Objective: Vitals:   11/13/20 0600 11/13/20 0744 11/13/20 0747 11/13/20 0800  BP: 119/63   (!) 131/54  Pulse: (!) 104 (!) 10 (!) 109 96  Resp: (!) 21 (!) 23 (!) 23 20  Temp:    97.8 F (36.6 C)  TempSrc:    Axillary  SpO2: 90% 100% 100% 100%  Weight:      Height:       No intake or output data in the 24 hours ending 11/13/20 1105 Filed Weights   11/06/20 1929  Weight: 43.1 kg    Examination: General exam:   -frail-appearing female lying in bed who appears comfortable HEENT: PERRLA, oral mucosa dry, no sclera icterus or thrush Respiratory system: Positive for rhonchi -. Respiratory effort normal. Cardiovascular system: S1 & S2 heard, RRR.   Gastrointestinal system: Abdomen soft, non-tender, nondistended. Normal bowel sounds. Central nervous system: Alert and oriented. No focal neurological deficits. Extremities: No cyanosis, clubbing or edema Skin: No rashes or ulcers Psychiatry:  Mood & affect appropriate.     Data Reviewed: I have personally reviewed following labs and imaging studies  CBC: Recent Labs  Lab 11/06/20 2200 11/07/20 0423 11/08/20 0516 11/09/20 0107 11/10/20 0844 11/11/20 0429 11/12/20 0227 11/13/20 0457  WBC 29.8* 31.9*   < > 21.3* 17.6* 13.4* 14.2* 18.0*  NEUTROABS 26.0* 30.0*  --   --   --   --  12.6*  --   HGB 10.5* 10.0*   < > 9.2* 10.2* 8.7* 9.8* 9.6*  HCT 36.2 33.0*   < > 30.3* 34.3* 32.0* 33.3* 33.7*  MCV 104.6* 99.4   < > 97.1 100.9* 104.9* 103.1* 104.7*  PLT 432* 488*   < > 498* 480* 399 322 458*   < > = values in this interval not displayed.   Basic Metabolic Panel: Recent Labs  Lab 11/08/20 0516 11/09/20 0107 11/10/20 0350 11/10/20 0844 11/11/20 0429 11/12/20 0440 11/13/20 0457 11/13/20 0843  NA 137 137  --  139 140 140 138  --   K 4.5 4.4  --  5.4* 5.1 4.8 6.6* 6.0*  CL 89* 88*  --  89* 90* 87* 87*  --   CO2 40* 39*  --  41* 42* 48* 39*  --   GLUCOSE 180* 198*  --  145* 142* 202* 215*  --   BUN 32* 40*  --  21 23 17  28*  --   CREATININE 0.80 0.74  --  0.60 0.55 0.44 0.56  --   CALCIUM 9.1 9.3  --  9.0 8.5* 8.7* 8.6*  --   MG 1.9 1.5* 2.0  --   --  1.8  --   --    GFR: Estimated Creatinine Clearance: 46.4 mL/min (by C-G formula based on SCr of 0.56 mg/dL). Liver Function Tests: Recent Labs  Lab 11/06/20 1932 11/07/20 0423  AST 12* 11*  ALT 9 8  ALKPHOS 128* 95  BILITOT 0.9 0.5  PROT 6.6 5.4*  ALBUMIN 2.7* 2.1*   No results for  input(s): LIPASE, AMYLASE in the last 168 hours. No results for input(s): AMMONIA in the last 168 hours. Coagulation Profile: Recent Labs  Lab 11/06/20 2132  INR 1.4*   Cardiac Enzymes: No results for input(s): CKTOTAL, CKMB, CKMBINDEX, TROPONINI in the last 168 hours. BNP (last 3 results) No results for input(s): PROBNP in the last 8760 hours. HbA1C: No results for input(s): HGBA1C in the last 72 hours. CBG: Recent Labs  Lab 11/12/20 1156 11/12/20 1616 11/12/20 2212 11/13/20 0841 11/13/20 0842  GLUCAP 109* 101* 177* 192* 178*   Lipid Profile: No results for input(s): CHOL, HDL, LDLCALC, TRIG, CHOLHDL, LDLDIRECT in the last 72 hours. Thyroid Function Tests: No results for input(s): TSH, T4TOTAL, FREET4, T3FREE, THYROIDAB in the last 72 hours. Anemia Panel: Recent Labs    11/11/20 0429  VITAMINB12 244   Urine analysis:    Component Value Date/Time   COLORURINE YELLOW 08/07/2020 1217   APPEARANCEUR CLEAR 08/07/2020 1217   LABSPEC 1.015 08/07/2020 1217   PHURINE 7.5 08/07/2020 1217   GLUCOSEU NEGATIVE 08/07/2020 1217   HGBUR NEGATIVE 08/07/2020 1217   BILIRUBINUR NEGATIVE 08/07/2020 1217   KETONESUR NEGATIVE 08/07/2020 1217   PROTEINUR 100 (A) 11/04/2018 1622   UROBILINOGEN 0.2 08/07/2020 1217   NITRITE NEGATIVE 08/07/2020 1217   LEUKOCYTESUR NEGATIVE 08/07/2020 1217   Sepsis Labs: @LABRCNTIP (procalcitonin:4,lacticidven:4) ) Recent Results (from the past 240 hour(s))  Resp Panel by RT-PCR (Flu A&B, Covid) Nasopharyngeal Swab     Status: None   Collection Time: 11/06/20  7:35 PM   Specimen: Nasopharyngeal Swab; Nasopharyngeal(NP) swabs in vial transport medium  Result Value Ref Range Status   SARS Coronavirus 2 by RT PCR NEGATIVE NEGATIVE Final    Comment: (NOTE) SARS-CoV-2 target nucleic acids are NOT DETECTED.  The SARS-CoV-2 RNA is generally detectable in upper respiratory specimens during the acute phase of infection. The lowest concentration of  SARS-CoV-2 viral copies this assay can detect is 138 copies/mL. A negative result does not preclude SARS-Cov-2 infection and should not be used as the sole basis for treatment or other patient management decisions. A negative result may occur with  improper specimen collection/handling, submission of specimen other than nasopharyngeal swab, presence of viral mutation(s) within the areas targeted by this assay, and inadequate number of viral copies(<138 copies/mL). A negative result must be combined with clinical observations, patient history, and epidemiological information. The expected result is Negative.  Fact Sheet for Patients:  EntrepreneurPulse.com.au  Fact Sheet for Healthcare Providers:  IncredibleEmployment.be  This test is no t yet approved or cleared by the [place] FDA and  has been authorized for detection and/or diagnosis of SARS-CoV-2 by FDA under an Emergency Use Authorization (EUA). This EUA will remain  in effect (meaning this test can be used) for the duration of the COVID-19 declaration under Section 564(b)(1) of the Act, 21 U.S.C.section 360bbb-3(b)(1), unless the authorization is terminated  or revoked sooner.       Influenza A by PCR NEGATIVE NEGATIVE Final   Influenza B by PCR NEGATIVE NEGATIVE Final    Comment: (NOTE) The Xpert Xpress SARS-CoV-2/FLU/RSV plus assay is intended as an aid in the diagnosis of influenza from Nasopharyngeal swab specimens and should not be used as a sole basis for treatment. Nasal washings and aspirates are unacceptable for Xpert Xpress SARS-CoV-2/FLU/RSV testing.  Fact Sheet for Patients: EntrepreneurPulse.com.au  Fact Sheet for Healthcare Providers: IncredibleEmployment.be  This test is not yet approved or cleared by the [place] FDA and has been authorized for detection and/or diagnosis of SARS-CoV-2 by FDA under an Emergency Use  Authorization (EUA). This EUA will remain in effect (meaning this test can be used) for the duration of the COVID-19 declaration under Section 564(b)(1) of the Act, 21 U.S.C. section 360bbb-3(b)(1), unless the authorization is terminated or revoked.  Performed at [facility], [place] [address], [place], [place] [address]   Culture, blood (routine x 2)     Status: None   Collection Time: 11/06/20  9:40 PM   Specimen: BLOOD  Result Value Ref Range Status   Specimen Description BLOOD SITE NOT SPECIFIED  Final   Special Requests   Final    BOTTLES DRAWN AEROBIC AND ANAEROBIC Blood Culture adequate volume   Culture   Final    NO GROWTH 5 DAYS Performed at [facility], [address], [place], [place] [address]    Report Status 11/11/2020 FINAL  Final  Culture, blood (routine x 2)     Status: None   Collection Time: 11/06/20 10:20 PM   Specimen: BLOOD LEFT HAND  Result Value Ref Range Status   Specimen Description BLOOD LEFT HAND [facility] VANC  Final   Special Requests   Final    BOTTLES DRAWN AEROBIC AND ANAEROBIC Blood Culture adequate volume   Culture   Final    NO GROWTH 5 DAYS Performed at [facility], [place] [address], [place], [place] [address]    Report Status 11/12/2020 FINAL  Final  MRSA PCR Screening     Status: Abnormal   Collection Time: 11/07/20  5:35 AM  Specimen: Nasopharyngeal  Result Value Ref Range Status   MRSA by PCR POSITIVE (A) NEGATIVE Final    Comment:        The GeneXpert MRSA Assay (FDA approved for NASAL specimens only), is one component of a comprehensive MRSA colonization surveillance program. It is not intended to diagnose MRSA infection nor to guide or monitor treatment for MRSA infections. RESULT CALLED TO, READ BACK BY AND VERIFIED WITH: Herschel Senegal RN 8:35 11/07/20 ([email or web address]) Performed at [facility], [place] [address], [place], [place] [address]   Expectorated sputum assessment w rflx to resp cult     Status: None    Collection Time: 11/10/20  8:42 AM   Specimen: Expectorated Sputum  Result Value Ref Range Status   Specimen Description EXPSU  Final   Special Requests NONE  Final   Sputum evaluation   Final    THIS SPECIMEN IS ACCEPTABLE FOR SPUTUM CULTURE Performed at [facility], [place] [address] [place], [place] [address]    Report Status 11/12/2020 FINAL  Final  Culture, respiratory     Status: None (Preliminary result)   Collection Time: 11/10/20  8:42 AM  Result Value Ref Range Status   Specimen Description EXPSU  Final   Special Requests NONE Reflexed from [id number]  Final   Gram Stain   Final    RARE WBC PRESENT, PREDOMINANTLY PMN NO ORGANISMS SEEN    Culture   Final    RARE STAPHYLOCOCCUS AUREUS SUSCEPTIBILITIES TO FOLLOW Performed at [facility], [place] [address] [place], [place] [address]    Report Status PENDING  Incomplete         Radiology Studies: DG CHEST PORT 1 VIEW  Result Date: 11/13/2020 CLINICAL DATA:  Respiratory failure EXAM: PORTABLE CHEST 1 VIEW COMPARISON:  November 06, 2020. FINDINGS: Portions of the lung bases not included. There are apparent nipple shadows bilaterally. There is ill-defined airspace opacity in both lower lung regions, present on most recent study on the right with questionable new opacity left base. Heart size and pulmonary vascularity normal. No adenopathy. There is aortic atherosclerosis. No bone lesions. IMPRESSION: Airspace opacity in the lung bases, incompletely visualized. Suspect new potential pleural effusion versus airspace opacity left base with evidence suggesting right pleural effusion with patchy airspace opacity right base. There may be partial clearing of infiltrate from the right base; this area is incompletely visualized making assessment difficult. Stable cardiac silhouette. Aortic Atherosclerosis (ICD10-I70.0). Electronically Signed   By: Lowella Grip III M.D.   On: 11/13/2020 09:21      Scheduled Meds: . enoxaparin  (LOVENOX) injection  30 mg Subcutaneous Q24H  . feeding supplement  237 mL Oral TID BM  . umeclidinium bromide  1 puff Inhalation Daily   And  . fluticasone furoate-vilanterol  1 puff Inhalation Daily  . lidocaine  2 patch Transdermal Q24H  . methylPREDNISolone (SOLU-MEDROL) injection  40 mg Intravenous Q8H  . multivitamin with minerals  1 tablet Oral Daily  . polyethylene glycol  17 g Oral Daily   Continuous Infusions: . calcium gluconate    . ceFEPime (MAXIPIME) IV 2 g (11/12/20 2309)  . vancomycin 1,000 mg (11/12/20 2145)     LOS: 7 days      Debbe Odea, MD [facility] Pager: [email or web address] 11/13/2020, 11:05 AM

## 2020-11-13 NOTE — Plan of Care (Signed)

## 2020-11-14 ENCOUNTER — Telehealth: Payer: Self-pay | Admitting: Internal Medicine

## 2020-11-14 ENCOUNTER — Other Ambulatory Visit (HOSPITAL_COMMUNITY): Payer: Self-pay | Admitting: Infectious Disease

## 2020-11-14 ENCOUNTER — Telehealth: Payer: Self-pay | Admitting: Emergency Medicine

## 2020-11-14 DIAGNOSIS — R1310 Dysphagia, unspecified: Secondary | ICD-10-CM | POA: Diagnosis not present

## 2020-11-14 DIAGNOSIS — J42 Unspecified chronic bronchitis: Secondary | ICD-10-CM | POA: Diagnosis not present

## 2020-11-14 DIAGNOSIS — J9601 Acute respiratory failure with hypoxia: Secondary | ICD-10-CM | POA: Diagnosis not present

## 2020-11-14 DIAGNOSIS — J189 Pneumonia, unspecified organism: Secondary | ICD-10-CM | POA: Diagnosis not present

## 2020-11-14 DIAGNOSIS — J9621 Acute and chronic respiratory failure with hypoxia: Secondary | ICD-10-CM | POA: Diagnosis not present

## 2020-11-14 LAB — CULTURE, RESPIRATORY W GRAM STAIN

## 2020-11-14 LAB — GLUCOSE, CAPILLARY
Glucose-Capillary: 257 mg/dL — ABNORMAL HIGH (ref 70–99)
Glucose-Capillary: 193 mg/dL — ABNORMAL HIGH (ref 70–99)

## 2020-11-14 MED ORDER — INSULIN ASPART 100 UNIT/ML ~~LOC~~ SOLN: 0.0000 [IU] | Freq: Every day | SUBCUTANEOUS | Status: DC

## 2020-11-14 MED ORDER — HYDROMORPHONE HCL 2 MG PO TABS: 1.0000 mg | ORAL_TABLET | ORAL | 0 refills | Status: DC | PRN

## 2020-11-14 MED ORDER — HYDROMORPHONE HCL 2 MG PO TABS: 1.0000 mg | ORAL_TABLET | ORAL | Status: DC | PRN

## 2020-11-14 MED ORDER — GLYCOPYRROLATE 1 MG PO TABS: 1.0000 mg | ORAL_TABLET | ORAL | 0 refills | Status: DC | PRN

## 2020-11-14 MED ORDER — LORAZEPAM 1 MG PO TABS: 1.0000 mg | ORAL_TABLET | ORAL | 0 refills | Status: DC | PRN

## 2020-11-14 MED ORDER — INSULIN ASPART 100 UNIT/ML ~~LOC~~ SOLN
0.0000 [IU] | Freq: Three times a day (TID) | SUBCUTANEOUS | Status: DC
  Administered 2020-11-14: 5 [IU] via SUBCUTANEOUS

## 2020-11-14 MED ORDER — LORAZEPAM 1 MG PO TABS: 1.0000 mg | ORAL_TABLET | ORAL | Status: DC | PRN

## 2020-11-14 MED ORDER — SODIUM ZIRCONIUM CYCLOSILICATE 10 G PO PACK
10.0000 g | PACK | Freq: Once | ORAL | Status: AC
  Administered 2020-11-14: 10 g via ORAL
  Filled 2020-11-14: qty 1

## 2020-11-14 MED FILL — HYDROmorphone HCL 2 MG TABS: 2 | 10 days supply | Qty: 30 | Fill #0

## 2020-11-14 MED FILL — LORazepam 1 MG TABS: 1 | 7 days supply | Qty: 30 | Fill #0

## 2020-11-14 MED FILL — GLYCOPYRROLATE 1 MG TABLET: 1 | 4 days supply | Qty: 20 | Fill #0

## 2020-11-14 NOTE — Progress Notes (Signed)
Pharmacy Antibiotic Note  Megan [name] is a 70 y.o. female admitted on 11/06/2020 with cavitary pneumonia of the right lower lobe (questionable necrotizing PNA).  Pharmacy has been consulted for vancomycin dosing. Renal function has improved with Scr back to baseline at 0.74 and estimated CrCl of 46.60ml/min. Pt WBC and PCT are also downtrending today: WBC 21.3 and PCT 14.22 and she remains afebrile.   Of note: Patient has documented non-severe penicillin allergy, reported history of rash and flushed from cephalexin, has tolerated cefepime and ceftriaxone in the past.  Her renal function has remained stable. The staph aureus sens are not back yet. We will wait on doing levels until it's back. Her CBGs are also elevated d/t steroids. Ok to add back the SSI and give one dose of Lokelma for the hyperkalemia per Dr. Barth Kirks.   Plan: Cont vancomycin dose to 1000mg  q36hrs. Estimated AUC 500.3 with Scr 0.8 used for calculation Monitor renal function, CBC, PCT, culture results, clinical progress F/u sens of staph aureus Sensitive SSI added back Lokelma 10g PO x1  Height: 5\' 3"  (160 cm) Weight: 43.1 kg (95 lb 0.3 oz) IBW/kg (Calculated) : 52.4  No data recorded.  Recent Labs  Lab 11/08/20 0516 11/09/20 0107 11/10/20 0844 11/11/20 0429 11/12/20 0227 11/12/20 0440 11/13/20 0457  WBC 31.5* 21.3* 17.6* 13.4* 14.2*  --  18.0*  CREATININE 0.80 0.74 0.60 0.55  --  0.44 0.56  LATICACIDVEN 0.7  --   --   --   --   --   --     Estimated Creatinine Clearance: 46.4 mL/min (by C-G formula based on SCr of 0.56 mg/dL).    Allergies  Allergen Reactions  . Penicillins Rash and Hives    Has patient had a PCN reaction causing immediate rash, facial/tongue/throat swelling, SOB or lightheadedness with hypotension: No Has patient had a PCN reaction causing severe rash involving mucus membranes or skin necrosis:NO Has patient had a PCN reaction that required hospitalization No Has patient had a PCN reaction  occurring within the last 10 years:NO If all of the above answers are "NO", then may proceed with Cephalosporin use.  Marland Kitchen Morphine And Related Other (See Comments)    Made the patient incoherent and she "talked out her head"  . Cephalexin Rash and Other (See Comments)    Flushed, also    Antimicrobials this admission: Levofloxacin 1/6 >1/10  1/11 cefepime >> Vanc 1/6> Flagyl 1/7 > 1/11   Dose adjustments this admission: Present dose adjustment: vancomycin 750mg  q36h to 1000mg  q36h  Microbiology results: 1/10 Resp cx:  Rare Staph aureus 1/6BCx: NGF 1/6MRSA PCR: positive 1/6 resp panel: neg   Onnie Boer, PharmD, BCIDP, AAHIVP, CPP Infectious Disease Pharmacist 11/14/2020 9:04 AM

## 2020-11-14 NOTE — Progress Notes (Signed)
[facility])  Referral received for hospice services at home.  Spoke with dtr [name] [phone]) confirmed interest and offered support.  DME in place already: O2 through adapt, and WC  DME needs: hospital bed, BSC, suction and nebulizer--[facility] will order this DME.  Per [name], she needs to go home to make room for DME. She indicates if the DME is delivered today that she can accept her mother into the home Saturday am.  If any comfort meds are anticipated, please arrange for 1-2 days worth at d/c so there is no delay in her comfort prior to hospice services starting.  Venia Carbon RN, BSN, [facility] Liaison

## 2020-11-14 NOTE — Progress Notes (Signed)
NAME:  Megan [name], MRN:  [id number], DOB:  04/18/1951, LOS: 2 ADMISSION DATE:  11/06/2020, CONSULTATION DATE:  11/08/2019 REFERRING MD:  Dr Manuella Ghazi CHIEF COMPLAINT: Abnormal CT scan  Brief History:  70 year old female presents with acute respiratory distress with history of multiple pulmonary bacterial and viral infections.  Also has history of esophageal dysmotility.  PCCM consulted for further management of questionable right lower lobe necrotizing pneumonia.  No acute distress. Events of 1/12 noted Was stuck on BiPAP yesterday 1/13 -Today she is on supplemental oxygen  History of Present Illness:  Megan [name] is a 70 year old female with a past medical history significant for Covid pneumonia August 2021, prior MI, GERD, hypertension, COPD on chronic supplemental oxygen at baseline, congestive heart failure, and anxiety who presented to the emergency department with shortness of breath.  Patient states shortness of breath had improved after having Covid pneumonia August 2021.  She states that her supplemental oxygen post discharge was 2.5 liters continuously.  Of note patient was seen with questionable cavitary lesions in the left upper lobe but during that hospitalization.  Patient reports 2 days prior to this admission shortness of breath had worsened with productive cough as well.  She also reports subjective fever with chills and night sweats that began 2 days prior to admission.  On EMS arrival patient was seen hypoxic requiring placement of CPAP.  On ED arrival patient was transitioned to BiPAP to continue with increased work of breathing.  Vital signs significant on admission for temperature 100.1, tachycardia with a rate of 120-140, tachycardia and mild hypotension.  24.4, chloride 88, glucose 147, creatinine 1.21, high-sensitivity troponin 36, BNP 341.7, WBC 29.8, hemoglobin 10.5 and lactic acidosis 2.0.  Patient was admitted under hospitalist service for sepsis secondary to pneumonia and PCCM  was consulted for further management  Past Medical History:  Admitted 11/07/2018.Covid pneumonia August 2021, prior MI, GERD, hypertension, COPD on chronic supplemental oxygen at baseline, congestive heart failure, and anxiety   Significant Hospital Events:  Admitted 11/07/2018. Significant decompensation 11/12/2020 -Made DNR  Consults:  Pulmonary  Procedures:  none  Significant Diagnostic Tests:  CT chest without contrast 11/07/2020 > Dense, near complete consolidation of the right lower lobe with cavitation in keeping with lobar, necrotizing pneumonia. Complete impaction of the right lower lobar airways. Moderate centrilobular emphysema with marked pulmonary hyperinflation. Multiple pulmonary nodules, likely inflammatory given their relatively rapid development since prior examination.  Micro Data:  COVID 11/06/2020 > negative Blood cultures 11/06/2020 >  Culture 11/06/2020 >  MRSA PCR 11/07/2020 > positive Antimicrobials:   Levaquin 11/06/2020 Vancomycin 11/06/2020 Flagyl 11/06/2020-1/11  Interim History / Subjective:  Events on 11/12/2020 noted Now awake and interactive Confused but very pleasant  Objective   Blood pressure 139/65, pulse (!) 103, temperature 97.8 F (36.6 C), temperature source Axillary, resp. rate (!) 25, height 5\' 3"  (1.6 m), weight 43.1 kg, SpO2 98 %.       No intake or output data in the 24 hours ending 11/14/20 1047 Filed Weights   11/06/20 1929  Weight: 43.1 kg    Examination: General: Frail, following commands Lungs: Poor air movement bilaterally Cardiovascular: S1-S2 appreciated Abdomen: Bowel sounds appreciated Extremities: No edema, no clubbing  Resolved Hospital Problem list     Assessment & Plan:  Transitioning to hospice care  Acute hypoxemic/hypercapnic respiratory failure Necrotizing pneumonia  Does not want to be on ventilator  BiPAP did help  She has severe/end-stage COPD History of dysmotility and swallowing problems  chronically  Continue  efforts to make her comfortable   Antibiotics can be stopped after 7 days of total cefepime/Vanco  Will sign off at present Please call as needed  Sherrilyn Rist, MD [place] [facility] Pager: [phone]

## 2020-11-14 NOTE — Telephone Encounter (Signed)
I spoke to Megan [name]. She stated that she was in touch with Dr. Lamonte Sakai and he agreed to be the attending.

## 2020-11-14 NOTE — Progress Notes (Signed)
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  Work-up revealed right-sided consolidation with pleural effusion and CT showed cavitary pneumonia. Patient was transitioned to a BiPAP and started on broad-spectrum antibiotics.  She was liberated from BiPAP on 1/7 but continued to require high level of oxygen.  The patient was admitted in August of last year with COVID pneumonia and she was discharged home with 2 L of oxygen.   Subjective: She is currently on oxygen by nasal cannula.  She is extremely confused.    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 - It was suspected at that time that she may suffer from chronic aspiration -She was also noted to be MRSA PCR positive on this admission - she received treatment with cefepime and Vancomycin - due to increasing respiratory distress on 1/12, added Solumedrol. Suspect her increasing distress is either due to mucous trapping in small airways or another aspiration event. The patient stated to the previous attending that she does not want to be intubated again. She had BiPAP at night. - appreciate PCCM assistance in transitioning her to DNR  Plan:  Patient is quite confused. We are moving towards comfort care for her. She was transitioned to DNR.  Daughter requested that she take her home with hospice. Continue BiPAP PRN. Patient can drink liquids for comfort. 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 -Continue to monitor blood pressure and adjust medications as needed.   Time spent in minutes: 25 DVT prophylaxis:  Code Status: DNR Family Communication: No family at the bedside at this time.  Daughter aware of the plan of care. Disposition Plan:  Status is: Inpatient  Remains inpatient appropriate because:Ongoing respiratory failure, pneumonia being treated with IV antibiotics   Dispo: The patient is from: Home              Anticipated d/c is to: Likely home with hospice.              Anticipated d/c date is: > 3 days              Patient currently is not medically stable to d/c.  Consultants:   Pulmonary critical care Procedures:   BiPAP Antimicrobials:  Anti-infectives (From admission, onward)   Start     Dose/Rate Route Frequency Ordered Stop   11/11/20 1100  ceFEPIme (  MAXIPIME) 2 g in sodium chloride 0.9 % 100 mL IVPB        2 g 200 mL/hr over 30 Minutes Intravenous Every 12 hours 11/11/20 1001     11/09/20 2130  vancomycin (VANCOCIN) IVPB 1000 mg/200 mL premix        1,000 mg 200 mL/hr over 60 Minutes Intravenous Every 36 hours 11/09/20 0905     11/08/20 2000  levofloxacin (LEVAQUIN) IVPB 750 mg        750 mg 100 mL/hr over 90  Minutes Intravenous Every 48 hours 11/07/20 0149 11/10/20 2305   11/08/20 0930  vancomycin (VANCOREADY) IVPB 750 mg/150 mL  Status:  Discontinued        750 mg 150 mL/hr over 60 Minutes Intravenous Every 36 hours 11/06/20 2130 11/09/20 0905   11/07/20 0800  metroNIDAZOLE (FLAGYL) IVPB 500 mg  Status:  Discontinued        500 mg 100 mL/hr over 60 Minutes Intravenous Every 8 hours 11/07/20 0744 11/11/20 1247   11/07/20 0145  levofloxacin (LEVAQUIN) IVPB 750 mg  Status:  Discontinued        750 mg 100 mL/hr over 90 Minutes Intravenous Every 24 hours 11/07/20 0136 11/07/20 0148   11/06/20 2045  vancomycin (VANCOCIN) IVPB 1000 mg/200 mL premix        1,000 mg 200 mL/hr over 60 Minutes Intravenous  Once 11/06/20 2033 11/06/20 2230   11/06/20 2030  levofloxacin (LEVAQUIN) IVPB 750 mg        750 mg 100 mL/hr over 90 Minutes Intravenous  Once 11/06/20 2016 11/06/20 2230       Objective: Vitals:   11/13/20 0800 11/13/20 1200 11/13/20 1800 11/14/20 0751  BP: (!) 131/54 (!) 141/59 139/65   Pulse: 96 (!) 116 (!) 106 (!) 103  Resp: 20 19 (!) 21 (!) 25  Temp: 97.8 F (36.6 C)     TempSrc: Axillary     SpO2: 100% 98% 93% 98%  Weight:      Height:       No intake or output data in the 24 hours ending 11/14/20 1124 Filed Weights   11/06/20 1929  Weight: 43.1 kg    Examination: General exam:  -frail-appearing female, very confused, appears comfortable at this time HEENT: PERRLA, oral mucosa dry, no sclera icterus or thrush Respiratory system:  rhonchi - Respiratory effort normal. Cardiovascular system: S1 & S2   Gastrointestinal system: Abdomen soft, non-tender, nondistended. Normal bowel sounds. Central nervous system: Confused, oriented x1 only. No focal neurological deficits. Extremities: No cyanosis, clubbing or edema Skin: No rashes or ulcers Psychiatry:  Mood & affect appropriate.     Data Reviewed: I have personally reviewed following labs and imaging studies  CBC: Recent  Labs  Lab 11/09/20 0107 11/10/20 0844 11/11/20 0429 11/12/20 0227 11/13/20 0457  WBC 21.3* 17.6* 13.4* 14.2* 18.0*  NEUTROABS  --   --   --  12.6*  --   HGB 9.2* 10.2* 8.7* 9.8* 9.6*  HCT 30.3* 34.3* 32.0* 33.3* 33.7*  MCV 97.1 100.9* 104.9* 103.1* 104.7*  PLT 498* 480* 399 322 244*   Basic Metabolic Panel: Recent Labs  Lab 11/08/20 0516 11/09/20 0107 11/10/20 0350 11/10/20 0844 11/11/20 0429 11/12/20 0440 11/13/20 0457 11/13/20 0843  NA 137 137  --  139 140 140 138  --   K 4.5 4.4  --  5.4* 5.1 4.8 6.6* 6.0*  CL 89* 88*  --  89* 90* 87* 87*  --   CO2  40* 39*  --  41* 42* 48* 39*  --   GLUCOSE 180* 198*  --  145* 142* 202* 215*  --   BUN 32* 40*  --  21 23 17  28*  --   CREATININE 0.80 0.74  --  0.60 0.55 0.44 0.56  --   CALCIUM 9.1 9.3  --  9.0 8.5* 8.7* 8.6*  --   MG 1.9 1.5* 2.0  --   --  1.8  --   --    GFR: Estimated Creatinine Clearance: 46.4 mL/min (by C-G formula based on SCr of 0.56 mg/dL). Liver Function Tests: No results for input(s): AST, ALT, ALKPHOS, BILITOT, PROT, ALBUMIN in the last 168 hours. No results for input(s): LIPASE, AMYLASE in the last 168 hours. No results for input(s): AMMONIA in the last 168 hours. Coagulation Profile: No results for input(s): INR, PROTIME in the last 168 hours. Cardiac Enzymes: No results for input(s): CKTOTAL, CKMB, CKMBINDEX, TROPONINI in the last 168 hours. BNP (last 3 results) No results for input(s): PROBNP in the last 8760 hours. HbA1C: No results for input(s): HGBA1C in the last 72 hours. CBG: Recent Labs  Lab 11/12/20 2212 11/13/20 0841 11/13/20 0842 11/13/20 1209 11/14/20 1027  GLUCAP 177* 192* 178* 272* 257*   Lipid Profile: No results for input(s): CHOL, HDL, LDLCALC, TRIG, CHOLHDL, LDLDIRECT in the last 72 hours. Thyroid Function Tests: No results for input(s): TSH, T4TOTAL, FREET4, T3FREE, THYROIDAB in the last 72 hours. Anemia Panel: No results for input(s): VITAMINB12, FOLATE, FERRITIN, TIBC,  IRON, RETICCTPCT in the last 72 hours. Urine analysis:    Component Value Date/Time   COLORURINE YELLOW 08/07/2020 1217   APPEARANCEUR CLEAR 08/07/2020 1217   LABSPEC 1.015 08/07/2020 1217   PHURINE 7.5 08/07/2020 1217   GLUCOSEU NEGATIVE 08/07/2020 1217   HGBUR NEGATIVE 08/07/2020 1217   BILIRUBINUR NEGATIVE 08/07/2020 1217   KETONESUR NEGATIVE 08/07/2020 1217   PROTEINUR 100 (A) 11/04/2018 1622   UROBILINOGEN 0.2 08/07/2020 1217   NITRITE NEGATIVE 08/07/2020 1217   LEUKOCYTESUR NEGATIVE 08/07/2020 1217   Sepsis Labs: @LABRCNTIP (procalcitonin:4,lacticidven:4) ) Recent Results (from the past 240 hour(s))  Resp Panel by RT-PCR (Flu A&B, Covid) Nasopharyngeal Swab     Status: None   Collection Time: 11/06/20  7:35 PM   Specimen: Nasopharyngeal Swab; Nasopharyngeal(NP) swabs in vial transport medium  Result Value Ref Range Status   SARS Coronavirus 2 by RT PCR NEGATIVE NEGATIVE Final    Comment: (NOTE) SARS-CoV-2 target nucleic acids are NOT DETECTED.  The SARS-CoV-2 RNA is generally detectable in upper respiratory specimens during the acute phase of infection. The lowest concentration of SARS-CoV-2 viral copies this assay can detect is 138 copies/mL. A negative result does not preclude SARS-Cov-2 infection and should not be used as the sole basis for treatment or other patient management decisions. A negative result may occur with  improper specimen collection/handling, submission of specimen other than nasopharyngeal swab, presence of viral mutation(s) within the areas targeted by this assay, and inadequate number of viral copies(<138 copies/mL). A negative result must be combined with clinical observations, patient history, and epidemiological information. The expected result is Negative.  Fact Sheet for Patients:  EntrepreneurPulse.com.au  Fact Sheet for Healthcare Providers:  IncredibleEmployment.be  This test is no t yet approved  or cleared by the [place] FDA and  has been authorized for detection and/or diagnosis of SARS-CoV-2 by FDA under an Emergency Use Authorization (EUA). This EUA will remain  in effect (meaning this test can be  used) for the duration of the COVID-19 declaration under Section 564(b)(1) of the Act, 21 U.S.C.section 360bbb-3(b)(1), unless the authorization is terminated  or revoked sooner.       Influenza A by PCR NEGATIVE NEGATIVE Final   Influenza B by PCR NEGATIVE NEGATIVE Final    Comment: (NOTE) The Xpert Xpress SARS-CoV-2/FLU/RSV plus assay is intended as an aid in the diagnosis of influenza from Nasopharyngeal swab specimens and should not be used as a sole basis for treatment. Nasal washings and aspirates are unacceptable for Xpert Xpress SARS-CoV-2/FLU/RSV testing.  Fact Sheet for Patients: EntrepreneurPulse.com.au  Fact Sheet for Healthcare Providers: IncredibleEmployment.be  This test is not yet approved or cleared by the [place] FDA and has been authorized for detection and/or diagnosis of SARS-CoV-2 by FDA under an Emergency Use Authorization (EUA). This EUA will remain in effect (meaning this test can be used) for the duration of the COVID-19 declaration under Section 564(b)(1) of the Act, 21 U.S.C. section 360bbb-3(b)(1), unless the authorization is terminated or revoked.  Performed at [facility], [place] [address] [place], [place] [address]   Culture, blood (routine x 2)     Status: None   Collection Time: 11/06/20  9:40 PM   Specimen: BLOOD  Result Value Ref Range Status   Specimen Description BLOOD SITE NOT SPECIFIED  Final   Special Requests   Final    BOTTLES DRAWN AEROBIC AND ANAEROBIC Blood Culture adequate volume   Culture   Final    NO GROWTH 5 DAYS Performed at [facility], [address], [place], [place] [address]    Report Status 11/11/2020 FINAL  Final  Culture, blood (routine x 2)      Status: None   Collection Time: 11/06/20 10:20 PM   Specimen: BLOOD LEFT HAND  Result Value Ref Range Status   Specimen Description BLOOD LEFT HAND [facility] VANC  Final   Special Requests   Final    BOTTLES DRAWN AEROBIC AND ANAEROBIC Blood Culture adequate volume   Culture   Final    NO GROWTH 5 DAYS Performed at [facility], [place] [address], [place], [place] [address]    Report Status 11/12/2020 FINAL  Final  MRSA PCR Screening     Status: Abnormal   Collection Time: 11/07/20  5:35 AM   Specimen: Nasopharyngeal  Result Value Ref Range Status   MRSA by PCR POSITIVE (A) NEGATIVE Final    Comment:        The GeneXpert MRSA Assay (FDA approved for NASAL specimens only), is one component of a comprehensive MRSA colonization surveillance program. It is not intended to diagnose MRSA infection nor to guide or monitor treatment for MRSA infections. RESULT CALLED TO, READ BACK BY AND VERIFIED WITH: Herschel Senegal RN 8:35 11/07/20 ([email or web address]) Performed at [facility], [place] [address] [place], [place] [address]   Expectorated sputum assessment w rflx to resp cult     Status: None   Collection Time: 11/10/20  8:42 AM   Specimen: Expectorated Sputum  Result Value Ref Range Status   Specimen Description EXPSU  Final   Special Requests NONE  Final   Sputum evaluation   Final    THIS SPECIMEN IS ACCEPTABLE FOR SPUTUM CULTURE Performed at [facility], [place] [address], [place], [place] [address]    Report Status 11/12/2020 FINAL  Final  Culture, respiratory     Status: None   Collection Time: 11/10/20  8:42 AM  Result Value Ref Range Status   Specimen Description EXPSU  Final   Special Requests NONE Reflexed from [id number]  Final   Gram Stain   Final    RARE WBC PRESENT, PREDOMINANTLY PMN NO ORGANISMS SEEN Performed at [facility], [address] [place], [place] [address]    Culture RARE STAPHYLOCOCCUS AUREUS  Final   Report Status 11/14/2020 FINAL  Final   Organism  ID, Bacteria STAPHYLOCOCCUS AUREUS  Final      Susceptibility   Staphylococcus aureus - MIC*    CIPROFLOXACIN >=8 RESISTANT Resistant     ERYTHROMYCIN >=8 RESISTANT Resistant     GENTAMICIN <=0.5 SENSITIVE Sensitive     OXACILLIN <=0.25 SENSITIVE Sensitive     TETRACYCLINE >=16 RESISTANT Resistant     VANCOMYCIN <=0.5 SENSITIVE Sensitive     TRIMETH/SULFA <=10 SENSITIVE Sensitive     CLINDAMYCIN >=8 RESISTANT Resistant     RIFAMPIN <=0.5 SENSITIVE Sensitive     Inducible Clindamycin NEGATIVE Sensitive     * RARE STAPHYLOCOCCUS AUREUS         Radiology Studies: DG CHEST PORT 1 VIEW  Result Date: 11/13/2020 CLINICAL DATA:  Respiratory failure EXAM: PORTABLE CHEST 1 VIEW COMPARISON:  November 06, 2020. FINDINGS: Portions of the lung bases not included. There are apparent nipple shadows bilaterally. There is ill-defined airspace opacity in both lower lung regions, present on most recent study on the right with questionable new opacity left base. Heart size and pulmonary vascularity normal. No adenopathy. There is aortic atherosclerosis. No bone lesions. IMPRESSION: Airspace opacity in the lung bases, incompletely visualized. Suspect new potential pleural effusion versus airspace opacity left base with evidence suggesting right pleural effusion with patchy airspace opacity right base. There may be partial clearing of infiltrate from the right base; this area is incompletely visualized making assessment difficult. Stable cardiac silhouette. Aortic Atherosclerosis (ICD10-I70.0). Electronically Signed   By: Lowella Grip III M.D.   On: 11/13/2020 09:21      Scheduled Meds: . umeclidinium bromide  1 puff Inhalation Daily   And  . fluticasone furoate-vilanterol  1 puff Inhalation Daily  . insulin aspart  0-5 Units Subcutaneous QHS  . insulin aspart  0-9 Units Subcutaneous TID WC  . lidocaine  2 patch Transdermal Q24H  . methylPREDNISolone (SOLU-MEDROL) injection  40 mg Intravenous Q8H    Continuous Infusions: . ceFEPime (MAXIPIME) IV 2 g (11/14/20 1045)  . HYDROmorphone    . vancomycin 1,000 mg (11/14/20 0840)     LOS: 8 days      Yaakov Guthrie, MD [facility] Pager:on [email or web address] [email or web address] 11/14/2020, 11:24 AM

## 2020-11-14 NOTE — Telephone Encounter (Signed)
Crystal with [facility] calling back to follow up and see if RB is willing to be pt's attending for hospice care. RB please advise, thanks.

## 2020-11-14 NOTE — Telephone Encounter (Signed)
Albina Billet from [facility] calling, they received a referral for this patient and it said if they couldn't get an answer from Dr. Lamonte Sakai to see if Dr. Jenny Reichmann would be the attending for this patient. Dr. Lamonte Sakai is working at the hospital today so they said it would be likely he wouldn't respond until next week. Albina Billet trying to get the process began before the weekend for the patient.  [phone]

## 2020-11-14 NOTE — Progress Notes (Signed)
Daily Progress Note   Patient Name: Megan [name]       Date: 11/14/2020 DOB: 1950-12-31  Age: 70 y.o. MRN#: [id number] Attending Physician: Yaakov Guthrie, MD Primary Care Physician: [name], MD Admit Date: 11/06/2020  Reason for Follow-up: terminal care, symptom management  Subjective: Patient has remained comfortable and stable overnight on oxygen at 2L.   This morning I spoke with [facility] hospice liaison this morning, and plan was for equipment to be delivered today and discharge home tomorrow.   14:25--Notified by [facility] hospice liaison that patient has been calling her daughter multiple times today asking to come home. Per liaison, daughter is agreeable to get her home prior to equipment being delivered.   I have ordered comfort medications and e-scribed them to [facility] pharmacy to be sent with patient on discharge.   I spoke with daughter by phone to confirm plan for discharge today. Daughter verbalizes understanding that her mother is nearing end of life and wants her home as soon as possible to honor her wishes.   Length of Stay: 8  Current Medications: Scheduled Meds:  . umeclidinium bromide  1 puff Inhalation Daily   And  . fluticasone furoate-vilanterol  1 puff Inhalation Daily  . insulin aspart  0-5 Units Subcutaneous QHS  . insulin aspart  0-9 Units Subcutaneous TID WC  . lidocaine  2 patch Transdermal Q24H  . methylPREDNISolone (SOLU-MEDROL) injection  40 mg Intravenous Q8H       PRN Meds: acetaminophen **OR** acetaminophen, antiseptic oral rinse, butalbital-acetaminophen-caffeine, glycopyrrolate **OR** glycopyrrolate **OR** glycopyrrolate, HYDROmorphone, levalbuterol, LORazepam, ondansetron (ZOFRAN) IV, polyvinyl alcohol  Physical Exam Vitals reviewed.   Constitutional:      General: She is not in acute distress.    Appearance: She is cachectic. She is ill-appearing.  Pulmonary:     Effort: Pulmonary effort is normal.  Neurological:     Mental Status: She is alert. She is confused.             Vital Signs: BP (!) 131/59 (BP Location: Right Arm)   Pulse (!) 109   Temp 98.9 F (37.2 C) (Oral)   Resp (!) 23   Ht 5\' 3"  (1.6 m)   Wt 43.1 kg   SpO2 99%   BMI 16.83 kg/m  SpO2: SpO2: 99 % O2 Device: O2 Device:  High Flow Nasal Cannula O2 Flow Rate: O2 Flow Rate (L/min): 3 L/min  Intake/output summary: No intake or output data in the 24 hours ending 11/14/20 1656 LBM: Last BM Date: 11/08/20 Baseline Weight: Weight: 43.1 kg Most recent weight: Weight: 43.1 kg       Palliative Assessment/Data: PPS 20%      Palliative Care Assessment & Plan   HPI/Patient Profile: 70 y.o. female  with past medical history of end-stage COPD on trilogy at home, hypertension, and COVID pneumonia who presented to the emergency department on 11/06/2020 with worsening shortness of breath. CT chest showed dense near complete consolidation of the right lower lobe with cavitation suggestion lobar, necrotizing pneumonia.  On 1/12, she developed acute respiratory distress with desaturation to the 50's. Patient declined intubation and code status was changed to DNR.   Assessment: - cavitary pneumonia/sepsis (in the setting of suspected chronic aspiration) - acute on chronic hypoxemic and hypercapnic respiratory failure - end stage COPD - pulmonary HTN - terminal care  Recommendations/Plan:  Continue full comfort care  DNR/DNI as previously documented [facility] Rm DNR form signed and on chart)  Plan for discharge home with hospice  I have ordered comfort meds and sent to [facility] - Dilaudid 1 mg tablet every 4 hours as needed for pain or dyspnea (#30) - Ativan 1 mg tablet every 4 hours as needed for anxiety (#30)  Goals of Care and Additional  Recommendations:  Limitations on Scope of Treatment: Full Comfort Care  Code Status: DNR/DNI  Prognosis:  Days to weeks  Discharge Planning:  Home with Hospice  Care plan was discussed with Hospitalist Dr. Barth Kirks, CSW, nursing, hospice liaison  Thank you for allowing the Palliative Medicine Team to assist in the care of this patient.   Total Time 35 minutes Prolonged Time Billed  no       Greater than 50%  of this time was spent counseling and coordinating care related to the above assessment and plan.  Lavena Bullion, NP  Please contact Palliative Medicine Team phone at [phone] for questions and concerns.

## 2020-11-14 NOTE — Discharge Summary (Signed)
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 Work-up revealed right-sided consolidation with pleural effusion and CT showed cavitary pneumonia. Patient was transitioned to a BiPAP and started on broad-spectrum antibiotics.  She was liberated from BiPAP on 1/7 but continued to require high level of oxygen.  The patient was admitted in August of last year with COVID pneumonia and at that time she was discharged home with 2 L of oxygen.    Following is the hospital course in the problem list format:  Cavitary pneumonia/  Sepsis-suspected chronic aspiration Acute on chronic hypoxemic and hypercapnic respiratory failure Dysphagia secondary to esophageal dysmotility - When the patient was last admitted in August, she had a 2.8 x 2.8 cm cavitation of the left upper lobe. - It was suspected at that time that she may suffer from chronic aspiration -She was also noted to be MRSA PCR positive on this admission - she received treatment with cefepime and Vancomycin - due to increasing respiratory distress on 1/12, given Solumedrol. Suspect her increasing distress is either due to mucous trapping in small airways or another aspiration event. The patient stated to the previous attending that she does not want to be intubated again.  -PCCM was following. -Palliative care consulted.  Appreciate help by palliative care.  After discussion with the family patient made comfort care.  Daughter wanting to take the patient home.  She still remains very  confused.  Plan:  Patient is quite confused. She was transitioned to DNR.  Daughter requested that she take her home with hospice.   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  Pulmonary hypertension - Noted on 2D echo on 11/08/2020-likely secondary to underlying emphysema  Hypertension/sinus tachycardia -Patient transitioning to comfort care as needed hydrochlorothiazide, Cardizem was discontinued.    Discharge Diagnoses:  Principal Problem:   CAP (community acquired pneumonia) Active Problems:   COPD (chronic obstructive pulmonary disease) ([place])   Hyperglycemia   Sepsis ([place])   Dysphagia   Acute respiratory failure with hypoxia [facility])    Discharge Instructions Patient transitioned to comfort care.  Daughter wanting to take the patient home with hospice.  Allergies as of 11/14/2020      Reactions   Penicillins Rash, Hives   Has patient had a PCN reaction causing immediate rash, facial/tongue/throat swelling, SOB or lightheadedness with hypotension: No Has patient had a PCN reaction causing severe rash involving mucus membranes or skin necrosis:NO Has patient had a PCN reaction that required hospitalization No Has patient had a PCN reaction occurring within the last 10 years:NO If all of the above answers are "NO", then may proceed with Cephalosporin use.   Morphine And Related Other (See Comments)   Made the patient incoherent and  she "talked out her head"   Cephalexin Rash, Other (See Comments)   Flushed, also      Medication List    STOP taking  these medications   albuterol 108 (90 Base) MCG/ACT inhaler Commonly known as: Ventolin HFA   ALPRAZolam 0.25 MG tablet Commonly known as: XANAX   aspirin EC 81 MG tablet   azelastine 0.1 % nasal spray Commonly known as: ASTELIN   Butalbital-APAP-Caffeine 50-325-40 MG capsule   diltiazem 120 MG 24 hr capsule Commonly known as: CARDIZEM CD   feeding supplement Liqd   fluticasone 50 MCG/ACT nasal spray Commonly known as: FLONASE   hydrochlorothiazide 12.5 MG capsule Commonly known as: Microzide   ipratropium-albuterol 0.5-2.5 (3) MG/3ML Soln Commonly known as: DUONEB   lidocaine 5 % Commonly known as: LIDODERM   loratadine 10 MG tablet Commonly known as: CLARITIN   Mucinex 600 MG 12 hr tablet Generic drug: guaiFENesin   ondansetron 4 MG tablet Commonly known as: Zofran   pantoprazole 40 MG tablet Commonly known as: PROTONIX   polyethylene glycol 17 g packet Commonly known as: MIRALAX / GLYCOLAX   predniSONE 20 MG tablet Commonly known as: DELTASONE   senna-docusate 8.6-50 MG tablet Commonly known as: Senokot-S   Tab-A-Vite/Beta Carotene Tabs   Trelegy Ellipta 100-62.5-25 MCG/INH Aepb Generic drug: Fluticasone-Umeclidin-Vilant     TAKE these medications   glycopyrrolate 1 MG tablet Commonly known as: ROBINUL Take 1 tablet (1 mg total) by mouth every 4 (four) hours as needed (excessive secretions).   HYDROmorphone 2 MG tablet Commonly known as: DILAUDID Take 0.5 tablets (1 mg total) by mouth every 4 (four) hours as needed for severe pain (or shortness of breath).   LORazepam 1 MG tablet Commonly known as: ATIVAN Take 1 tablet (1 mg total) by mouth every 4 (four) hours as needed for anxiety.       Allergies  Allergen Reactions  . Penicillins Rash and Hives    Has patient had a PCN reaction causing immediate rash, facial/tongue/throat swelling, SOB or lightheadedness with hypotension: No Has patient had a PCN reaction causing severe rash  involving mucus membranes or skin necrosis:NO Has patient had a PCN reaction that required hospitalization No Has patient had a PCN reaction occurring within the last 10 years:NO If all of the above answers are "NO", then may proceed with Cephalosporin use.  Marland Kitchen Morphine And Related Other (See Comments)    Made the patient incoherent and she "talked out her head"  . Cephalexin Rash and Other (See Comments)    Flushed, also    Consultations:  PCCM, palliative care   Procedures/Studies: CT CHEST WO CONTRAST  Result Date: 11/07/2020 CLINICAL DATA:  Pneumonia EXAM: CT CHEST WITHOUT CONTRAST TECHNIQUE: Multidetector CT imaging of the chest was performed following the standard protocol without IV contrast. COMPARISON:  06/11/2020 FINDINGS: Cardiovascular: No significant coronary artery calcification. Global cardiac size within normal limits. No pericardial effusion. The central pulmonary arteries are enlarged in keeping with changes of pulmonary arterial hypertension. Mild atherosclerotic calcification within the thoracic aorta. No aortic aneurysm. Mediastinum/Nodes: Thyroid unremarkable. No pathologic thoracic adenopathy. Esophagus is unremarkable. Lungs/Pleura: Moderate centrilobular emphysema with marked pulmonary hyperinflation. Mild biapical scarring. There is dense consolidation of the right lower lobe with scattered areas of cavitation in keeping with lobar, necrotizing pneumonia. The right lower lobe pulmonary bronchi are impacted and occluded proximally. No pneumothorax or pleural effusion. Scattered airway impaction is seen within the left lower lobe. There is associated bronchial wall thickening in keeping  with airway inflammation. 8 mm nodule within the right middle lobe, axial image # 97, and 6 mm subpleural nodule within the left lower lobe at axial image # 83 are new since prior examination and may be inflammatory in nature. Upper Abdomen: No acute abnormality Musculoskeletal: No acute bone  abnormality peer IMPRESSION: Dense, near complete consolidation of the right lower lobe with cavitation in keeping with lobar, necrotizing pneumonia. Complete impaction of the right lower lobar airways. Moderate centrilobular emphysema with marked pulmonary hyperinflation. Morphologic changes of pulmonary arterial hypertension. Multiple pulmonary nodules, likely inflammatory given their relatively rapid development since prior examination. Aortic Atherosclerosis (ICD10-I70.0). Electronically Signed   By: Fidela Salisbury MD   On: 11/07/2020 02:19   DG CHEST PORT 1 VIEW  Result Date: 11/13/2020 CLINICAL DATA:  Respiratory failure EXAM: PORTABLE CHEST 1 VIEW COMPARISON:  November 06, 2020. FINDINGS: Portions of the lung bases not included. There are apparent nipple shadows bilaterally. There is ill-defined airspace opacity in both lower lung regions, present on most recent study on the right with questionable new opacity left base. Heart size and pulmonary vascularity normal. No adenopathy. There is aortic atherosclerosis. No bone lesions. IMPRESSION: Airspace opacity in the lung bases, incompletely visualized. Suspect new potential pleural effusion versus airspace opacity left base with evidence suggesting right pleural effusion with patchy airspace opacity right base. There may be partial clearing of infiltrate from the right base; this area is incompletely visualized making assessment difficult. Stable cardiac silhouette. Aortic Atherosclerosis (ICD10-I70.0). Electronically Signed   By: Lowella Grip III M.D.   On: 11/13/2020 09:21   DG Chest Port 1 View  Result Date: 11/06/2020 CLINICAL DATA:  Short of breath, hypoxia, respiratory distress EXAM: PORTABLE CHEST 1 VIEW COMPARISON:  08/07/2020 FINDINGS: 2 frontal views of the chest demonstrate dense right basilar consolidation with small right pleural effusion. No pneumothorax. Background emphysema again noted. Cardiac silhouette is stable. No acute bony  abnormalities. IMPRESSION: 1. Dense right lower lobe pneumonia with small right parapneumonic effusion. Electronically Signed   By: Randa Ngo M.D.   On: 11/06/2020 20:10   ECHOCARDIOGRAM COMPLETE  Result Date: 11/09/2020    ECHOCARDIOGRAM REPORT   Patient Name:   Megan [name] Date of Exam: 11/08/2020 Medical Rec #:  [id number]    Height:       63.0 in Accession #:    [id number]   Weight:       95.0 lb Date of Birth:  05/08/1951     BSA:          1.409 m Patient Age:    73 years     BP:           123/58 mmHg Patient Gender: F            HR:           105 bpm. Exam Location:  Inpatient Procedure: 2D Echo, Cardiac Doppler and Color Doppler Indications:    Dyspnea  History:        Patient has prior history of Echocardiogram examinations, most                 recent 02/23/2018. CHF, Previous Myocardial Infarction, COPD,                 Signs/Symptoms:Shortness of Breath; Risk Factors:Former Smoker.                 Orthopnea.  Sonographer:    Clayton Lefort RDCS (AE) Referring Phys: [place]  Sonographer Comments: Patient sitting at 40 degrees in bed due to orthopnea for test. IMPRESSIONS  1. Left ventricular ejection fraction, by estimation, is 60 to 65%. The left ventricle has normal function. The left ventricle has no regional wall motion abnormalities. There is mild concentric left ventricular hypertrophy. Left ventricular diastolic parameters are consistent with Grade I diastolic dysfunction (impaired relaxation).  2. Right ventricular systolic function is normal. The right ventricular size is normal. There is moderately elevated pulmonary artery systolic pressure.  3. The mitral valve is normal in structure. Trivial mitral valve regurgitation. No evidence of mitral stenosis.  4. The aortic valve is normal in structure. Aortic valve regurgitation is not visualized. No aortic stenosis is present.  5. The inferior vena cava is normal in size with greater than 50% respiratory variability, suggesting right  atrial pressure of 3 mmHg. FINDINGS  Left Ventricle: Left ventricular ejection fraction, by estimation, is 60 to 65%. The left ventricle has normal function. The left ventricle has no regional wall motion abnormalities. The left ventricular internal cavity size was normal in size. There is  mild concentric left ventricular hypertrophy. Left ventricular diastolic parameters are consistent with Grade I diastolic dysfunction (impaired relaxation). Indeterminate filling pressures. Right Ventricle: The right ventricular size is normal. No increase in right ventricular wall thickness. Right ventricular systolic function is normal. There is moderately elevated pulmonary artery systolic pressure. The tricuspid regurgitant velocity is 3.31 m/s, and with an assumed right atrial pressure of 3 mmHg, the estimated right ventricular systolic pressure is 82.4 mmHg. Left Atrium: Left atrial size was normal in size. Right Atrium: Right atrial size was normal in size. Pericardium: There is no evidence of pericardial effusion. Mitral Valve: The mitral valve is normal in structure. Trivial mitral valve regurgitation. No evidence of mitral valve stenosis. MV peak gradient, 7.3 mmHg. The mean mitral valve gradient is 3.0 mmHg. Tricuspid Valve: The tricuspid valve is normal in structure. Tricuspid valve regurgitation is trivial. No evidence of tricuspid stenosis. Aortic Valve: The aortic valve is normal in structure. Aortic valve regurgitation is not visualized. No aortic stenosis is present. Aortic valve mean gradient measures 4.0 mmHg. Aortic valve peak gradient measures 7.7 mmHg. Aortic valve area, by VTI measures 2.10 cm. Pulmonic Valve: The pulmonic valve was normal in structure. Pulmonic valve regurgitation is not visualized. No evidence of pulmonic stenosis. Aorta: The aortic root is normal in size and structure. Venous: The inferior vena cava is normal in size with greater than 50% respiratory variability, suggesting right atrial  pressure of 3 mmHg. IAS/Shunts: No atrial level shunt detected by color flow Doppler.  LEFT VENTRICLE PLAX 2D LVIDd:         3.80 cm  Diastology LVIDs:         2.70 cm  LV e' medial:    6.64 cm/s LV PW:         1.20 cm  LV E/e' medial:  9.8 LV IVS:        1.30 cm  LV e' lateral:   8.16 cm/s LVOT diam:     1.80 cm  LV E/e' lateral: 8.0 LV SV:         51 LV SV Index:   36 LVOT Area:     2.54 cm  RIGHT VENTRICLE             IVC RV Basal diam:  2.60 cm     IVC diam: 0.70 cm RV S prime:     16.00 cm/s TAPSE (M-mode):  2.3 cm LEFT ATRIUM             Index       RIGHT ATRIUM          Index LA diam:        2.80 cm 1.99 cm/m  RA Area:     9.34 cm LA Vol (A2C):   22.2 ml 15.75 ml/m RA Volume:   17.50 ml 12.42 ml/m LA Vol (A4C):   19.6 ml 13.91 ml/m LA Biplane Vol: 21.4 ml 15.18 ml/m  AORTIC VALVE AV Area (Vmax):    2.20 cm AV Area (Vmean):   2.21 cm AV Area (VTI):     2.10 cm AV Vmax:           139.00 cm/s AV Vmean:          90.100 cm/s AV VTI:            0.242 m AV Peak Grad:      7.7 mmHg AV Mean Grad:      4.0 mmHg LVOT Vmax:         120.00 cm/s LVOT Vmean:        78.300 cm/s LVOT VTI:          0.200 m LVOT/AV VTI ratio: 0.83  AORTA Ao Root diam: 2.90 cm Ao Asc diam:  2.90 cm MITRAL VALVE               TRICUSPID VALVE MV Area (PHT): 5.23 cm    TR Peak grad:   43.8 mmHg MV Peak grad:  7.3 mmHg    TR Vmax:        331.00 cm/s MV Mean grad:  3.0 mmHg MV Vmax:       1.35 m/s    SHUNTS MV Vmean:      72.6 cm/s   Systemic VTI:  0.20 m MV Decel Time: 145 msec    Systemic Diam: 1.80 cm MV E velocity: 64.90 cm/s MV A velocity: 92.70 cm/s MV E/A ratio:  0.70 Skeet Latch MD Electronically signed by Skeet Latch MD Signature Date/Time: 11/09/2020/1:43:58 PM    Final    (Echo, Carotid, EGD, Colonoscopy, ERCP)    Subjective:   Discharge Exam: Vitals:   11/14/20 1500 11/14/20 1553  BP:  (!) 131/59  Pulse: (!) 112 (!) 109  Resp: (!) 27 (!) 23  Temp:  98.9 F (37.2 C)  SpO2: 99% 99%   Vitals:   11/14/20  0751 11/14/20 1400 11/14/20 1500 11/14/20 1553  BP:  (!) 175/84  (!) 131/59  Pulse: (!) 103 (!) 115 (!) 112 (!) 109  Resp: (!) 25 (!) 22 (!) 27 (!) 23  Temp:  98.8 F (37.1 C)  98.9 F (37.2 C)  TempSrc:  Oral  Oral  SpO2: 98% 96% 99% 99%  Weight:      Height:        General: Pt is alert, awake, not in acute distress Cardiovascular: RRR, S1/S2 +, no rubs, no gallops Respiratory: CTA bilaterally, no wheezing, no rhonchi Abdominal: Soft, NT, ND, bowel sounds + Extremities: no edema, no cyanosis    The results of significant diagnostics from this hospitalization (including imaging, microbiology, ancillary and laboratory) are listed below for reference.     Microbiology: Recent Results (from the past 240 hour(s))  Resp Panel by RT-PCR (Flu A&B, Covid) Nasopharyngeal Swab     Status: None   Collection Time: 11/06/20  7:35 PM   Specimen: Nasopharyngeal Swab; Nasopharyngeal(NP) swabs in vial  transport medium  Result Value Ref Range Status   SARS Coronavirus 2 by RT PCR NEGATIVE NEGATIVE Final    Comment: (NOTE) SARS-CoV-2 target nucleic acids are NOT DETECTED.  The SARS-CoV-2 RNA is generally detectable in upper respiratory specimens during the acute phase of infection. The lowest concentration of SARS-CoV-2 viral copies this assay can detect is 138 copies/mL. A negative result does not preclude SARS-Cov-2 infection and should not be used as the sole basis for treatment or other patient management decisions. A negative result may occur with  improper specimen collection/handling, submission of specimen other than nasopharyngeal swab, presence of viral mutation(s) within the areas targeted by this assay, and inadequate number of viral copies(<138 copies/mL). A negative result must be combined with clinical observations, patient history, and epidemiological information. The expected result is Negative.  Fact Sheet for Patients:  EntrepreneurPulse.com.au  Fact  Sheet for Healthcare Providers:  IncredibleEmployment.be  This test is no t yet approved or cleared by the [place] FDA and  has been authorized for detection and/or diagnosis of SARS-CoV-2 by FDA under an Emergency Use Authorization (EUA). This EUA will remain  in effect (meaning this test can be used) for the duration of the COVID-19 declaration under Section 564(b)(1) of the Act, 21 U.S.C.section 360bbb-3(b)(1), unless the authorization is terminated  or revoked sooner.       Influenza A by PCR NEGATIVE NEGATIVE Final   Influenza B by PCR NEGATIVE NEGATIVE Final    Comment: (NOTE) The Xpert Xpress SARS-CoV-2/FLU/RSV plus assay is intended as an aid in the diagnosis of influenza from Nasopharyngeal swab specimens and should not be used as a sole basis for treatment. Nasal washings and aspirates are unacceptable for Xpert Xpress SARS-CoV-2/FLU/RSV testing.  Fact Sheet for Patients: EntrepreneurPulse.com.au  Fact Sheet for Healthcare Providers: IncredibleEmployment.be  This test is not yet approved or cleared by the [place] FDA and has been authorized for detection and/or diagnosis of SARS-CoV-2 by FDA under an Emergency Use Authorization (EUA). This EUA will remain in effect (meaning this test can be used) for the duration of the COVID-19 declaration under Section 564(b)(1) of the Act, 21 U.S.C. section 360bbb-3(b)(1), unless the authorization is terminated or revoked.  Performed at [facility], [place] [address], [place], [place] [address]   Culture, blood (routine x 2)     Status: None   Collection Time: 11/06/20  9:40 PM   Specimen: BLOOD  Result Value Ref Range Status   Specimen Description BLOOD SITE NOT SPECIFIED  Final   Special Requests   Final    BOTTLES DRAWN AEROBIC AND ANAEROBIC Blood Culture adequate volume   Culture   Final    NO GROWTH 5 DAYS Performed at [facility], [address], [place], [place] [address]    Report Status 11/11/2020 FINAL  Final  Culture, blood (routine x 2)     Status: None   Collection Time: 11/06/20 10:20 PM   Specimen: BLOOD LEFT HAND  Result Value Ref Range Status   Specimen Description BLOOD LEFT HAND [facility] VANC  Final   Special Requests   Final    BOTTLES DRAWN AEROBIC AND ANAEROBIC Blood Culture adequate volume   Culture   Final    NO GROWTH 5 DAYS Performed at [facility], [place] [address], [place], [place] [address]    Report Status 11/12/2020 FINAL  Final  MRSA PCR Screening     Status: Abnormal   Collection Time: 11/07/20  5:35 AM   Specimen: Nasopharyngeal  Result Value Ref Range Status   MRSA by PCR POSITIVE (A) NEGATIVE Final    Comment:        The GeneXpert MRSA Assay (FDA approved for NASAL specimens only), is one component of a comprehensive MRSA colonization surveillance program. It is not intended to diagnose MRSA infection nor to guide or monitor treatment for MRSA infections. RESULT CALLED TO, READ BACK BY AND VERIFIED WITH: Herschel Senegal RN 8:35 11/07/20 ([email or web address]) Performed at [facility], [place] [address], [place], [place] [address]   Expectorated sputum assessment w rflx to resp cult     Status: None   Collection Time: 11/10/20  8:42 AM   Specimen: Expectorated Sputum  Result Value Ref Range Status   Specimen Description EXPSU  Final   Special Requests NONE  Final   Sputum evaluation   Final    THIS SPECIMEN IS ACCEPTABLE FOR SPUTUM CULTURE Performed at [facility], [place] [address] [place], [place] [address]    Report Status 11/12/2020 FINAL  Final  Culture, respiratory     Status: None   Collection Time: 11/10/20  8:42 AM  Result Value Ref Range Status   Specimen Description EXPSU  Final   Special Requests NONE Reflexed from [id number]  Final   Gram Stain   Final    RARE WBC PRESENT, PREDOMINANTLY PMN NO ORGANISMS SEEN Performed at [facility], [place] [address]  [place], [place] [address]    Culture RARE STAPHYLOCOCCUS AUREUS  Final   Report Status 11/14/2020 FINAL  Final   Organism ID, Bacteria STAPHYLOCOCCUS AUREUS  Final      Susceptibility   Staphylococcus aureus - MIC*    CIPROFLOXACIN >=8 RESISTANT Resistant     ERYTHROMYCIN >=8 RESISTANT Resistant     GENTAMICIN <=0.5 SENSITIVE Sensitive     OXACILLIN <=0.25 SENSITIVE Sensitive     TETRACYCLINE >=16 RESISTANT Resistant     VANCOMYCIN <=0.5 SENSITIVE Sensitive     TRIMETH/SULFA <=10 SENSITIVE Sensitive     CLINDAMYCIN >=8 RESISTANT Resistant     RIFAMPIN <=0.5 SENSITIVE Sensitive     Inducible Clindamycin NEGATIVE Sensitive     * RARE STAPHYLOCOCCUS AUREUS     Labs: BNP (last 3 results) Recent Labs    06/11/20 0127 11/06/20 1933  BNP 58.7 702.6*   Basic Metabolic Panel: Recent Labs  Lab 11/08/20 0516 11/09/20 0107 11/10/20 0350 11/10/20 0844 11/11/20 0429 11/12/20 0440 11/13/20 0457 11/13/20 0843  NA 137 137  --  139 140 140 138  --   K 4.5 4.4  --  5.4* 5.1 4.8 6.6* 6.0*  CL 89* 88*  --  89* 90* 87* 87*  --   CO2 40* 39*  --  41* 42* 48* 39*  --   GLUCOSE 180* 198*  --  145* 142* 202* 215*  --   BUN 32* 40*  --  21 23 17  28*  --   CREATININE 0.80 0.74  --  0.60 0.55 0.44 0.56  --   CALCIUM 9.1 9.3  --  9.0 8.5* 8.7* 8.6*  --   MG 1.9 1.5* 2.0  --   --  1.8  --   --    Liver Function Tests: No results for input(s): AST, ALT, ALKPHOS, BILITOT, PROT, ALBUMIN in the last 168 hours. No results for input(s): LIPASE, AMYLASE in the last 168 hours. No results for input(s): AMMONIA in the last 168 hours. CBC: Recent  Labs  Lab 11/09/20 0107 11/10/20 0844 11/11/20 0429 11/12/20 0227 11/13/20 0457  WBC 21.3* 17.6* 13.4* 14.2* 18.0*  NEUTROABS  --   --   --  12.6*  --   HGB 9.2* 10.2* 8.7* 9.8* 9.6*  HCT 30.3* 34.3* 32.0* 33.3* 33.7*  MCV 97.1 100.9* 104.9* 103.1* 104.7*  PLT 498* 480* 399 322 458*   Cardiac Enzymes: No results for input(s): CKTOTAL, CKMB, CKMBINDEX,  TROPONINI in the last 168 hours. BNP: Invalid input(s): POCBNP CBG: Recent Labs  Lab 11/13/20 0841 11/13/20 0842 11/13/20 1209 11/14/20 1027 11/14/20 1555  GLUCAP 192* 178* 272* 257* 193*   D-Dimer No results for input(s): DDIMER in the last 72 hours. Hgb A1c No results for input(s): HGBA1C in the last 72 hours. Lipid Profile No results for input(s): CHOL, HDL, LDLCALC, TRIG, CHOLHDL, LDLDIRECT in the last 72 hours. Thyroid function studies No results for input(s): TSH, T4TOTAL, T3FREE, THYROIDAB in the last 72 hours.  Invalid input(s): FREET3 Anemia work up No results for input(s): VITAMINB12, FOLATE, FERRITIN, TIBC, IRON, RETICCTPCT in the last 72 hours. Urinalysis    Component Value Date/Time   COLORURINE YELLOW 08/07/2020 1217   APPEARANCEUR CLEAR 08/07/2020 1217   LABSPEC 1.015 08/07/2020 1217   PHURINE 7.5 08/07/2020 1217   GLUCOSEU NEGATIVE 08/07/2020 1217   HGBUR NEGATIVE 08/07/2020 1217   BILIRUBINUR NEGATIVE 08/07/2020 1217   KETONESUR NEGATIVE 08/07/2020 1217   PROTEINUR 100 (A) 11/04/2018 1622   UROBILINOGEN 0.2 08/07/2020 1217   NITRITE NEGATIVE 08/07/2020 1217   LEUKOCYTESUR NEGATIVE 08/07/2020 1217   Sepsis Labs Invalid input(s): PROCALCITONIN,  WBC,  LACTICIDVEN Microbiology Recent Results (from the past 240 hour(s))  Resp Panel by RT-PCR (Flu A&B, Covid) Nasopharyngeal Swab     Status: None   Collection Time: 11/06/20  7:35 PM   Specimen: Nasopharyngeal Swab; Nasopharyngeal(NP) swabs in vial transport medium  Result Value Ref Range Status   SARS Coronavirus 2 by RT PCR NEGATIVE NEGATIVE Final    Comment: (NOTE) SARS-CoV-2 target nucleic acids are NOT DETECTED.  The SARS-CoV-2 RNA is generally detectable in upper respiratory specimens during the acute phase of infection. The lowest concentration of SARS-CoV-2 viral copies this assay can detect is 138 copies/mL. A negative result does not preclude SARS-Cov-2 infection and should not be used as  the sole basis for treatment or other patient management decisions. A negative result may occur with  improper specimen collection/handling, submission of specimen other than nasopharyngeal swab, presence of viral mutation(s) within the areas targeted by this assay, and inadequate number of viral copies(<138 copies/mL). A negative result must be combined with clinical observations, patient history, and epidemiological information. The expected result is Negative.  Fact Sheet for Patients:  EntrepreneurPulse.com.au  Fact Sheet for Healthcare Providers:  IncredibleEmployment.be  This test is no t yet approved or cleared by the [place] FDA and  has been authorized for detection and/or diagnosis of SARS-CoV-2 by FDA under an Emergency Use Authorization (EUA). This EUA will remain  in effect (meaning this test can be used) for the duration of the COVID-19 declaration under Section 564(b)(1) of the Act, 21 U.S.C.section 360bbb-3(b)(1), unless the authorization is terminated  or revoked sooner.       Influenza A by PCR NEGATIVE NEGATIVE Final   Influenza B by PCR NEGATIVE NEGATIVE Final    Comment: (NOTE) The Xpert Xpress SARS-CoV-2/FLU/RSV plus assay is intended as an aid in the diagnosis of influenza from Nasopharyngeal swab specimens and should not be used as  a sole basis for treatment. Nasal washings and aspirates are unacceptable for Xpert Xpress SARS-CoV-2/FLU/RSV testing.  Fact Sheet for Patients: EntrepreneurPulse.com.au  Fact Sheet for Healthcare Providers: IncredibleEmployment.be  This test is not yet approved or cleared by the [place] FDA and has been authorized for detection and/or diagnosis of SARS-CoV-2 by FDA under an Emergency Use Authorization (EUA). This EUA will remain in effect (meaning this test can be used) for the duration of the COVID-19 declaration under Section 564(b)(1) of  the Act, 21 U.S.C. section 360bbb-3(b)(1), unless the authorization is terminated or revoked.  Performed at [facility], [place] [address], [place], [place] [address]   Culture, blood (routine x 2)     Status: None   Collection Time: 11/06/20  9:40 PM   Specimen: BLOOD  Result Value Ref Range Status   Specimen Description BLOOD SITE NOT SPECIFIED  Final   Special Requests   Final    BOTTLES DRAWN AEROBIC AND ANAEROBIC Blood Culture adequate volume   Culture   Final    NO GROWTH 5 DAYS Performed at [facility], [address] [place], [place] [address]    Report Status 11/11/2020 FINAL  Final  Culture, blood (routine x 2)     Status: None   Collection Time: 11/06/20 10:20 PM   Specimen: BLOOD LEFT HAND  Result Value Ref Range Status   Specimen Description BLOOD LEFT HAND [employer] VANC  Final   Special Requests   Final    BOTTLES DRAWN AEROBIC AND ANAEROBIC Blood Culture adequate volume   Culture   Final    NO GROWTH 5 DAYS Performed at [facility], [place] [address] [place], [place] [address]    Report Status 11/12/2020 FINAL  Final  MRSA PCR Screening     Status: Abnormal   Collection Time: 11/07/20  5:35 AM   Specimen: Nasopharyngeal  Result Value Ref Range Status   MRSA by PCR POSITIVE (A) NEGATIVE Final    Comment:        The GeneXpert MRSA Assay (FDA approved for NASAL specimens only), is one component of a comprehensive MRSA colonization surveillance program. It is not intended to diagnose MRSA infection nor to guide or monitor treatment for MRSA infections. RESULT CALLED TO, READ BACK BY AND VERIFIED WITH: Herschel Senegal RN 8:35 11/07/20 ([email or web address]) Performed at [facility], [place] [address], [place], [place] [address]   Expectorated sputum assessment w rflx to resp cult     Status: None   Collection Time: 11/10/20  8:42 AM   Specimen: Expectorated Sputum  Result Value Ref Range Status   Specimen Description EXPSU  Final   Special Requests NONE   Final   Sputum evaluation   Final    THIS SPECIMEN IS ACCEPTABLE FOR SPUTUM CULTURE Performed at [facility], [place] [address] [place], [place] [address]    Report Status 11/12/2020 FINAL  Final  Culture, respiratory     Status: None   Collection Time: 11/10/20  8:42 AM  Result Value Ref Range Status   Specimen Description EXPSU  Final   Special Requests NONE Reflexed from [id number]  Final   Gram Stain   Final    RARE WBC PRESENT, PREDOMINANTLY PMN NO ORGANISMS SEEN Performed at [facility], [place] [address] [place], [place] [address]    Culture RARE STAPHYLOCOCCUS AUREUS  Final   Report Status 11/14/2020 FINAL  Final   Organism ID, Bacteria STAPHYLOCOCCUS AUREUS  Final      Susceptibility   Staphylococcus aureus - MIC*    CIPROFLOXACIN >=8 RESISTANT Resistant     ERYTHROMYCIN >=8 RESISTANT Resistant     GENTAMICIN <=0.5 SENSITIVE Sensitive     OXACILLIN <=0.25 SENSITIVE Sensitive     TETRACYCLINE >=16 RESISTANT Resistant     VANCOMYCIN <=0.5 SENSITIVE Sensitive     TRIMETH/SULFA <=10 SENSITIVE Sensitive     CLINDAMYCIN >=8 RESISTANT Resistant     RIFAMPIN <=0.5 SENSITIVE Sensitive     Inducible Clindamycin NEGATIVE Sensitive     * RARE STAPHYLOCOCCUS AUREUS     Time coordinating discharge: Over 30 minutes  SIGNED:   Yaakov Guthrie, MD  [facility] 11/14/2020, 3:59 PM Pager on amion  If 7PM-7AM, please contact night-coverage [email or web address] Password [email or web address]

## 2020-11-14 NOTE — TOC Transition Note (Signed)
Transition of Care [facility]) - CM/SW Discharge Note   Patient Details  Name: Megan [name] MRN: [id number] Date of Birth: [birth year]  Transition of Care [facility]) CM/SW Contact:  Megan Chars, LCSW Phone Number: 11/14/2020, 3:41 PM   Clinical Narrative: Pt discharging home with [facility]/Hospice.  DME through [facility].  Meds from [facility].  RN please call daughter Megan [name] at [phone] when we have better idea of when pt will be on the way.  PTAR will need to be called when pt is ready: [phone].      Final next level of care: Home w Hospice Care Barriers to Discharge: Barriers Resolved   Patient Goals and CMS Choice Patient states their goals for this hospitalization and ongoing recovery are:: "get back to the best of my abilities" CMS Medicare.gov Compare Post Acute Care list provided to:: Patient Choice offered to / list presented to : Patient  Discharge Placement                Patient to be transferred to facility by: [facility] Name of family member notified: Megan [name], Daughter Patient and family notified of of transfer: 11/14/20  Discharge Plan and Services     Post Acute Care Choice: [place]          DME Arranged:  (DME handled by Lonia Chimera)         HH Arranged:  Lonia Chimera) [place]: Hospice and [place] Date [facility] Agency Contacted: 11/14/20 Time HH Agency Contacted: 0830 Representative spoke with at [place]: [place] ([place]) Interventions     Readmission Risk Interventions No flowsheet data found.

## 2020-11-14 NOTE — Progress Notes (Signed)
Pt being d/c'd home with family. Meds delivered by pharmacy and d/c paperwork being sent home to daughter. Pt unable to participate in d/c teaching due to mental status. Transportation called and supposed to arrive at 1730.
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
## 2020-11-14 NOTE — Telephone Encounter (Signed)
Spoke to [facility] for Mother's transportation consent.   Sofiah Lyne DOB: 1951-02-15 MRN: [id number]   RIDER WAIVER AND RELEASE OF LIABILITY  For purposes of improving physical access to our facilities, [facility] is pleased to partner with third parties to provide [facility] patients or other authorized individuals the option of convenient, on-demand ground transportation services (the [employer]) through use of the technology service that enables users to request on-demand ground transportation from independent third-party providers.  By opting to use and accept these [employer], I, the undersigned, hereby agree on behalf of myself, and on behalf of any minor child using the [employer] for whom I am the parent or legal guardian, as follows:  1. Government social research officer provided to me are provided by independent third-party transportation providers who are not [employer] or employees and who are unaffiliated with [employer]. 2. [facility] is neither a transportation carrier nor a common or public carrier. 3. [facility] has no control over the quality or safety of the transportation that occurs as a result of the [employer]. 4. [facility] cannot guarantee that any third-party transportation provider will complete any arranged transportation service. 5. [facility] makes no representation, warranty, or guarantee regarding the reliability, timeliness, quality, safety, suitability, or availability of any of the Transport Services or that they will be error free. 6. I fully understand that traveling by vehicle involves risks and dangers of serious bodily injury, including permanent disability, paralysis, and death. I agree, on behalf of myself and on behalf of any minor child using the Transport Services for whom I am the parent or legal guardian, that the entire risk arising out of my use of the [employer] remains solely with me, to the maximum extent  permitted under applicable law. 7. The [employer] are provided as is and as available. [facility] disclaims all representations and warranties, express, implied or statutory, not expressly set out in these terms, including the implied warranties of merchantability and fitness for a particular purpose. 8. I hereby waive and release [facility], its agents, employees, officers, directors, representatives, insurers, attorneys, assigns, successors, subsidiaries, and affiliates from any and all past, present, or future claims, demands, liabilities, actions, causes of action, or suits of any kind directly or indirectly arising from acceptance and use of the [employer]. 9. I further waive and release [facility] and its affiliates from all present and future liability and responsibility for any injury or death to persons or damages to property caused by or related to the use of the [employer]. 10. I have read this Waiver and Release of Liability, and I understand the terms used in it and their legal significance. This Waiver is freely and voluntarily given with the understanding that my right (as well as the right of any minor child for whom I am the parent or legal guardian using the [employer]) to legal recourse against [facility] in connection with the [employer] is knowingly surrendered in return for use of these services.   I attest that I read the consent document to Marveen Reeks, gave Ms. Caicedo the opportunity to ask questions and answered the questions asked (if any). I affirm that Oliver Neuwirth then provided consent for she's participation in this program.     Cameron Proud

## 2020-11-14 NOTE — Telephone Encounter (Signed)
Called [facility] and spoke with Crystal letting her know that [facility] will be pt's hospice attending and she verbalized understanding. Nothing further needed.

## 2020-11-14 NOTE — Telephone Encounter (Signed)
Yes I will

## 2020-11-15 DIAGNOSIS — J439 Emphysema, unspecified: Secondary | ICD-10-CM | POA: Diagnosis not present

## 2020-11-15 DIAGNOSIS — J189 Pneumonia, unspecified organism: Secondary | ICD-10-CM | POA: Diagnosis not present

## 2020-11-15 DIAGNOSIS — Z8616 Personal history of COVID-19: Secondary | ICD-10-CM | POA: Diagnosis not present

## 2020-11-15 DIAGNOSIS — Z741 Need for assistance with personal care: Secondary | ICD-10-CM | POA: Diagnosis not present

## 2020-11-15 DIAGNOSIS — F418 Other specified anxiety disorders: Secondary | ICD-10-CM | POA: Diagnosis not present

## 2020-11-15 DIAGNOSIS — I509 Heart failure, unspecified: Secondary | ICD-10-CM | POA: Diagnosis not present

## 2020-11-15 DIAGNOSIS — R000 Tachycardia, unspecified: Secondary | ICD-10-CM | POA: Diagnosis not present

## 2020-11-15 DIAGNOSIS — E099 Drug or chemical induced diabetes mellitus without complications: Secondary | ICD-10-CM | POA: Diagnosis not present

## 2020-11-15 DIAGNOSIS — A419 Sepsis, unspecified organism: Secondary | ICD-10-CM | POA: Diagnosis not present

## 2020-11-15 DIAGNOSIS — J9621 Acute and chronic respiratory failure with hypoxia: Secondary | ICD-10-CM | POA: Diagnosis not present

## 2020-11-15 DIAGNOSIS — I252 Old myocardial infarction: Secondary | ICD-10-CM | POA: Diagnosis not present

## 2020-11-15 DIAGNOSIS — Z9981 Dependence on supplemental oxygen: Secondary | ICD-10-CM | POA: Diagnosis not present

## 2020-11-15 DIAGNOSIS — R159 Full incontinence of feces: Secondary | ICD-10-CM | POA: Diagnosis not present

## 2020-11-15 DIAGNOSIS — J690 Pneumonitis due to inhalation of food and vomit: Secondary | ICD-10-CM | POA: Diagnosis not present

## 2020-11-15 DIAGNOSIS — T380X5D Adverse effect of glucocorticoids and synthetic analogues, subsequent encounter: Secondary | ICD-10-CM | POA: Diagnosis not present

## 2020-11-15 DIAGNOSIS — K219 Gastro-esophageal reflux disease without esophagitis: Secondary | ICD-10-CM | POA: Diagnosis not present

## 2020-11-15 DIAGNOSIS — R1310 Dysphagia, unspecified: Secondary | ICD-10-CM | POA: Diagnosis not present

## 2020-11-15 DIAGNOSIS — Z681 Body mass index (BMI) 19 or less, adult: Secondary | ICD-10-CM | POA: Diagnosis not present

## 2020-11-15 DIAGNOSIS — I272 Pulmonary hypertension, unspecified: Secondary | ICD-10-CM | POA: Diagnosis not present

## 2020-11-15 DIAGNOSIS — R32 Unspecified urinary incontinence: Secondary | ICD-10-CM | POA: Diagnosis not present

## 2020-11-17 DIAGNOSIS — Z8616 Personal history of COVID-19: Secondary | ICD-10-CM | POA: Diagnosis not present

## 2020-11-17 DIAGNOSIS — I272 Pulmonary hypertension, unspecified: Secondary | ICD-10-CM | POA: Diagnosis not present

## 2020-11-17 DIAGNOSIS — J439 Emphysema, unspecified: Secondary | ICD-10-CM | POA: Diagnosis not present

## 2020-11-17 DIAGNOSIS — J189 Pneumonia, unspecified organism: Secondary | ICD-10-CM | POA: Diagnosis not present

## 2020-11-17 DIAGNOSIS — J690 Pneumonitis due to inhalation of food and vomit: Secondary | ICD-10-CM | POA: Diagnosis not present

## 2020-11-17 DIAGNOSIS — A419 Sepsis, unspecified organism: Secondary | ICD-10-CM | POA: Diagnosis not present

## 2020-12-02 DEATH — deceased

## 2024-05-29 NOTE — Progress Notes (Signed)
 This encounter was created in error - please disregard.
